# Patient Record
Sex: Male | Born: 1945 | Race: Black or African American | Hispanic: No | Marital: Married | Smoking: Former smoker
Health system: Southern US, Community
[De-identification: ages and names within clinical notes are randomized; demographics above are authoritative.]

## PROBLEM LIST (undated history)

## (undated) DIAGNOSIS — I6522 Occlusion and stenosis of left carotid artery: Secondary | ICD-10-CM

## (undated) DIAGNOSIS — Z85038 Personal history of other malignant neoplasm of large intestine: Secondary | ICD-10-CM

## (undated) DIAGNOSIS — G459 Transient cerebral ischemic attack, unspecified: Secondary | ICD-10-CM

## (undated) DIAGNOSIS — Z9221 Personal history of antineoplastic chemotherapy: Secondary | ICD-10-CM

## (undated) DIAGNOSIS — C61 Malignant neoplasm of prostate: Secondary | ICD-10-CM

## (undated) DIAGNOSIS — E785 Hyperlipidemia, unspecified: Secondary | ICD-10-CM

## (undated) DIAGNOSIS — H409 Unspecified glaucoma: Secondary | ICD-10-CM

## (undated) DIAGNOSIS — K219 Gastro-esophageal reflux disease without esophagitis: Secondary | ICD-10-CM

## (undated) DIAGNOSIS — K5730 Diverticulosis of large intestine without perforation or abscess without bleeding: Secondary | ICD-10-CM

## (undated) DIAGNOSIS — I2510 Atherosclerotic heart disease of native coronary artery without angina pectoris: Secondary | ICD-10-CM

## (undated) DIAGNOSIS — M1712 Unilateral primary osteoarthritis, left knee: Secondary | ICD-10-CM

## (undated) DIAGNOSIS — I10 Essential (primary) hypertension: Secondary | ICD-10-CM

## (undated) DIAGNOSIS — Z923 Personal history of irradiation: Secondary | ICD-10-CM

## (undated) DIAGNOSIS — C189 Malignant neoplasm of colon, unspecified: Secondary | ICD-10-CM

## (undated) HISTORY — PX: COLONOSCOPY: SHX174

## (undated) HISTORY — DX: Hyperlipidemia, unspecified: E78.5

## (undated) HISTORY — DX: Personal history of other malignant neoplasm of large intestine: Z85.038

## (undated) HISTORY — DX: Gastro-esophageal reflux disease without esophagitis: K21.9

## (undated) HISTORY — DX: Unspecified glaucoma: H40.9

## (undated) HISTORY — DX: Personal history of irradiation: Z92.3

## (undated) HISTORY — PX: EUS: SHX5427

## (undated) HISTORY — DX: Essential (primary) hypertension: I10

## (undated) HISTORY — DX: Diverticulosis of large intestine without perforation or abscess without bleeding: K57.30

## (undated) HISTORY — DX: Unilateral primary osteoarthritis, left knee: M17.12

---

## 1998-03-20 DIAGNOSIS — Z9221 Personal history of antineoplastic chemotherapy: Secondary | ICD-10-CM

## 1998-03-20 DIAGNOSIS — C189 Malignant neoplasm of colon, unspecified: Secondary | ICD-10-CM

## 1998-03-20 HISTORY — DX: Personal history of antineoplastic chemotherapy: Z92.21

## 1998-03-20 HISTORY — PX: COLON SURGERY: SHX602

## 1998-03-20 HISTORY — DX: Malignant neoplasm of colon, unspecified: C18.9

## 1998-12-20 ENCOUNTER — Encounter: Payer: Self-pay | Admitting: Family Medicine

## 1998-12-20 ENCOUNTER — Ambulatory Visit (HOSPITAL_COMMUNITY): Admission: RE | Admit: 1998-12-20 | Discharge: 1998-12-20 | Payer: Self-pay | Admitting: Family Medicine

## 1998-12-22 ENCOUNTER — Encounter: Payer: Self-pay | Admitting: Surgery

## 1998-12-23 ENCOUNTER — Ambulatory Visit (HOSPITAL_COMMUNITY): Admission: RE | Admit: 1998-12-23 | Discharge: 1998-12-23 | Payer: Self-pay | Admitting: Family Medicine

## 1998-12-23 ENCOUNTER — Encounter: Payer: Self-pay | Admitting: Family Medicine

## 1998-12-27 ENCOUNTER — Inpatient Hospital Stay (HOSPITAL_COMMUNITY): Admission: RE | Admit: 1998-12-27 | Discharge: 1999-01-02 | Payer: Self-pay | Admitting: Surgery

## 1998-12-27 ENCOUNTER — Encounter (INDEPENDENT_AMBULATORY_CARE_PROVIDER_SITE_OTHER): Payer: Self-pay | Admitting: Specialist

## 1999-02-17 ENCOUNTER — Encounter: Payer: Self-pay | Admitting: Surgery

## 1999-02-17 ENCOUNTER — Ambulatory Visit (HOSPITAL_COMMUNITY): Admission: RE | Admit: 1999-02-17 | Discharge: 1999-02-17 | Payer: Self-pay | Admitting: Surgery

## 1999-08-19 ENCOUNTER — Ambulatory Visit (HOSPITAL_BASED_OUTPATIENT_CLINIC_OR_DEPARTMENT_OTHER): Admission: RE | Admit: 1999-08-19 | Discharge: 1999-08-19 | Payer: Self-pay | Admitting: Surgery

## 1999-11-15 ENCOUNTER — Encounter: Payer: Self-pay | Admitting: Family Medicine

## 1999-11-15 ENCOUNTER — Ambulatory Visit (HOSPITAL_COMMUNITY): Admission: RE | Admit: 1999-11-15 | Discharge: 1999-11-15 | Payer: Self-pay | Admitting: Family Medicine

## 2002-03-05 ENCOUNTER — Encounter: Payer: Self-pay | Admitting: Family Medicine

## 2002-03-05 ENCOUNTER — Ambulatory Visit (HOSPITAL_COMMUNITY): Admission: RE | Admit: 2002-03-05 | Discharge: 2002-03-05 | Payer: Self-pay | Admitting: Family Medicine

## 2004-05-09 ENCOUNTER — Ambulatory Visit: Payer: Self-pay | Admitting: Internal Medicine

## 2004-06-22 ENCOUNTER — Ambulatory Visit: Payer: Self-pay | Admitting: Internal Medicine

## 2004-10-04 ENCOUNTER — Ambulatory Visit: Payer: Self-pay | Admitting: Internal Medicine

## 2004-10-25 ENCOUNTER — Ambulatory Visit: Payer: Self-pay | Admitting: Internal Medicine

## 2005-02-20 ENCOUNTER — Ambulatory Visit: Payer: Self-pay | Admitting: Internal Medicine

## 2005-02-22 ENCOUNTER — Ambulatory Visit: Payer: Self-pay | Admitting: Internal Medicine

## 2005-03-20 HISTORY — PX: KNEE ARTHROSCOPY: SHX127

## 2005-04-27 ENCOUNTER — Ambulatory Visit: Payer: Self-pay | Admitting: Internal Medicine

## 2006-03-01 ENCOUNTER — Ambulatory Visit: Payer: Self-pay | Admitting: Internal Medicine

## 2006-04-24 ENCOUNTER — Ambulatory Visit: Payer: Self-pay | Admitting: Internal Medicine

## 2006-05-18 ENCOUNTER — Ambulatory Visit: Payer: Self-pay | Admitting: Internal Medicine

## 2006-06-04 ENCOUNTER — Ambulatory Visit: Payer: Self-pay | Admitting: Internal Medicine

## 2006-10-12 DIAGNOSIS — Z85038 Personal history of other malignant neoplasm of large intestine: Secondary | ICD-10-CM

## 2006-10-12 DIAGNOSIS — I10 Essential (primary) hypertension: Secondary | ICD-10-CM | POA: Insufficient documentation

## 2007-02-12 ENCOUNTER — Encounter: Payer: Self-pay | Admitting: Internal Medicine

## 2007-02-20 ENCOUNTER — Encounter: Payer: Self-pay | Admitting: Internal Medicine

## 2007-05-15 ENCOUNTER — Ambulatory Visit: Payer: Self-pay | Admitting: Internal Medicine

## 2007-05-15 DIAGNOSIS — E785 Hyperlipidemia, unspecified: Secondary | ICD-10-CM | POA: Insufficient documentation

## 2007-05-15 LAB — CONVERTED CEMR LAB
ALT: 16 units/L (ref 0–53)
Basophils Relative: 0.6 % (ref 0.0–1.0)
Bilirubin Urine: NEGATIVE
Bilirubin, Direct: 0.2 mg/dL (ref 0.0–0.3)
CO2: 33 meq/L — ABNORMAL HIGH (ref 19–32)
Calcium: 9.7 mg/dL (ref 8.4–10.5)
Cholesterol: 238 mg/dL (ref 0–200)
Eosinophils Relative: 3.4 % (ref 0.0–5.0)
GFR calc Af Amer: 79 mL/min
Glucose, Bld: 99 mg/dL (ref 70–99)
HDL: 33.7 mg/dL — ABNORMAL LOW (ref >39.0)
Hemoglobin, Urine: NEGATIVE
Ketones, ur: NEGATIVE mg/dL
Monocytes Absolute: 0.4 10*3/uL (ref 0.2–0.7)
Neutro Abs: 4.7 10*3/uL (ref 1.4–7.7)
Neutrophils Relative %: 60.5 % (ref 43.0–77.0)
Potassium: 3.7 meq/L (ref 3.5–5.1)
Sodium: 142 meq/L (ref 135–145)
TSH: 2.45 microintl units/mL (ref 0.35–5.50)
Total CHOL/HDL Ratio: 7.1
Total Protein: 6.9 g/dL (ref 6.0–8.3)
Triglycerides: 146 mg/dL (ref 0–149)
Urine Glucose: NEGATIVE mg/dL
Urobilinogen, UA: 0.2 (ref 0.0–1.0)
VLDL: 29 mg/dL (ref 0–40)
pH: 5.5 (ref 5.0–8.0)

## 2007-06-07 ENCOUNTER — Ambulatory Visit: Payer: Self-pay | Admitting: Internal Medicine

## 2007-06-18 ENCOUNTER — Encounter: Payer: Self-pay | Admitting: Internal Medicine

## 2007-06-18 ENCOUNTER — Ambulatory Visit: Payer: Self-pay | Admitting: Internal Medicine

## 2007-08-13 ENCOUNTER — Ambulatory Visit: Payer: Self-pay | Admitting: Internal Medicine

## 2007-08-13 DIAGNOSIS — J019 Acute sinusitis, unspecified: Secondary | ICD-10-CM

## 2007-09-09 ENCOUNTER — Ambulatory Visit: Payer: Self-pay | Admitting: Internal Medicine

## 2008-05-20 ENCOUNTER — Ambulatory Visit: Payer: Self-pay | Admitting: Internal Medicine

## 2008-05-20 DIAGNOSIS — J069 Acute upper respiratory infection, unspecified: Secondary | ICD-10-CM | POA: Insufficient documentation

## 2008-05-20 DIAGNOSIS — M25519 Pain in unspecified shoulder: Secondary | ICD-10-CM

## 2008-05-20 LAB — CONVERTED CEMR LAB
AST: 21 units/L (ref 0–37)
Albumin: 3.9 g/dL (ref 3.5–5.2)
Alkaline Phosphatase: 84 units/L (ref 39–117)
BUN: 17 mg/dL (ref 6–23)
Bacteria, UA: NEGATIVE
Bilirubin, Direct: 0.1 mg/dL (ref 0.0–0.3)
Chloride: 102 meq/L (ref 96–112)
Eosinophils Relative: 2.1 % (ref 0.0–5.0)
GFR calc non Af Amer: 72 mL/min
Glucose, Bld: 100 mg/dL — ABNORMAL HIGH (ref 70–99)
Lymphocytes Relative: 24.2 % (ref 12.0–46.0)
Monocytes Relative: 4.2 % (ref 3.0–12.0)
Mucus, UA: NEGATIVE
Neutrophils Relative %: 69.5 % (ref 43.0–77.0)
Nitrite: NEGATIVE
Platelets: 214 10*3/uL (ref 150–400)
Potassium: 3.3 meq/L — ABNORMAL LOW (ref 3.5–5.1)
Total Protein, Urine: NEGATIVE mg/dL
Total Protein: 8 g/dL (ref 6.0–8.3)
Urobilinogen, UA: 0.2 (ref 0.0–1.0)
VLDL: 16 mg/dL (ref 0–40)
WBC: 9 10*3/uL (ref 4.5–10.5)

## 2008-07-06 ENCOUNTER — Ambulatory Visit: Payer: Self-pay | Admitting: Internal Medicine

## 2008-07-06 DIAGNOSIS — M25559 Pain in unspecified hip: Secondary | ICD-10-CM

## 2008-07-06 DIAGNOSIS — M549 Dorsalgia, unspecified: Secondary | ICD-10-CM | POA: Insufficient documentation

## 2008-07-06 LAB — CONVERTED CEMR LAB
Albumin: 3.7 g/dL (ref 3.5–5.2)
Alkaline Phosphatase: 87 units/L (ref 39–117)
LDL Cholesterol: 97 mg/dL (ref 0–99)
Total CHOL/HDL Ratio: 4
Triglycerides: 94 mg/dL (ref 0.0–149.0)
VLDL: 18.8 mg/dL (ref 0.0–40.0)

## 2008-07-24 ENCOUNTER — Telehealth (INDEPENDENT_AMBULATORY_CARE_PROVIDER_SITE_OTHER): Payer: Self-pay | Admitting: *Deleted

## 2009-04-30 ENCOUNTER — Ambulatory Visit: Payer: Self-pay | Admitting: Internal Medicine

## 2009-04-30 DIAGNOSIS — R1011 Right upper quadrant pain: Secondary | ICD-10-CM

## 2009-05-04 ENCOUNTER — Encounter (INDEPENDENT_AMBULATORY_CARE_PROVIDER_SITE_OTHER): Payer: Self-pay | Admitting: *Deleted

## 2009-05-04 ENCOUNTER — Ambulatory Visit: Payer: Self-pay | Admitting: Internal Medicine

## 2009-05-04 DIAGNOSIS — K862 Cyst of pancreas: Secondary | ICD-10-CM | POA: Insufficient documentation

## 2009-05-04 DIAGNOSIS — K863 Pseudocyst of pancreas: Secondary | ICD-10-CM

## 2009-05-04 DIAGNOSIS — R1084 Generalized abdominal pain: Secondary | ICD-10-CM | POA: Insufficient documentation

## 2009-05-05 ENCOUNTER — Telehealth: Payer: Self-pay | Admitting: Internal Medicine

## 2009-05-05 LAB — CONVERTED CEMR LAB
ALT: 19 units/L (ref 0–53)
Albumin: 4.1 g/dL (ref 3.5–5.2)
Amylase: 51 units/L (ref 27–131)
Basophils Relative: 0.4 % (ref 0.0–3.0)
CO2: 33 meq/L — ABNORMAL HIGH (ref 19–32)
Chloride: 103 meq/L (ref 96–112)
Creatinine, Ser: 1.3 mg/dL (ref 0.4–1.5)
Eosinophils Absolute: 0.1 10*3/uL (ref 0.0–0.7)
Eosinophils Relative: 1.5 % (ref 0.0–5.0)
HCT: 47.4 % (ref 39.0–52.0)
Hemoglobin: 15.6 g/dL (ref 13.0–17.0)
Leukocytes, UA: NEGATIVE
Lymphs Abs: 2.4 10*3/uL (ref 0.7–4.0)
MCHC: 32.9 g/dL (ref 30.0–36.0)
MCV: 82.2 fL (ref 78.0–100.0)
Monocytes Absolute: 0.5 10*3/uL (ref 0.1–1.0)
Neutro Abs: 4.3 10*3/uL (ref 1.4–7.7)
Neutrophils Relative %: 59.3 % (ref 43.0–77.0)
Nitrite: NEGATIVE
Potassium: 3 meq/L — ABNORMAL LOW (ref 3.5–5.1)
RBC: 5.77 M/uL (ref 4.22–5.81)
Total Protein: 7.9 g/dL (ref 6.0–8.3)
WBC: 7.3 10*3/uL (ref 4.5–10.5)
pH: 6.5 (ref 5.0–8.0)

## 2009-05-07 ENCOUNTER — Telehealth: Payer: Self-pay | Admitting: Internal Medicine

## 2009-05-08 ENCOUNTER — Ambulatory Visit (HOSPITAL_COMMUNITY): Admission: RE | Admit: 2009-05-08 | Discharge: 2009-05-08 | Payer: Self-pay | Admitting: Internal Medicine

## 2009-05-11 ENCOUNTER — Telehealth: Payer: Self-pay | Admitting: Internal Medicine

## 2009-05-11 ENCOUNTER — Ambulatory Visit: Payer: Self-pay | Admitting: Internal Medicine

## 2009-05-11 DIAGNOSIS — B029 Zoster without complications: Secondary | ICD-10-CM | POA: Insufficient documentation

## 2009-05-12 ENCOUNTER — Telehealth (INDEPENDENT_AMBULATORY_CARE_PROVIDER_SITE_OTHER): Payer: Self-pay | Admitting: *Deleted

## 2009-05-13 ENCOUNTER — Telehealth: Payer: Self-pay | Admitting: Internal Medicine

## 2009-06-07 ENCOUNTER — Ambulatory Visit: Payer: Self-pay | Admitting: Internal Medicine

## 2009-06-08 ENCOUNTER — Ambulatory Visit: Payer: Self-pay | Admitting: Internal Medicine

## 2009-06-08 ENCOUNTER — Telehealth (INDEPENDENT_AMBULATORY_CARE_PROVIDER_SITE_OTHER): Payer: Self-pay | Admitting: *Deleted

## 2009-06-08 DIAGNOSIS — E876 Hypokalemia: Secondary | ICD-10-CM

## 2009-06-08 DIAGNOSIS — B0229 Other postherpetic nervous system involvement: Secondary | ICD-10-CM

## 2009-06-10 ENCOUNTER — Encounter (INDEPENDENT_AMBULATORY_CARE_PROVIDER_SITE_OTHER): Payer: Self-pay | Admitting: *Deleted

## 2009-06-10 ENCOUNTER — Telehealth (INDEPENDENT_AMBULATORY_CARE_PROVIDER_SITE_OTHER): Payer: Self-pay | Admitting: *Deleted

## 2009-06-17 ENCOUNTER — Ambulatory Visit: Payer: Self-pay | Admitting: Gastroenterology

## 2009-06-17 ENCOUNTER — Ambulatory Visit (HOSPITAL_COMMUNITY): Admission: RE | Admit: 2009-06-17 | Discharge: 2009-06-17 | Payer: Self-pay | Admitting: Gastroenterology

## 2009-06-18 ENCOUNTER — Encounter: Payer: Self-pay | Admitting: Gastroenterology

## 2010-03-25 ENCOUNTER — Telehealth: Payer: Self-pay | Admitting: Internal Medicine

## 2010-04-17 LAB — CONVERTED CEMR LAB
Cholesterol: 132 mg/dL (ref 0–200)
LDL Cholesterol: 71 mg/dL (ref 0–99)
PSA: 2.19 ng/mL (ref 0.10–4.00)
Triglycerides: 132 mg/dL (ref 0.0–149.0)
VLDL: 26.4 mg/dL (ref 0.0–40.0)

## 2010-04-19 NOTE — Assessment & Plan Note (Signed)
Summary: ABD PAIN, OFFERED SDA - REQ 2/11/CD   Vital Signs:  Patient profile:   65 year old male Height:      65.5 inches Weight:      271.50 pounds BMI:     44.65 O2 Sat:      97 % on Room air Temp:     97.5 degrees F oral Pulse rate:   77 / minute BP sitting:   128 / 62  (left arm) Cuff size:   large  Vitals Entered ByZella Ball Ewing (April 30, 2009 8:23 AM)  O2 Flow:  Room air  CC: abdominal pain, flu shot/RE   CC:  abdominal pain and flu shot/RE.  History of Present Illness: here with 1 wk pain to the irght side mid abd and RUQ, seems to radiate towards the back but not all the way, dull, moderate, lasts 4 to 5 hrs at a time;  thought it was gas and tried alka seltzer at [place] and not help;  no n/v, fever,  has been irregular more than usuall with decreased freq of BM but o/w does not think he has constipation;  no BRBPR;  no fever, wt loss, although appetite has been down - yesterday only had 2 pop sickles,  rare alcohol only but did have a few  beers at the super bowl party feb 6;  takes the nsaid on regular basis  - one per day;  eating or position change does not seem to make the pain better or worse; tramadol  in the AM seems to help such that pain to the abd only symptomatic later in theday;  still taking the statin every day - taken for over a yr iwthout previous GI complaints;  has not tried any other antacid;  last CT - cant remember the date;  has hx of colon cancer approx 100 yrs - s/p partial coloectomy.  last colonoscopy just mar 2010 - due for f/u 2013.  Still has GB, has diverticulsi of colon but no hx of diverticulitis.  Pt denies CP, sob, doe, wheezing, orthopnea, pnd, worsening LE edema, palps, dizziness or syncope  Pt denies new neuro symptoms such as headache, facial or extremity weakness   Problems Prior to Update: 1)  Abdominal Pain Right Upper Quadrant  (ICD-789.01) 2)  Hepatotoxicity, Drug-induced, Risk of  (ICD-V58.69) 3)  Hip Pain, Right   (ICD-719.45) 4)  Back Pain  (ICD-724.5) 5)  Shoulder Pain, Right  (ICD-719.41) 6)  Uri  (ICD-465.9) 7)  Preventive Health Care  (ICD-V70.0) 8)  Sinusitis- Acute-nos  (ICD-461.9) 9)  Preventive Health Care  (ICD-V70.0) 10)  Diverticulosis, Colon  (ICD-562.10) 11)  Hyperlipidemia  (ICD-272.4) 12)  Hypertension  (ICD-401.9) 13)  Colon Cancer, Hx of  (ICD-V10.05)  Medications Prior to Update: 1)  Diclofenac Sodium 75 Mg Tbec (Diclofenac Sodium) .... Take 1 Tab By Mouth Two Times A Day As Needed Pain 2)  Hydrochlorothiazide 25 Mg Tabs (Hydrochlorothiazide) .... Take 1 Tablet By Mouth Once A Day 3)  Pravachol 40 Mg  Tabs (Pravastatin Sodium) .... 2 By Mouth Once Daily 4)  Benazepril Hcl 40 Mg  Tabs (Benazepril Hcl) .Marland Kitchen.. 1po Once Daily 5)  Amlodipine Besylate 10 Mg  Tabs (Amlodipine Besylate) .Marland Kitchen.. 1po Once Daily 6)  Tramadol Hcl 50 Mg  Tabs (Tramadol Hcl) .Marland Kitchen.. 1 - 2 By Mouth Q 6 Hrs As Needed Pain 7)  Adult Aspirin Ec Low Strength 81 Mg Tbec (Aspirin) .Marland Kitchen.. 1 By Mouth Once Daily  Current Medications (  verified): 1)  Diclofenac Sodium 75 Mg Tbec (Diclofenac Sodium) .... Take 1 Tab By Mouth Two Times A Day As Needed Pain 2)  Hydrochlorothiazide 25 Mg Tabs (Hydrochlorothiazide) .... Take 1 Tablet By Mouth Once A Day 3)  Pravachol 40 Mg  Tabs (Pravastatin Sodium) .... 2 By Mouth Once Daily 4)  Benazepril Hcl 40 Mg  Tabs (Benazepril Hcl) .Marland Kitchen.. 1po Once Daily 5)  Amlodipine Besylate 10 Mg  Tabs (Amlodipine Besylate) .Marland Kitchen.. 1po Once Daily 6)  Tramadol Hcl 50 Mg  Tabs (Tramadol Hcl) .Marland Kitchen.. 1 - 2 By Mouth Q 6 Hrs As Needed Pain 7)  Adult Aspirin Ec Low Strength 81 Mg Tbec (Aspirin) .Marland Kitchen.. 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   Head:  normocephalic and atraumatic.   Eyes:  vision grossly intact, pupils equal, and pupils round.   Ears:  R ear normal and L ear normal.   Nose:  no external deformity and no nasal discharge.   Mouth:  no gingival abnormalities and pharynx pink and moist.   Neck:  supple and no masses.   Lungs:  normal respiratory effort and normal breath sounds.   Heart:  normal rate and regular rhythm.   Abdomen:  soft, non-tender, normal bowel sounds, no distention, no masses, no guarding, and no hepatomegaly.   Msk:  no joint tenderness and no joint swelling.   Extremities:  no edema, no erythema    Impression & Recommendations:  Problem # 1:  HYPERTENSION (ICD-401.9)  His updated medication list for this problem includes:    Hydrochlorothiazide 25 Mg Tabs (Hydrochlorothiazide) .Marland Kitchen... Take 1 tablet by mouth once a day    Benazepril Hcl 40 Mg Tabs (Benazepril hcl) .Marland Kitchen... 1po once daily    Amlodipine Besylate 10 Mg Tabs (Amlodipine besylate) .Marland Kitchen... 1po once daily  BP today: 128/62 Prior BP: 142/90 (07/06/2008)  Labs Reviewed: K+: 3.3 (05/20/2008) Creat: : 1.1 (05/20/2008)   Chol: 150 (07/06/2008)   HDL: 34.20 (07/06/2008)   LDL: 97 (07/06/2008)   TG: 94.0 (07/06/2008) stable overall by hx and exam, ok to continue meds/tx as is   Problem # 2:  ABDOMINAL PAIN RIGHT UPPER QUADRANT (ICD-789.01) and right mid side; exam completly benign, afebrile; has tolerated nsaid for many yrs well  - will cont as is for now;  I suspect some bowel irreg ? constipation with the tramadol - to hold the tramadol for now;  also try laxative, and also for trial will give sample PPI to  see if has nay improvement; consider CT and /or labs if does not improve in  3 to 4 days  Problem # 3:  COLON CANCER, HX OF (ICD-V10.05) consider ct for any persistent or worsening s/s  Complete Medication List: 1)  Diclofenac Sodium 75 Mg Tbec (Diclofenac sodium) .... Take 1 tab by mouth two times a day as needed pain 2)  Hydrochlorothiazide 25 Mg Tabs (Hydrochlorothiazide) .... Take 1 tablet by  mouth once a day 3)  Pravachol 40 Mg Tabs (Pravastatin sodium) .... 2 by mouth once daily 4)  Benazepril Hcl 40 Mg Tabs (Benazepril hcl) .Marland Kitchen.. 1po once daily 5)  Amlodipine Besylate 10 Mg Tabs (Amlodipine besylate) .Marland Kitchen.. 1po once daily 6)  Tramadol Hcl 50 Mg Tabs (Tramadol hcl) .Marland Kitchen.. 1 - 2 by mouth q 6 hrs as needed pain 7)  Adult Aspirin Ec Low Strength 81 Mg Tbec (Aspirin) .Marland Kitchen.. 1 by mouth once daily  Other Orders: Flu Vaccine 15yrs + (16109) Administration Flu vaccine - MCR (U0454)  Patient Instructions: 1)  please hold on taking the tramadol for a few days 2)  please try magnesium citrate - 1 bottle by mouth x 1 today for laxative trial 3)  please take the Prilosec 20 mg OTC sample for antacid trail 4)  please call in 3 to 4 days if not improved, as we will need to consider CT and blood work 5)  you had the flu shot today 6)  Please schedule a follow-up appointment in 1 month for your "yearly exam"     Flu Vaccine Consent Questions     Do you have a history of severe allergic reactions to this vaccine? no    Any prior history of allergic reactions to egg and/or gelatin? no    Do you have a sensitivity to the preservative Thimersol? no    Do you have a past history of Guillan-Barre Syndrome? no    Do you currently have an acute febrile illness? no    Have you ever had a severe reaction to latex? no    Vaccine information given and explained to patient? yes    Are you currently pregnant? no    Lot Number:[id number]   Exp Date:09/16/2009   Site Given  Left Deltoid IMflu [facility]  Screening2-CCC]

## 2010-04-19 NOTE — Progress Notes (Signed)
Summary: Abd MRI  Phone Note From Other Clinic   Caller: Receptionist [phone] Lurena Joiner Summary of Call: [facility] called stating that pt is scheduled for Abd MRI w/o contrast. Lurena Joiner says that MRI needs to be with and without contrast and a recent Creatinine level needs to be sent over as well. MRI says they will need a new order.  Initial call taken by: Margaret Pyle, CMA,  May 07, 2009 8:30 AM  Follow-up for Phone Call        just had recent labs so does not need to be done again - ok to send labs from feb 15 (to pcc's to handle);  i will change order for the MRI Follow-up by: Corwin Levins MD,  May 07, 2009 9:43 AM  Additional Follow-up for Phone Call Additional follow up Details #1::        faxed order and labs to [facility] [phone]  Additional Follow-up by: Shelbie Proctor,  May 07, 2009 11:36 AM    Additional Follow-up for Phone Call Additional follow up Details #2::    noted Follow-up by: Corwin Levins MD,  May 07, 2009 1:00 PM

## 2010-04-19 NOTE — Assessment & Plan Note (Signed)
Summary: YEARLY FU / MEDICARE / LABS AFTER /NWS  #   Vital Signs:  Patient profile:   65 year old male Height:      66 inches Weight:      269.38 pounds BMI:     43.64 O2 Sat:      97 % on Room air Temp:     97.5 degrees F oral Pulse rate:   83 / minute BP sitting:   140 / 60  (left arm) Cuff size:   large  Vitals Entered ByZella Ball Ewing (June 08, 2009 8:40 AM)  O2 Flow:  Room air  CC: Yearly/RE   Primary Care Provider:  Corwin Levins MD  CC:  Ulla Potash.  History of Present Illness: here to f/u - stopped the pravachol for a time, laxative seemed to help, so re-started the pravachol;  did take the K supp, but then stopped after "got all right."   May need GI procedure related to the ? pancreatic cyst;  still c/o pain to the PHN area  - has now been approx just over a month;  has  tried lyrica in the past but could not tolerate;  Pt denies CP, sob, doe, wheezing, orthopnea, pnd, worsening LE edema, palps, dizziness or syncope   Pt denies new neuro symptoms such as headache, facial or extremity weakness  hard to lose wt.  Has some fatigue, but no OSA symtpoms at this time  Here for wellness Diet: Heart Healthy or DM if diabetic Physical Activities: Sedentary Depression/mood screen: Negative Hearing: Intact bilateral 'for the most part" with slight loss only in one ear  Visual Acuity: Grossly normal, wears reading glasses, sees optho yearly  ADL's: Capable  Fall Risk: None Home Safety: Good End-of-Life Planning: Advance directive - Full code/I agree   Preventive Screening-Counseling & Management      Drug Use:  no.    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) 3)  Abdominal Pain, Generalized  (ICD-789.07) 4)  Abdominal Pain Right Upper Quadrant  (ICD-789.01) 5)  Hepatotoxicity, Drug-induced, Risk of  (ICD-V58.69) 6)  Hip Pain, Right  (ICD-719.45) 7)  Back Pain  (ICD-724.5) 8)  Shoulder Pain, Right   (ICD-719.41) 9)  Uri  (ICD-465.9) 10)  Preventive Health Care  (ICD-V70.0) 11)  Sinusitis- Acute-nos  (ICD-461.9) 12)  Preventive Health Care  (ICD-V70.0) 13)  Diverticulosis, Colon  (ICD-562.10) 14)  Hyperlipidemia  (ICD-272.4) 15)  Hypertension  (ICD-401.9) 16)  Colon Cancer, Hx of  (ICD-V10.05)  Medications Prior to Update: 1)  Diclofenac Sodium 75 Mg Tbec (Diclofenac Sodium) .... Take 1 Tab By Mouth Two Times A Day As Needed Pain 2)  Hydrochlorothiazide 25 Mg Tabs (Hydrochlorothiazide) .... Take 1 Tablet By Mouth Once A Day 3)  Pravachol 40 Mg  Tabs (Pravastatin Sodium) .... 2 By Mouth Once Daily 4)  Benazepril Hcl 40 Mg  Tabs (Benazepril Hcl) .Marland Kitchen.. 1po Once Daily 5)  Amlodipine Besylate 10 Mg  Tabs (Amlodipine Besylate) .Marland Kitchen.. 1po Once Daily 6)  Tramadol Hcl 50 Mg  Tabs (Tramadol Hcl) .Marland Kitchen.. 1 - 2 By Mouth Q 6 Hrs As Needed Pain 7)  Adult Aspirin Ec Low Strength 81 Mg Tbec (Aspirin) .Marland Kitchen.. 1 By Mouth Once Daily  Current Medications (verified): 1)  Diclofenac Sodium 75 Mg Tbec (Diclofenac Sodium) .... Take 1 Tab By Mouth Two Times A Day As Needed Pain 2)  Hydrochlorothiazide 25 Mg Tabs (Hydrochlorothiazide) .... Take 1 Tablet  By Mouth Once A Day 3)  Pravachol 40 Mg  Tabs (Pravastatin Sodium) .... 2 By Mouth Once Daily 4)  Benazepril Hcl 40 Mg  Tabs (Benazepril Hcl) .Marland Kitchen.. 1po Once Daily 5)  Amlodipine Besylate 10 Mg  Tabs (Amlodipine Besylate) .Marland Kitchen.. 1po Once Daily 6)  Tramadol Hcl 50 Mg  Tabs (Tramadol Hcl) .Marland Kitchen.. 1 - 2 By Mouth Q 6 Hrs As Needed Pain 7)  Adult Aspirin Ec Low Strength 81 Mg Tbec (Aspirin) .Marland Kitchen.. 1 By Mouth Once Daily 8)  Gabapentin 300 Mg Caps (Gabapentin) .Marland Kitchen.. 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 brother with lung cancer brother with DM mother had liver cancer grandmother with breast cancer  Social History: Reviewed history from 05/15/2007 and no changes required. 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         all otherwise negative per pt -    Physical Exam  General:  alert and overweight-appearing.   Head:  normocephalic and atraumatic.   Eyes:  vision grossly intact, pupils equal, and pupils round.   Ears:  R ear normal and L ear normal.   Nose:  no external deformity and no nasal discharge.   Mouth:  no gingival abnormalities and pharynx pink and moist.   Neck:  supple and no masses.   Lungs:  normal respiratory effort and normal breath sounds.   Heart:  normal rate and regular rhythm.   Abdomen:  soft, non-tender, and normal bowel sounds.   Msk:  no joint tenderness and no joint swelling.     Extremities:  no edema, no erythema  Neurologic:  cranial nerves II-XII intact and strength normal in all extremities.     Impression & Recommendations:  Problem # 1:  Preventive Health Care (ICD-V70.0)  Overall doing well, age appropriate education and counseling updated and referral for appropriate preventive services done unless declined, immunizations up to date or declined, diet counseling done if overweight, urged to quit smoking if smokes , most recent labs reviewed and current ordered if appropriate, ecg reviewed or declined (interpretation per ECG scanned in the EMR if done); information regarding Medicare Prevention requirements given if appropriate ; noted no AAA on recent CT in former male smoker > 60  Orders: First annual wellness visit with prevention plan  (Z6109)  Problem # 2:  HYPOKALEMIA (ICD-276.8) i suspect  may have been related to the GI problem recent, and likely to recur on the hctz, but hs ie  relucant to take more meds;  will follow for now, re-check K   Problem # 3:  POSTHERPETIC NEURALGIA (ICD-053.19)  ok for gabapentin asd  Orders: Prescription Created Electronically [phone])  Problem # 4:  HYPERTENSION (ICD-401.9)  His updated medication list for this problem includes:    Hydrochlorothiazide 25 Mg Tabs (Hydrochlorothiazide) .Marland Kitchen... Take 1 tablet by mouth once a day    Benazepril Hcl 40 Mg Tabs (Benazepril hcl) .Marland Kitchen... 1po once daily    Amlodipine Besylate 10 Mg Tabs (Amlodipine besylate) .Marland Kitchen... 1po once daily  BP today: 140/60 Prior BP: 130/68 (05/11/2009)  Labs Reviewed: K+: 3.0 (05/04/2009) Creat: : 1.3 (05/04/2009)   Chol: 150 (07/06/2008)   HDL: 34.20 (07/06/2008)   LDL: 97 (07/06/2008)   TG: 94.0 (07/06/2008)  Orders: EKG w/ Interpretation (93000)  Problem # 5:  HYPERLIPIDEMIA (ICD-272.4)  His updated medication list for this problem includes:    Pravachol 40 Mg Tabs (Pravastatin sodium) .Marland Kitchen... 2 by mouth once daily  Orders: TLB-Lipid Panel  (80061-LIPID)  Labs Reviewed: SGOT: 29 (05/04/2009)   SGPT: 19 (05/04/2009)   HDL:34.20 (07/06/2008), 38.5 (05/20/2008)  LDL:97 (07/06/2008), 143 (60/45/4098)  Chol:150 (07/06/2008), 198 (05/20/2008)  Trig:94.0 (07/06/2008), 81 (05/20/2008) stable overall by hx and exam, ok to continue meds/tx as is - due for lipids check, goal ldl less than 70, cont diet  Complete Medication List: 1)  Diclofenac Sodium 75 Mg Tbec (Diclofenac sodium) .... Take 1 tab by mouth two times a day as needed pain 2)  Hydrochlorothiazide 25 Mg Tabs (Hydrochlorothiazide) .... Take 1 tablet by mouth once a day 3)  Pravachol 40 Mg Tabs (Pravastatin sodium) .... 2 by mouth once daily 4)  Benazepril Hcl 40 Mg Tabs (Benazepril hcl) .Marland Kitchen.. 1po once daily 5)  Amlodipine Besylate 10 Mg Tabs (Amlodipine besylate) .Marland Kitchen.. 1po once daily 6)  Tramadol Hcl 50 Mg Tabs (Tramadol hcl) .Marland Kitchen.. 1 - 2 by mouth q 6 hrs as needed pain 7)  Adult Aspirin Ec Low Strength 81 Mg Tbec (Aspirin) .Marland Kitchen.. 1 by mouth once daily 8)  Gabapentin 300 Mg Caps (Gabapentin) .Marland Kitchen.. 1 by mouth three times a day  Other Orders: Radiology Referral (Radiology) TLB-PSA (Prostate Specific Antigen) (84153-PSA)  Patient Instructions: 1)  Please take all new medications as prescribed - start the gabapentin at one per night for 3 nights, then twice per day for 3 days, then three times per day after that, watching for sleepiness 2)  Please go to the Lab in the basement for your blood and/or urine tests today  - the PSA and lipids 3)  you had the pneumonia shot today 4)  Please schedule a follow-up appointment in 1 year or sooner if needed Prescriptions: GABAPENTIN 300 MG CAPS (GABAPENTIN) 1 by mouth three times a day  #90 x 5   Entered and Authorized by:   Corwin Levins MD   Signed by:   Corwin Levins MD on 06/08/2009   Method used:   Print then Give to Patient   RxID:   [id number]     Appended Document: Immunization Entry      Immunizations  Administered:  Pneumonia Vaccine:    Vaccine Type: Pneumovax    Site: right deltoid    Mfr: Merck    Dose: 0.5 ml    Route: IM    Given by: Zella Ball Ewing    Exp. Date: 11/01/2010  Lot #: [id number]    VIS given: 10/16/95 version given June 08, 2009.

## 2010-04-19 NOTE — Miscellaneous (Signed)
Summary: Orders Update   Clinical Lists Changes  Problems: Added new problem of NONSPCIFC ABN FINDING RAD & OTH EXAM LUNG FIELD (ICD-793.1) Orders: Added new Referral order of Radiology Referral (Radiology) - Signed

## 2010-04-19 NOTE — Progress Notes (Signed)
Summary: ALT RX  Phone Note Call from Patient   Complaint: Nausea/Vomiting/Diarrhea Summary of Call: Med given today is >100$. Patient is requesting less expensive alternative. (acyclovir 200mg  #30 or #90  is on 4$ [facility] list, is this an option?) Initial call taken by: Lamar Sprinkles, CMA,  May 11, 2009 3:47 PM  Follow-up for Phone Call        done Follow-up by: Etta Grandchild MD,  May 11, 2009 7:38 PM    New/Updated Medications: ACYCLOVIR 800 MG TABS (ACYCLOVIR) One by mouth three times a day for 10 days Prescriptions: ACYCLOVIR 800 MG TABS (ACYCLOVIR) One by mouth three times a day for 10 days  #30 x 1   Entered and Authorized by:   Etta Grandchild MD   Signed by:   Etta Grandchild MD on 05/11/2009   Method used:   Electronically to        [facility]  [address] [phone]* (retail)       [address]       [place], [place]  [address]       Ph: [phone]       Fax: [phone]   RxID:   [id number]

## 2010-04-19 NOTE — Progress Notes (Signed)
Summary: Change rx request  Phone Note Other Incoming   Summary of Call: Patient left message on triage that he went to pick up Valtrex and it will cost $271. The pharmacist told him that Acyclovir would be cheaper, and the patient would like to know if he can have an prescription for that instead. Please advise. Initial call taken by: Lucious Groves,  May 12, 2009 12:55 PM Summary of Call: Informed pt it was sent into pharmacy. Initial call taken by: Josph Macho RMA,  May 13, 2009 9:00 AM  Follow-up for Phone Call        already addressed feb 22 per dr Yetta Barre Follow-up by: Corwin Levins MD,  May 12, 2009 1:12 PM  Additional Follow-up for Phone Call Additional follow up Details #1::        left message on machine to call back to office. Additional Follow-up by: Lucious Groves,  May 12, 2009 2:28 PM

## 2010-04-19 NOTE — Assessment & Plan Note (Signed)
Summary: STOMACH PROBLEM STILL-STC   Vital Signs:  Patient profile:   65 year old male Height:      66 inches Weight:      267.75 pounds BMI:     43.37 O2 Sat:      98 % on Room air Temp:     97.6 degrees F oral Pulse rate:   64 / minute BP sitting:   122 / 80  (left arm) Cuff size:   large  Vitals Entered ByZella Ball Ewing (May 04, 2009 11:44 AM)  O2 Flow:  Room air CC: stomach problems/RE   CC:  stomach problems/RE.  History of Present Illness: here to f/u - laxative did seem to help, but still wtih marked discomfort essentially not resolved, moderate, "gas like" with loud bowel sounds,  decreased appetite and wakes him up night;  pain itself now located to the right flank area and radiating towards the right mid abd, still denies fever, n/v, change in bowel habits, last BM yesterday, passing gas but little per pt and may be decreased;  stil good compliacne with meds;  no new symptoms and Pt denies CP, sob, doe, wheezing, orthopnea, pnd, worsening LE edema, palps, dizziness or syncope   Pt denies new neuro symptoms such as headache, facial or extremity weakness   no blood but stool hard.  Denies new GU symptoms such as freq, urgency, blood.  He is very concerned with his hx of colon cancer  Problems Prior to Update: 1)  Abdominal Pain, Generalized  (ICD-789.07) 2)  Abdominal Pain Right Upper Quadrant  (ICD-789.01) 3)  Hepatotoxicity, Drug-induced, Risk of  (ICD-V58.69) 4)  Hip Pain, Right  (ICD-719.45) 5)  Back Pain  (ICD-724.5) 6)  Shoulder Pain, Right  (ICD-719.41) 7)  Uri  (ICD-465.9) 8)  Preventive Health Care  (ICD-V70.0) 9)  Sinusitis- Acute-nos  (ICD-461.9) 10)  Preventive Health Care  (ICD-V70.0) 11)  Diverticulosis, Colon  (ICD-562.10) 12)  Hyperlipidemia  (ICD-272.4) 13)  Hypertension  (ICD-401.9) 14)  Colon Cancer, Hx of  (ICD-V10.05)  Medications Prior to Update: 1)  Diclofenac Sodium 75 Mg Tbec (Diclofenac Sodium) .... Take 1 Tab By Mouth Two Times A Day  As Needed Pain 2)  Hydrochlorothiazide 25 Mg Tabs (Hydrochlorothiazide) .... Take 1 Tablet By Mouth Once A Day 3)  Pravachol 40 Mg  Tabs (Pravastatin Sodium) .... 2 By Mouth Once Daily 4)  Benazepril Hcl 40 Mg  Tabs (Benazepril Hcl) .Marland Kitchen.. 1po Once Daily 5)  Amlodipine Besylate 10 Mg  Tabs (Amlodipine Besylate) .Marland Kitchen.. 1po Once Daily 6)  Tramadol Hcl 50 Mg  Tabs (Tramadol Hcl) .Marland Kitchen.. 1 - 2 By Mouth Q 6 Hrs As Needed Pain 7)  Adult Aspirin Ec Low Strength 81 Mg Tbec (Aspirin) .Marland Kitchen.. 1 By Mouth Once Daily  Current Medications (verified): 1)  Diclofenac Sodium 75 Mg Tbec (Diclofenac Sodium) .... Take 1 Tab By Mouth Two Times A Day As Needed Pain 2)  Hydrochlorothiazide 25 Mg Tabs (Hydrochlorothiazide) .... Take 1 Tablet By Mouth Once A Day 3)  Pravachol 40 Mg  Tabs (Pravastatin Sodium) .... 2 By Mouth Once Daily 4)  Benazepril Hcl 40 Mg  Tabs (Benazepril Hcl) .Marland Kitchen.. 1po Once Daily 5)  Amlodipine Besylate 10 Mg  Tabs (Amlodipine Besylate) .Marland Kitchen.. 1po Once Daily 6)  Tramadol Hcl 50 Mg  Tabs (Tramadol Hcl) .Marland Kitchen.. 1 - 2 By Mouth Q 6 Hrs As Needed Pain 7)  Adult Aspirin Ec Low Strength 81 Mg Tbec (Aspirin) .Marland Kitchen.. 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   Head:  normocephalic and atraumatic.   Eyes:  vision grossly intact, pupils equal, and pupils round.   Ears:  R ear normal and L ear normal.   Nose:  no  external deformity and no nasal discharge.   Mouth:  no gingival abnormalities and pharynx pink and moist.   Neck:  supple and no masses.   Lungs:  normal respiratory effort and normal breath sounds.   Heart:  normal rate and regular rhythm.   Abdomen:  soft, non-tender, and normal bowel sounds.  , nondistaned, no flank tender Msk:  no flank or spinal tender or sweling or rash Extremities:  no edema, no erythema    Impression & Recommendations:  Problem # 1:  ABDOMINAL PAIN, GENERALIZED (ICD-789.07)  more gen'd today but mostly right flank with some radation to the right abd per pt, ? some improved with laxative, but persists overall- cant r/o renal stone, recurrent maliganancy, GB,  or other - to check labs, and CT with CM - , cont same meds for now, also refer GI; stop the pravachol for now in  case this is contributing to symptoms although it has not caused problem in the past  Orders: TLB-BMP (Basic Metabolic Panel-BMET) (80048-METABOL) TLB-CBC Platelet - w/Differential (85025-CBCD) TLB-Hepatic/Liver Function Pnl (80076-HEPATIC) TLB-Lipase (83690-LIPASE) TLB-Amylase (82150-AMYL) TLB-Udip w/ Micro (81001-URINE) Radiology Referral (Radiology) Gastroenterology Referral (GI)  Problem # 2:  HYPERTENSION (ICD-401.9)  His updated medication list for this problem includes:    Hydrochlorothiazide 25 Mg Tabs (Hydrochlorothiazide) .Marland Kitchen... Take 1 tablet by mouth once a day    Benazepril Hcl 40 Mg Tabs (Benazepril hcl) .Marland Kitchen... 1po once daily    Amlodipine Besylate 10 Mg Tabs (Amlodipine besylate) .Marland Kitchen... 1po once daily  BP today: 122/80 Prior BP: 128/62 (04/30/2009)  Labs Reviewed: K+: 3.3 (05/20/2008) Creat: : 1.1 (05/20/2008)   Chol: 150 (07/06/2008)   HDL: 34.20 (07/06/2008)   LDL: 97 (07/06/2008)   TG: 94.0 (07/06/2008) stable overall by hx and exam, ok to continue meds/tx as is   Complete Medication List: 1)  Diclofenac Sodium 75 Mg Tbec (Diclofenac sodium) .... Take 1 tab by mouth  two times a day as needed pain 2)  Hydrochlorothiazide 25 Mg Tabs (Hydrochlorothiazide) .... Take 1 tablet by mouth once a day 3)  Pravachol 40 Mg Tabs (Pravastatin sodium) .... 2 by mouth once daily 4)  Benazepril Hcl 40 Mg Tabs (Benazepril hcl) .Marland Kitchen.. 1po once daily 5)  Amlodipine Besylate 10 Mg Tabs (Amlodipine besylate) .Marland Kitchen.. 1po once daily 6)  Tramadol Hcl 50 Mg Tabs (Tramadol hcl) .Marland Kitchen.. 1 - 2 by mouth q 6 hrs as needed pain 7)  Adult Aspirin Ec Low Strength 81 Mg Tbec (Aspirin) .Marland Kitchen.. 1 by mouth once daily  Patient Instructions: 1)  stop the pravachol as this can sometimes lead to constipation 2)  Continue all previous medications as before this visit 3)  Please go to the Lab in the  basement for your blood and/or urine tests today  4)  You will be contacted about the referral(s) to: CT scan today, and GI referral

## 2010-04-19 NOTE — Assessment & Plan Note (Signed)
Summary: Pancreatic Lesion    History of Present Illness Visit Type: consult  Primary GI MD: Stan Head MD [facility] Primary Provider: Corwin Levins MD Requesting Provider: Corwin Levins MD Chief Complaint: RLQ abd pain, constipation, and bloating  History of Present Illness:   65 yo [place] man. Prior pain RLQ pain last month - resolved. He becam constipated and could not defecate. Dr. Jonny Ruiz rxed a laxative, whch moved his bowels but still had gas pain. Then was found to have zoster in area of pain. CT abd and pelvis demonstrated pancreatic cyst lesion, 1 cm, confirmed by MR.   GI Review of Systems    Reports abdominal pain and  bloating.     Location of  Abdominal pain: RLQ.    Denies acid reflux, belching, chest pain, dysphagia with liquids, dysphagia with solids, heartburn, loss of appetite, nausea, vomiting, vomiting blood, weight loss, and  weight gain.      Reports constipation.     Denies anal fissure, black tarry stools, change in bowel habit, diarrhea, diverticulosis, fecal incontinence, heme positive stool, hemorrhoids, irritable bowel syndrome, jaundice, light color stool, liver problems, rectal bleeding, and  rectal pain.    Current Medications (verified): 1)  Diclofenac Sodium 75 Mg Tbec (Diclofenac Sodium) .... Take 1 Tab By Mouth Two Times A Day As Needed Pain 2)  Hydrochlorothiazide 25 Mg Tabs (Hydrochlorothiazide) .... Take 1 Tablet By Mouth Once A Day 3)  Pravachol 40 Mg  Tabs (Pravastatin Sodium) .... 2 By Mouth Once Daily 4)  Benazepril Hcl 40 Mg  Tabs (Benazepril Hcl) .Marland Kitchen.. 1po Once Daily 5)  Amlodipine Besylate 10 Mg  Tabs (Amlodipine Besylate) .Marland Kitchen.. 1po Once Daily 6)  Tramadol Hcl 50 Mg  Tabs (Tramadol Hcl) .Marland Kitchen.. 1 - 2 By Mouth Q 6 Hrs As Needed Pain 7)  Adult Aspirin Ec Low Strength 81 Mg Tbec (Aspirin) .Marland Kitchen.. 1 By Mouth Once Daily  Allergies: 1)  * Lyrica  CT Abdomen/Pelvis  Procedure date:  05/03/2009  Findings:      IMPRESSION:   1.  No acute  abnormality on CT of the abdomen and pelvis. 2.  Small low attenuation structure within the pancreas of questionable significance.  Consider dedicated CT of the pancreas or MRI of the abdomen to assess further. 3.  No calcified gallstones are noted. 4.  Degenerative joint disease particularly involving the facet joints of L4-5 and L5-S1. 5.  The appendix and the terminal ileum appear normal.  MRI of Abdomen  Procedure date:  05/08/2009  Findings:       IMPRESSION:   1.  Small unilocular cystic lesion within the body the pancreas correlates to the abnormality on comparison CT.  While this may represents a benign cyst (potentially of prior pancreatitis origin), cannot exclude a small intraductal papillary mucinous tumor or a unilocular serous cystadenoma.  Recommend follow-up MRI without and with contrast in 6 months. 2.  No additional abnormality within the abdomen and other than simple right renal cyst.   Past History:  Past Medical History: Reviewed history from 05/15/2007 and no changes required. Colon cancer, hx of Hypertension Hyperlipidemia left knee djd Diverticulosis, colon  Past Surgical History: Reviewed history from 05/20/2008 and no changes required. s/p transverse colectomy 2000 s/p left knee arthroscopy 2007 - [facility]  Family History: Reviewed history from 05/15/2007 and no changes required. 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         Zoster - resolved All other ROS negative except as per HPI.   Vital Signs:  Patient profile:   65 year old male Height:      66 inches Weight:      268 pounds BMI:     43.41 BSA:     2.27 Pulse rate:   72 /  minute Pulse rhythm:   regular BP sitting:   132 / 80  (left arm) Cuff size:   regular  Vitals Entered By: Ok [name] CMA (June 07, 2009 2:58 PM)  Physical Exam  General:  obese.  NAD Eyes:  anicteric Lungs:  Clear throughout to auscultation. Heart:  Regular rate and rhythm; no murmurs, rubs,  or bruits. 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 Neoplasm is possible. No prior history of pancreatitis. Will ask Dr. Merleen Milliner review and we will then contact patient. Risks, benefits,and indications of endoscopic ultrasound and FNA were reviewed with the patient and all questions answered.  Patient Instructions: 1)  Please continue current medications.  2)  Dr. Leone Payor will speak with Dr. Christella Hartigan and we will call you later this week about further scheduling.   3)  The medication list was reviewed and reconciled.  All changed / newly prescribed medications were explained.  A complete medication list was provided to the patient / caregiver.  Appended Document: Pancreatic Lesion patty, can you arrange for Upper EUS, , radial +/- linear for pancreatic cyst.  Next available EUS thursday.  Appended Document: Pancreatic Lesion eus scheduled for 06/17/09

## 2010-04-19 NOTE — Letter (Signed)
Summary: New Patient letter  [facility]  [address] [place], [place] [address]   Phone: [phone]  Fax: [phone]       05/04/2009 MRN: [id number]  Aaron Moss [address] [place], [place]  [address]  Dear Aaron Moss,  Welcome to the Gastroenterology Division at [facility].    You are scheduled to see Dr.  Leone Payor on 06-07-09 at 2:45pm on the 3rd floor at [facility], [address]  We ask that you try to arrive at our office 15 minutes prior to your appointment time to allow for check-in.  We would like you to complete the enclosed self-administered evaluation form prior to your visit and bring it with you on the day of your appointment.  We will review it with you.  Also, please bring a complete list of all your medications or, if you prefer, bring the medication bottles and we will list them.  Please bring your insurance card so that we may make a copy of it.  If your insurance requires a referral to see a specialist, please bring your referral form from your primary care physician.  Co-payments are due at the time of your visit and may be paid by cash, check or credit card.     Your office visit will consist of a consult with your physician (includes a physical exam), any laboratory testing he/she may order, scheduling of any necessary diagnostic testing (e.g. x-ray, ultrasound, CT-scan), and scheduling of a procedure (e.g. Endoscopy, Colonoscopy) if required.  Please allow enough time on your schedule to allow for any/all of these possibilities.    If you cannot keep your appointment, please call [phone] to cancel or reschedule prior to your appointment date.  This allows [place] the opportunity to schedule an appointment for another patient in need of care.  If you do not cancel or reschedule by 5 p.m. the business day prior to your appointment date, you will be charged a $50.00 late cancellation/no-show fee.    Thank you for choosing  [facility] for your medical needs.  We appreciate the opportunity to care for you.  Please visit [place] at our website  to learn more about our practice.                     Sincerely,                                                             The Gastroenterology Division

## 2010-04-19 NOTE — Progress Notes (Signed)
Summary: EUS   Phone Note Outgoing Call Call back at [facility] Phone [phone]   Call placed by: Chales Abrahams CMA Duncan Dull),  June 10, 2009 1:51 PM Summary of Call: pt scheduled for EUS need to review meds and instruct pt.   Initial call taken by: Chales Abrahams CMA Duncan Dull),  June 10, 2009 1:52 PM  Follow-up for Phone Call        left message on machine to call back work number is incorrect pt has been retired for 5 years Chales Abrahams CMA Duncan Dull)  June 11, 2009 1:33 PM   Additional Follow-up for Phone Call Additional follow up Details #1::        Pt. contacted and given instructions for egd on 06/17/09. Additional Follow-up by: Teryl Lucy RN,  June 14, 2009 10:25 AM  New Problems: NONSPECIFIC ABN FINDING RAD & OTH EXAM GI TRACT (ICD-793.4)   New Problems: NONSPECIFIC ABN FINDING RAD & OTH EXAM GI TRACT (ICD-793.4)

## 2010-04-19 NOTE — Letter (Signed)
Summary: EGD Instructions  [facility]  [address] [place], [place] [address]   Phone: [phone]  Fax: [phone]       [name]    11-14-1945    MRN: [id number]       Procedure Day /Date:06/17/09       Arrival Time: 1045 am       Procedure Time:1145am     Location of Procedure:                     [facility] ( Outpatient Registration)    PREPARATION FOR ENDOSCOPY   On 06/17/09 THE DAY OF THE PROCEDURE:  1.   No solid foods, milk or milk products are allowed after midnight the night before your procedure.  2.   Do not drink anything colored red or purple.  Avoid juices with pulp.  No orange juice.  3.  You may drink clear liquids until 745 am, which is 4 hours before your procedure.                                                                                                CLEAR LIQUIDS INCLUDE: Water Jello Ice Popsicles Tea (sugar ok, no milk/cream) Powdered fruit flavored drinks Coffee (sugar ok, no milk/cream) Gatorade Juice: apple, white grape, white cranberry  Lemonade Clear bullion, consomm, broth Carbonated beverages (any kind) Strained chicken noodle soup Hard Candy   MEDICATION INSTRUCTIONS  Unless otherwise instructed, you should take regular prescription medications with a small sip of water as early as possible the morning of your procedure.             OTHER INSTRUCTIONS  You will need a responsible adult at least 67 years of age to accompany you and drive you home.   This person must remain in the waiting room during your procedure.  Wear loose fitting clothing that is easily removed.  Leave jewelry and other valuables at home.  However, you may wish to bring a book to read or an iPod/MP3 player to listen to music as you wait for your procedure to start.  Remove all body piercing jewelry and leave at home.  Total time from sign-in until discharge is approximately 2-3 hours.  You should go home  directly after your procedure and rest.  You can resume normal activities the day after your procedure.  The day of your procedure you should not:   Drive   Make legal decisions   Operate machinery   Drink alcohol   Return to work  You will receive specific instructions about eating, activities and medications before you leave.    The above instructions have been reviewed and explained to me by   Aaron Moss CMA Duncan Dull)  June 10, 2009 1:55 PM     I fully understand and can verbalize these instructions over the phone mailed to home Date 06/10/09

## 2010-04-19 NOTE — Assessment & Plan Note (Signed)
Summary: rash on stomach/john/cd   Vital Signs:  Patient profile:   65 year old male Height:      66 inches Weight:      263 pounds BMI:     42.60 O2 Sat:      96 % on Room air Temp:     97.6 degrees F oral Pulse rate:   60 / minute Pulse rhythm:   regular Resp:     16 per minute BP sitting:   130 / 68  (left arm) Cuff size:   large  Vitals Entered By: Rock Nephew CMA (May 11, 2009 11:16 AM)  Nutrition Counseling: Patient's BMI is greater than 25 and therefore counseled on weight management options.  O2 Flow:  Room air CC: painful, itchy ras on stomac and back x 1wk Is Patient Diabetic? No   Primary Care Provider:  Corwin Levins MD  CC:  painful and itchy ras on stomac and back x 1wk.  History of Present Illness: New to me he complains of a painful rash from right mid-back over the right flank and abd.  Preventive Screening-Counseling & Management  Alcohol-Tobacco     Alcohol drinks/day: 0     Smoking Status: quit  Clinical Review Panels:  Diabetes Management   Creatinine:  1.3 (05/04/2009)   Last Flu Vaccine:  Fluvax 3+ (04/30/2009)  CBC   WBC:  7.3 (05/04/2009)   RBC:  5.77 (05/04/2009)   Hgb:  15.6 (05/04/2009)   Hct:  47.4 (05/04/2009)   Platelets:  211.0 (05/04/2009)   MCV  82.2 (05/04/2009)   MCHC  32.9 (05/04/2009)   RDW  14.8 (05/04/2009)   PMN:  59.3 (05/04/2009)   Lymphs:  32.5 (05/04/2009)   Monos:  6.3 (05/04/2009)   Eosinophils:  1.5 (05/04/2009)   Basophil:  0.4 (05/04/2009)  Complete Metabolic Panel   Glucose:  88 (05/04/2009)   Sodium:  145 (05/04/2009)   Potassium:  3.0 (05/04/2009)   Chloride:  103 (05/04/2009)   CO2:  33 (05/04/2009)   BUN:  10 (05/04/2009)   Creatinine:  1.3 (05/04/2009)   Albumin:  4.1 (05/04/2009)   Total Protein:  7.9 (05/04/2009)   Calcium:  9.3 (05/04/2009)   Total Bili:  1.3 (05/04/2009)   Alk Phos:  90 (05/04/2009)   SGPT (ALT):  19 (05/04/2009)   SGOT (AST):  29 (05/04/2009)   Medications  Prior to Update: 1)  Diclofenac Sodium 75 Mg Tbec (Diclofenac Sodium) .... Take 1 Tab By Mouth Two Times A Day As Needed Pain 2)  Hydrochlorothiazide 25 Mg Tabs (Hydrochlorothiazide) .... Take 1 Tablet By Mouth Once A Day 3)  Pravachol 40 Mg  Tabs (Pravastatin Sodium) .... 2 By Mouth Once Daily 4)  Benazepril Hcl 40 Mg  Tabs (Benazepril Hcl) .Marland Kitchen.. 1po Once Daily 5)  Amlodipine Besylate 10 Mg  Tabs (Amlodipine Besylate) .Marland Kitchen.. 1po Once Daily 6)  Tramadol Hcl 50 Mg  Tabs (Tramadol Hcl) .Marland Kitchen.. 1 - 2 By Mouth Q 6 Hrs As Needed Pain 7)  Adult Aspirin Ec Low Strength 81 Mg Tbec (Aspirin) .Marland Kitchen.. 1 By Mouth Once Daily 8)  Klor-Con 10 10 Meq Cr-Tabs (Potassium Chloride) .Marland Kitchen.. 1 By Mouth Once Daily  Current Medications (verified): 1)  Diclofenac Sodium 75 Mg Tbec (Diclofenac Sodium) .... Take 1 Tab By Mouth Two Times A Day As Needed Pain 2)  Hydrochlorothiazide 25 Mg Tabs (Hydrochlorothiazide) .... Take 1 Tablet By Mouth Once A Day 3)  Pravachol 40 Mg  Tabs (Pravastatin Sodium) .... 2  By Mouth Once Daily 4)  Benazepril Hcl 40 Mg  Tabs (Benazepril Hcl) .Marland Kitchen.. 1po Once Daily 5)  Amlodipine Besylate 10 Mg  Tabs (Amlodipine Besylate) .Marland Kitchen.. 1po Once Daily 6)  Tramadol Hcl 50 Mg  Tabs (Tramadol Hcl) .Marland Kitchen.. 1 - 2 By Mouth Q 6 Hrs As Needed Pain 7)  Adult Aspirin Ec Low Strength 81 Mg Tbec (Aspirin) .Marland Kitchen.. 1 By Mouth Once Daily 8)  Klor-Con 10 10 Meq Cr-Tabs (Potassium Chloride) .Marland Kitchen.. 1 By Mouth Once Daily 9)  Lyrica 75 Mg Caps (Pregabalin) .... One By Mouth Three Times A Day As Needed For Pain From Shingles 10)  Valacyclovir Hcl 1 Gm Tabs (Valacyclovir Hcl) .... One By Mouth Three Times A Day For 10 Days  Allergies (verified): No Known Drug Allergies  Past History:  Past Medical History: Reviewed history from 05/15/2007 and no changes required. Colon cancer, hx of Hypertension Hyperlipidemia left knee djd Diverticulosis, colon  Past Surgical History: Reviewed history from 05/20/2008 and no changes required. s/p  transverse colectomy 2000 s/p left knee arthroscopy 2007 - [facility]  Family History: Reviewed history from 05/15/2007 and no changes required. brother with lung cancer brother with DM mother had liver cancer grandmother with breast cancer  Social History: Reviewed history from 05/15/2007 and no changes required. 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   General:  Denies chills, fatigue, fever, loss of appetite, malaise, sleep disorder, sweats, weakness, and weight loss.  Physical Exam  General:  alert, well-developed, well-nourished, well-hydrated, appropriate dress, cooperative to examination, good hygiene, and overweight-appearing.   Head:  normocephalic, atraumatic, no abnormalities observed, and no abnormalities palpated.   Mouth:  Oral mucosa and oropharynx without lesions or exudates.  Teeth in good repair. Neck:  supple, full ROM, no masses, no thyromegaly, no thyroid nodules or tenderness, no carotid bruits, and no cervical lymphadenopathy.   Lungs:  normal respiratory effort, no intercostal retractions, no accessory muscle use, normal breath sounds, no crackles, and no wheezes.   Heart:  normal rate, regular rhythm, no murmur, no gallop, no rub, and no JVD.   Abdomen:  soft, non-tender, normal bowel sounds, no distention, no masses, no guarding, no rigidity, no hepatomegaly, and no splenomegaly.   Msk:  No deformity or scoliosis noted of thoracic or lumbar spine.   Pulses:  R and L carotid,radial,femoral,dorsalis pedis and posterior tibial pulses are full and equal bilaterally Extremities:  No clubbing, cyanosis, edema, or deformity noted with normal full range of motion of all joints.   Neurologic:  No cranial nerve deficits noted. Station  and gait are normal. Plantar reflexes are down-going bilaterally. DTRs are symmetrical throughout. Sensory, motor and coordinative functions appear intact. Skin:  there are several groups of excoriated vesicles along the right T8 dermatome from posterior to anterior, there is some scabbing but no exudate, pustules, induration, fluctuance or other evidence of secondary infection Cervical Nodes:  no anterior cervical adenopathy and no posterior cervical adenopathy.   Axillary Nodes:  no R axillary adenopathy and no L axillary adenopathy.   Inguinal Nodes:  no R inguinal adenopathy and no L inguinal adenopathy.     Impression & Recommendations:  Problem # 1:  HERPES ZOSTER (ICD-053.9) Assessment New start valtrex for infection and lyrica for pain Orders: Prescription Created Electronically [phone])  Complete Medication  List: 1)  Diclofenac Sodium 75 Mg Tbec (Diclofenac sodium) .... Take 1 tab by mouth two times a day as needed pain 2)  Hydrochlorothiazide 25 Mg Tabs (Hydrochlorothiazide) .... Take 1 tablet by mouth once a day 3)  Pravachol 40 Mg Tabs (Pravastatin sodium) .... 2 by mouth once daily 4)  Benazepril Hcl 40 Mg Tabs (Benazepril hcl) .Marland Kitchen.. 1po once daily 5)  Amlodipine Besylate 10 Mg Tabs (Amlodipine besylate) .Marland Kitchen.. 1po once daily 6)  Tramadol Hcl 50 Mg Tabs (Tramadol hcl) .Marland Kitchen.. 1 - 2 by mouth q 6 hrs as needed pain 7)  Adult Aspirin Ec Low Strength 81 Mg Tbec (Aspirin) .Marland Kitchen.. 1 by mouth once daily 8)  Klor-con 10 10 Meq Cr-tabs (Potassium chloride) .Marland Kitchen.. 1 by mouth once daily 9)  Lyrica 75 Mg Caps (Pregabalin) .... One by mouth three times a day as needed for pain from shingles 10)  Valacyclovir Hcl 1 Gm Tabs (Valacyclovir hcl) .... One by mouth three times a day for 10 days  Patient Instructions: 1)  Please schedule a follow-up appointment in 2 weeks. Prescriptions: VALACYCLOVIR HCL 1 GM TABS (VALACYCLOVIR HCL) One by mouth three times a day for 10 days  #30 x 2   Entered and  Authorized by:   Etta Grandchild MD   Signed by:   Etta Grandchild MD on 05/11/2009   Method used:   Handwritten   RxID:   [id number] LYRICA 75 MG CAPS (PREGABALIN) One by mouth three times a day as needed for pain from shingles  #63 x 0   Entered and Authorized by:   Etta Grandchild MD   Signed by:   Etta Grandchild MD on 05/11/2009   Method used:   Samples Given   RxID:   [id number]

## 2010-04-19 NOTE — Progress Notes (Signed)
  Phone Note From Pharmacy   Summary of Call: pts stating the pharmacy told him that the RX has to be in 200mg  tablest for it to fall under the $4 cost? Patient would like a new script sent over? Please advise? Initial call taken by: Josph Macho RMA,  May 13, 2009 2:14 PM  Follow-up for Phone Call        needs to be more specific - all of his prescriptions are already tablets Follow-up by: Corwin Levins MD,  May 13, 2009 2:58 PM  Additional Follow-up for Phone Call Additional follow up Details #1::        Discussed with Dr. Jonny Ruiz for me to send in a prescription for ACYCLOVIR 200 mg 5 times a day for seven days. Additional Follow-up by: Josph Macho RMA,  May 13, 2009 3:09 PM    Additional Follow-up for Phone Call Additional follow up Details #2::    Informed pt. Follow-up by: Josph Macho RMA,  May 13, 2009 3:11 PM  New/Updated Medications: * ACYCLOVIR 200 MG TABS (ACYCLOVIR) One by mouth five times a day for 7 days Prescriptions: ACYCLOVIR 200 MG TABS (ACYCLOVIR) One by mouth five times a day for 7 days  #35 x 1   Entered by:   Josph Macho RMA   Authorized by:   Corwin Levins MD   Signed by:   Josph Macho RMA on 05/13/2009   Method used:   Faxed to ...       [facility] [address] [phone]* (retail)       [address]       [place], [place]  [address]       Ph: [phone]       Fax: [phone]   RxID:   [id number] ACYCLOVIR 200 MG TABS (ACYCLOVIR) One by mouth five times a day for 7 days  #35 x 1   Entered by:   Josph Macho RMA   Authorized by:   Corwin Levins MD   Signed by:   Josph Macho RMA on 05/13/2009   Method used:   Faxed to ...       [facility]  [address] [id number] (retail)       [address]       [place], [place]  [address]       Ph: [phone]       Fax: [phone]   RxID:   [id number]

## 2010-04-19 NOTE — Procedures (Signed)
Summary: Endoscopic Ultrasound  Patient: [name] Note: All result statuses are Final unless otherwise noted.  Tests: (1) Endoscopic Ultrasound (EUS)  EUS Endoscopic Ultrasound                             DONE     [facility]     [address] 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           MEDICATIONS:   Fentanyl 125 mcg IV, Versed 8 mg IV, cipro 400mg  IV           DESCRIPTION OF PROCEDURE:   After the risks, benefits, and     alternatives of the procedure were thoroughly explained, informed     consent was obtained.  The  endoscope was introduced through the     mouth  and advanced to the duodenum.     <<PROCEDUREIMAGES>>           Endoscopic findings (limited views with radial, linear echo     endoscope):     1. 2-3cm hiatal hernia     2. Otherwise normal esophagus, stomach, duodenum           EUS findings:     1. 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  The cyst has no     septea, no associated nodules or solid masses. The cyst was     completely aspirated with a single pass with a 22 guage [place]     [employer] FNA needle using color doppler to avoid significant     blood vessels.  This yielded about 1-2cc of thin, clear fluid     which was sent to [facility] for amylase, lipase and also to     cytology.     2. Otherwise pancreatic parenchyma was normal; no solid masses, no     signs of chronic pancreatitis.     3. Normal main pancreatic duct;  non-dilated.     4. CBD was normal, non-dilated and without filling defects.     5. No peripancreatic adenopathy.     6. Limited views of liver, spleen, portal and splenic vessels were     all normal.           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  Main duct IMPN, pseudocyst (previously was a     heavy drinker), serous cystadenoma are all possible etiologies.     Await final fluid analysis results.  Aaron Moss will complete 3 days of  cipro twice daily to help decrease risk of infection.           ______________________________     Aaron Fee, Aaron Moss           cc: Oliver Barre, Aaron Moss           n.     Rosalie Doctor:   Aaron Moss at 06/17/2009 01:02 PM           Shara Blazing, [id number]  Note: An exclamation mark (!) indicates a result that was not dispersed into the flowsheet. Document Creation Date: 06/17/2009 1:03 PM _______________________________________________________________________  (1) Order result status: Final Collection or observation date-time: 06/17/2009 12:51 Requested date-time:  Receipt date-time:  Reported date-time:  Referring Physician:   Ordering Physician: Rob Bunting [phone]) Specimen Source:  Source: Launa Grill Order Number: [id number] Lab site:   Appended Document: Endoscopic Ultrasound cytology showed no malignant cells, CEA was 105.4ng?mL (low), amylase was 4.4 (low).    Nothing concerning about the cyst fluid tests.  Aaron Moss should have MRI in 12 months and if no change in size at that point, no need for furhter dedicated follow up.  Patty, can you please call him, arrange follow up MRI in one year.  Appended Document: Endoscopic Ultrasound pt aware

## 2010-04-19 NOTE — Progress Notes (Signed)
Summary: Pain  Phone Note Call from Patient Call back at Home Phone [phone]   Caller: Patient Summary of Call: pt called stating that his pain has increased and he was advised by MD to call if this happened Initial call taken by: Margaret Pyle, CMA,  May 05, 2009 1:58 PM  Follow-up for Phone Call        CT is negative, ok to f/u with GI as planned;  I have nothing further to offer Follow-up by: Corwin Levins MD,  May 05, 2009 2:06 PM  Additional Follow-up for Phone Call Additional follow up Details #1::        pt informed of above Additional Follow-up by: Margaret Pyle, CMA,  May 05, 2009 2:14 PM

## 2010-04-19 NOTE — Miscellaneous (Signed)
Summary: [facility]   Imported By: Lester Marcellus 06/18/2009 08:28:15  _____________________________________________________________________  External Attachment:    Type:   Image     Comment:   External Document

## 2010-04-19 NOTE — Miscellaneous (Signed)
Summary: rx  Clinical Lists Changes  Medications: Added new medication of CIPROFLOXACIN HCL 500 MG  TABS (CIPROFLOXACIN HCL) Take 1 twice a day for 3 days - Signed Rx of CIPROFLOXACIN HCL 500 MG  TABS (CIPROFLOXACIN HCL) Take 1 twice a day for 3 days;  #6 x 0;  Signed;  Entered by: Rachael Fee MD;  Authorized by: Rachael Fee MD;  Method used: Print then Give to Patient    Prescriptions: CIPROFLOXACIN HCL 500 MG  TABS (CIPROFLOXACIN HCL) Take 1 twice a day for 3 days  #6 x 0   Entered and Authorized by:   Rachael Fee MD   Signed by:   Rachael Fee MD on 06/17/2009   Method used:   Print then Give to Patient   RxID:   [id number]

## 2010-04-19 NOTE — Progress Notes (Signed)
Summary: Aortic ultrasound  Phone Note Outgoing Call   Call placed by: Dagoberto Reef,  June 08, 2009 3:19 PM Summary of Call: Dr Jonny Ruiz, called pt to give him his appt date and time of 3/24/111 @ 11am ([facility]) . Patient said that he thought you was going to cancel aortic u/s because he told you he had a CT done. Please advise.    Thanks Initial call taken by: Dagoberto Reef,  June 08, 2009 3:23 PM  Follow-up for Phone Call        yes, sorry, I meant to cancel that   - done now Follow-up by: Corwin Levins MD,  June 08, 2009 4:50 PM  Additional Follow-up for Phone Call Additional follow up Details #1::        Patient notified referral was cancelled. Additional Follow-up by: Dagoberto Reef,  June 09, 2009 9:37 AM

## 2010-04-21 NOTE — Progress Notes (Signed)
  Phone Note Refill Request Message from:  Fax from Pharmacy on March 25, 2010 10:03 AM  Refills Requested: Medication #1:  HYDROCHLOROTHIAZIDE 25 MG TABS Take 1 tablet by mouth once a day   Dosage confirmed as above?Dosage Confirmed   Notes: [employer] Initial call taken by: Zella Ball Ewing CMA (AAMA),  March 25, 2010 10:04 AM    Prescriptions: HYDROCHLOROTHIAZIDE 25 MG TABS (HYDROCHLOROTHIAZIDE) Take 1 tablet by mouth once a day  #100 Each x 0   Entered by:   Scharlene Gloss CMA (AAMA)   Authorized by:   Corwin Levins MD   Signed by:   Scharlene Gloss CMA (AAMA) on 03/25/2010   Method used:   Faxed to ...       [facility] [address] [phone]* (retail)       [address]       [place], [place]  [address]       Ph: [phone]       Fax: [phone]   RxID:   [id number]

## 2010-04-25 ENCOUNTER — Other Ambulatory Visit: Payer: Self-pay | Admitting: Internal Medicine

## 2010-04-25 ENCOUNTER — Other Ambulatory Visit: Payer: Medicare Other

## 2010-04-25 ENCOUNTER — Encounter: Payer: Self-pay | Admitting: Internal Medicine

## 2010-04-25 ENCOUNTER — Ambulatory Visit: Payer: Self-pay | Admitting: Internal Medicine

## 2010-04-25 ENCOUNTER — Encounter (INDEPENDENT_AMBULATORY_CARE_PROVIDER_SITE_OTHER): Payer: Self-pay | Admitting: *Deleted

## 2010-04-25 ENCOUNTER — Ambulatory Visit (INDEPENDENT_AMBULATORY_CARE_PROVIDER_SITE_OTHER): Payer: Medicare Other | Admitting: Internal Medicine

## 2010-04-25 DIAGNOSIS — Z23 Encounter for immunization: Secondary | ICD-10-CM

## 2010-04-25 DIAGNOSIS — M25569 Pain in unspecified knee: Secondary | ICD-10-CM | POA: Insufficient documentation

## 2010-04-25 DIAGNOSIS — I10 Essential (primary) hypertension: Secondary | ICD-10-CM

## 2010-04-25 DIAGNOSIS — Z0000 Encounter for general adult medical examination without abnormal findings: Secondary | ICD-10-CM

## 2010-04-25 DIAGNOSIS — Z125 Encounter for screening for malignant neoplasm of prostate: Secondary | ICD-10-CM

## 2010-04-25 LAB — LIPID PANEL
Cholesterol: 142 mg/dL (ref 0–200)
LDL Cholesterol: 91 mg/dL (ref 0–99)
Total CHOL/HDL Ratio: 4
Triglycerides: 81 mg/dL (ref 0.0–149.0)

## 2010-04-25 LAB — URINALYSIS
Bilirubin Urine: NEGATIVE
Leukocytes, UA: NEGATIVE
Nitrite: NEGATIVE
Total Protein, Urine: NEGATIVE
pH: 5.5 (ref 5.0–8.0)

## 2010-04-25 LAB — CBC WITH DIFFERENTIAL/PLATELET
Basophils Relative: 0.3 % (ref 0.0–3.0)
Eosinophils Relative: 1.9 % (ref 0.0–5.0)
HCT: 44.6 % (ref 39.0–52.0)
Hemoglobin: 15.1 g/dL (ref 13.0–17.0)
Lymphs Abs: 1.7 10*3/uL (ref 0.7–4.0)
MCV: 81.8 fl (ref 78.0–100.0)
Monocytes Absolute: 0.4 10*3/uL (ref 0.1–1.0)
Monocytes Relative: 5.6 % (ref 3.0–12.0)
RBC: 5.46 Mil/uL (ref 4.22–5.81)
WBC: 7.8 10*3/uL (ref 4.5–10.5)

## 2010-04-25 LAB — BASIC METABOLIC PANEL
Chloride: 104 mEq/L (ref 96–112)
GFR: 72.89 mL/min (ref >60.00)
Potassium: 3.5 mEq/L (ref 3.5–5.1)
Sodium: 143 mEq/L (ref 135–145)

## 2010-04-25 LAB — TSH: TSH: 1.32 u[IU]/mL (ref 0.35–5.50)

## 2010-04-25 LAB — HEPATIC FUNCTION PANEL
ALT: 15 U/L (ref 0–53)
AST: 17 U/L (ref 0–37)
Total Protein: 7.2 g/dL (ref 6.0–8.3)

## 2010-05-05 NOTE — Assessment & Plan Note (Signed)
Summary: Aaron Moss   Vital Signs:  Patient profile:   65 year old male Height:      66 inches Weight:      263.50 pounds BMI:     42.68 O2 Sat:      96 % on Room air Temp:     97.9 degrees F oral Pulse rate:   75 / minute BP sitting:   138 / 74  (left arm) Cuff size:   large  Vitals Entered By: Margaret Pyle, CMA (April 25, 2010 9:19 AM)  O2 Flow:  Room air  CC: F/U LT knee    Primary Care Provider:  Corwin Levins MD  CC:  F/U LT knee .  History of Present Illness: here for f/u - overall doing ok;  Pt denies CP, worsening sob, doe, wheezing, orthopnea, pnd, worsening LE edema, palps, dizziness or syncope  Pt denies new neuro symptoms such as headache, facial or extremity weakness  Pt denies polydipsia, polyuria   Overall good compliance with meds, trying to follow low chol diet, wt stable, little excercise however.  No fever, wt loss, night sweats, loss of appetite or other constitutional symptoms  Overall good compliance with meds, and good tolerability.  Denies worsening depressive symptoms, suicidal ideation, or panic.   Pt states good ability with ADL's, low fall risk, home safety reviewed and adequate, no significant change in hearing or vision, trying to follow lower chol diet, and occasionally active only with regular excercise.   Also here to f/u left knee,  s/p left arthrosopci in the past, has had maybe 2 or 3 cortisone in the past htat helped, but none for about 2 yrs;  has been walking more about 5 days per wk but knee has been achin, sweling lately.  Peak wt has been about 291 in the past, now 263;  left knee aches daily, mild to mod at this point wth pain, has a click or even a catch with movin a certain way.   No giveways and no falls. Has last sen ortho > 3 yrs at [facility].   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) 9)  Abdominal Pain, Generalized  (ICD-789.07) 10)  Abdominal Pain Right Upper Quadrant  (ICD-789.01) 11)  Hepatotoxicity, Drug-induced, Risk of  (ICD-V58.69) 12)  Hip Pain, Right  (ICD-719.45) 13)  Back Pain  (ICD-724.5) 14)  Shoulder Pain, Right  (ICD-719.41) 15)  Uri  (ICD-465.9) 16)  Preventive Health Care  (ICD-V70.0) 17)  Sinusitis- Acute-nos  (ICD-461.9) 18)  Preventive Health Care  (ICD-V70.0) 19)  Diverticulosis, Colon  (ICD-562.10) 20)  Hyperlipidemia  (ICD-272.4) 21)  Hypertension  (ICD-401.9) 22)  Colon Cancer, Hx of  (ICD-V10.05)  Medications Prior to Update: 1)  Diclofenac Sodium 75 Mg Tbec (Diclofenac Sodium) .... Take 1 Tab By Mouth Two Times A Day As Needed Pain 2)  Hydrochlorothiazide 25 Mg Tabs (Hydrochlorothiazide) .... Take 1 Tablet By Mouth Once A Day 3)  Pravachol 40 Mg  Tabs (Pravastatin Sodium) .... 2 By Mouth Once Daily 4)  Benazepril Hcl 40 Mg  Tabs (Benazepril Hcl) .Marland Kitchen.. 1po Once Daily 5)  Amlodipine Besylate 10 Mg  Tabs (Amlodipine Besylate) .Marland Kitchen.. 1po Once Daily 6)  Tramadol Hcl 50 Mg  Tabs (Tramadol Hcl) .Marland Kitchen.. 1 - 2 By Mouth Q 6 Hrs As Needed Pain 7)  Adult Aspirin Ec Low Strength 81 Mg Tbec (Aspirin) .Marland Kitchen.. 1 By Mouth Once Daily 8)  Gabapentin 300 Mg Caps (Gabapentin) .Marland Kitchen.. 1 By Mouth Three Times A Day  Current Medications (verified): 1)  Diclofenac Sodium 75 Mg Tbec (Diclofenac Sodium) .... Take 1 Tab By Mouth Two Times A Day As Needed Pain 2)  Hydrochlorothiazide 25 Mg Tabs (Hydrochlorothiazide) .... Take 1 Tablet By Mouth Once A Day 3)  Pravachol 40 Mg  Tabs (Pravastatin Sodium) .... 2 By Mouth Once Daily 4)  Benazepril Hcl 40 Mg  Tabs (Benazepril Hcl) .Marland Kitchen.. 1po Once Daily 5)  Amlodipine Besylate 10 Mg  Tabs (Amlodipine Besylate) .Marland Kitchen.. 1po Once Daily 6)   Tramadol Hcl 50 Mg  Tabs (Tramadol Hcl) .Marland Kitchen.. 1 - 2 By Mouth Q 6 Hrs As Needed Pain 7)  Adult Aspirin Ec Low Strength 81 Mg Tbec (Aspirin) .Marland Kitchen.. 1 By Mouth Once Daily 8)  Gabapentin 300 Mg Caps (Gabapentin) .Marland Kitchen.. 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 Married Former Smoker - quit approx 1990, prior 1ppd x 45yrs Alcohol use-yes 3 children Drug use-no  Risk Factors: Alcohol Use: 0 (05/11/2009)  Risk Factors: Smoking Status: quit (05/11/2009)  Review of Systems  The patient denies anorexia, fever, vision loss, decreased hearing, hoarseness, chest pain, syncope, dyspnea on exertion, peripheral edema, prolonged cough, headaches, hemoptysis, abdominal pain, melena, hematochezia, severe indigestion/heartburn, hematuria, muscle weakness, suspicious skin lesions, transient blindness, depression, unusual weight change, abnormal bleeding, enlarged lymph nodes, and angioedema.         all otherwise negative per pt -    Physical Exam  General:  alert and overweight-appearing.   Head:  normocephalic and atraumatic.   Eyes:  vision grossly intact, pupils equal, and pupils round.   Ears:  R ear normal and L ear normal.   Nose:  no external deformity and no nasal discharge.   Mouth:  no gingival abnormalities and pharynx pink and moist.   Neck:  supple and no masses.   Lungs:  normal respiratory effort and normal breath sounds.   Heart:  normal rate and regular rhythm.     Abdomen:  soft, non-tender, and normal bowel sounds.   Msk:  no joint tenderness and no joint swelling. except for left knee trace effusion with crepitus and discomfort but FROM Extremities:  no edema, no erythema  Neurologic:  cranial nerves II-XII intact and strength normal in all extremities.     Impression & Recommendations:  Problem # 1:  Preventive Health Care (ICD-V70.0) Overall doing well, age appropriate education and counseling updated, referral for preventive services and immunizations addressed, dietary counseling and smoking status adressed , most recent labs reviewed I have personally reviewed and have noted 1.The patient's medical and social history 2.Their use of alcohol, tobacco or illicit drugs 3.Their current medications and supplements 4. Functional ability including ADL's, fall  risk, home safety risk, hearing & visual impairment  5.Diet and physical activities 6.Evidence for depression or mood disorders The patients weight, height, BMI  have been recorded in the chart I have made referrals, counseling and provided education to the patient based review of the above  Orders: TLB-BMP (Basic Metabolic Panel-BMET) (80048-METABOL) TLB-CBC Platelet - w/Differential (85025-CBCD) TLB-Hepatic/Liver Function Pnl (80076-HEPATIC) TLB-Lipid Panel (80061-LIPID) TLB-PSA (Prostate Specific Antigen) (84153-PSA) TLB-TSH (Thyroid Stimulating Hormone) (84443-TSH) TLB-Udip ONLY (81003-UDIP)  Problem # 2:  KNEE PAIN, LEFT (ICD-719.46)  His updated medication list for this problem includes:    Diclofenac Sodium 75 Mg Tbec (Diclofenac sodium) .Marland Kitchen... Take 1 tab by mouth two times a day as needed pain    Tramadol Hcl 50 Mg Tabs (Tramadol hcl) .Marland Kitchen... 1 - 2 by mouth q 6 hrs as needed pain    Adult Aspirin Ec Low Strength 81 Mg Tbec (Aspirin) .Marland Kitchen... 1 by mouth once daily ok to refer back to [place] [facility]  Orders: Orthopedic Surgeon Referral (Ortho Surgeon)  Problem # 3:  HYPERTENSION  (ICD-401.9)  His updated medication list for this problem includes:    Hydrochlorothiazide 25 Mg Tabs (Hydrochlorothiazide) .Marland Kitchen... Take 1 tablet by mouth once a day    Benazepril Hcl 40 Mg Tabs (Benazepril hcl) .Marland Kitchen... 1po once daily    Amlodipine Besylate 10 Mg Tabs (Amlodipine besylate) .Marland Kitchen... 1po once daily  BP today: 138/74 Prior BP: 140/60 (06/08/2009)  Labs Reviewed: K+: 3.0 (05/04/2009) Creat: : 1.3 (05/04/2009)   Chol: 132 (06/08/2009)   HDL: 34.90 (06/08/2009)   LDL: 71 (06/08/2009)   TG: 132.0 (06/08/2009) stable overall by hx and exam, ok to continue meds/tx as is   Complete Medication List: 1)  Diclofenac Sodium 75 Mg Tbec (Diclofenac sodium) .... Take 1 tab by mouth two times a day as needed pain 2)  Hydrochlorothiazide 25 Mg Tabs (Hydrochlorothiazide) .... Take 1 tablet by mouth once a day 3)  Pravachol 40 Mg Tabs (Pravastatin sodium) .... 2 by mouth once daily 4)  Benazepril Hcl 40 Mg Tabs (Benazepril hcl) .Marland Kitchen.. 1po once daily 5)  Amlodipine Besylate 10 Mg Tabs (Amlodipine besylate) .Marland Kitchen.. 1po once daily 6)  Tramadol Hcl 50 Mg Tabs (Tramadol hcl) .Marland Kitchen.. 1 - 2 by mouth q 6 hrs as needed pain 7)  Adult Aspirin Ec Low Strength 81 Mg Tbec (Aspirin) .Marland Kitchen.. 1 by mouth once daily 8)  Gabapentin 300 Mg Caps (Gabapentin) .Marland Kitchen.. 1 by mouth three times a day  Other Orders: Admin 1st Vaccine (16109) Flu Vaccine 1yrs + [phone])  Patient Instructions: 1)  You will be contacted about the referral(s) to: orthopedic 2)  You are due for followup colonscopy in April 2014 3)  You had the flu shot today 4)  Please go to the Lab in the basement for your blood and/or urine tests today 5)  Please call the number on the [facility] Card for results of your testing  6)  Please schedule a follow-up appointment in 1 year, or sooner if needed   Orders Added: 1)  Orthopedic Surgeon Referral [Ortho Surgeon] 2)  TLB-BMP (Basic Metabolic Panel-BMET) [80048-METABOL] 3)  TLB-CBC Platelet - w/Differential  [85025-CBCD] 4)  TLB-Hepatic/Liver Function Pnl [80076-HEPATIC] 5)  TLB-Lipid Panel [80061-LIPID] 6)  TLB-PSA (Prostate Specific Antigen) [84153-PSA] 7)  TLB-TSH (Thyroid Stimulating Hormone) [84443-TSH] 8)  TLB-Udip ONLY [81003-UDIP] 9)  Admin 1st Vaccine [90471] 10)  Flu Vaccine 40yrs + [90658] 11)  Est. Patient 65& > [99397]  B-PSA (Prostate Specific Antigen) [09811-BJY] 7)  TLB-TSH (Thyroid  Stimulating Hormone) [84443-TSH] 8)  TLB-Udip ONLY [81003-UDIP] 9)  Admin 1st Vaccine [90471] 10)  Flu Vaccine 90yrs + [04540]     Flu Vaccine Consent Questions     Do you have a history of severe allergic reactions to this vaccine? no    Any prior history of allergic reactions to egg and/or gelatin? no    Do you have a sensitivity to the preservative Thimersol? no    Do you have a past history of Guillan-Barre Syndrome? no    Do you currently have an acute febrile illness? no    Have you ever had a severe reaction to latex? no    Vaccine information given and explained to patient? yes    Are you currently pregnant? no    Lot Number:[id number]   Exp Date:09/17/2010   Site Given  Right Deltoid IM

## 2010-06-10 ENCOUNTER — Other Ambulatory Visit: Payer: Self-pay | Admitting: Internal Medicine

## 2010-06-10 NOTE — Telephone Encounter (Signed)
Done hardcopy to dahlia/LIM B  

## 2010-06-10 NOTE — Telephone Encounter (Signed)
Rx faxed to pharamacy 

## 2010-06-13 LAB — PANC CYST FLD ANLYS-PATHFNDR-TG

## 2010-06-28 ENCOUNTER — Telehealth: Payer: Self-pay

## 2010-06-28 DIAGNOSIS — K862 Cyst of pancreas: Secondary | ICD-10-CM

## 2010-06-28 NOTE — Telephone Encounter (Signed)
Message copied by Chales Abrahams on Tue Jun 28, 2010  1:36 PM ------      Message from: Chales Abrahams      Created: Tue May 24, 2010  1:35 PM       Needs repeat MRI

## 2010-06-28 NOTE — Telephone Encounter (Signed)
Called and advised pt it is time to have repeat MRI and he agreed to have it scheduled.  I will put in the order and schedule and call pt with the instructions date and time.  Pt scheduled for [facility] radiology on 07/04/10 8 am pt to arrive at 745 am nothing to eat or drink after midnight.

## 2010-07-04 ENCOUNTER — Ambulatory Visit (HOSPITAL_COMMUNITY)
Admission: RE | Admit: 2010-07-04 | Discharge: 2010-07-04 | Disposition: A | Discharge status: Home / Self Care | Payer: Medicare Other | Source: Ambulatory Visit | Attending: Gastroenterology | Admitting: Gastroenterology

## 2010-07-04 DIAGNOSIS — N281 Cyst of kidney, acquired: Secondary | ICD-10-CM | POA: Insufficient documentation

## 2010-07-04 DIAGNOSIS — K863 Pseudocyst of pancreas: Secondary | ICD-10-CM | POA: Insufficient documentation

## 2010-07-04 DIAGNOSIS — K862 Cyst of pancreas: Secondary | ICD-10-CM | POA: Insufficient documentation

## 2010-07-04 LAB — CREATININE, SERUM: GFR calc non Af Amer: 59 mL/min — ABNORMAL LOW (ref >60)

## 2010-07-04 MED ORDER — GADOBENATE DIMEGLUMINE 529 MG/ML IV SOLN
20.0000 mL | Freq: Once | INTRAVENOUS | Status: AC | PRN
  Administered 2010-07-04: 20 mL via INTRAVENOUS

## 2010-07-05 ENCOUNTER — Telehealth: Payer: Self-pay

## 2010-07-05 NOTE — Telephone Encounter (Signed)
Message copied by Chales Abrahams on Tue Jul 05, 2010  8:27 AM ------      Message from: Rob Bunting      Created: Tue Jul 05, 2010  7:43 AM                   Please call the patient.  MRI showed that cyst has not grown and may be a bit smaller compared to imaging a year ago.            FNA  During EUS last year:  cytology showed no malignant cells, CEA was 105.4ng?mL (low), amylase was 4.4 (low).              There is no need for further dedicated followup of this cyst given stability and reassuring fluid testing last year.

## 2010-07-05 NOTE — Progress Notes (Signed)
Pt aware.

## 2010-07-05 NOTE — Telephone Encounter (Signed)
Pt aware.

## 2010-07-11 ENCOUNTER — Other Ambulatory Visit: Payer: Self-pay | Admitting: Internal Medicine

## 2010-08-05 NOTE — Op Note (Signed)
[place]. [facility]  Patient:    Aaron Moss, Aaron Moss                      MRN: [id number] Proc. Date: 08/19/99 Adm. Date:  [id number] Disc. Date: [id number] Attending:  Charlton Haws                           Operative Report  ACCOUNT NUMBER:  [id number]  OFFICE MEDICAL RECORD NUMBER:  [facility] [phone]  PREOPERATIVE DIAGNOSIS:  Unneeded Port-A-Cath status post treatment for colon cancer.  POSTOPERATIVE DIAGNOSIS:  Unneeded Port-A-Cath status post treatment for colon cancer.  OPERATION:  Removal of Port-A-Cath.  SURGEON:  Currie Paris, M.D.  ANESTHESIA:  MAC.  CLINICAL HISTORY:  This patient has completed his chemotherapy and no longer needed his Port-A-Cath.  DESCRIPTION OF PROCEDURE:  The patient was brought to the operating room and given some IV sedation.  The area over the Port-A-Cath was prepped as a sterile field with Betadine and draped.  Xylocaine 1% mixed equally with 0.5% Marcaine with epinephrine was used for local and infiltrated around the Port-A-Cath.  About two thirds of the old scar was incised and the Port-A-Cath found in its pocket.  The holding sutures were cut and the catheter was backED up a little bit.  A 3-0 Vicryl was placed as a pursestring around the tract and then the Port-A-Cath tubing backed out completely.  The pursestring was tied down to prevent any back leak of blood.  The area appeared to be dry and was closed with 3-0 Vicryl followed by 4-0 Monocryl subcuticular plus Steri-Strips.  The patient tolerated the procedure well.  There were no operative complications.  All counts were correct. DD:  08/19/99 TD:  08/23/99 Job: [id number]

## 2010-08-09 ENCOUNTER — Other Ambulatory Visit: Payer: Self-pay | Admitting: Internal Medicine

## 2010-08-19 ENCOUNTER — Other Ambulatory Visit: Payer: Self-pay | Admitting: Internal Medicine

## 2010-09-10 ENCOUNTER — Other Ambulatory Visit: Payer: Self-pay | Admitting: Internal Medicine

## 2010-12-14 ENCOUNTER — Encounter: Payer: Self-pay | Admitting: Internal Medicine

## 2010-12-14 ENCOUNTER — Ambulatory Visit (INDEPENDENT_AMBULATORY_CARE_PROVIDER_SITE_OTHER): Payer: Medicare Other | Admitting: Internal Medicine

## 2010-12-14 VITALS — BP 122/64 | HR 78 | Temp 98.6°F | Ht 66.0 in | Wt 257.0 lb

## 2010-12-14 DIAGNOSIS — I10 Essential (primary) hypertension: Secondary | ICD-10-CM

## 2010-12-14 DIAGNOSIS — Z0001 Encounter for general adult medical examination with abnormal findings: Secondary | ICD-10-CM | POA: Insufficient documentation

## 2010-12-14 DIAGNOSIS — Z0000 Encounter for general adult medical examination without abnormal findings: Secondary | ICD-10-CM

## 2010-12-14 DIAGNOSIS — J209 Acute bronchitis, unspecified: Secondary | ICD-10-CM

## 2010-12-14 DIAGNOSIS — R062 Wheezing: Secondary | ICD-10-CM

## 2010-12-14 MED ORDER — PREDNISONE 10 MG PO TABS: 10.0000 mg | ORAL_TABLET | Freq: Every day | ORAL | Status: AC

## 2010-12-14 MED ORDER — HYDROCODONE-HOMATROPINE 5-1.5 MG/5ML PO SYRP: 5.0000 mL | ORAL_SOLUTION | Freq: Four times a day (QID) | ORAL | Status: AC | PRN

## 2010-12-14 MED ORDER — AZITHROMYCIN 250 MG PO TABS: ORAL_TABLET | ORAL | Status: AC

## 2010-12-14 NOTE — Progress Notes (Signed)
Subjective:    Patient ID: [name], male    DOB: [birth year], 65 y.o.   MRN: [id number]  HPI  Here with acute onset mild to mod 2-3 days ST, HA, general weakness and malaise, with prod cough greenish sputum, but Pt denies chest pain, increased sob or doe, wheezing, orthopnea, PND, increased LE swelling, palpitations, dizziness or syncope, until today with onset mild wheezing/sob as well.  Pt denies new neurological symptoms such as new headache, or facial or extremity weakness or numbness   Pt denies polydipsia, polyuria.   Pt denies fever, wt loss, night sweats, loss of appetite, or other constitutional symptoms, except for the above. Past Medical History  Diagnosis Date  . Hypertension   . Hyperlipidemia   . History of colon cancer   . Left knee DJD   . Diverticulosis of colon    Past Surgical History  Procedure Date  . Colon surgery 200  . Knee arthroscopy 2007    LT- [facility] [facility]    reports that he quit smoking about 22 years ago. His smoking use included Cigarettes. He smoked 1 pack per day. He does not have any smokeless tobacco history on file. He reports that he drinks alcohol. He reports that he does not use illicit drugs. family history includes Cancer in his brother, maternal grandmother, and mother and Diabetes in his brother. Allergies  Allergen Reactions  . Pregabalin     REACTION: felt bad   Current Outpatient Prescriptions on File Prior to Visit  Medication Sig Dispense Refill  . amLODipine (NORVASC) 10 MG tablet TAKE ONE TABLET BY MOUTH EVERY DAY  90 tablet  3  . aspirin EC 81 MG tablet Take 81 mg by mouth daily.        . benazepril (LOTENSIN) 40 MG tablet TAKE ONE TABLET BY MOUTH EVERY DAY  90 tablet  3  . diclofenac (VOLTAREN) 75 MG EC tablet TAKE ONE TABLET BY MOUTH TWICE DAILY AS NEEDED FOR PAIN  60 tablet  6  . hydrochlorothiazide 25 MG tablet TAKE ONE TABLET BY MOUTH EVERY DAY  100 tablet  3  . pravastatin (PRAVACHOL) 40 MG tablet TAKE TWO TABLETS BY MOUTH  EVERY DAY  180 tablet  3  . traMADol (ULTRAM) 50 MG tablet Take 1 tablet (50 mg total) by mouth every 6 (six) hours as needed for pain.  240 tablet  2   Review of Systems Review of Systems  Constitutional: Negative for diaphoresis and unexpected weight change.  HENT: Negative for drooling and tinnitus.   Eyes: Negative for photophobia and visual disturbance.  Respiratory: Negative for choking and stridor.   Gastrointestinal: Negative for vomiting and blood in stool.  Genitourinary: Negative for hematuria and decreased urine volume.    Objective:   Physical Exam BP 122/64  Pulse 78  Temp(Src) 98.6 F (37 C) (Oral)  Ht 5\' 6"  (1.676 m)  Wt 257 lb (116.574 kg)  BMI 41.48 kg/m2  SpO2 95% Physical Exam  VS noted, mild ill Constitutional: Pt appears well-developed and well-nourished.  HENT: Head: Normocephalic.  Right Ear: External ear normal.  Left Ear: External ear normal.  Bilat tm's mild erythema.  Sinus nontender.  Pharynx mild erythema Eyes: Conjunctivae and EOM are normal. Pupils are equal, round, and reactive to light.  Neck: Normal range of motion. Neck supple.  Cardiovascular: Normal rate and regular rhythm.   Pulmonary/Chest: Effort normal and breath sounds decreased bilat with mild wheeze.  Neurological: Pt is alert. No cranial nerve  deficit.  Skin: Skin is warm. No erythema.  Psychiatric: Pt behavior is normal. Thought content normal.     Assessment & Plan:

## 2010-12-14 NOTE — Assessment & Plan Note (Signed)
Mild to mod, for antibx course,  to f/u any worsening symptoms or concerns 

## 2010-12-14 NOTE — Assessment & Plan Note (Signed)
Mild to mod, for predpack asd,  to f/u any worsening symptoms or concerns 

## 2010-12-14 NOTE — Assessment & Plan Note (Signed)
stable overall by hx and exam, most recent data reviewed with pt, and pt to continue medical treatment as before  BP Readings from Last 3 Encounters:  12/14/10 122/64  04/25/10 138/74  06/08/09 140/60

## 2010-12-14 NOTE — Patient Instructions (Addendum)
Take all new medications as prescribed Continue all other medications as before Please return in 6 months, or sooner if needed 

## 2011-02-08 ENCOUNTER — Other Ambulatory Visit: Payer: Self-pay | Admitting: Internal Medicine

## 2011-02-08 NOTE — Telephone Encounter (Signed)
Done per emr 

## 2011-06-20 ENCOUNTER — Encounter: Payer: Self-pay | Admitting: Internal Medicine

## 2011-06-20 ENCOUNTER — Ambulatory Visit (INDEPENDENT_AMBULATORY_CARE_PROVIDER_SITE_OTHER): Payer: Medicare Other | Admitting: Internal Medicine

## 2011-06-20 VITALS — BP 140/68 | HR 74 | Temp 97.4°F | Ht 66.0 in | Wt 268.5 lb

## 2011-06-20 DIAGNOSIS — J069 Acute upper respiratory infection, unspecified: Secondary | ICD-10-CM

## 2011-06-20 DIAGNOSIS — N529 Male erectile dysfunction, unspecified: Secondary | ICD-10-CM | POA: Insufficient documentation

## 2011-06-20 DIAGNOSIS — I10 Essential (primary) hypertension: Secondary | ICD-10-CM

## 2011-06-20 MED ORDER — VARDENAFIL HCL 20 MG PO TABS: 20.0000 mg | ORAL_TABLET | ORAL | Status: DC | PRN

## 2011-06-20 MED ORDER — AZITHROMYCIN 250 MG PO TABS: ORAL_TABLET | ORAL | Status: AC

## 2011-06-20 NOTE — Patient Instructions (Signed)
Take all new medications as prescribed - the antibiotic (sent to the pharmacy), and the levitra given hardcopy (less expensive at [facility]) Continue all other medications as before You can also take Delsym OTC for cough, and/or Mucinex (or it's generic off brand) for congestion

## 2011-06-20 NOTE — Assessment & Plan Note (Signed)
Ok for levitra prn,  to f/u any worsening symptoms or concerns 

## 2011-06-20 NOTE — Assessment & Plan Note (Signed)
stable overall by hx and exam, most recent data reviewed with pt, and pt to continue medical treatment as before  BP Readings from Last 3 Encounters:  06/20/11 140/68  12/14/10 122/64  04/25/10 138/74

## 2011-06-20 NOTE — Assessment & Plan Note (Signed)
Mild to mod, for antibx course,  to f/u any worsening symptoms or concerns 

## 2011-06-20 NOTE — Progress Notes (Signed)
Subjective:    Patient ID: Aaron Moss, male    DOB: October 24, 1945, 66 y.o.   MRN: [id number]  HPI   Here with 2 days acute onset fever, severe ST, general weakness and malaise, but little to no cough and Pt denies chest pain, increased sob or doe, wheezing, orthopnea, PND, increased LE swelling, palpitations, dizziness or syncope.  Pt denies new neurological symptoms such as new headache, or facial or extremity weakness or numbness   Pt denies polydipsia, polyuria.   Pt denies fever, wt loss, night sweats, loss of appetite, or other constitutional symptoms.  Also with worsening ED symtpoms for the past 4-5 mo without med change.  Denies worsening depressive symptoms, suicidal ideation, or panic. Past Medical History  Diagnosis Date  . Hypertension   . Hyperlipidemia   . History of colon cancer   . Left knee DJD   . Diverticulosis of colon    Past Surgical History  Procedure Date  . Colon surgery 200  . Knee arthroscopy 2007    LT- [place] [facility]    reports that he quit smoking about 23 years ago. His smoking use included Cigarettes. He smoked 1 pack per day. He does not have any smokeless tobacco history on file. He reports that he drinks alcohol. He reports that he does not use illicit drugs. family history includes Cancer in his brother, maternal grandmother, and mother and Diabetes in his brother. Allergies  Allergen Reactions  . Pregabalin     REACTION: felt bad   Current Outpatient Prescriptions on File Prior to Visit  Medication Sig Dispense Refill  . amLODipine (NORVASC) 10 MG tablet TAKE ONE TABLET BY MOUTH EVERY DAY  90 tablet  3  . aspirin EC 81 MG tablet Take 81 mg by mouth daily.        . benazepril (LOTENSIN) 40 MG tablet TAKE ONE TABLET BY MOUTH EVERY DAY  90 tablet  3  . diclofenac (VOLTAREN) 75 MG EC tablet TAKE ONE TABLET BY MOUTH TWICE DAILY AS NEEDED FOR PAIN  60 tablet  6  . hydrochlorothiazide 25 MG tablet TAKE ONE TABLET BY MOUTH EVERY DAY  100 tablet  3  .  pravastatin (PRAVACHOL) 40 MG tablet TAKE TWO TABLETS BY MOUTH EVERY DAY  180 tablet  3  . traMADol (ULTRAM) 50 MG tablet TAKE ONE TABLET BY MOUTH EVERY 6 HOURS AS NEEDED FOR PAIN  240 tablet  1  . vardenafil (LEVITRA) 20 MG tablet Take 1 tablet (20 mg total) by mouth as needed for erectile dysfunction.  5 tablet  11   Review of Systems Review of Systems  Constitutional: Negative for diaphoresis and unexpected weight change.  HENT: Negative for drooling and tinnitus.   Eyes: Negative for photophobia and visual disturbance.  Respiratory: Negative for choking and stridor.   Gastrointestinal: Negative for vomiting and blood in stool.  Genitourinary: Negative for hematuria and decreased urine volume.    Objective:   Physical Exam BP 140/68  Pulse 74  Temp(Src) 97.4 F (36.3 C) (Oral)  Ht 5\' 6"  (1.676 m)  Wt 268 lb 8 oz (121.791 kg)  BMI 43.34 kg/m2  SpO2 97% Physical Exam  VS noted, mild ill Constitutional: Pt appears well-developed and well-nourished.  HENT: Head: Normocephalic.  Right Ear: External ear normal.  Left Ear: External ear normal.  Bilat tm's mild erythema.  Sinus nontender.  Pharynx mod to severe erythema Eyes: Conjunctivae and EOM are normal. Pupils are equal, round, and reactive to light.  Neck: Normal range of motion. Neck supple.  Cardiovascular: Normal rate and regular rhythm.   Pulmonary/Chest: Effort normal and breath sounds normal.  Skin: Skin is warm. No erythema.  Psychiatric: Pt behavior is normal. Thought content normal.     Assessment & Plan:

## 2011-07-19 ENCOUNTER — Other Ambulatory Visit: Payer: Self-pay | Admitting: Internal Medicine

## 2011-07-19 NOTE — Telephone Encounter (Signed)
Done per emr 

## 2011-08-13 ENCOUNTER — Other Ambulatory Visit: Payer: Self-pay | Admitting: Internal Medicine

## 2011-08-16 ENCOUNTER — Other Ambulatory Visit: Payer: Self-pay

## 2011-08-16 MED ORDER — HYDROCHLOROTHIAZIDE 25 MG PO TABS: 25.0000 mg | ORAL_TABLET | Freq: Every day | ORAL | Status: DC

## 2011-08-26 ENCOUNTER — Other Ambulatory Visit: Payer: Self-pay | Admitting: Internal Medicine

## 2011-08-28 NOTE — Telephone Encounter (Signed)
Done erx 

## 2011-08-28 NOTE — Telephone Encounter (Signed)
Voltaren request [last refill 04.23.12 #[id number]]/SLS Please advise.

## 2011-08-29 ENCOUNTER — Other Ambulatory Visit (INDEPENDENT_AMBULATORY_CARE_PROVIDER_SITE_OTHER): Payer: Medicare Other

## 2011-08-29 ENCOUNTER — Encounter: Payer: Self-pay | Admitting: Internal Medicine

## 2011-08-29 ENCOUNTER — Ambulatory Visit (INDEPENDENT_AMBULATORY_CARE_PROVIDER_SITE_OTHER): Payer: Medicare Other | Admitting: Internal Medicine

## 2011-08-29 VITALS — BP 122/70 | HR 62 | Temp 97.1°F | Ht 66.0 in | Wt 266.0 lb

## 2011-08-29 DIAGNOSIS — M25562 Pain in left knee: Secondary | ICD-10-CM

## 2011-08-29 DIAGNOSIS — I10 Essential (primary) hypertension: Secondary | ICD-10-CM

## 2011-08-29 DIAGNOSIS — M25561 Pain in right knee: Secondary | ICD-10-CM | POA: Insufficient documentation

## 2011-08-29 DIAGNOSIS — N320 Bladder-neck obstruction: Secondary | ICD-10-CM

## 2011-08-29 DIAGNOSIS — E785 Hyperlipidemia, unspecified: Secondary | ICD-10-CM

## 2011-08-29 DIAGNOSIS — N529 Male erectile dysfunction, unspecified: Secondary | ICD-10-CM

## 2011-08-29 DIAGNOSIS — M25569 Pain in unspecified knee: Secondary | ICD-10-CM

## 2011-08-29 LAB — URINALYSIS, ROUTINE W REFLEX MICROSCOPIC
Bilirubin Urine: NEGATIVE
Total Protein, Urine: NEGATIVE
Urine Glucose: NEGATIVE
pH: 5.5 (ref 5.0–8.0)

## 2011-08-29 LAB — PSA: PSA: 2.56 ng/mL (ref 0.10–4.00)

## 2011-08-29 LAB — BASIC METABOLIC PANEL
Calcium: 9.4 mg/dL (ref 8.4–10.5)
Creatinine, Ser: 1.2 mg/dL (ref 0.4–1.5)
GFR: 78.25 mL/min (ref >60.00)

## 2011-08-29 LAB — CBC WITH DIFFERENTIAL/PLATELET
Basophils Relative: 0.7 % (ref 0.0–3.0)
Eosinophils Absolute: 0.2 10*3/uL (ref 0.0–0.7)
Eosinophils Relative: 2.1 % (ref 0.0–5.0)
Hemoglobin: 15.7 g/dL (ref 13.0–17.0)
MCHC: 33 g/dL (ref 30.0–36.0)
MCV: 82.2 fl (ref 78.0–100.0)
Monocytes Absolute: 0.5 10*3/uL (ref 0.1–1.0)
Neutro Abs: 6.6 10*3/uL (ref 1.4–7.7)
RBC: 5.8 Mil/uL (ref 4.22–5.81)

## 2011-08-29 LAB — HEPATIC FUNCTION PANEL
Bilirubin, Direct: 0.2 mg/dL (ref 0.0–0.3)
Total Bilirubin: 0.9 mg/dL (ref 0.3–1.2)

## 2011-08-29 MED ORDER — VARDENAFIL HCL 20 MG PO TABS: 20.0000 mg | ORAL_TABLET | ORAL | Status: DC | PRN

## 2011-08-29 MED ORDER — TRAMADOL HCL 50 MG PO TABS: 50.0000 mg | ORAL_TABLET | Freq: Four times a day (QID) | ORAL | Status: DC | PRN

## 2011-08-29 NOTE — Patient Instructions (Addendum)
Continue all other medications as before Your levitra and tramadol were refilled today Please have the pharmacy call with any refills you may need. You will be contacted regarding the referral for: [facility] Please go to LAB in the Basement for the blood and/or urine tests to be done today You will be contacted by phone if any changes need to be made immediately.  Otherwise, you will receive a letter about your results with an explanation. Please return in 1 year for your yearly visit, or sooner if needed

## 2011-08-29 NOTE — Assessment & Plan Note (Signed)
stable overall by hx and exam, most recent data reviewed with pt, and pt to continue medical treatment as before Lab Results  Component Value Date   WBC 7.8 04/25/2010   HGB 15.1 04/25/2010   HCT 44.6 04/25/2010   PLT 213.0 04/25/2010   GLUCOSE 92 04/25/2010   CHOL 142 04/25/2010   TRIG 81.0 04/25/2010   HDL 34.50* 04/25/2010   LDLDIRECT 185.6 05/15/2007   LDLCALC 91 04/25/2010   ALT 15 04/25/2010   AST 17 04/25/2010   NA 143 04/25/2010   K 3.5 04/25/2010   CL 104 04/25/2010   CREATININE 1.22 07/04/2010   BUN 18 04/25/2010   CO2 32 04/25/2010   TSH 1.32 04/25/2010   PSA 1.85 04/25/2010   Lab Results  Component Value Date   LDLCALC 91 04/25/2010   For lipid today

## 2011-08-29 NOTE — Progress Notes (Signed)
Subjective:    Patient ID: Aaron Moss, male    DOB: Mar 01, 1946, 66 y.o.   MRN: [id number]  HPI  Here to f/u - up to dec 2012 had been walking up to 5 miles per day, but more like 2 miles now but to taking a break from it, but also with bilat knee pain and swelling; but no giveaway or falls.  Worse to walk but even "pops" with turning over in bed.  Nothing makes better but sitting.  S/o left knee arthroscopy, and has had cortisone shot over 1 yr per ortho at [facility].  No prior right knee cortsone, or surgury. No fever, trauma. Stiff bilat in the AM, then more pain during the day, and worse after walking with more sweling.  Right pain 6/10, left pain 4/10 at worst, both ok with tramadol as he takes now.  Does not want to return to [facility], wife did well at [facility].  Pt denies chest pain, increased sob or doe, wheezing, orthopnea, PND, increased LE swelling, palpitations, dizziness or syncope.   Pt denies polydipsia, polyuria, trying to follow lower chol diet. Pt denies new neurological symptoms such as new headache, or facial or extremity weakness or numbness  Pt denies fever, wt loss, night sweats, loss of appetite, or other constitutional symptoms Past Medical History  Diagnosis Date  . Hypertension   . Hyperlipidemia   . History of colon cancer   . Left knee DJD   . Diverticulosis of colon    Past Surgical History  Procedure Date  . Colon surgery 200  . Knee arthroscopy 2007    LT- [facility]    reports that he quit smoking about 23 years ago. His smoking use included Cigarettes. He smoked 1 pack per day. He does not have any smokeless tobacco history on file. He reports that he drinks alcohol. He reports that he does not use illicit drugs. family history includes Cancer in his brother, maternal grandmother, and mother and Diabetes in his brother. Allergies  Allergen Reactions  . Pregabalin     REACTION: felt bad   Current Outpatient Prescriptions on File Prior to Visit    Medication Sig Dispense Refill  . amLODipine (NORVASC) 10 MG tablet TAKE ONE TABLET BY MOUTH EVERY DAY  90 tablet  3  . aspirin EC 81 MG tablet Take 81 mg by mouth daily.        . benazepril (LOTENSIN) 40 MG tablet TAKE ONE TABLET BY MOUTH EVERY DAY  90 tablet  3  . diclofenac (VOLTAREN) 75 MG EC tablet TAKE ONE TABLET BY MOUTH TWICE DAILY AS NEEDED FOR PAIN  60 tablet  3  . hydrochlorothiazide (HYDRODIURIL) 25 MG tablet Take 1 tablet (25 mg total) by mouth daily.  100 tablet  3  . pravastatin (PRAVACHOL) 40 MG tablet TAKE TWO TABLETS BY MOUTH EVERY DAY  180 tablet  3  . traMADol (ULTRAM) 50 MG tablet TAKE ONE TABLET BY MOUTH EVERY 6 HOURS AS NEEDED FOR PAIN  120 tablet  2  . vardenafil (LEVITRA) 20 MG tablet Take 1 tablet (20 mg total) by mouth as needed for erectile dysfunction.  5 tablet  11   Review of Systems Review of Systems  Constitutional: Negative for diaphoresis and unexpected weight change.  HENT: Negative for drooling and tinnitus.   Eyes: Negative for photophobia and visual disturbance.  Respiratory: Negative for choking and stridor.   Gastrointestinal: Negative for vomiting and blood in stool.  Genitourinary:  Negative for hematuria and decreased urine volume.  Musculoskeletal: Negative for other joint pain or swelling Skin: Negative for color change and wound.  Neurological: Negative for tremors and numbness.  Psychiatric/Behavioral: Negative for decreased concentration. The patient is not hyperactive.      Objective:   Physical Exam BP 122/70  Pulse 62  Temp(Src) 97.1 F (36.2 C) (Oral)  Ht 5\' 6"  (1.676 m)  Wt 266 lb (120.657 kg)  BMI 42.93 kg/m2  SpO2 95% Physical Exam  VS noted Constitutional: Pt appears well-developed and well-nourished.  HENT: Head: Normocephalic.  Right Ear: External ear normal.  Left Ear: External ear normal.  Eyes: Conjunctivae and EOM are normal. Pupils are equal, round, and reactive to light.  Neck: Normal range of motion. Neck  supple.  Cardiovascular: Normal rate and regular rhythm.   Pulmonary/Chest: Effort normal and breath sounds normal.  Abd:  Soft, NT, non-distended, + BS Neurological: Pt is alert. Not confused, motor/sens/gait intact Skin: Skin is warm. No erythema.  Left knee with 1+ effusion, mild crepitus, NT, FROM Right knee with trace effusion, less crepitus, NT, FROM Psychiatric: Pt behavior is normal. Thought content normal. not nervous or depressed affect    Assessment & Plan:

## 2011-08-29 NOTE — Assessment & Plan Note (Signed)
asympt - also for PSA as he is due

## 2011-08-29 NOTE — Assessment & Plan Note (Signed)
prob worsening DJD with effusion/pain; to cont tramadol, refer [facility],  to f/u any worsening symptoms or concerns

## 2011-08-29 NOTE — Assessment & Plan Note (Signed)
stable overall by hx and exam, most recent data reviewed with pt, and pt to continue medical treatment as before BP Readings from Last 3 Encounters:  08/29/11 122/70  06/20/11 140/68  12/14/10 122/64   For labs today

## 2011-09-13 ENCOUNTER — Other Ambulatory Visit: Payer: Self-pay

## 2011-09-13 MED ORDER — AMLODIPINE BESYLATE 10 MG PO TABS: 10.0000 mg | ORAL_TABLET | Freq: Every day | ORAL | Status: DC

## 2012-02-20 ENCOUNTER — Other Ambulatory Visit: Payer: Self-pay | Admitting: Internal Medicine

## 2012-04-03 ENCOUNTER — Other Ambulatory Visit: Payer: Self-pay | Admitting: Internal Medicine

## 2012-04-03 NOTE — Telephone Encounter (Signed)
Done erx 

## 2012-05-25 ENCOUNTER — Encounter: Payer: Self-pay | Admitting: Internal Medicine

## 2012-05-31 ENCOUNTER — Encounter: Payer: Self-pay | Admitting: Internal Medicine

## 2012-07-02 ENCOUNTER — Other Ambulatory Visit: Payer: Self-pay | Admitting: Internal Medicine

## 2012-07-02 NOTE — Telephone Encounter (Signed)
Done erx 

## 2012-07-24 ENCOUNTER — Other Ambulatory Visit: Payer: Self-pay | Admitting: Internal Medicine

## 2012-08-16 ENCOUNTER — Other Ambulatory Visit: Payer: Self-pay | Admitting: Internal Medicine

## 2012-09-04 ENCOUNTER — Other Ambulatory Visit: Payer: Self-pay | Admitting: Internal Medicine

## 2012-09-04 ENCOUNTER — Encounter: Payer: Self-pay | Admitting: Internal Medicine

## 2012-09-04 ENCOUNTER — Ambulatory Visit (INDEPENDENT_AMBULATORY_CARE_PROVIDER_SITE_OTHER): Payer: Medicare Other | Admitting: Internal Medicine

## 2012-09-04 ENCOUNTER — Other Ambulatory Visit (INDEPENDENT_AMBULATORY_CARE_PROVIDER_SITE_OTHER): Payer: Medicare Other

## 2012-09-04 VITALS — BP 130/70 | HR 69 | Temp 98.0°F | Ht 66.0 in | Wt 264.5 lb

## 2012-09-04 DIAGNOSIS — E876 Hypokalemia: Secondary | ICD-10-CM

## 2012-09-04 DIAGNOSIS — Z0000 Encounter for general adult medical examination without abnormal findings: Secondary | ICD-10-CM

## 2012-09-04 DIAGNOSIS — I10 Essential (primary) hypertension: Secondary | ICD-10-CM

## 2012-09-04 DIAGNOSIS — N320 Bladder-neck obstruction: Secondary | ICD-10-CM

## 2012-09-04 DIAGNOSIS — E785 Hyperlipidemia, unspecified: Secondary | ICD-10-CM

## 2012-09-04 DIAGNOSIS — Z136 Encounter for screening for cardiovascular disorders: Secondary | ICD-10-CM

## 2012-09-04 LAB — CBC WITH DIFFERENTIAL/PLATELET
Basophils Absolute: 0.1 10*3/uL (ref 0.0–0.1)
Basophils Relative: 0.6 % (ref 0.0–3.0)
Eosinophils Relative: 2.5 % (ref 0.0–5.0)
HCT: 45 % (ref 39.0–52.0)
Hemoglobin: 15.1 g/dL (ref 13.0–17.0)
Lymphocytes Relative: 22.9 % (ref 12.0–46.0)
Lymphs Abs: 2 10*3/uL (ref 0.7–4.0)
Monocytes Relative: 5.7 % (ref 3.0–12.0)
Neutro Abs: 6 10*3/uL (ref 1.4–7.7)
RBC: 5.52 Mil/uL (ref 4.22–5.81)
WBC: 8.8 10*3/uL (ref 4.5–10.5)

## 2012-09-04 LAB — URINALYSIS, ROUTINE W REFLEX MICROSCOPIC
Ketones, ur: NEGATIVE
Specific Gravity, Urine: 1.025 (ref 1.000–1.030)
Urine Glucose: NEGATIVE
Urobilinogen, UA: 0.2 (ref 0.0–1.0)
pH: 6 (ref 5.0–8.0)

## 2012-09-04 LAB — BASIC METABOLIC PANEL
BUN: 15 mg/dL (ref 6–23)
CO2: 29 mEq/L (ref 19–32)
Calcium: 8.7 mg/dL (ref 8.4–10.5)
Creatinine, Ser: 1.1 mg/dL (ref 0.4–1.5)
GFR: 84.54 mL/min (ref >60.00)
Glucose, Bld: 108 mg/dL — ABNORMAL HIGH (ref 70–99)
Sodium: 140 mEq/L (ref 135–145)

## 2012-09-04 LAB — LIPID PANEL
HDL: 44.5 mg/dL (ref >39.00)
Triglycerides: 90 mg/dL (ref 0.0–149.0)
VLDL: 18 mg/dL (ref 0.0–40.0)

## 2012-09-04 LAB — HEPATIC FUNCTION PANEL
Albumin: 4 g/dL (ref 3.5–5.2)
Alkaline Phosphatase: 93 U/L (ref 39–117)
Total Protein: 7.6 g/dL (ref 6.0–8.3)

## 2012-09-04 LAB — PSA: PSA: 3.08 ng/mL (ref 0.10–4.00)

## 2012-09-04 MED ORDER — POTASSIUM CHLORIDE ER 10 MEQ PO TBCR: 10.0000 meq | EXTENDED_RELEASE_TABLET | Freq: Every day | ORAL | Status: DC

## 2012-09-04 NOTE — Progress Notes (Signed)
Subjective:    Patient ID: Aaron Moss, male    DOB: 1945-08-25, 67 y.o.   MRN: [id number]  HPI  Here to f/u; overall doing ok,  Pt denies chest pain, increased sob or doe, wheezing, orthopnea, PND, increased LE swelling, palpitations, dizziness or syncope.  Pt denies polydipsia, polyuria, or low sugar symptoms such as weakness or confusion improved with po intake.  Pt denies new neurological symptoms such as new headache, or facial or extremity weakness or numbness.   Pt states overall good compliance with meds, has been trying to follow lower cholesterol diet, with wt overall stable,  but little exercise however. Does c/o ongoing fatigue, but denies signficant daytime hypersomnolence.Seeing [facility] for knees with left more sore with walking lately, needs another cortisone per pt Past Medical History  Diagnosis Date  . Hypertension   . Hyperlipidemia   . History of colon cancer   . Left knee DJD   . Diverticulosis of colon    Past Surgical History  Procedure Laterality Date  . Colon surgery  2000    colon cancer  . Knee arthroscopy  2007    LT- [facility]  . Colonoscopy    . Eus      pancreatic cyst    reports that he quit smoking about 24 years ago. His smoking use included Cigarettes. He smoked 1.00 pack per day. He does not have any smokeless tobacco history on file. He reports that  drinks alcohol. He reports that he does not use illicit drugs. family history includes Cancer in his brother, maternal grandmother, and mother and Diabetes in his brother. Allergies  Allergen Reactions  . Pregabalin     REACTION: felt bad   Current Outpatient Prescriptions on File Prior to Visit  Medication Sig Dispense Refill  . amLODipine (NORVASC) 10 MG tablet Take 1 tablet (10 mg total) by mouth daily.  90 tablet  3  . aspirin EC 81 MG tablet Take 81 mg by mouth daily.        . benazepril (LOTENSIN) 40 MG tablet TAKE ONE TABLET BY MOUTH EVERY DAY  90 tablet  0  . diclofenac (VOLTAREN)  75 MG EC tablet TAKE ONE TABLET BY MOUTH TWICE DAILY AS NEEDED FOR PAIN  60 tablet  3  . hydrochlorothiazide (HYDRODIURIL) 25 MG tablet Take 1 tablet (25 mg total) by mouth daily.  100 tablet  3  . pravastatin (PRAVACHOL) 40 MG tablet TAKE TWO TABLETS BY MOUTH EVERY DAY  60 tablet  1  . traMADol (ULTRAM) 50 MG tablet TAKE ONE TABLET BY MOUTH EVERY 6 HOURS AS NEEDED FOR PAIN  120 tablet  2  . vardenafil (LEVITRA) 20 MG tablet Take 1 tablet (20 mg total) by mouth as needed for erectile dysfunction.  5 tablet  11   No current facility-administered medications on file prior to visit.   Review of Systems  Constitutional: Negative for unexpected weight change, or unusual diaphoresis  HENT: Negative for tinnitus.   Eyes: Negative for photophobia and visual disturbance.  Respiratory: Negative for choking and stridor.   Gastrointestinal: Negative for vomiting and blood in stool.  Genitourinary: Negative for hematuria and decreased urine volume.  Musculoskeletal: Negative for acute joint swelling Skin: Negative for color change and wound.  Neurological: Negative for tremors and numbness other than noted  Psychiatric/Behavioral: Negative for decreased concentration or  hyperactivity.       Objective:   Physical Exam BP 130/70  Pulse 69  Temp(Src) 98 F (  36.7 C) (Oral)  Ht 5\' 6"  (1.676 m)  Wt 264 lb 8 oz (119.976 kg)  BMI 42.71 kg/m2  SpO2 95% VS noted,  Constitutional: Pt appears well-developed and well-nourished.  HENT: Head: NCAT.  Right Ear: External ear normal.  Left Ear: External ear normal.  Eyes: Conjunctivae and EOM are normal. Pupils are equal, round, and reactive to light.  Neck: Normal range of motion. Neck supple.  Cardiovascular: Normal rate and regular rhythm.   Pulmonary/Chest: Effort normal and breath sounds normal.  Abd:  Soft, NT, non-distended, + BS Neurological: Pt is alert. Not confused  Skin: Skin is warm. No erythema.  Psychiatric: Pt behavior is normal. Thought  content normal.  Left knee with trace effusion, NT, + crepitus    Assessment & Plan:

## 2012-09-04 NOTE — Assessment & Plan Note (Signed)
stable overall by history and exam, recent data reviewed with pt, and pt to continue medical treatment as before,  to f/u any worsening symptoms or concerns Lab Results  Component Value Date   LDLCALC 91 04/25/2010   For labs today

## 2012-09-04 NOTE — Assessment & Plan Note (Signed)
Asympt, for PSA as he is due 

## 2012-09-04 NOTE — Assessment & Plan Note (Signed)
Most recent has been stable, for fu today as well

## 2012-09-04 NOTE — Patient Instructions (Addendum)
Your EKG was OK today Please continue all other medications as before, and refills have been done if requested. Please have the pharmacy call with any other refills you may need. Please continue your efforts at being more active, low cholesterol diet, and weight control. You are otherwise up to date with prevention measures today. Please keep your appointments with your specialists as you have planned - orthopedic for the left knee  Please go to the LAB in the Basement (turn left off the elevator) for the tests to be done today You will be contacted by phone if any changes need to be made immediately.  Otherwise, you will receive a letter about your results with an explanation  Please remember to sign up for My Chart if you have not done so, as this will be important to you in the future with finding out test results, communicating by private email, and scheduling acute appointments online when needed.  You will be contacted regarding the referral for: colonoscopy  Please return in 6 months, or sooner if needed

## 2012-09-04 NOTE — Assessment & Plan Note (Addendum)
stable overall by history and exam, recent data reviewed with pt, and pt to continue medical treatment as before,  to f/u any worsening symptoms or concerns BP Readings from Last 3 Encounters:  09/04/12 130/70  08/29/11 122/70  06/20/11 140/68   For labs today, ECG reviewed as per emr

## 2012-09-05 ENCOUNTER — Encounter: Payer: Self-pay | Admitting: Internal Medicine

## 2012-09-19 ENCOUNTER — Other Ambulatory Visit: Payer: Self-pay | Admitting: Internal Medicine

## 2012-09-24 ENCOUNTER — Other Ambulatory Visit: Payer: Self-pay | Admitting: Internal Medicine

## 2012-10-24 ENCOUNTER — Ambulatory Visit (AMBULATORY_SURGERY_CENTER): Payer: Medicare Other

## 2012-10-24 ENCOUNTER — Encounter: Payer: Self-pay | Admitting: Internal Medicine

## 2012-10-24 VITALS — Ht 66.0 in | Wt 268.6 lb

## 2012-10-24 DIAGNOSIS — Z85038 Personal history of other malignant neoplasm of large intestine: Secondary | ICD-10-CM

## 2012-10-24 MED ORDER — SUPREP BOWEL PREP KIT 17.5-3.13-1.6 GM/177ML PO SOLN: 1.0000 | Freq: Once | ORAL | Status: DC

## 2012-11-07 ENCOUNTER — Encounter: Payer: Self-pay | Admitting: Internal Medicine

## 2012-11-07 ENCOUNTER — Ambulatory Visit (AMBULATORY_SURGERY_CENTER): Payer: Medicare Other | Admitting: Internal Medicine

## 2012-11-07 VITALS — BP 143/73 | HR 65 | Temp 98.7°F | Resp 25 | Ht 66.0 in | Wt 268.0 lb

## 2012-11-07 DIAGNOSIS — D126 Benign neoplasm of colon, unspecified: Secondary | ICD-10-CM

## 2012-11-07 DIAGNOSIS — Z85038 Personal history of other malignant neoplasm of large intestine: Secondary | ICD-10-CM

## 2012-11-07 DIAGNOSIS — K5730 Diverticulosis of large intestine without perforation or abscess without bleeding: Secondary | ICD-10-CM

## 2012-11-07 MED ORDER — SODIUM CHLORIDE 0.9 % IV SOLN: 500.0000 mL | INTRAVENOUS | Status: DC

## 2012-11-07 NOTE — Progress Notes (Signed)
Patient did not experience any of the following events: a burn prior to discharge; a fall within the facility; wrong site/side/patient/procedure/implant event; or a hospital transfer or hospital admission upon discharge from the facility. (G8907) Patient did not have preoperative order for IV antibiotic SSI prophylaxis. (G8918)  

## 2012-11-07 NOTE — Patient Instructions (Addendum)
I found and removed one small polyp that looks benign. You also still have diverticulosis.  Otherwise normal. Excellent prep.  I will let you know pathology results and when to have another routine colonoscopy by mail.  I appreciate the opportunity to care for you. Iva Boop, MD, FACG   YOU HAD AN ENDOSCOPIC PROCEDURE TODAY AT THE [facility]: Refer to the procedure report that was given to you for any specific questions about what was found during the examination.  If the procedure report does not answer your questions, please call your gastroenterologist to clarify.  If you requested that your care partner not be given the details of your procedure findings, then the procedure report has been included in a sealed envelope for you to review at your convenience later.  YOU SHOULD EXPECT: Some feelings of bloating in the abdomen. Passage of more gas than usual.  Walking can help get rid of the air that was put into your GI tract during the procedure and reduce the bloating. If you had a lower endoscopy (such as a colonoscopy or flexible sigmoidoscopy) you may notice spotting of blood in your stool or on the toilet paper. If you underwent a bowel prep for your procedure, then you may not have a normal bowel movement for a few days.  DIET: Your first meal following the procedure should be a light meal and then it is ok to progress to your normal diet.  A half-sandwich or bowl of soup is an example of a good first meal.  Heavy or fried foods are harder to digest and may make you feel nauseous or bloated.  Likewise meals heavy in dairy and vegetables can cause extra gas to form and this can also increase the bloating.  Drink plenty of fluids but you should avoid alcoholic beverages for 24 hours.  ACTIVITY: Your care partner should take you home directly after the procedure.  You should plan to take it easy, moving slowly for the rest of the day.  You can resume normal activity the day  after the procedure however you should NOT DRIVE or use heavy machinery for 24 hours (because of the sedation medicines used during the test).    SYMPTOMS TO REPORT IMMEDIATELY: A gastroenterologist can be reached at any hour.  During normal business hours, 8:30 AM to 5:00 PM Monday through Friday, call [phone].  After hours and on weekends, please call the GI answering service at [phone] who will take a message and have the physician on call contact you.   Following lower endoscopy (colonoscopy or flexible sigmoidoscopy):  Excessive amounts of blood in the stool  Significant tenderness or worsening of abdominal pains  Swelling of the abdomen that is new, acute  Fever of 100F or higher   FOLLOW UP: If any biopsies were taken you will be contacted by phone or by letter within the next 1-3 weeks.  Call your gastroenterologist if you have not heard about the biopsies in 3 weeks.  Our staff will call the home number listed on your records the next business day following your procedure to check on you and address any questions or concerns that you may have at that time regarding the information given to you following your procedure. This is a courtesy call and so if there is no answer at the home number and we have not heard from you through the emergency physician on call, we will assume that you have returned to your regular  daily activities without incident.  SIGNATURES/CONFIDENTIALITY: You and/or your care partner have signed paperwork which will be entered into your electronic medical record.  These signatures attest to the fact that that the information above on your After Visit Summary has been reviewed and is understood.  Full responsibility of the confidentiality of this discharge information lies with you and/or your care-partner.    INFORMATION ON POLYPS & DIVERTICULOSIS GIVEN TO YOU TODAY

## 2012-11-07 NOTE — Op Note (Signed)
[facility] [address] [place], [address]   COLONOSCOPY PROCEDURE REPORT  PATIENT: Aaron, Moss  MR#: [id number] BIRTHDATE: 1945/10/01 , 66  yrs. old GENDER: Male ENDOSCOPIST: Iva Boop, MD, [facility] PROCEDURE DATE:  11/07/2012 PROCEDURE:   Colonoscopy with snare polypectomy First Screening Colonoscopy - Avg.  risk and is 50 yrs.  old or older - No.  Prior Negative Screening - Now for repeat screening. N/A  History of Adenoma - Now for follow-up colonoscopy & has been > or = to 3 yrs.  Yes hx of adenoma.  Has been 3 or more years since last colonoscopy.  Polyps Removed Today? Yes. ASA CLASS:   Class III INDICATIONS:High risk patient with personal history of colon cancer. Proximal T3 N0M0 resected 000 MEDICATIONS: propofol (Diprivan) 250mg  IV, MAC sedation, administered by CRNA, and These medications were titrated to patient response per physician's verbal order  DESCRIPTION OF PROCEDURE:   After the risks benefits and alternatives of the procedure were thoroughly explained, informed consent was obtained.  A digital rectal exam revealed no abnormalities of the rectum, A digital rectal exam revealed no prostatic nodules, and A digital rectal exam revealed the prostate was not enlarged.   The LB CF-H180AL Loaner [id number]  endoscope was introduced through the anus and advanced to the cecum, which was identified by both the appendix and ileocecal valve. No adverse events experienced.   The quality of the prep was excellent using Suprep  The instrument was then slowly withdrawn as the colon was fully examined.   COLON FINDINGS: A sessile polyp measuring 6 mm in size was found in the sigmoid colon.  A polypectomy was performed with a cold snare. The resection was complete and the polyp tissue was completely retrieved.   Severe diverticulosis was noted throughout the entire examined colon.   There was evidence of a prior end-to-end colo-colonic surgical  anastomosis in the transverse colon.   The colon mucosa was otherwise normal.  Retroflexed views revealed no abnormalities. The time to cecum=1 minutes 09 seconds.  Withdrawal time=7 minutes 27 seconds.  The scope was withdrawn and the procedure completed. COMPLICATIONS: There were no complications.  ENDOSCOPIC IMPRESSION: 1.   Sessile polyp measuring 6 mm in size was found in the sigmoid colon; polypectomy was performed with a cold snare 2.   Severe diverticulosis was noted throughout the entire examined colon 3.   There was evidence of a prior colo-colonic surgical anastomosis in the transverse colon 4.   The colon mucosa was otherwise normal - excellent prep  RECOMMENDATIONS: Timing of repeat colonoscopy will be determined by pathology findings.  eSigned:  Iva Boop, MD, [facility] 11/07/2012 12:04 PM  cc: The Patient

## 2012-11-07 NOTE — Progress Notes (Signed)
Called to room to assist during endoscopic procedure.  Patient ID and intended procedure confirmed with present staff. Received instructions for my participation in the procedure from the performing physician.  

## 2012-11-08 ENCOUNTER — Telehealth: Payer: Self-pay | Admitting: *Deleted

## 2012-11-08 NOTE — Telephone Encounter (Signed)
  Follow up Call-  Call back number 11/07/2012  Post procedure Call Back phone  # [phone]  Permission to leave phone message Yes     Patient questions:  Do you have a fever, pain , or abdominal swelling? no Pain Score  0 *  Have you tolerated food without any problems? yes  Have you been able to return to your normal activities? yes  Do you have any questions about your discharge instructions: Diet   no Medications  no Follow up visit  no  Do you have questions or concerns about your Care? no  Actions: * If pain score is 4 or above: No action needed, pain <4.

## 2012-11-12 ENCOUNTER — Encounter: Payer: Self-pay | Admitting: Internal Medicine

## 2012-11-12 NOTE — Progress Notes (Signed)
Quick Note:  6 mm adenoma - repeat colonoscopy 2019 ______

## 2012-11-15 ENCOUNTER — Other Ambulatory Visit: Payer: Self-pay | Admitting: Internal Medicine

## 2012-11-17 ENCOUNTER — Other Ambulatory Visit: Payer: Self-pay | Admitting: Internal Medicine

## 2012-12-17 ENCOUNTER — Telehealth: Payer: Self-pay | Admitting: *Deleted

## 2012-12-17 MED ORDER — TRAMADOL HCL 50 MG PO TABS: ORAL_TABLET | ORAL | Status: DC

## 2012-12-17 MED ORDER — HYDROCHLOROTHIAZIDE 25 MG PO TABS: ORAL_TABLET | ORAL | Status: DC

## 2012-12-17 NOTE — Telephone Encounter (Signed)
Pt called requesting Tramadol refill.

## 2012-12-17 NOTE — Telephone Encounter (Signed)
Done hardcopy to [facility]  

## 2012-12-18 NOTE — Telephone Encounter (Signed)
Faxed hardcopy to [facility]

## 2013-03-05 ENCOUNTER — Other Ambulatory Visit (INDEPENDENT_AMBULATORY_CARE_PROVIDER_SITE_OTHER): Payer: Medicare Other

## 2013-03-05 ENCOUNTER — Encounter: Payer: Self-pay | Admitting: Internal Medicine

## 2013-03-05 ENCOUNTER — Ambulatory Visit (INDEPENDENT_AMBULATORY_CARE_PROVIDER_SITE_OTHER): Payer: Medicare Other | Admitting: Internal Medicine

## 2013-03-05 VITALS — BP 132/68 | HR 72 | Temp 97.8°F | Ht 66.0 in | Wt 262.8 lb

## 2013-03-05 DIAGNOSIS — E876 Hypokalemia: Secondary | ICD-10-CM

## 2013-03-05 DIAGNOSIS — Z23 Encounter for immunization: Secondary | ICD-10-CM

## 2013-03-05 DIAGNOSIS — R972 Elevated prostate specific antigen [PSA]: Secondary | ICD-10-CM | POA: Insufficient documentation

## 2013-03-05 DIAGNOSIS — Z0000 Encounter for general adult medical examination without abnormal findings: Secondary | ICD-10-CM

## 2013-03-05 LAB — BASIC METABOLIC PANEL
Chloride: 103 mEq/L (ref 96–112)
GFR: 76.42 mL/min (ref >60.00)
Potassium: 3.6 mEq/L (ref 3.5–5.1)
Sodium: 142 mEq/L (ref 135–145)

## 2013-03-05 LAB — PSA: PSA: 5.25 ng/mL — ABNORMAL HIGH (ref 0.10–4.00)

## 2013-03-05 NOTE — Patient Instructions (Signed)
You had the flu shot today Please return in 2 wks for a Nurse Visit for the new Prevnar pneumonia shot Please continue all other medications as before, and refills have been done if requested. Please have the pharmacy call with any other refills you may need. Please continue your efforts at being more active, low cholesterol diet, and weight control. You are otherwise up to date with prevention measures today. Please go to the LAB in the Basement (turn left off the elevator) for the tests to be done today You will be contacted by phone if any changes need to be made immediately.  Otherwise, you will receive a letter about your results with an explanation, but please check with MyChart first.  Please remember to sign up for My Chart if you have not done so, as this will be important to you in the future with finding out test results, communicating by private email, and scheduling acute appointments online when needed.  Please return in 6 months, or sooner if needed

## 2013-03-05 NOTE — Assessment & Plan Note (Signed)
Also for psa f/u

## 2013-03-05 NOTE — Progress Notes (Signed)
Pre-visit discussion using our clinic review tool. No additional management support is needed unless otherwise documented below in the visit note.  

## 2013-03-05 NOTE — Progress Notes (Signed)
Subjective:    Patient ID: Aaron Moss, male    DOB: 12/10/1945, 67 y.o.   MRN: [id number]  HPI Here for wellness and f/u;  Overall doing ok;  Pt denies CP, worsening SOB, DOE, wheezing, orthopnea, PND, worsening LE edema, palpitations, dizziness or syncope.  Pt denies neurological change such as new headache, facial or extremity weakness.  Pt denies polydipsia, polyuria, or low sugar symptoms. Pt states overall good compliance with treatment and medications, good tolerability, and has been trying to follow lower cholesterol diet.  Pt denies worsening depressive symptoms, suicidal ideation or panic. No fever, night sweats, wt loss, loss of appetite, or other constitutional symptoms.  Pt states good ability with ADL's, has low fall risk, home safety reviewed and adequate, no other significant changes in hearing or vision, and only occasionally active with exercise.  No acute compalints Past Medical History  Diagnosis Date  . Hypertension   . Hyperlipidemia   . History of colon cancer   . Left knee DJD   . Diverticulosis of colon    Past Surgical History  Procedure Laterality Date  . Colon surgery  2000    colon cancer  . Knee arthroscopy  2007    LT- [facility]  . Colonoscopy    . Eus      pancreatic cyst    reports that he quit smoking about 24 years ago. His smoking use included Cigarettes. He smoked 1.00 pack per day. He has never used smokeless tobacco. He reports that he drinks alcohol. He reports that he does not use illicit drugs. family history includes Cancer in his brother, maternal grandmother, and mother; Colon cancer in his sister; Diabetes in his brother. Allergies  Allergen Reactions  . Pregabalin     REACTION: felt bad   Current Outpatient Prescriptions on File Prior to Visit  Medication Sig Dispense Refill  . amLODipine (NORVASC) 10 MG tablet TAKE ONE TABLET BY MOUTH EVERY DAY  90 tablet  3  . aspirin EC 81 MG tablet Take 81 mg by mouth daily.        .  benazepril (LOTENSIN) 40 MG tablet TAKE ONE TABLET BY MOUTH ONCE DAILY  90 tablet  3  . diclofenac (VOLTAREN) 75 MG EC tablet TAKE ONE TABLET BY MOUTH TWICE DAILY AS NEEDED FOR PAIN  60 tablet  6  . hydrochlorothiazide (HYDRODIURIL) 25 MG tablet TAKE ONE TABLET BY MOUTH EVERY DAY  90 tablet  0  . potassium chloride (KLOR-CON 10) 10 MEQ tablet Take 1 tablet (10 mEq total) by mouth daily.  90 tablet  3  . pravastatin (PRAVACHOL) 40 MG tablet TAKE TWO TABLETS BY MOUTH ONCE DAILY (DUE FOR A  ROUTINE PHYSICAL WITH LABS)  60 tablet  11  . traMADol (ULTRAM) 50 MG tablet TAKE ONE TABLET BY MOUTH EVERY 6 HOURS AS NEEDED FOR PAIN  120 tablet  2  . vardenafil (LEVITRA) 20 MG tablet Take 1 tablet (20 mg total) by mouth as needed for erectile dysfunction.  5 tablet  11   No current facility-administered medications on file prior to visit.   Review of Systems Constitutional: Negative for diaphoresis, activity change, appetite change or unexpected weight change.  HENT: Negative for hearing loss, ear pain, facial swelling, mouth sores and neck stiffness.   Eyes: Negative for pain, redness and visual disturbance.  Respiratory: Negative for shortness of breath and wheezing.   Cardiovascular: Negative for chest pain and palpitations.  Gastrointestinal: Negative for diarrhea, blood in  stool, abdominal distention or other pain Genitourinary: Negative for hematuria, flank pain or change in urine volume.  Musculoskeletal: Negative for myalgias and joint swelling.  Skin: Negative for color change and wound.  Neurological: Negative for syncope and numbness. other than noted Hematological: Negative for adenopathy.  Psychiatric/Behavioral: Negative for hallucinations, self-injury, decreased concentration and agitation.      Objective:   Physical Exam BP 132/68  Pulse 72  Temp(Src) 97.8 F (36.6 C) (Oral)  Ht 5\' 6"  (1.676 m)  Wt 262 lb 12 oz (119.183 kg)  BMI 42.43 kg/m2  SpO2 94% VS noted,  Constitutional:  Pt is oriented to person, place, and time. Appears well-developed and well-nourished.  Head: Normocephalic and atraumatic.  Right Ear: External ear normal.  Left Ear: External ear normal.  Nose: Nose normal.  Mouth/Throat: Oropharynx is clear and moist.  Eyes: Conjunctivae and EOM are normal. Pupils are equal, round, and reactive to light.  Neck: Normal range of motion. Neck supple. No JVD present. No tracheal deviation present.  Cardiovascular: Normal rate, regular rhythm, normal heart sounds and intact distal pulses.   Pulmonary/Chest: Effort normal and breath sounds normal.  Abdominal: Soft. Bowel sounds are normal. There is no tenderness. No HSM  Musculoskeletal: Normal range of motion. Exhibits no edema.  Lymphadenopathy:  Has no cervical adenopathy.  Neurological: Pt is alert and oriented to person, place, and time. Pt has normal reflexes. No cranial nerve deficit.  Skin: Skin is warm and dry. No rash noted.  Psychiatric:  Has  normal mood and affect. Behavior is normal.     Assessment & Plan:

## 2013-03-05 NOTE — Assessment & Plan Note (Signed)

## 2013-03-05 NOTE — Assessment & Plan Note (Signed)
Now on K, for f/u lab

## 2013-03-06 ENCOUNTER — Other Ambulatory Visit: Payer: Self-pay | Admitting: Internal Medicine

## 2013-03-06 ENCOUNTER — Encounter: Payer: Self-pay | Admitting: Internal Medicine

## 2013-03-06 DIAGNOSIS — R972 Elevated prostate specific antigen [PSA]: Secondary | ICD-10-CM

## 2013-03-26 ENCOUNTER — Ambulatory Visit: Payer: Medicare Other | Admitting: *Deleted

## 2013-03-26 DIAGNOSIS — Z23 Encounter for immunization: Secondary | ICD-10-CM

## 2013-03-26 MED ORDER — PNEUMOCOCCAL 13-VAL CONJ VACC IM SUSP: 0.5000 mL | INTRAMUSCULAR | Status: DC

## 2013-04-22 ENCOUNTER — Other Ambulatory Visit: Payer: Self-pay | Admitting: Internal Medicine

## 2013-05-13 ENCOUNTER — Telehealth: Payer: Self-pay

## 2013-05-13 MED ORDER — ATORVASTATIN CALCIUM 40 MG PO TABS: 40.0000 mg | ORAL_TABLET | Freq: Every day | ORAL | Status: DC

## 2013-05-13 NOTE — Telephone Encounter (Signed)
Done erx to [facility] - generic lipitor 40 qd (should be as good as the pravachol 80)

## 2013-05-13 NOTE — Telephone Encounter (Signed)
The patient is unable to get his pravastatin refilled as letter from insurance states he is exceeding amount to take daily they will cover.  He does need alternative as is out at this time.

## 2013-05-13 NOTE — Telephone Encounter (Signed)
Called the patient informed of Medication change and pharmacy sent to.

## 2013-05-23 DIAGNOSIS — C61 Malignant neoplasm of prostate: Secondary | ICD-10-CM

## 2013-05-23 HISTORY — DX: Malignant neoplasm of prostate: C61

## 2013-05-23 HISTORY — PX: PROSTATE BIOPSY: SHX241

## 2013-06-11 ENCOUNTER — Other Ambulatory Visit: Payer: Self-pay | Admitting: Internal Medicine

## 2013-06-11 NOTE — Telephone Encounter (Signed)
Faxed script bck to [facility]...lmb 

## 2013-07-01 ENCOUNTER — Encounter: Payer: Self-pay | Admitting: Radiation Oncology

## 2013-07-01 NOTE — Progress Notes (Signed)
GU Location of Tumor / Histology: prostate  If Prostate Cancer, Gleason Score is (3 + 4), volume 11.5 mL and PSA is (5.25 on 01/2013) 07/2011 PSA 3.08 2012  PSA 1.86 2011  PSA 2.19  Patient presented 2 months ago with signs/symptoms of: PSA elevation  Biopsies of prostate (if applicable) revealed:  08/19/84   Past/Anticipated interventions by urology, if any: biopsy  Past/Anticipated interventions by medical oncology, if any: none  Weight changes, if any: no  Bowel/Bladder complaints, if any:  Urinary frequency, nocturia x 3,   IPSS 6  Nausea/Vomiting, if any: no  Pain issues, if any:  Occasional arthritis pain  SAFETY ISSUES:  Prior radiation? no  Pacemaker/ICD? no  Possible current pregnancy? na  Is the patient on methotrexate? no  Current Complaints / other details: married, retired Curator for [employer], 4 children, several grandchildren and great grandchildren.  History of colon cancer in 2000, tx w/surgery, chemotherapy. Dr Diona Fanti: Pt is excellent candidate for brachytherapy.

## 2013-07-02 ENCOUNTER — Encounter: Payer: Self-pay | Admitting: Radiation Oncology

## 2013-07-02 ENCOUNTER — Ambulatory Visit
Admission: RE | Admit: 2013-07-02 | Discharge: 2013-07-02 | Disposition: A | Discharge status: Home / Self Care | Payer: Medicare Other | Source: Ambulatory Visit | Attending: Radiation Oncology | Admitting: Radiation Oncology

## 2013-07-02 VITALS — BP 148/62 | HR 69 | Temp 98.8°F | Resp 20 | Wt 269.0 lb

## 2013-07-02 DIAGNOSIS — E785 Hyperlipidemia, unspecified: Secondary | ICD-10-CM | POA: Diagnosis not present

## 2013-07-02 DIAGNOSIS — Z7982 Long term (current) use of aspirin: Secondary | ICD-10-CM | POA: Insufficient documentation

## 2013-07-02 DIAGNOSIS — Z9049 Acquired absence of other specified parts of digestive tract: Secondary | ICD-10-CM | POA: Diagnosis not present

## 2013-07-02 DIAGNOSIS — I10 Essential (primary) hypertension: Secondary | ICD-10-CM | POA: Diagnosis not present

## 2013-07-02 DIAGNOSIS — R300 Dysuria: Secondary | ICD-10-CM | POA: Insufficient documentation

## 2013-07-02 DIAGNOSIS — Z85038 Personal history of other malignant neoplasm of large intestine: Secondary | ICD-10-CM | POA: Diagnosis not present

## 2013-07-02 DIAGNOSIS — R351 Nocturia: Secondary | ICD-10-CM | POA: Diagnosis not present

## 2013-07-02 DIAGNOSIS — N529 Male erectile dysfunction, unspecified: Secondary | ICD-10-CM | POA: Insufficient documentation

## 2013-07-02 DIAGNOSIS — Z79899 Other long term (current) drug therapy: Secondary | ICD-10-CM | POA: Diagnosis not present

## 2013-07-02 DIAGNOSIS — Z510 Encounter for antineoplastic radiation therapy: Secondary | ICD-10-CM | POA: Diagnosis present

## 2013-07-02 DIAGNOSIS — N390 Urinary tract infection, site not specified: Secondary | ICD-10-CM | POA: Diagnosis not present

## 2013-07-02 DIAGNOSIS — Z87891 Personal history of nicotine dependence: Secondary | ICD-10-CM | POA: Diagnosis not present

## 2013-07-02 DIAGNOSIS — Z9221 Personal history of antineoplastic chemotherapy: Secondary | ICD-10-CM | POA: Diagnosis not present

## 2013-07-02 DIAGNOSIS — C61 Malignant neoplasm of prostate: Secondary | ICD-10-CM | POA: Insufficient documentation

## 2013-07-02 HISTORY — DX: Personal history of antineoplastic chemotherapy: Z92.21

## 2013-07-02 HISTORY — DX: Malignant neoplasm of prostate: C61

## 2013-07-02 HISTORY — DX: Malignant neoplasm of colon, unspecified: C18.9

## 2013-07-02 NOTE — Progress Notes (Signed)
Please see the Nurse Progress Note in the MD Initial Consult Encounter for this patient. 

## 2013-07-02 NOTE — Progress Notes (Signed)
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 male who is seen today for the courtesy of Dr. Diona Fanti for discussion of possible radiation therapy in the management of his stage T1c  intermediate risk adenocarcinoma prostate. His PSAs were in the 1-2 range from 2009 through 2012. His PSA in 2013 was 2.58, rising to 3.08 by May 2013 and then 5.25 in November 2014. He underwent ultrasound-guided biopsies by Dr. Diona Fanti on May 23 2013. His then have Gleason 7 (3+4) involving 30% of one core from the left lateral base along with Gleason 6 (3+3) involving 40% of one core from right lateral base, 10% of one core from the right base, and 2 small foci from the left lateral apex. Atypia was seen in 2 of the biopsies along with PIN/atypia. His gland volume was only 11.5 cc. He is doing well from a GU and GI standpoint. His I PSS score is 6. He does have erectile dysfunction. Of note is that he does have a history of colon cancer for which he underwent a partial colectomy along with with adjuvant chemotherapy through Dr. Burney Gauze in 2000.  PREVIOUS RADIATION THERAPY: No   PAST MEDICAL HISTORY:  has a past medical history of Hypertension; Hyperlipidemia; History of colon cancer; Left knee DJD; Diverticulosis of colon; Prostate cancer (05/23/13); Colon cancer (2000); and History of chemotherapy (2000).     PAST SURGICAL HISTORY:  Past Surgical History  Procedure Laterality Date  . Colon surgery  2000    colon cancer  . Knee arthroscopy  2007    LT- [facility]  . Colonoscopy    . Eus      pancreatic cyst  . Prostate biopsy  05/23/13    gleason 7, volume  11.5 ml     FAMILY HISTORY: family history includes Cancer in his brother, maternal grandmother, and mother; Colon cancer in his sister; Diabetes in his brother. His father died from complications of alcoholic cirrhosis and 70. His mother died from metastatic cancer at to the liver (question colon cancer). No family history of prostate cancer.   SOCIAL HISTORY:  reports that he quit smoking about 25 years ago. His smoking use included Cigarettes. He has a 22 pack-year smoking history. He has never used smokeless tobacco. He reports that he drinks alcohol. He reports that he does not use illicit drugs. Married, 4 children. Retired Curator for [employer].   ALLERGIES: Pregabalin   MEDICATIONS:  Current Outpatient Prescriptions  Medication Sig Dispense Refill  . amLODipine (NORVASC) 10 MG tablet TAKE ONE TABLET BY MOUTH EVERY DAY  90 tablet  3  . aspirin EC 81 MG tablet Take 81 mg by mouth daily.        Marland Kitchen atorvastatin (LIPITOR) 40 MG tablet Take 1 tablet (40 mg total) by mouth daily.  90 tablet  3  . benazepril (LOTENSIN) 40 MG tablet TAKE ONE TABLET BY MOUTH ONCE DAILY  90 tablet  3  . diclofenac (VOLTAREN) 75 MG EC tablet TAKE ONE TABLET BY MOUTH TWICE DAILY AS NEEDED FOR PAIN  60 tablet  6  . hydrochlorothiazide (HYDRODIURIL) 25 MG tablet TAKE ONE TABLET BY MOUTH EVERY DAY  90 tablet  0  . potassium chloride (KLOR-CON 10) 10 MEQ tablet Take 1 tablet (10 mEq total) by mouth daily.  90 tablet  3  . traMADol (ULTRAM) 50 MG tablet TAKE ONE TABLET BY MOUTH EVERY 6 HOURS AS NEEDED FOR PAIN  120 tablet  0  . vardenafil (LEVITRA) 20 MG tablet Take 1 tablet (20 mg total) by mouth as needed for erectile dysfunction.  5 tablet  11   No current facility-administered medications for this encounter.     REVIEW OF SYSTEMS:  Pertinent items are noted in HPI.    PHYSICAL EXAM:  weight is 269 lb (122.018 kg). His oral temperature is 98.8 F (37.1 C). His blood pressure is 148/62 and his  pulse is 69. His respiration is 20.   Alert and oriented 68 year old African American male appearing his stated age. Head and neck examination: Grossly unremarkable. Nodes: Without palpable cervical or supraclavicular lymphadenopathy. Chest: Lungs clear. Back: Without spinal or CVA tenderness. Abdomen: Lower abdominal scar from his previous colectomy. Without hepatomegaly. Genitalia: Unremarkable to inspection. Rectal: The prostate gland is small, and I cannot palpate the base of his gland. Extremities: Without edema.   LABORATORY DATA:  Lab Results  Component Value Date   WBC 8.8 09/04/2012   HGB 15.1 09/04/2012   HCT 45.0 09/04/2012   MCV 81.7 09/04/2012   PLT 220.0 09/04/2012   Lab Results  Component Value Date   NA 142 03/05/2013   K 3.6 03/05/2013   CL 103 03/05/2013   CO2 33* 03/05/2013   Lab Results  Component Value Date   ALT 17 09/04/2012   AST 19 09/04/2012   ALKPHOS 93 09/04/2012   BILITOT 1.2 09/04/2012   PSA 5.25   IMPRESSION: Stage T1c intermediate risk adenocarcinoma prostate. I explained to the patient and his wife that his prognosis is related to his stage, Gleason score, and PSA level. His stage and PSA level are favorable while his Gleason score of 7 is of intermediate favorability. Management options include surgery versus close surveillance/observation versus radiation therapy. In view of his small gland volume, he is not a candidate for seed implantation from a technical standpoint. Ideally, his gland volume should be at least 15-20 cc to obtain satisfactory dosimetry. His other radiation therapy options 8 weeks of external beam/IMRT. We discussed the potential acute and late toxicities of radiation therapy. We talked about treatment with a comfortably full bladder to minimize urinary  toxicity. His prognosis is obviously favorable. He will speak with Dr. Diona Fanti, now recognizing that he is not an ideal candidate for seed implantation. Robotic surgery maybe difficult  secondary to his previous colon surgery. He would be a good candidate for external beam/IMRT.   PLAN: He will speak with Dr. Diona Fanti about a possible prostatectomy since he is not an ideal candidate for seed implantation. The patient can contact me if he would like to consider external beam/IMRT. I would need to have Dr. Diona Fanti place 3 gold seed markers if he wants to proceed with radiation therapy.  I spent 60 minutes minutes face to face with the patient and more than 50% of that time was spent in counseling and/or coordination of care.

## 2013-07-08 ENCOUNTER — Encounter: Payer: Self-pay | Admitting: Radiation Oncology

## 2013-07-08 NOTE — Progress Notes (Signed)
CC: Dr. Franchot Gallo  The patient called me today and he wants to proceed with external beam/IMRT. We discussed earlier that his gland was really too small for seed implantation. I will need to have.Dr. Diona Fanti place 3 gold seed markers into his prostate for image guidance, and then we will get him set up for CT simulation.

## 2013-07-14 ENCOUNTER — Telehealth: Payer: Self-pay | Admitting: *Deleted

## 2013-07-14 NOTE — Telephone Encounter (Signed)
CALLED PATIENT TO INFORM OF GOLD SEED PLACEMENT BEING ON 08-13-13, I HAVE ASKED FOR AN EARLIER DAY  FROM DR. DAHLSTEDT'S NURSE, I AM WAITING FOR A RETURN CALL WITH A NEW APPT. DATE AND TIME, I INFORMED THE PATIENT AS SOON AS I KNOW A NEW DATE AND TIME THAT I WILL GIVE HIM A CALL WITH THAT INFO.

## 2013-07-16 ENCOUNTER — Telehealth: Payer: Self-pay | Admitting: *Deleted

## 2013-07-16 NOTE — Telephone Encounter (Signed)
CALLED PATIENT TO INFORM OF GOLD SEED PLACEMENT ON 07-25-13 @ 2:00 PM @ DR. DAHLSTEDT'S OFFICE AND HIS SIM ON 07-28-13 @ 9:00 AM @ DR. MURRAY'S OFFICE, LVM FOR A RETURN CALL

## 2013-07-18 ENCOUNTER — Other Ambulatory Visit: Payer: Self-pay | Admitting: Internal Medicine

## 2013-07-18 NOTE — Telephone Encounter (Signed)
Done hardcopy to [facility]  

## 2013-07-18 NOTE — Telephone Encounter (Signed)
Faxed to pharmacy

## 2013-07-28 ENCOUNTER — Ambulatory Visit
Admission: RE | Admit: 2013-07-28 | Discharge: 2013-07-28 | Disposition: A | Discharge status: Home / Self Care | Payer: Medicare Other | Source: Ambulatory Visit | Attending: Radiation Oncology | Admitting: Radiation Oncology

## 2013-07-28 DIAGNOSIS — C61 Malignant neoplasm of prostate: Secondary | ICD-10-CM

## 2013-07-28 DIAGNOSIS — Z510 Encounter for antineoplastic radiation therapy: Secondary | ICD-10-CM | POA: Diagnosis not present

## 2013-07-28 NOTE — Progress Notes (Signed)
Complex simulation/treatment planning note: The patient was taken to the CT simulator. He was placed supine. A VAC LOC immobilization device was constructed. A rubber tube was placed within the rectal vault. He was in catheterized and contrast instilled into the bladder/urethra. He was then scanned. I placed an isocenter in the middle of the prostate. The CT data set was sent to the MIM planning system were contoured his prostate, seminal vesicles, bladder, rectum, and inferior rectosigmoid colon. I am prescribing 7800 cGy in 40 sessions to his prostate PTV which represents the prostate was 0.8 cm except for 0.5 cm along the rectum. I am prescribing 5600 cGy in 40 sessions to his seminal vesicle PTV which  presents the seminal vesicles plus 0.5 cm. He will be treated with a comfortably full bladder. I requesting daily cone beam CT, setting up to his 3 gold seeds.

## 2013-08-04 ENCOUNTER — Encounter: Payer: Self-pay | Admitting: Radiation Oncology

## 2013-08-04 DIAGNOSIS — Z510 Encounter for antineoplastic radiation therapy: Secondary | ICD-10-CM | POA: Diagnosis not present

## 2013-08-04 NOTE — Progress Notes (Signed)
IMRT simulation/treatment planning note: The patient completed IMRT simulation/treatment planning in the management of his carcinoma of the prostate. IMRT was chosen to decrease the risk for both acute and late bladder and rectal toxicity compared to 3-D conformal or conventional radiation therapy. Dose volume histograms were obtained for the target structures including the prostate PTV, and seminal vesicle PTV in addition to avoidance structures including the bladder, rectum, and femoral heads. We met our departmental goals. I'm prescribing 7800 cGy to his prostate PTV and 5006 her centigray to his seminal vesicle PTV, both in 40 sessions. He is be treated with a company full bladder. I am requesting daily cone beam CT, setting up to his 3 gold seeds.

## 2013-08-06 ENCOUNTER — Ambulatory Visit
Admission: RE | Admit: 2013-08-06 | Discharge: 2013-08-06 | Disposition: A | Discharge status: Home / Self Care | Payer: Medicare Other | Source: Ambulatory Visit | Attending: Radiation Oncology | Admitting: Radiation Oncology

## 2013-08-06 DIAGNOSIS — C61 Malignant neoplasm of prostate: Secondary | ICD-10-CM

## 2013-08-06 DIAGNOSIS — Z510 Encounter for antineoplastic radiation therapy: Secondary | ICD-10-CM | POA: Diagnosis not present

## 2013-08-06 NOTE — Progress Notes (Signed)
Chart note:  The patient began his IMRT today in the management of his carcinoma the prostate. He is being treated with 2 VMAT arcs with dynamic MLCs corresponding to one set of IMRT treatment devices [phone]).

## 2013-08-07 ENCOUNTER — Ambulatory Visit
Admission: RE | Admit: 2013-08-07 | Discharge: 2013-08-07 | Disposition: A | Discharge status: Home / Self Care | Payer: Medicare Other | Source: Ambulatory Visit | Attending: Radiation Oncology | Admitting: Radiation Oncology

## 2013-08-07 DIAGNOSIS — Z510 Encounter for antineoplastic radiation therapy: Secondary | ICD-10-CM | POA: Diagnosis not present

## 2013-08-08 ENCOUNTER — Ambulatory Visit
Admission: RE | Admit: 2013-08-08 | Discharge: 2013-08-08 | Disposition: A | Discharge status: Home / Self Care | Payer: Medicare Other | Source: Ambulatory Visit | Attending: Radiation Oncology | Admitting: Radiation Oncology

## 2013-08-08 DIAGNOSIS — Z510 Encounter for antineoplastic radiation therapy: Secondary | ICD-10-CM | POA: Diagnosis not present

## 2013-08-08 NOTE — Progress Notes (Signed)
Reviewed routine of clinic with patient and wife.given Radiation Therapy and You Booklet.Teachback method utilized Understands that he will see the doctor every Tuesday after radiation treatment.the main possible side effects may be urinary frequency/urgency, burning on urination, increase in stools, diarrhea, rectal irritation and fatigue.reiterated need to have full bladder and that he may have to adjust when to take diuretic by waiting until after treatment.

## 2013-08-12 ENCOUNTER — Ambulatory Visit
Admission: RE | Admit: 2013-08-12 | Discharge: 2013-08-12 | Disposition: A | Discharge status: Home / Self Care | Payer: Medicare Other | Source: Ambulatory Visit | Attending: Radiation Oncology | Admitting: Radiation Oncology

## 2013-08-12 ENCOUNTER — Encounter: Payer: Self-pay | Admitting: Radiation Oncology

## 2013-08-12 VITALS — BP 150/75 | HR 76 | Temp 98.6°F | Resp 20 | Wt 267.7 lb

## 2013-08-12 DIAGNOSIS — C61 Malignant neoplasm of prostate: Secondary | ICD-10-CM

## 2013-08-12 DIAGNOSIS — Z510 Encounter for antineoplastic radiation therapy: Secondary | ICD-10-CM | POA: Diagnosis not present

## 2013-08-12 NOTE — Progress Notes (Signed)
Weekly Management Note:  Site: Prostate Current Dose:  780  cGy Projected Dose: 7800  cGy  Narrative: The patient is seen today for routine under treatment assessment. CBCT/MVCT images/port films were reviewed. The chart was reviewed.   Bladder filling is satisfactory. No new GU or GI difficulties.  Physical Examination:  Filed Vitals:   08/12/13 1054  BP: 150/75  Pulse: 76  Temp: 98.6 F (37 C)  Resp: 20  .  Weight: 267 lb 11.2 oz (121.428 kg). No change.  Impression: Tolerating radiation therapy well.  Plan: Continue radiation therapy as planned.

## 2013-08-12 NOTE — Progress Notes (Signed)
Pt denies pain, urinary/bowel issues, fatigue, loss of appetite. 

## 2013-08-13 ENCOUNTER — Ambulatory Visit
Admission: RE | Admit: 2013-08-13 | Discharge: 2013-08-13 | Disposition: A | Discharge status: Home / Self Care | Payer: Medicare Other | Source: Ambulatory Visit | Attending: Radiation Oncology | Admitting: Radiation Oncology

## 2013-08-13 DIAGNOSIS — Z510 Encounter for antineoplastic radiation therapy: Secondary | ICD-10-CM | POA: Diagnosis not present

## 2013-08-14 ENCOUNTER — Ambulatory Visit
Admission: RE | Admit: 2013-08-14 | Discharge: 2013-08-14 | Disposition: A | Discharge status: Home / Self Care | Payer: Medicare Other | Source: Ambulatory Visit | Attending: Radiation Oncology | Admitting: Radiation Oncology

## 2013-08-14 DIAGNOSIS — Z510 Encounter for antineoplastic radiation therapy: Secondary | ICD-10-CM | POA: Diagnosis not present

## 2013-08-15 ENCOUNTER — Ambulatory Visit
Admission: RE | Admit: 2013-08-15 | Discharge: 2013-08-15 | Disposition: A | Discharge status: Home / Self Care | Payer: Medicare Other | Source: Ambulatory Visit | Attending: Radiation Oncology | Admitting: Radiation Oncology

## 2013-08-15 DIAGNOSIS — Z510 Encounter for antineoplastic radiation therapy: Secondary | ICD-10-CM | POA: Diagnosis not present

## 2013-08-18 ENCOUNTER — Emergency Department (HOSPITAL_COMMUNITY)
Admission: EM | Admit: 2013-08-18 | Discharge: 2013-08-18 | Disposition: A | Discharge status: Home / Self Care | Payer: Medicare Other | Attending: Emergency Medicine | Admitting: Emergency Medicine

## 2013-08-18 ENCOUNTER — Ambulatory Visit
Admission: RE | Admit: 2013-08-18 | Discharge: 2013-08-18 | Disposition: A | Discharge status: Home / Self Care | Payer: Medicare Other | Source: Ambulatory Visit | Attending: Radiation Oncology | Admitting: Radiation Oncology

## 2013-08-18 ENCOUNTER — Encounter: Payer: Self-pay | Admitting: Radiation Oncology

## 2013-08-18 ENCOUNTER — Emergency Department (HOSPITAL_COMMUNITY): Payer: Medicare Other

## 2013-08-18 ENCOUNTER — Encounter (HOSPITAL_COMMUNITY): Payer: Self-pay | Admitting: Emergency Medicine

## 2013-08-18 VITALS — BP 141/64 | HR 86 | Temp 98.3°F | Resp 20 | Wt 260.3 lb

## 2013-08-18 DIAGNOSIS — Z510 Encounter for antineoplastic radiation therapy: Secondary | ICD-10-CM | POA: Diagnosis not present

## 2013-08-18 DIAGNOSIS — R509 Fever, unspecified: Secondary | ICD-10-CM

## 2013-08-18 DIAGNOSIS — M1710 Unilateral primary osteoarthritis, unspecified knee: Secondary | ICD-10-CM | POA: Insufficient documentation

## 2013-08-18 DIAGNOSIS — Z7982 Long term (current) use of aspirin: Secondary | ICD-10-CM | POA: Insufficient documentation

## 2013-08-18 DIAGNOSIS — Z79899 Other long term (current) drug therapy: Secondary | ICD-10-CM | POA: Insufficient documentation

## 2013-08-18 DIAGNOSIS — Z8719 Personal history of other diseases of the digestive system: Secondary | ICD-10-CM | POA: Insufficient documentation

## 2013-08-18 DIAGNOSIS — C61 Malignant neoplasm of prostate: Secondary | ICD-10-CM

## 2013-08-18 DIAGNOSIS — Z8546 Personal history of malignant neoplasm of prostate: Secondary | ICD-10-CM | POA: Insufficient documentation

## 2013-08-18 DIAGNOSIS — E785 Hyperlipidemia, unspecified: Secondary | ICD-10-CM | POA: Insufficient documentation

## 2013-08-18 DIAGNOSIS — IMO0002 Reserved for concepts with insufficient information to code with codable children: Secondary | ICD-10-CM | POA: Insufficient documentation

## 2013-08-18 DIAGNOSIS — Z9221 Personal history of antineoplastic chemotherapy: Secondary | ICD-10-CM | POA: Insufficient documentation

## 2013-08-18 DIAGNOSIS — Z85038 Personal history of other malignant neoplasm of large intestine: Secondary | ICD-10-CM | POA: Insufficient documentation

## 2013-08-18 DIAGNOSIS — Z87891 Personal history of nicotine dependence: Secondary | ICD-10-CM | POA: Insufficient documentation

## 2013-08-18 DIAGNOSIS — I10 Essential (primary) hypertension: Secondary | ICD-10-CM | POA: Insufficient documentation

## 2013-08-18 LAB — URINALYSIS, ROUTINE W REFLEX MICROSCOPIC
Bilirubin Urine: NEGATIVE
GLUCOSE, UA: NEGATIVE mg/dL
Ketones, ur: NEGATIVE mg/dL
NITRITE: NEGATIVE
PROTEIN: NEGATIVE mg/dL
Specific Gravity, Urine: 1.014 (ref 1.005–1.030)
Urobilinogen, UA: 1 mg/dL (ref 0.0–1.0)
pH: 6 (ref 5.0–8.0)

## 2013-08-18 LAB — CBC WITH DIFFERENTIAL/PLATELET
BASOS ABS: 0 10*3/uL (ref 0.0–0.1)
Basophils Relative: 0 % (ref 0–1)
EOS PCT: 1 % (ref 0–5)
Eosinophils Absolute: 0.2 10*3/uL (ref 0.0–0.7)
HEMATOCRIT: 43.5 % (ref 39.0–52.0)
Hemoglobin: 14.7 g/dL (ref 13.0–17.0)
LYMPHS ABS: 0.7 10*3/uL (ref 0.7–4.0)
LYMPHS PCT: 6 % — AB (ref 12–46)
MCH: 27.4 pg (ref 26.0–34.0)
MCHC: 33.8 g/dL (ref 30.0–36.0)
MCV: 81.2 fL (ref 78.0–100.0)
MONO ABS: 0.6 10*3/uL (ref 0.1–1.0)
MONOS PCT: 5 % (ref 3–12)
NEUTROS ABS: 10.6 10*3/uL — AB (ref 1.7–7.7)
Neutrophils Relative %: 88 % — ABNORMAL HIGH (ref 43–77)
Platelets: 170 10*3/uL (ref 150–400)
RBC: 5.36 MIL/uL (ref 4.22–5.81)
RDW: 15.5 % (ref 11.5–15.5)
WBC: 12 10*3/uL — ABNORMAL HIGH (ref 4.0–10.5)

## 2013-08-18 LAB — COMPREHENSIVE METABOLIC PANEL
ALBUMIN: 3.7 g/dL (ref 3.5–5.2)
ALT: 33 U/L (ref 0–53)
AST: 37 U/L (ref 0–37)
Alkaline Phosphatase: 121 U/L — ABNORMAL HIGH (ref 39–117)
BUN: 20 mg/dL (ref 6–23)
CO2: 25 mEq/L (ref 19–32)
CREATININE: 1.1 mg/dL (ref 0.50–1.35)
Calcium: 9.3 mg/dL (ref 8.4–10.5)
Chloride: 103 mEq/L (ref 96–112)
GFR calc non Af Amer: 67 mL/min — ABNORMAL LOW (ref >90)
GFR, EST AFRICAN AMERICAN: 77 mL/min — AB (ref >90)
GLUCOSE: 125 mg/dL — AB (ref 70–99)
Potassium: 3.5 mEq/L — ABNORMAL LOW (ref 3.7–5.3)
Sodium: 142 mEq/L (ref 137–147)
TOTAL PROTEIN: 7.8 g/dL (ref 6.0–8.3)
Total Bilirubin: 1.3 mg/dL — ABNORMAL HIGH (ref 0.3–1.2)

## 2013-08-18 LAB — I-STAT CG4 LACTIC ACID, ED: LACTIC ACID, VENOUS: 1.22 mmol/L (ref 0.5–2.2)

## 2013-08-18 LAB — URINE MICROSCOPIC-ADD ON

## 2013-08-18 MED ORDER — ACETAMINOPHEN 325 MG PO TABS
650.0000 mg | ORAL_TABLET | Freq: Once | ORAL | Status: AC
  Administered 2013-08-18: 650 mg via ORAL
  Filled 2013-08-18: qty 2

## 2013-08-18 MED ORDER — SODIUM CHLORIDE 0.9 % IV BOLUS (SEPSIS)
1000.0000 mL | Freq: Once | INTRAVENOUS | Status: AC
  Administered 2013-08-18: 1000 mL via INTRAVENOUS

## 2013-08-18 NOTE — ED Provider Notes (Signed)
CSN: [id number]     Arrival date & time 08/18/13  0131 History   First MD Initiated Contact with Patient 08/18/13 0245     Chief Complaint  Patient presents with  . Fever     (Consider location/radiation/quality/duration/timing/severity/associated sxs/prior Treatment) HPI Comments: PT comes in with cc of fevers. Pt has current prostate CA for which he is getting radiation, remote hx of colon CA, s/p chemo several years ago, and HTN, HL. Pt states taht last night, he suddenly started feeling like he had a fever. He denies headache, neck pain, sore throat, chest pain, cough, abd pain, nausea, diarrhea, rash, joint pain.   Patient is a 68 y.o. male presenting with fever. The history is provided by the patient.  Fever Associated symptoms: no chest pain, no cough, no dysuria, no headaches and no rash     Past Medical History  Diagnosis Date  . Hypertension   . Hyperlipidemia   . History of colon cancer   . Left knee DJD   . Diverticulosis of colon   . 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 Colon cancer 2000  . History of chemotherapy 2000    colon cancer   Past Surgical History  Procedure Laterality Date  . Colon surgery  2000    colon cancer  . Knee arthroscopy  2007    LT- [facility]  . Colonoscopy    . Eus      pancreatic cyst  . Prostate biopsy  05/23/13    gleason 7, volume 11.5 ml   Family History  Problem Relation Age of Onset  . Cancer Mother     Liver  . Cancer Brother     Lung  . Cancer Maternal Grandmother     Breast  . Diabetes Brother   . Colon cancer Sister    History  Substance Use Topics  . Smoking status: Former Smoker -- 1.00 packs/day for 22 years    Types: Cigarettes    Quit date: 03/20/1988  . Smokeless tobacco: Never Used     Comment: 3 Children  . Alcohol Use: Yes     Comment: rare    Review of Systems  Constitutional: Positive for fever. Negative for activity change and appetite change.  Eyes: Negative for redness.   Respiratory: Negative for cough and shortness of breath.   Cardiovascular: Negative for chest pain.  Gastrointestinal: Negative for abdominal pain.  Genitourinary: Negative for dysuria.  Skin: Negative for rash.  Neurological: Negative for seizures and headaches.  All other systems reviewed and are negative.     Allergies  Pregabalin  Home Medications   Prior to Admission medications   Medication Sig Start Date End Date Taking? Authorizing Provider  amLODipine (NORVASC) 10 MG tablet Take 10 mg by mouth daily.   Yes Historical Provider, MD  aspirin EC 81 MG tablet Take 81 mg by mouth daily.     Yes Historical Provider, MD  atorvastatin (LIPITOR) 40 MG tablet Take 1 tablet (40 mg total) by mouth daily. 05/13/13  Yes Biagio Borg, MD  benazepril (LOTENSIN) 40 MG tablet Take 40 mg by mouth daily.   Yes Historical Provider, MD  diclofenac (VOLTAREN) 75 MG EC tablet Take 75 mg by mouth daily.   Yes Historical Provider, MD  hydrochlorothiazide (HYDRODIURIL) 25 MG tablet Take 25 mg by mouth daily.   Yes Historical Provider, MD  potassium chloride (KLOR-CON 10) 10 MEQ tablet Take 1 tablet (10 mEq total)  by mouth daily. 09/04/12  Yes Biagio Borg, MD  traMADol (ULTRAM) 50 MG tablet Take 50 mg by mouth every 6 (six) hours as needed for moderate pain.   Yes Historical Provider, MD  vardenafil (LEVITRA) 20 MG tablet Take 1 tablet (20 mg total) by mouth as needed for erectile dysfunction. 08/29/11 09/28/11  Biagio Borg, MD   BP 120/56  Pulse 83  Temp(Src) 99.6 F (37.6 C) (Oral)  Resp 16  Ht 5\' 6"  (1.676 m)  Wt 266 lb (120.657 kg)  BMI 42.95 kg/m2  SpO2 98% Physical Exam  Nursing note and vitals reviewed. Constitutional: He is oriented to person, place, and time. He appears well-developed.  HENT:  Head: Normocephalic and atraumatic.  Eyes: Conjunctivae and EOM are normal. Pupils are equal, round, and reactive to light.  Neck: Normal range of motion. Neck supple.  Cardiovascular: Normal  rate and regular rhythm.   Pulmonary/Chest: Effort normal and breath sounds normal.  Abdominal: Soft. Bowel sounds are normal. He exhibits no distension. There is no tenderness. There is no rebound and no guarding.  Neurological: He is alert and oriented to person, place, and time.  Skin: Skin is warm.    ED Course  Procedures (including critical care time) Labs Review Labs Reviewed  COMPREHENSIVE METABOLIC PANEL - Abnormal; Notable for the following:    Potassium 3.5 (*)    Glucose, Bld 125 (*)    Alkaline Phosphatase 121 (*)    Total Bilirubin 1.3 (*)    GFR calc non Af Amer 67 (*)    GFR calc Af Amer 77 (*)    All other components within normal limits  URINALYSIS, ROUTINE W REFLEX MICROSCOPIC - Abnormal; Notable for the following:    APPearance HAZY (*)    Hgb urine dipstick TRACE (*)    Leukocytes, UA MODERATE (*)    All other components within normal limits  CBC WITH DIFFERENTIAL - Abnormal; Notable for the following:    WBC 12.0 (*)    Neutrophils Relative % 88 (*)    Neutro Abs 10.6 (*)    Lymphocytes Relative 6 (*)    All other components within normal limits  URINE CULTURE  CULTURE, BLOOD (ROUTINE X 2)  CULTURE, BLOOD (ROUTINE X 2)  URINE MICROSCOPIC-ADD ON  CBC WITH DIFFERENTIAL  I-STAT CG4 LACTIC ACID, ED    Imaging Review Dg Chest Port 1 View  08/18/2013   CLINICAL DATA:  Fever and cough  EXAM: PORTABLE CHEST - 1 VIEW  COMPARISON:  None available  FINDINGS: The cardiac and mediastinal silhouettes are within normal limits.  The lungs are normally inflated. Diffuse bronchitic changes present. No airspace consolidation, pleural effusion, or pulmonary edema is identified. There is no pneumothorax.  No acute osseous abnormality identified.  IMPRESSION: Mild diffuse bronchitic changes, suggestive of bronchiolitis. No consolidative airspace disease identified.   Electronically Signed   By: Jeannine Boga M.D.   On: 08/18/2013 04:30     EKG Interpretation None       MDM   Final diagnoses:  Fever  SIRS syndrome  Pt comes in with cc of fever. Pt has hx of HRN, HL, prostate CA getting radiation. He has tachycardia, fevers - no other SIRS criteria. WC is 12. Head to Toe ROS is neg for any source of infection. Exam is negative for any infection as well. Lactate is negative.  Pt is not immunocompromised. Will get blood cultures.  Return precautions have been discussed.    Victory Strollo,  MD 08/18/13 1791

## 2013-08-18 NOTE — Progress Notes (Signed)
Weekly Management Note:  Site: Prostate Current Dose:  1560  cGy Projected Dose: 7800  cGy  Narrative: The patient is seen today for routine under treatment assessment. CBCT/MVCT images/port films were reviewed. The chart was reviewed.   Bladder filling is satisfactory. He had a fever last night of 101.0 and visited the emergency room. He is afebrile today. He is not on antibiotics. No GU or GI difficulties except for some gas after eating ice cream yesterday.  Physical Examination:  Filed Vitals:   08/18/13 1041  BP: 141/64  Pulse: 86  Temp: 98.3 F (36.8 C)  Resp: 20  .  Weight: 260 lb 4.8 oz (118.071 kg). No change.  Impression: Tolerating radiation therapy well.  Plan: Continue radiation therapy as planned.

## 2013-08-18 NOTE — Discharge Instructions (Signed)
We saw you in the ER for the fever. The lab results and imaging results don't indicate any specific source of infection.  We are not sure what is causing your symptoms. The workup in the ER is not complete, and is limited to screening for life threatening and emergent conditions only, so please see a primary care doctor for further evaluation.  Please return to the ER if your symptoms worsen; you have increased pain, fevers, chills, inability to keep any medications down, confusion. Otherwise see the outpatient doctor as requested.   Fever, Adult A fever is a higher than normal body temperature. In an adult, an oral temperature around 98.6 F (37 C) is considered normal. A temperature of 100.4 F (38 C) or higher is generally considered a fever. Mild or moderate fevers generally have no long-term effects and often do not require treatment. Extreme fever (greater than or equal to 106 F or 41.1 C) can cause seizures. The sweating that may occur with repeated or prolonged fever may cause dehydration. Elderly people can develop confusion during a fever. A measured temperature can vary with:  Age.  Time of day.  Method of measurement (mouth, underarm, rectal, or ear). The fever is confirmed by taking a temperature with a thermometer. Temperatures can be taken different ways. Some methods are accurate and some are not.  An oral temperature is used most commonly. Electronic thermometers are fast and accurate.  An ear temperature will only be accurate if the thermometer is positioned as recommended by the manufacturer.  A rectal temperature is accurate and done for those adults who have a condition where an oral temperature cannot be taken.  An underarm (axillary) temperature is not accurate and not recommended. Fever is a symptom, not a disease.  CAUSES   Infections commonly cause fever.  Some noninfectious causes for fever include:  Some arthritis conditions.  Some thyroid or  adrenal gland conditions.  Some immune system conditions.  Some types of cancer.  A medicine reaction.  High doses of certain street drugs such as methamphetamine.  Dehydration.  Exposure to high outside or room temperatures.  Occasionally, the source of a fever cannot be determined. This is sometimes called a "fever of unknown origin" (FUO).  Some situations may lead to a temporary rise in body temperature that may go away on its own. Examples are:  Childbirth.  Surgery.  Intense exercise. HOME CARE INSTRUCTIONS   Take appropriate medicines for fever. Follow dosing instructions carefully. If you use acetaminophen to reduce the fever, be careful to avoid taking other medicines that also contain acetaminophen. Do not take aspirin for a fever if you are younger than age 59. There is an association with Reye's syndrome. Reye's syndrome is a rare but potentially deadly disease.  If an infection is present and antibiotics have been prescribed, take them as directed. Finish them even if you start to feel better.  Rest as needed.  Maintain an adequate fluid intake. To prevent dehydration during an illness with prolonged or recurrent fever, you may need to drink extra fluid.Drink enough fluids to keep your urine clear or pale yellow.  Sponging or bathing with room temperature water may help reduce body temperature. Do not use ice water or alcohol sponge baths.  Dress comfortably, but do not over-bundle. SEEK MEDICAL CARE IF:   You are unable to keep fluids down.  You develop vomiting or diarrhea.  You are not feeling at least partly better after 3 days.  You develop new  symptoms or problems. SEEK IMMEDIATE MEDICAL CARE IF:   You have shortness of breath or trouble breathing.  You develop excessive weakness.  You are dizzy or you faint.  You are extremely thirsty or you are making little or no urine.  You develop new pain that was not there before (such as in the head,  neck, chest, back, or abdomen).  You have persistant vomiting and diarrhea for more than 1 to 2 days.  You develop a stiff neck or your eyes become sensitive to light.  You develop a skin rash.  You have a fever or persistent symptoms for more than 2 to 3 days.  You have a fever and your symptoms suddenly get worse. MAKE SURE YOU:   Understand these instructions.  Will watch your condition.  Will get help right away if you are not doing well or get worse. Document Released: 08/30/2000 Document Revised: 05/29/2011 Document Reviewed: 01/05/2011 [facility] Patient Information 2014 [place], [place].

## 2013-08-18 NOTE — ED Notes (Signed)
Lactic Acid given to Dr. Kathrynn Humble.

## 2013-08-18 NOTE — Progress Notes (Signed)
Pt states he had fever last night of 101.0 . He went to ED, had chest xray, CBC, blood cultures, UA done. He states he was given Tylenol, no other prescriptions. He states his last temp was 100.4. He is afebrile at this time. Pt denies urinary issues today. He denies fatigue, loss of appetite, bowel issues. Pt drinks Gingerale, advised he drink water, non sugar drinks. Advised he drink mostly water, avoid sugary liquids.

## 2013-08-18 NOTE — ED Notes (Signed)
Pt receiving radiation treatment for prostate cancer, last treatment on Friday, tonight he developed a fever and was told to come to the ER, at home pt's fever was 101.9

## 2013-08-18 NOTE — ED Notes (Signed)
Patient reports having periods of chills tonight and just feeling bad.

## 2013-08-19 ENCOUNTER — Ambulatory Visit
Admission: RE | Admit: 2013-08-19 | Discharge: 2013-08-19 | Disposition: A | Discharge status: Home / Self Care | Payer: Medicare Other | Source: Ambulatory Visit | Attending: Radiation Oncology | Admitting: Radiation Oncology

## 2013-08-19 DIAGNOSIS — Z510 Encounter for antineoplastic radiation therapy: Secondary | ICD-10-CM | POA: Diagnosis not present

## 2013-08-20 ENCOUNTER — Ambulatory Visit
Admission: RE | Admit: 2013-08-20 | Discharge: 2013-08-20 | Disposition: A | Discharge status: Home / Self Care | Payer: Medicare Other | Source: Ambulatory Visit | Attending: Radiation Oncology | Admitting: Radiation Oncology

## 2013-08-20 DIAGNOSIS — Z510 Encounter for antineoplastic radiation therapy: Secondary | ICD-10-CM | POA: Diagnosis not present

## 2013-08-20 LAB — URINE CULTURE: Colony Count: 40000

## 2013-08-21 ENCOUNTER — Ambulatory Visit
Admission: RE | Admit: 2013-08-21 | Discharge: 2013-08-21 | Disposition: A | Discharge status: Home / Self Care | Payer: Medicare Other | Source: Ambulatory Visit | Attending: Radiation Oncology | Admitting: Radiation Oncology

## 2013-08-21 ENCOUNTER — Telehealth (HOSPITAL_BASED_OUTPATIENT_CLINIC_OR_DEPARTMENT_OTHER): Payer: Self-pay | Admitting: Emergency Medicine

## 2013-08-21 ENCOUNTER — Encounter: Payer: Self-pay | Admitting: Radiation Oncology

## 2013-08-21 VITALS — BP 146/69 | HR 81 | Temp 98.6°F | Resp 20 | Wt 266.4 lb

## 2013-08-21 DIAGNOSIS — N390 Urinary tract infection, site not specified: Secondary | ICD-10-CM | POA: Insufficient documentation

## 2013-08-21 DIAGNOSIS — C61 Malignant neoplasm of prostate: Secondary | ICD-10-CM

## 2013-08-21 DIAGNOSIS — Z510 Encounter for antineoplastic radiation therapy: Secondary | ICD-10-CM | POA: Diagnosis not present

## 2013-08-21 MED ORDER — CIPROFLOXACIN HCL 500 MG PO TABS: 500.0000 mg | ORAL_TABLET | Freq: Two times a day (BID) | ORAL | Status: DC

## 2013-08-21 NOTE — Progress Notes (Signed)
Weekly Management Note:  Site: Prostate Current Dose:  2145  cGy Projected Dose: 7800  cGy  Narrative: The patient is seen today for routine under treatment assessment. CBCT/MVCT images/port films were reviewed. The chart was reviewed.   The patient is seen today for evaluation of a urinary tract infection. When he was in the emergency room 3 days ago a urine culture was obtained which is now growing Escherichia coli. This is sensitive to Cipro. He denies dysuria or urinary frequency. I am starting him on Cipro 500 mg by mouth every 12 hours for 10 days.  Physical Examination:  Filed Vitals:   08/21/13 1030  BP: 146/69  Pulse: 81  Temp: 98.6 F (37 C)  Resp: 20  .  Weight: 266 lb 6.4 oz (120.838 kg). No change.  Impression: Tolerating radiation therapy well. Starting Cipro for urinary tract infection.  Plan: Continue radiation therapy as planned.

## 2013-08-21 NOTE — Addendum Note (Signed)
Encounter addended by: Andria Rhein, RN on: 08/21/2013 10:48 AM<BR>     Documentation filed: Notes Section

## 2013-08-21 NOTE — Telephone Encounter (Signed)
Post ED Visit - Positive Culture Follow-up  Culture report reviewed by antimicrobial stewardship pharmacist: []  Wes Boundary, Pharm.D., BCPS []  Heide Guile, Pharm.D., BCPS [x]  Alycia Rossetti, Pharm.D., BCPS []  [place], [place].D., BCPS, AAHIVP []  Legrand Como, Pharm.D., BCPS, AAHIVP []  Juliene Pina, Pharm.D.  Positive urine culture Per Domenic Moras PA-C, no treatment needed and no further patient follow-up is required at this time.  Ector Laurel 08/21/2013, 1:32 PM

## 2013-08-21 NOTE — Addendum Note (Signed)
Encounter addended by: Rexene Edison, MD on: 08/21/2013 10:51 AM<BR>     Documentation filed: Notes Section, Problem List, Visit Diagnoses

## 2013-08-21 NOTE — Progress Notes (Signed)
ED Antimicrobial Stewardship Positive Culture Follow Up   Aaron Moss is an 68 y.o. male who presented to [facility] on 08/18/2013 with a chief complaint of fever.  Chief Complaint  Patient presents with  . Fever    Recent Results (from the past 720 hour(s))  URINE CULTURE     Status: None   Collection Time    08/18/13  2:37 AM      Result Value Ref Range Status   Specimen Description URINE, CLEAN CATCH   Final   Special Requests NONE   Final   Culture  Setup Time     Final   Value: 08/18/2013 09:26     Performed at [facility]     Final   Value: 40,000 COLONIES/ML     Performed at [facility]   Culture     Final   Value: ESCHERICHIA COLI     Performed at [facility]   Report Status 08/20/2013 FINAL   Final   Organism ID, Bacteria ESCHERICHIA COLI   Final  CULTURE, BLOOD (ROUTINE X 2)     Status: None   Collection Time    08/18/13  3:50 AM      Result Value Ref Range Status   Specimen Description BLOOD RIGHT HAND   Final   Special Requests BOTTLES DRAWN AEROBIC AND ANAEROBIC 4CC   Final   Culture  Setup Time     Final   Value: 08/18/2013 09:12     Performed at [facility]   Culture     Final   Value:        BLOOD CULTURE RECEIVED NO GROWTH TO DATE CULTURE WILL BE HELD FOR 5 DAYS BEFORE ISSUING A FINAL NEGATIVE REPORT     Performed at [facility]   Report Status PENDING   Incomplete  CULTURE, BLOOD (ROUTINE X 2)     Status: None   Collection Time    08/18/13  3:55 AM      Result Value Ref Range Status   Specimen Description BLOOD LEFT ANTECUBITAL   Final   Special Requests BOTTLES DRAWN AEROBIC AND ANAEROBIC 3CC   Final   Culture  Setup Time     Final   Value: 08/18/2013 09:12     Performed at [facility]   Culture     Final   Value:        BLOOD CULTURE RECEIVED NO GROWTH TO DATE CULTURE WILL BE HELD FOR 5 DAYS BEFORE ISSUING A FINAL NEGATIVE REPORT     Performed at [facility]   Report Status PENDING   Incomplete    [x]  No treatment indicated  42 YOM with prostate cancer started with radiation treatments on 5/20 who presents to the [facility] on 6/1 with fevers (101.9 at home, 99.6 in the ED). The patient is not receiving chemo and is therefor not immunocompromised. Pt was seen by radiology later that day and was stated to be feeling better and afebrile. Would recommend NOT treating d/t low colony count and improving symptoms.  New antibiotic prescription: No treatment indicated  ED Provider: Domenic Moras, PA-C  Aaron Moss 08/21/2013, 9:00 AM Infectious Diseases Pharmacist Phone# [phone]

## 2013-08-21 NOTE — Progress Notes (Addendum)
Pt denies pain, bladder issues, bowel issues, fatigue, loss of appetite. He states he has urinary frequency, but "it is the same as it has been".  Per Dr Valere Dross spoke w/charge RN in [facility] ED and informed her pt has been prescribed Cipro.

## 2013-08-22 ENCOUNTER — Ambulatory Visit
Admission: RE | Admit: 2013-08-22 | Discharge: 2013-08-22 | Disposition: A | Discharge status: Home / Self Care | Payer: Medicare Other | Source: Ambulatory Visit | Attending: Radiation Oncology | Admitting: Radiation Oncology

## 2013-08-22 DIAGNOSIS — Z510 Encounter for antineoplastic radiation therapy: Secondary | ICD-10-CM | POA: Diagnosis not present

## 2013-08-24 LAB — CULTURE, BLOOD (ROUTINE X 2)
CULTURE: NO GROWTH
Culture: NO GROWTH

## 2013-08-25 ENCOUNTER — Ambulatory Visit
Admission: RE | Admit: 2013-08-25 | Discharge: 2013-08-25 | Disposition: A | Discharge status: Home / Self Care | Payer: Medicare Other | Source: Ambulatory Visit | Attending: Radiation Oncology | Admitting: Radiation Oncology

## 2013-08-25 ENCOUNTER — Encounter: Payer: Self-pay | Admitting: Radiation Oncology

## 2013-08-25 VITALS — BP 123/71 | HR 73 | Temp 98.1°F | Resp 20 | Wt 266.7 lb

## 2013-08-25 DIAGNOSIS — Z510 Encounter for antineoplastic radiation therapy: Secondary | ICD-10-CM | POA: Diagnosis not present

## 2013-08-25 DIAGNOSIS — C61 Malignant neoplasm of prostate: Secondary | ICD-10-CM

## 2013-08-25 NOTE — Progress Notes (Signed)
Pt continues to take Cipro for UTI. He denies dysuria, urinary frequency. He states he has some feelings of urgency but can wait to reach a bathroom. He denies pain, fatigue, loss of appetite, bowel issues.

## 2013-08-25 NOTE — Progress Notes (Signed)
   Weekly Management Note:  Outpatient Current Dose:  25.35 Gy  Projected Dose: 78 Gy   Narrative:  The patient presents for routine under treatment assessment.  CBCT/MVCT images/Port film x-rays were reviewed.  The chart was checked. On cipro for UTI. No current fevers, chills, dysuria, or new complaints. Nocturia stable, no diarrhea.  Physical Findings:  weight is 266 lb 11.2 oz (120.974 kg). His oral temperature is 98.1 F (36.7 C). His blood pressure is 123/71 and his pulse is 73. His respiration is 20.  NAD  Impression:  The patient is tolerating radiotherapy.  Plan:  Continue radiotherapy as planned.   ________________________________   Eppie Gibson, M.D.

## 2013-08-26 ENCOUNTER — Ambulatory Visit
Admission: RE | Admit: 2013-08-26 | Discharge: 2013-08-26 | Disposition: A | Discharge status: Home / Self Care | Payer: Medicare Other | Source: Ambulatory Visit | Attending: Radiation Oncology | Admitting: Radiation Oncology

## 2013-08-26 DIAGNOSIS — Z510 Encounter for antineoplastic radiation therapy: Secondary | ICD-10-CM | POA: Diagnosis not present

## 2013-08-27 ENCOUNTER — Ambulatory Visit
Admission: RE | Admit: 2013-08-27 | Discharge: 2013-08-27 | Disposition: A | Discharge status: Home / Self Care | Payer: Medicare Other | Source: Ambulatory Visit | Attending: Radiation Oncology | Admitting: Radiation Oncology

## 2013-08-27 DIAGNOSIS — Z510 Encounter for antineoplastic radiation therapy: Secondary | ICD-10-CM | POA: Diagnosis not present

## 2013-08-28 ENCOUNTER — Ambulatory Visit
Admission: RE | Admit: 2013-08-28 | Discharge: 2013-08-28 | Disposition: A | Discharge status: Home / Self Care | Payer: Medicare Other | Source: Ambulatory Visit | Attending: Radiation Oncology | Admitting: Radiation Oncology

## 2013-08-28 DIAGNOSIS — Z510 Encounter for antineoplastic radiation therapy: Secondary | ICD-10-CM | POA: Diagnosis not present

## 2013-08-29 ENCOUNTER — Ambulatory Visit
Admission: RE | Admit: 2013-08-29 | Discharge: 2013-08-29 | Disposition: A | Discharge status: Home / Self Care | Payer: Medicare Other | Source: Ambulatory Visit | Attending: Radiation Oncology | Admitting: Radiation Oncology

## 2013-08-29 DIAGNOSIS — Z510 Encounter for antineoplastic radiation therapy: Secondary | ICD-10-CM | POA: Diagnosis not present

## 2013-09-01 ENCOUNTER — Ambulatory Visit
Admission: RE | Admit: 2013-09-01 | Discharge: 2013-09-01 | Disposition: A | Discharge status: Home / Self Care | Payer: Medicare Other | Source: Ambulatory Visit | Attending: Radiation Oncology | Admitting: Radiation Oncology

## 2013-09-01 ENCOUNTER — Encounter: Payer: Self-pay | Admitting: Radiation Oncology

## 2013-09-01 VITALS — BP 137/73 | HR 69 | Temp 97.5°F | Resp 20 | Wt 270.3 lb

## 2013-09-01 DIAGNOSIS — Z510 Encounter for antineoplastic radiation therapy: Secondary | ICD-10-CM | POA: Diagnosis not present

## 2013-09-01 DIAGNOSIS — C61 Malignant neoplasm of prostate: Secondary | ICD-10-CM

## 2013-09-01 NOTE — Progress Notes (Addendum)
Pt denies pain, fatigue, loss of appetite, bowel issues. He states his urinary stream is weak in the mornings, normal otherwise. He denies dysuria or other urinary issues. He has completed Cipro.

## 2013-09-01 NOTE — Progress Notes (Signed)
Weekly Management Note:  Site: Prostate Current Dose:  2535  cGy Projected Dose: 7800  cGy  Narrative: The patient is seen today for routine under treatment assessment. CBCT/MVCT images/port films were reviewed. The chart was reviewed.   Bladder filling is satisfactory. No GU or GI difficulty. He finished his Cipro antibiotic for his UTI.  Physical Examination:  Filed Vitals:   09/01/13 1119  BP: 137/73  Pulse: 69  Temp: 97.5 F (36.4 C)  Resp: 20  .  Weight: 270 lb 4.8 oz (122.607 kg). No change.  Impression: Tolerating radiation therapy well.  Plan: Continue radiation therapy as planned.

## 2013-09-02 ENCOUNTER — Ambulatory Visit
Admission: RE | Admit: 2013-09-02 | Discharge: 2013-09-02 | Disposition: A | Discharge status: Home / Self Care | Payer: Medicare Other | Source: Ambulatory Visit | Attending: Radiation Oncology | Admitting: Radiation Oncology

## 2013-09-02 DIAGNOSIS — Z510 Encounter for antineoplastic radiation therapy: Secondary | ICD-10-CM | POA: Diagnosis not present

## 2013-09-03 ENCOUNTER — Ambulatory Visit: Payer: Medicare Other | Admitting: Internal Medicine

## 2013-09-03 ENCOUNTER — Ambulatory Visit
Admission: RE | Admit: 2013-09-03 | Discharge: 2013-09-03 | Disposition: A | Discharge status: Home / Self Care | Payer: Medicare Other | Source: Ambulatory Visit | Attending: Radiation Oncology | Admitting: Radiation Oncology

## 2013-09-03 ENCOUNTER — Encounter: Payer: Self-pay | Admitting: Internal Medicine

## 2013-09-03 ENCOUNTER — Ambulatory Visit (INDEPENDENT_AMBULATORY_CARE_PROVIDER_SITE_OTHER): Payer: Medicare Other | Admitting: Internal Medicine

## 2013-09-03 VITALS — BP 122/70 | HR 79 | Temp 98.7°F | Ht 66.0 in | Wt 267.0 lb

## 2013-09-03 DIAGNOSIS — Z510 Encounter for antineoplastic radiation therapy: Secondary | ICD-10-CM | POA: Diagnosis not present

## 2013-09-03 DIAGNOSIS — I10 Essential (primary) hypertension: Secondary | ICD-10-CM

## 2013-09-03 DIAGNOSIS — E785 Hyperlipidemia, unspecified: Secondary | ICD-10-CM

## 2013-09-03 NOTE — Progress Notes (Signed)
Subjective:    Patient ID: Aaron Moss, male    DOB: April 02, 1945, 68 y.o.   MRN: [id number]  HPI  Here to f/u; overall doing ok,  Pt denies chest pain, increased sob or doe, wheezing, orthopnea, PND, increased LE swelling, palpitations, dizziness or syncope.  Pt denies polydipsia, polyuria, or low sugar symptoms such as weakness or confusion improved with po intake.  Pt denies new neurological symptoms such as new headache, or facial or extremity weakness or numbness.   Pt states overall good compliance with meds, has been trying to follow lower cholesterol diet, with wt overall stable,  but little exercise however.   Past Medical History  Diagnosis Date  . Hypertension   . Hyperlipidemia   . History of colon cancer   . Left knee DJD   . Diverticulosis of colon   . 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 Colon cancer 2000  . History of chemotherapy 2000    colon cancer   Past Surgical History  Procedure Laterality Date  . Colon surgery  2000    colon cancer  . Knee arthroscopy  2007    LT- [facility]  . Colonoscopy    . Eus      pancreatic cyst  . Prostate biopsy  05/23/13    gleason 7, volume 11.5 ml    reports that he quit smoking about 25 years ago. His smoking use included Cigarettes. He has a 22 pack-year smoking history. He has never used smokeless tobacco. He reports that he drinks alcohol. He reports that he does not use illicit drugs. family history includes Cancer in his brother, maternal grandmother, and mother; Colon cancer in his sister; Diabetes in his brother. Allergies  Allergen Reactions  . Pregabalin     REACTION: felt bad   Current Outpatient Prescriptions on File Prior to Visit  Medication Sig Dispense Refill  . amLODipine (NORVASC) 10 MG tablet Take 10 mg by mouth daily.      Marland Kitchen aspirin EC 81 MG tablet Take 81 mg by mouth daily.        Marland Kitchen atorvastatin (LIPITOR) 40 MG tablet Take 1 tablet (40 mg total) by mouth daily.  90 tablet  3  .  benazepril (LOTENSIN) 40 MG tablet Take 40 mg by mouth daily.      . diclofenac (VOLTAREN) 75 MG EC tablet Take 75 mg by mouth daily.      . hydrochlorothiazide (HYDRODIURIL) 25 MG tablet Take 25 mg by mouth daily.      . potassium chloride (KLOR-CON 10) 10 MEQ tablet Take 1 tablet (10 mEq total) by mouth daily.  90 tablet  3  . traMADol (ULTRAM) 50 MG tablet Take 50 mg by mouth every 6 (six) hours as needed for moderate pain.      . vardenafil (LEVITRA) 20 MG tablet Take 1 tablet (20 mg total) by mouth as needed for erectile dysfunction.  5 tablet  11   No current facility-administered medications on file prior to visit.   Review of Systems All otherwise neg per pt     Objective:   Physical Exam BP 122/70  Pulse 79  Temp(Src) 98.7 F (37.1 C) (Oral)  Ht 5\' 6"  (1.676 m)  Wt 267 lb (121.11 kg)  BMI 43.12 kg/m2  SpO2 95% VS noted,  Constitutional: Pt appears well-developed, well-nourished. [name] HENT: Head: NCAT.  Right Ear: External ear normal.  Left Ear: External ear normal.  Eyes: .  Pupils are equal, round, and reactive to light. Conjunctivae and EOM are normal Neck: Normal range of motion. Neck supple.  Cardiovascular: Normal rate and regular rhythm.   Pulmonary/Chest: Effort normal and breath sounds normal.  Abd:  Soft, NT, ND, + BS Neurological: Pt is alert. Not confused , motor grossly intact Skin: Skin is warm. No rash Psychiatric: Pt behavior is normal. No agitation.     Assessment & Plan:

## 2013-09-03 NOTE — Progress Notes (Signed)
Pre visit review using our clinic review tool, if applicable. No additional management support is needed unless otherwise documented below in the visit note. 

## 2013-09-03 NOTE — Assessment & Plan Note (Signed)
stable overall by history and exam, recent data reviewed with pt, and pt to continue medical treatment as before,  to f/u any worsening symptoms or concerns Lab Results  Component Value Date   LDLCALC 94 09/04/2012

## 2013-09-03 NOTE — Patient Instructions (Signed)
Please continue all other medications as before, and refills have been done if requested.  Please have the pharmacy call with any other refills you may need.  Please continue your efforts at being more active, low cholesterol diet, and weight control.  You are otherwise up to date with prevention measures today.  Please keep your appointments with your specialists as you may have planned  Please return in 6 months, or sooner if needed 

## 2013-09-03 NOTE — Assessment & Plan Note (Signed)
stable overall by history and exam, recent data reviewed with pt, and pt to continue medical treatment as before,  to f/u any worsening symptoms or concerns BP Readings from Last 3 Encounters:  09/03/13 122/70  09/01/13 137/73  08/25/13 123/71

## 2013-09-04 ENCOUNTER — Ambulatory Visit
Admission: RE | Admit: 2013-09-04 | Discharge: 2013-09-04 | Disposition: A | Discharge status: Home / Self Care | Payer: Medicare Other | Source: Ambulatory Visit | Attending: Radiation Oncology | Admitting: Radiation Oncology

## 2013-09-04 DIAGNOSIS — Z510 Encounter for antineoplastic radiation therapy: Secondary | ICD-10-CM | POA: Diagnosis not present

## 2013-09-05 ENCOUNTER — Ambulatory Visit
Admission: RE | Admit: 2013-09-05 | Discharge: 2013-09-05 | Disposition: A | Discharge status: Home / Self Care | Payer: Medicare Other | Source: Ambulatory Visit | Attending: Radiation Oncology | Admitting: Radiation Oncology

## 2013-09-05 DIAGNOSIS — Z510 Encounter for antineoplastic radiation therapy: Secondary | ICD-10-CM | POA: Diagnosis not present

## 2013-09-08 ENCOUNTER — Ambulatory Visit
Admission: RE | Admit: 2013-09-08 | Discharge: 2013-09-08 | Disposition: A | Discharge status: Home / Self Care | Payer: Medicare Other | Source: Ambulatory Visit | Attending: Radiation Oncology | Admitting: Radiation Oncology

## 2013-09-08 ENCOUNTER — Other Ambulatory Visit: Payer: Self-pay | Admitting: Internal Medicine

## 2013-09-08 ENCOUNTER — Encounter: Payer: Self-pay | Admitting: Radiation Oncology

## 2013-09-08 VITALS — BP 133/73 | HR 71 | Temp 97.9°F | Resp 20 | Wt 267.8 lb

## 2013-09-08 DIAGNOSIS — C61 Malignant neoplasm of prostate: Secondary | ICD-10-CM

## 2013-09-08 DIAGNOSIS — Z510 Encounter for antineoplastic radiation therapy: Secondary | ICD-10-CM | POA: Diagnosis not present

## 2013-09-08 NOTE — Progress Notes (Addendum)
Patient denies pain but states he has noticed burning at end of voiding. He denies other bladder issues, denies bowel issues, loss of appetite. He is fatigue but states it may be due to heat. Advised pt drink lots of fluids, limit caffeine. He is drinking cranberry juice.

## 2013-09-08 NOTE — Telephone Encounter (Signed)
Done hardcopy to [facility]  

## 2013-09-08 NOTE — Progress Notes (Signed)
Weekly Management Note:  Site: Prostate Current Dose:  4485  cGy Projected Dose: 7800  cGy  Narrative: The patient is seen today for routine under treatment assessment. CBCT/MVCT images/port films were reviewed. The chart was reviewed.   Bladder filling satisfactory. No GU or GI difficulty except for slight dysuria upon completion of urination. No change in urine clarity or smell. Earlier in his treatment he did have a UTI.  Physical Examination:  Filed Vitals:   09/08/13 1044  BP: 133/73  Pulse: 71  Temp: 97.9 F (36.6 C)  Resp: 20  .  Weight: 267 lb 12.8 oz (121.473 kg). No change.  Impression: Tolerating radiation therapy well, although he does have mild dysuria on completion of urination. I doubt that he has a urinary tract infection. I told that he may take ibuprofen or Aleve when necessary dysuria. If his symptoms worsen and we will obtain a urinalysis/culture.  Plan: Continue radiation therapy as planned.

## 2013-09-09 ENCOUNTER — Ambulatory Visit
Admission: RE | Admit: 2013-09-09 | Discharge: 2013-09-09 | Disposition: A | Discharge status: Home / Self Care | Payer: Medicare Other | Source: Ambulatory Visit | Attending: Radiation Oncology | Admitting: Radiation Oncology

## 2013-09-09 DIAGNOSIS — Z510 Encounter for antineoplastic radiation therapy: Secondary | ICD-10-CM | POA: Diagnosis not present

## 2013-09-09 NOTE — Telephone Encounter (Signed)
Faxed hardcopy to [place]

## 2013-09-10 ENCOUNTER — Ambulatory Visit: Payer: Medicare Other

## 2013-09-11 ENCOUNTER — Ambulatory Visit
Admission: RE | Admit: 2013-09-11 | Discharge: 2013-09-11 | Disposition: A | Discharge status: Home / Self Care | Payer: Medicare Other | Source: Ambulatory Visit | Attending: Radiation Oncology | Admitting: Radiation Oncology

## 2013-09-11 DIAGNOSIS — Z510 Encounter for antineoplastic radiation therapy: Secondary | ICD-10-CM | POA: Diagnosis not present

## 2013-09-12 ENCOUNTER — Ambulatory Visit
Admission: RE | Admit: 2013-09-12 | Discharge: 2013-09-12 | Disposition: A | Discharge status: Home / Self Care | Payer: Medicare Other | Source: Ambulatory Visit | Attending: Radiation Oncology | Admitting: Radiation Oncology

## 2013-09-12 DIAGNOSIS — Z510 Encounter for antineoplastic radiation therapy: Secondary | ICD-10-CM | POA: Diagnosis not present

## 2013-09-15 ENCOUNTER — Ambulatory Visit
Admission: RE | Admit: 2013-09-15 | Discharge: 2013-09-15 | Disposition: A | Discharge status: Home / Self Care | Payer: Medicare Other | Source: Ambulatory Visit | Attending: Radiation Oncology | Admitting: Radiation Oncology

## 2013-09-15 ENCOUNTER — Encounter: Payer: Self-pay | Admitting: Radiation Oncology

## 2013-09-15 VITALS — BP 155/66 | HR 79 | Temp 98.0°F | Resp 20 | Wt 269.3 lb

## 2013-09-15 DIAGNOSIS — C61 Malignant neoplasm of prostate: Secondary | ICD-10-CM

## 2013-09-15 DIAGNOSIS — Z510 Encounter for antineoplastic radiation therapy: Secondary | ICD-10-CM | POA: Diagnosis not present

## 2013-09-15 NOTE — Progress Notes (Signed)
   Weekly Management Note:  outpatient Current Dose:  52.65 Gy  Projected Dose: 78 Gy  Prostate  Narrative:  The patient presents for routine under treatment assessment.  CBCT/MVCT images/Port film x-rays were reviewed.  The chart was checked. Urinary sx stable from last week. No new complaints.  Physical Findings:  weight is 269 lb 4.8 oz (122.154 kg). His temperature is 98 F (36.7 C). His blood pressure is 155/66 and his pulse is 79. His respiration is 20.  NAD  Impression:  The patient is tolerating radiotherapy.  Plan:  Continue radiotherapy as planned.   ________________________________   Eppie Gibson, M.D.

## 2013-09-15 NOTE — Progress Notes (Signed)
Patient denies pain, fatigue, loss of appetite, bowel issues. He states he has urinary frequency, urgency, slight dysuria.

## 2013-09-16 ENCOUNTER — Ambulatory Visit
Admission: RE | Admit: 2013-09-16 | Discharge: 2013-09-16 | Disposition: A | Discharge status: Home / Self Care | Payer: Medicare Other | Source: Ambulatory Visit | Attending: Radiation Oncology | Admitting: Radiation Oncology

## 2013-09-16 DIAGNOSIS — Z510 Encounter for antineoplastic radiation therapy: Secondary | ICD-10-CM | POA: Diagnosis not present

## 2013-09-17 ENCOUNTER — Ambulatory Visit
Admission: RE | Admit: 2013-09-17 | Discharge: 2013-09-17 | Disposition: A | Discharge status: Home / Self Care | Payer: Medicare Other | Source: Ambulatory Visit | Attending: Radiation Oncology | Admitting: Radiation Oncology

## 2013-09-17 DIAGNOSIS — Z510 Encounter for antineoplastic radiation therapy: Secondary | ICD-10-CM | POA: Diagnosis not present

## 2013-09-18 ENCOUNTER — Ambulatory Visit
Admission: RE | Admit: 2013-09-18 | Discharge: 2013-09-18 | Disposition: A | Discharge status: Home / Self Care | Payer: Medicare Other | Source: Ambulatory Visit | Attending: Radiation Oncology | Admitting: Radiation Oncology

## 2013-09-18 DIAGNOSIS — Z510 Encounter for antineoplastic radiation therapy: Secondary | ICD-10-CM | POA: Diagnosis not present

## 2013-09-22 ENCOUNTER — Ambulatory Visit
Admission: RE | Admit: 2013-09-22 | Discharge: 2013-09-22 | Disposition: A | Discharge status: Home / Self Care | Payer: Medicare Other | Source: Ambulatory Visit | Attending: Radiation Oncology | Admitting: Radiation Oncology

## 2013-09-22 VITALS — BP 152/71 | HR 76 | Temp 98.2°F | Resp 14 | Wt 267.9 lb

## 2013-09-22 DIAGNOSIS — C61 Malignant neoplasm of prostate: Secondary | ICD-10-CM

## 2013-09-22 DIAGNOSIS — Z510 Encounter for antineoplastic radiation therapy: Secondary | ICD-10-CM | POA: Diagnosis not present

## 2013-09-22 NOTE — Progress Notes (Signed)
Weekly Management Note:  Site: Prostate Current Dose:  6045  cGy Projected Dose: 7800  cGy  Narrative: The patient is seen today for routine under treatment assessment. CBCT/MVCT images/port films were reviewed. The chart was reviewed.   Filling is satisfactory. He does have intermittent mild dysuria with slight increase in nocturia x3-4. No GI difficulty.  Physical Examination:  Filed Vitals:   09/22/13 1209  BP: 152/71  Pulse: 76  Temp: 98.2 F (36.8 C)  Resp: 14  .  Weight: 267 lb 14.4 oz (121.519 kg). No change.  Impression: Tolerating radiation therapy well.  Plan: Continue radiation therapy as planned.

## 2013-09-22 NOTE — Progress Notes (Signed)
He is currently in no pain. Pt complains of, Fatigue and Generalized Weakness.  Reports urinary frequency and reports urinary burning occasionally.  Pt states they urinate 3 - 4 times per night.  Pt reports, a soft formed bowel movement every once day. The patient eats a regular, healthy diet.

## 2013-09-23 ENCOUNTER — Other Ambulatory Visit: Payer: Self-pay | Admitting: Internal Medicine

## 2013-09-23 ENCOUNTER — Ambulatory Visit
Admission: RE | Admit: 2013-09-23 | Discharge: 2013-09-23 | Disposition: A | Discharge status: Home / Self Care | Payer: Medicare Other | Source: Ambulatory Visit | Attending: Radiation Oncology | Admitting: Radiation Oncology

## 2013-09-23 ENCOUNTER — Ambulatory Visit: Payer: Medicare Other

## 2013-09-23 DIAGNOSIS — Z510 Encounter for antineoplastic radiation therapy: Secondary | ICD-10-CM | POA: Diagnosis not present

## 2013-09-24 ENCOUNTER — Ambulatory Visit: Payer: Medicare Other

## 2013-09-24 ENCOUNTER — Ambulatory Visit
Admission: RE | Admit: 2013-09-24 | Discharge: 2013-09-24 | Disposition: A | Discharge status: Home / Self Care | Payer: Medicare Other | Source: Ambulatory Visit | Attending: Radiation Oncology | Admitting: Radiation Oncology

## 2013-09-24 DIAGNOSIS — Z510 Encounter for antineoplastic radiation therapy: Secondary | ICD-10-CM | POA: Diagnosis not present

## 2013-09-25 ENCOUNTER — Ambulatory Visit
Admission: RE | Admit: 2013-09-25 | Discharge: 2013-09-25 | Disposition: A | Discharge status: Home / Self Care | Payer: Medicare Other | Source: Ambulatory Visit | Attending: Radiation Oncology | Admitting: Radiation Oncology

## 2013-09-25 DIAGNOSIS — Z510 Encounter for antineoplastic radiation therapy: Secondary | ICD-10-CM | POA: Diagnosis not present

## 2013-09-26 ENCOUNTER — Ambulatory Visit
Admission: RE | Admit: 2013-09-26 | Discharge: 2013-09-26 | Disposition: A | Discharge status: Home / Self Care | Payer: Medicare Other | Source: Ambulatory Visit | Attending: Radiation Oncology | Admitting: Radiation Oncology

## 2013-09-26 DIAGNOSIS — Z510 Encounter for antineoplastic radiation therapy: Secondary | ICD-10-CM | POA: Diagnosis not present

## 2013-09-29 ENCOUNTER — Ambulatory Visit
Admission: RE | Admit: 2013-09-29 | Discharge: 2013-09-29 | Disposition: A | Discharge status: Home / Self Care | Payer: Medicare Other | Source: Ambulatory Visit | Attending: Radiation Oncology | Admitting: Radiation Oncology

## 2013-09-29 ENCOUNTER — Encounter: Payer: Self-pay | Admitting: Radiation Oncology

## 2013-09-29 VITALS — BP 157/71 | HR 90 | Temp 97.8°F | Resp 20 | Wt 269.3 lb

## 2013-09-29 DIAGNOSIS — C61 Malignant neoplasm of prostate: Secondary | ICD-10-CM

## 2013-09-29 DIAGNOSIS — Z510 Encounter for antineoplastic radiation therapy: Secondary | ICD-10-CM | POA: Diagnosis not present

## 2013-09-29 NOTE — Progress Notes (Signed)
Patient reports dysuria but not w/every voiding, urinary urgency. He denies other bladder issues, bowel issues, loss of appetite. He is fatigued. Pt will complete Fri, gave him 1 month FU card.

## 2013-09-29 NOTE — Progress Notes (Signed)
Weekly Management Note:  Site: Prostate Current Dose:  7020  cGy Projected Dose: 7800  cGy  Narrative: The patient is seen today for routine under treatment assessment. CBCT/MVCT images/port films were reviewed. The chart was reviewed.   Bladder filling is satisfactory. No new GU or GI difficulty. Dysuria is mild. He finishes his treatment this Friday.  Physical Examination:  Filed Vitals:   09/29/13 1025  BP: 157/71  Pulse: 90  Temp: 97.8 F (36.6 C)  Resp: 20  .  Weight: 269 lb 4.8 oz (122.154 kg). No change.  Impression: Tolerating radiation therapy well. One-month followup after completion of radiation therapy this Friday.  Plan: Continue radiation therapy as planned.

## 2013-09-30 ENCOUNTER — Ambulatory Visit
Admission: RE | Admit: 2013-09-30 | Discharge: 2013-09-30 | Disposition: A | Discharge status: Home / Self Care | Payer: Medicare Other | Source: Ambulatory Visit | Attending: Radiation Oncology | Admitting: Radiation Oncology

## 2013-09-30 DIAGNOSIS — Z510 Encounter for antineoplastic radiation therapy: Secondary | ICD-10-CM | POA: Insufficient documentation

## 2013-09-30 DIAGNOSIS — C61 Malignant neoplasm of prostate: Secondary | ICD-10-CM | POA: Insufficient documentation

## 2013-10-01 ENCOUNTER — Ambulatory Visit
Admission: RE | Admit: 2013-10-01 | Discharge: 2013-10-01 | Disposition: A | Discharge status: Home / Self Care | Payer: Medicare Other | Source: Ambulatory Visit | Attending: Radiation Oncology | Admitting: Radiation Oncology

## 2013-10-01 DIAGNOSIS — Z510 Encounter for antineoplastic radiation therapy: Secondary | ICD-10-CM | POA: Diagnosis not present

## 2013-10-01 NOTE — Addendum Note (Signed)
Encounter addended by: Andria Rhein, RN on: 10/01/2013  9:47 AM<BR>     Documentation filed: Notes Section

## 2013-10-02 ENCOUNTER — Ambulatory Visit
Admission: RE | Admit: 2013-10-02 | Discharge: 2013-10-02 | Disposition: A | Discharge status: Home / Self Care | Payer: Medicare Other | Source: Ambulatory Visit | Attending: Radiation Oncology | Admitting: Radiation Oncology

## 2013-10-02 ENCOUNTER — Ambulatory Visit: Payer: Medicare Other

## 2013-10-02 DIAGNOSIS — Z510 Encounter for antineoplastic radiation therapy: Secondary | ICD-10-CM | POA: Diagnosis not present

## 2013-10-03 ENCOUNTER — Ambulatory Visit: Payer: Medicare Other

## 2013-10-03 ENCOUNTER — Ambulatory Visit
Admission: RE | Admit: 2013-10-03 | Discharge: 2013-10-03 | Disposition: A | Discharge status: Home / Self Care | Payer: Medicare Other | Source: Ambulatory Visit | Attending: Radiation Oncology | Admitting: Radiation Oncology

## 2013-10-03 DIAGNOSIS — Z510 Encounter for antineoplastic radiation therapy: Secondary | ICD-10-CM | POA: Diagnosis not present

## 2013-10-12 ENCOUNTER — Encounter: Payer: Self-pay | Admitting: Radiation Oncology

## 2013-10-12 NOTE — Progress Notes (Signed)
[facility] End of Treatment Note  Name:Aaron Moss  Date: 10/12/2013 CBS:[id number] DOB:30-Nov-1945   Status:outpatient    CC: Cathlean Cower, MD  Dr. Preston Fleeting  REFERRING PHYSICIAN:  Dr. Preston Fleeting   DIAGNOSIS:  Stage TI C. intermediate risk adenocarcinoma prostate  INDICATION FOR TREATMENT: Curative   TREATMENT DATES: 08/06/2013 through 10/03/2013                          SITE/DOSE:   Prostate 7800 cGy in 40 sessions, seminal vesicles 5600 cGy 40 sessions                         BEAMS/ENERGY:   Dual ARC VMAT IMRT with 6 MV photons                NARRATIVE:  He tolerated treatment well although he did have an Escherichia coli UTI early during his course of radiation therapy. He was treated with Cipro and had full recovery. He had mild dysuria on completion of radiation therapy as expected.                          PLAN: Routine followup in one month. Patient instructed to call if questions or worsening complaints in interim.

## 2013-10-17 ENCOUNTER — Other Ambulatory Visit: Payer: Self-pay | Admitting: Internal Medicine

## 2013-10-31 ENCOUNTER — Encounter: Payer: Self-pay | Admitting: *Deleted

## 2013-11-05 ENCOUNTER — Ambulatory Visit
Admission: RE | Admit: 2013-11-05 | Discharge: 2013-11-05 | Disposition: A | Discharge status: Home / Self Care | Payer: Medicare Other | Source: Ambulatory Visit | Attending: Radiation Oncology | Admitting: Radiation Oncology

## 2013-11-05 ENCOUNTER — Encounter: Payer: Self-pay | Admitting: Radiation Oncology

## 2013-11-05 VITALS — BP 151/63 | HR 74 | Temp 98.0°F | Resp 20 | Wt 269.5 lb

## 2013-11-05 DIAGNOSIS — C61 Malignant neoplasm of prostate: Secondary | ICD-10-CM

## 2013-11-05 NOTE — Progress Notes (Signed)
CC: Dr. Preston Fleeting  Followup note:  Aaron Moss returns today approximately 1 month following completion of external beam/IMRT in the management of his stage TI C. intermediate risk adenocarcinoma prostate. His dysuria has resolved. He is having less urinary frequency. He does have some "tingling" when he begins to avoid. No GI difficulties. He was seen by Dr. Diona Fanti and had a UA last week which was normal.  Physical examination: Alert and oriented. Filed Vitals:   11/05/13 0959  BP: 151/63  Pulse: 74  Temp: 98 F (36.7 C)  Resp: 20   Rectal examination not performed today.  Impression: Satisfactory progress.  Plan: I have not scheduled the patient for a formal followup visit and I ask that Dr. Diona Fanti keep me posted on his progress.

## 2013-11-05 NOTE — Progress Notes (Signed)
Patient denies pain, loss of appetite, bowel issues. He states his energy level is improving, has occasional slight tingling when he voids, less urinary frequency, nocturia. He denies other urinary issues. Pt saw Dr Diona Fanti last week, had UA done which was normal.

## 2013-11-19 ENCOUNTER — Other Ambulatory Visit: Payer: Self-pay | Admitting: Internal Medicine

## 2013-11-19 NOTE — Telephone Encounter (Signed)
voltaren refill done erx

## 2014-04-08 ENCOUNTER — Other Ambulatory Visit: Payer: Self-pay | Admitting: Internal Medicine

## 2014-04-08 NOTE — Telephone Encounter (Signed)
Faxed hardcopy for Tramadol to [place]

## 2014-04-08 NOTE — Telephone Encounter (Signed)
Done hardcopy to [facility]  

## 2014-04-22 ENCOUNTER — Ambulatory Visit: Payer: Self-pay

## 2014-04-27 ENCOUNTER — Ambulatory Visit (INDEPENDENT_AMBULATORY_CARE_PROVIDER_SITE_OTHER): Payer: Medicare Other

## 2014-04-27 DIAGNOSIS — Z23 Encounter for immunization: Secondary | ICD-10-CM

## 2014-05-10 ENCOUNTER — Other Ambulatory Visit: Payer: Self-pay | Admitting: Internal Medicine

## 2014-08-31 ENCOUNTER — Other Ambulatory Visit: Payer: Self-pay | Admitting: Internal Medicine

## 2014-09-12 ENCOUNTER — Other Ambulatory Visit: Payer: Self-pay | Admitting: Internal Medicine

## 2014-09-16 ENCOUNTER — Other Ambulatory Visit: Payer: Self-pay | Admitting: Internal Medicine

## 2014-09-17 ENCOUNTER — Other Ambulatory Visit: Payer: Self-pay | Admitting: Internal Medicine

## 2014-09-17 NOTE — Telephone Encounter (Signed)
Patient need refill of Tramadol

## 2014-09-18 ENCOUNTER — Other Ambulatory Visit: Payer: Self-pay | Admitting: Internal Medicine

## 2014-09-18 NOTE — Telephone Encounter (Signed)
Rx faxed to pharmacy  

## 2014-09-18 NOTE — Telephone Encounter (Signed)
Done hardcopy to [facility]   Due for ROV

## 2014-09-18 NOTE — Telephone Encounter (Signed)
Please advise, thanks.

## 2014-09-21 ENCOUNTER — Other Ambulatory Visit: Payer: Self-pay | Admitting: Internal Medicine

## 2014-10-20 ENCOUNTER — Other Ambulatory Visit: Payer: Self-pay | Admitting: Internal Medicine

## 2014-11-08 ENCOUNTER — Other Ambulatory Visit: Payer: Self-pay | Admitting: Internal Medicine

## 2014-11-09 NOTE — Telephone Encounter (Signed)
i will be refillling rx request for statin drug----please advise on tramadol rx request, thanks

## 2014-11-10 NOTE — Telephone Encounter (Signed)
Done hardcopy to [facility]  

## 2014-11-10 NOTE — Telephone Encounter (Signed)
Pt called in and would

## 2014-11-11 NOTE — Telephone Encounter (Signed)
Rx faxed to pharmacy  

## 2014-11-20 ENCOUNTER — Other Ambulatory Visit: Payer: Self-pay | Admitting: Internal Medicine

## 2014-11-27 ENCOUNTER — Encounter: Payer: Self-pay | Admitting: Internal Medicine

## 2014-11-27 ENCOUNTER — Telehealth: Payer: Self-pay

## 2014-11-27 ENCOUNTER — Ambulatory Visit (INDEPENDENT_AMBULATORY_CARE_PROVIDER_SITE_OTHER): Payer: Medicare Other | Admitting: Internal Medicine

## 2014-11-27 ENCOUNTER — Other Ambulatory Visit (INDEPENDENT_AMBULATORY_CARE_PROVIDER_SITE_OTHER): Payer: Medicare Other

## 2014-11-27 VITALS — BP 154/92 | HR 54 | Temp 98.9°F | Ht 66.0 in | Wt 272.0 lb

## 2014-11-27 DIAGNOSIS — I10 Essential (primary) hypertension: Secondary | ICD-10-CM

## 2014-11-27 DIAGNOSIS — Z0000 Encounter for general adult medical examination without abnormal findings: Secondary | ICD-10-CM

## 2014-11-27 DIAGNOSIS — E785 Hyperlipidemia, unspecified: Secondary | ICD-10-CM

## 2014-11-27 DIAGNOSIS — D72829 Elevated white blood cell count, unspecified: Secondary | ICD-10-CM

## 2014-11-27 LAB — CBC WITH DIFFERENTIAL/PLATELET
BASOS ABS: 0.1 10*3/uL (ref 0.0–0.1)
BASOS PCT: 0.4 % (ref 0.0–3.0)
EOS ABS: 0 10*3/uL (ref 0.0–0.7)
Eosinophils Relative: 0 % (ref 0.0–5.0)
HCT: 42.7 % (ref 39.0–52.0)
Hemoglobin: 13.8 g/dL (ref 13.0–17.0)
LYMPHS ABS: 2.3 10*3/uL (ref 0.7–4.0)
LYMPHS PCT: 11.8 % — AB (ref 12.0–46.0)
MCHC: 32.3 g/dL (ref 30.0–36.0)
MCV: 80.2 fl (ref 78.0–100.0)
MONO ABS: 0.8 10*3/uL (ref 0.1–1.0)
Monocytes Relative: 3.9 % (ref 3.0–12.0)
NEUTROS ABS: 16.2 10*3/uL — AB (ref 1.4–7.7)
NEUTROS PCT: 83.9 % — AB (ref 43.0–77.0)
PLATELETS: 279 10*3/uL (ref 150.0–400.0)
RBC: 5.32 Mil/uL (ref 4.22–5.81)
RDW: 17.2 % — AB (ref 11.5–15.5)
WBC: 19.3 10*3/uL (ref 4.0–10.5)

## 2014-11-27 LAB — URINALYSIS, ROUTINE W REFLEX MICROSCOPIC
Bilirubin Urine: NEGATIVE
Hgb urine dipstick: NEGATIVE
KETONES UR: NEGATIVE
Leukocytes, UA: NEGATIVE
Nitrite: NEGATIVE
PH: 5.5 (ref 5.0–8.0)
SPECIFIC GRAVITY, URINE: 1.02 (ref 1.000–1.030)
Total Protein, Urine: NEGATIVE
URINE GLUCOSE: NEGATIVE
UROBILINOGEN UA: 0.2 (ref 0.0–1.0)

## 2014-11-27 LAB — BASIC METABOLIC PANEL
BUN: 17 mg/dL (ref 6–23)
CHLORIDE: 108 meq/L (ref 96–112)
CO2: 27 mEq/L (ref 19–32)
CREATININE: 1.09 mg/dL (ref 0.40–1.50)
Calcium: 9.3 mg/dL (ref 8.4–10.5)
GFR: 85.77 mL/min (ref >60.00)
GLUCOSE: 84 mg/dL (ref 70–99)
POTASSIUM: 4 meq/L (ref 3.5–5.1)
Sodium: 143 mEq/L (ref 135–145)

## 2014-11-27 LAB — LIPID PANEL
CHOLESTEROL: 135 mg/dL (ref 0–200)
HDL: 36.9 mg/dL — AB (ref >39.00)
LDL CALC: 85 mg/dL (ref 0–99)
NonHDL: 98.19
TRIGLYCERIDES: 65 mg/dL (ref 0.0–149.0)
Total CHOL/HDL Ratio: 4
VLDL: 13 mg/dL (ref 0.0–40.0)

## 2014-11-27 LAB — HEPATIC FUNCTION PANEL
ALK PHOS: 97 U/L (ref 39–117)
ALT: 13 U/L (ref 0–53)
AST: 12 U/L (ref 0–37)
Albumin: 3.9 g/dL (ref 3.5–5.2)
BILIRUBIN DIRECT: 0.1 mg/dL (ref 0.0–0.3)
BILIRUBIN TOTAL: 0.7 mg/dL (ref 0.2–1.2)
TOTAL PROTEIN: 7.3 g/dL (ref 6.0–8.3)

## 2014-11-27 LAB — TSH: TSH: 1.3 u[IU]/mL (ref 0.35–4.50)

## 2014-11-27 MED ORDER — AMLODIPINE BESYLATE 10 MG PO TABS: 10.0000 mg | ORAL_TABLET | Freq: Every day | ORAL | Status: DC

## 2014-11-27 MED ORDER — POTASSIUM CHLORIDE ER 10 MEQ PO TBCR: 10.0000 meq | EXTENDED_RELEASE_TABLET | Freq: Every day | ORAL | Status: DC

## 2014-11-27 MED ORDER — BENAZEPRIL HCL 40 MG PO TABS: 40.0000 mg | ORAL_TABLET | Freq: Every day | ORAL | Status: DC

## 2014-11-27 MED ORDER — ATORVASTATIN CALCIUM 40 MG PO TABS: 40.0000 mg | ORAL_TABLET | Freq: Every day | ORAL | Status: DC

## 2014-11-27 MED ORDER — HYDROCHLOROTHIAZIDE 25 MG PO TABS: 25.0000 mg | ORAL_TABLET | Freq: Every day | ORAL | Status: DC

## 2014-11-27 NOTE — Telephone Encounter (Signed)
Pt was afeb and exam ok at visit today  No suggestion of infection by exam  Other labs pending.  Aaron Moss to call pt  - please repeat CBC at his earliest convenience to rule out lab error, and consider if need for hematology referral

## 2014-11-27 NOTE — Assessment & Plan Note (Signed)

## 2014-11-27 NOTE — Assessment & Plan Note (Signed)
Mild uncontrolled, to restart meds, o/wstable overall by history and exam, recent data reviewed with pt, and pt to continue medical treatment as before,  to f/u any worsening symptoms or concerns BP Readings from Last 3 Encounters:  11/27/14 154/92  11/05/13 151/63  09/29/13 157/71

## 2014-11-27 NOTE — Patient Instructions (Signed)
Please continue all other medications as before, and refills have been done if requested.  Please have the pharmacy call with any other refills you may need.  Please continue your efforts at being more active, low cholesterol diet, and weight control.  You are otherwise up to date with prevention measures today.  Please keep your appointments with your specialists as you may have planned  Please go to the LAB in the Basement (turn left off the elevator) for the tests to be done today  You will be contacted by phone if any changes need to be made immediately.  Otherwise, you will receive a letter about your results with an explanation, but please check with MyChart first.  Please return in 6 months, or sooner if needed 

## 2014-11-27 NOTE — Progress Notes (Signed)
Pre visit review using our clinic review tool, if applicable. No additional management support is needed unless otherwise documented below in the visit note. 

## 2014-11-27 NOTE — Telephone Encounter (Signed)
Critical Value - WBC 19.3

## 2014-11-27 NOTE — Progress Notes (Signed)
Subjective:    Patient ID: Aaron Moss, male    DOB: [birth year], 69 y.o.   MRN: [id number]  HPI  Here for wellness and f/u;  Overall doing ok;  Pt denies Chest pain, worsening SOB, DOE, wheezing, orthopnea, PND, worsening LE edema, palpitations, dizziness or syncope.  Pt denies neurological change such as new headache, facial or extremity weakness.  Pt denies polydipsia, polyuria, or low sugar symptoms. Pt states overall good compliance with treatment and medications, good tolerability, and has been trying to follow appropriate diet.  Pt denies worsening depressive symptoms, suicidal ideation or panic. No fever, night sweats, wt loss, loss of appetite, or other constitutional symptoms.  Pt states good ability with ADL's, has low fall risk, home safety reviewed and adequate, no other significant changes in hearing or vision, and only occasionally active with exercise. Unfort has been out of meds for over 1 wk.   Past Medical History  Diagnosis Date  . Hypertension   . Hyperlipidemia   . History of colon cancer   . Left knee DJD   . Diverticulosis of colon   . 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 Colon cancer 2000  . History of chemotherapy 2000    colon cancer  . History of radiation therapy 08/06/13- 10/03/13    prostate 7800 cGy 40 sessions, seminal vesicles 5600 cGy 40 sessions   Past Surgical History  Procedure Laterality Date  . Colon surgery  2000    colon cancer  . Knee arthroscopy  2007    LT- [facility]  . Colonoscopy    . Eus      pancreatic cyst  . Prostate biopsy  05/23/13    gleason 7, volume 11.5 ml    reports that he quit smoking about 26 years ago. His smoking use included Cigarettes. He has a 22 pack-year smoking history. He has never used smokeless tobacco. He reports that he drinks alcohol. He reports that he does not use illicit drugs. family history includes Cancer in his brother, maternal grandmother, and mother; Colon cancer in his sister;  Diabetes in his brother. Allergies  Allergen Reactions  . Pregabalin     REACTION: felt bad   Current Outpatient Prescriptions on File Prior to Visit  Medication Sig Dispense Refill  . aspirin EC 81 MG tablet Take 81 mg by mouth daily.      . diclofenac (VOLTAREN) 75 MG EC tablet TAKE ONE TABLET BY MOUTH TWICE DAILY AS NEEDED FOR PAIN 60 tablet 5  . traMADol (ULTRAM) 50 MG tablet TAKE ONE TABLET BY MOUTH EVERY 6 HOURS AS NEEDED FOR PAIN 120 tablet 0  . vardenafil (LEVITRA) 20 MG tablet Take 1 tablet (20 mg total) by mouth as needed for erectile dysfunction. 5 tablet 11   No current facility-administered medications on file prior to visit.   Review of Systems Constitutional: Negative for increased diaphoresis, other activity, appetite or siginficant weight change other than noted HENT: Negative for worsening hearing loss, ear pain, facial swelling, mouth sores and neck stiffness.   Eyes: Negative for other worsening pain, redness or visual disturbance.  Respiratory: Negative for shortness of breath and wheezing  Cardiovascular: Negative for chest pain and palpitations.  Gastrointestinal: Negative for diarrhea, blood in stool, abdominal distention or other pain Genitourinary: Negative for hematuria, flank pain or change in urine volume.  Musculoskeletal: Negative for myalgias or other joint complaints.  Skin: Negative for color change and wound or drainage.  Neurological: Negative for syncope and numbness. other than noted Hematological: Negative for adenopathy. or other swelling Psychiatric/Behavioral: Negative for hallucinations, SI, self-injury, decreased concentration or other worsening agitation.      Objective:   Physical Exam BP 154/92 mmHg  Pulse 54  Temp(Src) 98.9 F (37.2 C) (Oral)  Ht 5\' 6"  (1.676 m)  Wt 272 lb (123.378 kg)  BMI 43.92 kg/m2  SpO2 98% VS noted,  Constitutional: Pt is oriented to person, place, and time. Appears well-developed and well-nourished, in no  significant distress Head: Normocephalic and atraumatic.  Right Ear: External ear normal.  Left Ear: External ear normal.  Nose: Nose normal.  Mouth/Throat: Oropharynx is clear and moist.  Eyes: Conjunctivae and EOM are normal. Pupils are equal, round, and reactive to light.  Neck: Normal range of motion. Neck supple. No JVD present. No tracheal deviation present or significant neck LA or mass Cardiovascular: Normal rate, regular rhythm, normal heart sounds and intact distal pulses.   Pulmonary/Chest: Effort normal and breath sounds without rales or wheezing  Abdominal: Soft. Bowel sounds are normal. NT. No HSM  Musculoskeletal: Normal range of motion. Exhibits no edema.  Lymphadenopathy:  Has no cervical adenopathy.  Neurological: Pt is alert and oriented to person, place, and time. Pt has normal reflexes. No cranial nerve deficit. Motor grossly intact Skin: Skin is warm and dry. No rash noted.  Psychiatric:  Has normal mood and affect. Behavior is normal.      Assessment & Plan:

## 2014-12-01 ENCOUNTER — Encounter: Payer: Self-pay | Admitting: Internal Medicine

## 2014-12-01 ENCOUNTER — Other Ambulatory Visit: Payer: Self-pay | Admitting: Internal Medicine

## 2014-12-01 DIAGNOSIS — D72829 Elevated white blood cell count, unspecified: Secondary | ICD-10-CM

## 2014-12-01 NOTE — Telephone Encounter (Signed)
See result note.  

## 2014-12-03 ENCOUNTER — Other Ambulatory Visit (INDEPENDENT_AMBULATORY_CARE_PROVIDER_SITE_OTHER): Payer: Medicare Other

## 2014-12-03 ENCOUNTER — Encounter: Payer: Self-pay | Admitting: Internal Medicine

## 2014-12-03 DIAGNOSIS — D72829 Elevated white blood cell count, unspecified: Secondary | ICD-10-CM | POA: Diagnosis not present

## 2014-12-03 LAB — CBC WITH DIFFERENTIAL/PLATELET
BASOS ABS: 0 10*3/uL (ref 0.0–0.1)
Basophils Relative: 0.3 % (ref 0.0–3.0)
EOS ABS: 0.2 10*3/uL (ref 0.0–0.7)
Eosinophils Relative: 2 % (ref 0.0–5.0)
HEMATOCRIT: 41.8 % (ref 39.0–52.0)
Hemoglobin: 13.6 g/dL (ref 13.0–17.0)
LYMPHS PCT: 17 % (ref 12.0–46.0)
Lymphs Abs: 1.4 10*3/uL (ref 0.7–4.0)
MCHC: 32.5 g/dL (ref 30.0–36.0)
MCV: 79.7 fl (ref 78.0–100.0)
MONO ABS: 0.5 10*3/uL (ref 0.1–1.0)
Monocytes Relative: 5.9 % (ref 3.0–12.0)
NEUTROS ABS: 6.1 10*3/uL (ref 1.4–7.7)
NEUTROS PCT: 74.8 % (ref 43.0–77.0)
PLATELETS: 256 10*3/uL (ref 150.0–400.0)
RBC: 5.25 Mil/uL (ref 4.22–5.81)
RDW: 17 % — ABNORMAL HIGH (ref 11.5–15.5)
WBC: 8.1 10*3/uL (ref 4.0–10.5)

## 2014-12-19 ENCOUNTER — Other Ambulatory Visit: Payer: Self-pay | Admitting: Internal Medicine

## 2015-01-01 ENCOUNTER — Other Ambulatory Visit: Payer: Self-pay | Admitting: Internal Medicine

## 2015-01-01 NOTE — Telephone Encounter (Signed)
Rx faxed to pharmacy  

## 2015-01-01 NOTE — Telephone Encounter (Signed)
Done hardcopy to [facility]  

## 2015-02-05 ENCOUNTER — Ambulatory Visit (INDEPENDENT_AMBULATORY_CARE_PROVIDER_SITE_OTHER): Payer: Medicare Other

## 2015-02-05 VITALS — BP 142/70 | Temp 98.4°F | Ht 66.0 in | Wt 265.0 lb

## 2015-02-05 DIAGNOSIS — Z0000 Encounter for general adult medical examination without abnormal findings: Secondary | ICD-10-CM

## 2015-02-05 NOTE — Progress Notes (Addendum)
Subjective:   Aaron Moss is a 69 y.o. male who presents for Medicare Annual/Subsequent preventive examination.  Review of Systems:   Cardiac Risk Factors include: advanced age (>46men, >30 women);dyslipidemia;hypertension;male gender;obesity (BMI >30kg/m2) HRA assessment completed during visit; [place] The Patient was informed that this wellness visit is to identify risk and educate on how to reduce risk for increase disease through lifestyle changes.   ROS deferred to CPE exam with physician  Psychosocial; Kids are in home (1 and 5)  Spouse is still working  Has 4 children; 8 grandchildren  Medical issues HTN; controlled with medication Mag neoplasm of the prostate; July 2014; no treatment at present  Zoster- post hepatic neuropathy; no pain at present  Hyperlipidemia / states he gets his labs and understands his numbers   BMI: 42.8 feels comfortable; quit smoking and started gaining weight  Weight 240 and now 265; Was up to 300 when he retired Diet; Breakfast; never got in the habit of eating breakfast Lunch eats about 1 pm; sandwich; Supper; full course meal; meat, vegetables / mostly baked;   Exercise; used to go to the gym; out of habit  Weight training and cardio; did go 3 times a week; walking 4 miles a day after retirement Now may  walk x 14 min and do weight training  May try to go back;  How did you  feel after  he exercised?  Benefits; feel a lot better and can move around a lot better;  Motivation 8 (1-10) Gained weight when he stopped  Asked what he feels is in the way as he wants to go and then does not go? States family leans calls him to do something for them and this interrupts his intent;  Discussed ways he can resolve this; states he can not answer the phone in the am; discussed telling his family that he will be unavailable 3 days a week as he voices the significance of exercise and this assisting with energy levels and ROM in knee and may help with  halting weight gain and promote weight loss   SAFETY one level  Safety reviewed for the home; including removal of clutter; clear paths through the home, eliminating clutter, railing as needed; bathroom safety discussed and shower is free standing;  community safety; smoke detectors and firearms safety protection;  Driving accidents (No)  Sun protection/ mostly wear wide brimmed had Stressors; grand kids;   Medication review/ New meds  Fall assessment /no Gait assessment/ slow but gets up and down with marginal effort from chair  Mobilization and Functional losses in the last year/ good Sleep patterns/ sleeps good    Urinary or fecal incontinence reviewed/ none noted   Counseling: Hep c/will have when he see dr Jenny Reichmann Colonoscopy; 11/07/2012 a polyp as removed; 10/2017  Hx colon cancer; around 2000; had surgery; had chemo EKG: 09/04/2012 Hearing: worked in [employer] with loud noise  Ophthalmology exam; been awhile; in process   Immunizations Due  Tetanus due 03/2013;  Flu/ states the last time he came in he had a flu shot;    Current Care Team reviewed and updated Still go to Urologist Dr. Diona Fanti, [place] for knee / left knee / shot q 3 to 4 months      Objective:    Vitals: BP 142/70 mmHg  Temp(Src) 98.4 F (36.9 C)  Ht $R'5\' 6"'Wa$  (1.676 m)  Wt 265 lb (120.203 kg)  BMI 42.79 kg/m2  Tobacco History  Smoking status  .  Former Smoker -- 1.00 packs/day for 22 years  . Types: Cigarettes  . Quit date: 03/20/1988  Smokeless tobacco  . Never Used     Counseling given: Yes   Past Medical History  Diagnosis Date  . Hypertension   . Hyperlipidemia   . History of colon cancer   . Left knee DJD   . Diverticulosis of colon   . Prostate cancer ([place]) 05/23/13    gleason 3+4=7, volume 11.5 ml  . Colon cancer ([place]) 2000  . History of chemotherapy 2000    colon cancer  . History of radiation therapy 08/06/13- 10/03/13    prostate 7800 cGy 40 sessions, seminal  vesicles 5600 cGy 40 sessions   Past Surgical History  Procedure Laterality Date  . Colon surgery  2000    colon cancer  . Knee arthroscopy  2007    LT- [facility]  . Colonoscopy    . Eus      pancreatic cyst  . Prostate biopsy  05/23/13    gleason 7, volume 11.5 ml   Family History  Problem Relation Age of Onset  . Cancer Mother     Liver  . Cancer Brother     Lung  . Cancer Maternal Grandmother     Breast  . Diabetes Brother   . Colon cancer Sister    History  Sexual Activity  . Sexual Activity: Not on file    Outpatient Encounter Prescriptions as of 02/05/2015  Medication Sig  . amLODipine (NORVASC) 10 MG tablet Take 1 tablet (10 mg total) by mouth daily.  Marland Kitchen aspirin EC 81 MG tablet Take 81 mg by mouth daily.    Marland Kitchen atorvastatin (LIPITOR) 40 MG tablet Take 1 tablet (40 mg total) by mouth daily.  . benazepril (LOTENSIN) 40 MG tablet Take 1 tablet (40 mg total) by mouth daily.  . diclofenac (VOLTAREN) 75 MG EC tablet TAKE ONE TABLET BY MOUTH TWICE DAILY AS NEEDED FOR PAIN  . hydrochlorothiazide (HYDRODIURIL) 25 MG tablet Take 1 tablet (25 mg total) by mouth daily.  . potassium chloride (K-DUR) 10 MEQ tablet Take 1 tablet (10 mEq total) by mouth daily.  . traMADol (ULTRAM) 50 MG tablet TAKE ONE TABLET BY MOUTH EVERY 6 HOURS AS NEEDED FOR PAIN  . KLOR-CON M10 10 MEQ tablet TAKE ONE TABLET BY MOUTH ONCE DAILY (Patient not taking: Reported on 02/05/2015)  . vardenafil (LEVITRA) 20 MG tablet Take 1 tablet (20 mg total) by mouth as needed for erectile dysfunction.   No facility-administered encounter medications on file as of 02/05/2015.    Activities of Daily Living In your present state of health, do you have any difficulty performing the following activities: 02/05/2015  Hearing? N  Vision? N  Difficulty concentrating or making decisions? N  Walking or climbing stairs? N  Dressing or bathing? N  Doing errands, shopping? N  Preparing Food and eating ? N  Using the  Toilet? N  In the past six months, have you accidently leaked urine? N  Do you have problems with loss of bowel control? N  Managing your Medications? N  Managing your Finances? N  Housekeeping or managing your Housekeeping? N    Patient Care Team: Biagio Borg, MD as PCP - General   Assessment:    Assessment   Patient presents for yearly preventative medicine examination. Medicare questionnaire screening were completed, i.e. Functional; fall risk; depression, memory loss and hearing. Does have hearing issues and recognizes the need for a screen  All immunizations and health maintenance protocols were reviewed with the patient and needed orders were placed./ had flu in Sept; Also noted Tetanus; Will try to take at his apt in March or earlier   Education provided for laboratory screens;  Will have hep c drawn   Medication reconciliation, past medical history, social history, problem list and allergies were reviewed in detail with the patient   Goals were established with regard to weight loss, exercise and the patient was able to make a plan to overcome barriers while in the office today  End of life planning was discussed with face to face x 20 minutes. Agreed to review the Advanced directive at the hospital; Reviewed HCPOA and Living Will by line item and given examples.  Discussed designation of HCPOA and that he can revoke this and how. Educated on resources for completion or questions related to life support; tube feeding or other;  The patient informed that 2 witnesses (not heirs or physicians practice) needed to co sign his signature as well as a Patent examiner. Did spend time discussing life support; ICU; ventilation; and answered the patient's questions; Asked that he bring a copy to the office once  Complete or discuss with PCP when he returns.    Exercise Activities and Dietary recommendations Current Exercise Habits:: Structured exercise class, Type of exercise: strength  training/weights;walking, Time (Minutes): 45, Frequency (Times/Week): 3, Weekly Exercise (Minutes/Week): 135, Intensity: Moderate  Goals    . Exercise 150 minutes per week (moderate activity)     Go back 3 days a week for treadmill; stationary bike helps ROM;  Motivated to take back control of your time; will let the children know that 3 days a week, you will not be available  Feels better when you exercise with more energy      Fall Risk Fall Risk  02/05/2015 11/27/2014 03/05/2013  Falls in the past year? No No Yes  Number falls in past yr: - - 1  Injury with Fall? - - Yes   Depression Screen PHQ 2/9 Scores 02/05/2015 11/27/2014 03/05/2013  PHQ - 2 Score 0 0 0    Cognitive Testing MMSE - Mini Mental State Exam 02/05/2015  Not completed: (No Data)   Ad8 score is 0; no issues to date; affect appropriate today and participating in wellness visit    Immunization History  Administered Date(s) Administered  . Influenza Whole 04/30/2009, 04/25/2010  . Influenza,inj,Quad PF,36+ Mos 03/05/2013, 04/27/2014  . Influenza-Unspecified 11/19/2014  . Pneumococcal Conjugate-13 03/26/2013  . Pneumococcal Polysaccharide-23 06/08/2009  . Td 03/21/2003  . Zoster 02/19/2006   Screening Tests Health Maintenance  Topic Date Due  . Hepatitis C Screening  [birth year]  . TETANUS/TDAP  03/20/2013  . INFLUENZA VACCINE  10/19/2014  . COLONOSCOPY  11/07/2017  . ZOSTAVAX  Completed  . PNA vac Low Risk Adult  Completed      Plan:     Coached to better care for himself and to get his needs met Plans to go back to the gym; set boundaries with family and take time for himself to do the things that make him feel better  In process of scheduling a vision apt  Will have hep c when he comes back for labs  Also will consider Tetanus and try to take at next apt or sooner.  During the course of the visit the patient was educated and counseled about the following appropriate screening and preventive  services:   Vaccines to include Pneumoccal, Influenza, Hepatitis B, Td,  Zostavax, HCV/ as stated above  Electrocardiogram/ 09/04/2012  Cardiovascular Disease/ HTN under control; risk factors discussed including weight; lack of exercise and stress   Colorectal cancer screening /10/2017 hx of colon cancer approx 2000 per the patient; surgical intervention with chemo and doing well now  Diabetes screening/ n/a  Prostate Cancer Screening/ Hx cancer and currently not undergoing tx  Glaucoma screening encouraged to get eye exam; States wife is in process of scheduling  Nutrition counseling / weight discussed but would like to get back to the gym, taking care of self and have more energy prior to discussing weight reduction; States he did weigh 300 and is not "uncomfortable" with his weight; Discussed BMI   Smoking cessation counseling/no longer smokes; quit > 15 years ago  Patient Instructions (the written plan) was given to the patient.    Wynetta Fines, RN  02/05/2015  Medical screening examination/treatment/procedure(s) were performed by non-physician practitioner and as supervising physician I was immediately available for consultation/collaboration. I agree with above. Cathlean Cower, MD

## 2015-02-05 NOTE — Patient Instructions (Addendum)
[name] , Thank you for taking time to come for your Medicare Wellness Visit. I appreciate your ongoing commitment to your health goals. Please review the following plan we discussed and let me know if I can assist you in the future.   In process of making a vision apt Will have hep c in March when he sees Dr Jenny Reichmann   These are the goals we discussed: Goals    . Exercise 150 minutes per week (moderate activity)     Go back 3 days a week for treadmill; stationary bike helps ROM;  Motivated to take back control of your time; will let the children know that 3 days a week, you will not be available  Feels better when you exercise with more energy       This is a list of the screening recommended for you and due dates:  Health Maintenance  Topic Date Due  .  Hepatitis C: One time screening is recommended by Center for Disease Control  (CDC) for  adults born from [birth year] through 1967.   07-17-1945  . Tetanus Vaccine  03/20/2013  . Flu Shot  10/19/2014  . Colon Cancer Screening  11/07/2017  . Shingles Vaccine  Completed  . Pneumonia vaccines  Completed     Fall Prevention in the Home  Falls can cause injuries. They can happen to people of all ages. There are many things you can do to make your home safe and to help prevent falls.  WHAT CAN I DO ON THE OUTSIDE OF MY HOME?  Regularly fix the edges of walkways and driveways and fix any cracks.  Remove anything that might make you trip as you walk through a door, such as a raised step or threshold.  Trim any bushes or trees on the path to your home.  Use bright outdoor lighting.  Clear any walking paths of anything that might make someone trip, such as rocks or tools.  Regularly check to see if handrails are loose or broken. Make sure that both sides of any steps have handrails.  Any raised decks and porches should have guardrails on the edges.  Have any leaves, snow, or ice cleared regularly.  Use sand or salt on walking paths  during winter.  Clean up any spills in your garage right away. This includes oil or grease spills. WHAT CAN I DO IN THE BATHROOM?   Use night lights.  Install grab bars by the toilet and in the tub and shower. Do not use towel bars as grab bars.  Use non-skid mats or decals in the tub or shower.  If you need to sit down in the shower, use a plastic, non-slip stool.  Keep the floor dry. Clean up any water that spills on the floor as soon as it happens.  Remove soap buildup in the tub or shower regularly.  Attach bath mats securely with double-sided non-slip rug tape.  Do not have throw rugs and other things on the floor that can make you trip. WHAT CAN I DO IN THE BEDROOM?  Use night lights.  Make sure that you have a light by your bed that is easy to reach.  Do not use any sheets or blankets that are too big for your bed. They should not hang down onto the floor.  Have a firm chair that has side arms. You can use this for support while you get dressed.  Do not have throw rugs and other things on the floor  that can make you trip. WHAT CAN I DO IN THE KITCHEN?  Clean up any spills right away.  Avoid walking on wet floors.  Keep items that you use a lot in easy-to-reach places.  If you need to reach something above you, use a strong step stool that has a grab bar.  Keep electrical cords out of the way.  Do not use floor polish or wax that makes floors slippery. If you must use wax, use non-skid floor wax.  Do not have throw rugs and other things on the floor that can make you trip. WHAT CAN I DO WITH MY STAIRS?  Do not leave any items on the stairs.  Make sure that there are handrails on both sides of the stairs and use them. Fix handrails that are broken or loose. Make sure that handrails are as long as the stairways.  Check any carpeting to make sure that it is firmly attached to the stairs. Fix any carpet that is loose or worn.  Avoid having throw rugs at the top  or bottom of the stairs. If you do have throw rugs, attach them to the floor with carpet tape.  Make sure that you have a light switch at the top of the stairs and the bottom of the stairs. If you do not have them, ask someone to add them for you. WHAT ELSE CAN I DO TO HELP PREVENT FALLS?  Wear shoes that:  Do not have high heels.  Have rubber bottoms.  Are comfortable and fit you well.  Are closed at the toe. Do not wear sandals.  If you use a stepladder:  Make sure that it is fully opened. Do not climb a closed stepladder.  Make sure that both sides of the stepladder are locked into place.  Ask someone to hold it for you, if possible.  Clearly mark and make sure that you can see:  Any grab bars or handrails.  First and last steps.  Where the edge of each step is.  Use tools that help you move around (mobility aids) if they are needed. These include:  Canes.  Walkers.  Scooters.  Crutches.  Turn on the lights when you go into a dark area. Replace any light bulbs as soon as they burn out.  Set up your furniture so you have a clear path. Avoid moving your furniture around.  If any of your floors are uneven, fix them.  If there are any pets around you, be aware of where they are.  Review your medicines with your doctor. Some medicines can make you feel dizzy. This can increase your chance of falling. Ask your doctor what other things that you can do to help prevent falls.   This information is not intended to replace advice given to you by your health care provider. Make sure you discuss any questions you have with your health care provider.   Document Released: 12/31/2008 Document Revised: 07/21/2014 Document Reviewed: 04/10/2014 Elsevier Interactive Patient Education 2016 Arial Maintenance, Male A healthy lifestyle and preventative care can promote health and wellness.  Maintain regular health, dental, and eye exams.  Eat a healthy diet.  Foods like vegetables, fruits, whole grains, low-fat dairy products, and lean protein foods contain the nutrients you need and are low in calories. Decrease your intake of foods high in solid fats, added sugars, and salt. Get information about a proper diet from your health care provider, if necessary.  Regular physical exercise is  one of the most important things you can do for your health. Most adults should get at least 150 minutes of moderate-intensity exercise (any activity that increases your heart rate and causes you to sweat) each week. In addition, most adults need muscle-strengthening exercises on 2 or more days a week.   Maintain a healthy weight. The body mass index (BMI) is a screening tool to identify possible weight problems. It provides an estimate of body fat based on height and weight. Your health care provider can find your BMI and can help you achieve or maintain a healthy weight. For males 20 years and older:  A BMI below 18.5 is considered underweight.  A BMI of 18.5 to 24.9 is normal.  A BMI of 25 to 29.9 is considered overweight.  A BMI of 30 and above is considered obese.  Maintain normal blood lipids and cholesterol by exercising and minimizing your intake of saturated fat. Eat a balanced diet with plenty of fruits and vegetables. Blood tests for lipids and cholesterol should begin at age 47 and be repeated every 5 years. If your lipid or cholesterol levels are high, you are over age 72, or you are at high risk for heart disease, you may need your cholesterol levels checked more frequently.Ongoing high lipid and cholesterol levels should be treated with medicines if diet and exercise are not working.  If you smoke, find out from your health care provider how to quit. If you do not use tobacco, do not start.  Lung cancer screening is recommended for adults aged 19-80 years who are at high risk for developing lung cancer because of a history of smoking. A yearly low-dose  CT scan of the lungs is recommended for people who have at least a 30-pack-year history of smoking and are current smokers or have quit within the past 15 years. A pack year of smoking is smoking an average of 1 pack of cigarettes a day for 1 year (for example, a 30-pack-year history of smoking could mean smoking 1 pack a day for 30 years or 2 packs a day for 15 years). Yearly screening should continue until the smoker has stopped smoking for at least 15 years. Yearly screening should be stopped for people who develop a health problem that would prevent them from having lung cancer treatment.  If you choose to drink alcohol, do not have more than 2 drinks per day. One drink is considered to be 12 oz (360 mL) of beer, 5 oz (150 mL) of wine, or 1.5 oz (45 mL) of liquor.  Avoid the use of street drugs. Do not share needles with anyone. Ask for help if you need support or instructions about stopping the use of drugs.  High blood pressure causes heart disease and increases the risk of stroke. High blood pressure is more likely to develop in:  People who have blood pressure in the end of the normal range (100-139/85-89 mm Hg).  People who are overweight or obese.  People who are African American.  If you are 64-30 years of age, have your blood pressure checked every 3-5 years. If you are 13 years of age or older, have your blood pressure checked every year. You should have your blood pressure measured twice--once when you are at a hospital or clinic, and once when you are not at a hospital or clinic. Record the average of the two measurements. To check your blood pressure when you are not at a hospital or clinic, you can  use:  An automated blood pressure machine at a pharmacy.  A home blood pressure monitor.  If you are 86-63 years old, ask your health care provider if you should take aspirin to prevent heart disease.  Diabetes screening involves taking a blood sample to check your fasting blood  sugar level. This should be done once every 3 years after age 25 if you are at a normal weight and without risk factors for diabetes. Testing should be considered at a younger age or be carried out more frequently if you are overweight and have at least 1 risk factor for diabetes.  Colorectal cancer can be detected and often prevented. Most routine colorectal cancer screening begins at the age of 69 and continues through age 48. However, your health care provider may recommend screening at an earlier age if you have risk factors for colon cancer. On a yearly basis, your health care provider may provide home test kits to check for hidden blood in the stool. A small camera at the end of a tube may be used to directly examine the colon (sigmoidoscopy or colonoscopy) to detect the earliest forms of colorectal cancer. Talk to your health care provider about this at age 38 when routine screening begins. A direct exam of the colon should be repeated every 5-10 years through age 26, unless early forms of precancerous polyps or small growths are found.  People who are at an increased risk for hepatitis B should be screened for this virus. You are considered at high risk for hepatitis B if:  You were born in a country where hepatitis B occurs often. Talk with your health care provider about which countries are considered high risk.  Your parents were born in a high-risk country and you have not received a shot to protect against hepatitis B (hepatitis B vaccine).  You have HIV or AIDS.  You use needles to inject street drugs.  You live with, or have sex with, someone who has hepatitis B.  You are a man who has sex with other men (MSM).  You get hemodialysis treatment.  You take certain medicines for conditions like cancer, organ transplantation, and autoimmune conditions.  Hepatitis C blood testing is recommended for all people born from [birth year] through 1965 and any individual with known risk factors for  hepatitis C.  Healthy men should no longer receive prostate-specific antigen (PSA) blood tests as part of routine cancer screening. Talk to your health care provider about prostate cancer screening.  Testicular cancer screening is not recommended for adolescents or adult males who have no symptoms. Screening includes self-exam, a health care provider exam, and other screening tests. Consult with your health care provider about any symptoms you have or any concerns you have about testicular cancer.  Practice safe sex. Use condoms and avoid high-risk sexual practices to reduce the spread of sexually transmitted infections (STIs).  You should be screened for STIs, including gonorrhea and chlamydia if:  You are sexually active and are younger than 24 years.  You are older than 24 years, and your health care provider tells you that you are at risk for this type of infection.  Your sexual activity has changed since you were last screened, and you are at an increased risk for chlamydia or gonorrhea. Ask your health care provider if you are at risk.  If you are at risk of being infected with HIV, it is recommended that you take a prescription medicine daily to prevent HIV infection. This is  called pre-exposure prophylaxis (PrEP). You are considered at risk if:  You are a man who has sex with other men (MSM).  You are a heterosexual man who is sexually active with multiple partners.  You take drugs by injection.  You are sexually active with a partner who has HIV.  Talk with your health care provider about whether you are at high risk of being infected with HIV. If you choose to begin PrEP, you should first be tested for HIV. You should then be tested every 3 months for as long as you are taking PrEP.  Use sunscreen. Apply sunscreen liberally and repeatedly throughout the day. You should seek shade when your shadow is shorter than you. Protect yourself by wearing long sleeves, pants, a  wide-brimmed hat, and sunglasses year round whenever you are outdoors.  Tell your health care provider of new moles or changes in moles, especially if there is a change in shape or color. Also, tell your health care provider if a mole is larger than the size of a pencil eraser.  A one-time screening for abdominal aortic aneurysm (AAA) and surgical repair of large AAAs by ultrasound is recommended for men aged 52-75 years who are current or former smokers.  Stay current with your vaccines (immunizations).   This information is not intended to replace advice given to you by your health care provider. Make sure you discuss any questions you have with your health care provider.   Document Released: 09/02/2007 Document Revised: 03/27/2014 Document Reviewed: 08/01/2010 Elsevier Interactive Patient Education 2016 [employer].  Hearing Loss Hearing loss is a partial or total loss of the ability to hear. This can be temporary or permanent, and it can happen in one or both ears. Hearing loss may be referred to as deafness. Medical care is necessary to treat hearing loss properly and to prevent the condition from getting worse. Your hearing may partially or completely come back, depending on what caused your hearing loss and how severe it is. In some cases, hearing loss is permanent. CAUSES Common causes of hearing loss include:   Too much wax in the ear canal.   Infection of the ear canal or middle ear.   Fluid in the middle ear.   Injury to the ear or surrounding area.   An object stuck in the ear.   Prolonged exposure to loud sounds, such as music.  Less common causes of hearing loss include:   Tumors in the ear.   Viral or bacterial infections, such as meningitis.   A hole in the eardrum (perforated eardrum).  Problems with the hearing nerve that sends signals between the brain and the ear.  Certain medicines.  SYMPTOMS  Symptoms of this condition may  include:  Difficulty telling the difference between sounds.  Difficulty following a conversation when there is background noise.  Lack of response to sounds in your environment. This may be most noticeable when you do not respond to startling sounds.  Needing to turn up the volume on the television, radio, etc.  Ringing in the ears.  Dizziness.  Pain in the ears. DIAGNOSIS This condition is diagnosed based on a physical exam and a hearing test (audiometry). The audiometry test will be performed by a hearing specialist (audiologist). You may also be referred to an ear, nose, and throat (ENT) specialist (otolaryngologist).  TREATMENT Treatment for recent onset of hearing loss may include:   Ear wax removal.   Being prescribed medicines to prevent infection (antibiotics).  Being prescribed medicines to reduce inflammation (corticosteroids).  HOME CARE INSTRUCTIONS  If you were prescribed an antibiotic medicine, take it as told by your health care provider. Do not stop taking the antibiotic even if you start to feel better.  Take over-the-counter and prescription medicines only as told by your health care provider.  Avoid loud noises.   Return to your normal activities as told by your health care provider. Ask your health care provider what activities are safe for you.  Keep all follow-up visits as told by your health care provider. This is important. SEEK MEDICAL CARE IF:   You feel dizzy.   You develop new symptoms.   You vomit or feel nauseous.   You have a fever.  SEEK IMMEDIATE MEDICAL CARE IF:  You develop sudden changes in your vision.   You have severe ear pain.   You have new or increased weakness.  You have a severe headache.   This information is not intended to replace advice given to you by your health care provider. Make sure you discuss any questions you have with your health care provider.   Document Released: 03/06/2005 Document  Revised: 11/25/2014 Document Reviewed: 07/22/2014 Elsevier Interactive Patient Education 2016 [place] you for enrolling in [facility]. Please follow the instructions below to securely access your online medical record. MyChart allows you to send messages to your doctor, view your test results, manage appointments, and more.   How Do I Sign Up? 1. In your Internet browser, go to [employer] and enter [email or web address]. 2. Click on the Sign Up Now link in the Sign In box. You will see the New Member Sign Up page. 3. Enter your MyChart Access Code exactly as it appears below. You will not need to use this code after you've completed the sign-up process. If you do not sign up before the expiration date, you must request a new code.  MyChart Access Code: [email or web address] Expires: 04/04/2015 11:46 AM  4. Enter your Social Security Number ([id number]) and Date of Birth (mm/dd/yyyy) as indicated and click Submit. You will be taken to the next sign-up page. 5. Create a MyChart ID. This will be your MyChart login ID and cannot be changed, so think of one that is secure and easy to remember. 6. Create a MyChart password. You can change your password at any time. 7. Enter your Password Reset Question and Answer. This can be used at a later time if you forget your password.  8. Enter your e-mail address. You will receive e-mail notification when new information is available in [place]. 9. Click Sign Up. You can now view your medical record.   Additional Information Remember, MyChart is NOT to be used for urgent needs. For medical emergencies, dial 911.

## 2015-03-18 ENCOUNTER — Ambulatory Visit (INDEPENDENT_AMBULATORY_CARE_PROVIDER_SITE_OTHER): Payer: Medicare Other | Admitting: Internal Medicine

## 2015-03-18 ENCOUNTER — Encounter: Payer: Self-pay | Admitting: Internal Medicine

## 2015-03-18 VITALS — BP 140/68 | HR 78 | Temp 98.0°F | Ht 66.0 in | Wt 266.0 lb

## 2015-03-18 DIAGNOSIS — R05 Cough: Secondary | ICD-10-CM | POA: Diagnosis not present

## 2015-03-18 DIAGNOSIS — I10 Essential (primary) hypertension: Secondary | ICD-10-CM

## 2015-03-18 DIAGNOSIS — R059 Cough, unspecified: Secondary | ICD-10-CM

## 2015-03-18 MED ORDER — HYDROCODONE-HOMATROPINE 5-1.5 MG/5ML PO SYRP: 5.0000 mL | ORAL_SOLUTION | Freq: Four times a day (QID) | ORAL | Status: DC | PRN

## 2015-03-18 MED ORDER — LEVOFLOXACIN 250 MG PO TABS: 250.0000 mg | ORAL_TABLET | Freq: Every day | ORAL | Status: DC

## 2015-03-18 NOTE — Patient Instructions (Signed)
Please take all new medication as prescribed - the antibiotic, and cough medicine if needed  Please continue all other medications as before, and refills have been done if requested.  Please have the pharmacy call with any other refills you may need.  Please keep your appointments with your specialists as you may have planned     

## 2015-03-18 NOTE — Progress Notes (Signed)
Subjective:    Patient ID: Aaron Moss, male    DOB: [birth year], 69 y.o.   MRN: [id number]  HPI   Here with acute onset mild to mod 2-3 days ST, HA, general weakness and malaise, with prod cough greenish sputum, but Pt denies chest pain, increased sob or doe, wheezing, orthopnea, PND, increased LE swelling, palpitations, dizziness or syncope.  Pt denies new neurological symptoms such as new headache, or facial or extremity weakness or numbness   Pt denies polydipsia, polyuria Past Medical History  Diagnosis Date  . Hypertension   . Hyperlipidemia   . History of colon cancer   . Left knee DJD   . Diverticulosis of colon   . Prostate cancer ([place]) 05/23/13    gleason 3+4=7, volume 11.5 ml  . Colon cancer ([place]) 2000  . History of chemotherapy 2000    colon cancer  . History of radiation therapy 08/06/13- 10/03/13    prostate 7800 cGy 40 sessions, seminal vesicles 5600 cGy 40 sessions   Past Surgical History  Procedure Laterality Date  . Colon surgery  2000    colon cancer  . Knee arthroscopy  2007    LT- [facility]  . Colonoscopy    . Eus      pancreatic cyst  . Prostate biopsy  05/23/13    gleason 7, volume 11.5 ml    reports that he quit smoking about 27 years ago. His smoking use included Cigarettes. He has a 22 pack-year smoking history. He has never used smokeless tobacco. He reports that he drinks alcohol. He reports that he does not use illicit drugs. family history includes Cancer in his brother, maternal grandmother, and mother; Colon cancer in his sister; Diabetes in his brother. Allergies  Allergen Reactions  . Pregabalin     REACTION: felt bad   Current Outpatient Prescriptions on File Prior to Visit  Medication Sig Dispense Refill  . amLODipine (NORVASC) 10 MG tablet Take 1 tablet (10 mg total) by mouth daily. 90 tablet 3  . aspirin EC 81 MG tablet Take 81 mg by mouth daily.      Marland Kitchen atorvastatin (LIPITOR) 40 MG tablet Take 1 tablet (40 mg total) by mouth daily. 90  tablet 3  . benazepril (LOTENSIN) 40 MG tablet Take 1 tablet (40 mg total) by mouth daily. 90 tablet 3  . diclofenac (VOLTAREN) 75 MG EC tablet TAKE ONE TABLET BY MOUTH TWICE DAILY AS NEEDED FOR PAIN 60 tablet 5  . hydrochlorothiazide (HYDRODIURIL) 25 MG tablet Take 1 tablet (25 mg total) by mouth daily. 100 tablet 3  . potassium chloride (K-DUR) 10 MEQ tablet Take 1 tablet (10 mEq total) by mouth daily. 90 tablet 3  . traMADol (ULTRAM) 50 MG tablet TAKE ONE TABLET BY MOUTH EVERY 6 HOURS AS NEEDED FOR PAIN 120 tablet 2  . KLOR-CON M10 10 MEQ tablet TAKE ONE TABLET BY MOUTH ONCE DAILY (Patient not taking: Reported on 02/05/2015) 90 tablet 3  . vardenafil (LEVITRA) 20 MG tablet Take 1 tablet (20 mg total) by mouth as needed for erectile dysfunction. 5 tablet 11   No current facility-administered medications on file prior to visit.   Review of Systems  All otherwise neg per pt     Objective:   Physical Exam BP 140/68 mmHg  Pulse 78  Temp(Src) 98 F (36.7 C) (Oral)  Ht 5\' 6"  (1.676 m)  Wt 266 lb (120.657 kg)  BMI 42.95 kg/m2  SpO2 97% VS noted, mild ill  Constitutional: Pt appears in no significant distress HENT: Head: NCAT.  Right Ear: External ear normal.  Left Ear: External ear normal.  Bilat tm's with mild erythema.  Max sinus areas non tender.  Pharynx with mild erythema, no exudate Eyes: . Pupils are equal, round, and reactive to light. Conjunctivae and EOM are normal Neck: Normal range of motion. Neck supple.  Cardiovascular: Normal rate and regular rhythm.   Pulmonary/Chest: Effort normal and breath sounds some decreased without rales or wheezing.  Neurological: Pt is alert. Not confused , motor grossly intact Skin: Skin is warm. No rash, no LE edema Psychiatric: Pt behavior is normal. No agitation.     Assessment & Plan:

## 2015-03-19 DIAGNOSIS — R059 Cough, unspecified: Secondary | ICD-10-CM | POA: Insufficient documentation

## 2015-03-19 DIAGNOSIS — R05 Cough: Secondary | ICD-10-CM | POA: Insufficient documentation

## 2015-03-19 NOTE — Assessment & Plan Note (Signed)
Mild to mod, c/w bronchitis vs pna, declines cxr, for antibx course, cough med prn,  to f/u any worsening symptoms or concerns 

## 2015-03-19 NOTE — Assessment & Plan Note (Signed)
stable overall by history and exam, recent data reviewed with pt, and pt to continue medical treatment as before,  to f/u any worsening symptoms or concerns BP Readings from Last 3 Encounters:  03/18/15 140/68  02/05/15 142/70  11/27/14 154/92

## 2015-03-23 ENCOUNTER — Emergency Department (HOSPITAL_COMMUNITY): Payer: Medicare Other

## 2015-03-23 ENCOUNTER — Encounter (HOSPITAL_COMMUNITY): Payer: Self-pay | Admitting: Emergency Medicine

## 2015-03-23 ENCOUNTER — Inpatient Hospital Stay (HOSPITAL_COMMUNITY)
Admission: EM | Admit: 2015-03-23 | Discharge: 2015-03-25 | DRG: 871 | Disposition: A | Discharge status: Home / Self Care | Payer: Medicare Other | Attending: Internal Medicine | Admitting: Internal Medicine

## 2015-03-23 DIAGNOSIS — Z87891 Personal history of nicotine dependence: Secondary | ICD-10-CM

## 2015-03-23 DIAGNOSIS — K5730 Diverticulosis of large intestine without perforation or abscess without bleeding: Secondary | ICD-10-CM | POA: Diagnosis present

## 2015-03-23 DIAGNOSIS — Z833 Family history of diabetes mellitus: Secondary | ICD-10-CM | POA: Diagnosis not present

## 2015-03-23 DIAGNOSIS — M1712 Unilateral primary osteoarthritis, left knee: Secondary | ICD-10-CM | POA: Diagnosis present

## 2015-03-23 DIAGNOSIS — R6510 Systemic inflammatory response syndrome (SIRS) of non-infectious origin without acute organ dysfunction: Secondary | ICD-10-CM | POA: Diagnosis present

## 2015-03-23 DIAGNOSIS — R05 Cough: Secondary | ICD-10-CM

## 2015-03-23 DIAGNOSIS — I10 Essential (primary) hypertension: Secondary | ICD-10-CM

## 2015-03-23 DIAGNOSIS — A419 Sepsis, unspecified organism: Principal | ICD-10-CM

## 2015-03-23 DIAGNOSIS — J189 Pneumonia, unspecified organism: Secondary | ICD-10-CM | POA: Diagnosis not present

## 2015-03-23 DIAGNOSIS — Z9049 Acquired absence of other specified parts of digestive tract: Secondary | ICD-10-CM

## 2015-03-23 DIAGNOSIS — Z9221 Personal history of antineoplastic chemotherapy: Secondary | ICD-10-CM

## 2015-03-23 DIAGNOSIS — C61 Malignant neoplasm of prostate: Secondary | ICD-10-CM | POA: Diagnosis present

## 2015-03-23 DIAGNOSIS — Z800 Family history of malignant neoplasm of digestive organs: Secondary | ICD-10-CM | POA: Diagnosis not present

## 2015-03-23 DIAGNOSIS — R059 Cough, unspecified: Secondary | ICD-10-CM

## 2015-03-23 DIAGNOSIS — Z923 Personal history of irradiation: Secondary | ICD-10-CM | POA: Diagnosis not present

## 2015-03-23 DIAGNOSIS — Z85038 Personal history of other malignant neoplasm of large intestine: Secondary | ICD-10-CM

## 2015-03-23 DIAGNOSIS — Z789 Other specified health status: Secondary | ICD-10-CM

## 2015-03-23 DIAGNOSIS — Z8546 Personal history of malignant neoplasm of prostate: Secondary | ICD-10-CM | POA: Diagnosis not present

## 2015-03-23 DIAGNOSIS — E876 Hypokalemia: Secondary | ICD-10-CM

## 2015-03-23 DIAGNOSIS — E872 Acidosis, unspecified: Secondary | ICD-10-CM | POA: Diagnosis present

## 2015-03-23 DIAGNOSIS — E785 Hyperlipidemia, unspecified: Secondary | ICD-10-CM

## 2015-03-23 DIAGNOSIS — D72829 Elevated white blood cell count, unspecified: Secondary | ICD-10-CM | POA: Diagnosis present

## 2015-03-23 LAB — CBC WITH DIFFERENTIAL/PLATELET
Basophils Absolute: 0 10*3/uL (ref 0.0–0.1)
Basophils Relative: 0 %
EOS ABS: 0.2 10*3/uL (ref 0.0–0.7)
EOS PCT: 2 %
HCT: 44.2 % (ref 39.0–52.0)
Hemoglobin: 14.1 g/dL (ref 13.0–17.0)
LYMPHS ABS: 0.8 10*3/uL (ref 0.7–4.0)
LYMPHS PCT: 7 %
MCH: 26.1 pg (ref 26.0–34.0)
MCHC: 31.9 g/dL (ref 30.0–36.0)
MCV: 81.9 fL (ref 78.0–100.0)
MONOS PCT: 3 %
Monocytes Absolute: 0.3 10*3/uL (ref 0.1–1.0)
Neutro Abs: 10.8 10*3/uL — ABNORMAL HIGH (ref 1.7–7.7)
Neutrophils Relative %: 88 %
PLATELETS: 171 10*3/uL (ref 150–400)
RBC: 5.4 MIL/uL (ref 4.22–5.81)
RDW: 16.7 % — ABNORMAL HIGH (ref 11.5–15.5)
WBC: 12.1 10*3/uL — AB (ref 4.0–10.5)

## 2015-03-23 LAB — BASIC METABOLIC PANEL
Anion gap: 8 (ref 5–15)
BUN: 13 mg/dL (ref 6–20)
CHLORIDE: 106 mmol/L (ref 101–111)
CO2: 30 mmol/L (ref 22–32)
CREATININE: 1.08 mg/dL (ref 0.61–1.24)
Calcium: 8.7 mg/dL — ABNORMAL LOW (ref 8.9–10.3)
GFR calc Af Amer: >60 mL/min (ref >60)
GFR calc non Af Amer: >60 mL/min (ref >60)
GLUCOSE: 95 mg/dL (ref 65–99)
POTASSIUM: 3.3 mmol/L — AB (ref 3.5–5.1)
Sodium: 144 mmol/L (ref 135–145)

## 2015-03-23 LAB — URINALYSIS, ROUTINE W REFLEX MICROSCOPIC
BILIRUBIN URINE: NEGATIVE
GLUCOSE, UA: NEGATIVE mg/dL
Hgb urine dipstick: NEGATIVE
Ketones, ur: NEGATIVE mg/dL
LEUKOCYTES UA: NEGATIVE
NITRITE: NEGATIVE
PH: 7 (ref 5.0–8.0)
Protein, ur: NEGATIVE mg/dL
SPECIFIC GRAVITY, URINE: 1.021 (ref 1.005–1.030)

## 2015-03-23 LAB — I-STAT CG4 LACTIC ACID, ED
Lactic Acid, Venous: 2.04 mmol/L (ref 0.5–2.0)
Lactic Acid, Venous: 1.48 mmol/L (ref 0.5–2.0)

## 2015-03-23 MED ORDER — POTASSIUM CHLORIDE IN NACL 40-0.9 MEQ/L-% IV SOLN
INTRAVENOUS | Status: DC
  Administered 2015-03-23 – 2015-03-24 (×2): 100 mL/h via INTRAVENOUS
  Filled 2015-03-23 (×3): qty 1000

## 2015-03-23 MED ORDER — SODIUM CHLORIDE 0.9 % IV BOLUS (SEPSIS): 2000.0000 mL | Freq: Once | INTRAVENOUS | Status: DC

## 2015-03-23 MED ORDER — ENOXAPARIN SODIUM 40 MG/0.4ML ~~LOC~~ SOLN
40.0000 mg | SUBCUTANEOUS | Status: DC
  Administered 2015-03-23 – 2015-03-24 (×2): 40 mg via SUBCUTANEOUS
  Filled 2015-03-23 (×2): qty 0.4

## 2015-03-23 MED ORDER — ACETAMINOPHEN 500 MG PO TABS
1000.0000 mg | ORAL_TABLET | Freq: Once | ORAL | Status: AC
  Administered 2015-03-23: 1000 mg via ORAL
  Filled 2015-03-23: qty 2

## 2015-03-23 MED ORDER — SODIUM CHLORIDE 0.9 % IV BOLUS (SEPSIS)
1000.0000 mL | Freq: Once | INTRAVENOUS | Status: AC
  Administered 2015-03-23: 1000 mL via INTRAVENOUS

## 2015-03-23 MED ORDER — GUAIFENESIN-DM 100-10 MG/5ML PO SYRP: 5.0000 mL | ORAL_SOLUTION | ORAL | Status: DC | PRN

## 2015-03-23 MED ORDER — BENAZEPRIL HCL 10 MG PO TABS
40.0000 mg | ORAL_TABLET | Freq: Every day | ORAL | Status: DC
  Administered 2015-03-23 – 2015-03-25 (×3): 40 mg via ORAL
  Filled 2015-03-23 (×3): qty 4

## 2015-03-23 MED ORDER — ACETAMINOPHEN 325 MG PO TABS: 650.0000 mg | ORAL_TABLET | Freq: Four times a day (QID) | ORAL | Status: DC | PRN

## 2015-03-23 MED ORDER — ATORVASTATIN CALCIUM 40 MG PO TABS
40.0000 mg | ORAL_TABLET | Freq: Every day | ORAL | Status: DC
  Administered 2015-03-23 – 2015-03-25 (×3): 40 mg via ORAL
  Filled 2015-03-23 (×3): qty 1

## 2015-03-23 MED ORDER — CEFTRIAXONE SODIUM 1 G IJ SOLR
1.0000 g | INTRAMUSCULAR | Status: DC
  Administered 2015-03-23 – 2015-03-24 (×2): 1 g via INTRAVENOUS
  Filled 2015-03-23 (×3): qty 10

## 2015-03-23 MED ORDER — SODIUM CHLORIDE 0.9 % IV BOLUS (SEPSIS)
3000.0000 mL | Freq: Once | INTRAVENOUS | Status: AC
  Administered 2015-03-23: 3000 mL via INTRAVENOUS

## 2015-03-23 MED ORDER — GUAIFENESIN ER 600 MG PO TB12
1200.0000 mg | ORAL_TABLET | Freq: Two times a day (BID) | ORAL | Status: DC
  Administered 2015-03-23 – 2015-03-25 (×4): 1200 mg via ORAL
  Filled 2015-03-23 (×4): qty 2

## 2015-03-23 MED ORDER — POTASSIUM CHLORIDE CRYS ER 20 MEQ PO TBCR
40.0000 meq | EXTENDED_RELEASE_TABLET | Freq: Once | ORAL | Status: AC
  Administered 2015-03-23: 40 meq via ORAL
  Filled 2015-03-23: qty 2

## 2015-03-23 MED ORDER — IOHEXOL 350 MG/ML SOLN
100.0000 mL | Freq: Once | INTRAVENOUS | Status: AC | PRN
Start: 2015-03-23 — End: 2015-03-23
  Administered 2015-03-23: 100 mL via INTRAVENOUS

## 2015-03-23 MED ORDER — ALBUTEROL SULFATE (2.5 MG/3ML) 0.083% IN NEBU: 2.5000 mg | INHALATION_SOLUTION | RESPIRATORY_TRACT | Status: DC | PRN

## 2015-03-23 MED ORDER — AZITHROMYCIN 250 MG PO TABS
500.0000 mg | ORAL_TABLET | ORAL | Status: DC
  Administered 2015-03-23 – 2015-03-24 (×2): 500 mg via ORAL
  Filled 2015-03-23 (×2): qty 2

## 2015-03-23 MED ORDER — POTASSIUM CHLORIDE ER 10 MEQ PO TBCR
10.0000 meq | EXTENDED_RELEASE_TABLET | Freq: Every day | ORAL | Status: DC
  Administered 2015-03-23 – 2015-03-25 (×3): 10 meq via ORAL
  Filled 2015-03-23 (×5): qty 1

## 2015-03-23 MED ORDER — BENZONATATE 100 MG PO CAPS
100.0000 mg | ORAL_CAPSULE | Freq: Three times a day (TID) | ORAL | Status: DC
  Administered 2015-03-23 – 2015-03-25 (×6): 100 mg via ORAL
  Filled 2015-03-23 (×6): qty 1

## 2015-03-23 MED ORDER — AMLODIPINE BESYLATE 10 MG PO TABS
10.0000 mg | ORAL_TABLET | Freq: Every day | ORAL | Status: DC
  Administered 2015-03-23 – 2015-03-25 (×3): 10 mg via ORAL
  Filled 2015-03-23 (×3): qty 1

## 2015-03-23 MED ORDER — VANCOMYCIN HCL 10 G IV SOLR
2500.0000 mg | Freq: Once | INTRAVENOUS | Status: DC
  Filled 2015-03-23: qty 2500

## 2015-03-23 MED ORDER — PIPERACILLIN-TAZOBACTAM 3.375 G IVPB
3.3750 g | Freq: Once | INTRAVENOUS | Status: AC
  Administered 2015-03-23: 3.375 g via INTRAVENOUS
  Filled 2015-03-23: qty 50

## 2015-03-23 MED ORDER — ACETAMINOPHEN 650 MG RE SUPP: 650.0000 mg | Freq: Four times a day (QID) | RECTAL | Status: DC | PRN

## 2015-03-23 MED ORDER — TRAMADOL HCL 50 MG PO TABS: 50.0000 mg | ORAL_TABLET | Freq: Four times a day (QID) | ORAL | Status: DC | PRN

## 2015-03-23 MED ORDER — ASPIRIN EC 81 MG PO TBEC
81.0000 mg | DELAYED_RELEASE_TABLET | Freq: Every day | ORAL | Status: DC
  Administered 2015-03-24 – 2015-03-25 (×2): 81 mg via ORAL
  Filled 2015-03-23 (×3): qty 1

## 2015-03-23 NOTE — Progress Notes (Signed)
Pharmacy Antibiotic Follow-up Note  Aaron Moss is a 70 y.o. year-old male admitted on 03/23/2015.  The patient is currently on day 1 of Rocephin/Azithromycin for CAP  Assessment/Plan: This patient's current antibiotics will be continued without adjustments.  Temp (24hrs), Avg:99.1 F (37.3 C), Min:98.7 F (37.1 C), Max:99.4 F (37.4 C)   Recent Labs Lab 03/23/15 0802  WBC 12.1*    Recent Labs Lab 03/23/15 0802  CREATININE 1.08   Estimated Creatinine Clearance: 76.7 mL/min (by C-G formula based on Cr of 1.08).    Allergies  Allergen Reactions  . Pregabalin     REACTION: felt bad   Antimicrobials this admission: 1/3 Ceftriaxone >>  1/3 Azithromycin po >>   Microbiology results: 1/3 BCx: sent 1/3 UCx: sent   Sputum: ordered, not colleted Strep pneumo: ordered, not collected  Thank you for allowing pharmacy to be a part of this patient's care.  Minda Ditto PharmD 03/23/2015 12:06 PM

## 2015-03-23 NOTE — ED Provider Notes (Signed)
CSN: [id number]     Arrival date & time 03/23/15  [id number] History   First MD Initiated Contact with Patient 03/23/15 [phone]     Chief Complaint  Patient presents with  . Cough  . Fever     (Consider location/radiation/quality/duration/timing/severity/associated sxs/prior Treatment) Patient is a 70 y.o. male presenting with cough and fever. The history is provided by the patient.  Cough Cough characteristics:  Non-productive Severity:  Moderate Onset quality:  Gradual Duration:  2 weeks Timing:  Constant Progression:  Unchanged Chronicity:  New Smoker: no   Context: upper respiratory infection   Worsened by:  Activity Ineffective treatments: 4 doses of levaquin. Associated symptoms: chills, fever and shortness of breath   Associated symptoms: no chest pain and no rash   Fever Associated symptoms: chills and cough   Associated symptoms: no chest pain and no rash     Past Medical History  Diagnosis Date  . Hypertension   . Hyperlipidemia   . History of colon cancer   . Left knee DJD   . Diverticulosis of colon   . Prostate cancer ([place]) 05/23/13    gleason 3+4=7, volume 11.5 ml  . Colon cancer ([place]) 2000  . History of chemotherapy 2000    colon cancer  . History of radiation therapy 08/06/13- 10/03/13    prostate 7800 cGy 40 sessions, seminal vesicles 5600 cGy 40 sessions   Past Surgical History  Procedure Laterality Date  . Colon surgery  2000    colon cancer  . Knee arthroscopy  2007    LT- [facility]  . Colonoscopy    . Eus      pancreatic cyst  . Prostate biopsy  05/23/13    gleason 7, volume 11.5 ml   Family History  Problem Relation Age of Onset  . Cancer Mother     Liver  . Cancer Brother     Lung  . Cancer Maternal Grandmother     Breast  . Diabetes Brother   . Colon cancer Sister    Social History  Substance Use Topics  . Smoking status: Former Smoker -- 1.00 packs/day for 22 years    Types: Cigarettes    Quit date: 03/20/1988  . Smokeless tobacco:  Never Used  . Alcohol Use: 0.0 oz/week    0 Standard drinks or equivalent per week     Comment: rare    Review of Systems  Constitutional: Positive for fever and chills.  Respiratory: Positive for cough and shortness of breath.   Cardiovascular: Negative for chest pain.  Skin: Negative for rash.  All other systems reviewed and are negative.     Allergies  Pregabalin  Home Medications   Prior to Admission medications   Medication Sig Start Date End Date Taking? Authorizing Provider  amLODipine (NORVASC) 10 MG tablet Take 1 tablet (10 mg total) by mouth daily. 11/27/14   Biagio Borg, MD  aspirin EC 81 MG tablet Take 81 mg by mouth daily.      Historical Provider, MD  atorvastatin (LIPITOR) 40 MG tablet Take 1 tablet (40 mg total) by mouth daily. 11/27/14   Biagio Borg, MD  benazepril (LOTENSIN) 40 MG tablet Take 1 tablet (40 mg total) by mouth daily. 11/27/14   Biagio Borg, MD  diclofenac (VOLTAREN) 75 MG EC tablet TAKE ONE TABLET BY MOUTH TWICE DAILY AS NEEDED FOR PAIN 09/01/14   Biagio Borg, MD  hydrochlorothiazide (HYDRODIURIL) 25 MG tablet Take 1 tablet (25 mg total)  by mouth daily. 11/27/14   Biagio Borg, MD  HYDROcodone-homatropine [facility]) 5-1.5 MG/5ML syrup Take 5 mLs by mouth every 6 (six) hours as needed for cough. 03/18/15   Biagio Borg, MD  KLOR-CON M10 10 MEQ tablet TAKE ONE TABLET BY MOUTH ONCE DAILY Patient not taking: Reported on 02/05/2015 12/21/14   Biagio Borg, MD  levofloxacin (LEVAQUIN) 250 MG tablet Take 1 tablet (250 mg total) by mouth daily. 03/18/15   Biagio Borg, MD  potassium chloride (K-DUR) 10 MEQ tablet Take 1 tablet (10 mEq total) by mouth daily. 11/27/14   Biagio Borg, MD  traMADol (ULTRAM) 50 MG tablet TAKE ONE TABLET BY MOUTH EVERY 6 HOURS AS NEEDED FOR PAIN 01/01/15   Biagio Borg, MD  vardenafil (LEVITRA) 20 MG tablet Take 1 tablet (20 mg total) by mouth as needed for erectile dysfunction. 08/29/11 09/28/11  Biagio Borg, MD   BP 151/74 mmHg  Pulse  115  Temp(Src) 99.4 F (37.4 C) (Oral)  Resp 24  SpO2 96% Physical Exam  Constitutional: He is oriented to person, place, and time. He appears well-developed and well-nourished. No distress.  HENT:  Head: Normocephalic and atraumatic.  Eyes: Conjunctivae are normal.  Neck: Neck supple. No tracheal deviation present.  Cardiovascular: Normal rate and regular rhythm.   Pulmonary/Chest: Effort normal. No accessory muscle usage. No respiratory distress. He has no decreased breath sounds. He has rhonchi in the left lower field. He has no rales.  Abdominal: Soft. He exhibits no distension.  Neurological: He is alert and oriented to person, place, and time.  Skin: Skin is warm and dry.  Psychiatric: He has a normal mood and affect.    ED Course  Procedures (including critical care time) Labs Review Labs Reviewed  BASIC METABOLIC PANEL - Abnormal; Notable for the following:    Potassium 3.3 (*)    Calcium 8.7 (*)    All other components within normal limits  CBC WITH DIFFERENTIAL/PLATELET - Abnormal; Notable for the following:    WBC 12.1 (*)    RDW 16.7 (*)    Neutro Abs 10.8 (*)    All other components within normal limits  I-STAT CG4 LACTIC ACID, ED - Abnormal; Notable for the following:    Lactic Acid, Venous 2.04 (*)    All other components within normal limits  CULTURE, BLOOD (ROUTINE X 2)  CULTURE, BLOOD (ROUTINE X 2)  URINE CULTURE  URINALYSIS, ROUTINE W REFLEX MICROSCOPIC (NOT AT [facility])    Imaging Review Dg Chest 2 View  03/23/2015  CLINICAL DATA:  Cough for 2 weeks.  Bronchitis. EXAM: CHEST  2 VIEW COMPARISON:  08/18/2013. FINDINGS: The heart size and mediastinal contours are within normal limits. Both lungs are clear. The visualized skeletal structures are unremarkable. Surgical clips below the diaphragm represent colon surgery. No pulmonary nodules. IMPRESSION: No active cardiopulmonary disease.  Improved aeration from priors. Electronically Signed   By: Staci Righter M.D.    On: 03/23/2015 07:57   Ct Angio Chest Pe W/cm &/or Wo Cm  03/23/2015  CLINICAL DATA:  Worsening cough for 2 weeks. Back pain and chills. Recently diagnosed with acute bronchitis. History of prostate cancer status post radiation therapy. Remote history of colon cancer. EXAM: CT ANGIOGRAPHY CHEST WITH CONTRAST TECHNIQUE: Multidetector CT imaging of the chest was performed using the standard protocol during bolus administration of intravenous contrast. Multiplanar CT image reconstructions and MIPs were obtained to evaluate the vascular anatomy. CONTRAST:  175mL OMNIPAQUE IOHEXOL  350 MG/ML SOLN COMPARISON:  Chest radiographs 03/23/2015 FINDINGS: Pulmonary arterial opacification is adequate, however evaluation is mildly limited by respiratory and cardiac motion artifact. Within these limitations, no sizable pulmonary embolus is identified Three-vessel coronary artery calcification is noted. The heart is upper limits of normal in size. No pleural or pericardial effusion is seen. Subcentimeter AP window and right hilar lymph nodes may be reactive. Evaluation of the lung parenchyma is mildly limited by motion artifact. There is a small region of patchy opacity including peribronchovascular nodularity in the posterior left upper lobe/lingula. Surgical clips are partially visualized in the left upper quadrant of the abdomen. 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 A 3.6 x 2.3 cm lipoma is noted in the right infraspinatus muscle. Review of the MIP images confirms the above findings. IMPRESSION: 1. Mildly motion degraded examination without evidence of pulmonary emboli. 2. Patchy left upper lobe/lingula opacity compatible with pneumonia. Electronically Signed   By: Logan Bores M.D.   On: 03/23/2015 09:25   I have personally reviewed and evaluated these images and lab results as part of my medical decision-making.   EKG Interpretation   Date/Time:  Tuesday March 23 2015 08:14:45 EST Ventricular Rate:  99 PR Interval:  156 QRS Duration: 80 QT Interval:  342 QTC Calculation: 439 R Axis:   -28 Text Interpretation:  Sinus rhythm Borderline left axis deviation No  significant change since last tracing Confirmed by Joletta Manner MD, Vennie  [id number]) on 03/23/2015 8:38:00 AM      MDM   Final diagnoses:  Cough  Sepsis, due to unspecified organism [facility])  Community acquired pneumonia  Failure of outpatient treatment    70 year old male presents with 2 weeks of ongoing cough, has had 4 days of Levaquin therapy for presumed acute bronchitis treated by primary care physician. Has had chills and some shortness of breath at home. No significant opacities on chest x-ray to suggest bacterial pneumonia. EKG shows sinus tachycardia, patient is mildly tachypneic on arrival. Has risk factor of previous malignancy requiring treatment, will pursue CT scan for rule out of pulmonary embolism with wells score of 2.5.  CT is consistent with opacity concerning for pneumonia. Has been partially treated with Levaquin 250 mg daily over the last 4 days and appears to have failed outpatient management. Patient meets clinical criteria for sepsis with at least 2 SIRS and suspected source of CAP. Antibiotics broadened pending cultures. Hospitalist was consulted for admission and will see the patient in the emergency department.   Leo Grosser, MD 03/23/15 1020

## 2015-03-23 NOTE — ED Notes (Signed)
Pt can go to floor at 1350

## 2015-03-23 NOTE — ED Notes (Signed)
Started with a cough x 2 weeks, diagnosed with bronchitis on 12/29, has been taking antibiotics w/o improvement. Came today for worsening cough, chills, and back pain. Occasionally productive cough w/ clear sputum, lung sounds clear bilaterally.

## 2015-03-23 NOTE — H&P (Signed)
History and Physical  Aaron Moss [id number] DOB: 1945/10/22 DOA: 03/23/2015  Referring physician: Dr. Leo Grosser, EDP PCP: Cathlean Cower, MD  Outpatient Specialists:  1. None  Chief Complaint: Cough  HPI: Aaron Moss is a 70 y.o. male , married, independent of activities of daily living, PMH of HTN, HLD, prostate cancer-said to be in remission, colon cancer status post colectomy, chemotherapy and radiation-said to be in remission, presented to the [facility] ED on 03/22/14 with complaints of persistent cough and not feeling well. He gives 2 weeks history of illness. He initially had dry cough, rhinorrhea and feeling bad. He tried OTC medications including NyQuil at bedtime with no significant relief. He was treated for acute bronchitis with levofloxacin by his PCP on 03/18/15 and has completed 4 days course. Despite this, the patient's symptoms have persisted or have gotten worse. He states that last night he had relentless cough, mostly clear but at times slightly yellow sputum, wheezing without dyspnea or chest pain and decreased appetite. He denies fever, nasal stuffiness, sore throat or earache. He presented to the ED where initially he was mildly tachycardic, tachypneic, low-grade fever, mild leukocytosis. Chest x-ray did not show acute findings. Patient underwent CTA chest which was negative for PE but showed patchy left upper lobe/lingula opacity compatible with pneumonia. Sepsis protocol was initiated. Hospitalist admission was requested.   Review of Systems: All systems reviewed and apart from history of presenting illness, are negative.  Past Medical History  Diagnosis Date  . Hypertension   . Hyperlipidemia   . History of colon cancer   . Left knee DJD   . Diverticulosis of colon   . 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 Colon cancer ([place]) 2000  . History of chemotherapy 2000    colon cancer  . History of radiation therapy  08/06/13- 10/03/13    prostate 7800 cGy 40 sessions, seminal vesicles 5600 cGy 40 sessions   Past Surgical History  Procedure Laterality Date  . Colon surgery  2000    colon cancer  . Knee arthroscopy  2007    LT- [facility]  . Colonoscopy    . Eus      pancreatic cyst  . Prostate biopsy  05/23/13    gleason 7, volume 11.5 ml   Social History:  reports that he quit smoking about 27 years ago. His smoking use included Cigarettes. He has a 22 pack-year smoking history. He has never used smokeless tobacco. He reports that he drinks alcohol. He reports that he does not use illicit drugs. Rest as per history of presenting illness.  Allergies  Allergen Reactions  . Pregabalin     REACTION: felt bad    Family History  Problem Relation Age of Onset  . Cancer Mother     Liver  . Cancer Brother     Lung  . Cancer Maternal Grandmother     Breast  . Diabetes Brother   . Colon cancer Sister     Prior to Admission medications   Medication Sig Start Date End Date Taking? Authorizing Provider  amLODipine (NORVASC) 10 MG tablet Take 1 tablet (10 mg total) by mouth daily. 11/27/14  Yes Biagio Borg, MD  aspirin EC 81 MG tablet Take 81 mg by mouth daily.     Yes Historical Provider, MD  atorvastatin (LIPITOR) 40 MG tablet Take 1 tablet (40 mg total) by mouth daily. 11/27/14  Yes Jeneen Rinks  Quin Hoop, MD  benazepril (LOTENSIN) 40 MG tablet Take 1 tablet (40 mg total) by mouth daily. 11/27/14  Yes Biagio Borg, MD  diclofenac (VOLTAREN) 75 MG EC tablet TAKE ONE TABLET BY MOUTH TWICE DAILY AS NEEDED FOR PAIN 09/01/14  Yes Biagio Borg, MD  hydrochlorothiazide (HYDRODIURIL) 25 MG tablet Take 1 tablet (25 mg total) by mouth daily. 11/27/14  Yes Biagio Borg, MD  HYDROcodone-homatropine [facility]) 5-1.5 MG/5ML syrup Take 5 mLs by mouth every 6 (six) hours as needed for cough. 03/18/15  Yes Biagio Borg, MD  levofloxacin (LEVAQUIN) 250 MG tablet Take 1 tablet (250 mg total) by mouth daily. 03/18/15  Yes Biagio Borg, MD   potassium chloride (K-DUR) 10 MEQ tablet Take 1 tablet (10 mEq total) by mouth daily. 11/27/14  Yes Biagio Borg, MD  Pseudoeph-Doxylamine-DM-APAP (NYQUIL PO) Take 1 Dose by mouth at bedtime as needed (cold symptoms).   Yes Historical Provider, MD  traMADol (ULTRAM) 50 MG tablet TAKE ONE TABLET BY MOUTH EVERY 6 HOURS AS NEEDED FOR PAIN 01/01/15  Yes Biagio Borg, MD  vardenafil (LEVITRA) 20 MG tablet Take 1 tablet (20 mg total) by mouth as needed for erectile dysfunction. 08/29/11 09/28/11  Biagio Borg, MD   Physical Exam: Filed Vitals:   03/23/15 1007 03/23/15 1040 03/23/15 1050 03/23/15 1051  BP:  117/56 131/61 131/61  Pulse:  92 97 99  Temp:    98.7 F (37.1 C)  TempSrc:    Oral  Resp:  15 20 15   Weight: 120.203 kg (265 lb)     SpO2:  97% 97% 97%     General exam: Moderately built and nourished pleasant middle-aged male patient, lying comfortably propped up on the gurney in no obvious distress. Does not look septic or toxic.  Head, eyes and ENT: Nontraumatic and normocephalic. Pupils equally reacting to light and accommodation. Oral mucosa slightly dry. Posterior pharyngeal wall mildly congested without drainage.  Neck: Supple. No JVD, carotid bruit or thyromegaly.  Lymphatics: No lymphadenopathy.  Respiratory system: Slightly diminished breath sounds in the bases with occasional basal crackles but otherwise clear to auscultation. No increased work of breathing.  Cardiovascular system: S1 and S2 heard, RRR. No JVD, murmurs, gallops, clicks or pedal edema.  Gastrointestinal system: Abdomen is nondistended, soft and nontender. Normal bowel sounds heard. No organomegaly or masses appreciated. Laparotomy scar +  Central nervous system: Alert and oriented. No focal neurological deficits.  Extremities: Symmetric 5 x 5 power. Peripheral pulses symmetrically felt.   Skin: No rashes or acute findings.  Musculoskeletal system: Negative exam.  Psychiatry: Pleasant and  cooperative.   Labs on Admission:  Basic Metabolic Panel:  Recent Labs Lab 03/23/15 0802  NA 144  K 3.3*  CL 106  CO2 30  GLUCOSE 95  BUN 13  CREATININE 1.08  CALCIUM 8.7*   Liver Function Tests: No results for input(s): AST, ALT, ALKPHOS, BILITOT, PROT, ALBUMIN in the last 168 hours. No results for input(s): LIPASE, AMYLASE in the last 168 hours. No results for input(s): AMMONIA in the last 168 hours. CBC:  Recent Labs Lab 03/23/15 0802  WBC 12.1*  NEUTROABS 10.8*  HGB 14.1  HCT 44.2  MCV 81.9  PLT 171   Cardiac Enzymes: No results for input(s): CKTOTAL, CKMB, CKMBINDEX, TROPONINI in the last 168 hours.  BNP (last 3 results) No results for input(s): PROBNP in the last 8760 hours. CBG: No results for input(s): GLUCAP in the last 168 hours.  Radiological Exams on Admission: Dg Chest 2 View  03/23/2015  CLINICAL DATA:  Cough for 2 weeks.  Bronchitis. EXAM: CHEST  2 VIEW COMPARISON:  08/18/2013. FINDINGS: The heart size and mediastinal contours are within normal limits. Both lungs are clear. The visualized skeletal structures are unremarkable. Surgical clips below the diaphragm represent colon surgery. No pulmonary nodules. IMPRESSION: No active cardiopulmonary disease.  Improved aeration from priors. Electronically Signed   By: Staci Righter M.D.   On: 03/23/2015 07:57   Ct Angio Chest Pe W/cm &/or Wo Cm  03/23/2015  CLINICAL DATA:  Worsening cough for 2 weeks. Back pain and chills. Recently diagnosed with acute bronchitis. History of prostate cancer status post radiation therapy. Remote history of colon cancer. EXAM: CT ANGIOGRAPHY CHEST WITH CONTRAST TECHNIQUE: Multidetector CT imaging of the chest was performed using the standard protocol during bolus administration of intravenous contrast. Multiplanar CT image reconstructions and MIPs were obtained to evaluate the vascular anatomy. CONTRAST:  150mL OMNIPAQUE IOHEXOL 350 MG/ML SOLN COMPARISON:  Chest radiographs  03/23/2015 FINDINGS: Pulmonary arterial opacification is adequate, however evaluation is mildly limited by respiratory and cardiac motion artifact. Within these limitations, no sizable pulmonary embolus is identified Three-vessel coronary artery calcification is noted. The heart is upper limits of normal in size. No pleural or pericardial effusion is seen. Subcentimeter AP window and right hilar lymph nodes may be reactive. Evaluation of the lung parenchyma is mildly limited by motion artifact. There is a small region of patchy opacity including peribronchovascular nodularity in the posterior left upper lobe/lingula. Surgical clips are partially visualized in the left upper quadrant of the abdomen. 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 A 3.6 x 2.3 cm lipoma is noted in the right infraspinatus muscle. Review of the MIP images confirms the above findings. IMPRESSION: 1. Mildly motion degraded examination without evidence of pulmonary emboli. 2. Patchy left upper lobe/lingula opacity compatible with pneumonia. Electronically Signed   By: Logan Bores M.D.   On: 03/23/2015 09:25    EKG: Independently reviewed. Sinus rhythm, possible LAD and no acute changes. QTC 439 ms.  Assessment/Plan Principal Problem:   CAP (community acquired pneumonia) Active Problems:   Hyperlipidemia   Essential hypertension   History of malignant neoplasm of large intestine   Hypokalemia   Sepsis ([place])   1. Community-acquired pneumonia (left upper lobe/lingula): Failed outpatient oral antibiotics/levofloxacin. Received a dose of IV Zosyn in ED. Will treat with IV Rocephin and azithromycin. Check urine Legionella, streptococcal antigen, blood cultures, HIV antibody and flu panel PCR. Placed on droplet isolation until ruled out. 2. Sepsis, present on admission: Secondary to pneumonia. Treating per sepsis protocol with IV fluids and antibiotic as above. Improving. 3. Elevated  lactate: Secondary to sepsis. Management as above. Trend serial lactates to normal. 4. Essential hypertension: Controlled. Continue home medications 5. Hyperlipidemia: Continue statins 6. Hypokalemia: Replace and follow. Temporarily hold HCTZ while in hospital. 7. History of prostate cancer and colon cancer: Said to be in remission.    DVT prophylaxis: Lovenox Code Status: Full  Family Communication: Discussed with patient's spouse at bedside  Disposition Plan: DC home when medically stable, possibly in the next 48 hours.   Time spent: 21 minutes  HONGALGI,ANAND, MD, FACP, FHM. [facility] Pager [phone]  If 7PM-7AM, please contact night-coverage [email or web address] Password [email or web address] 03/23/2015, 12:02 PM

## 2015-03-24 DIAGNOSIS — J189 Pneumonia, unspecified organism: Secondary | ICD-10-CM

## 2015-03-24 DIAGNOSIS — D72829 Elevated white blood cell count, unspecified: Secondary | ICD-10-CM | POA: Diagnosis present

## 2015-03-24 LAB — CBC
HCT: 39.4 % (ref 39.0–52.0)
HEMOGLOBIN: 12.4 g/dL — AB (ref 13.0–17.0)
MCH: 25.6 pg — ABNORMAL LOW (ref 26.0–34.0)
MCHC: 31.5 g/dL (ref 30.0–36.0)
MCV: 81.4 fL (ref 78.0–100.0)
PLATELETS: 196 10*3/uL (ref 150–400)
RBC: 4.84 MIL/uL (ref 4.22–5.81)
RDW: 17 % — ABNORMAL HIGH (ref 11.5–15.5)
WBC: 12.9 10*3/uL — AB (ref 4.0–10.5)

## 2015-03-24 LAB — INFLUENZA PANEL BY PCR (TYPE A & B)
H1N1FLUPCR: NOT DETECTED
INFLAPCR: NEGATIVE
Influenza B By PCR: NEGATIVE

## 2015-03-24 LAB — BASIC METABOLIC PANEL
ANION GAP: 8 (ref 5–15)
BUN: 11 mg/dL (ref 6–20)
CHLORIDE: 113 mmol/L — AB (ref 101–111)
CO2: 26 mmol/L (ref 22–32)
CREATININE: 0.99 mg/dL (ref 0.61–1.24)
Calcium: 8.7 mg/dL — ABNORMAL LOW (ref 8.9–10.3)
GFR calc Af Amer: >60 mL/min (ref >60)
GFR calc non Af Amer: >60 mL/min (ref >60)
Glucose, Bld: 97 mg/dL (ref 65–99)
Potassium: 3.6 mmol/L (ref 3.5–5.1)
Sodium: 147 mmol/L — ABNORMAL HIGH (ref 135–145)

## 2015-03-24 LAB — STREP PNEUMONIAE URINARY ANTIGEN: Strep Pneumo Urinary Antigen: NEGATIVE

## 2015-03-24 LAB — HIV ANTIBODY (ROUTINE TESTING W REFLEX): HIV SCREEN 4TH GENERATION: NONREACTIVE

## 2015-03-24 MED ORDER — POTASSIUM CHLORIDE 20 MEQ/15ML (10%) PO SOLN
40.0000 meq | Freq: Once | ORAL | Status: AC
  Administered 2015-03-24: 40 meq via ORAL
  Filled 2015-03-24: qty 30

## 2015-03-24 MED ORDER — OSELTAMIVIR PHOSPHATE 75 MG PO CAPS
75.0000 mg | ORAL_CAPSULE | Freq: Two times a day (BID) | ORAL | Status: DC
  Administered 2015-03-24 – 2015-03-25 (×3): 75 mg via ORAL
  Filled 2015-03-24 (×4): qty 1

## 2015-03-24 MED ORDER — ZOLPIDEM TARTRATE 5 MG PO TABS
5.0000 mg | ORAL_TABLET | Freq: Every evening | ORAL | Status: DC | PRN
  Administered 2015-03-24 (×2): 5 mg via ORAL
  Filled 2015-03-24 (×2): qty 1

## 2015-03-24 NOTE — Progress Notes (Signed)
[facility] PROGRESS NOTE  DRAEDEN VIELE [id number] DOB: 07/12/1945 DOA: 03/23/2015 PCP: Cathlean Cower, MD  Summary 03/24/15: Aaron Moss seen and examined Aaron Moss at bedside and reviewed his chart. Aaron Moss is a pleasant 70 y.o. male  With Essential HTN, HLD, prostate cancer-said to be in remission, colon cancer status post colectomy, chemotherapy and radiation-said to be in remission, who presented to the [facility] ED on 03/22/14 with complaints of persistent cough and not feeling well for 2 weeks and he did not respond to treatment for acute bronchitis with levofloxacin(completed 4 days). He was found to have left lower lobe pneumonia by CTA chest at the time of admission, "1. Mildly motion degraded examination without evidence of pulmonary emboli. 2. Patchy left upper lobe/lingula opacity compatible with pneumonia". His white count was 12,100. Influenza swab negative. Unfortunately, white count continues to increase to 12900 today. Patient says that his 2 grandkids had the "cold", hence he'll likely picked up for infection from them. Otherwise he feels better today than when he came in. [place] therefore continue ceftriaxone/Zithromax and add Tamiflu. Plan CAP (community acquired pneumonia)/Sepsis (HCC)/Leukocytosis  Day 2 Ceftriaxone/Zithromax  Add Tamiflu                                                                         Hyperlipidemia/Essential hypertension/History of malignant neoplasm of large intestine  No acute changes  Continue current management Hypokalemia  Replenish electrolytes as needed  Code Status: Full code Family Communication: Spoke with patient at bedside. Disposition Plan: Home in the next 1-2 days if he continues to do well   Consultants:  None  Procedures:  None  Antibiotics:  Ceftriaxone 03/23/2015>  Zithromax 03/23/2015>  Tamiflu 03/24/2015>  HPI/Subjective: Feels better. Less dyspneic.  Objective: Filed Vitals:    03/24/15 0529 03/24/15 1011  BP: 155/75 150/72  Pulse: 86   Temp: 98 F (36.7 C)   Resp: 20     Intake/Output Summary (Last 24 hours) at 03/24/15 1451 Last data filed at 03/24/15 0600  Gross per 24 hour  Intake   1785 ml  Output      1 ml  Net   1784 ml   Filed Weights   03/23/15 1007 03/23/15 1409 03/24/15 0529  Weight: 120.203 kg (265 lb) 120.203 kg (265 lb) 119.976 kg (264 lb 8 oz)    Exam:   General:  Comfortable at rest.  Cardiovascular: S1-S2 normal. No murmurs. Pulse regular.  Respiratory: Good air entry bilaterally. No rhonchi or rales.  Abdomen: Soft and nontender. Normal bowel sounds. No organomegaly.  Musculoskeletal: No pedal edema   Neurological: Intact  Data Reviewed: Basic Metabolic Panel:  Recent Labs Lab 03/23/15 0802 03/24/15 0443  NA 144 147*  K 3.3* 3.6  CL 106 113*  CO2 30 26  GLUCOSE 95 97  BUN 13 11  CREATININE 1.08 0.99  CALCIUM 8.7* 8.7*   Liver Function Tests: No results for input(s): AST, ALT, ALKPHOS, BILITOT, PROT, ALBUMIN in the last 168 hours. No results for input(s): LIPASE, AMYLASE in the last 168 hours. No results for input(s): AMMONIA in the last 168 hours. CBC:  Recent Labs Lab 03/23/15 0802 03/24/15 0443  WBC 12.1* 12.9*  NEUTROABS 10.8*  --  HGB 14.1 12.4*  HCT 44.2 39.4  MCV 81.9 81.4  PLT 171 196   Cardiac Enzymes: No results for input(s): CKTOTAL, CKMB, CKMBINDEX, TROPONINI in the last 168 hours. BNP (last 3 results) No results for input(s): BNP in the last 8760 hours.  ProBNP (last 3 results) No results for input(s): PROBNP in the last 8760 hours.  CBG: No results for input(s): GLUCAP in the last 168 hours.  Recent Results (from the past 240 hour(s))  Culture, blood (routine x 2)     Status: None (Preliminary result)   Collection Time: 03/23/15 10:00 AM  Result Value Ref Range Status   Specimen Description BLOOD RIGHT HAND  Final   Special Requests BOTTLES DRAWN AEROBIC AND ANAEROBIC 5CC   Final   Culture   Final    NO GROWTH 1 DAY Performed at [facility]    Report Status PENDING  Incomplete  Urine culture     Status: None (Preliminary result)   Collection Time: 03/23/15 10:31 AM  Result Value Ref Range Status   Specimen Description URINE, CLEAN CATCH  Final   Special Requests NONE  Final   Culture   Final    CULTURE REINCUBATED FOR BETTER GROWTH Performed at [facility]    Report Status PENDING  Incomplete     Studies: Dg Chest 2 View  03/23/2015  CLINICAL DATA:  Cough for 2 weeks.  Bronchitis. EXAM: CHEST  2 VIEW COMPARISON:  08/18/2013. FINDINGS: The heart size and mediastinal contours are within normal limits. Both lungs are clear. The visualized skeletal structures are unremarkable. Surgical clips below the diaphragm represent colon surgery. No pulmonary nodules. IMPRESSION: No active cardiopulmonary disease.  Improved aeration from priors. Electronically Signed   By: Staci Righter M.D.   On: 03/23/2015 07:57   Ct Angio Chest Pe W/cm &/or Wo Cm  03/23/2015  CLINICAL DATA:  Worsening cough for 2 weeks. Back pain and chills. Recently diagnosed with acute bronchitis. History of prostate cancer status post radiation therapy. Remote history of colon cancer. EXAM: CT ANGIOGRAPHY CHEST WITH CONTRAST TECHNIQUE: Multidetector CT imaging of the chest was performed using the standard protocol during bolus administration of intravenous contrast. Multiplanar CT image reconstructions and MIPs were obtained to evaluate the vascular anatomy. CONTRAST:  167mL OMNIPAQUE IOHEXOL 350 MG/ML SOLN COMPARISON:  Chest radiographs 03/23/2015 FINDINGS: Pulmonary arterial opacification is adequate, however evaluation is mildly limited by respiratory and cardiac motion artifact. Within these limitations, no sizable pulmonary embolus is identified Three-vessel coronary artery calcification is noted. The heart is upper limits of normal in size. No pleural or pericardial effusion is seen.  Subcentimeter AP window and right hilar lymph nodes may be reactive. Evaluation of the lung parenchyma is mildly limited by motion artifact. There is a small region of patchy opacity including peribronchovascular nodularity in the posterior left upper lobe/lingula. Surgical clips are partially visualized in the left upper quadrant of the abdomen. 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 A 3.6 x 2.3 cm lipoma is noted in the right infraspinatus muscle. Review of the MIP images confirms the above findings. IMPRESSION: 1. Mildly motion degraded examination without evidence of pulmonary emboli. 2. Patchy left upper lobe/lingula opacity compatible with pneumonia. Electronically Signed   By: Logan Bores M.D.   On: 03/23/2015 09:25    Scheduled Meds: . amLODipine  10 mg Oral Daily  . aspirin EC  81 mg Oral Daily  . atorvastatin  40 mg Oral  Daily  . azithromycin  500 mg Oral Q24H  . benazepril  40 mg Oral Daily  . benzonatate  100 mg Oral TID  . cefTRIAXone (ROCEPHIN)  IV  1 g Intravenous Q24H  . enoxaparin (LOVENOX) injection  40 mg Subcutaneous Q24H  . guaiFENesin  1,200 mg Oral BID  . potassium chloride  10 mEq Oral Daily  . potassium chloride  40 mEq Oral Once   Continuous Infusions:    Time spent: 25 minutes    Aaron Moss  [facility] Pager [phone]. If 7PM-7AM, please contact night-coverage at [email or web address], password [facility] 03/24/2015, 2:51 PM  LOS: 1 day

## 2015-03-24 NOTE — Progress Notes (Signed)
Pt's Flu PCR negative and Infection  Prevention consulted and Droplet Precautions were discontinued at 12:50pm MD has made rounds and ordered TamiFlu for the Pt and Infection Prevention consulted and Pt placed back back on Droplet Precautions. Pt given update regarding being placed back on Droplet precautions.

## 2015-03-25 DIAGNOSIS — E872 Acidosis, unspecified: Secondary | ICD-10-CM | POA: Diagnosis present

## 2015-03-25 LAB — CBC
HCT: 42.2 % (ref 39.0–52.0)
Hemoglobin: 13.6 g/dL (ref 13.0–17.0)
MCH: 25.8 pg — ABNORMAL LOW (ref 26.0–34.0)
MCHC: 32.2 g/dL (ref 30.0–36.0)
MCV: 79.9 fL (ref 78.0–100.0)
PLATELETS: 217 10*3/uL (ref 150–400)
RBC: 5.28 MIL/uL (ref 4.22–5.81)
RDW: 16.8 % — AB (ref 11.5–15.5)
WBC: 11.1 10*3/uL — AB (ref 4.0–10.5)

## 2015-03-25 LAB — COMPREHENSIVE METABOLIC PANEL
ALBUMIN: 3.8 g/dL (ref 3.5–5.0)
ALT: 76 U/L — ABNORMAL HIGH (ref 17–63)
AST: 27 U/L (ref 15–41)
Alkaline Phosphatase: 86 U/L (ref 38–126)
Anion gap: 8 (ref 5–15)
BUN: 10 mg/dL (ref 6–20)
CHLORIDE: 112 mmol/L — AB (ref 101–111)
CO2: 25 mmol/L (ref 22–32)
Calcium: 9.3 mg/dL (ref 8.9–10.3)
Creatinine, Ser: 1.02 mg/dL (ref 0.61–1.24)
GFR calc Af Amer: >60 mL/min (ref >60)
GFR calc non Af Amer: >60 mL/min (ref >60)
GLUCOSE: 101 mg/dL — AB (ref 65–99)
POTASSIUM: 3.7 mmol/L (ref 3.5–5.1)
Sodium: 145 mmol/L (ref 135–145)
Total Bilirubin: 1.4 mg/dL — ABNORMAL HIGH (ref 0.3–1.2)
Total Protein: 7.4 g/dL (ref 6.5–8.1)

## 2015-03-25 LAB — URINE CULTURE

## 2015-03-25 LAB — MAGNESIUM: Magnesium: 2.1 mg/dL (ref 1.7–2.4)

## 2015-03-25 LAB — PHOSPHORUS: Phosphorus: 3.4 mg/dL (ref 2.5–4.6)

## 2015-03-25 MED ORDER — OSELTAMIVIR PHOSPHATE 75 MG PO CAPS
75.0000 mg | ORAL_CAPSULE | Freq: Two times a day (BID) | ORAL | Status: DC
Start: 2015-03-25 — End: 2015-05-28

## 2015-03-25 MED ORDER — BENZONATATE 100 MG PO CAPS: 100.0000 mg | ORAL_CAPSULE | Freq: Three times a day (TID) | ORAL | Status: DC

## 2015-03-25 MED ORDER — PANTOPRAZOLE SODIUM 40 MG PO TBEC
40.0000 mg | DELAYED_RELEASE_TABLET | Freq: Every day | ORAL | Status: DC
  Administered 2015-03-25: 40 mg via ORAL
  Filled 2015-03-25: qty 1

## 2015-03-25 MED ORDER — DOXYCYCLINE HYCLATE 100 MG PO TABS
100.0000 mg | ORAL_TABLET | Freq: Two times a day (BID) | ORAL | Status: DC
  Administered 2015-03-25: 100 mg via ORAL
  Filled 2015-03-25: qty 1

## 2015-03-25 MED ORDER — DOXYCYCLINE HYCLATE 100 MG PO TABS: 100.0000 mg | ORAL_TABLET | Freq: Two times a day (BID) | ORAL | Status: DC

## 2015-03-25 MED ORDER — PANTOPRAZOLE SODIUM 40 MG PO TBEC: 40.0000 mg | DELAYED_RELEASE_TABLET | Freq: Every day | ORAL | Status: DC

## 2015-03-25 MED ORDER — GUAIFENESIN ER 600 MG PO TB12: 1200.0000 mg | ORAL_TABLET | Freq: Two times a day (BID) | ORAL | Status: DC

## 2015-03-25 NOTE — Progress Notes (Signed)
Patient verbalized understanding of discharge instructions. Patient is stable at discharge. 

## 2015-03-25 NOTE — Discharge Summary (Addendum)
Aaron Moss, is a 70 y.o. male  DOB 1945-06-02  MRN [id number].  Admission date:  03/23/2015  Admitting Physician  Modena Jansky, MD  Discharge Date:  05/08/2015   Primary MD  Cathlean Cower, MD  Recommendations for primary care physician for things to follow:  Repeat UA/urine culture/CBC in 1 week, consider infectious diseases referral if abnormal .   Admission Diagnosis   Cough [R05] Community acquired pneumonia [J18.9] Failure of outpatient treatment [Z78.9] Sepsis, due to unspecified organism [facility]) [A41.9]   Discharge Diagnosis  Cough [R05] Community acquired pneumonia [J18.9] Failure of outpatient treatment [Z78.9] Sepsis, due to unspecified organism ([place]) [A41.9]   Active Problems:   Hyperlipidemia   Essential hypertension   History of malignant neoplasm of large intestine    Lactic acidosis secondary to sepsis  [place] is a pleasant 70 y.o. malewith essential HTN, HLD, prostate cancer-said to be in remission, colon cancer status post colectomy, chemotherapy and radiation-said to be in remission, who presented to the [facility] ED on 03/22/14 with complaints of persistent cough and not feeling well for 2 weeks and he did not respond to treatment for acute bronchitis with Levofloxacin(completed 4 days). He was found to have left lower lobe pneumonia by CTA chest at the time of admission, "1. Mildly motion degraded examination without evidence of pulmonary emboli. 2. Patchy left upper lobe/lingula opacity compatible with pneumonia". His white count was 12,100. Influenza swab negative. Patient reported that his 2 grandkids had the "cold", hence he likely picked up infection from them. While his UA was okay, urine culture was positive for an insignificant number of colonies of  staph coagulase-negative species. The significance of this is not clear. Patient was treated with ceftriaxone/Zithromax/Tamiflu and will be discharged home on doxycycline/Tamiflu to complete course and follow with his PCP in the next 1-2 weeks. He feels much better today and he is eager to go home.  Discharge Condition Stable.  Consults obtained  None  Follow UP  with PCP in 1-2 weeks   Discharge Instructions  and  Discharge Medications      Discharge Instructions    Diet - low sodium heart healthy    Complete by:  As directed      Discharge instructions    Complete by:  As directed   Follow with PCP in 1-2 weeks     Increase activity slowly    Complete by:  As directed             Medication List    STOP taking these medications        levofloxacin 250 MG tablet  Commonly known as:  LEVAQUIN     NYQUIL PO     vardenafil 20 MG tablet  Commonly known as:  LEVITRA      TAKE these medications        amLODipine 10 MG tablet  Commonly known as:  NORVASC  Take 1 tablet (10 mg total) by mouth daily.  aspirin EC 81 MG tablet  Take 81 mg by mouth daily.     atorvastatin 40 MG tablet  Commonly known as:  LIPITOR  Take 1 tablet (40 mg total) by mouth daily.     benazepril 40 MG tablet  Commonly known as:  LOTENSIN  Take 1 tablet (40 mg total) by mouth daily.     benzonatate 100 MG capsule  Commonly known as:  TESSALON  Take 1 capsule (100 mg total) by mouth 3 (three) times daily.     diclofenac 75 MG EC tablet  Commonly known as:  VOLTAREN  TAKE ONE TABLET BY MOUTH TWICE DAILY AS NEEDED FOR PAIN     guaiFENesin 600 MG 12 hr tablet  Commonly known as:  MUCINEX  Take 2 tablets (1,200 mg total) by mouth 2 (two) times daily.     hydrochlorothiazide 25 MG tablet  Commonly known as:  HYDRODIURIL  Take 1 tablet (25 mg total) by mouth daily.     HYDROcodone-homatropine 5-1.5 MG/5ML syrup  Commonly known as:  HYCODAN  Take 5 mLs by mouth every 6 (six) hours  as needed for cough.     oseltamivir 75 MG capsule  Commonly known as:  TAMIFLU  Take 1 capsule (75 mg total) by mouth 2 (two) times daily.     pantoprazole 40 MG tablet  Commonly known as:  PROTONIX  Take 1 tablet (40 mg total) by mouth daily at 12 noon.     potassium chloride 10 MEQ tablet  Commonly known as:  K-DUR  Take 1 tablet (10 mEq total) by mouth daily.     traMADol 50 MG tablet  Commonly known as:  ULTRAM  TAKE ONE TABLET BY MOUTH EVERY 6 HOURS AS NEEDED FOR PAIN        Diet and Activity recommendation: See Discharge Instructions above  Major procedures and Radiology Reports - PLEASE review detailed and final reports for all details, in brief -    No results found.  Micro Results   No results found for this or any previous visit (from the past 240 hour(s)).     Today   Subjective:   Aaron Moss today has no headache,no chest abdominal pain,no new weakness tingling or numbness, feels much better wants to go home today.   Objective:   Blood pressure 144/66, pulse 73, temperature 98.8 F (37.1 C), temperature source Oral, resp. rate 20, height 5\' 6"  (1.676 m), weight 119.523 kg (263 lb 8 oz), SpO2 100 %.  No intake or output data in the 24 hours ending 05/08/15 1335  Exam Awake Alert, Oriented x 3, No new F.N deficits, Normal affect [place].AT,PERRAL Supple Neck,No JVD, No cervical lymphadenopathy appriciated.  Symmetrical Chest wall movement, Good air movement bilaterally, CTAB RRR,No Gallops,Rubs or new Murmurs, No Parasternal Heave +ve B.Sounds, Abd Soft, Non tender, No organomegaly appriciated, No rebound -guarding or rigidity. No Cyanosis, Clubbing or edema, No new Rash or bruise  Data Review   CBC w Diff:  Lab Results  Component Value Date   WBC 8.2 04/01/2015   HGB 13.7 04/01/2015   HCT 42.2 04/01/2015   PLT 220.0 04/01/2015   LYMPHOPCT 17.7 04/01/2015   MONOPCT 5.6 04/01/2015   EOSPCT 2.3 04/01/2015   BASOPCT 0.3 04/01/2015    CMP:    Lab Results  Component Value Date   NA 145 03/25/2015   K 3.7 03/25/2015   CL 112* 03/25/2015   CO2 25 03/25/2015   BUN 10 03/25/2015  CREATININE 1.02 03/25/2015   PROT 7.4 03/25/2015   ALBUMIN 3.8 03/25/2015   BILITOT 1.4* 03/25/2015   ALKPHOS 86 03/25/2015   AST 27 03/25/2015   ALT 76* 03/25/2015  .   Total Time in preparing paper work, data evaluation and todays exam - 25 minutes  Emmalie Haigh M.D on 05/08/2015 at 1:35 PM  [place]  [phone]

## 2015-03-25 NOTE — Clinical Documentation Improvement (Signed)
Internal Medicine  Based on the clinical findings below, please document any associated diagnoses/conditions the patient has or may have.   Lactic acidosis  Other  Clinically Undetermined  Supporting Information: -- On admit Lactic acid 2.04 -- H&P: "Elevated lactate: Secondary to sepsis"  Please exercise your independent, professional judgment when responding. A specific answer is not anticipated or expected.   Thank You, Ezekiel Ina RN [place] [phone]

## 2015-03-26 ENCOUNTER — Telehealth: Payer: Self-pay | Admitting: *Deleted

## 2015-03-26 NOTE — Telephone Encounter (Signed)
Transition Care Management Follow-up Telephone Call   Date discharged? 03/25/15   How have you been since you were released from the hospital? Called pt spoke with wife she stated he is doing alright   Do you understand why you were in the hospital? YES   Do you understand the discharge instructions? YES   Where were you discharged to? Home   Items Reviewed:  Medications reviewed: YES  Allergies reviewed: YES  Dietary changes reviewed: NO  Referrals reviewed: No referral needed   Functional Questionnaire:   Activities of Daily Living (ADLs):   She states he are independent in the following: ambulation, bathing and hygiene, feeding, continence, grooming, toileting and dressing States he doesn't require assistance    Any transportation issues/concerns?: NO  Any patient concerns? NO   Confirmed importance and date/time of follow-up visits scheduled YES, made appt 04/01/15  Provider Appointment booked with Dr. Jenny Reichmann  Confirmed with patient if condition begins to worsen call PCP or go to the ER.  Patient was given the office number and encouraged to call back with question or concerns.  : YES

## 2015-03-28 LAB — CULTURE, BLOOD (ROUTINE X 2): Culture: NO GROWTH

## 2015-04-01 ENCOUNTER — Ambulatory Visit (INDEPENDENT_AMBULATORY_CARE_PROVIDER_SITE_OTHER): Payer: Medicare Other | Admitting: Internal Medicine

## 2015-04-01 ENCOUNTER — Encounter: Payer: Self-pay | Admitting: Internal Medicine

## 2015-04-01 ENCOUNTER — Other Ambulatory Visit (INDEPENDENT_AMBULATORY_CARE_PROVIDER_SITE_OTHER): Payer: Medicare Other

## 2015-04-01 VITALS — BP 134/72 | HR 73 | Temp 97.3°F | Ht 66.0 in | Wt 268.0 lb

## 2015-04-01 DIAGNOSIS — D72829 Elevated white blood cell count, unspecified: Secondary | ICD-10-CM | POA: Diagnosis not present

## 2015-04-01 DIAGNOSIS — J189 Pneumonia, unspecified organism: Secondary | ICD-10-CM | POA: Insufficient documentation

## 2015-04-01 DIAGNOSIS — R059 Cough, unspecified: Secondary | ICD-10-CM

## 2015-04-01 DIAGNOSIS — R8290 Unspecified abnormal findings in urine: Secondary | ICD-10-CM | POA: Insufficient documentation

## 2015-04-01 DIAGNOSIS — R05 Cough: Secondary | ICD-10-CM

## 2015-04-01 LAB — CBC WITH DIFFERENTIAL/PLATELET
BASOS ABS: 0 10*3/uL (ref 0.0–0.1)
BASOS PCT: 0.3 % (ref 0.0–3.0)
EOS ABS: 0.2 10*3/uL (ref 0.0–0.7)
Eosinophils Relative: 2.3 % (ref 0.0–5.0)
HEMATOCRIT: 42.2 % (ref 39.0–52.0)
HEMOGLOBIN: 13.7 g/dL (ref 13.0–17.0)
LYMPHS PCT: 17.7 % (ref 12.0–46.0)
Lymphs Abs: 1.5 10*3/uL (ref 0.7–4.0)
MCHC: 32.5 g/dL (ref 30.0–36.0)
MCV: 78.8 fl (ref 78.0–100.0)
MONO ABS: 0.5 10*3/uL (ref 0.1–1.0)
Monocytes Relative: 5.6 % (ref 3.0–12.0)
Neutro Abs: 6.1 10*3/uL (ref 1.4–7.7)
Neutrophils Relative %: 74.1 % (ref 43.0–77.0)
PLATELETS: 220 10*3/uL (ref 150.0–400.0)
RBC: 5.35 Mil/uL (ref 4.22–5.81)
RDW: 17.4 % — AB (ref 11.5–15.5)
WBC: 8.2 10*3/uL (ref 4.0–10.5)

## 2015-04-01 LAB — URINALYSIS, ROUTINE W REFLEX MICROSCOPIC
BILIRUBIN URINE: NEGATIVE
Hgb urine dipstick: NEGATIVE
KETONES UR: NEGATIVE
LEUKOCYTES UA: NEGATIVE
NITRITE: NEGATIVE
RBC / HPF: NONE SEEN (ref 0–?)
SPECIFIC GRAVITY, URINE: 1.02 (ref 1.000–1.030)
Total Protein, Urine: NEGATIVE
URINE GLUCOSE: NEGATIVE
UROBILINOGEN UA: 0.2 (ref 0.0–1.0)
WBC UA: NONE SEEN (ref 0–?)
pH: 5.5 (ref 5.0–8.0)

## 2015-04-01 NOTE — Assessment & Plan Note (Signed)
Suspect likely improved, for f/u cbc as recommended

## 2015-04-01 NOTE — Assessment & Plan Note (Signed)
Resolved,  to f/u any worsening symptoms or concerns  

## 2015-04-01 NOTE — Progress Notes (Signed)
Subjective:    Patient ID: Aaron Moss, male    DOB: 07/06/1945, 70 y.o.   MRN: [id number]  HPI  Here to f/u failed oupt tx with recent hospn for LUL pna, sepsis, and ? UTI; manifested primarily with cough only,  CTA chest:IMPRESSION: 1. Mildly motion degraded examination without evidence of pulmonary emboli. 2. Patchy left upper lobe/lingula opacity compatible with pneumonia. Treated with parenteral antibx, then d/c on tamiflu and doxy course, now finished.  Pt denies fever, cough, sob and Pt denies chest pain, increased sob or doe, wheezing, orthopnea, PND, increased LE swelling, palpitations, dizziness or syncope.  Denies urinary symptoms such as dysuria, frequency, urgency, flank pain, hematuria or n/v, fever, chills.  Urine cx was c/w 10K only staph coag neg - ? Signficance, and mild leukocyosis persists, and rec'd for f/u UA and cbc as outpt Past Medical History  Diagnosis Date  . Hypertension   . Hyperlipidemia   . History of colon cancer   . Left knee DJD   . Diverticulosis of colon   . Prostate cancer ([place]) 05/23/13    gleason 3+4=7, volume 11.5 ml  . Colon cancer ([place]) 2000  . History of chemotherapy 2000    colon cancer  . History of radiation therapy 08/06/13- 10/03/13    prostate 7800 cGy 40 sessions, seminal vesicles 5600 cGy 40 sessions   Past Surgical History  Procedure Laterality Date  . Colon surgery  2000    colon cancer  . Knee arthroscopy  2007    LT- [facility]  . Colonoscopy    . Eus      pancreatic cyst  . Prostate biopsy  05/23/13    gleason 7, volume 11.5 ml    reports that he quit smoking about 27 years ago. His smoking use included Cigarettes. He has a 22 pack-year smoking history. He has never used smokeless tobacco. He reports that he drinks alcohol. He reports that he does not use illicit drugs. family history includes Cancer in his brother, maternal grandmother, and mother; Colon cancer in his sister; Diabetes in his brother. Allergies  Allergen  Reactions  . Pregabalin     REACTION: felt bad   Current Outpatient Prescriptions on File Prior to Visit  Medication Sig Dispense Refill  . amLODipine (NORVASC) 10 MG tablet Take 1 tablet (10 mg total) by mouth daily. 90 tablet 3  . aspirin EC 81 MG tablet Take 81 mg by mouth daily.      Marland Kitchen atorvastatin (LIPITOR) 40 MG tablet Take 1 tablet (40 mg total) by mouth daily. 90 tablet 3  . benazepril (LOTENSIN) 40 MG tablet Take 1 tablet (40 mg total) by mouth daily. 90 tablet 3  . benzonatate (TESSALON) 100 MG capsule Take 1 capsule (100 mg total) by mouth 3 (three) times daily. 20 capsule 0  . diclofenac (VOLTAREN) 75 MG EC tablet TAKE ONE TABLET BY MOUTH TWICE DAILY AS NEEDED FOR PAIN 60 tablet 5  . guaiFENesin (MUCINEX) 600 MG 12 hr tablet Take 2 tablets (1,200 mg total) by mouth 2 (two) times daily. 8 tablet 0  . hydrochlorothiazide (HYDRODIURIL) 25 MG tablet Take 1 tablet (25 mg total) by mouth daily. 100 tablet 3  . pantoprazole (PROTONIX) 40 MG tablet Take 1 tablet (40 mg total) by mouth daily at 12 noon. 10 tablet 0  . potassium chloride (K-DUR) 10 MEQ tablet Take 1 tablet (10 mEq total) by mouth daily. 90 tablet 3  . traMADol (ULTRAM) 50 MG tablet  TAKE ONE TABLET BY MOUTH EVERY 6 HOURS AS NEEDED FOR PAIN 120 tablet 2  . doxycycline (VIBRA-TABS) 100 MG tablet Take 1 tablet (100 mg total) by mouth every 12 (twelve) hours. (Patient not taking: Reported on 04/01/2015) 10 tablet 0  . HYDROcodone-homatropine (HYCODAN) 5-1.5 MG/5ML syrup Take 5 mLs by mouth every 6 (six) hours as needed for cough. (Patient not taking: Reported on 04/01/2015) 180 mL 0  . oseltamivir (TAMIFLU) 75 MG capsule Take 1 capsule (75 mg total) by mouth 2 (two) times daily. (Patient not taking: Reported on 04/01/2015) 8 capsule 0   No current facility-administered medications on file prior to visit.   Review of Systems  Constitutional: Negative for unusual diaphoresis or night sweats HENT: Negative for ringing in ear or  discharge Eyes: Negative for double vision or worsening visual disturbance.  Respiratory: Negative for choking and stridor.   Gastrointestinal: Negative for vomiting or other signifcant bowel change Genitourinary: Negative for hematuria or change in urine volume.  Musculoskeletal: Negative for other MSK pain or swelling Skin: Negative for color change and worsening wound.  Neurological: Negative for tremors and numbness other than noted  Psychiatric/Behavioral: Negative for decreased concentration or agitation other than above       Objective:   Physical Exam BP 134/72 mmHg  Pulse 73  Temp(Src) 97.3 F (36.3 C) (Oral)  Ht 5\' 6"  (1.676 m)  Wt 268 lb (121.564 kg)  BMI 43.28 kg/m2  SpO2 96% VS noted,  Constitutional: Pt appears in no significant distress HENT: Head: NCAT.  Right Ear: External ear normal.  Left Ear: External ear normal.  Eyes: . Pupils are equal, round, and reactive to light. Conjunctivae and EOM are normal Neck: Normal range of motion. Neck supple.  Cardiovascular: Normal rate and regular rhythm.   Pulmonary/Chest: Effort normal and breath sounds without rales or wheezing.  Neurological: Pt is alert. Not confused , motor grossly intact Skin: Skin is warm. No rash, no LE edema Psychiatric: Pt behavior is normal. No agitation.     Assessment & Plan:

## 2015-04-01 NOTE — Progress Notes (Signed)
Pre visit review using our clinic review tool, if applicable. No additional management support is needed unless otherwise documented below in the visit note. 

## 2015-04-01 NOTE — Assessment & Plan Note (Signed)
Asympt, ok for f/u with hx of BPH related UTI,  to f/u any worsening symptoms or concerns

## 2015-04-01 NOTE — Assessment & Plan Note (Signed)
Clinically resolved, no further antix needed

## 2015-04-01 NOTE — Patient Instructions (Signed)
Please continue all other medications as before, and no further antibiotic is needed at this time  Please have the pharmacy call with any other refills you may need.  Please continue your efforts at being more active, low cholesterol diet, and weight control.  Please keep your appointments with your specialists as you may have planned  Please go to the LAB in the Basement (turn left off the elevator) for the tests to be done today  You will be contacted by phone if any changes need to be made immediately.  Otherwise, you will receive a letter about your results with an explanation, but please check with MyChart first.  Please remember to sign up for MyChart if you have not done so, as this will be important to you in the future with finding out test results, communicating by private email, and scheduling acute appointments online when needed.

## 2015-04-02 LAB — URINE CULTURE
Colony Count: NO GROWTH
ORGANISM ID, BACTERIA: NO GROWTH

## 2015-05-28 ENCOUNTER — Ambulatory Visit (INDEPENDENT_AMBULATORY_CARE_PROVIDER_SITE_OTHER): Payer: Medicare Other | Admitting: Internal Medicine

## 2015-05-28 ENCOUNTER — Encounter: Payer: Self-pay | Admitting: Internal Medicine

## 2015-05-28 VITALS — BP 148/84 | HR 73 | Temp 98.5°F | Resp 20 | Wt 267.0 lb

## 2015-05-28 DIAGNOSIS — M25561 Pain in right knee: Secondary | ICD-10-CM

## 2015-05-28 DIAGNOSIS — Z1159 Encounter for screening for other viral diseases: Secondary | ICD-10-CM | POA: Diagnosis not present

## 2015-05-28 DIAGNOSIS — Z0000 Encounter for general adult medical examination without abnormal findings: Secondary | ICD-10-CM | POA: Diagnosis not present

## 2015-05-28 DIAGNOSIS — Z23 Encounter for immunization: Secondary | ICD-10-CM | POA: Diagnosis not present

## 2015-05-28 DIAGNOSIS — E785 Hyperlipidemia, unspecified: Secondary | ICD-10-CM

## 2015-05-28 DIAGNOSIS — I10 Essential (primary) hypertension: Secondary | ICD-10-CM | POA: Diagnosis not present

## 2015-05-28 DIAGNOSIS — M25562 Pain in left knee: Secondary | ICD-10-CM

## 2015-05-28 NOTE — Assessment & Plan Note (Signed)
stable overall by history and exam, recent data reviewed with pt, and pt to continue medical treatment as before,  to f/u any worsening symptoms or concerns BP Readings from Last 3 Encounters:  05/28/15 148/84  04/01/15 134/72  03/25/15 144/66

## 2015-05-28 NOTE — Assessment & Plan Note (Signed)
Overall doing well, age appropriate education and counseling updated, referrals for preventative services and immunizations addressed, dietary and smoking counseling addressed, most recent labs reviewed.  I have personally reviewed and have noted:  1) the patient's medical and social history 2) The pt's use of alcohol, tobacco, and illicit drugs 3) The patient's current medications and supplements 4) Functional ability including ADL's, fall risk, home safety risk, hearing and visual impairment 5) Diet and physical activities 6) Evidence for depression or mood disorder 7) The patient's height, weight, and BMI have been recorded in the chart  I have made referrals, and provided counseling and education based on review of the above Lab Results  Component Value Date   WBC 8.2 04/01/2015   HGB 13.7 04/01/2015   HCT 42.2 04/01/2015   PLT 220.0 04/01/2015   GLUCOSE 101* 03/25/2015   CHOL 135 11/27/2014   TRIG 65.0 11/27/2014   HDL 36.90* 11/27/2014   LDLDIRECT 185.6 05/15/2007   LDLCALC 85 11/27/2014   ALT 76* 03/25/2015   AST 27 03/25/2015   NA 145 03/25/2015   K 3.7 03/25/2015   CL 112* 03/25/2015   CREATININE 1.02 03/25/2015   BUN 10 03/25/2015   CO2 25 03/25/2015   TSH 1.30 11/27/2014   PSA 5.25* 03/05/2013   Declines further lab today

## 2015-05-28 NOTE — Progress Notes (Signed)
Pre visit review using our clinic review tool, if applicable. No additional management support is needed unless otherwise documented below in the visit note. 

## 2015-05-28 NOTE — Patient Instructions (Addendum)
You had the Tetanus shot today (Tdap)  Please continue all other medications as before, and refills have been done if requested.  Please have the pharmacy call with any other refills you may need.  Please continue your efforts at being more active, low cholesterol diet, and weight control.  You are otherwise up to date with prevention measures today.  Please keep your appointments with your specialists as you may have planned  Please return in 6 months, or sooner if needed, with Lab testing done 3-5 days before

## 2015-05-28 NOTE — Assessment & Plan Note (Signed)
End stage, for cont'd pain control, f/u ortho, advised pt would be good candidate for surgury

## 2015-05-28 NOTE — Assessment & Plan Note (Signed)
stable overall by history and exam, recent data reviewed with pt, and pt to continue medical treatment as before,  to f/u any worsening symptoms or concerns Lab Results  Component Value Date   [place] 85 11/27/2014   Will ask for labs next visit

## 2015-05-28 NOTE — Progress Notes (Signed)
Subjective:    Patient ID: Aaron Moss, male    DOB: September 02, 1945, 70 y.o.   MRN: [id number]  HPI  Here for wellness and f/u;  Overall doing ok;  Pt denies Chest pain, worsening SOB, DOE, wheezing, orthopnea, PND, worsening LE edema, palpitations, dizziness or syncope.  Pt denies neurological change such as new headache, facial or extremity weakness.  Pt denies polydipsia, polyuria, or low sugar symptoms. Pt states overall good compliance with treatment and medications, good tolerability, and has been trying to follow appropriate diet.  Pt denies worsening depressive symptoms, suicidal ideation or panic. No fever, night sweats, wt loss, loss of appetite, or other constitutional symptoms.  Pt states good ability with ADL's, has low fall risk, home safety reviewed and adequate, no other significant changes in hearing or vision, and only occasionally active with exercise, plans to do more soon. BP was always better off when was excerciseing.  Does not want further BP med. Wt Readings from Last 3 Encounters:  05/28/15 267 lb (121.11 kg)  04/01/15 268 lb (121.564 kg)  03/25/15 263 lb 8 oz (119.523 kg)   BP Readings from Last 3 Encounters:  05/28/15 148/84  04/01/15 134/72  03/25/15 144/66  Gets PSA regularly with urology - last at 0.03 per pt.  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  Liimps to walk, no swelling or giveaways or ralls recent Past Medical History  Diagnosis Date  . Hypertension   . Hyperlipidemia   . History of colon cancer   . Left knee DJD   . Diverticulosis of colon   . 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 Colon cancer ([place]) 2000  . History of chemotherapy 2000    colon cancer  . History of radiation therapy 08/06/13- 10/03/13    prostate 7800 cGy 40 sessions, seminal vesicles 5600 cGy 40 sessions   Past Surgical History  Procedure Laterality Date  . Colon surgery  2000    colon  cancer  . Knee arthroscopy  2007    LT- [facility]  . Colonoscopy    . Eus      pancreatic cyst  . Prostate biopsy  05/23/13    gleason 7, volume 11.5 ml    reports that he quit smoking about 27 years ago. His smoking use included Cigarettes. He has a 22 pack-year smoking history. He has never used smokeless tobacco. He reports that he drinks alcohol. He reports that he does not use illicit drugs. family history includes Cancer in his brother, maternal grandmother, and mother; Colon cancer in his sister; Diabetes in his brother. Allergies  Allergen Reactions  . Pregabalin     REACTION: felt bad   Current Outpatient Prescriptions on File Prior to Visit  Medication Sig Dispense Refill  . amLODipine (NORVASC) 10 MG tablet Take 1 tablet (10 mg total) by mouth daily. 90 tablet 3  . aspirin EC 81 MG tablet Take 81 mg by mouth daily.      Marland Kitchen atorvastatin (LIPITOR) 40 MG tablet Take 1 tablet (40 mg total) by mouth daily. 90 tablet 3  . benazepril (LOTENSIN) 40 MG tablet Take 1 tablet (40 mg total) by mouth daily. 90 tablet 3  . diclofenac (VOLTAREN) 75 MG EC tablet TAKE ONE TABLET BY MOUTH TWICE DAILY AS NEEDED FOR PAIN 60 tablet 5  . hydrochlorothiazide (HYDRODIURIL) 25 MG tablet Take 1 tablet (25 mg total)  by mouth daily. 100 tablet 3  . potassium chloride (K-DUR) 10 MEQ tablet Take 1 tablet (10 mEq total) by mouth daily. 90 tablet 3  . traMADol (ULTRAM) 50 MG tablet TAKE ONE TABLET BY MOUTH EVERY 6 HOURS AS NEEDED FOR PAIN 120 tablet 2   No current facility-administered medications on file prior to visit.   Review of Systems  Constitutional: Negative for increased diaphoresis, other activity, appetite or siginficant weight change other than noted HENT: Negative for worsening hearing loss, ear pain, facial swelling, mouth sores and neck stiffness.   Eyes: Negative for other worsening pain, redness or visual disturbance.  Respiratory: Negative for shortness of breath and wheezing    Cardiovascular: Negative for chest pain and palpitations.  Gastrointestinal: Negative for diarrhea, blood in stool, abdominal distention or other pain Genitourinary: Negative for hematuria, flank pain or change in urine volume.  Musculoskeletal: Negative for myalgias or other joint complaints.  Skin: Negative for color change and wound or drainage.  Neurological: Negative for syncope and numbness. other than noted Hematological: Negative for adenopathy. or other swelling Psychiatric/Behavioral: Negative for hallucinations, SI, self-injury, decreased concentration or other worsening agitation.      Objective:   Physical Exam BP 148/84 mmHg  Pulse 73  Temp(Src) 98.5 F (36.9 C) (Oral)  Resp 20  Wt 267 lb (121.11 kg)  SpO2 96% VS noted,  Constitutional: Pt is oriented to person, place, and time. Appears well-developed and well-nourished, in no significant distress Head: Normocephalic and atraumatic.  Right Ear: External ear normal.  Left Ear: External ear normal.  Nose: Nose normal.  Mouth/Throat: Oropharynx is clear and moist.  Eyes: Conjunctivae and EOM are normal. Pupils are equal, round, and reactive to light.  Neck: Normal range of motion. Neck supple. No JVD present. No tracheal deviation present or significant neck LA or mass Cardiovascular: Normal rate, regular rhythm, normal heart sounds and intact distal pulses.   Pulmonary/Chest: Effort normal and breath sounds without rales or wheezing  Abdominal: Soft. Bowel sounds are normal. NT. No HSM  Musculoskeletal: Normal range of motion. Exhibits no edema.  Lymphadenopathy:  Has no cervical adenopathy.  Neurological: Pt is alert and oriented to person, place, and time. Pt has normal reflexes. No cranial nerve deficit. Motor grossly intact Skin: Skin is warm and dry. No rash noted.  Psychiatric:  Has normal mood and affect. Behavior is normal.  Left knee with marked crepitus, reduced ROM, no effusion       Assessment &  Plan:

## 2015-05-31 ENCOUNTER — Other Ambulatory Visit: Payer: Self-pay | Admitting: Internal Medicine

## 2015-05-31 NOTE — Telephone Encounter (Signed)
rx faxed to pharmacy

## 2015-05-31 NOTE — Telephone Encounter (Signed)
Done hardcopy to Corinne  

## 2015-05-31 NOTE — Telephone Encounter (Signed)
Please advise, ok to refill?

## 2015-06-21 ENCOUNTER — Other Ambulatory Visit: Payer: Self-pay | Admitting: Internal Medicine

## 2015-07-29 ENCOUNTER — Ambulatory Visit (INDEPENDENT_AMBULATORY_CARE_PROVIDER_SITE_OTHER): Payer: Medicare Other | Admitting: Internal Medicine

## 2015-07-29 ENCOUNTER — Encounter: Payer: Self-pay | Admitting: Internal Medicine

## 2015-07-29 VITALS — BP 140/82 | HR 88 | Temp 98.4°F | Resp 20 | Wt 269.0 lb

## 2015-07-29 DIAGNOSIS — I10 Essential (primary) hypertension: Secondary | ICD-10-CM | POA: Diagnosis not present

## 2015-07-29 DIAGNOSIS — R05 Cough: Secondary | ICD-10-CM

## 2015-07-29 DIAGNOSIS — J309 Allergic rhinitis, unspecified: Secondary | ICD-10-CM | POA: Diagnosis not present

## 2015-07-29 DIAGNOSIS — R059 Cough, unspecified: Secondary | ICD-10-CM

## 2015-07-29 MED ORDER — TRIAMCINOLONE ACETONIDE 55 MCG/ACT NA AERO: 2.0000 | INHALATION_SPRAY | Freq: Every day | NASAL | Status: DC

## 2015-07-29 MED ORDER — CETIRIZINE HCL 10 MG PO TABS: 10.0000 mg | ORAL_TABLET | Freq: Every day | ORAL | Status: DC

## 2015-07-29 MED ORDER — PREDNISONE 10 MG PO TABS
ORAL_TABLET | ORAL | Status: DC
Start: 2015-07-29 — End: 2015-11-30

## 2015-07-29 NOTE — Patient Instructions (Signed)
You had the steroid shot today  Please take all new medication as prescribed - the prednisone, zyrtec and nasacort as directed  Please continue all other medications as before, and refills have been done if requested.  Please have the pharmacy call with any other refills you may need.  Please keep your appointments with your specialists as you may have planned

## 2015-07-29 NOTE — Assessment & Plan Note (Signed)
Mild to mod, with seasonal flare new onset, no s/s infection so no need antibx course,  But but depomedrol IM, predpac asd, zyrtec/nasacort asd, to f/u any worsening symptoms or concerns

## 2015-07-29 NOTE — Progress Notes (Signed)
Subjective:    Patient ID: Aaron Moss, male    DOB: May 20, 1945, 70 y.o.   MRN: [id number]  HPI Here to f/u; overall doing ok,  Pt denies chest pain, increasing sob or doe, wheezing, orthopnea, PND, increased LE swelling, palpitations, dizziness or syncope.  Pt denies new neurological symptoms such as new headache, or facial or extremity weakness or numbness.  Pt denies polydipsia, polyuria, o   Pt denies new neurological symptoms such as new headache, or facial or extremity weakness or numbness.   Pt states overall good compliance with meds, mostly trying to follow appropriate diet.  Does have several wks ongoing nasal allergy symptoms with clearish congestion, itch and sneezing and marked worsening cough in last few wks, very difficult to sleep as cough seems worse to lie down,  but without fever, pain, ST, swelling or wheezing. Past Medical History  Diagnosis Date  . Hypertension   . Hyperlipidemia   . History of colon cancer   . Left knee DJD   . Diverticulosis of colon   . 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 Colon cancer ([place]) 2000  . History of chemotherapy 2000    colon cancer  . History of radiation therapy 08/06/13- 10/03/13    prostate 7800 cGy 40 sessions, seminal vesicles 5600 cGy 40 sessions   Past Surgical History  Procedure Laterality Date  . Colon surgery  2000    colon cancer  . Knee arthroscopy  2007    LT- [facility]  . Colonoscopy    . Eus      pancreatic cyst  . Prostate biopsy  05/23/13    gleason 7, volume 11.5 ml    reports that he quit smoking about 27 years ago. His smoking use included Cigarettes. He has a 22 pack-year smoking history. He has never used smokeless tobacco. He reports that he drinks alcohol. He reports that he does not use illicit drugs. family history includes Cancer in his brother, maternal grandmother, and mother; Colon cancer in his sister; Diabetes in his brother. Allergies  Allergen Reactions  .  Pregabalin     REACTION: felt bad   Current Outpatient Prescriptions on File Prior to Visit  Medication Sig Dispense Refill  . amLODipine (NORVASC) 10 MG tablet Take 1 tablet (10 mg total) by mouth daily. 90 tablet 3  . aspirin EC 81 MG tablet Take 81 mg by mouth daily.      Marland Kitchen atorvastatin (LIPITOR) 40 MG tablet Take 1 tablet (40 mg total) by mouth daily. 90 tablet 3  . benazepril (LOTENSIN) 40 MG tablet Take 1 tablet (40 mg total) by mouth daily. 90 tablet 3  . diclofenac (VOLTAREN) 75 MG EC tablet TAKE ONE TABLET BY MOUTH TWICE DAILY AS NEEDED FOR PAIN 60 tablet 0  . hydrochlorothiazide (HYDRODIURIL) 25 MG tablet Take 1 tablet (25 mg total) by mouth daily. 100 tablet 3  . potassium chloride (K-DUR) 10 MEQ tablet Take 1 tablet (10 mEq total) by mouth daily. 90 tablet 3  . traMADol (ULTRAM) 50 MG tablet TAKE ONE TABLET BY MOUTH EVERY 6 HOURS AS NEEDED FOR PAIN 120 tablet 2   No current facility-administered medications on file prior to visit.    Review of Systems  Constitutional: Negative for unusual diaphoresis or night sweats HENT: Negative for ear swelling or discharge Eyes: Negative for worsening visual haziness  Respiratory: Negative for choking and stridor.   Gastrointestinal: Negative for distension or  worsening eructation Genitourinary: Negative for retention or change in urine volume.  Musculoskeletal: Negative for other MSK pain or swelling Skin: Negative for color change and worsening wound Neurological: Negative for tremors and numbness other than noted  Psychiatric/Behavioral: Negative for decreased concentration or agitation other than above       Objective:   Physical Exam BP 140/82 mmHg  Pulse 88  Temp(Src) 98.4 F (36.9 C) (Oral)  Resp 20  Wt 269 lb (122.018 kg)  SpO2 96% VS noted,  Constitutional: Pt appears in no apparent distress HENT: Head: NCAT.  Right Ear: External ear normal.  Left Ear: External ear normal.  Eyes: . Pupils are equal, round, and  reactive to light. Conjunctivae and EOM are normal Neck: Normal range of motion. Neck supple.  Cardiovascular: Normal rate and regular rhythm.   Pulmonary/Chest: Effort normal and breath sounds without rales or wheezing.  Bilat tm's with mild erythema.  Max sinus areas non tender.  Pharynx with mild erythema, no exudate Neurological: Pt is alert. Not confused , motor grossly intact Skin: Skin is warm. No rash, no LE edema Psychiatric: Pt behavior is normal. No agitation.     Assessment & Plan:

## 2015-07-29 NOTE — Assessment & Plan Note (Signed)
stable overall by history and exam, recent data reviewed with pt, and pt to continue medical treatment as before,  to f/u any worsening symptoms or concerns BP Readings from Last 3 Encounters:  07/29/15 140/82  05/28/15 148/84  04/01/15 134/72   Pt to cont efforts at exercise, wt loss

## 2015-07-29 NOTE — Progress Notes (Signed)
Pre visit review using our clinic review tool, if applicable. No additional management support is needed unless otherwise documented below in the visit note. 

## 2015-07-29 NOTE — Assessment & Plan Note (Signed)
No fever, exam benign , recent cxr/ct angio neg for malignancy jan 2017, thought he is concerned about recurrent pna today, ok to use delsym otc prn, would re-check cxr only if symtpoms not improved with empiric tx in the next few days or week

## 2015-08-10 ENCOUNTER — Other Ambulatory Visit: Payer: Self-pay | Admitting: Internal Medicine

## 2015-10-05 ENCOUNTER — Other Ambulatory Visit: Payer: Self-pay | Admitting: Internal Medicine

## 2015-11-04 ENCOUNTER — Other Ambulatory Visit: Payer: Self-pay | Admitting: Internal Medicine

## 2015-11-04 NOTE — Telephone Encounter (Signed)
Medication refill sent to pharmacy  

## 2015-11-04 NOTE — Telephone Encounter (Signed)
voltaren done erx  Tramadol Done hardcopy to [facility]

## 2015-11-21 ENCOUNTER — Other Ambulatory Visit: Payer: Self-pay | Admitting: Internal Medicine

## 2015-11-30 ENCOUNTER — Encounter: Payer: Self-pay | Admitting: Internal Medicine

## 2015-11-30 ENCOUNTER — Ambulatory Visit (INDEPENDENT_AMBULATORY_CARE_PROVIDER_SITE_OTHER): Payer: Medicare Other | Admitting: Internal Medicine

## 2015-11-30 VITALS — BP 140/78 | HR 87 | Temp 98.2°F | Resp 20 | Wt 269.0 lb

## 2015-11-30 DIAGNOSIS — I10 Essential (primary) hypertension: Secondary | ICD-10-CM

## 2015-11-30 DIAGNOSIS — Z23 Encounter for immunization: Secondary | ICD-10-CM | POA: Diagnosis not present

## 2015-11-30 DIAGNOSIS — E785 Hyperlipidemia, unspecified: Secondary | ICD-10-CM

## 2015-11-30 DIAGNOSIS — R6889 Other general symptoms and signs: Secondary | ICD-10-CM

## 2015-11-30 DIAGNOSIS — Z0001 Encounter for general adult medical examination with abnormal findings: Secondary | ICD-10-CM

## 2015-11-30 DIAGNOSIS — J309 Allergic rhinitis, unspecified: Secondary | ICD-10-CM

## 2015-11-30 DIAGNOSIS — Z1159 Encounter for screening for other viral diseases: Secondary | ICD-10-CM

## 2015-11-30 NOTE — Progress Notes (Signed)
Subjective:    Patient ID: Aaron Moss, male    DOB: Dec 03, 1945, 70 y.o.   MRN: [id number]  HPI   Here to f/u; overall doing ok,  Pt denies chest pain, increasing sob or doe, wheezing, orthopnea, PND, increased LE swelling, palpitations, dizziness or syncope.  Pt denies new neurological symptoms such as new headache, or facial or extremity weakness or numbness.  Pt denies polydipsia, polyuria, or low sugar episode.   Pt denies new neurological symptoms such as new headache, or facial or extremity weakness or numbness.   Pt states overall good compliance with meds, mostly trying to follow appropriate diet, with wt overall stable,  but little exercise however. Wt Readings from Last 3 Encounters:  11/30/15 269 lb (122 kg)  07/29/15 269 lb (122 kg)  05/28/15 267 lb (121.1 kg)   BP Readings from Last 3 Encounters:  11/30/15 140/78  07/29/15 140/82  05/28/15 (!) 148/84  Does have several wks ongoing nasal allergy symptoms with clearish congestion, itch and sneezing, without fever, pain, ST, cough, swelling or wheezing, but better in last few days with restart nasacort Past Medical History:  Diagnosis Date  . Colon cancer ([place]) 2000  . Diverticulosis of colon   . History of chemotherapy 2000   colon cancer  . History of colon cancer   . History of radiation therapy 08/06/13- 10/03/13   prostate 7800 cGy 40 sessions, seminal vesicles 5600 cGy 40 sessions  . Hyperlipidemia   . Hypertension   . Left knee DJD   . 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 COLON SURGERY  2000   colon cancer  . COLONOSCOPY    . EUS     pancreatic cyst  . KNEE ARTHROSCOPY  2007   LT- [facility]  . PROSTATE BIOPSY  05/23/13   gleason 7, volume 11.5 ml    reports that he quit smoking about 27 years ago. His smoking use included Cigarettes. He has a 22.00 pack-year smoking history. He has never used smokeless tobacco. He reports that  he drinks alcohol. He reports that he does not use drugs. family history includes Cancer in his brother, maternal grandmother, and mother; Colon cancer in his sister; Diabetes in his brother. Allergies  Allergen Reactions  . Pregabalin     REACTION: felt bad   Current Outpatient Prescriptions on File Prior to Visit  Medication Sig Dispense Refill  . amLODipine (NORVASC) 10 MG tablet Take 1 tablet (10 mg total) by mouth daily. 90 tablet 3  . aspirin EC 81 MG tablet Take 81 mg by mouth daily.      Marland Kitchen atorvastatin (LIPITOR) 40 MG tablet Take 1 tablet (40 mg total) by mouth daily. 90 tablet 3  . benazepril (LOTENSIN) 40 MG tablet TAKE ONE TABLET BY MOUTH ONCE DAILY 90 tablet 3  . cetirizine (ZYRTEC) 10 MG tablet Take 1 tablet (10 mg total) by mouth daily. 30 tablet 11  . diclofenac (VOLTAREN) 75 MG EC tablet TAKE ONE TABLET BY MOUTH TWICE DAILY AS NEEDED FOR PAIN 60 tablet 5  . hydrochlorothiazide (HYDRODIURIL) 25 MG tablet Take 1 tablet (25 mg total) by mouth daily. 100 tablet 3  . potassium chloride (K-DUR) 10 MEQ tablet Take 1 tablet (10 mEq total) by mouth daily. 90 tablet 3  . traMADol (ULTRAM) 50 MG tablet TAKE ONE TABLET BY MOUTH EVERY 6 HOURS AS NEEDED FOR PAIN 120 tablet  2  . triamcinolone (NASACORT AQ) 55 MCG/ACT AERO nasal inhaler Place 2 sprays into the nose daily. 1 Inhaler 12   No current facility-administered medications on file prior to visit.    Review of Systems  Constitutional: Negative for unusual diaphoresis or night sweats HENT: Negative for ear swelling or discharge Eyes: Negative for worsening visual haziness  Respiratory: Negative for choking and stridor.   Gastrointestinal: Negative for distension or worsening eructation Genitourinary: Negative for retention or change in urine volume.  Musculoskeletal: Negative for other MSK pain or swelling Skin: Negative for color change and worsening wound Neurological: Negative for tremors and numbness other than noted    Psychiatric/Behavioral: Negative for decreased concentration or agitation other than above       Objective:   Physical Exam BP 140/78   Pulse 87   Temp 98.2 F (36.8 C) (Oral)   Resp 20   Wt 269 lb (122 kg)   SpO2 96%   BMI 43.42 kg/m  VS noted,  Constitutional: Pt appears in no apparent distress HENT: Head: NCAT.  Right Ear: External ear normal.  Left Ear: External ear normal.  Eyes: . Pupils are equal, round, and reactive to light. Conjunctivae and EOM are normal Neck: Normal range of motion. Neck supple.  Cardiovascular: Normal rate and regular rhythm.   Pulmonary/Chest: Effort normal and breath sounds without rales or wheezing.  Abd:  Soft, NT, ND, + BS Neurological: Pt is alert. Not confused , motor grossly intact Skin: Skin is warm. No rash, no LE edema Psychiatric: Pt behavior is normal. No agitation.      Assessment & Plan:

## 2015-11-30 NOTE — Patient Instructions (Signed)
You had the flu shot today  Please continue all other medications as before, and refills have been done if requested.  Please have the pharmacy call with any other refills you may need.  Please continue your efforts at being more active, low cholesterol diet, and weight control.  Please keep your appointments with your specialists as you may have planned  Please return in 6 months, or sooner if needed, with Lab testing done 3-5 days before  

## 2015-11-30 NOTE — Progress Notes (Signed)
Pre visit review using our clinic review tool, if applicable. No additional management support is needed unless otherwise documented below in the visit note. 

## 2015-12-06 NOTE — Assessment & Plan Note (Signed)
[place] for [employer] asd,  to f/u any worsening symptoms or concerns

## 2015-12-06 NOTE — Assessment & Plan Note (Signed)
.  stable overall by history and exam, recent data reviewed with pt, and pt to continue medical treatment as before,  to f/u any worsening symptoms or concerns BP Readings from Last 3 Encounters:  11/30/15 140/78  07/29/15 140/82  05/28/15 (!) 148/84

## 2015-12-06 NOTE — Assessment & Plan Note (Signed)
stable overall by history and exam, recent data reviewed with pt, and pt to continue medical treatment as before,  to f/u any worsening symptoms or concerns Lab Results  Component Value Date   [place] 85 11/27/2014

## 2015-12-13 ENCOUNTER — Other Ambulatory Visit: Payer: Self-pay | Admitting: Internal Medicine

## 2015-12-21 ENCOUNTER — Other Ambulatory Visit: Payer: Self-pay | Admitting: Internal Medicine

## 2016-01-27 ENCOUNTER — Other Ambulatory Visit: Payer: Self-pay | Admitting: Internal Medicine

## 2016-02-08 ENCOUNTER — Other Ambulatory Visit: Payer: Self-pay | Admitting: Internal Medicine

## 2016-02-22 ENCOUNTER — Ambulatory Visit: Payer: Medicare Other | Admitting: Internal Medicine

## 2016-04-20 ENCOUNTER — Other Ambulatory Visit: Payer: Self-pay | Admitting: Internal Medicine

## 2016-04-20 NOTE — Telephone Encounter (Signed)
faxed

## 2016-04-20 NOTE — Telephone Encounter (Signed)
Done hardcopy to Corinne  

## 2016-05-30 ENCOUNTER — Ambulatory Visit: Payer: Medicare Other | Admitting: Internal Medicine

## 2016-06-01 ENCOUNTER — Encounter: Payer: Self-pay | Admitting: Internal Medicine

## 2016-06-01 ENCOUNTER — Other Ambulatory Visit (INDEPENDENT_AMBULATORY_CARE_PROVIDER_SITE_OTHER): Payer: Medicare Other

## 2016-06-01 ENCOUNTER — Ambulatory Visit (INDEPENDENT_AMBULATORY_CARE_PROVIDER_SITE_OTHER): Payer: Medicare Other | Admitting: Internal Medicine

## 2016-06-01 DIAGNOSIS — Z0000 Encounter for general adult medical examination without abnormal findings: Secondary | ICD-10-CM

## 2016-06-01 DIAGNOSIS — I10 Essential (primary) hypertension: Secondary | ICD-10-CM | POA: Diagnosis not present

## 2016-06-01 DIAGNOSIS — Z1159 Encounter for screening for other viral diseases: Secondary | ICD-10-CM

## 2016-06-01 LAB — URINALYSIS, ROUTINE W REFLEX MICROSCOPIC
Bilirubin Urine: NEGATIVE
Ketones, ur: NEGATIVE
Leukocytes, UA: NEGATIVE
Nitrite: NEGATIVE
RBC / HPF: NONE SEEN (ref 0–?)
Specific Gravity, Urine: 1.02 (ref 1.000–1.030)
TOTAL PROTEIN, URINE-UPE24: NEGATIVE
Urine Glucose: NEGATIVE
Urobilinogen, UA: 0.2 (ref 0.0–1.0)
pH: 5.5 (ref 5.0–8.0)

## 2016-06-01 LAB — BASIC METABOLIC PANEL
BUN: 17 mg/dL (ref 6–23)
CO2: 30 mEq/L (ref 19–32)
Calcium: 9.7 mg/dL (ref 8.4–10.5)
Chloride: 104 mEq/L (ref 96–112)
Creatinine, Ser: 1.22 mg/dL (ref 0.40–1.50)
GFR: 74.99 mL/min (ref >60.00)
Glucose, Bld: 91 mg/dL (ref 70–99)
POTASSIUM: 3.2 meq/L — AB (ref 3.5–5.1)
Sodium: 144 mEq/L (ref 135–145)

## 2016-06-01 LAB — HEPATIC FUNCTION PANEL
ALT: 12 U/L (ref 0–53)
AST: 14 U/L (ref 0–37)
Albumin: 4 g/dL (ref 3.5–5.2)
Alkaline Phosphatase: 114 U/L (ref 39–117)
BILIRUBIN DIRECT: 0.2 mg/dL (ref 0.0–0.3)
Total Bilirubin: 1.4 mg/dL — ABNORMAL HIGH (ref 0.2–1.2)
Total Protein: 7.3 g/dL (ref 6.0–8.3)

## 2016-06-01 LAB — CBC WITH DIFFERENTIAL/PLATELET
BASOS PCT: 0.9 % (ref 0.0–3.0)
Basophils Absolute: 0.1 10*3/uL (ref 0.0–0.1)
EOS ABS: 0.2 10*3/uL (ref 0.0–0.7)
Eosinophils Relative: 2.5 % (ref 0.0–5.0)
HEMATOCRIT: 44.4 % (ref 39.0–52.0)
Hemoglobin: 14.5 g/dL (ref 13.0–17.0)
Lymphocytes Relative: 23.6 % (ref 12.0–46.0)
Lymphs Abs: 1.8 10*3/uL (ref 0.7–4.0)
MCHC: 32.7 g/dL (ref 30.0–36.0)
MCV: 77.7 fl — ABNORMAL LOW (ref 78.0–100.0)
MONO ABS: 0.5 10*3/uL (ref 0.1–1.0)
Monocytes Relative: 6.1 % (ref 3.0–12.0)
Neutro Abs: 5 10*3/uL (ref 1.4–7.7)
Neutrophils Relative %: 66.9 % (ref 43.0–77.0)
Platelets: 227 10*3/uL (ref 150.0–400.0)
RBC: 5.71 Mil/uL (ref 4.22–5.81)
RDW: 16.9 % — AB (ref 11.5–15.5)
WBC: 7.4 10*3/uL (ref 4.0–10.5)

## 2016-06-01 LAB — LIPID PANEL
CHOL/HDL RATIO: 5
CHOLESTEROL: 166 mg/dL (ref 0–200)
HDL: 32.8 mg/dL — AB (ref >39.00)
LDL CALC: 109 mg/dL — AB (ref 0–99)
NONHDL: 133.49
Triglycerides: 124 mg/dL (ref 0.0–149.0)
VLDL: 24.8 mg/dL (ref 0.0–40.0)

## 2016-06-01 LAB — PSA: PSA: 0.16 ng/mL (ref 0.10–4.00)

## 2016-06-01 LAB — TSH: TSH: 1.29 u[IU]/mL (ref 0.35–4.50)

## 2016-06-01 MED ORDER — POTASSIUM CHLORIDE ER 10 MEQ PO TBCR: 20.0000 meq | EXTENDED_RELEASE_TABLET | Freq: Every day | ORAL | 3 refills | Status: DC

## 2016-06-01 NOTE — Assessment & Plan Note (Signed)

## 2016-06-01 NOTE — Progress Notes (Signed)
Subjective:    Patient ID: Aaron Moss, male    DOB: 1946-02-22, 71 y.o.   MRN: [id number]  HPI   Here for wellness and f/u;  Overall doing ok;  Pt denies Chest pain, worsening SOB, DOE, wheezing, orthopnea, PND, worsening LE edema, palpitations, dizziness or syncope.  Pt denies neurological change such as new headache, facial or extremity weakness.  Pt denies polydipsia, polyuria, or low sugar symptoms. Pt states overall good compliance with treatment and medications, good tolerability, and has been trying to follow appropriate diet.  Pt denies worsening depressive symptoms, suicidal ideation or panic. No fever, night sweats, wt loss, loss of appetite, or other constitutional symptoms.  Pt states good ability with ADL's, has low fall risk, home safety reviewed and adequate, no other significant changes in hearing or vision, and only occasionally active with exercise.  Does c/o ongoing fatigue, but denies signficant daytime hypersomnolence.  Has chronic Left knee pain, sees [facility], last cortoisone approx 4 mo ago, and getting close to needing another, may need to have replacement eventually. Wt Readings from Last 3 Encounters:  06/01/16 264 lb (119.7 kg)  11/30/15 269 lb (122 kg)  07/29/15 269 lb (122 kg)   Past Medical History:  Diagnosis Date  . Colon cancer ([place]) 2000  . Diverticulosis of colon   . History of chemotherapy 2000   colon cancer  . History of colon cancer   . History of radiation therapy 08/06/13- 10/03/13   prostate 7800 cGy 40 sessions, seminal vesicles 5600 cGy 40 sessions  . Hyperlipidemia   . Hypertension   . Left knee DJD   . 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 COLON SURGERY  2000   colon cancer  . COLONOSCOPY    . EUS     pancreatic cyst  . KNEE ARTHROSCOPY  2007   LT- [facility]  . PROSTATE BIOPSY  05/23/13   gleason 7, volume 11.5 ml    reports that he quit smoking  about 28 years ago. His smoking use included Cigarettes. He has a 22.00 pack-year smoking history. He has never used smokeless tobacco. He reports that he drinks alcohol. He reports that he does not use drugs. family history includes Cancer in his brother, maternal grandmother, and mother; Colon cancer in his sister; Diabetes in his brother. Allergies  Allergen Reactions  . Pregabalin     REACTION: felt bad   Current Outpatient Prescriptions on File Prior to Visit  Medication Sig Dispense Refill  . amLODipine (NORVASC) 10 MG tablet TAKE ONE TABLET BY MOUTH DAILY. 90 tablet 3  . aspirin EC 81 MG tablet Take 81 mg by mouth daily.      Marland Kitchen atorvastatin (LIPITOR) 40 MG tablet TAKE ONE TABLET BY MOUTH DAILY. 90 tablet 3  . benazepril (LOTENSIN) 40 MG tablet TAKE ONE TABLET BY MOUTH ONCE DAILY 90 tablet 3  . cetirizine (ZYRTEC) 10 MG tablet Take 1 tablet (10 mg total) by mouth daily. 30 tablet 11  . diclofenac (VOLTAREN) 75 MG EC tablet TAKE ONE TABLET BY MOUTH TWICE DAILY AS NEEDED FOR PAIN 60 tablet 5  . hydrochlorothiazide (HYDRODIURIL) 25 MG tablet TAKE ONE TABLET BY MOUTH ONCE DAILY 40 tablet 9  . potassium chloride (K-DUR) 10 MEQ tablet Take 1 tablet (10 mEq total) by mouth daily. 90 tablet 3  . potassium chloride (K-DUR,KLOR-CON) 10 MEQ tablet TAKE ONE TABLET BY  MOUTH ONCE DAILY 90 tablet 3  . traMADol (ULTRAM) 50 MG tablet TAKE ONE TABLET BY MOUTH EVERY 6 HOURS AS NEEDED FOR PAIN 120 tablet 2  . triamcinolone (NASACORT AQ) 55 MCG/ACT AERO nasal inhaler Place 2 sprays into the nose daily. 1 Inhaler 12   No current facility-administered medications on file prior to visit.    Review of Systems Constitutional: Negative for increased diaphoresis, or other activity, appetite or siginficant weight change other than noted HENT: Negative for worsening hearing loss, ear pain, facial swelling, mouth sores and neck stiffness.   Eyes: Negative for other worsening pain, redness or visual disturbance.    Respiratory: Negative for choking or stridor Cardiovascular: Negative for other chest pain and palpitations.  Gastrointestinal: Negative for worsening diarrhea, blood in stool, or abdominal distention Genitourinary: Negative for hematuria, flank pain or change in urine volume.  Musculoskeletal: Negative for myalgias or other joint complaints.  Skin: Negative for other color change and wound or drainage.  Neurological: Negative for syncope and numbness. other than noted Hematological: Negative for adenopathy. or other swelling Psychiatric/Behavioral: Negative for hallucinations, SI, self-injury, decreased concentration or other worsening agitation.  All other system neg per pt    Objective:   Physical Exam BP 136/72   Pulse 73   Temp 97.3 F (36.3 C)   Ht 5\' 6"  (1.676 m)   Wt 264 lb (119.7 kg)   SpO2 99%   BMI 42.61 kg/m  VS noted, fatigued appearing Constitutional: Pt is oriented to person, place, and time. Appears well-developed and well-nourished, in no significant distress Head: Normocephalic and atraumatic  Eyes: Conjunctivae and EOM are normal. Pupils are equal, round, and reactive to light Right Ear: External ear normal.  Left Ear: External ear normal Nose: Nose normal.  Mouth/Throat: Oropharynx is clear and moist  Neck: Normal range of motion. Neck supple. No JVD present. No tracheal deviation present or significant neck LA or mass Cardiovascular: Normal rate, regular rhythm, normal heart sounds and intact distal pulses.   Pulmonary/Chest: Effort normal and breath sounds without rales or wheezing  Abdominal: Soft. Bowel sounds are normal. NT. No HSM  Musculoskeletal: Normal range of motion except left knee with marked deg change with reduced ROM, NT, no effusion,  Exhibits no edema Lymphadenopathy: Has no cervical adenopathy.  Neurological: Pt is alert and oriented to person, place, and time. Pt has normal reflexes. No cranial nerve deficit. Motor grossly intact Skin:  Skin is warm and dry. No rash noted or new ulcers Psychiatric:  Has normal mood and affect. Behavior is normal.  No other exam findings    Assessment & Plan:

## 2016-06-01 NOTE — Patient Instructions (Addendum)

## 2016-06-01 NOTE — Assessment & Plan Note (Signed)
stable overall by history and exam, recent data reviewed with pt, and pt to continue medical treatment as before,  to f/u any worsening symptoms or concerns BP Readings from Last 3 Encounters:  06/01/16 136/72  11/30/15 140/78  07/29/15 140/82

## 2016-06-02 LAB — HEPATITIS C ANTIBODY: HCV Ab: NEGATIVE

## 2016-06-23 ENCOUNTER — Other Ambulatory Visit: Payer: Self-pay | Admitting: Internal Medicine

## 2016-06-27 NOTE — Telephone Encounter (Signed)
Faxed

## 2016-06-27 NOTE — Telephone Encounter (Signed)
Done hardcopy to Shirron  

## 2016-06-28 ENCOUNTER — Telehealth: Payer: Self-pay | Admitting: Internal Medicine

## 2016-06-28 NOTE — Telephone Encounter (Addendum)
Pt called with a question about his tramadol prescription. It was sent over to the pharmacy yesterday with a note that it can not be filled until 07/14/16. The pt said that the last time it was filled was on 04/21/16 for a 30 day supply.Please advise.

## 2016-06-28 NOTE — Telephone Encounter (Signed)
I spoke with Aaron Moss and they said that they could see where it was sent with 2 refills but it for some reason it was not transferred over to the prescription. The pt has not used either of the refills that was added back in February. She is going to fill one of the refills for him.

## 2016-06-28 NOTE — Telephone Encounter (Signed)
Dr John please advise.  

## 2016-06-28 NOTE — Telephone Encounter (Signed)
There were also 2 refills, so pt would be not be due to very close to May 1 actually, so I just put apr 27

## 2016-06-28 NOTE — Telephone Encounter (Signed)
Ok great! He must have not known he has refills on the original script that was sent in.

## 2016-08-10 ENCOUNTER — Other Ambulatory Visit: Payer: Self-pay | Admitting: Internal Medicine

## 2016-11-18 ENCOUNTER — Other Ambulatory Visit: Payer: Self-pay | Admitting: Internal Medicine

## 2016-12-05 ENCOUNTER — Other Ambulatory Visit: Payer: Self-pay | Admitting: Internal Medicine

## 2016-12-05 ENCOUNTER — Other Ambulatory Visit (INDEPENDENT_AMBULATORY_CARE_PROVIDER_SITE_OTHER): Payer: Medicare Other

## 2016-12-05 ENCOUNTER — Encounter: Payer: Self-pay | Admitting: Internal Medicine

## 2016-12-05 ENCOUNTER — Telehealth: Payer: Self-pay

## 2016-12-05 ENCOUNTER — Ambulatory Visit (INDEPENDENT_AMBULATORY_CARE_PROVIDER_SITE_OTHER): Payer: Medicare Other | Admitting: Internal Medicine

## 2016-12-05 VITALS — BP 148/78 | HR 72 | Temp 98.1°F | Ht 66.0 in | Wt 269.0 lb

## 2016-12-05 DIAGNOSIS — M25562 Pain in left knee: Secondary | ICD-10-CM | POA: Diagnosis not present

## 2016-12-05 DIAGNOSIS — E785 Hyperlipidemia, unspecified: Secondary | ICD-10-CM

## 2016-12-05 DIAGNOSIS — G8929 Other chronic pain: Secondary | ICD-10-CM

## 2016-12-05 DIAGNOSIS — I10 Essential (primary) hypertension: Secondary | ICD-10-CM | POA: Diagnosis not present

## 2016-12-05 DIAGNOSIS — E876 Hypokalemia: Secondary | ICD-10-CM | POA: Diagnosis not present

## 2016-12-05 DIAGNOSIS — Z23 Encounter for immunization: Secondary | ICD-10-CM

## 2016-12-05 DIAGNOSIS — Z0000 Encounter for general adult medical examination without abnormal findings: Secondary | ICD-10-CM

## 2016-12-05 LAB — BASIC METABOLIC PANEL
BUN: 19 mg/dL (ref 6–23)
CALCIUM: 9.5 mg/dL (ref 8.4–10.5)
CO2: 31 meq/L (ref 19–32)
CREATININE: 1.08 mg/dL (ref 0.40–1.50)
Chloride: 105 mEq/L (ref 96–112)
GFR: 86.2 mL/min (ref >60.00)
Glucose, Bld: 90 mg/dL (ref 70–99)
Potassium: 3.4 mEq/L — ABNORMAL LOW (ref 3.5–5.1)
Sodium: 145 mEq/L (ref 135–145)

## 2016-12-05 MED ORDER — POTASSIUM CHLORIDE ER 10 MEQ PO TBCR: 30.0000 meq | EXTENDED_RELEASE_TABLET | Freq: Every day | ORAL | 3 refills | Status: DC

## 2016-12-05 NOTE — Patient Instructions (Addendum)

## 2016-12-05 NOTE — Telephone Encounter (Signed)
Pt has been informed and expressed understanding.  

## 2016-12-05 NOTE — Telephone Encounter (Signed)
-----   Message from Biagio Borg, MD sent at 12/05/2016 12:58 PM EDT ----- Left message on MyChart, pt to cont same tx except  The test results show that your current treatment is OK, except although improved, the potassium is still mildly low.  We should increase your potassium pills to 3 per day,  I will send a prescription, and you should hear from the office as well.  Hadia Minier to please inform pt, I will do rx

## 2016-12-05 NOTE — Progress Notes (Signed)
Subjective:    Patient ID: Aaron Moss, male    DOB: 09-02-1945, 71 y.o.   MRN: [id number]  HPI  Here to f/u; overall doing ok,  Pt denies chest pain, increasing sob or doe, wheezing, orthopnea, PND, increased LE swelling, palpitations, dizziness or syncope.  Pt denies new neurological symptoms such as new headache, or facial or extremity weakness or numbness.  Pt denies polydipsia, polyuria, or low sugar episode.  Pt states overall good compliance with meds, mostly trying to follow appropriate diet, with wt overall stable,  but little exercise however. BP seems higher with more left knee pain, improved with less pain after cortisone, checks at home.  Pain getting too bad to accept, may seek to have this replaced per Dr Mayer Camel after the first of the year.  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   BP Readings from Last 3 Encounters:  12/05/16 (!) 148/78  06/01/16 136/72  11/30/15 140/78   Wt Readings from Last 3 Encounters:  12/05/16 269 lb (122 kg)  06/01/16 264 lb (119.7 kg)  11/30/15 269 lb (122 kg)   Past Medical History:  Diagnosis Date  . Colon cancer ([place]) 2000  . Diverticulosis of colon   . History of chemotherapy 2000   colon cancer  . History of colon cancer   . History of radiation therapy 08/06/13- 10/03/13   prostate 7800 cGy 40 sessions, seminal vesicles 5600 cGy 40 sessions  . Hyperlipidemia   . Hypertension   . Left knee DJD   . 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 COLON SURGERY  2000   colon cancer  . COLONOSCOPY    . EUS     pancreatic cyst  . KNEE ARTHROSCOPY  2007   [facility]- [facility]  . PROSTATE BIOPSY  05/23/13   gleason 7, volume 11.5 ml    reports that he quit smoking about 28 years ago. His smoking use included Cigarettes. He has a 22.00 pack-year smoking history. He has never used smokeless tobacco. He reports that he drinks alcohol. He  reports that he does not use drugs. family history includes Cancer in his brother, maternal grandmother, and mother; Colon cancer in his sister; Diabetes in his brother. Allergies  Allergen Reactions  . Pregabalin     REACTION: felt bad   Current Outpatient Prescriptions on File Prior to Visit  Medication Sig Dispense Refill  . amLODipine (NORVASC) 10 MG tablet TAKE ONE TABLET BY MOUTH DAILY. 90 tablet 3  . aspirin EC 81 MG tablet Take 81 mg by mouth daily.      Marland Kitchen atorvastatin (LIPITOR) 40 MG tablet TAKE ONE TABLET BY MOUTH DAILY. 90 tablet 3  . benazepril (LOTENSIN) 40 MG tablet TAKE ONE TABLET BY MOUTH ONCE DAILY 90 tablet 1  . diclofenac (VOLTAREN) 75 MG EC tablet TAKE ONE TABLET BY MOUTH TWICE DAILY AS NEEDED FOR PAIN 60 tablet 5  . hydrochlorothiazide (HYDRODIURIL) 25 MG tablet TAKE ONE TABLET BY MOUTH ONCE DAILY 40 tablet 9  . traMADol (ULTRAM) 50 MG tablet TAKE ONE TABLET BY MOUTH EVERY 6 HOURS AS NEEDED FOR PAIN 120 tablet 2   No current facility-administered medications on file prior to visit.    Review of Systems  Constitutional: Negative for other unusual diaphoresis or sweats HENT: Negative for ear discharge or swelling Eyes: Negative for other worsening visual  disturbances Respiratory: Negative for stridor or other swelling  Gastrointestinal: Negative for worsening distension or other blood Genitourinary: Negative for retention or other urinary change Musculoskeletal: Negative for other MSK pain or swelling Skin: Negative for color change or other new lesions Neurological: Negative for worsening tremors and other numbness  Psychiatric/Behavioral: Negative for worsening agitation or other fatigue All other system neg per pt    Objective:   Physical Exam BP (!) 148/78   Pulse 72   Temp 98.1 F (36.7 C) (Oral)   Ht 5\' 6"  (1.676 m)   Wt 269 lb (122 kg)   SpO2 99%   BMI 43.42 kg/m  VS noted,  Constitutional: Pt appears in NAD HENT: Head: NCAT.  Right Ear:  External ear normal.  Left Ear: External ear normal.  Eyes: . Pupils are equal, round, and reactive to light. Conjunctivae and EOM are normal Nose: without d/c or deformity Neck: Neck supple. Gross normal ROM Cardiovascular: Normal rate and regular rhythm.   Pulmonary/Chest: Effort normal and breath sounds without rales or wheezing.  Abd:  Soft, NT, ND, + BS, no organomegaly Neurological: Pt is alert. At baseline orientation, motor grossly intact Skin: Skin is warm. No rashes, other new lesions, no LE edema Psychiatric: Pt behavior is normal without agitation  No other exam findings Lab Results  Component Value Date   WBC 7.4 06/01/2016   HGB 14.5 06/01/2016   HCT 44.4 06/01/2016   PLT 227.0 06/01/2016   GLUCOSE 90 12/05/2016   CHOL 166 06/01/2016   TRIG 124.0 06/01/2016   HDL 32.80 (L) 06/01/2016   LDLDIRECT 185.6 05/15/2007   LDLCALC 109 (H) 06/01/2016   ALT 12 06/01/2016   AST 14 06/01/2016   NA 145 12/05/2016   K 3.4 (L) 12/05/2016   CL 105 12/05/2016   CREATININE 1.08 12/05/2016   BUN 19 12/05/2016   CO2 31 12/05/2016   TSH 1.29 06/01/2016   PSA 0.16 06/01/2016      Assessment & Plan:

## 2016-12-08 NOTE — Assessment & Plan Note (Signed)
Mild elevated today, likely situational, o/w stable overall by history and exam, recent data reviewed with pt, and pt to continue medical treatment as before,  to f/u any worsening symptoms or concerns BP Readings from Last 3 Encounters:  12/05/16 (!) 148/78  06/01/16 136/72  11/30/15 140/78

## 2016-12-08 NOTE — Assessment & Plan Note (Signed)
Lab Results  Component Value Date   LDLCALC 109 (H) 06/01/2016  stable overall by history and exam, recent data reviewed with pt, and pt to continue medical treatment as before,  to f/u any worsening symptoms or concerns

## 2016-12-08 NOTE — Assessment & Plan Note (Signed)
Moderate to severe persistent, for pain control, f/u ortho as planned

## 2016-12-08 NOTE — Assessment & Plan Note (Signed)
Mild, for f/u lab today

## 2016-12-17 ENCOUNTER — Other Ambulatory Visit: Payer: Self-pay | Admitting: Internal Medicine

## 2017-02-05 ENCOUNTER — Other Ambulatory Visit: Payer: Self-pay | Admitting: Internal Medicine

## 2017-02-11 ENCOUNTER — Other Ambulatory Visit: Payer: Self-pay | Admitting: Internal Medicine

## 2017-02-12 NOTE — Telephone Encounter (Signed)
Done erx 

## 2017-03-06 ENCOUNTER — Other Ambulatory Visit: Payer: Self-pay | Admitting: Internal Medicine

## 2017-05-07 ENCOUNTER — Other Ambulatory Visit: Payer: Self-pay | Admitting: Internal Medicine

## 2017-05-15 ENCOUNTER — Other Ambulatory Visit: Payer: Self-pay | Admitting: Internal Medicine

## 2017-06-05 ENCOUNTER — Ambulatory Visit: Payer: Medicare Other | Admitting: Internal Medicine

## 2017-06-11 ENCOUNTER — Ambulatory Visit: Payer: Medicare Other | Admitting: Internal Medicine

## 2017-06-11 ENCOUNTER — Encounter: Payer: Self-pay | Admitting: Internal Medicine

## 2017-06-11 ENCOUNTER — Other Ambulatory Visit (INDEPENDENT_AMBULATORY_CARE_PROVIDER_SITE_OTHER): Payer: Medicare Other

## 2017-06-11 VITALS — BP 142/88 | HR 87 | Temp 98.3°F | Ht 66.0 in | Wt 270.0 lb

## 2017-06-11 DIAGNOSIS — Z0000 Encounter for general adult medical examination without abnormal findings: Secondary | ICD-10-CM | POA: Diagnosis not present

## 2017-06-11 DIAGNOSIS — I10 Essential (primary) hypertension: Secondary | ICD-10-CM | POA: Diagnosis not present

## 2017-06-11 LAB — BASIC METABOLIC PANEL
BUN: 15 mg/dL (ref 6–23)
CO2: 32 meq/L (ref 19–32)
CREATININE: 1.09 mg/dL (ref 0.40–1.50)
Calcium: 9.7 mg/dL (ref 8.4–10.5)
Chloride: 104 mEq/L (ref 96–112)
GFR: 85.16 mL/min (ref >60.00)
Glucose, Bld: 95 mg/dL (ref 70–99)
POTASSIUM: 3.8 meq/L (ref 3.5–5.1)
Sodium: 143 mEq/L (ref 135–145)

## 2017-06-11 LAB — CBC WITH DIFFERENTIAL/PLATELET
BASOS ABS: 0.1 10*3/uL (ref 0.0–0.1)
BASOS PCT: 1 % (ref 0.0–3.0)
EOS ABS: 0.1 10*3/uL (ref 0.0–0.7)
Eosinophils Relative: 1.1 % (ref 0.0–5.0)
HCT: 47 % (ref 39.0–52.0)
Hemoglobin: 15.5 g/dL (ref 13.0–17.0)
LYMPHS ABS: 1.8 10*3/uL (ref 0.7–4.0)
Lymphocytes Relative: 18.7 % (ref 12.0–46.0)
MCHC: 32.9 g/dL (ref 30.0–36.0)
MCV: 81.6 fl (ref 78.0–100.0)
Monocytes Absolute: 0.6 10*3/uL (ref 0.1–1.0)
Monocytes Relative: 6.1 % (ref 3.0–12.0)
NEUTROS ABS: 7.2 10*3/uL (ref 1.4–7.7)
NEUTROS PCT: 73.1 % (ref 43.0–77.0)
Platelets: 230 10*3/uL (ref 150.0–400.0)
RBC: 5.76 Mil/uL (ref 4.22–5.81)
RDW: 17.4 % — AB (ref 11.5–15.5)
WBC: 9.9 10*3/uL (ref 4.0–10.5)

## 2017-06-11 LAB — HEPATIC FUNCTION PANEL
ALBUMIN: 3.9 g/dL (ref 3.5–5.2)
ALK PHOS: 112 U/L (ref 39–117)
ALT: 16 U/L (ref 0–53)
AST: 13 U/L (ref 0–37)
BILIRUBIN DIRECT: 0.3 mg/dL (ref 0.0–0.3)
Total Bilirubin: 1.6 mg/dL — ABNORMAL HIGH (ref 0.2–1.2)
Total Protein: 7.9 g/dL (ref 6.0–8.3)

## 2017-06-11 LAB — LIPID PANEL
CHOL/HDL RATIO: 3
CHOLESTEROL: 145 mg/dL (ref 0–200)
HDL: 42.6 mg/dL (ref >39.00)
LDL CALC: 82 mg/dL (ref 0–99)
NonHDL: 102.55
TRIGLYCERIDES: 102 mg/dL (ref 0.0–149.0)
VLDL: 20.4 mg/dL (ref 0.0–40.0)

## 2017-06-11 LAB — TSH: TSH: 1.47 u[IU]/mL (ref 0.35–4.50)

## 2017-06-11 NOTE — Progress Notes (Signed)
Subjective:    Patient ID: Aaron Moss, male    DOB: [birth year], 72 y.o.   MRN: [id number]  HPI  Here for wellness and f/u;  Overall doing ok;  Pt denies Chest pain, worsening SOB, DOE, wheezing, orthopnea, PND, worsening LE edema, palpitations, dizziness or syncope.  Pt denies neurological change such as new headache, facial or extremity weakness.  Pt denies polydipsia, polyuria, or low sugar symptoms. Pt states overall good compliance with treatment and medications, good tolerability, and has been trying to follow appropriate diet.  Pt denies worsening depressive symptoms, suicidal ideation or panic. No fever, night sweats, wt loss, loss of appetite, or other constitutional symptoms.  Pt states good ability with ADL's, has low fall risk, home safety reviewed and adequate, no other significant changes in hearing or vision, and not active with exercise.  See urology for prostate ca - last seen about 4 mo ago with psa and ua per pt  No other new complaints Past Medical History:  Diagnosis Date  . Colon cancer ([facility]) 2000  . Diverticulosis of colon   . History of chemotherapy 2000   colon cancer  . History of colon cancer   . History of radiation therapy 08/06/13- 10/03/13   prostate 7800 cGy 40 sessions, seminal vesicles 5600 cGy 40 sessions  . Hyperlipidemia   . Hypertension   . Left knee DJD   . Prostate cancer (Aaron Moss) 05/23/13   gleason 3+4=7, volume 11.5 ml   Past Surgical History:  Procedure Laterality Date  . COLON SURGERY  2000   colon cancer  . COLONOSCOPY    . EUS     pancreatic cyst  . KNEE ARTHROSCOPY  2007   [facility]- [facility]  . PROSTATE BIOPSY  05/23/13   gleason 7, volume 11.5 ml    reports that he quit smoking about 29 years ago. His smoking use included cigarettes. He has a 22.00 pack-year smoking history. He has never used smokeless tobacco. He reports that he drinks alcohol. He reports that he does not use drugs. family history includes Cancer in his brother, maternal  grandmother, and mother; Colon cancer in his sister; Diabetes in his brother. Allergies  Allergen Reactions  . Pregabalin     REACTION: felt bad   Current Outpatient Medications on File Prior to Visit  Medication Sig Dispense Refill  . amLODipine (NORVASC) 10 MG tablet TAKE ONE TABLET BY MOUTH ONCE DAILY 90 tablet 1  . aspirin EC 81 MG tablet Take 81 mg by mouth daily.      Marland Kitchen atorvastatin (LIPITOR) 40 MG tablet TAKE 1 TABLET BY MOUTH ONCE DAILY 90 tablet 0  . benazepril (LOTENSIN) 40 MG tablet TAKE 1 TABLET BY MOUTH ONCE DAILY 90 tablet 0  . diclofenac (VOLTAREN) 75 MG EC tablet TAKE ONE TABLET BY MOUTH TWICE DAILY AS NEEDED FOR PAIN 60 tablet 5  . hydrochlorothiazide (HYDRODIURIL) 25 MG tablet TAKE ONE TABLET BY MOUTH ONCE DAILY 90 tablet 3  . potassium chloride (K-DUR) 10 MEQ tablet Take 3 tablets (30 mEq total) by mouth daily. 270 tablet 3  . traMADol (ULTRAM) 50 MG tablet TAKE 1 TABLET BY MOUTH EVERY 6 HOURS AS NEEDED FOR PAIN 120 tablet 2   No current facility-administered medications on file prior to visit.    Review of Systems  Constitutional: Negative for other unusual diaphoresis, sweats, appetite or weight changes HENT: Negative for other worsening hearing loss, ear pain, facial swelling, mouth sores or neck stiffness.   Eyes:  Negative for other worsening pain, redness or other visual disturbance.  Respiratory: Negative for other stridor or swelling Cardiovascular: Negative for other palpitations or other chest pain  Gastrointestinal: Negative for worsening diarrhea or loose stools, blood in stool, distention or other pain Genitourinary: Negative for hematuria, flank pain or other change in urine volume.  Musculoskeletal: Negative for myalgias or other joint swelling.  Skin: Negative for other color change, or other wound or worsening drainage.  Neurological: Negative for other syncope or numbness. Hematological: Negative for other adenopathy or  swelling Psychiatric/Behavioral: Negative for hallucinations, other worsening agitation, SI, self-injury, or new decreased concentration All other system neg per pt    Objective:   Physical Exam BP (!) 142/88   Pulse 87   Temp 98.3 F (36.8 C) (Oral)   Ht 5\' 6"  (1.676 m)   Wt 270 lb (122.5 kg)   SpO2 96%   BMI 43.58 kg/m  VS noted, morbd obese Constitutional: Pt is oriented to person, place, and time. Appears well-developed and well-nourished, in no significant distress and comfortable Head: Normocephalic and atraumatic  Eyes: Conjunctivae and EOM are normal. Pupils are equal, round, and reactive to light Right Ear: External ear normal without discharge Left Ear: External ear normal without discharge Nose: Nose without discharge or deformity Mouth/Throat: Oropharynx is without other ulcerations and moist  Neck: Normal range of motion. Neck supple. No JVD present. No tracheal deviation present or significant neck LA or mass Cardiovascular: Normal rate, regular rhythm, normal heart sounds and intact distal pulses.   Pulmonary/Chest: WOB normal and breath sounds without rales or wheezing  Abdominal: Soft. Bowel sounds are normal. NT. No HSM  Musculoskeletal: Normal range of motion. Exhibits no edema Lymphadenopathy: Has no other cervical adenopathy.  Neurological: Pt is alert and oriented to person, place, and time. Pt has normal reflexes. No cranial nerve deficit. Motor grossly intact, Gait intact Skin: Skin is warm and dry. No rash noted or new ulcerations Psychiatric:  Has normal mood and affect. Behavior is normal without agitation No other exam findings    Assessment & Plan:

## 2017-06-11 NOTE — Assessment & Plan Note (Signed)

## 2017-06-11 NOTE — Assessment & Plan Note (Signed)
stable overall by history and exam, recent data reviewed with pt, and pt to continue medical treatment as before,  to f/u any worsening symptoms or concerns  

## 2017-06-11 NOTE — Patient Instructions (Addendum)

## 2017-06-16 ENCOUNTER — Other Ambulatory Visit: Payer: Self-pay | Admitting: Internal Medicine

## 2017-07-20 ENCOUNTER — Other Ambulatory Visit: Payer: Self-pay | Admitting: Internal Medicine

## 2017-07-20 NOTE — Telephone Encounter (Signed)
05/30/2017 120# 

## 2017-07-20 NOTE — Telephone Encounter (Signed)
Done erx 

## 2017-08-04 ENCOUNTER — Other Ambulatory Visit: Payer: Self-pay | Admitting: Internal Medicine

## 2017-08-07 ENCOUNTER — Other Ambulatory Visit: Payer: Self-pay | Admitting: Internal Medicine

## 2017-08-10 ENCOUNTER — Other Ambulatory Visit: Payer: Self-pay | Admitting: Internal Medicine

## 2017-11-01 ENCOUNTER — Encounter: Payer: Self-pay | Admitting: Internal Medicine

## 2017-11-01 ENCOUNTER — Ambulatory Visit: Payer: Medicare Other | Admitting: Internal Medicine

## 2017-11-01 VITALS — BP 144/82 | HR 81 | Temp 98.5°F | Ht 66.0 in | Wt 267.0 lb

## 2017-11-01 DIAGNOSIS — E785 Hyperlipidemia, unspecified: Secondary | ICD-10-CM | POA: Diagnosis not present

## 2017-11-01 DIAGNOSIS — Z0000 Encounter for general adult medical examination without abnormal findings: Secondary | ICD-10-CM | POA: Diagnosis not present

## 2017-11-01 DIAGNOSIS — Z86010 Personal history of colonic polyps: Secondary | ICD-10-CM

## 2017-11-01 DIAGNOSIS — S93402A Sprain of unspecified ligament of left ankle, initial encounter: Secondary | ICD-10-CM

## 2017-11-01 DIAGNOSIS — I10 Essential (primary) hypertension: Secondary | ICD-10-CM

## 2017-11-01 NOTE — Progress Notes (Signed)
Subjective:    Patient ID: Aaron Moss, male    DOB: [birth year], 72 y.o.   MRN: [id number]  HPI  Here to f/u; overall doing ok,  Pt denies chest pain, increasing sob or doe, wheezing, orthopnea, PND, increased LE swelling, palpitations, dizziness or syncope.  Pt denies new neurological symptoms such as new headache, or facial or extremity weakness or numbness.  Pt denies polydipsia, polyuria, or low sugar episode.  Pt states overall good compliance with meds, mostly trying to follow appropriate diet, with wt overall stable,  but little exercise however.  Fell 2 wks ago with left ankle injury he though was getting better but still ongoing after 2 wks, just less seveere, worse to walk and better later in the day; had some swelling to start, now improved.  Ankle brace might have helped, not sure  BP at home < 140/90.   Past Medical History:  Diagnosis Date  . Colon cancer ([place]) 2000  . Diverticulosis of colon   . History of chemotherapy 2000   colon cancer  . History of colon cancer   . History of radiation therapy 08/06/13- 10/03/13   prostate 7800 cGy 40 sessions, seminal vesicles 5600 cGy 40 sessions  . Hyperlipidemia   . Hypertension   . Left knee DJD   . 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 COLON SURGERY  2000   colon cancer  . COLONOSCOPY    . EUS     pancreatic cyst  . KNEE ARTHROSCOPY  2007   LT- [facility]  . PROSTATE BIOPSY  05/23/13   gleason 7, volume 11.5 ml    reports that he quit smoking about 29 years ago. His smoking use included cigarettes. He has a 22.00 pack-year smoking history. He has never used smokeless tobacco. He reports that he drinks alcohol. He reports that he does not use drugs. family history includes Cancer in his brother, maternal grandmother, and mother; Colon cancer in his sister; Diabetes in his brother. Allergies  Allergen Reactions  . Pregabalin     REACTION: felt  bad   Current Outpatient Medications on File Prior to Visit  Medication Sig Dispense Refill  . amLODipine (NORVASC) 10 MG tablet TAKE 1 TABLET BY MOUTH ONCE DAILY 90 tablet 1  . aspirin EC 81 MG tablet Take 81 mg by mouth daily.      Marland Kitchen atorvastatin (LIPITOR) 40 MG tablet TAKE 1 TABLET BY MOUTH ONCE DAILY 90 tablet 3  . benazepril (LOTENSIN) 40 MG tablet TAKE 1 TABLET BY MOUTH ONCE DAILY 90 tablet 0  . diclofenac (VOLTAREN) 75 MG EC tablet TAKE 1 TABLET BY MOUTH TWICE DAILY AS NEEDED FOR PAIN 60 tablet 5  . hydrochlorothiazide (HYDRODIURIL) 25 MG tablet TAKE ONE TABLET BY MOUTH ONCE DAILY 90 tablet 3  . potassium chloride (K-DUR) 10 MEQ tablet Take 3 tablets (30 mEq total) by mouth daily. 270 tablet 3  . traMADol (ULTRAM) 50 MG tablet TAKE 1 TABLET BY MOUTH EVERY 6 HOURS AS NEEDED FOR PAIN 120 tablet 2   No current facility-administered medications on file prior to visit.    Review of Systems  Constitutional: Negative for other unusual diaphoresis or sweats HENT: Negative for ear discharge or swelling Eyes: Negative for other worsening visual disturbances Respiratory: Negative for stridor or other swelling  Gastrointestinal: Negative for worsening distension or other blood Genitourinary: Negative for retention or other urinary  change Musculoskeletal: Negative for other MSK pain or swelling Skin: Negative for color change or other new lesions Neurological: Negative for worsening tremors and other numbness  Psychiatric/Behavioral: Negative for worsening agitation or other fatigue All other system neg per pt    Objective:   Physical Exam BP (!) 144/82   Pulse 81   Temp 98.5 F (36.9 C) (Oral)   Ht 5\' 6"  (1.676 m)   Wt 267 lb (121.1 kg)   SpO2 94%   BMI 43.09 kg/m  VS noted, not ill appearing Constitutional: Pt appears in NAD HENT: Head: NCAT.  Right Ear: External ear normal.  Left Ear: External ear normal.  Eyes: . Pupils are equal, round, and reactive to light. Conjunctivae  and EOM are normal Nose: without d/c or deformity Neck: Neck supple. Gross normal ROM Cardiovascular: Normal rate and regular rhythm.   Pulmonary/Chest: Effort normal and breath sounds without rales or wheezing.  Abd:  Soft, NT, ND, + BS, no organomegaly Left ankle with mild tender about the medial malleolus, and trace foot swelling compared to none on the right foot, o/w neurovasc intact Neurological: Pt is alert. At baseline orientation, motor grossly intact Skin: Skin is warm. No rashes, other new lesions, no LE edema Psychiatric: Pt behavior is normal without agitation  No other exam findings  Lab Results  Component Value Date   WBC 9.9 06/11/2017   HGB 15.5 06/11/2017   HCT 47.0 06/11/2017   PLT 230.0 06/11/2017   GLUCOSE 95 06/11/2017   CHOL 145 06/11/2017   TRIG 102.0 06/11/2017   HDL 42.60 06/11/2017   LDLDIRECT 185.6 05/15/2007   LDLCALC 82 06/11/2017   ALT 16 06/11/2017   AST 13 06/11/2017   NA 143 06/11/2017   K 3.8 06/11/2017   CL 104 06/11/2017   CREATININE 1.09 06/11/2017   BUN 15 06/11/2017   CO2 32 06/11/2017   TSH 1.47 06/11/2017   PSA 0.16 06/01/2016       Assessment & Plan:

## 2017-11-01 NOTE — Assessment & Plan Note (Signed)
stable overall by history and exam, recent data reviewed with pt, and pt to continue medical treatment as before,  to f/u any worsening symptoms or concerns  

## 2017-11-01 NOTE — Patient Instructions (Addendum)
Ok for tylenol as needed for the left ankle sprain, which should continue to improved in the next few weeks  Please continue all other medications as before, and refills have been done if requested.  Please have the pharmacy call with any other refills you may need.  Please continue your efforts at being more active, low cholesterol diet, and weight control.  Please keep your appointments with your specialists as you may have planned  Please return in 6 months, or sooner if needed, with Lab testing done 3-5 days before (ok to cancel the sept appt)

## 2017-11-01 NOTE — Assessment & Plan Note (Signed)
Mild, improving, will hold on imaging, cont tylenol prn and rest with further improvement expected over the next 2 wks, ok to cont the ankle brace prn

## 2017-11-01 NOTE — Assessment & Plan Note (Signed)
stable overall by history and exam, recent data reviewed with pt, and pt to continue medical treatment as before,  to f/u any worsening symptoms or concerns Lab Results  Component Value Date   LDLCALC 82 06/11/2017

## 2017-11-06 ENCOUNTER — Encounter: Payer: Self-pay | Admitting: Internal Medicine

## 2017-11-09 ENCOUNTER — Other Ambulatory Visit: Payer: Self-pay | Admitting: Internal Medicine

## 2017-11-22 ENCOUNTER — Other Ambulatory Visit: Payer: Self-pay | Admitting: Internal Medicine

## 2017-12-11 ENCOUNTER — Other Ambulatory Visit: Payer: Self-pay | Admitting: Internal Medicine

## 2017-12-14 ENCOUNTER — Ambulatory Visit: Payer: Medicare Other | Admitting: Internal Medicine

## 2017-12-27 ENCOUNTER — Encounter: Payer: Self-pay | Admitting: Internal Medicine

## 2017-12-27 ENCOUNTER — Ambulatory Visit (AMBULATORY_SURGERY_CENTER): Payer: Self-pay

## 2017-12-27 VITALS — Ht 66.0 in | Wt 272.0 lb

## 2017-12-27 DIAGNOSIS — Z85038 Personal history of other malignant neoplasm of large intestine: Secondary | ICD-10-CM

## 2017-12-27 NOTE — Progress Notes (Signed)
Denies allergies to eggs or soy products. Denies complication of anesthesia or sedation. Denies use of weight loss medication. Denies use of O2.   Emmi instructions declined.  

## 2017-12-28 ENCOUNTER — Telehealth: Payer: Self-pay | Admitting: Internal Medicine

## 2017-12-28 NOTE — Telephone Encounter (Signed)
Returned patients call regarding Travatan eye drops. Patient was told to write the name of the medication and how it is taken. Medication will be added when he comes to his procedure appointment. Patient was informed that taking the eye drops will not change his procedure instructions. Patient verbalized understanding.   Riki Sheer, LPN

## 2017-12-28 NOTE — Telephone Encounter (Signed)
Pt states that he forgot to tell nurse about one more med yesterday. It's an eyedrop Travatan to add to medlist.

## 2018-01-07 ENCOUNTER — Other Ambulatory Visit: Payer: Self-pay | Admitting: Internal Medicine

## 2018-01-07 NOTE — Telephone Encounter (Signed)
   LOV:11/01/17 NextOV:05/03/18  Last Filled/Quantity:11/09/17 120#

## 2018-01-10 ENCOUNTER — Encounter: Payer: Self-pay | Admitting: Internal Medicine

## 2018-01-10 ENCOUNTER — Ambulatory Visit (AMBULATORY_SURGERY_CENTER): Payer: Medicare Other | Admitting: Internal Medicine

## 2018-01-10 VITALS — BP 152/67 | HR 55 | Temp 99.8°F | Resp 16 | Ht 66.0 in | Wt 267.0 lb

## 2018-01-10 DIAGNOSIS — D125 Benign neoplasm of sigmoid colon: Secondary | ICD-10-CM

## 2018-01-10 DIAGNOSIS — Z85038 Personal history of other malignant neoplasm of large intestine: Secondary | ICD-10-CM | POA: Diagnosis present

## 2018-01-10 DIAGNOSIS — D120 Benign neoplasm of cecum: Secondary | ICD-10-CM

## 2018-01-10 DIAGNOSIS — K635 Polyp of colon: Secondary | ICD-10-CM | POA: Diagnosis not present

## 2018-01-10 MED ORDER — SODIUM CHLORIDE 0.9 % IV SOLN: 500.0000 mL | Freq: Once | INTRAVENOUS | Status: DC

## 2018-01-10 NOTE — Op Note (Signed)
[facility] Patient Name: Aaron Moss Procedure Date: 01/10/2018 9:52 AM MRN: [id number] Endoscopist: [name] , MD Age: 72 Referring MD:  Date of Birth: [birth year] Gender: Male Account #: [id number] Procedure:                Colonoscopy Indications:              High risk colon cancer surveillance: Personal                            history of colon cancer Medicines:                Propofol per Anesthesia, Monitored Anesthesia Care Procedure:                Pre-Anesthesia Assessment:                           - Prior to the procedure, a History and Physical                            was performed, and patient medications and                            allergies were reviewed. The patient's tolerance of                            previous anesthesia was also reviewed. The risks                            and benefits of the procedure and the sedation                            options and risks were discussed with the patient.                            All questions were answered, and informed consent                            was obtained. Prior Anticoagulants: The patient has                            taken no previous anticoagulant or antiplatelet                            agents. ASA Grade Assessment: II - A patient with                            mild systemic disease. After reviewing the risks                            and benefits, the patient was deemed in                            satisfactory condition to undergo the procedure.  After obtaining informed consent, the colonoscope                            was passed under direct vision. Throughout the                            procedure, the patient's blood pressure, pulse, and                            oxygen saturations were monitored continuously. The                            Model CF-HQ190L [id number]) scope was introduced                            through the anus  and advanced to the the cecum,                            identified by appendiceal orifice and ileocecal                            valve. The colonoscopy was performed without                            difficulty. The patient tolerated the procedure                            well. The quality of the bowel preparation was                            good. The bowel preparation used was Miralax. The                            ileocecal valve, appendiceal orifice, and rectum                            were photographed. Scope In: 9:57:17 AM Scope Out: 10:08:44 AM Scope Withdrawal Time: 0 hours 9 minutes 54 seconds  Total Procedure Duration: 0 hours 11 minutes 27 seconds  Findings:                 The perianal and digital rectal examinations were                            normal.                           A diminutive polyp was found in the sigmoid colon.                            The polyp was sessile. The polyp was removed with a                            cold snare. Resection and retrieval were complete.  Verification of patient identification for the                            specimen was done. Estimated blood loss was minimal.                           A 1 to 2 mm polyp was found in the cecum. The polyp                            was sessile. The polyp was removed with a cold                            biopsy forceps. Resection and retrieval were                            complete. Verification of patient identification                            for the specimen was done. Estimated blood loss was                            minimal.                           The exam was otherwise without abnormality on                            direct and retroflexion views. Complications:            No immediate complications. Estimated Blood Loss:     Estimated blood loss was minimal. Impression:               - One diminutive polyp in the sigmoid colon,                             removed with a cold snare. Resected and retrieved.                           - One 1 to 2 mm polyp in the cecum, removed with a                            cold biopsy forceps. Resected and retrieved.                           - The examination was otherwise normal on direct                            and retroflexion views. Recommendation:           - Patient has a contact number available for                            emergencies. The signs and symptoms of potential  delayed complications were discussed with the                            patient. Return to normal activities tomorrow.                            Written discharge instructions were provided to the                            patient.                           - Resume previous diet.                           - Continue present medications.                           - Repeat colonoscopy is recommended for                            surveillance. will be considered - he will be 25 in                            5 years but does have a history of colon cancer.                           The colonoscopy date will be determined after                            pathology results from today's exam become                            available for review. [name], MD 01/10/2018 10:25:22 AM This report has been signed electronically.

## 2018-01-10 NOTE — Patient Instructions (Addendum)
I found and removed 2 tiny polyps.  I am not sure you will need another routine colonoscopy - I bet the next one would be at 30 and at that age having another colonoscopy is unlikely to make you live longer.  We will think about it and discuss when that time rolls around.  I appreciate the opportunity to care for you. [name], MD, [facility]   Handout given ; Polyps  YOU HAD AN ENDOSCOPIC PROCEDURE TODAY AT [place]:   Refer to the procedure report that was given to you for any specific questions about what was found during the examination.  If the procedure report does not answer your questions, please call your gastroenterologist to clarify.  If you requested that your care partner not be given the details of your procedure findings, then the procedure report has been included in a sealed envelope for you to review at your convenience later.  YOU SHOULD EXPECT: Some feelings of bloating in the abdomen. Passage of more gas than usual.  Walking can help get rid of the air that was put into your GI tract during the procedure and reduce the bloating. If you had a lower endoscopy (such as a colonoscopy or flexible sigmoidoscopy) you may notice spotting of blood in your stool or on the toilet paper. If you underwent a bowel prep for your procedure, you may not have a normal bowel movement for a few days.  Please Note:  You might notice some irritation and congestion in your nose or some drainage.  This is from the oxygen used during your procedure.  There is no need for concern and it should clear up in a day or so.  SYMPTOMS TO REPORT IMMEDIATELY:   Following lower endoscopy (colonoscopy or flexible sigmoidoscopy):  Excessive amounts of blood in the stool  Significant tenderness or worsening of abdominal pains  Swelling of the abdomen that is new, acute  Fever of 100F or higher   For urgent or emergent issues, a gastroenterologist can be reached at any hour by  calling [phone].   DIET:  We do recommend a small meal at first, but then you may proceed to your regular diet.  Drink plenty of fluids but you should avoid alcoholic beverages for 24 hours.  ACTIVITY:  You should plan to take it easy for the rest of today and you should NOT DRIVE or use heavy machinery until tomorrow (because of the sedation medicines used during the test).    FOLLOW UP: Our staff will call the number listed on your records the next business day following your procedure to check on you and address any questions or concerns that you may have regarding the information given to you following your procedure. If we do not reach you, we will leave a message.  However, if you are feeling well and you are not experiencing any problems, there is no need to return our call.  We will assume that you have returned to your regular daily activities without incident.  If any biopsies were taken you will be contacted by phone or by letter within the next 1-3 weeks.  Please call us at [phone] if you have not heard about the biopsies in 3 weeks.    SIGNATURES/CONFIDENTIALITY: You and/or your care partner have signed paperwork which will be entered into your electronic medical record.  These signatures attest to the fact that that the information above on your After Visit Summary has  been reviewed and is understood.  Full responsibility of the confidentiality of this discharge information lies with you and/or your care-partner.

## 2018-01-11 ENCOUNTER — Telehealth: Payer: Self-pay | Admitting: *Deleted

## 2018-01-11 NOTE — Telephone Encounter (Signed)
  Follow up Call-  Call back number 01/10/2018  Post procedure Call Back phone  # [phone]  Permission to leave phone message Yes  Some recent data might be hidden     Patient questions:  Do you have a fever, pain , or abdominal swelling? No. Pain Score  0 *  Have you tolerated food without any problems? Yes.    Have you been able to return to your normal activities? yes  Do you have any questions about your discharge instructions: Diet   No. Medications  No. Follow up visit  No.  Do you have questions or concerns about your Care? No.  Actions: * If pain score is 4 or above: No action needed, pain <4.

## 2018-01-22 ENCOUNTER — Encounter: Payer: Self-pay | Admitting: Internal Medicine

## 2018-01-22 NOTE — Progress Notes (Signed)
Polyps not pre-cancerous Recall 2024 My Chart

## 2018-03-02 ENCOUNTER — Other Ambulatory Visit: Payer: Self-pay | Admitting: Family

## 2018-03-02 ENCOUNTER — Other Ambulatory Visit: Payer: Self-pay | Admitting: Internal Medicine

## 2018-03-04 NOTE — Telephone Encounter (Signed)
Done erx 

## 2018-05-01 ENCOUNTER — Telehealth: Payer: Self-pay | Admitting: *Deleted

## 2018-05-01 NOTE — Telephone Encounter (Signed)
Called patient to schedule AWV, patient scheduled on 05/03/18.

## 2018-05-03 ENCOUNTER — Ambulatory Visit (INDEPENDENT_AMBULATORY_CARE_PROVIDER_SITE_OTHER): Payer: Medicare Other | Admitting: *Deleted

## 2018-05-03 ENCOUNTER — Encounter: Payer: Self-pay | Admitting: Internal Medicine

## 2018-05-03 ENCOUNTER — Ambulatory Visit (INDEPENDENT_AMBULATORY_CARE_PROVIDER_SITE_OTHER): Payer: Medicare Other | Admitting: Internal Medicine

## 2018-05-03 ENCOUNTER — Other Ambulatory Visit (INDEPENDENT_AMBULATORY_CARE_PROVIDER_SITE_OTHER): Payer: Medicare Other

## 2018-05-03 VITALS — BP 141/88 | HR 77 | Temp 98.9°F | Ht 66.0 in | Wt 269.0 lb

## 2018-05-03 VITALS — BP 141/88 | HR 77 | Temp 98.9°F | Resp 17 | Ht 66.0 in | Wt 269.0 lb

## 2018-05-03 DIAGNOSIS — R739 Hyperglycemia, unspecified: Secondary | ICD-10-CM

## 2018-05-03 DIAGNOSIS — R059 Cough, unspecified: Secondary | ICD-10-CM | POA: Insufficient documentation

## 2018-05-03 DIAGNOSIS — Z0000 Encounter for general adult medical examination without abnormal findings: Secondary | ICD-10-CM

## 2018-05-03 DIAGNOSIS — R05 Cough: Secondary | ICD-10-CM

## 2018-05-03 LAB — BASIC METABOLIC PANEL
BUN: 13 mg/dL (ref 6–23)
CO2: 29 mEq/L (ref 19–32)
Calcium: 9.3 mg/dL (ref 8.4–10.5)
Chloride: 106 mEq/L (ref 96–112)
Creatinine, Ser: 1.33 mg/dL (ref 0.40–1.50)
GFR: 63.53 mL/min (ref >60.00)
Glucose, Bld: 97 mg/dL (ref 70–99)
Potassium: 3.4 mEq/L — ABNORMAL LOW (ref 3.5–5.1)
Sodium: 144 mEq/L (ref 135–145)

## 2018-05-03 LAB — CBC WITH DIFFERENTIAL/PLATELET
BASOS PCT: 0.7 % (ref 0.0–3.0)
Basophils Absolute: 0.1 10*3/uL (ref 0.0–0.1)
Eosinophils Absolute: 0.2 10*3/uL (ref 0.0–0.7)
Eosinophils Relative: 2.8 % (ref 0.0–5.0)
HCT: 45.3 % (ref 39.0–52.0)
Hemoglobin: 15.1 g/dL (ref 13.0–17.0)
Lymphocytes Relative: 15.9 % (ref 12.0–46.0)
Lymphs Abs: 1.3 10*3/uL (ref 0.7–4.0)
MCHC: 33.2 g/dL (ref 30.0–36.0)
MCV: 79.8 fl (ref 78.0–100.0)
Monocytes Absolute: 0.8 10*3/uL (ref 0.1–1.0)
Monocytes Relative: 9.1 % (ref 3.0–12.0)
NEUTROS ABS: 6 10*3/uL (ref 1.4–7.7)
NEUTROS PCT: 71.5 % (ref 43.0–77.0)
Platelets: 198 10*3/uL (ref 150.0–400.0)
RBC: 5.68 Mil/uL (ref 4.22–5.81)
RDW: 16.7 % — AB (ref 11.5–15.5)
WBC: 8.4 10*3/uL (ref 4.0–10.5)

## 2018-05-03 LAB — LIPID PANEL
Cholesterol: 139 mg/dL (ref 0–200)
HDL: 34.5 mg/dL — ABNORMAL LOW (ref >39.00)
LDL Cholesterol: 88 mg/dL (ref 0–99)
NonHDL: 104.5
Total CHOL/HDL Ratio: 4
Triglycerides: 83 mg/dL (ref 0.0–149.0)
VLDL: 16.6 mg/dL (ref 0.0–40.0)

## 2018-05-03 LAB — URINALYSIS, ROUTINE W REFLEX MICROSCOPIC
Bilirubin Urine: NEGATIVE
Hgb urine dipstick: NEGATIVE
Ketones, ur: NEGATIVE
Leukocytes,Ua: NEGATIVE
Nitrite: NEGATIVE
RBC / HPF: NONE SEEN (ref 0–?)
Specific Gravity, Urine: 1.015 (ref 1.000–1.030)
TOTAL PROTEIN, URINE-UPE24: NEGATIVE
URINE GLUCOSE: NEGATIVE
Urobilinogen, UA: 0.2 (ref 0.0–1.0)
pH: 6 (ref 5.0–8.0)

## 2018-05-03 LAB — TSH: TSH: 0.84 u[IU]/mL (ref 0.35–4.50)

## 2018-05-03 LAB — PSA: PSA: 0.05 ng/mL — ABNORMAL LOW (ref 0.10–4.00)

## 2018-05-03 LAB — HEPATIC FUNCTION PANEL
ALT: 13 U/L (ref 0–53)
AST: 14 U/L (ref 0–37)
Albumin: 4 g/dL (ref 3.5–5.2)
Alkaline Phosphatase: 135 U/L — ABNORMAL HIGH (ref 39–117)
Bilirubin, Direct: 0.2 mg/dL (ref 0.0–0.3)
Total Bilirubin: 1.2 mg/dL (ref 0.2–1.2)
Total Protein: 7.2 g/dL (ref 6.0–8.3)

## 2018-05-03 LAB — HEMOGLOBIN A1C: HEMOGLOBIN A1C: 6.4 % (ref 4.6–6.5)

## 2018-05-03 MED ORDER — AZITHROMYCIN 250 MG PO TABS: ORAL_TABLET | ORAL | 1 refills | Status: DC

## 2018-05-03 NOTE — Patient Instructions (Signed)
Please take all new medication as prescribed - the antibiotic  Please continue all other medications as before, and refills have been done if requested.  Please have the pharmacy call with any other refills you may need.  Please continue your efforts at being more active, low cholesterol diet, and weight control.  You are otherwise up to date with prevention measures today.  Please keep your appointments with your specialists as you may have planned  Please go to the LAB in the Basement (turn left off the elevator) for the tests to be done today  You will be contacted by phone if any changes need to be made immediately.  Otherwise, you will receive a letter about your results with an explanation, but please check with MyChart first.  Please remember to sign up for MyChart if you have not done so, as this will be important to you in the future with finding out test results, communicating by private email, and scheduling acute appointments online when needed.  Please return in 1 year for your yearly visit, or sooner if needed, with Lab testing done 3-5 days before

## 2018-05-03 NOTE — Patient Instructions (Signed)
Continue doing brain stimulating activities (puzzles, reading, adult coloring books, staying active) to keep memory sharp.   Continue to eat heart healthy diet (full of fruits, vegetables, whole grains, lean protein, water--limit salt, fat, and sugar intake) and increase physical activity as tolerated.   Aaron Moss , Thank you for taking time to come for your Medicare Wellness Visit. I appreciate your ongoing commitment to your health goals. Please review the following plan we discussed and let me know if I can assist you in the future.   These are the goals we discussed: Goals    . Exercise 150 minutes per week (moderate activity)     Go back 3 days a week for treadmill; stationary bike helps ROM;  Motivated to take back control of your time; will let the children know that 3 days a week, you will not be available  Feels better when you exercise with more energy    . Patient Stated     I want to lose weight by continuing to go to the gym and limit the amount of sugary drinks I consume.       This is a list of the screening recommended for you and due dates:  Health Maintenance  Topic Date Due  . Flu Shot  06/19/2018*  . Colon Cancer Screening  01/11/2023  . Tetanus Vaccine  05/27/2025  .  Hepatitis C: One time screening is recommended by Center for Disease Control  (CDC) for  adults born from [birth year] through 1967.   Completed  . Pneumonia vaccines  Completed  *Topic was postponed. The date shown is not the original due date.    Preventive Care 58 Years and Older, Male Preventive care refers to lifestyle choices and visits with your health care provider that can promote health and wellness. What does preventive care include?   A yearly physical exam. This is also called an annual well check.  Dental exams once or twice a year.  Routine eye exams. Ask your health care provider how often you should have your eyes checked.  Personal lifestyle choices, including: ? Daily care of  your teeth and gums. ? Regular physical activity. ? Eating a healthy diet. ? Avoiding tobacco and drug use. ? Limiting alcohol use. ? Practicing safe sex. ? Taking low doses of aspirin every day. ? Taking vitamin and mineral supplements as recommended by your health care provider. What happens during an annual well check? The services and screenings done by your health care provider during your annual well check will depend on your age, overall health, lifestyle risk factors, and family history of disease. Counseling Your health care provider may ask you questions about your:  Alcohol use.  Tobacco use.  Drug use.  Emotional well-being.  Home and relationship well-being.  Sexual activity.  Eating habits.  History of falls.  Memory and ability to understand (cognition).  Work and work Statistician. Screening You may have the following tests or measurements:  Height, weight, and BMI.  Blood pressure.  Lipid and cholesterol levels. These may be checked every 5 years, or more frequently if you are over 80 years old.  Skin check.  Lung cancer screening. You may have this screening every year starting at age 76 if you have a 30-pack-year history of smoking and currently smoke or have quit within the past 15 years.  Colorectal cancer screening. All adults should have this screening starting at age 23 and continuing until age 11. You will have tests every 1-10  years, depending on your results and the type of screening test. People at increased risk should start screening at an earlier age. Screening tests may include: ? Guaiac-based fecal occult blood testing. ? Fecal immunochemical test (FIT). ? Stool DNA test. ? Virtual colonoscopy. ? Sigmoidoscopy. During this test, a flexible tube with a tiny camera (sigmoidoscope) is used to examine your rectum and lower colon. The sigmoidoscope is inserted through your anus into your rectum and lower colon. ? Colonoscopy. During this  test, a long, thin, flexible tube with a tiny camera (colonoscope) is used to examine your entire colon and rectum.  Prostate cancer screening. Recommendations will vary depending on your family history and other risks.  Hepatitis C blood test.  Hepatitis B blood test.  Sexually transmitted disease (STD) testing.  Diabetes screening. This is done by checking your blood sugar (glucose) after you have not eaten for a while (fasting). You may have this done every 1-3 years.  Abdominal aortic aneurysm (AAA) screening. You may need this if you are a current or former smoker.  Osteoporosis. You may be screened starting at age 66 if you are at high risk. Talk with your health care provider about your test results, treatment options, and if necessary, the need for more tests. Vaccines Your health care provider may recommend certain vaccines, such as:  Influenza vaccine. This is recommended every year.  Tetanus, diphtheria, and acellular pertussis (Tdap, Td) vaccine. You may need a Td booster every 10 years.  Varicella vaccine. You may need this if you have not been vaccinated.  Zoster vaccine. You may need this after age 75.  Measles, mumps, and rubella (MMR) vaccine. You may need at least one dose of MMR if you were born in 1957 or later. You may also need a second dose.  Pneumococcal 13-valent conjugate (PCV13) vaccine. One dose is recommended after age 19.  Pneumococcal polysaccharide (PPSV23) vaccine. One dose is recommended after age 31.  Meningococcal vaccine. You may need this if you have certain conditions.  Hepatitis A vaccine. You may need this if you have certain conditions or if you travel or work in places where you may be exposed to hepatitis A.  Hepatitis B vaccine. You may need this if you have certain conditions or if you travel or work in places where you may be exposed to hepatitis B.  Haemophilus influenzae type b (Hib) vaccine. You may need this if you have certain  risk factors. Talk to your health care provider about which screenings and vaccines you need and how often you need them. This information is not intended to replace advice given to you by your health care provider. Make sure you discuss any questions you have with your health care provider. Document Released: 04/02/2015 Document Revised: 04/26/2017 Document Reviewed: 01/05/2015 Elsevier Interactive Patient Education  2019 [employer].

## 2018-05-03 NOTE — Progress Notes (Signed)
Subjective:    Patient ID: Aaron Moss, male    DOB: 1946/01/28, 73 y.o.   MRN: [id number]  HPI  Here for wellness and f/u;  Overall doing ok;  Pt denies Chest pain, worsening SOB, DOE, wheezing, orthopnea, PND, worsening LE edema, palpitations, dizziness or syncope.  Pt denies neurological change such as new headache, facial or extremity weakness.  Pt denies polydipsia, polyuria, or low sugar symptoms. Pt states overall good compliance with treatment and medications, good tolerability, and has been trying to follow appropriate diet.  Pt denies worsening depressive symptoms, suicidal ideation or panic. No fever, night sweats, wt loss, loss of appetite, or other constitutional symptoms.  Pt states good ability with ADL's, has low fall risk, home safety reviewed and adequate, no other significant changes in hearing or vision, and only occasionally active with exercise. Also incidentally Here with acute onset mild to mod 2-3 days ST, HA, general weakness and malaise, with prod cough greenish sputum.  No other new complaints Past Medical History:  Diagnosis Date  . Colon cancer ([place]) 2000  . Diverticulosis of colon   . History of chemotherapy 2000   colon cancer  . History of colon cancer   . History of radiation therapy 08/06/13- 10/03/13   prostate 7800 cGy 40 sessions, seminal vesicles 5600 cGy 40 sessions  . Hyperlipidemia   . Hypertension   . Left knee DJD   . 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 COLON SURGERY  2000   colon cancer  . COLONOSCOPY    . EUS     pancreatic cyst  . KNEE ARTHROSCOPY  2007   [facility]- [facility]  . PROSTATE BIOPSY  05/23/13   gleason 7, volume 11.5 ml    reports that he quit smoking about 30 years ago. His smoking use included cigarettes. He has a 22.00 pack-year smoking history. He has never used smokeless tobacco. He reports current alcohol use. He reports that he does not use  drugs. family history includes Cancer in his brother, maternal grandmother, and mother; Colon cancer in his sister; Diabetes in his brother. Allergies  Allergen Reactions  . Pregabalin     REACTION: felt bad   Current Outpatient Medications on File Prior to Visit  Medication Sig Dispense Refill  . amLODipine (NORVASC) 10 MG tablet TAKE 1 TABLET BY MOUTH ONCE DAILY 90 tablet 1  . aspirin EC 81 MG tablet Take 81 mg by mouth daily.      Marland Kitchen atorvastatin (LIPITOR) 40 MG tablet TAKE 1 TABLET BY MOUTH ONCE DAILY 90 tablet 3  . benazepril (LOTENSIN) 40 MG tablet TAKE 1 TABLET BY MOUTH ONCE DAILY 90 tablet 1  . diclofenac (VOLTAREN) 75 MG EC tablet TAKE 1 TABLET BY MOUTH TWICE DAILY AS NEEDED FOR PAIN 60 tablet 5  . hydrochlorothiazide (HYDRODIURIL) 25 MG tablet TAKE 1 TABLET BY MOUTH ONCE DAILY 90 tablet 0  . potassium chloride (K-DUR) 10 MEQ tablet TAKE 3 TABLETS BY MOUTH ONCE DAILY 270 tablet 1  . traMADol (ULTRAM) 50 MG tablet TAKE 1 TABLET BY MOUTH EVERY 6 HOURS AS NEEDED FOR PAIN 120 tablet 0  . Travoprost, BAK Free, (TRAVATAN) 0.004 % SOLN ophthalmic solution Place 1 drop into both eyes at bedtime.     No current facility-administered medications on file prior to visit.    Review of Systems Constitutional: Negative for other unusual diaphoresis, sweats, appetite or  weight changes HENT: Negative for other worsening hearing loss, ear pain, facial swelling, mouth sores or neck stiffness.   Eyes: Negative for other worsening pain, redness or other visual disturbance.  Respiratory: Negative for other stridor or swelling Cardiovascular: Negative for other palpitations or other chest pain  Gastrointestinal: Negative for worsening diarrhea or loose stools, blood in stool, distention or other pain Genitourinary: Negative for hematuria, flank pain or other change in urine volume.  Musculoskeletal: Negative for myalgias or other joint swelling.  Skin: Negative for other color change, or other wound  or worsening drainage.  Neurological: Negative for other syncope or numbness. Hematological: Negative for other adenopathy or swelling Psychiatric/Behavioral: Negative for hallucinations, other worsening agitation, SI, self-injury, or new decreased concentration All other system neg per pt    Objective:   Physical Exam BP (!) 141/88   Pulse 77   Temp 98.9 F (37.2 C) (Oral)   Ht 5\' 6"  (1.676 m)   Wt 269 lb (122 kg)   SpO2 99%   BMI 43.42 kg/m  VS noted, mild ill Constitutional: Pt is oriented to person, place, and time. Appears well-developed and well-nourished, in no significant distress and comfortable Head: Normocephalic and atraumatic  Eyes: Conjunctivae and EOM are normal. Pupils are equal, round, and reactive to light Right Ear: External ear normal without discharge Left Ear: External ear normal without discharge Nose: Nose without discharge or deformity Bilat tm's with mild erythema.  Max sinus areas mild tender.  Pharynx with mild erythema, no exudate Mouth/Throat: Oropharynx is without other ulcerations and moist  Neck: Normal range of motion. Neck supple. No JVD present. No tracheal deviation present or significant neck LA or mass Cardiovascular: Normal rate, regular rhythm, normal heart sounds and intact distal pulses.  Pulmonary/Chest: WOB normal and breath sounds without rales or wheezing  Abdominal: Soft. Bowel sounds are normal. NT. No HSM  Musculoskeletal: Normal range of motion. Exhibits no edema Lymphadenopathy: Has no other cervical adenopathy.  Neurological: Pt is alert and oriented to person, place, and time. Pt has normal reflexes. No cranial nerve deficit. Motor grossly intact, Gait intact Skin: Skin is warm and dry. No rash noted or new ulcerations Psychiatric:  Has normal mood and affect. Behavior is normal without agitation No other exam findings Lab Results  Component Value Date   WBC 8.4 05/03/2018   HGB 15.1 05/03/2018   HCT 45.3 05/03/2018   PLT  198.0 05/03/2018   GLUCOSE 97 05/03/2018   CHOL 139 05/03/2018   TRIG 83.0 05/03/2018   HDL 34.50 (L) 05/03/2018   LDLDIRECT 185.6 05/15/2007   LDLCALC 88 05/03/2018   ALT 13 05/03/2018   AST 14 05/03/2018   NA 144 05/03/2018   K 3.4 (L) 05/03/2018   CL 106 05/03/2018   CREATININE 1.33 05/03/2018   BUN 13 05/03/2018   CO2 29 05/03/2018   TSH 0.84 05/03/2018   PSA 0.05 (L) 05/03/2018   HGBA1C 6.4 05/03/2018       Assessment & Plan:

## 2018-05-03 NOTE — Progress Notes (Addendum)
Subjective:   Aaron Moss is a 73 y.o. male who presents for Medicare Annual/Subsequent preventive examination.  Review of Systems:  No ROS.  Medicare Wellness Visit. Additional risk factors are reflected in the social history.  Cardiac Risk Factors include: advanced age (>35men, >60 women);dyslipidemia;hypertension;male gender Sleep patterns: feels rested on waking, gets up 1-2 times nightly to void and sleeps 7 hours nightly.    Home Safety/Smoke Alarms: Feels safe in home. Smoke alarms in place.  Living environment; residence and Firearm Safety: 1-story house/ trailer. Lives with wife, no needs for DME, good support system Seat Belt Safety/Bike Helmet: Wears seat belt.   PSA-  Lab Results  Component Value Date   PSA 0.16 06/01/2016   PSA 5.25 (H) 03/05/2013   PSA 3.08 09/04/2012       Objective:    Vitals: BP (!) 141/88   Pulse 77   Temp 98.9 F (37.2 C)   Resp 17   Ht 5\' 6"  (1.676 m)   Wt 269 lb (122 kg)   SpO2 99%   BMI 43.42 kg/m   Body mass index is 43.42 kg/m.  Advanced Directives 05/03/2018 03/23/2015 02/05/2015 07/02/2013  Does Patient Have a Medical Advance Directive? No No Yes Patient does not have advance directive;Patient would not like information  Copy of [place] in Chart? - - Yes -  Would patient like information on creating a medical advance directive? No - Patient declined No - patient declined information - -    Tobacco Social History   Tobacco Use  Smoking Status Former Smoker  . Packs/day: 1.00  . Years: 22.00  . Pack years: 22.00  . Types: Cigarettes  . Last attempt to quit: 03/20/1988  . Years since quitting: 30.1  Smokeless Tobacco Never Used     Counseling given: Not Answered  Past Medical History:  Diagnosis Date  . Colon cancer ([place]) 2000  . Diverticulosis of colon   . History of chemotherapy 2000   colon cancer  . History of colon cancer   . History of radiation therapy 08/06/13- 10/03/13   prostate  7800 cGy 40 sessions, seminal vesicles 5600 cGy 40 sessions  . Hyperlipidemia   . Hypertension   . Left knee DJD   . 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 COLON SURGERY  2000   colon cancer  . COLONOSCOPY    . EUS     pancreatic cyst  . KNEE ARTHROSCOPY  2007   [facility]  . PROSTATE BIOPSY  05/23/13   gleason 7, volume 11.5 ml   Family History  Problem Relation Age of Onset  . Cancer Mother        Liver  . Cancer Brother        Lung  . Diabetes Brother   . Colon cancer Sister   . Cancer Maternal Grandmother        Breast  . Esophageal cancer Neg Hx   . Rectal cancer Neg Hx   . Stomach cancer Neg Hx    Social History   Socioeconomic History  . Marital status: Married    Spouse name: Not on file  . Number of children: Not on file  . Years of education: Not on file  . Highest education level: Not on file  Occupational History  . Not on file  Social Needs  . Financial resource strain: Not hard  at all  . Food insecurity:    Worry: Never true    Inability: Never true  . Transportation needs:    Medical: No    Non-medical: No  Tobacco Use  . Smoking status: Former Smoker    Packs/day: 1.00    Years: 22.00    Pack years: 22.00    Types: Cigarettes    Last attempt to quit: 03/20/1988    Years since quitting: 30.1  . Smokeless tobacco: Never Used  Substance and Sexual Activity  . Alcohol use: Yes    Alcohol/week: 0.0 standard drinks    Comment: rare  . Drug use: No  . Sexual activity: Not Currently  Lifestyle  . Physical activity:    Days per week: 3 days    Minutes per session: 60 min  . Stress: Not at all  Relationships  . Social connections:    Talks on phone: More than three times a week    Gets together: More than three times a week    Attends religious service: 1 to 4 times per year    Active member of club or organization: Yes    Attends meetings of clubs or  organizations: More than 4 times per year    Relationship status: Married  Other Topics Concern  . Not on file  Social History Narrative  . Not on file    Outpatient Encounter Medications as of 05/03/2018  Medication Sig  . amLODipine (NORVASC) 10 MG tablet TAKE 1 TABLET BY MOUTH ONCE DAILY  . aspirin EC 81 MG tablet Take 81 mg by mouth daily.    Marland Kitchen atorvastatin (LIPITOR) 40 MG tablet TAKE 1 TABLET BY MOUTH ONCE DAILY  . benazepril (LOTENSIN) 40 MG tablet TAKE 1 TABLET BY MOUTH ONCE DAILY  . diclofenac (VOLTAREN) 75 MG EC tablet TAKE 1 TABLET BY MOUTH TWICE DAILY AS NEEDED FOR PAIN  . hydrochlorothiazide (HYDRODIURIL) 25 MG tablet TAKE 1 TABLET BY MOUTH ONCE DAILY  . potassium chloride (K-DUR) 10 MEQ tablet TAKE 3 TABLETS BY MOUTH ONCE DAILY  . traMADol (ULTRAM) 50 MG tablet TAKE 1 TABLET BY MOUTH EVERY 6 HOURS AS NEEDED FOR PAIN  . Travoprost, BAK Free, (TRAVATAN) 0.004 % SOLN ophthalmic solution Place 1 drop into both eyes at bedtime.   No facility-administered encounter medications on file as of 05/03/2018.     Activities of Daily Living In your present state of health, do you have any difficulty performing the following activities: 05/03/2018  Hearing? N  Vision? N  Difficulty concentrating or making decisions? N  Walking or climbing stairs? N  Dressing or bathing? N  Doing errands, shopping? N  Preparing Food and eating ? N  Using the Toilet? N  In the past six months, have you accidently leaked urine? N  Do you have problems with loss of bowel control? N  Managing your Medications? N  Managing your Finances? N  Housekeeping or managing your Housekeeping? N  Some recent data might be hidden    Patient Care Team: Biagio Borg, MD as PCP - General   Assessment:   This is a routine wellness examination for Aaron Moss. Physical assessment deferred to PCP.  Exercise Activities and Dietary recommendations Current Exercise Habits: Structured exercise class, Type of exercise:  strength training/weights(stationary bike), Time (Minutes): 60, Frequency (Times/Week): 3, Weekly Exercise (Minutes/Week): 180, Intensity: Mild, Exercise limited by: orthopedic condition(s)  Diet (meal preparation, eat out, water intake, caffeinated beverages, dairy products, fruits and vegetables): in general, a "healthy"  diet     Reviewed heart healthy diet. Encouraged patient to increase daily water and healthy fluid intake. Discussed weight loss strategies.   Goals    . Exercise 150 minutes per week (moderate activity)     Go back 3 days a week for treadmill; stationary bike helps ROM;  Motivated to take back control of your time; will let the children know that 3 days a week, you will not be available  Feels better when you exercise with more energy    . Patient Stated     I want to lose weight by continuing to go to the gym and limit the amount of sugary drinks I consume.       Fall Risk Fall Risk  05/03/2018 06/11/2017 06/01/2016 05/28/2015 02/05/2015  Falls in the past year? 0 No No No No  Number falls in past yr: - - - - -  Injury with Fall? - - - - -  Comment - - - - -   Depression Screen PHQ 2/9 Scores 05/03/2018 06/11/2017 12/05/2016 06/01/2016  PHQ - 2 Score 0 0 0 0  PHQ- 9 Score - - 0 -    Cognitive Function MMSE - Mini Mental State Exam 02/05/2015  Not completed: (No Data)       Ad8 score reviewed for issues:  Issues making decisions: no  Less interest in hobbies / activities: no  Repeats questions, stories (family complaining): no  Trouble using ordinary gadgets (microwave, computer, phone):no  Forgets the month or year: no  Mismanaging finances: no  Remembering appts: no  Daily problems with thinking and/or memory: no Ad8 score is= 0  Immunization History  Administered Date(s) Administered  . Influenza Whole 04/30/2009, 04/25/2010  . Influenza, High Dose Seasonal PF 12/05/2016  . Influenza,inj,Quad PF,6+ Mos 03/05/2013, 04/27/2014, 11/30/2015  .  Influenza-Unspecified 11/19/2014  . Pneumococcal Conjugate-13 03/26/2013  . Pneumococcal Polysaccharide-23 06/08/2009  . Td 03/21/2003  . Tdap 05/28/2015  . Zoster 02/19/2006   Screening Tests Health Maintenance  Topic Date Due  . INFLUENZA VACCINE  06/19/2018 (Originally 10/18/2017)  . COLONOSCOPY  01/11/2023  . TETANUS/TDAP  05/27/2025  . Hepatitis C Screening  Completed  . PNA vac Low Risk Adult  Completed       Plan:     Reviewed health maintenance screenings with patient today and relevant education, vaccines, and/or referrals were provided.   Continue doing brain stimulating activities (puzzles, reading, adult coloring books, staying active) to keep memory sharp.   Continue to eat heart healthy diet (full of fruits, vegetables, whole grains, lean protein, water--limit salt, fat, and sugar intake) and increase physical activity as tolerated.  I have personally reviewed and noted the following in the patient's chart:   . Medical and social history . Use of alcohol, tobacco or illicit drugs  . Current medications and supplements . Functional ability and status . Nutritional status . Physical activity . Advanced directives . List of other physicians . Vitals . Screenings to include cognitive, depression, and falls . Referrals and appointments  In addition, I have reviewed and discussed with patient certain preventive protocols, quality metrics, and best practice recommendations. A written personalized care plan for preventive services as well as general preventive health recommendations were provided to patient.     Michiel Cowboy, RN  05/03/2018  Medical screening examination/treatment/procedure(s) were performed by non-physician practitioner and as supervising physician I was immediately available for consultation/collaboration. I agree with above. Cathlean Cower, MD

## 2018-05-04 NOTE — Assessment & Plan Note (Signed)

## 2018-05-04 NOTE — Assessment & Plan Note (Signed)
stable overall by history and exam, recent data reviewed with pt, and pt to continue medical treatment as before,  to f/u any worsening symptoms or concerns  

## 2018-05-04 NOTE — Assessment & Plan Note (Signed)
Mild to mod, c/w bronchitis vs pna, delcines cxr, for antibx course,  to f/u any worsening symptoms or concerns

## 2018-05-09 ENCOUNTER — Other Ambulatory Visit: Payer: Self-pay | Admitting: Internal Medicine

## 2018-05-17 ENCOUNTER — Other Ambulatory Visit: Payer: Self-pay | Admitting: Internal Medicine

## 2018-05-17 NOTE — Telephone Encounter (Signed)
Done erx 

## 2018-05-28 ENCOUNTER — Other Ambulatory Visit: Payer: Self-pay | Admitting: Internal Medicine

## 2018-06-09 ENCOUNTER — Other Ambulatory Visit: Payer: Self-pay | Admitting: Internal Medicine

## 2018-07-11 ENCOUNTER — Other Ambulatory Visit: Payer: Self-pay | Admitting: Internal Medicine

## 2018-08-07 ENCOUNTER — Other Ambulatory Visit: Payer: Self-pay | Admitting: Internal Medicine

## 2018-08-21 ENCOUNTER — Other Ambulatory Visit: Payer: Self-pay | Admitting: Internal Medicine

## 2018-09-11 ENCOUNTER — Other Ambulatory Visit: Payer: Self-pay | Admitting: Internal Medicine

## 2018-10-01 ENCOUNTER — Other Ambulatory Visit: Payer: Self-pay | Admitting: Internal Medicine

## 2018-10-01 NOTE — Telephone Encounter (Signed)
Done erx 

## 2018-11-08 ENCOUNTER — Other Ambulatory Visit: Payer: Self-pay | Admitting: Internal Medicine

## 2018-11-17 ENCOUNTER — Other Ambulatory Visit: Payer: Self-pay | Admitting: Internal Medicine

## 2018-12-24 ENCOUNTER — Other Ambulatory Visit: Payer: Self-pay

## 2018-12-24 ENCOUNTER — Ambulatory Visit (INDEPENDENT_AMBULATORY_CARE_PROVIDER_SITE_OTHER): Payer: Medicare Other | Admitting: Internal Medicine

## 2018-12-24 ENCOUNTER — Encounter: Payer: Self-pay | Admitting: Internal Medicine

## 2018-12-24 VITALS — BP 142/86 | HR 86 | Temp 98.5°F | Ht 66.0 in | Wt 267.0 lb

## 2018-12-24 DIAGNOSIS — I10 Essential (primary) hypertension: Secondary | ICD-10-CM | POA: Diagnosis not present

## 2018-12-24 DIAGNOSIS — L989 Disorder of the skin and subcutaneous tissue, unspecified: Secondary | ICD-10-CM

## 2018-12-24 DIAGNOSIS — R739 Hyperglycemia, unspecified: Secondary | ICD-10-CM | POA: Diagnosis not present

## 2018-12-24 NOTE — Progress Notes (Signed)
Subjective:    Patient ID: Aaron Moss, male    DOB: Jul 23, 1945, 73 y.o.   MRN: [id number]  HPI  Here after wife noticed a mole near an itchy area of the right mid back, wondering if needs to be checked.  No rash and no change in mole he is aware.  Pt denies chest pain, increased sob or doe, wheezing, orthopnea, PND, increased LE swelling, palpitations, dizziness or syncope.   Pt denies polydipsia, polyuria.  Pt denies new neurological symptoms such as new headache, or facial or extremity weakness or numbness BP Readings from Last 3 Encounters:  12/24/18 (!) 142/86  05/03/18 (!) 141/88  05/03/18 (!) 141/88   Wt Readings from Last 3 Encounters:  12/24/18 267 lb (121.1 kg)  05/03/18 269 lb (122 kg)  05/03/18 269 lb (122 kg)   Past Medical History:  Diagnosis Date  . Colon cancer ([place]) 2000  . Diverticulosis of colon   . History of chemotherapy 2000   colon cancer  . History of colon cancer   . History of radiation therapy 08/06/13- 10/03/13   prostate 7800 cGy 40 sessions, seminal vesicles 5600 cGy 40 sessions  . Hyperlipidemia   . Hypertension   . Left knee DJD   . 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 COLON SURGERY  2000   colon cancer  . COLONOSCOPY    . EUS     pancreatic cyst  . KNEE ARTHROSCOPY  2007   [facility]  . PROSTATE BIOPSY  05/23/13   gleason 7, volume 11.5 ml    reports that he quit smoking about 30 years ago. His smoking use included cigarettes. He has a 22.00 pack-year smoking history. He has never used smokeless tobacco. He reports current alcohol use. He reports that he does not use drugs. family history includes Cancer in his brother, maternal grandmother, and mother; Colon cancer in his sister; Diabetes in his brother. Allergies  Allergen Reactions  . Pregabalin     REACTION: felt bad   Current Outpatient Medications on File Prior to Visit  Medication Sig Dispense  Refill  . amLODipine (NORVASC) 10 MG tablet Take 1 tablet by mouth once daily 90 tablet 3  . aspirin EC 81 MG tablet Take 81 mg by mouth daily.      Marland Kitchen atorvastatin (LIPITOR) 40 MG tablet Take 1 tablet by mouth once daily 90 tablet 3  . benazepril (LOTENSIN) 40 MG tablet Take 1 tablet by mouth once daily 90 tablet 1  . diclofenac (VOLTAREN) 75 MG EC tablet Take 1 tablet by mouth twice daily as needed for pain 60 tablet 0  . hydrochlorothiazide (HYDRODIURIL) 25 MG tablet Take 1 tablet by mouth once daily 90 tablet 3  . potassium chloride (K-DUR) 10 MEQ tablet Take 3 tablets by mouth once daily 270 tablet 1  . traMADol (ULTRAM) 50 MG tablet TAKE 1 TABLET BY MOUTH EVERY 6 HOURS AS NEEDED FOR PAIN 120 tablet 2  . Travoprost, BAK Free, (TRAVATAN) 0.004 % SOLN ophthalmic solution Place 1 drop into both eyes at bedtime.     No current facility-administered medications on file prior to visit.    Review of Systems  Constitutional: Negative for other unusual diaphoresis or sweats HENT: Negative for ear discharge or swelling Eyes: Negative for other worsening visual disturbances Respiratory: Negative for stridor or other swelling  Gastrointestinal: Negative for worsening distension or  other blood Genitourinary: Negative for retention or other urinary change Musculoskeletal: Negative for other MSK pain or swelling Skin: Negative for color change or other new lesions Neurological: Negative for worsening tremors and other numbness  Psychiatric/Behavioral: Negative for worsening agitation or other fatigue All otherwise neg per pt     Objective:   Physical Exam BP (!) 142/86   Pulse 86   Temp 98.5 F (36.9 C) (Oral)   Ht 5\' 6"  (1.676 m)   Wt 267 lb (121.1 kg)   SpO2 97%   BMI 43.09 kg/m  VS noted,  Constitutional: Pt appears in NAD HENT: Head: NCAT.  Right Ear: External ear normal.  Left Ear: External ear normal.  Eyes: . Pupils are equal, round, and reactive to light. Conjunctivae and EOM  are normal Nose: without d/c or deformity Neck: Neck supple. Gross normal ROM Cardiovascular: Normal rate and regular rhythm.   Pulmonary/Chest: Effort normal and breath sounds without rales or wheezing.  Abd:  Soft, NT, ND, + BS, no organomegaly Neurological: Pt is alert. At baseline orientation, motor grossly intact Skin: Skin is warm. No rashes,  no LE edema, has a subq nodule possible lipoma to right paraspinal about t12, also 2 cm above has 10  X 4  Mm nonraised mole tan, single hue, regular edges Psychiatric: Pt behavior is normal without agitation  No other exam findings Lab Results  Component Value Date   WBC 8.4 05/03/2018   HGB 15.1 05/03/2018   HCT 45.3 05/03/2018   PLT 198.0 05/03/2018   GLUCOSE 97 05/03/2018   CHOL 139 05/03/2018   TRIG 83.0 05/03/2018   HDL 34.50 (L) 05/03/2018   LDLDIRECT 185.6 05/15/2007   LDLCALC 88 05/03/2018   ALT 13 05/03/2018   AST 14 05/03/2018   NA 144 05/03/2018   K 3.4 (L) 05/03/2018   CL 106 05/03/2018   CREATININE 1.33 05/03/2018   BUN 13 05/03/2018   CO2 29 05/03/2018   TSH 0.84 05/03/2018   PSA 0.05 (L) 05/03/2018   HGBA1C 6.4 05/03/2018       Assessment & Plan:

## 2018-12-24 NOTE — Assessment & Plan Note (Signed)
stable overall by history and exam, recent data reviewed with pt, and pt to continue medical treatment as before,  to f/u any worsening symptoms or concerns  

## 2018-12-24 NOTE — Patient Instructions (Signed)
Ok to continue to observe the mole and the skin nodule to the back as these appear to be benign  Please continue to monitor your Blood Pressure on a regular basis with the goal of < 140/90  Please continue all other medications as before, and refills have been done if requested.  Please have the pharmacy call with any other refills you may need.  Please continue your efforts at being more active, low cholesterol diet, and weight control  Please keep your appointments with your specialists as you may have planned

## 2018-12-24 NOTE — Assessment & Plan Note (Signed)
C/w benign mole, reassured, no further evalutation needed

## 2019-01-13 ENCOUNTER — Other Ambulatory Visit: Payer: Self-pay | Admitting: Internal Medicine

## 2019-02-03 ENCOUNTER — Other Ambulatory Visit: Payer: Self-pay

## 2019-02-03 ENCOUNTER — Ambulatory Visit: Payer: Self-pay

## 2019-02-03 DIAGNOSIS — N390 Urinary tract infection, site not specified: Secondary | ICD-10-CM | POA: Diagnosis not present

## 2019-02-03 DIAGNOSIS — R300 Dysuria: Secondary | ICD-10-CM | POA: Diagnosis present

## 2019-02-03 DIAGNOSIS — Z79899 Other long term (current) drug therapy: Secondary | ICD-10-CM | POA: Diagnosis not present

## 2019-02-03 DIAGNOSIS — Z85038 Personal history of other malignant neoplasm of large intestine: Secondary | ICD-10-CM | POA: Diagnosis not present

## 2019-02-03 DIAGNOSIS — Z7982 Long term (current) use of aspirin: Secondary | ICD-10-CM | POA: Insufficient documentation

## 2019-02-03 DIAGNOSIS — Z87891 Personal history of nicotine dependence: Secondary | ICD-10-CM | POA: Diagnosis not present

## 2019-02-03 DIAGNOSIS — Z8546 Personal history of malignant neoplasm of prostate: Secondary | ICD-10-CM | POA: Insufficient documentation

## 2019-02-03 DIAGNOSIS — I10 Essential (primary) hypertension: Secondary | ICD-10-CM | POA: Insufficient documentation

## 2019-02-03 NOTE — Telephone Encounter (Signed)
  Outgoing call to Patient, who complains of Frequency of urine and burning.   Onset was Saturday.   Reports Burning after urinating.    That's the pain he reports.  Denies flank Pain.  Denies blood in urine.  Attempted to make appointment with Dr.  Cathlean Cower.  No appointment available.  Call to [facility] office for availability.   Transferred call to [facility] to schedule appointment.         Reason for Disposition . Blood in urine (red, pink, or tea-colored)  Answer Assessment - Initial Assessment Questions 1. SYMPTOM: "What's the main symptom you're concerned about?" (e.g., frequency, incontinence)    Frequency burring.   2. ONSET: "When did the  *No Answer*  start?"     Saturday night.   3. PAIN: "Is there any pain?" If so, ask: "How bad is it?" (Scale: 1-10; mild, moderate, severe)    Just burning when voiding witha  little buring 4. CAUSE: "What do you think is causing the symptoms?"     *No Answer* 5. OTHER SYMPTOMS: "Do you have any other symptoms?" (e.g., fever, flank pain, blood in urine, pain with urination)    Denies blood denies fever denies flank pain 6. PREGNANCY: "Is there any chance you are pregnant?" "When was your last menstrual period?"     na  Protocols used: PREGNANCY - URINATION PAIN-A-AH, URINARY [facility]

## 2019-02-03 NOTE — Telephone Encounter (Signed)
We have no appointment today, Was you able to work patient in at your office? It states patient was transferred to your office to be scheduled?

## 2019-02-04 ENCOUNTER — Encounter (HOSPITAL_COMMUNITY): Payer: Self-pay | Admitting: Emergency Medicine

## 2019-02-04 ENCOUNTER — Other Ambulatory Visit: Payer: Self-pay

## 2019-02-04 ENCOUNTER — Emergency Department (HOSPITAL_COMMUNITY)
Admission: EM | Admit: 2019-02-04 | Discharge: 2019-02-04 | Disposition: A | Discharge status: Home / Self Care | Payer: Medicare Other | Attending: Emergency Medicine | Admitting: Emergency Medicine

## 2019-02-04 DIAGNOSIS — N390 Urinary tract infection, site not specified: Secondary | ICD-10-CM

## 2019-02-04 LAB — URINALYSIS, ROUTINE W REFLEX MICROSCOPIC
Bilirubin Urine: NEGATIVE
Glucose, UA: NEGATIVE mg/dL
Ketones, ur: NEGATIVE mg/dL
Nitrite: NEGATIVE
Protein, ur: 100 mg/dL — AB
Specific Gravity, Urine: 1.012 (ref 1.005–1.030)
pH: 8 (ref 5.0–8.0)

## 2019-02-04 MED ORDER — NITROFURANTOIN MONOHYD MACRO 100 MG PO CAPS
100.0000 mg | ORAL_CAPSULE | Freq: Once | ORAL | Status: AC
  Administered 2019-02-04: 06:00:00 100 mg via ORAL
  Filled 2019-02-04: qty 1

## 2019-02-04 MED ORDER — NITROFURANTOIN MONOHYD MACRO 100 MG PO CAPS: 100.0000 mg | ORAL_CAPSULE | Freq: Two times a day (BID) | ORAL | 0 refills | Status: DC

## 2019-02-04 NOTE — ED Provider Notes (Signed)
TIME SEEN: 4:41 AM  CHIEF COMPLAINT: Dysuria  HPI: Patient is a 73 year old male with history of previous colon cancer status post chemotherapy and prostate cancer status post radiation, hypertension and hyperlipidemia who presents to the emergency department with dysuria.  Reports he feels he is able to empty his bladder fully but feels he has to urinate repeatedly and feels like he is never done urinating.  No penile discharge but did see a small amount of blood in the toilet today.  No abdominal pain, flank pain, fevers, nausea, vomiting or diarrhea.  Has not been sexually active in over a year.  No history of STDs.  ROS: See HPI Constitutional: no fever  Eyes: no drainage  ENT: no runny nose   Cardiovascular:  no chest pain  Resp: no SOB  GI: no vomiting GU: no dysuria Integumentary: no rash  Allergy: no hives  Musculoskeletal: no leg swelling  Neurological: no slurred speech ROS otherwise negative  PAST MEDICAL HISTORY/PAST SURGICAL HISTORY:  Past Medical History:  Diagnosis Date  . Colon cancer ([place]) 2000  . Diverticulosis of colon   . History of chemotherapy 2000   colon cancer  . History of colon cancer   . History of radiation therapy 08/06/13- 10/03/13   prostate 7800 cGy 40 sessions, seminal vesicles 5600 cGy 40 sessions  . Hyperlipidemia   . Hypertension   . Left knee DJD   . Prostate cancer ([place]) 05/23/13   gleason 3+4=7, volume 11.5 ml    MEDICATIONS:  Prior to Admission medications   Medication Sig Start Date End Date Taking? Authorizing Provider  amLODipine (NORVASC) 10 MG tablet Take 1 tablet by mouth once daily 06/10/18   Biagio Borg, MD  aspirin EC 81 MG tablet Take 81 mg by mouth daily.      [provider]  atorvastatin (LIPITOR) 40 MG tablet Take 1 tablet by mouth once daily 05/29/18   Biagio Borg, MD  benazepril (LOTENSIN) 40 MG tablet Take 1 tablet by mouth once daily 11/08/18   Biagio Borg, MD  diclofenac (VOLTAREN) 75 MG EC tablet Take 1  tablet by mouth twice daily as needed for pain 01/13/19   Biagio Borg, MD  hydrochlorothiazide (HYDRODIURIL) 25 MG tablet Take 1 tablet by mouth once daily 05/29/18   Biagio Borg, MD  potassium chloride (K-DUR) 10 MEQ tablet Take 3 tablets by mouth once daily 11/18/18   Biagio Borg, MD  traMADol (ULTRAM) 50 MG tablet TAKE 1 TABLET BY MOUTH EVERY 6 HOURS AS NEEDED FOR PAIN 10/01/18   Biagio Borg, MD  Travoprost, BAK Free, (TRAVATAN) 0.004 % SOLN ophthalmic solution Place 1 drop into both eyes at bedtime.    [provider]    ALLERGIES:  Allergies  Allergen Reactions  . Pregabalin     REACTION: felt bad    SOCIAL HISTORY:  Social History   Tobacco Use  . Smoking status: Former Smoker    Packs/day: 1.00    Years: 22.00    Pack years: 22.00    Types: Cigarettes    Quit date: 03/20/1988    Years since quitting: 30.8  . Smokeless tobacco: Never Used  Substance Use Topics  . Alcohol use: Yes    Alcohol/week: 0.0 standard drinks    Comment: rare    FAMILY HISTORY: Family History  Problem Relation Age of Onset  . Cancer Mother        Liver  . Cancer Brother  Lung  . Diabetes Brother   . Colon cancer Sister   . Cancer Maternal Grandmother        Breast  . Esophageal cancer Neg Hx   . Rectal cancer Neg Hx   . Stomach cancer Neg Hx     EXAM: BP (!) 172/77 (BP Location: Left Arm)   Pulse 79   Temp 98.7 F (37.1 C) (Oral)   Resp 18   Ht 5\' 6"  (1.676 m)   Wt 117.9 kg   SpO2 100%   BMI 41.97 kg/m  CONSTITUTIONAL: Alert and oriented and responds appropriately to questions. Well-appearing; well-nourished HEAD: Normocephalic EYES: Conjunctivae clear, pupils appear equal, EOMI ENT: normal nose; moist mucous membranes NECK: Supple, no meningismus, no nuchal rigidity, no LAD  CARD: RRR; S1 and S2 appreciated; no murmurs, no clicks, no rubs, no gallops RESP: Normal chest excursion without splinting or tachypnea; breath sounds clear and equal bilaterally;  no wheezes, no rhonchi, no rales, no hypoxia or respiratory distress, speaking full sentences ABD/GI: Normal bowel sounds; non-distended; soft, non-tender, no rebound, no guarding, no peritoneal signs, no hepatosplenomegaly BACK:  The back appears normal and is non-tender to palpation, there is no CVA tenderness EXT: Normal ROM in all joints; non-tender to palpation; no edema; normal capillary refill; no cyanosis, no calf tenderness or swelling    SKIN: Normal color for age and race; warm; no rash NEURO: Moves all extremities equally, normal speech, ambulatory PSYCH: The patient's mood and manner are appropriate. Grooming and personal hygiene are appropriate.  MEDICAL DECISION MAKING: Patient here with UTI.  Urine culture pending.  Bladder scan reveals 35 mL in the bladder.  He has previously grown Staphylococcus, E. coli in his urine cultures.  Both are sensitive to Macrobid.  Given I am not concerned for pyelonephritis, I feel Macrobid is an appropriate antibiotic.  Will discharge with prescription for the same.  He has PCP for follow-up.  Discussed return precautions.  At this time, I do not feel there is any life-threatening condition present. I have reviewed, interpreted and discussed all results (EKG, imaging, lab, urine as appropriate) and exam findings with patient/family. I have reviewed nursing notes and appropriate previous records.  I feel the patient is safe to be discharged home without further emergent workup and can continue workup as an outpatient as needed. Discussed usual and customary return precautions. Patient/family verbalize understanding and are comfortable with this plan.  Outpatient follow-up has been provided as needed. All questions have been answered.   Aaron Moss was evaluated in Emergency Department on 02/04/2019 for the symptoms described in the history of present illness. He was evaluated in the context of the global COVID-19 pandemic, which necessitated  consideration that the patient might be at risk for infection with the SARS-CoV-2 virus that causes COVID-19. Institutional protocols and algorithms that pertain to the evaluation of patients at risk for COVID-19 are in a state of rapid change based on information released by regulatory bodies including the CDC and federal and state organizations. These policies and algorithms were followed during the patient's care in the ED.    Jackie Russman, Delice Bison, DO 02/04/19 [phone]

## 2019-02-04 NOTE — ED Triage Notes (Signed)
Patient complaining of urinary retention.

## 2019-02-06 LAB — URINE CULTURE: Culture: 80000 — AB

## 2019-02-07 ENCOUNTER — Telehealth: Payer: Self-pay | Admitting: *Deleted

## 2019-02-07 NOTE — Telephone Encounter (Signed)
Post ED Visit - Positive Culture Follow-up  Culture report reviewed by antimicrobial stewardship pharmacist: [place] Team []  Elenor Quinones, Pharm.D. []  Heide Guile, Pharm.D., BCPS AQ-ID []  Parks Neptune, Pharm.D., BCPS []  Alycia Rossetti, Pharm.D., BCPS []  [place], Pharm.D., BCPS, AAHIVP []  Legrand Como, Pharm.D., BCPS, AAHIVP []  Salome Arnt, PharmD, BCPS []  Johnnette Gourd, PharmD, BCPS []  Hughes Better, PharmD, BCPS []  Leeroy Cha, PharmD []  Laqueta Linden, PharmD, BCPS []  Albertina Parr, PharmD  [place] Team [x]  Leodis Sias, PharmD []  Lindell Spar, PharmD []  Royetta Asal, PharmD []  Graylin Shiver, Rph []  Rema Fendt) Glennon Mac, PharmD []  Arlyn Dunning, PharmD []  Netta Cedars, PharmD []  Dia Sitter, PharmD []  Leone Haven, PharmD []  Gretta Arab, PharmD []  Theodis Shove, PharmD []  Peggyann Juba, PharmD []  Reuel Boom, PharmD   Positive urine culture Treated with Nitrofurantoin Monohyd Macro, organism sensitive to the same and no further patient follow-up is required at this time.  Harlon Flor [facility] 02/07/2019, 2:07 PM

## 2019-02-18 ENCOUNTER — Other Ambulatory Visit: Payer: Self-pay | Admitting: Internal Medicine

## 2019-02-18 NOTE — Telephone Encounter (Signed)
Done erx 

## 2019-02-24 ENCOUNTER — Other Ambulatory Visit: Payer: Self-pay

## 2019-02-24 ENCOUNTER — Encounter (HOSPITAL_COMMUNITY): Payer: Self-pay

## 2019-02-24 ENCOUNTER — Emergency Department (HOSPITAL_COMMUNITY)
Admission: EM | Admit: 2019-02-24 | Discharge: 2019-02-24 | Disposition: A | Discharge status: Home / Self Care | Payer: Medicare Other | Attending: Emergency Medicine | Admitting: Emergency Medicine

## 2019-02-24 DIAGNOSIS — Z87891 Personal history of nicotine dependence: Secondary | ICD-10-CM | POA: Diagnosis not present

## 2019-02-24 DIAGNOSIS — Z7982 Long term (current) use of aspirin: Secondary | ICD-10-CM | POA: Insufficient documentation

## 2019-02-24 DIAGNOSIS — N3001 Acute cystitis with hematuria: Secondary | ICD-10-CM | POA: Insufficient documentation

## 2019-02-24 DIAGNOSIS — Z79899 Other long term (current) drug therapy: Secondary | ICD-10-CM | POA: Diagnosis not present

## 2019-02-24 DIAGNOSIS — I10 Essential (primary) hypertension: Secondary | ICD-10-CM | POA: Diagnosis not present

## 2019-02-24 DIAGNOSIS — R300 Dysuria: Secondary | ICD-10-CM | POA: Diagnosis present

## 2019-02-24 LAB — CBC WITH DIFFERENTIAL/PLATELET
Abs Immature Granulocytes: 0.04 10*3/uL (ref 0.00–0.07)
Basophils Absolute: 0.1 10*3/uL (ref 0.0–0.1)
Basophils Relative: 1 %
Eosinophils Absolute: 0.1 10*3/uL (ref 0.0–0.5)
Eosinophils Relative: 2 %
HCT: 47.5 % (ref 39.0–52.0)
Hemoglobin: 15 g/dL (ref 13.0–17.0)
Immature Granulocytes: 1 %
Lymphocytes Relative: 19 %
Lymphs Abs: 1.6 10*3/uL (ref 0.7–4.0)
MCH: 26.3 pg (ref 26.0–34.0)
MCHC: 31.6 g/dL (ref 30.0–36.0)
MCV: 83.2 fL (ref 80.0–100.0)
Monocytes Absolute: 0.6 10*3/uL (ref 0.1–1.0)
Monocytes Relative: 6 %
Neutro Abs: 6.3 10*3/uL (ref 1.7–7.7)
Neutrophils Relative %: 71 %
Platelets: 198 10*3/uL (ref 150–400)
RBC: 5.71 MIL/uL (ref 4.22–5.81)
RDW: 16.2 % — ABNORMAL HIGH (ref 11.5–15.5)
WBC: 8.7 10*3/uL (ref 4.0–10.5)
nRBC: 0 % (ref 0.0–0.2)

## 2019-02-24 LAB — BASIC METABOLIC PANEL
Anion gap: 11 (ref 5–15)
BUN: 19 mg/dL (ref 8–23)
CO2: 29 mmol/L (ref 22–32)
Calcium: 9.1 mg/dL (ref 8.9–10.3)
Chloride: 101 mmol/L (ref 98–111)
Creatinine, Ser: 1.19 mg/dL (ref 0.61–1.24)
GFR calc Af Amer: >60 mL/min (ref >60)
GFR calc non Af Amer: 59 mL/min — ABNORMAL LOW (ref >60)
Glucose, Bld: 113 mg/dL — ABNORMAL HIGH (ref 70–99)
Potassium: 3.2 mmol/L — ABNORMAL LOW (ref 3.5–5.1)
Sodium: 141 mmol/L (ref 135–145)

## 2019-02-24 LAB — URINALYSIS, ROUTINE W REFLEX MICROSCOPIC
Bacteria, UA: NONE SEEN
Bilirubin Urine: NEGATIVE
Glucose, UA: NEGATIVE mg/dL
Ketones, ur: NEGATIVE mg/dL
Nitrite: POSITIVE — AB
Protein, ur: 100 mg/dL — AB
RBC / HPF: >50 RBC/hpf — ABNORMAL HIGH (ref 0–5)
Specific Gravity, Urine: 1.01 (ref 1.005–1.030)
pH: 7 (ref 5.0–8.0)

## 2019-02-24 MED ORDER — SODIUM CHLORIDE 0.9 % IV SOLN
1.0000 g | Freq: Once | INTRAVENOUS | Status: AC
  Administered 2019-02-24: 1 g via INTRAVENOUS
  Filled 2019-02-24: qty 10

## 2019-02-24 MED ORDER — ACETAMINOPHEN 500 MG PO TABS
1000.0000 mg | ORAL_TABLET | Freq: Once | ORAL | Status: AC
  Administered 2019-02-24: 1000 mg via ORAL
  Filled 2019-02-24: qty 2

## 2019-02-24 MED ORDER — POTASSIUM CHLORIDE CRYS ER 20 MEQ PO TBCR
40.0000 meq | EXTENDED_RELEASE_TABLET | Freq: Once | ORAL | Status: AC
  Administered 2019-02-24: 04:00:00 40 meq via ORAL
  Filled 2019-02-24: qty 2

## 2019-02-24 MED ORDER — CEPHALEXIN 500 MG PO CAPS: 500.0000 mg | ORAL_CAPSULE | Freq: Two times a day (BID) | ORAL | 0 refills | Status: AC

## 2019-02-24 NOTE — ED Notes (Signed)
Pt ambulated to restroom w/o assistance, urine collected.

## 2019-02-24 NOTE — ED Triage Notes (Addendum)
Pt reports frequent, painful urination. Saw urology on Thursday and started taking AZO without relief. Denies hematuria, fever, or vomiting Pt is also hypertensive. He states that his BP is not normally as high as it is tonight. Eyes are also red.

## 2019-02-24 NOTE — Discharge Instructions (Signed)
You were given a prescription for antibiotics. Please take the antibiotic prescription fully.   Please follow up with your urologist within 5-7 days for re-evaluation of your symptoms.  You will need to return to the emergency department for any fevers, persistent pain, persistent vomiting, inability to urinate, or any new or worsening symptoms.

## 2019-02-24 NOTE — ED Provider Notes (Signed)
[facility] DEPT Provider Note   CSN: [id number] Arrival date & time: 02/24/19  0139     History   Chief Complaint Chief Complaint  Patient presents with  . Dysuria    HPI Aaron Moss is a 73 y.o. male.     HPI   Patient is a 73 year old male with a history of colon cancer, diverticulosis, prostate cancer s/p radiation therapy, hyperlipidemia, hypertension, who presents emergency department today for evaluation of dysuria.  Patient states that he has had dysuria for the last 5 days.  He also reports urinary frequency, urgency and hematuria. He does follow with [facility] and he called the office last week and they recommended using Azo which she has been taking without resolution of symptoms.  He denies any abdominal pain, flank pain, nausea, vomiting, diarrhea or fevers.  Reviewed records.  Patient recently seen in the ED for UTI on 02/04/2019.  He was started on Macrobid at that time.  Urine culture showed sensitivity to this antibiotic.  Blood pressure noted to be high on arrival to the ED.  He does report compliance with his blood pressure medications and usually takes them in the morning.  He denies any chest pain, shortness of breath, headaches or other symptoms related to this.  Past Medical History:  Diagnosis Date  . Colon cancer ([place]) 2000  . Diverticulosis of colon   . History of chemotherapy 2000   colon cancer  . History of colon cancer   . History of radiation therapy 08/06/13- 10/03/13   prostate 7800 cGy 40 sessions, seminal vesicles 5600 cGy 40 sessions  . Hyperlipidemia   . Hypertension   . Left knee DJD   . Prostate cancer ([place]) 05/23/13   gleason 3+4=7, volume 11.5 ml    Patient Active Problem List   Diagnosis Date Noted  . Skin lesion 12/24/2018  . Cough 05/03/2018  . Hyperglycemia 05/03/2018  . Left ankle sprain 11/01/2017  . Left knee pain 12/05/2016  . Allergic rhinitis 07/29/2015  . CAP (community  acquired pneumonia) 04/01/2015  . Abnormal urine findings 04/01/2015  . UTI (urinary tract infection) 08/21/2013  . Malignant neoplasm of prostate ([place]) 07/02/2013  . Increased prostate specific antigen (PSA) velocity 03/05/2013  . Bilateral knee pain 08/29/2011  . Bladder neck obstruction 08/29/2011  . Erectile dysfunction 06/20/2011  . Preventative health care 12/14/2010  . 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 HIP PAIN, RIGHT 07/06/2008  . BACK PAIN 07/06/2008  . Hyperlipidemia 05/15/2007  . Essential hypertension 10/12/2006  . History of malignant neoplasm of large intestine 10/12/2006    Past Surgical History:  Procedure Laterality Date  . COLON SURGERY  2000   colon cancer  . COLONOSCOPY    . EUS     pancreatic cyst  . KNEE ARTHROSCOPY  2007   LT- [facility]  . PROSTATE BIOPSY  05/23/13   gleason 7, volume 11.5 ml        Home Medications    Prior to Admission medications   Medication Sig Start Date End Date Taking? Authorizing Provider  amLODipine (NORVASC) 10 MG tablet Take 1 tablet by mouth once daily 06/10/18   Biagio Borg, MD  aspirin EC 81 MG tablet Take 81 mg by mouth daily.      [provider]  atorvastatin (LIPITOR) 40 MG tablet Take 1 tablet by mouth once daily 05/29/18   Biagio Borg,  MD  benazepril (LOTENSIN) 40 MG tablet Take 1 tablet by mouth once daily 11/08/18   Biagio Borg, MD  cephALEXin (KEFLEX) 500 MG capsule Take 1 capsule (500 mg total) by mouth 2 (two) times daily for 14 days. 02/24/19 03/10/19  Kyana Aicher S, PA-C  diclofenac (VOLTAREN) 75 MG EC tablet Take 1 tablet by mouth twice daily as needed for pain 01/13/19   Biagio Borg, MD  hydrochlorothiazide (HYDRODIURIL) 25 MG tablet Take 1 tablet by mouth once daily 05/29/18   Biagio Borg, MD  nitrofurantoin, macrocrystal-monohydrate, (MACROBID) 100 MG capsule Take 1 capsule (100 mg total) by mouth 2 (two)  times daily. 02/04/19   Ward, Delice Bison, DO  potassium chloride (K-DUR) 10 MEQ tablet Take 3 tablets by mouth once daily 11/18/18   Biagio Borg, MD  traMADol (ULTRAM) 50 MG tablet TAKE 1 TABLET BY MOUTH EVERY 6 HOURS AS NEEDED FOR PAIN 02/18/19   Biagio Borg, MD  Travoprost, BAK Free, (TRAVATAN) 0.004 % SOLN ophthalmic solution Place 1 drop into both eyes at bedtime.    [provider]    Family History Family History  Problem Relation Age of Onset  . Cancer Mother        Liver  . Cancer Brother        Lung  . Diabetes Brother   . Colon cancer Sister   . Cancer Maternal Grandmother        Breast  . Esophageal cancer Neg Hx   . Rectal cancer Neg Hx   . Stomach cancer Neg Hx     Social History Social History   Tobacco Use  . Smoking status: Former Smoker    Packs/day: 1.00    Years: 22.00    Pack years: 22.00    Types: Cigarettes    Quit date: 03/20/1988    Years since quitting: 30.9  . Smokeless tobacco: Never Used  Substance Use Topics  . Alcohol use: Yes    Alcohol/week: 0.0 standard drinks    Comment: rare  . Drug use: No     Allergies   Pregabalin   Review of Systems Review of Systems  Constitutional: Negative for fever.  HENT: Negative for ear pain and sore throat.   Eyes: Negative for visual disturbance.  Respiratory: Negative for cough and shortness of breath.   Cardiovascular: Negative for chest pain.  Gastrointestinal: Negative for abdominal pain, constipation, diarrhea, nausea and vomiting.  Genitourinary: Positive for dysuria, frequency, hematuria and urgency. Negative for flank pain.  Musculoskeletal: Negative for back pain.  Skin: Negative for rash.  Neurological: Negative for dizziness, weakness, light-headedness, numbness and headaches.  All other systems reviewed and are negative.    Physical Exam Updated Vital Signs BP (!) 179/91 (BP Location: Left Arm)   Pulse 90   Temp 99.7 F (37.6 C) (Rectal)   Resp 18   Ht 5\' 6"  (1.676  m)   Wt 116.6 kg   SpO2 100%   BMI 41.48 kg/m   Physical Exam Vitals signs and nursing note reviewed.  Constitutional:      Appearance: He is well-developed.  HENT:     Head: Normocephalic and atraumatic.  Eyes:     Conjunctiva/sclera: Conjunctivae normal.  Neck:     Musculoskeletal: Neck supple.  Cardiovascular:     Rate and Rhythm: Normal rate and regular rhythm.     Heart sounds: No murmur.  Pulmonary:     Effort: Pulmonary effort is normal. No respiratory distress.  Breath sounds: Normal breath sounds. No wheezing, rhonchi or rales.  Abdominal:     General: Bowel sounds are normal.     Palpations: Abdomen is soft.     Tenderness: There is no abdominal tenderness. There is no right CVA tenderness, left CVA tenderness, guarding or rebound.  Skin:    General: Skin is warm and dry.  Neurological:     Mental Status: He is alert.    ED Treatments / Results  Labs (all labs ordered are listed, but only abnormal results are displayed) Labs Reviewed  CBC WITH DIFFERENTIAL/PLATELET - Abnormal; Notable for the following components:      Result Value   RDW 16.2 (*)    All other components within normal limits  BASIC METABOLIC PANEL - Abnormal; Notable for the following components:   Potassium 3.2 (*)    Glucose, Bld 113 (*)    GFR calc non Af Amer 59 (*)    All other components within normal limits  URINALYSIS, ROUTINE W REFLEX MICROSCOPIC - Abnormal; Notable for the following components:   Color, Urine ORANGE (*)    APPearance HAZY (*)    Hgb urine dipstick LARGE (*)    Protein, ur 100 (*)    Nitrite POSITIVE (*)    Leukocytes,Ua SMALL (*)    RBC / HPF >50 (*)    All other components within normal limits  URINE CULTURE    EKG None  Radiology No results found.  Procedures Procedures (including critical care time)  Medications Ordered in ED Medications  cefTRIAXone (ROCEPHIN) 1 g in sodium chloride 0.9 % 100 mL IVPB (has no administration in time range)   potassium chloride SA (KLOR-CON) CR tablet 40 mEq (has no administration in time range)  acetaminophen (TYLENOL) tablet 1,000 mg (1,000 mg Oral Given 02/24/19 0250)     Initial Impression / Assessment and Plan / ED Course  I have reviewed the triage vital signs and the nursing notes.  Pertinent labs & imaging results that were available during my care of the patient were reviewed by me and considered in my medical decision making (see chart for details).     Final Clinical Impressions(s) / ED Diagnoses   Final diagnoses:  Acute cystitis with hematuria   73 year old male with history of of UTI presenting for evaluation of dysuria, frequency, urgency and hematuria for the last 4 to 5 days.  No fevers, flank pain, nausea vomiting or other systemic symptoms.  CBC w/o leukocytosis or anemia BMP with mild hypokalemia, otherwise reassuring  - k supplemented Po UA with hematuria, nitrities, leukocytes, >50 rbc, 11-20 wbc. Suggestive of UTI. Culture sent.  - dose of ceftriaxone given in ED, pt tolerating po  Pt afebrile. Nontoxic, nonseptic appearing. No flank pain or fevers to suggest pyelonephritis. rx for keflex given. Advised on urology f/u and return precautions. He voices understanding and is in agreement with plan. All questions answered, pt stable for d/c.   Pt seen in conjunction with Dr. Roxanne Mins who personally evaluated this patient. He recommends extended course of keflex.   ED Discharge Orders         Ordered    cephALEXin (KEFLEX) 500 MG capsule  2 times daily     02/24/19 0413           Rodney Booze, PA-C [id number]    Delora Fuel, MD [id number] [phone]

## 2019-02-26 LAB — URINE CULTURE: Culture: 30000 — AB

## 2019-02-27 ENCOUNTER — Telehealth: Payer: Self-pay

## 2019-02-27 NOTE — Telephone Encounter (Signed)
Post ED Visit - Positive Culture Follow-up  Culture report reviewed by antimicrobial stewardship pharmacist: [place] Team []  Elenor Quinones, Pharm.D. []  Heide Guile, Pharm.D., BCPS AQ-ID []  Parks Neptune, Pharm.D., BCPS []  Alycia Rossetti, Pharm.D., BCPS []  [place], Pharm.D., BCPS, AAHIVP []  Legrand Como, Pharm.D., BCPS, AAHIVP []  Salome Arnt, PharmD, BCPS []  Johnnette Gourd, PharmD, BCPS []  Hughes Better, PharmD, BCPS []  Leeroy Cha, PharmD []  Laqueta Linden, PharmD, BCPS []  Albertina Parr, PharmD  [place] Team []  Leodis Sias, PharmD []  Lindell Spar, PharmD []  Royetta Asal, PharmD []  Graylin Shiver, Rph []  Rema Fendt) Glennon Mac, PharmD []  Arlyn Dunning, PharmD []  Netta Cedars, PharmD []  Dia Sitter, PharmD []  Leone Haven, PharmD []  Gretta Arab, PharmD []  Theodis Shove, PharmD []  Peggyann Juba, PharmD [x]  Reuel Boom, PharmD   Positive urine culture Treated with Cephlalexin, organism sensitive to the same and no further patient follow-up is required at this time.  Genia Del 02/27/2019, 1:42 PM

## 2019-02-27 NOTE — Telephone Encounter (Signed)
Copied from [place] [phone]. Topic: General - Other >> Feb 27, 2019  3:28 PM Wynetta Emery, [place] C wrote: Reason for CRM: pt called in to be advised. Pt has an apt tomorrow for BP check, pt says that his granddaughter's friend tested positive for covid, so pt has been exposed. please advise pt

## 2019-02-27 NOTE — Telephone Encounter (Signed)
Pt's appt has changed to a phone visit. He has also been informed to go to [place] for testing tomorrow. Business hours have been given.

## 2019-02-28 ENCOUNTER — Ambulatory Visit (INDEPENDENT_AMBULATORY_CARE_PROVIDER_SITE_OTHER): Payer: Medicare Other | Admitting: Internal Medicine

## 2019-02-28 ENCOUNTER — Encounter: Payer: Self-pay | Admitting: Internal Medicine

## 2019-02-28 DIAGNOSIS — I10 Essential (primary) hypertension: Secondary | ICD-10-CM

## 2019-02-28 MED ORDER — METOPROLOL SUCCINATE ER 100 MG PO TB24: 100.0000 mg | ORAL_TABLET | Freq: Every day | ORAL | 3 refills | Status: DC

## 2019-02-28 NOTE — Assessment & Plan Note (Signed)
See notes

## 2019-02-28 NOTE — Patient Instructions (Signed)
Please take all new medication as prescribed  Please continue all other medications as before, and refills have been done if requested.  Please have the pharmacy call with any other refills you may need.  Please continue your efforts at being more active, low cholesterol diet, and weight control.  Please keep your appointments with your specialists as you may have planned    

## 2019-02-28 NOTE — Progress Notes (Signed)
Patient ID: Aaron Moss, male   DOB: [birth year], 73 y.o.   MRN: [id number]  Phone visit  Cumulative time during 7-day interval {12 min, there was not an associated office visit for this concern within a 7 day period.  Verbal consent for services obtained from patient prior to services given.  Names of all persons present for services: Cathlean Cower, MD, patient  Chief complaint: HTN  History, background, results pertinent:  Pt here with concern about elevated BP that seems to be relatively suddenly worsening since at least nov 16; has been seen at ED twice for UTI symptoms, then once per urology, and BP at home throughout has been similar to toady at 160/90s.  States good compliance with meds. Pt denies chest pain, increased sob or doe, wheezing, orthopnea, PND, increased LE swelling, palpitations, dizziness or syncope.  Pt denies new neurological symptoms such as new headache, or facial or extremity weakness or numbness   Pt denies polydipsia, polyuria.  Denies urinary symptoms such as dysuria, frequency, urgency, flank pain, hematuria or n/v, fever, chills. BP this am 161/90 at home.  Pt states HR at home has been most often in 80s when he check BPs. BP Readings from Last 3 Encounters:  02/24/19 (!) 179/91  02/04/19 (!) 170/80  12/24/18 (!) 142/86   Past Medical History:  Diagnosis Date  . Colon cancer ([place]) 2000  . Diverticulosis of colon   . History of chemotherapy 2000   colon cancer  . History of colon cancer   . History of radiation therapy 08/06/13- 10/03/13   prostate 7800 cGy 40 sessions, seminal vesicles 5600 cGy 40 sessions  . Hyperlipidemia   . Hypertension   . Left knee DJD   . Prostate cancer ([place]) 05/23/13   gleason 3+4=7, volume 11.5 ml   No results found for this or any previous visit (from the past 48 hour(s)).  Lab Results  Component Value Date   WBC 8.7 02/24/2019   HGB 15.0 02/24/2019   HCT 47.5 02/24/2019   PLT 198 02/24/2019   GLUCOSE 113 (H) 02/24/2019   CHOL 139 05/03/2018   TRIG 83.0 05/03/2018   HDL 34.50 (L) 05/03/2018   LDLDIRECT 185.6 05/15/2007   LDLCALC 88 05/03/2018   ALT 13 05/03/2018   AST 14 05/03/2018   NA 141 02/24/2019   K 3.2 (L) 02/24/2019   CL 101 02/24/2019   CREATININE 1.19 02/24/2019   BUN 19 02/24/2019   CO2 29 02/24/2019   TSH 0.84 05/03/2018   PSA 0.05 (L) 05/03/2018   HGBA1C 6.4 05/03/2018   A/P/next steps:   1)  HTN - elevated BP persistent coincident it seems with acute illness, likely improving, but BP remains elevated for now; good compliance with current meds including amlodipine, HCT and lotensin; will add metoprolol xl 100 qd, but warned of possible need to reduce this in future if he become less hyperdynamic in a few wks after resolution of his UTI. Pt to check BP tid for 1 wk and let us know BP by Earmon Phoenix MD

## 2019-03-15 ENCOUNTER — Other Ambulatory Visit: Payer: Self-pay | Admitting: Internal Medicine

## 2019-03-16 NOTE — Telephone Encounter (Signed)
Per routine office policy  All routine meds ok for 1 yr, then month to month only for 3 mo until refused after, thanks 

## 2019-04-10 ENCOUNTER — Ambulatory Visit: Payer: Medicare Other | Attending: Internal Medicine

## 2019-04-10 DIAGNOSIS — Z23 Encounter for immunization: Secondary | ICD-10-CM | POA: Insufficient documentation

## 2019-04-10 NOTE — Progress Notes (Signed)
   Covid-19 Vaccination Clinic  Name:  Aaron Moss    MRN: [id number] DOB: 07-20-1945  04/10/2019  [name] was observed post Covid-19 immunization for 30 minutes based on pre-vaccination screening without incidence. He was provided with Vaccine Information Sheet and instruction to access the V-Safe system.   [name] was instructed to call 911 with any severe reactions post vaccine: Marland Kitchen Difficulty breathing  . Swelling of your face and throat  . A fast heartbeat  . A bad rash all over your body  . Dizziness and weakness    Immunizations Administered    Name Date Dose VIS Date Route   Pfizer COVID-19 Vaccine 04/10/2019  9:19 AM 0.3 mL 02/28/2019 Intramuscular   Manufacturer: [place]   Lot: [id number]   [place]: [id number]

## 2019-04-21 ENCOUNTER — Other Ambulatory Visit: Payer: Self-pay | Admitting: Internal Medicine

## 2019-04-21 NOTE — Telephone Encounter (Signed)
Done erx 

## 2019-04-28 ENCOUNTER — Ambulatory Visit: Payer: Medicare Other | Attending: Internal Medicine

## 2019-04-28 DIAGNOSIS — Z23 Encounter for immunization: Secondary | ICD-10-CM | POA: Insufficient documentation

## 2019-04-28 NOTE — Progress Notes (Signed)
   Covid-19 Vaccination [facility]  Name:  Aaron Moss    MRN: [id number] DOB: May 05, 1945  04/28/2019  [name] was observed post Covid-19 immunization for 15 minutes without incidence. He was provided with Vaccine Information Sheet and instruction to access the V-Safe system.   [name] was instructed to call 911 with any severe reactions post vaccine: Marland Kitchen Difficulty breathing  . Swelling of your face and throat  . A fast heartbeat  . A bad rash all over your body  . Dizziness and weakness    Immunizations Administered    Name Date Dose VIS Date Route   Pfizer COVID-19 Vaccine 04/28/2019  8:36 AM 0.3 mL 02/28/2019 Intramuscular   Manufacturer: [place]   Lot: [id number]   [place]: [id number]

## 2019-05-06 ENCOUNTER — Ambulatory Visit (INDEPENDENT_AMBULATORY_CARE_PROVIDER_SITE_OTHER): Payer: Medicare Other | Admitting: Internal Medicine

## 2019-05-06 ENCOUNTER — Ambulatory Visit: Payer: Medicare Other

## 2019-05-06 ENCOUNTER — Other Ambulatory Visit: Payer: Self-pay

## 2019-05-06 ENCOUNTER — Encounter: Payer: Self-pay | Admitting: Internal Medicine

## 2019-05-06 VITALS — BP 150/70 | Temp 98.4°F | Ht 66.0 in | Wt 268.8 lb

## 2019-05-06 DIAGNOSIS — E611 Iron deficiency: Secondary | ICD-10-CM | POA: Diagnosis not present

## 2019-05-06 DIAGNOSIS — R739 Hyperglycemia, unspecified: Secondary | ICD-10-CM

## 2019-05-06 DIAGNOSIS — Z0000 Encounter for general adult medical examination without abnormal findings: Secondary | ICD-10-CM

## 2019-05-06 DIAGNOSIS — E559 Vitamin D deficiency, unspecified: Secondary | ICD-10-CM

## 2019-05-06 DIAGNOSIS — E538 Deficiency of other specified B group vitamins: Secondary | ICD-10-CM | POA: Diagnosis not present

## 2019-05-06 DIAGNOSIS — I10 Essential (primary) hypertension: Secondary | ICD-10-CM | POA: Diagnosis not present

## 2019-05-06 LAB — BASIC METABOLIC PANEL
BUN: 14 mg/dL (ref 6–23)
CO2: 32 mEq/L (ref 19–32)
Calcium: 9.5 mg/dL (ref 8.4–10.5)
Chloride: 105 mEq/L (ref 96–112)
Creatinine, Ser: 1.12 mg/dL (ref 0.40–1.50)
GFR: 77.25 mL/min (ref >60.00)
Glucose, Bld: 96 mg/dL (ref 70–99)
Potassium: 3.2 mEq/L — ABNORMAL LOW (ref 3.5–5.1)
Sodium: 143 mEq/L (ref 135–145)

## 2019-05-06 LAB — URINALYSIS, ROUTINE W REFLEX MICROSCOPIC
Bilirubin Urine: NEGATIVE
Hgb urine dipstick: NEGATIVE
Ketones, ur: NEGATIVE
Leukocytes,Ua: NEGATIVE
Nitrite: NEGATIVE
RBC / HPF: NONE SEEN (ref 0–?)
Specific Gravity, Urine: 1.015 (ref 1.000–1.030)
Total Protein, Urine: NEGATIVE
Urine Glucose: NEGATIVE
Urobilinogen, UA: 0.2 (ref 0.0–1.0)
pH: 6.5 (ref 5.0–8.0)

## 2019-05-06 LAB — HEPATIC FUNCTION PANEL
ALT: 13 U/L (ref 0–53)
AST: 14 U/L (ref 0–37)
Albumin: 4 g/dL (ref 3.5–5.2)
Alkaline Phosphatase: 119 U/L — ABNORMAL HIGH (ref 39–117)
Bilirubin, Direct: 0.2 mg/dL (ref 0.0–0.3)
Total Bilirubin: 1.2 mg/dL (ref 0.2–1.2)
Total Protein: 7.2 g/dL (ref 6.0–8.3)

## 2019-05-06 LAB — CBC WITH DIFFERENTIAL/PLATELET
Basophils Absolute: 0 10*3/uL (ref 0.0–0.1)
Basophils Relative: 0.5 % (ref 0.0–3.0)
Eosinophils Absolute: 0.3 10*3/uL (ref 0.0–0.7)
Eosinophils Relative: 3 % (ref 0.0–5.0)
HCT: 45.3 % (ref 39.0–52.0)
Hemoglobin: 14.9 g/dL (ref 13.0–17.0)
Lymphocytes Relative: 21.2 % (ref 12.0–46.0)
Lymphs Abs: 1.9 10*3/uL (ref 0.7–4.0)
MCHC: 32.9 g/dL (ref 30.0–36.0)
MCV: 81 fl (ref 78.0–100.0)
Monocytes Absolute: 0.5 10*3/uL (ref 0.1–1.0)
Monocytes Relative: 5.5 % (ref 3.0–12.0)
Neutro Abs: 6.3 10*3/uL (ref 1.4–7.7)
Neutrophils Relative %: 69.8 % (ref 43.0–77.0)
Platelets: 215 10*3/uL (ref 150.0–400.0)
RBC: 5.59 Mil/uL (ref 4.22–5.81)
RDW: 16.6 % — ABNORMAL HIGH (ref 11.5–15.5)
WBC: 9.1 10*3/uL (ref 4.0–10.5)

## 2019-05-06 LAB — IBC PANEL
Iron: 47 ug/dL (ref 42–165)
Saturation Ratios: 19.1 % — ABNORMAL LOW (ref 20.0–50.0)
Transferrin: 176 mg/dL — ABNORMAL LOW (ref 212.0–360.0)

## 2019-05-06 LAB — LIPID PANEL
Cholesterol: 157 mg/dL (ref 0–200)
HDL: 36.7 mg/dL — ABNORMAL LOW (ref >39.00)
LDL Cholesterol: 96 mg/dL (ref 0–99)
NonHDL: 120.19
Total CHOL/HDL Ratio: 4
Triglycerides: 119 mg/dL (ref 0.0–149.0)
VLDL: 23.8 mg/dL (ref 0.0–40.0)

## 2019-05-06 LAB — HEMOGLOBIN A1C: Hgb A1c MFr Bld: 6.8 % — ABNORMAL HIGH (ref 4.6–6.5)

## 2019-05-06 LAB — VITAMIN B12: Vitamin B-12: 443 pg/mL (ref 211–911)

## 2019-05-06 LAB — VITAMIN D 25 HYDROXY (VIT D DEFICIENCY, FRACTURES): VITD: 12.12 ng/mL — ABNORMAL LOW (ref 30.00–100.00)

## 2019-05-06 LAB — TSH: TSH: 1.28 u[IU]/mL (ref 0.35–4.50)

## 2019-05-06 LAB — PSA: PSA: 0.06 ng/mL — ABNORMAL LOW (ref 0.10–4.00)

## 2019-05-06 NOTE — Assessment & Plan Note (Signed)
stable overall by history and exam, recent data reviewed with pt, and pt to continue medical treatment as before,  to f/u any worsening symptoms or concerns  

## 2019-05-06 NOTE — Patient Instructions (Signed)

## 2019-05-06 NOTE — Assessment & Plan Note (Signed)

## 2019-05-06 NOTE — Progress Notes (Signed)
Subjective:    Patient ID: Aaron Moss, male    DOB: [birth year], 74 y.o.   MRN: [id number]  HPI  Here for wellness and f/u;  Overall doing ok;  Pt denies Chest pain, worsening SOB, DOE, wheezing, orthopnea, PND, worsening LE edema, palpitations, dizziness or syncope.  Pt denies neurological change such as new headache, facial or extremity weakness.  Pt denies polydipsia, polyuria, or low sugar symptoms. Pt states overall good compliance with treatment and medications, good tolerability, and has been trying to follow appropriate diet.  Pt denies worsening depressive symptoms, suicidal ideation or panic. No fever, night sweats, wt loss, loss of appetite, or other constitutional symptoms.  Pt states good ability with ADL's, has low fall risk, home safety reviewed and adequate, no other significant changes in hearing or vision, and only occasionally active with exercise. Wt Readings from Last 3 Encounters:  05/06/19 268 lb 12.8 oz (121.9 kg)  02/24/19 257 lb (116.6 kg)  02/04/19 260 lb (117.9 kg)  S/p covid shot x 2.  BP at home < 140/90, needs new machine, and state BP in left arm slightly higher than right.  Past Medical History:  Diagnosis Date  . Colon cancer ([place]) 2000  . Diverticulosis of colon   . History of chemotherapy 2000   colon cancer  . History of colon cancer   . History of radiation therapy 08/06/13- 10/03/13   prostate 7800 cGy 40 sessions, seminal vesicles 5600 cGy 40 sessions  . Hyperlipidemia   . Hypertension   . Left knee DJD   . Prostate cancer ([place]) 05/23/13   gleason 3+4=7, volume 11.5 ml   Past Surgical History:  Procedure Laterality Date  . COLON SURGERY  2000   colon cancer  . COLONOSCOPY    . EUS     pancreatic cyst  . KNEE ARTHROSCOPY  2007   LT- [facility]  . PROSTATE BIOPSY  05/23/13   gleason 7, volume 11.5 ml    reports that he quit smoking about 31 years ago. His smoking use included cigarettes. He has a 22.00 pack-year smoking history. He has never  used smokeless tobacco. He reports current alcohol use. He reports that he does not use drugs. family history includes Cancer in his brother, maternal grandmother, and mother; Colon cancer in his sister; Diabetes in his brother. Allergies  Allergen Reactions  . Pregabalin     REACTION: felt bad   Current Outpatient Medications on File Prior to Visit  Medication Sig Dispense Refill  . amLODipine (NORVASC) 10 MG tablet Take 1 tablet by mouth once daily 90 tablet 3  . aspirin EC 81 MG tablet Take 81 mg by mouth daily.      Marland Kitchen atorvastatin (LIPITOR) 40 MG tablet Take 1 tablet by mouth once daily 90 tablet 3  . benazepril (LOTENSIN) 40 MG tablet Take 1 tablet by mouth once daily 90 tablet 1  . diclofenac (VOLTAREN) 75 MG EC tablet Take 1 tablet (75 mg total) by mouth 2 (two) times daily as needed. Annual appt due in February must see provider for future refills 60 tablet 0  . hydrochlorothiazide (HYDRODIURIL) 25 MG tablet Take 1 tablet by mouth once daily 90 tablet 3  . metoprolol succinate (TOPROL-XL) 100 MG 24 hr tablet Take 1 tablet (100 mg total) by mouth daily. Take with or immediately following a meal. 90 tablet 3  . potassium chloride (K-DUR) 10 MEQ tablet Take 3 tablets by mouth once daily 270 tablet 1  .  traMADol (ULTRAM) 50 MG tablet TAKE 1 TABLET BY MOUTH EVERY 6 HOURS AS NEEDED FOR PAIN 120 tablet 2  . Travoprost, BAK Free, (TRAVATAN) 0.004 % SOLN ophthalmic solution Place 1 drop into both eyes at bedtime.    . nitrofurantoin, macrocrystal-monohydrate, (MACROBID) 100 MG capsule Take 1 capsule (100 mg total) by mouth 2 (two) times daily. (Patient not taking: Reported on 05/06/2019) 14 capsule 0   No current facility-administered medications on file prior to visit.   Review of Systems All otherwise neg per pt    Objective:   Physical Exam BP (!) 150/70 (BP Location: Left Arm, Patient Position: Sitting, Cuff Size: Large)   Temp 98.4 F (36.9 C) (Oral)   Ht 5\' 6"  (1.676 m)   Wt 268  lb 12.8 oz (121.9 kg)   BMI 43.39 kg/m  VS noted,  Constitutional: Pt appears in NAD HENT: Head: NCAT.  Right Ear: External ear normal.  Left Ear: External ear normal.  Eyes: . Pupils are equal, round, and reactive to light. Conjunctivae and EOM are normal Nose: without d/c or deformity Neck: Neck supple. Gross normal ROM Cardiovascular: Normal rate and regular rhythm.   Pulmonary/Chest: Effort normal and breath sounds without rales or wheezing.  Abd:  Soft, NT, ND, + BS, no organomegaly Neurological: Pt is alert. At baseline orientation, motor grossly intact Skin: Skin is warm. No rashes, other new lesions, no LE edema Psychiatric: Pt behavior is normal without agitation  All otherwise neg per pt Lab Results  Component Value Date   WBC 9.1 05/06/2019   HGB 14.9 05/06/2019   HCT 45.3 05/06/2019   PLT 215.0 05/06/2019   GLUCOSE 96 05/06/2019   CHOL 157 05/06/2019   TRIG 119.0 05/06/2019   HDL 36.70 (L) 05/06/2019   LDLDIRECT 185.6 05/15/2007   LDLCALC 96 05/06/2019   ALT 13 05/06/2019   AST 14 05/06/2019   NA 143 05/06/2019   K 3.2 (L) 05/06/2019   CL 105 05/06/2019   CREATININE 1.12 05/06/2019   BUN 14 05/06/2019   CO2 32 05/06/2019   TSH 1.28 05/06/2019   PSA 0.06 (L) 05/06/2019   HGBA1C 6.8 (H) 05/06/2019      Assessment & Plan:

## 2019-05-08 ENCOUNTER — Other Ambulatory Visit: Payer: Self-pay | Admitting: Internal Medicine

## 2019-05-08 MED ORDER — VITAMIN D (ERGOCALCIFEROL) 1.25 MG (50000 UNIT) PO CAPS: 50000.0000 [IU] | ORAL_CAPSULE | ORAL | 0 refills | Status: DC

## 2019-05-09 ENCOUNTER — Other Ambulatory Visit: Payer: Self-pay | Admitting: Internal Medicine

## 2019-05-09 NOTE — Telephone Encounter (Signed)
Please refill as per office routine med refill policy (all routine meds refilled for 3 mo or monthly per pt preference up to one year from last visit, then month to month grace period for 3 mo, then further med refills will have to be denied)  

## 2019-05-17 ENCOUNTER — Other Ambulatory Visit: Payer: Self-pay | Admitting: Internal Medicine

## 2019-05-17 NOTE — Telephone Encounter (Signed)
Please refill as per office routine med refill policy (all routine meds refilled for 3 mo or monthly per pt preference up to one year from last visit, then month to month grace period for 3 mo, then further med refills will have to be denied)  

## 2019-06-02 ENCOUNTER — Other Ambulatory Visit: Payer: Self-pay | Admitting: Internal Medicine

## 2019-06-06 ENCOUNTER — Other Ambulatory Visit: Payer: Self-pay | Admitting: Internal Medicine

## 2019-06-06 NOTE — Telephone Encounter (Signed)
Please refill as per office routine med refill policy (all routine meds refilled for 3 mo or monthly per pt preference up to one year from last visit, then month to month grace period for 3 mo, then further med refills will have to be denied)  

## 2019-06-09 ENCOUNTER — Ambulatory Visit: Payer: Medicare Other

## 2019-07-02 ENCOUNTER — Ambulatory Visit: Payer: Medicare Other | Admitting: Internal Medicine

## 2019-07-02 ENCOUNTER — Encounter: Payer: Self-pay | Admitting: Internal Medicine

## 2019-07-02 ENCOUNTER — Ambulatory Visit (INDEPENDENT_AMBULATORY_CARE_PROVIDER_SITE_OTHER): Payer: Medicare Other

## 2019-07-02 ENCOUNTER — Other Ambulatory Visit: Payer: Self-pay

## 2019-07-02 VITALS — BP 130/80 | HR 84 | Temp 98.2°F | Resp 16 | Ht 66.0 in | Wt 263.2 lb

## 2019-07-02 DIAGNOSIS — M545 Low back pain, unspecified: Secondary | ICD-10-CM

## 2019-07-02 DIAGNOSIS — M5136 Other intervertebral disc degeneration, lumbar region: Secondary | ICD-10-CM | POA: Diagnosis not present

## 2019-07-02 DIAGNOSIS — G8929 Other chronic pain: Secondary | ICD-10-CM | POA: Diagnosis not present

## 2019-07-02 MED ORDER — RELAFEN DS 1000 MG PO TABS: 1.0000 | ORAL_TABLET | Freq: Every day | ORAL | 0 refills | Status: DC | PRN

## 2019-07-02 NOTE — Progress Notes (Signed)
Subjective:  Patient ID: Aaron Moss, male    DOB: 03-13-1946  Age: 74 y.o. MRN: [id number]  CC: Back Pain  This visit occurred during the SARS-CoV-2 public health emergency.  Safety protocols were in place, including screening questions prior to the visit, additional usage of staff PPE, and extensive cleaning of exam room while observing appropriate contact time as indicated for disinfecting solutions.    HPI Aaron Moss presents for concerns about a 64-month history of low back pain.  There was no preceding trauma or injury.  He has a history of prostate cancer.  He describes it as an achy sensation that increases at night when he is in the bed.  The back pain does not radiate and he denies lower extremity paresthesias.  Outpatient Medications Prior to Visit  Medication Sig Dispense Refill  . amLODipine (NORVASC) 10 MG tablet Take 1 tablet by mouth once daily 90 tablet 0  . aspirin EC 81 MG tablet Take 81 mg by mouth daily.      Marland Kitchen atorvastatin (LIPITOR) 40 MG tablet Take 1 tablet by mouth once daily 90 tablet 3  . benazepril (LOTENSIN) 40 MG tablet Take 1 tablet by mouth once daily 90 tablet 0  . hydrochlorothiazide (HYDRODIURIL) 25 MG tablet Take 1 tablet by mouth once daily 90 tablet 1  . metoprolol succinate (TOPROL-XL) 100 MG 24 hr tablet Take 1 tablet (100 mg total) by mouth daily. Take with or immediately following a meal. 90 tablet 3  . potassium chloride (KLOR-CON) 10 MEQ tablet Take 3 tablets by mouth once daily 270 tablet 0  . traMADol (ULTRAM) 50 MG tablet TAKE 1 TABLET BY MOUTH EVERY 6 HOURS AS NEEDED FOR PAIN 120 tablet 2  . Travoprost, BAK Free, (TRAVATAN) 0.004 % SOLN ophthalmic solution Place 1 drop into both eyes at bedtime.    . Vitamin D, Ergocalciferol, (DRISDOL) 1.25 MG (50000 UNIT) CAPS capsule Take 1 capsule (50,000 Units total) by mouth every 7 (seven) days. 12 capsule 0  . diclofenac (VOLTAREN) 75 MG EC tablet TAKE 1 TABLET BY MOUTH TWICE DAILY AS NEEDED .  APPOINTMENT REQUIRED FOR FUTURE REFILLS 60 tablet 0  . nitrofurantoin, macrocrystal-monohydrate, (MACROBID) 100 MG capsule Take 1 capsule (100 mg total) by mouth 2 (two) times daily. 14 capsule 0   No facility-administered medications prior to visit.    ROS Review of Systems  Constitutional: Negative for chills, diaphoresis, fatigue and fever.  HENT: Negative.   Eyes: Negative for visual disturbance.  Respiratory: Negative for cough, chest tightness, shortness of breath and wheezing.   Cardiovascular: Negative for chest pain, palpitations and leg swelling.  Gastrointestinal: Negative for abdominal pain, constipation, diarrhea, nausea and vomiting.  Endocrine: Negative.   Genitourinary: Negative.  Negative for difficulty urinating.  Musculoskeletal: Positive for back pain. Negative for myalgias and neck pain.  Skin: Negative.  Negative for rash.  Neurological: Negative for dizziness, weakness, light-headedness and numbness.  Hematological: Negative for adenopathy. Does not bruise/bleed easily.  Psychiatric/Behavioral: Negative.     Objective:  BP 130/80 (BP Location: Left Arm, Patient Position: Sitting, Cuff Size: Large)   Pulse 84   Temp 98.2 F (36.8 C) (Oral)   Resp 16   Ht 5\' 6"  (1.676 m)   Wt 263 lb 4 oz (119.4 kg)   SpO2 97%   BMI 42.49 kg/m   BP Readings from Last 3 Encounters:  07/02/19 130/80  05/06/19 (!) 150/70  02/24/19 (!) 179/91    Wt Readings from  Last 3 Encounters:  07/02/19 263 lb 4 oz (119.4 kg)  05/06/19 268 lb 12.8 oz (121.9 kg)  02/24/19 257 lb (116.6 kg)    Physical Exam Vitals reviewed.  Constitutional:      Appearance: Normal appearance.  HENT:     Nose: Nose normal.     Mouth/Throat:     Mouth: Mucous membranes are moist.  Eyes:     General: No scleral icterus.    Conjunctiva/sclera: Conjunctivae normal.  Cardiovascular:     Rate and Rhythm: Normal rate and regular rhythm.     Heart sounds: No murmur.  Pulmonary:     Effort:  Pulmonary effort is normal.     Breath sounds: No stridor. No wheezing, rhonchi or rales.  Abdominal:     General: Abdomen is flat. Bowel sounds are normal. There is no distension.     Palpations: Abdomen is soft. There is no hepatomegaly, splenomegaly or mass.     Tenderness: There is no abdominal tenderness.  Musculoskeletal:        General: Normal range of motion.     Cervical back: Neck supple.     Lumbar back: Normal. No swelling, edema, deformity, signs of trauma, spasms, tenderness or bony tenderness. Normal range of motion. Negative right straight leg raise test and negative left straight leg raise test.     Right lower leg: No edema.     Left lower leg: No edema.  Skin:    General: Skin is warm and dry.  Neurological:     General: No focal deficit present.     Mental Status: He is alert and oriented to person, place, and time. Mental status is at baseline.     Motor: Motor function is intact. No weakness.     Coordination: Coordination is intact.     Deep Tendon Reflexes: Reflexes normal.  Psychiatric:        Mood and Affect: Mood normal.        Behavior: Behavior normal.     Lab Results  Component Value Date   WBC 9.1 05/06/2019   HGB 14.9 05/06/2019   HCT 45.3 05/06/2019   PLT 215.0 05/06/2019   GLUCOSE 96 05/06/2019   CHOL 157 05/06/2019   TRIG 119.0 05/06/2019   HDL 36.70 (L) 05/06/2019   LDLDIRECT 185.6 05/15/2007   LDLCALC 96 05/06/2019   ALT 13 05/06/2019   AST 14 05/06/2019   NA 143 05/06/2019   K 3.2 (L) 05/06/2019   CL 105 05/06/2019   CREATININE 1.12 05/06/2019   BUN 14 05/06/2019   CO2 32 05/06/2019   TSH 1.28 05/06/2019   PSA 0.06 (L) 05/06/2019   HGBA1C 6.8 (H) 05/06/2019    No results found.  Assessment & Plan:   Aaron Moss was seen today for back pain.  Diagnoses and all orders for this visit:  Chronic right-sided low back pain without sciatica- He has a 54-month history of low back pain with no radicular symptoms.  Plain films are  positive for DDD.  The examination is reassuring.  I recommended that he treat this with nabumetone. -     DG Lumbar Spine Complete; Future -     Nabumetone (RELAFEN DS) 1000 MG TABS; Take 1 tablet by mouth daily as needed.  DDD (degenerative disc disease), lumbar -     Nabumetone (RELAFEN DS) 1000 MG TABS; Take 1 tablet by mouth daily as needed.   I have discontinued Sylvio Kone. Duey's nitrofurantoin (macrocrystal-monohydrate) and diclofenac. I am also  having him start on Relafen DS. Additionally, I am having him maintain his aspirin EC, Travoprost (BAK Free), atorvastatin, metoprolol succinate, traMADol, Vitamin D (Ergocalciferol), benazepril, potassium chloride, hydrochlorothiazide, and amLODipine.  Meds ordered this encounter  Medications  . Nabumetone (RELAFEN DS) 1000 MG TABS    Sig: Take 1 tablet by mouth daily as needed.    Dispense:  12 tablet    Refill:  0     Follow-up: Return in about 3 months (around 10/01/2019).  Scarlette Calico, MD

## 2019-07-02 NOTE — Patient Instructions (Signed)

## 2019-07-04 ENCOUNTER — Telehealth: Payer: Self-pay | Admitting: Internal Medicine

## 2019-07-04 DIAGNOSIS — M5136 Other intervertebral disc degeneration, lumbar region: Secondary | ICD-10-CM

## 2019-07-04 DIAGNOSIS — G8929 Other chronic pain: Secondary | ICD-10-CM

## 2019-07-04 NOTE — Telephone Encounter (Signed)
° ° °  Patient states med samples of Nabumetone (RELAFEN DS) 1000 MG TABS are working well. Please sent to pharmacy    1.Medication Requested:Nabumetone (RELAFEN DS) 1000 MG TABS  2. Pharmacy (Name, Street, [place]):[facility] ([place]), [place] - [address]  3. On Med List: yes  4. Last Visit with PCP: 07/02/19  5. Next visit date with PCP:   Agent: Please be advised that RX refills may take up to 3 business days. We ask that you follow-up with your pharmacy.

## 2019-07-05 MED ORDER — RELAFEN DS 1000 MG PO TABS: 1.0000 | ORAL_TABLET | Freq: Every day | ORAL | 0 refills | Status: DC | PRN

## 2019-07-11 NOTE — Telephone Encounter (Signed)
Key: [id number]

## 2019-07-17 ENCOUNTER — Telehealth: Payer: Self-pay | Admitting: Internal Medicine

## 2019-07-17 NOTE — Telephone Encounter (Signed)
   Aaron Moss from [facility] calling to report PAD testing results PAD score Left - 0.78 Right- 0.85

## 2019-07-18 ENCOUNTER — Ambulatory Visit (INDEPENDENT_AMBULATORY_CARE_PROVIDER_SITE_OTHER): Payer: Medicare Other

## 2019-07-18 ENCOUNTER — Other Ambulatory Visit: Payer: Self-pay

## 2019-07-18 VITALS — BP 140/70 | HR 65 | Temp 98.4°F | Ht 66.0 in | Wt 264.0 lb

## 2019-07-18 DIAGNOSIS — Z0000 Encounter for general adult medical examination without abnormal findings: Secondary | ICD-10-CM | POA: Diagnosis not present

## 2019-07-18 NOTE — Patient Instructions (Addendum)
[name] , Thank you for taking time to come for your Medicare Wellness Visit. I appreciate your ongoing commitment to your health goals. Please review the following plan we discussed and let me know if I can assist you in the future.   Screening recommendations/referrals: Colorectal Screening: 01/10/2018; repeat in 5 years  Vision and Dental Exams: Recommended annual ophthalmology exams for early detection of glaucoma and other disorders of the eye Recommended annual dental exams for proper oral hygiene  Vaccinations: Influenza vaccine: 12/12/2018 Pneumococcal vaccine: completed; Pneumovax 06/08/2009, Prevnar 03/26/2013 Tdap vaccine: 05/28/2015; Due every 10 years Shingles vaccine: Please call your insurance company to determine your out of pocket expense for the Shingrix vaccine. You may receive this vaccine at your local pharmacy. Covid vaccine: completed; [place] 04/10/2019, 04/28/2019 Zoster: 02/19/2006  Advanced directives: Advance directives discussed with you today. Please bring a copy of your POA (Power of [place]) and/or Living Will to your next appointment.  Goals:  Recommend to drink at least 6-8 8oz glasses of water per day.  Recommend to exercise for at least 150 minutes per week.  Recommend to remove any items from the home that may cause slips or trips.  Recommend to decrease portion sizes by eating 3 small healthy meals and at least 2 healthy snacks per day.  Recommend to begin DASH diet as directed below  Recommend to continue efforts to reduce smoking habits until no longer smoking. Smoking Cessation literature is attached below.  Next appointment: Please schedule your Annual Wellness Visit with your Nurse Health Advisor in one year.  Preventive Care 22 Years and Older, Male Preventive care refers to lifestyle choices and visits with your health care provider that can promote health and wellness. What does preventive care include?  A yearly physical exam. This is  also called an annual well check.  Dental exams once or twice a year.  Routine eye exams. Ask your health care provider how often you should have your eyes checked.  Personal lifestyle choices, including:  Daily care of your teeth and gums.  Regular physical activity.  Eating a healthy diet.  Avoiding tobacco and drug use.  Limiting alcohol use.  Practicing safe sex.  Taking low doses of aspirin every day if recommended by your health care provider..  Taking vitamin and mineral supplements as recommended by your health care provider. What happens during an annual well check? The services and screenings done by your health care provider during your annual well check will depend on your age, overall health, lifestyle risk factors, and family history of disease. Counseling  Your health care provider may ask you questions about your:  Alcohol use.  Tobacco use.  Drug use.  Emotional well-being.  Home and relationship well-being.  Sexual activity.  Eating habits.  History of falls.  Memory and ability to understand (cognition).  Work and work Statistician. Screening  You may have the following tests or measurements:  Height, weight, and BMI.  Blood pressure.  Lipid and cholesterol levels. These may be checked every 5 years, or more frequently if you are over 16 years old.  Skin check.  Lung cancer screening. You may have this screening every year starting at age 15 if you have a 30-pack-year history of smoking and currently smoke or have quit within the past 15 years.  Fecal occult blood test (FOBT) of the stool. You may have this test every year starting at age 39.  Flexible sigmoidoscopy or colonoscopy. You may have a sigmoidoscopy every 5  years or a colonoscopy every 10 years starting at age 44.  Prostate cancer screening. Recommendations will vary depending on your family history and other risks.  Hepatitis C blood test.  Hepatitis B blood  test.  Sexually transmitted disease (STD) testing.  Diabetes screening. This is done by checking your blood sugar (glucose) after you have not eaten for a while (fasting). You may have this done every 1-3 years.  Abdominal aortic aneurysm (AAA) screening. You may need this if you are a current or former smoker.  Osteoporosis. You may be screened starting at age 68 if you are at high risk. Talk with your health care provider about your test results, treatment options, and if necessary, the need for more tests. Vaccines  Your health care provider may recommend certain vaccines, such as:  Influenza vaccine. This is recommended every year.  Tetanus, diphtheria, and acellular pertussis (Tdap, Td) vaccine. You may need a Td booster every 10 years.  Zoster vaccine. You may need this after age 18.  Pneumococcal 13-valent conjugate (PCV13) vaccine. One dose is recommended after age 16.  Pneumococcal polysaccharide (PPSV23) vaccine. One dose is recommended after age 106. Talk to your health care provider about which screenings and vaccines you need and how often you need them. This information is not intended to replace advice given to you by your health care provider. Make sure you discuss any questions you have with your health care provider. Document Released: 04/02/2015 Document Revised: 11/24/2015 Document Reviewed: 01/05/2015 Elsevier Interactive Patient Education  2017 [place] Prevention in the Home Falls can cause injuries. They can happen to people of all ages. There are many things you can do to make your home safe and to help prevent falls. What can I do on the outside of my home?  Regularly fix the edges of walkways and driveways and fix any cracks.  Remove anything that might make you trip as you walk through a door, such as a raised step or threshold.  Trim any bushes or trees on the path to your home.  Use bright outdoor lighting.  Clear any walking paths of  anything that might make someone trip, such as rocks or tools.  Regularly check to see if handrails are loose or broken. Make sure that both sides of any steps have handrails.  Any raised decks and porches should have guardrails on the edges.  Have any leaves, snow, or ice cleared regularly.  Use sand or salt on walking paths during winter.  Clean up any spills in your garage right away. This includes oil or grease spills. What can I do in the bathroom?  Use night lights.  Install grab bars by the toilet and in the tub and shower. Do not use towel bars as grab bars.  Use non-skid mats or decals in the tub or shower.  If you need to sit down in the shower, use a plastic, non-slip stool.  Keep the floor dry. Clean up any water that spills on the floor as soon as it happens.  Remove soap buildup in the tub or shower regularly.  Attach bath mats securely with double-sided non-slip rug tape.  Do not have throw rugs and other things on the floor that can make you trip. What can I do in the bedroom?  Use night lights.  Make sure that you have a light by your bed that is easy to reach.  Do not use any sheets or blankets that are too big for  your bed. They should not hang down onto the floor.  Have a firm chair that has side arms. You can use this for support while you get dressed.  Do not have throw rugs and other things on the floor that can make you trip. What can I do in the kitchen?  Clean up any spills right away.  Avoid walking on wet floors.  Keep items that you use a lot in easy-to-reach places.  If you need to reach something above you, use a strong step stool that has a grab bar.  Keep electrical cords out of the way.  Do not use floor polish or wax that makes floors slippery. If you must use wax, use non-skid floor wax.  Do not have throw rugs and other things on the floor that can make you trip. What can I do with my stairs?  Do not leave any items on the  stairs.  Make sure that there are handrails on both sides of the stairs and use them. Fix handrails that are broken or loose. Make sure that handrails are as long as the stairways.  Check any carpeting to make sure that it is firmly attached to the stairs. Fix any carpet that is loose or worn.  Avoid having throw rugs at the top or bottom of the stairs. If you do have throw rugs, attach them to the floor with carpet tape.  Make sure that you have a light switch at the top of the stairs and the bottom of the stairs. If you do not have them, ask someone to add them for you. What else can I do to help prevent falls?  Wear shoes that:  Do not have high heels.  Have rubber bottoms.  Are comfortable and fit you well.  Are closed at the toe. Do not wear sandals.  If you use a stepladder:  Make sure that it is fully opened. Do not climb a closed stepladder.  Make sure that both sides of the stepladder are locked into place.  Ask someone to hold it for you, if possible.  Clearly mark and make sure that you can see:  Any grab bars or handrails.  First and last steps.  Where the edge of each step is.  Use tools that help you move around (mobility aids) if they are needed. These include:  Canes.  Walkers.  Scooters.  Crutches.  Turn on the lights when you go into a dark area. Replace any light bulbs as soon as they burn out.  Set up your furniture so you have a clear path. Avoid moving your furniture around.  If any of your floors are uneven, fix them.  If there are any pets around you, be aware of where they are.  Review your medicines with your doctor. Some medicines can make you feel dizzy. This can increase your chance of falling. Ask your doctor what other things that you can do to help prevent falls. This information is not intended to replace advice given to you by your health care provider. Make sure you discuss any questions you have with your health care  provider. Document Released: 12/31/2008 Document Revised: 08/12/2015 Document Reviewed: 04/10/2014 Elsevier Interactive Patient Education  2017 [employer].

## 2019-07-18 NOTE — Telephone Encounter (Signed)
Ok please ask to send formal test results so I can compare to the normal values

## 2019-07-18 NOTE — Progress Notes (Signed)
Subjective:   Aaron Moss is a 74 y.o. male who presents for Medicare Annual/Subsequent preventive examination.  Review of Systems:  No ROS Medicare Wellness Visit Cardiac Risk Factors include: advanced age (>43men, >36 women);dyslipidemia;hypertension;male gender;obesity (BMI >30kg/m2)  Sleep Patterns: No issues with falling asleep; feels rested on waking after 6-7 hours of sleep; gets up 1-2 times to void. Home Safety/Smoke Alarms: Feels safe in home; Smoke alarms in place. Living environment: Lives in a 1-story home with his wife; No need for DME; good support system. Seat Belt Safety/Bike Helmet: Wears seat belt.     Objective:    Vitals: BP 140/70 (BP Location: Right Arm, Patient Position: Sitting, Cuff Size: Large)   Pulse 65   Temp 98.4 F (36.9 C)   Ht 5\' 6"  (1.676 m)   Wt 264 lb (119.7 kg)   SpO2 98%   BMI 42.61 kg/m   Body mass index is 42.61 kg/m.  Advanced Directives 07/18/2019 02/24/2019 02/04/2019 05/03/2018 03/23/2015 02/05/2015 07/02/2013  Does Patient Have a Medical Advance Directive? Yes No No No No Yes Patient does not have advance directive;Patient would not like information  Type of Advance Directive [place]  Does patient want to make changes to medical advance directive? No - Patient declined - - - - - -  Copy of [place] in Chart? No - copy requested - - - - Yes -  Would patient like information on creating a medical advance directive? - No - Patient declined No - Patient declined No - Patient declined No - patient declined information - -    Tobacco Social History   Tobacco Use  Smoking Status Former Smoker  . Packs/day: 1.00  . Years: 22.00  . Pack years: 22.00  . Types: Cigarettes  . Quit date: 03/20/1988  . Years since quitting: 31.3  Smokeless Tobacco Never Used     Counseling given: No   Clinical Intake:  Pre-visit preparation completed: Yes  Pain : 0-10 Pain Score: 4  Pain  Type: Chronic pain Pain Location: Knee Pain Orientation: Right, Left Pain Descriptors / Indicators: Aching, Discomfort Pain Onset: More than a month ago Pain Frequency: Intermittent Pain Relieving Factors: Tramadol  Pain Relieving Factors: Tramadol  BMI - recorded: 42.6 Nutritional Status: BMI > 30  Obese Nutritional Risks: Other (Comment) Diabetes: No  How often do you need to have someone help you when you read instructions, pamphlets, or other written materials from your doctor or pharmacy?: 1 - Never What is the last grade level you completed in school?: [employer] Needed?: No  Information entered by :: Deyonna Fitzsimmons N. Lowell Guitar, LPN  Past Medical History:  Diagnosis Date  . Colon cancer ([place]) 2000  . Diverticulosis of colon   . History of chemotherapy 2000   colon cancer  . History of colon cancer   . History of radiation therapy 08/06/13- 10/03/13   prostate 7800 cGy 40 sessions, seminal vesicles 5600 cGy 40 sessions  . Hyperlipidemia   . Hypertension   . Left knee DJD   . 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 COLON SURGERY  2000   colon cancer  . COLONOSCOPY    . EUS     pancreatic cyst  . KNEE ARTHROSCOPY  2007   LT- [facility]  . PROSTATE BIOPSY  05/23/13  gleason 7, volume 11.5 ml   Family History  Problem Relation Age of Onset  . Cancer Mother        Liver  . Cancer Brother        Lung  . Diabetes Brother   . Colon cancer Sister   . Cancer Maternal Grandmother        Breast  . Esophageal cancer Neg Hx   . Rectal cancer Neg Hx   . Stomach cancer Neg Hx    Social History   Socioeconomic History  . Marital status: Married    Spouse name: Not on file  . Number of children: Not on file  . Years of education: Not on file  . Highest education level: Not on file  Occupational History  . Not on file  Tobacco Use  . Smoking status: Former Smoker     Packs/day: 1.00    Years: 22.00    Pack years: 22.00    Types: Cigarettes    Quit date: 03/20/1988    Years since quitting: 31.3  . Smokeless tobacco: Never Used  Substance and Sexual Activity  . Alcohol use: Yes    Alcohol/week: 0.0 standard drinks    Comment: rare  . Drug use: No  . Sexual activity: Not Currently  Other Topics Concern  . Not on file  Social History Narrative  . Not on file   Social Determinants of Health   Financial Resource Strain:   . Difficulty of Paying Living Expenses:   Food Insecurity:   . Worried About Charity fundraiser in the Last Year:   . Arboriculturist in the Last Year:   Transportation Needs:   . Film/video editor (Medical):   Marland Kitchen Lack of Transportation (Non-Medical):   Physical Activity:   . Days of Exercise per Week:   . Minutes of Exercise per Session:   Stress:   . Feeling of Stress :   Social Connections:   . Frequency of Communication with Friends and Family:   . Frequency of Social Gatherings with Friends and Family:   . Attends Religious Services:   . Active Member of Clubs or Organizations:   . Attends Archivist Meetings:   Marland Kitchen Marital Status:     Outpatient Encounter Medications as of 07/18/2019  Medication Sig  . amLODipine (NORVASC) 10 MG tablet Take 1 tablet by mouth once daily  . aspirin EC 81 MG tablet Take 81 mg by mouth daily.    Marland Kitchen atorvastatin (LIPITOR) 40 MG tablet Take 1 tablet by mouth once daily  . benazepril (LOTENSIN) 40 MG tablet Take 1 tablet by mouth once daily  . hydrochlorothiazide (HYDRODIURIL) 25 MG tablet Take 1 tablet by mouth once daily  . metoprolol succinate (TOPROL-XL) 100 MG 24 hr tablet Take 1 tablet (100 mg total) by mouth daily. Take with or immediately following a meal.  . Nabumetone (RELAFEN DS) 1000 MG TABS Take 1 tablet by mouth daily as needed.  . potassium chloride (KLOR-CON) 10 MEQ tablet Take 3 tablets by mouth once daily  . traMADol (ULTRAM) 50 MG tablet TAKE 1 TABLET BY  MOUTH EVERY 6 HOURS AS NEEDED FOR PAIN  . Travoprost, BAK Free, (TRAVATAN) 0.004 % SOLN ophthalmic solution Place 1 drop into both eyes at bedtime.  . Vitamin D, Ergocalciferol, (DRISDOL) 1.25 MG (50000 UNIT) CAPS capsule Take 1 capsule (50,000 Units total) by mouth every 7 (seven) days.   No facility-administered encounter medications on file as of  07/18/2019.    Activities of Daily Living In your present state of health, do you have any difficulty performing the following activities: 07/18/2019  Hearing? N  Vision? N  Difficulty concentrating or making decisions? N  Walking or climbing stairs? N  Dressing or bathing? N  Doing errands, shopping? N  Preparing Food and eating ? N  Using the Toilet? N  In the past six months, have you accidently leaked urine? N  Do you have problems with loss of bowel control? N  Managing your Medications? N  Managing your Finances? N  Housekeeping or managing your Housekeeping? N  Some recent data might be hidden    Patient Care Team: Biagio Borg, MD as PCP - General   Assessment:   This is a routine wellness examination for Malykai.  Exercise Activities and Dietary recommendations Current Exercise Habits: Structured exercise class(goes to gym 3 days a week), Type of exercise: walking;treadmill;strength training/weights, Time (Minutes): 50, Frequency (Times/Week): 3, Weekly Exercise (Minutes/Week): 150, Intensity: Moderate, Exercise limited by: orthopedic condition(s)(knee pain)  Goals    . Exercise 150 minutes per week (moderate activity)     Go back 3 days a week for treadmill; stationary bike helps ROM;  Motivated to take back control of your time; will let the children know that 3 days a week, you will not be available  Feels better when you exercise with more energy    . Patient Stated     I want to lose weight by continuing to go to the gym and limit the amount of sugary drinks I consume.    . Patient Stated     Continue to be active by  going to the gym, lose about 20 pounds and work on my car.       Fall Risk Fall Risk  07/18/2019 05/06/2019 05/06/2019 05/03/2018 06/11/2017  Falls in the past year? 0 0 0 0 No  Number falls in past yr: 0 - - - -  Injury with Fall? 0 - - - -  Comment - - - - -  Risk for fall due to : Orthopedic patient - - - -  Follow up Falls evaluation completed;Education provided;Falls prevention discussed - - - -   Is the patient's home free of loose throw rugs in walkways, pet beds, electrical cords, etc?   yes      Grab bars in the bathroom? no      Handrails on the stairs?   no      Adequate lighting?   yes  Depression Screen PHQ 2/9 Scores 07/18/2019 05/06/2019 05/06/2019 05/03/2018  PHQ - 2 Score 0 0 0 0  PHQ- 9 Score - - - -    Cognitive Function MMSE - Mini Mental State Exam 02/05/2015  Not completed: (No Data)     6CIT Screen 07/18/2019  What Year? 0 points  What month? 0 points  What time? 0 points  Count back from 20 0 points  Months in reverse 0 points  Repeat phrase 0 points  Total Score 0    Immunization History  Administered Date(s) Administered  . Influenza Whole 04/30/2009, 04/25/2010  . Influenza, High Dose Seasonal PF 12/05/2016, 02/17/2018, 12/12/2018  . Influenza,inj,Quad PF,6+ Mos 03/05/2013, 04/27/2014, 11/30/2015  . Influenza-Unspecified 11/19/2014  . PFIZER SARS-COV-2 Vaccination 04/10/2019, 04/28/2019  . Pneumococcal Conjugate-13 03/26/2013  . Pneumococcal Polysaccharide-23 06/08/2009  . Td 03/21/2003  . Tdap 05/28/2015  . Zoster 02/19/2006    Qualifies for Shingles Vaccine? Yes  Screening  Tests Health Maintenance  Topic Date Due  . INFLUENZA VACCINE  10/19/2019  . COLONOSCOPY  01/11/2023  . TETANUS/TDAP  05/27/2025  . COVID-19 Vaccine  Completed  . Hepatitis C Screening  Completed  . PNA vac Low Risk Adult  Completed   Cancer Screenings: Lung: Low Dose CT Chest recommended if Age 7-80 years, 30 pack-year currently smoking OR have quit w/in  15years. Patient does not qualify. Colorectal: Yes      Plan:     Reviewed health maintenance screenings with patient today and relevant education, vaccines, and/or referrals were provided.    Continue doing brain stimulating activities (puzzles, reading, adult coloring books, staying active) to keep memory sharp.    Continue to eat heart healthy diet (full of fruits, vegetables, whole grains, lean protein, water--limit salt, fat, and sugar intake) and increase physical activity as tolerated.  I have personally reviewed and noted the following in the patient's chart:   . Medical and social history . Use of alcohol, tobacco or illicit drugs  . Current medications and supplements . Functional ability and status . Nutritional status . Physical activity . Advanced directives . List of other physicians . Hospitalizations, surgeries, and ER visits in previous 12 months . Vitals . Screenings to include cognitive, depression, and falls . Referrals and appointments  In addition, I have reviewed and discussed with patient certain preventive protocols, quality metrics, and best practice recommendations. A written personalized care plan for preventive services as well as general preventive health recommendations were provided to patient.     Sheral Flow, LPN  [id number] Nurse Health Advisor

## 2019-07-18 NOTE — Telephone Encounter (Signed)
Left voicemail for Formal test results be faxed to the clinic as soon as possible.

## 2019-07-30 ENCOUNTER — Other Ambulatory Visit: Payer: Self-pay | Admitting: Internal Medicine

## 2019-07-30 NOTE — Telephone Encounter (Signed)
Please refill as per office routine med refill policy (all routine meds refilled for 3 mo or monthly per pt preference up to one year from last visit, then month to month grace period for 3 mo, then further med refills will have to be denied)  

## 2019-08-08 ENCOUNTER — Other Ambulatory Visit: Payer: Self-pay | Admitting: Internal Medicine

## 2019-08-08 NOTE — Telephone Encounter (Signed)
Please refill as per office routine med refill policy (all routine meds refilled for 3 mo or monthly per pt preference up to one year from last visit, then month to month grace period for 3 mo, then further med refills will have to be denied)  

## 2019-08-15 ENCOUNTER — Other Ambulatory Visit: Payer: Self-pay | Admitting: Internal Medicine

## 2019-08-22 ENCOUNTER — Other Ambulatory Visit: Payer: Self-pay | Admitting: Internal Medicine

## 2019-08-22 DIAGNOSIS — M5136 Other intervertebral disc degeneration, lumbar region: Secondary | ICD-10-CM

## 2019-08-22 DIAGNOSIS — G8929 Other chronic pain: Secondary | ICD-10-CM

## 2019-08-26 ENCOUNTER — Telehealth: Payer: Self-pay | Admitting: Internal Medicine

## 2019-08-26 MED ORDER — DICLOFENAC SODIUM 75 MG PO TBEC: 75.0000 mg | DELAYED_RELEASE_TABLET | Freq: Two times a day (BID) | ORAL | 3 refills | Status: DC

## 2019-08-26 NOTE — Telephone Encounter (Signed)
   Pt c/o medication issue:  1. Name of Medication: RELAFEN DS 1000 MG TABS  2. How are you currently taking this medication (dosage and times per day)? As written  3. Are you having a reaction (difficulty breathing--STAT)? no  4. What is your medication issue? Patient states med too costly ($323 for 30 days)

## 2019-08-26 NOTE — Telephone Encounter (Signed)
New message:   Pt is calling and states he wanted the Diclofenac 75 mg called in not the Relafen. He states he used to take this and they work about the same and it is only $10. Please advise.

## 2019-08-26 NOTE — Telephone Encounter (Signed)
Done erx to [facility]

## 2019-09-05 ENCOUNTER — Other Ambulatory Visit: Payer: Self-pay | Admitting: Internal Medicine

## 2019-09-05 NOTE — Telephone Encounter (Signed)
Please refill as per office routine med refill policy (all routine meds refilled for 3 mo or monthly per pt preference up to one year from last visit, then month to month grace period for 3 mo, then further med refills will have to be denied)  

## 2019-10-02 ENCOUNTER — Ambulatory Visit: Payer: Medicare Other | Admitting: Internal Medicine

## 2019-10-04 ENCOUNTER — Other Ambulatory Visit: Payer: Self-pay | Admitting: Internal Medicine

## 2019-10-04 NOTE — Telephone Encounter (Signed)
Done erx 

## 2019-10-24 ENCOUNTER — Other Ambulatory Visit: Payer: Self-pay | Admitting: Internal Medicine

## 2019-10-24 NOTE — Telephone Encounter (Signed)
Please refill as per office routine med refill policy (all routine meds refilled for 3 mo or monthly per pt preference up to one year from last visit, then month to month grace period for 3 mo, then further med refills will have to be denied)  

## 2019-10-30 ENCOUNTER — Telehealth (INDEPENDENT_AMBULATORY_CARE_PROVIDER_SITE_OTHER): Payer: Medicare Other | Admitting: Internal Medicine

## 2019-10-30 DIAGNOSIS — R739 Hyperglycemia, unspecified: Secondary | ICD-10-CM

## 2019-10-30 DIAGNOSIS — I10 Essential (primary) hypertension: Secondary | ICD-10-CM

## 2019-10-30 DIAGNOSIS — R194 Change in bowel habit: Secondary | ICD-10-CM | POA: Diagnosis not present

## 2019-10-30 NOTE — Progress Notes (Signed)
Patient ID: Aaron Moss, male   DOB: January 13, 1946, 74 y.o.   MRN: [id number]  Virtual Visit via Video Note  I connected with Harlene Salts on 10/30/19 at  3:20 PM EDT by a video enabled telemedicine application and verified that I am speaking with the correct person using two identifiers.  Location of all participants today Patient: at home Provider: at office   I discussed the limitations of evaluation and management by telemedicine and the availability of in person appointments. The patient expressed understanding and agreed to proceed.  History of Present Illness: Here with c/o 1 mo intermittent lower abd crampy pains and some increased passing gas, mild, lying down makes worse, seems to have o/w normal BM's so not clear if constipated, but no fever, n/v, blood decreased appetite or wt loss.  Denies worsening reflux, other abd pain, dysphagia  Pt denies chest pain, increased sob or doe, wheezing, orthopnea, PND, increased LE swelling, palpitations, dizziness or syncope.   Pt denies polydipsia, polyuria.  BP at home < 140/90 Wt Readings from Last 3 Encounters:  07/18/19 264 lb (119.7 kg)  07/02/19 263 lb 4 oz (119.4 kg)  05/06/19 268 lb 12.8 oz (121.9 kg)   Past Medical History:  Diagnosis Date  . Colon cancer ([place]) 2000  . Diverticulosis of colon   . History of chemotherapy 2000   colon cancer  . History of colon cancer   . History of radiation therapy 08/06/13- 10/03/13   prostate 7800 cGy 40 sessions, seminal vesicles 5600 cGy 40 sessions  . Hyperlipidemia   . Hypertension   . Left knee DJD   . 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 COLON SURGERY  2000   colon cancer  . COLONOSCOPY    . EUS     pancreatic cyst  . KNEE ARTHROSCOPY  2007   [facility]- [facility]  . PROSTATE BIOPSY  05/23/13   gleason 7, volume 11.5 ml    reports that he quit smoking about 31 years ago. His smoking use included  cigarettes. He has a 22.00 pack-year smoking history. He has never used smokeless tobacco. He reports current alcohol use. He reports that he does not use drugs. family history includes Cancer in his brother, maternal grandmother, and mother; Colon cancer in his sister; Diabetes in his brother. Allergies  Allergen Reactions  . Pregabalin     REACTION: felt bad   Current Outpatient Medications on File Prior to Visit  Medication Sig Dispense Refill  . amLODipine (NORVASC) 10 MG tablet Take 1 tablet by mouth once daily 90 tablet 0  . aspirin EC 81 MG tablet Take 81 mg by mouth daily.      Marland Kitchen atorvastatin (LIPITOR) 40 MG tablet Take 1 tablet by mouth once daily 90 tablet 1  . benazepril (LOTENSIN) 40 MG tablet Take 1 tablet by mouth once daily 90 tablet 2  . diclofenac (VOLTAREN) 75 MG EC tablet Take 1 tablet (75 mg total) by mouth 2 (two) times daily. 60 tablet 3  . hydrochlorothiazide (HYDRODIURIL) 25 MG tablet Take 1 tablet by mouth once daily 90 tablet 1  . metoprolol succinate (TOPROL-XL) 100 MG 24 hr tablet Take 1 tablet (100 mg total) by mouth daily. Take with or immediately following a meal. 90 tablet 3  . potassium chloride (KLOR-CON) 10 MEQ tablet Take 3 tablets by mouth once daily 270 tablet 0  . traMADol (  ULTRAM) 50 MG tablet TAKE 1 TABLET BY MOUTH EVERY 6 HOURS AS NEEDED FOR PAIN 120 tablet 2  . Travoprost, BAK Free, (TRAVATAN) 0.004 % SOLN ophthalmic solution Place 1 drop into both eyes at bedtime.    . Vitamin D, Ergocalciferol, (DRISDOL) 1.25 MG (50000 UNIT) CAPS capsule Take 1 capsule (50,000 Units total) by mouth every 7 (seven) days. 12 capsule 0   No current facility-administered medications on file prior to visit.   Observations/Objective: Alert, NAD, appropriate mood and affect, resps normal, cn 2-12 intact, moves all 4s, no visible rash or swelling Lab Results  Component Value Date   WBC 9.1 05/06/2019   HGB 14.9 05/06/2019   HCT 45.3 05/06/2019   PLT 215.0 05/06/2019    GLUCOSE 96 05/06/2019   CHOL 157 05/06/2019   TRIG 119.0 05/06/2019   HDL 36.70 (L) 05/06/2019   LDLDIRECT 185.6 05/15/2007   LDLCALC 96 05/06/2019   ALT 13 05/06/2019   AST 14 05/06/2019   NA 143 05/06/2019   K 3.2 (L) 05/06/2019   CL 105 05/06/2019   CREATININE 1.12 05/06/2019   BUN 14 05/06/2019   CO2 32 05/06/2019   TSH 1.28 05/06/2019   PSA 0.06 (L) 05/06/2019   HGBA1C 6.8 (H) 05/06/2019   Assessment and Plan: See notes  Follow Up Instructions: Seen notes   I discussed the assessment and treatment plan with the patient. The patient was provided an opportunity to ask questions and all were answered. The patient agreed with the plan and demonstrated an understanding of the instructions.   The patient was advised to call back or seek an in-person evaluation if the symptoms worsen or if the condition fails to improve as anticipated.  Cathlean Cower, MD

## 2019-11-01 ENCOUNTER — Encounter: Payer: Self-pay | Admitting: Internal Medicine

## 2019-11-01 DIAGNOSIS — R194 Change in bowel habit: Secondary | ICD-10-CM | POA: Insufficient documentation

## 2019-11-01 NOTE — Patient Instructions (Signed)
Please take all new medication as prescribed - the miralax

## 2019-11-01 NOTE — Assessment & Plan Note (Addendum)
I suspect underlying mild constipation, for daily miralax trial, and  to f/u any worsening symptoms or concerns  I spent 31 minutes in preparing to see the patient by review of recent labs, imaging and procedures, obtaining and reviewing separately obtained history, communicating with the patient and family or caregiver, ordering medications, tests or procedures, and documenting clinical information in the EHR including the differential Dx, treatment, and any further evaluation and other management of change in bowel habits, hyperglycemia, htn

## 2019-11-01 NOTE — Assessment & Plan Note (Signed)
stable overall by history and exam, recent data reviewed with pt, and pt to continue medical treatment as before,  to f/u any worsening symptoms or concerns  

## 2019-11-01 NOTE — Assessment & Plan Note (Signed)
To continue to monitor bP at home and next visit

## 2019-11-12 ENCOUNTER — Other Ambulatory Visit: Payer: Self-pay | Admitting: Internal Medicine

## 2019-11-12 NOTE — Telephone Encounter (Signed)
Please refill as per office routine med refill policy (all routine meds refilled for 3 mo or monthly per pt preference up to one year from last visit, then month to month grace period for 3 mo, then further med refills will have to be denied)  

## 2019-11-27 ENCOUNTER — Other Ambulatory Visit: Payer: Self-pay | Admitting: Internal Medicine

## 2019-11-27 NOTE — Telephone Encounter (Signed)
Please refill as per office routine med refill policy (all routine meds refilled for 3 mo or monthly per pt preference up to one year from last visit, then month to month grace period for 3 mo, then further med refills will have to be denied)  

## 2019-12-23 ENCOUNTER — Telehealth (INDEPENDENT_AMBULATORY_CARE_PROVIDER_SITE_OTHER): Payer: Medicare Other | Admitting: Family Medicine

## 2019-12-23 ENCOUNTER — Encounter: Payer: Self-pay | Admitting: Family Medicine

## 2019-12-23 VITALS — BP 137/76 | Wt 256.0 lb

## 2019-12-23 DIAGNOSIS — R1013 Epigastric pain: Secondary | ICD-10-CM | POA: Diagnosis not present

## 2019-12-23 MED ORDER — ESOMEPRAZOLE MAGNESIUM 20 MG PO CPDR: 20.0000 mg | DELAYED_RELEASE_CAPSULE | Freq: Every day | ORAL | 0 refills | Status: DC

## 2019-12-23 NOTE — Progress Notes (Signed)
Virtual Visit via Video Note  I connected with Aaron Moss  on 12/23/19 at  3:40 PM EDT by a video enabled telemedicine application and verified that I am speaking with the correct person using two identifiers.  Location patient: home, [place] Location provider:work or home office Persons participating in the virtual visit: patient, provider  I discussed the limitations of evaluation and management by telemedicine and the availability of in person appointments. The patient expressed understanding and agreed to proceed.   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  Past Medical History:  Diagnosis Date  . Colon cancer ([place]) 2000  . Diverticulosis of colon   . History of chemotherapy 2000   colon cancer  . History of colon cancer   . History of radiation therapy 08/06/13- 10/03/13   prostate 7800 cGy 40 sessions, seminal vesicles 5600 cGy 40 sessions  . Hyperlipidemia   . Hypertension   . Left knee DJD   . 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 COLON SURGERY  2000   colon cancer  . COLONOSCOPY    . EUS     pancreatic cyst  . KNEE ARTHROSCOPY  2007   [facility]- [facility]  . PROSTATE BIOPSY  05/23/13   gleason 7, volume 11.5 ml     Current Outpatient Medications:  .  amLODipine (NORVASC) 10 MG tablet, Take 1 tablet by mouth once daily, Disp: 90 tablet,  Rfl: 0 .  aspirin EC 81 MG tablet, Take 81 mg by mouth daily.  , Disp: , Rfl:  .  atorvastatin (LIPITOR) 40 MG tablet, Take 1 tablet by mouth once daily, Disp: 90 tablet, Rfl: 1 .  benazepril (LOTENSIN) 40 MG tablet, Take 1 tablet by mouth once daily, Disp: 90 tablet, Rfl: 2 .  diclofenac (VOLTAREN) 75 MG EC tablet, Take 1 tablet (75 mg total) by mouth 2 (two) times daily., Disp: 60 tablet, Rfl: 3 .  hydrochlorothiazide (HYDRODIURIL) 25 MG tablet, Take 1 tablet by mouth once daily, Disp: 90 tablet, Rfl: 0 .  potassium chloride (KLOR-CON) 10 MEQ tablet, Take 3 tablets by mouth once daily, Disp: 270 tablet, Rfl: 0 .  Simethicone (GAS-X PO), Take by mouth., Disp: , Rfl:  .  traMADol (ULTRAM) 50 MG tablet, TAKE 1 TABLET BY MOUTH EVERY 6 HOURS AS NEEDED FOR PAIN, Disp: 120 tablet, Rfl: 2 .  Travoprost, BAK Free, (TRAVATAN) 0.004 % SOLN ophthalmic solution, Place 1 drop into both eyes at bedtime., Disp: , Rfl:  .  Vitamin D, Ergocalciferol, (DRISDOL) 1.25 MG (50000 UNIT) CAPS capsule, Take 1 capsule (50,000 Units total) by mouth every 7 (seven) days., Disp: 12 capsule, Rfl: 0 .  esomeprazole (NEXIUM) 20 MG capsule, Take 1 capsule (20 mg total) by mouth daily at 12 noon., Disp: 14 capsule, Rfl: 0 .  metoprolol succinate (TOPROL-XL) 100 MG 24 hr tablet, Take 1  tablet (100 mg total) by mouth daily. Take with or immediately following a meal. (Patient not taking: Reported on 12/23/2019), Disp: 90 tablet, Rfl: 3 .  polyethylene glycol (MIRALAX / GLYCOLAX) 17 g packet, Take 17 g by mouth daily. (Patient not taking: Reported on 12/23/2019), Disp: , Rfl:   EXAM:  VITALS per patient if applicable:  GENERAL: alert, oriented, appears well and in no acute distress  HEENT: atraumatic, conjunttiva clear, no obvious abnormalities on inspection of external nose and ears  NECK: normal movements of the head and neck  LUNGS: on inspection no signs of respiratory distress, breathing rate appears normal, no obvious gross  SOB, gasping or wheezing  CV: no obvious cyanosis  ABD: Points to epigastric region as area of concern  MS: moves all visible extremities without noticeable abnormality  PSYCH/NEURO: pleasant and cooperative, no obvious depression or anxiety, speech and thought processing grossly intact  ASSESSMENT AND PLAN:  Discussed the following assessment and plan:  Epigastric pain  -we discussed possible serious and likely etiologies, options for evaluation and workup, limitations of telemedicine visit vs in person visit, treatment, treatment risks and precautions. Pt prefers to treat via telemedicine empirically rather than in person at this moment.  There are a number of etiologies which can cause pain in this area of the stomach.  Opted to start Nexium 20 mg once daily and also advised small healthy meals, with avoidance of excessive amounts of red meat and dairy.  Advised follow-up with his primary care doctor in 1 to 2 weeks to recheck and to pursue further evaluation if his symptoms have not resolved.  Advise it is very important to keep this follow-up and advised him to contact his PCP office if he has not heard from them in the next 24 hours.  Scheduled follow up with PCP offered: Sent message to schedulers to assist and advised patient to contact PCP office to schedule if does not receive call back in next 24 hours.  Advised to seek prompt follow up telemedicine visit or in person care if worsening, new symptoms arise, or if is not improving with treatment. Did let this patient know that I only do telemedicine on Tuesdays and Thursdays for [place]. Advised to schedule follow up visit with PCP or [facility] if any further questions or concerns to avoid delays in care.   I discussed the assessment and treatment plan with the patient. The patient was provided an opportunity to ask questions and all were answered. The patient agreed with the plan and demonstrated an understanding of the instructions.      Lucretia Kern, DO

## 2019-12-23 NOTE — Patient Instructions (Addendum)
-  start the Nexium 20mg  once daily   -eat small healthy meals and avoid sugar, sweets and excessive read meat and dairy  -Schedule a follow up visit with your primary care doctor in 1 to 2 weeks, preferably an in person visit with Dr. Jenny Reichmann if he is available.  I hope you are feeling better soon! Seek care promptly in the interim if your symptoms worsen, new concerns arise or you are not improving with treatment.

## 2020-01-18 ENCOUNTER — Other Ambulatory Visit: Payer: Self-pay | Admitting: Internal Medicine

## 2020-01-18 NOTE — Telephone Encounter (Signed)
Please refill as per office routine med refill policy (all routine meds refilled for 3 mo or monthly per pt preference up to one year from last visit, then month to month grace period for 3 mo, then further med refills will have to be denied)  

## 2020-02-26 ENCOUNTER — Other Ambulatory Visit: Payer: Self-pay | Admitting: Internal Medicine

## 2020-02-29 ENCOUNTER — Other Ambulatory Visit: Payer: Self-pay | Admitting: Internal Medicine

## 2020-02-29 NOTE — Telephone Encounter (Signed)
Please refill as per office routine med refill policy (all routine meds refilled for 3 mo or monthly per pt preference up to one year from last visit, then month to month grace period for 3 mo, then further med refills will have to be denied)  

## 2020-03-24 ENCOUNTER — Other Ambulatory Visit: Payer: Self-pay | Admitting: Internal Medicine

## 2020-03-31 ENCOUNTER — Other Ambulatory Visit: Payer: Self-pay | Admitting: Internal Medicine

## 2020-03-31 NOTE — Telephone Encounter (Signed)
Tramadol refilled  Please to ask to ask pt to make yearly ROV in feb 2022

## 2020-04-27 ENCOUNTER — Other Ambulatory Visit: Payer: Self-pay | Admitting: Internal Medicine

## 2020-04-27 NOTE — Telephone Encounter (Signed)
Please refill as per office routine med refill policy (all routine meds refilled for 3 mo or monthly per pt preference up to one year from last visit, then month to month grace period for 3 mo, then further med refills will have to be denied)  

## 2020-05-15 ENCOUNTER — Other Ambulatory Visit: Payer: Self-pay | Admitting: Internal Medicine

## 2020-05-27 ENCOUNTER — Other Ambulatory Visit: Payer: Self-pay | Admitting: Internal Medicine

## 2020-05-27 NOTE — Telephone Encounter (Signed)
Please refill as per office routine med refill policy (all routine meds refilled for 3 mo or monthly per pt preference up to one year from last visit, then month to month grace period for 3 mo, then further med refills will have to be denied)  

## 2020-06-21 ENCOUNTER — Other Ambulatory Visit: Payer: Self-pay

## 2020-06-21 ENCOUNTER — Ambulatory Visit: Payer: Medicare Other | Admitting: Internal Medicine

## 2020-06-21 ENCOUNTER — Encounter: Payer: Self-pay | Admitting: Internal Medicine

## 2020-06-21 VITALS — BP 180/78 | HR 83 | Temp 98.0°F | Ht 66.0 in | Wt 247.8 lb

## 2020-06-21 DIAGNOSIS — Z23 Encounter for immunization: Secondary | ICD-10-CM | POA: Diagnosis not present

## 2020-06-21 DIAGNOSIS — R739 Hyperglycemia, unspecified: Secondary | ICD-10-CM | POA: Diagnosis not present

## 2020-06-21 DIAGNOSIS — I10 Essential (primary) hypertension: Secondary | ICD-10-CM

## 2020-06-21 DIAGNOSIS — S51851A Open bite of right forearm, initial encounter: Secondary | ICD-10-CM

## 2020-06-21 DIAGNOSIS — W540XXA Bitten by dog, initial encounter: Secondary | ICD-10-CM

## 2020-06-21 NOTE — Progress Notes (Signed)
Patient ID: Aaron Moss, male   DOB: 1945/11/25, 75 y.o.   MRN: [id number]   Patient notified that insurance may not cover this vaccinated. Patient wishes to proceed.        Chief Complaint: dog bite       HPI:  Aaron Moss is a 75 y.o. male here with c/o pit bull dog bite to right mid post arm which actually turned into more a scratch and gouge as he was wearing a shirt at the time.  Had some some bleeding minor now resolved, and starting to heal, but jsut wanted it checked.  No red, tender, swelling worsening or drainage.  Pain is mild to touch mostly, and denies fever, chills.  Last Tdap about 5 yrs.  Pt denies chest pain, increased sob or doe, wheezing, orthopnea, PND, increased LE swelling, palpitations, dizziness or syncope.   Pt denies polydipsia, polyuria, No other new complaints  Dog is property of neighbor, no concern about rabies       Wt Readings from Last 3 Encounters:  06/21/20 247 lb 12.8 oz (112.4 kg)  12/23/19 256 lb (116.1 kg)  07/18/19 264 lb (119.7 kg)   BP Readings from Last 3 Encounters:  06/21/20 (!) 180/78  12/23/19 137/76  07/18/19 140/70         Past Medical History:  Diagnosis Date  . Colon cancer ([place]) 2000  . Diverticulosis of colon   . History of chemotherapy 2000   colon cancer  . History of colon cancer   . History of radiation therapy 08/06/13- 10/03/13   prostate 7800 cGy 40 sessions, seminal vesicles 5600 cGy 40 sessions  . Hyperlipidemia   . Hypertension   . Left knee DJD   . 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 COLON SURGERY  2000   colon cancer  . COLONOSCOPY    . EUS     pancreatic cyst  . KNEE ARTHROSCOPY  2007   LT- [facility]  . PROSTATE BIOPSY  05/23/13   gleason 7, volume 11.5 ml    reports that he quit smoking about 32 years ago. His smoking use included cigarettes. He has a 22.00 pack-year smoking history. He has never used smokeless tobacco.  He reports current alcohol use. He reports that he does not use drugs. family history includes Cancer in his brother, maternal grandmother, and mother; Colon cancer in his sister; Diabetes in his brother. Allergies  Allergen Reactions  . Pregabalin     REACTION: felt bad   Current Outpatient Medications on File Prior to Visit  Medication Sig Dispense Refill  . amLODipine (NORVASC) 10 MG tablet Take 1 tablet by mouth once daily 30 tablet 0  . aspirin EC 81 MG tablet Take 81 mg by mouth daily.    Marland Kitchen atorvastatin (LIPITOR) 40 MG tablet Take 1 tablet by mouth once daily 90 tablet 0  . benazepril (LOTENSIN) 40 MG tablet Take 1 tablet by mouth once daily 90 tablet 0  . diclofenac (VOLTAREN) 75 MG EC tablet Take 1 tablet by mouth twice daily 60 tablet 2  . esomeprazole (NEXIUM) 20 MG capsule Take 1 capsule (20 mg total) by mouth daily at 12 noon. 14 capsule 0  . hydrochlorothiazide (HYDRODIURIL) 25 MG tablet Take 1 tablet by mouth once daily 30 tablet 0  . polyethylene glycol (MIRALAX / GLYCOLAX) 17 g packet Take 17 g by mouth daily.    Marland Kitchen  potassium chloride (KLOR-CON) 10 MEQ tablet Take 3 tablets by mouth once daily 270 tablet 1  . Simethicone (GAS-X PO) Take by mouth.    . traMADol (ULTRAM) 50 MG tablet TAKE 1 TABLET BY MOUTH EVERY 6 HOURS AS NEEDED FOR PAIN 120 tablet 2  . Travoprost, BAK Free, (TRAVATAN) 0.004 % SOLN ophthalmic solution Place 1 drop into both eyes at bedtime.    . Vitamin D, Ergocalciferol, (DRISDOL) 1.25 MG (50000 UNIT) CAPS capsule Take 1 capsule (50,000 Units total) by mouth every 7 (seven) days. 12 capsule 0  . metoprolol succinate (TOPROL-XL) 100 MG 24 hr tablet Take 1 tablet (100 mg total) by mouth daily. Take with or immediately following a meal. (Patient not taking: Reported on 12/23/2019) 90 tablet 3   No current facility-administered medications on file prior to visit.        ROS:  All others reviewed and negative.  Objective        PE:  BP (!) 180/78   Pulse 83    Temp 98 F (36.7 C) (Oral)   Ht 5\' 6"  (1.676 m)   Wt 247 lb 12.8 oz (112.4 kg)   SpO2 98%   BMI 40.00 kg/m                 Constitutional: Pt appears in NAD               HENT: Head: NCAT.                Right Ear: External ear normal.                 Left Ear: External ear normal.                Eyes: . Pupils are equal, round, and reactive to light. Conjunctivae and EOM are normal               Nose: without d/c or deformity               Neck: Neck supple. Gross normal ROM               Cardiovascular: Normal rate and regular rhythm.                 Pulmonary/Chest: Effort normal and breath sounds without rales or wheezing.                Abd:  Soft, NT, ND, + BS, no organomegaly               Neurological: Pt is alert. At baseline orientation, motor grossly intact               Skin: LE edema - none. Right mid post arm with 5 somewhat linear scratch/gouges the worse about 5 mm deep without red, tender, swelling or red streaks or drainage.               Psychiatric: Pt behavior is normal without agitation   Micro: none  Cardiac tracings I have personally interpreted today:  none  Pertinent Radiological findings (summarize): none   Lab Results  Component Value Date   WBC 9.1 05/06/2019   HGB 14.9 05/06/2019   HCT 45.3 05/06/2019   PLT 215.0 05/06/2019   GLUCOSE 96 05/06/2019   CHOL 157 05/06/2019   TRIG 119.0 05/06/2019   HDL 36.70 (L) 05/06/2019   LDLDIRECT 185.6 05/15/2007   LDLCALC 96 05/06/2019   ALT 13 05/06/2019  AST 14 05/06/2019   NA 143 05/06/2019   K 3.2 (L) 05/06/2019   CL 105 05/06/2019   CREATININE 1.12 05/06/2019   BUN 14 05/06/2019   CO2 32 05/06/2019   TSH 1.28 05/06/2019   PSA 0.06 (L) 05/06/2019   HGBA1C 6.8 (H) 05/06/2019   Assessment/Plan:  Aaron Moss is a 75 y.o. Black or African American [2] male with  has a past medical history of Colon cancer ([place]) (2000), Diverticulosis of colon, History of chemotherapy (2000), History of colon  cancer, History of radiation therapy (08/06/13- 10/03/13), Hyperlipidemia, Hypertension, Left knee DJD, and Prostate cancer ([place]) (05/23/13).  Dog bite Mild overall, arm protected by shirt at the time, deep scratches only, no evidence for cellulitis or other significant tissue damage or arm dysfunction, no concern for rabies, for Tdap, and follow closely for evidence of infection but appaers to be able to hold on antibx at this time  Essential hypertension BP Readings from Last 3 Encounters:  06/21/20 (!) 180/78  12/23/19 137/76  07/18/19 140/70   Stable overall, likely reactive today, pt to continue to monitor and cont medical treatment norvasc, lotensin, hct, toprol   Hyperglycemia Lab Results  Component Value Date   HGBA1C 6.8 (H) 05/06/2019   Stable, pt to continue current medical treatment  - diet   Followup: Return if symptoms worsen or fail to improve.  Cathlean Cower, MD 06/26/2020 7:56 PM [facility]

## 2020-06-21 NOTE — Patient Instructions (Signed)
You had the Tdap tetanus shot today  Please call if you see new signs of infection such as pain, redness and swelling  Please continue all other medications as before, and refills have been done if requested.  Please have the pharmacy call with any other refills you may need.  Please keep your appointments with your specialists as you may have planned

## 2020-06-26 ENCOUNTER — Encounter: Payer: Self-pay | Admitting: Internal Medicine

## 2020-06-26 ENCOUNTER — Other Ambulatory Visit: Payer: Self-pay | Admitting: Internal Medicine

## 2020-06-26 DIAGNOSIS — W540XXA Bitten by dog, initial encounter: Secondary | ICD-10-CM | POA: Insufficient documentation

## 2020-06-26 NOTE — Telephone Encounter (Signed)
Please refill as per office routine med refill policy (all routine meds refilled for 3 mo or monthly per pt preference up to one year from last visit, then month to month grace period for 3 mo, then further med refills will have to be denied)  

## 2020-06-26 NOTE — Assessment & Plan Note (Signed)
Lab Results  Component Value Date   HGBA1C 6.8 (H) 05/06/2019   Stable, pt to continue current medical treatment  - diet

## 2020-06-26 NOTE — Assessment & Plan Note (Signed)
Mild overall, arm protected by shirt at the time, deep scratches only, no evidence for cellulitis or other significant tissue damage or arm dysfunction, no concern for rabies, for Tdap, and follow closely for evidence of infection but appaers to be able to hold on antibx at this time

## 2020-06-26 NOTE — Assessment & Plan Note (Signed)
BP Readings from Last 3 Encounters:  06/21/20 (!) 180/78  12/23/19 137/76  07/18/19 140/70   Stable overall, likely reactive today, pt to continue to monitor and cont medical treatment norvasc, lotensin, hct, toprol

## 2020-07-07 ENCOUNTER — Ambulatory Visit: Payer: Medicare Other | Admitting: Internal Medicine

## 2020-07-07 ENCOUNTER — Other Ambulatory Visit: Payer: Self-pay

## 2020-07-07 VITALS — BP 160/78 | HR 74 | Temp 98.6°F | Ht 66.0 in | Wt 249.0 lb

## 2020-07-07 DIAGNOSIS — Z0001 Encounter for general adult medical examination with abnormal findings: Secondary | ICD-10-CM | POA: Diagnosis not present

## 2020-07-07 DIAGNOSIS — E538 Deficiency of other specified B group vitamins: Secondary | ICD-10-CM | POA: Diagnosis not present

## 2020-07-07 DIAGNOSIS — I10 Essential (primary) hypertension: Secondary | ICD-10-CM

## 2020-07-07 DIAGNOSIS — R739 Hyperglycemia, unspecified: Secondary | ICD-10-CM | POA: Diagnosis not present

## 2020-07-07 DIAGNOSIS — E559 Vitamin D deficiency, unspecified: Secondary | ICD-10-CM

## 2020-07-07 DIAGNOSIS — E876 Hypokalemia: Secondary | ICD-10-CM

## 2020-07-07 DIAGNOSIS — C61 Malignant neoplasm of prostate: Secondary | ICD-10-CM | POA: Diagnosis not present

## 2020-07-07 DIAGNOSIS — E7800 Pure hypercholesterolemia, unspecified: Secondary | ICD-10-CM | POA: Diagnosis not present

## 2020-07-07 LAB — HEPATIC FUNCTION PANEL
ALT: 12 U/L (ref 0–53)
AST: 13 U/L (ref 0–37)
Albumin: 3.6 g/dL (ref 3.5–5.2)
Alkaline Phosphatase: 108 U/L (ref 39–117)
Bilirubin, Direct: 0.2 mg/dL (ref 0.0–0.3)
Total Bilirubin: 0.8 mg/dL (ref 0.2–1.2)
Total Protein: 6.6 g/dL (ref 6.0–8.3)

## 2020-07-07 LAB — VITAMIN B12: Vitamin B-12: 343 pg/mL (ref 211–911)

## 2020-07-07 LAB — PSA: PSA: 0.08 ng/mL — ABNORMAL LOW (ref 0.10–4.00)

## 2020-07-07 LAB — CBC WITH DIFFERENTIAL/PLATELET
Basophils Absolute: 0 10*3/uL (ref 0.0–0.1)
Basophils Relative: 0.5 % (ref 0.0–3.0)
Eosinophils Absolute: 0.2 10*3/uL (ref 0.0–0.7)
Eosinophils Relative: 2.5 % (ref 0.0–5.0)
HCT: 39.5 % (ref 39.0–52.0)
Hemoglobin: 12.7 g/dL — ABNORMAL LOW (ref 13.0–17.0)
Lymphocytes Relative: 22.8 % (ref 12.0–46.0)
Lymphs Abs: 1.7 10*3/uL (ref 0.7–4.0)
MCHC: 32.1 g/dL (ref 30.0–36.0)
MCV: 76.3 fl — ABNORMAL LOW (ref 78.0–100.0)
Monocytes Absolute: 0.4 10*3/uL (ref 0.1–1.0)
Monocytes Relative: 5.5 % (ref 3.0–12.0)
Neutro Abs: 5.3 10*3/uL (ref 1.4–7.7)
Neutrophils Relative %: 68.7 % (ref 43.0–77.0)
Platelets: 262 10*3/uL (ref 150.0–400.0)
RBC: 5.18 Mil/uL (ref 4.22–5.81)
RDW: 17.7 % — ABNORMAL HIGH (ref 11.5–15.5)
WBC: 7.7 10*3/uL (ref 4.0–10.5)

## 2020-07-07 LAB — BASIC METABOLIC PANEL
BUN: 14 mg/dL (ref 6–23)
CO2: 33 mEq/L — ABNORMAL HIGH (ref 19–32)
Calcium: 9.2 mg/dL (ref 8.4–10.5)
Chloride: 105 mEq/L (ref 96–112)
Creatinine, Ser: 1.2 mg/dL (ref 0.40–1.50)
GFR: 58.72 mL/min — ABNORMAL LOW (ref >60.00)
Glucose, Bld: 89 mg/dL (ref 70–99)
Potassium: 3.5 mEq/L (ref 3.5–5.1)
Sodium: 145 mEq/L (ref 135–145)

## 2020-07-07 LAB — URINALYSIS, ROUTINE W REFLEX MICROSCOPIC
Bilirubin Urine: NEGATIVE
Hgb urine dipstick: NEGATIVE
Ketones, ur: NEGATIVE
Leukocytes,Ua: NEGATIVE
Nitrite: NEGATIVE
RBC / HPF: NONE SEEN (ref 0–?)
Specific Gravity, Urine: 1.01 (ref 1.000–1.030)
Total Protein, Urine: NEGATIVE
Urine Glucose: NEGATIVE
Urobilinogen, UA: 0.2 (ref 0.0–1.0)
pH: 6 (ref 5.0–8.0)

## 2020-07-07 LAB — LIPID PANEL
Cholesterol: 116 mg/dL (ref 0–200)
HDL: 37 mg/dL — ABNORMAL LOW (ref >39.00)
LDL Cholesterol: 64 mg/dL (ref 0–99)
NonHDL: 79.01
Total CHOL/HDL Ratio: 3
Triglycerides: 75 mg/dL (ref 0.0–149.0)
VLDL: 15 mg/dL (ref 0.0–40.0)

## 2020-07-07 LAB — VITAMIN D 25 HYDROXY (VIT D DEFICIENCY, FRACTURES): VITD: 35.91 ng/mL (ref 30.00–100.00)

## 2020-07-07 LAB — HEMOGLOBIN A1C: Hgb A1c MFr Bld: 5.9 % (ref 4.6–6.5)

## 2020-07-07 LAB — TSH: TSH: 1.12 u[IU]/mL (ref 0.35–4.50)

## 2020-07-07 LAB — MAGNESIUM: Magnesium: 1.8 mg/dL (ref 1.5–2.5)

## 2020-07-07 MED ORDER — BENAZEPRIL HCL 40 MG PO TABS: 40.0000 mg | ORAL_TABLET | Freq: Every day | ORAL | 3 refills | Status: DC

## 2020-07-07 MED ORDER — ATORVASTATIN CALCIUM 40 MG PO TABS: 1.0000 | ORAL_TABLET | Freq: Every day | ORAL | 3 refills | Status: DC

## 2020-07-07 MED ORDER — AMLODIPINE BESYLATE 10 MG PO TABS: ORAL_TABLET | ORAL | 3 refills | Status: DC

## 2020-07-07 MED ORDER — METOPROLOL SUCCINATE ER 100 MG PO TB24: 100.0000 mg | ORAL_TABLET | Freq: Every day | ORAL | 3 refills | Status: DC

## 2020-07-07 MED ORDER — POTASSIUM CHLORIDE ER 10 MEQ PO TBCR: 30.0000 meq | EXTENDED_RELEASE_TABLET | Freq: Every day | ORAL | 1 refills | Status: DC

## 2020-07-07 MED ORDER — HYDROCHLOROTHIAZIDE 25 MG PO TABS: ORAL_TABLET | ORAL | 3 refills | Status: DC

## 2020-07-07 NOTE — Patient Instructions (Signed)
Ok to restart the toprol xl 100 mg per day  Please continue all other medications as before, and refills have been done if requested.  Please have the pharmacy call with any other refills you may need.  Please continue your efforts at being more active, low cholesterol diet, and weight control.  You are otherwise up to date with prevention measures today.  Please keep your appointments with your specialists as you may have planned  Please go to the LAB at the blood drawing area for the tests to be done  You will be contacted by phone if any changes need to be made immediately.  Otherwise, you will receive a letter about your results with an explanation, but please check with MyChart first.  Please remember to sign up for MyChart if you have not done so, as this will be important to you in the future with finding out test results, communicating by private email, and scheduling acute appointments online when needed.  Please make an Appointment to return in 6 months, or sooner if needed

## 2020-07-07 NOTE — Progress Notes (Signed)
Patient ID: Aaron Moss, male   DOB: [birth year], 75 y.o.   MRN: [id number]         Chief Complaint:: wellness exam and Medication Refill  , low vit d, htn, dm       HPI:  Aaron Moss is a 75 y.o. male here for wellness exam; up to date with preventive referrals and immunizations                        Also taking vit d.  Not taking toprol xl for some reason, BP remains elevated. Pt denies chest pain, increased sob or doe, wheezing, orthopnea, PND, increased LE swelling, palpitations, dizziness or syncope.   Pt denies polydipsia, polyuria, Denies focal neuro s/s.   Pt denies fever, wt loss, night sweats, loss of appetite, or other constitutional symptoms  No other new complaints   Wt Readings from Last 3 Encounters:  07/07/20 249 lb (112.9 kg)  06/21/20 247 lb 12.8 oz (112.4 kg)  12/23/19 256 lb (116.1 kg)   BP Readings from Last 3 Encounters:  07/07/20 (!) 160/78  06/21/20 (!) 180/78  12/23/19 137/76   Immunization History  Administered Date(s) Administered  . Influenza Whole 04/30/2009, 04/25/2010  . Influenza, High Dose Seasonal PF 12/05/2016, 02/17/2018, 12/12/2018  . Influenza,inj,Quad PF,6+ Mos 03/05/2013, 04/27/2014, 11/30/2015  . Influenza-Unspecified 11/19/2014  . PFIZER(Purple Top)SARS-COV-2 Vaccination 04/10/2019, 04/28/2019  . Pneumococcal Conjugate-13 03/26/2013  . Pneumococcal Polysaccharide-23 06/08/2009  . Td 03/21/2003  . Tdap 05/28/2015, 06/21/2020  . Zoster 02/19/2006  There are no preventive care reminders to display for this patient.    Past Medical History:  Diagnosis Date  . Colon cancer ([place]) 2000  . Diverticulosis of colon   . History of chemotherapy 2000   colon cancer  . History of colon cancer   . History of radiation therapy 08/06/13- 10/03/13   prostate 7800 cGy 40 sessions, seminal vesicles 5600 cGy 40 sessions  . Hyperlipidemia   . Hypertension   . Left knee DJD   . 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 COLON SURGERY  2000   colon cancer  . COLONOSCOPY    . EUS     pancreatic cyst  . KNEE ARTHROSCOPY  2007   [facility]- [facility]  . PROSTATE BIOPSY  05/23/13   gleason 7, volume 11.5 ml    reports that he quit smoking about 32 years ago. His smoking use included cigarettes. He has a 22.00 pack-year smoking history. He has never used smokeless tobacco. He reports current alcohol use. He reports that he does not use drugs. family history includes Cancer in his brother, maternal grandmother, and mother; Colon cancer in his sister; Diabetes in his brother. Allergies  Allergen Reactions  . Pregabalin     REACTION: felt bad   Current Outpatient Medications on File Prior to Visit  Medication Sig Dispense Refill  . aspirin EC 81 MG tablet Take 81 mg by mouth daily.    . diclofenac (VOLTAREN) 75 MG EC tablet Take 1 tablet by mouth twice daily 60 tablet 2  . esomeprazole (NEXIUM) 20 MG capsule Take 1 capsule (20 mg total) by mouth daily at 12 noon. 14 capsule 0  . polyethylene glycol (MIRALAX / GLYCOLAX) 17 g packet Take 17 g by mouth daily.    . Simethicone (GAS-X PO) Take by mouth.    . traMADol (  ULTRAM) 50 MG tablet TAKE 1 TABLET BY MOUTH EVERY 6 HOURS AS NEEDED FOR PAIN 120 tablet 2  . Travoprost, BAK Free, (TRAVATAN) 0.004 % SOLN ophthalmic solution Place 1 drop into both eyes at bedtime.     No current facility-administered medications on file prior to visit.        ROS:  All others reviewed and negative.  Objective        PE:  BP (!) 160/78 (BP Location: Left Arm, Patient Position: Sitting, Cuff Size: Large)   Pulse 74   Temp 98.6 F (37 C) (Oral)   Ht 5\' 6"  (1.676 m)   Wt 249 lb (112.9 kg)   SpO2 99%   BMI 40.19 kg/m                 Constitutional: Pt appears in NAD               HENT: Head: NCAT.                Right Ear: External ear normal.                 Left Ear: External ear normal.                Eyes: . Pupils are  equal, round, and reactive to light. Conjunctivae and EOM are normal               Nose: without d/c or deformity               Neck: Neck supple. Gross normal ROM               Cardiovascular: Normal rate and regular rhythm.                 Pulmonary/Chest: Effort normal and breath sounds without rales or wheezing.                Abd:  Soft, NT, ND, + BS, no organomegaly               Neurological: Pt is alert. At baseline orientation, motor grossly intact               Skin: Skin is warm. No rashes, no other new lesions, LE edema - none               Psychiatric: Pt behavior is normal without agitation   Micro: none  Cardiac tracings I have personally interpreted today:  none  Pertinent Radiological findings (summarize): none   Lab Results  Component Value Date   WBC 7.7 07/07/2020   HGB 12.7 (L) 07/07/2020   HCT 39.5 07/07/2020   PLT 262.0 07/07/2020   GLUCOSE 89 07/07/2020   CHOL 116 07/07/2020   TRIG 75.0 07/07/2020   HDL 37.00 (L) 07/07/2020   LDLDIRECT 185.6 05/15/2007   LDLCALC 64 07/07/2020   ALT 12 07/07/2020   AST 13 07/07/2020   NA 145 07/07/2020   K 3.5 07/07/2020   CL 105 07/07/2020   CREATININE 1.20 07/07/2020   BUN 14 07/07/2020   CO2 33 (H) 07/07/2020   TSH 1.12 07/07/2020   PSA 0.08 (L) 07/07/2020   HGBA1C 5.9 07/07/2020   Assessment/Plan:  Aaron Moss is a 75 y.o. Black or African American [2] male with  has a past medical history of Colon cancer ([place]) (2000), Diverticulosis of colon, History of chemotherapy (2000), History of colon cancer, History of radiation therapy (08/06/13- 10/03/13), Hyperlipidemia,  Hypertension, Left knee DJD, and Prostate cancer ([place]) (05/23/13).  Encounter for well adult exam with abnormal findings Age and sex appropriate education and counseling updated with regular exercise and diet Referrals for preventative services - none needed Immunizations addressed - none needed Smoking counseling  - none needed Evidence for  depression or other mood disorder - none significant Most recent labs reviewed. I have personally reviewed and have noted: 1) the patient's medical and social history 2) The patient's current medications and supplements 3) The patient's height, weight, and BMI have been recorded in the chart   Malignant neoplasm of prostate Also for f/u psa and f/u urology as planned  Hypokalemia For f/u bmp,  to f/u any worsening symptoms or concerns  Hyperlipidemia Lab Results  Component Value Date   [place] 64 07/07/2020   Stable, pt to continue current statin lipitor   Hyperglycemia Lab Results  Component Value Date   HGBA1C 5.9 07/07/2020   Stable, pt to continue current medical treatment  - diet   Hypertension, uncontrolled Uncontrolled, to restart toprol xl  BP Readings from Last 3 Encounters:  07/07/20 (!) 160/78  06/21/20 (!) 180/78  12/23/19 137/76   Cont to f/u bp at home and next visit  Vitamin D deficiency Last vitamin D Lab Results  Component Value Date   VD25OH 35.91 07/07/2020   Stable, cont oral replacement  Followup: Return in about 6 months (around 01/06/2021).  Cathlean Cower, MD 07/11/2020 6:51 PM [facility]

## 2020-07-08 ENCOUNTER — Encounter: Payer: Self-pay | Admitting: Internal Medicine

## 2020-07-11 ENCOUNTER — Encounter: Payer: Self-pay | Admitting: Internal Medicine

## 2020-07-11 NOTE — Assessment & Plan Note (Signed)
Lab Results  Component Value Date   LDLCALC 64 07/07/2020   Stable, pt to continue current statin lipitor

## 2020-07-11 NOTE — Assessment & Plan Note (Signed)

## 2020-07-11 NOTE — Assessment & Plan Note (Signed)
Uncontrolled, to restart toprol xl  BP Readings from Last 3 Encounters:  07/07/20 (!) 160/78  06/21/20 (!) 180/78  12/23/19 137/76   Cont to f/u bp at home and next visit

## 2020-07-11 NOTE — Assessment & Plan Note (Signed)
Last vitamin D Lab Results  Component Value Date   VD25OH 35.91 07/07/2020   Stable, cont oral replacement

## 2020-07-11 NOTE — Assessment & Plan Note (Signed)
For f/u bmp,  to f/u any worsening symptoms or concerns 

## 2020-07-11 NOTE — Assessment & Plan Note (Signed)
Lab Results  Component Value Date   HGBA1C 5.9 07/07/2020   Stable, pt to continue current medical treatment  - diet

## 2020-07-11 NOTE — Assessment & Plan Note (Signed)
Also for f/u psa and f/u urology as planned

## 2020-08-11 ENCOUNTER — Ambulatory Visit (INDEPENDENT_AMBULATORY_CARE_PROVIDER_SITE_OTHER): Payer: Medicare Other

## 2020-08-11 ENCOUNTER — Other Ambulatory Visit: Payer: Self-pay

## 2020-08-11 VITALS — BP 128/70 | HR 71 | Temp 98.0°F | Ht 66.0 in | Wt 249.4 lb

## 2020-08-11 DIAGNOSIS — Z0000 Encounter for general adult medical examination without abnormal findings: Secondary | ICD-10-CM

## 2020-08-11 NOTE — Patient Instructions (Signed)
[name] , Thank you for taking time to come for your Medicare Wellness Visit. I appreciate your ongoing commitment to your health goals. Please review the following plan we discussed and let me know if I can assist you in the future.   Screening recommendations/referrals: Colonoscopy: 01/10/2018; due every 5 years Recommended yearly ophthalmology/optometry visit for glaucoma screening and checkup Recommended yearly dental visit for hygiene and checkup  Vaccinations: Influenza vaccine: 12/26/2019 Pneumococcal vaccine: 06/08/2009, 03/26/2013 Tdap vaccine: 06/21/2020; due every 10 years Shingles vaccine: Please call your insurance company to determine your out of pocket expense for the Shingrix vaccine. You may receive this vaccine at your local pharmacy. Covid-19: 04/10/2019, 04/28/2019, 12/26/2019  Advanced directives: Please bring a copy of your health care power of attorney and living will to the office at your convenience.  Conditions/risks identified: Yes; Reviewed health maintenance screenings with patient today and relevant education, vaccines, and/or referrals were provided. Please continue to do your personal lifestyle choices by: daily care of teeth and gums, regular physical activity (goal should be 5 days a week for 30 minutes), eat a healthy diet, avoid tobacco and drug use, limiting any alcohol intake, taking a low-dose aspirin (if not allergic or have been advised by your provider otherwise) and taking vitamins and minerals as recommended by your provider. Continue doing brain stimulating activities (puzzles, reading, adult coloring books, staying active) to keep memory sharp. Continue to eat heart healthy diet (full of fruits, vegetables, whole grains, lean protein, water--limit salt, fat, and sugar intake) and increase physical activity as tolerated.  Next appointment: Please schedule your next Medicare Wellness Visit with your Nurse Health Advisor in 1 year by calling  [phone].  Preventive Care 1 Years and Older, Male Preventive care refers to lifestyle choices and visits with your health care provider that can promote health and wellness. What does preventive care include?  A yearly physical exam. This is also called an annual well check.  Dental exams once or twice a year.  Routine eye exams. Ask your health care provider how often you should have your eyes checked.  Personal lifestyle choices, including:  Daily care of your teeth and gums.  Regular physical activity.  Eating a healthy diet.  Avoiding tobacco and drug use.  Limiting alcohol use.  Practicing safe sex.  Taking low doses of aspirin every day.  Taking vitamin and mineral supplements as recommended by your health care provider. What happens during an annual well check? The services and screenings done by your health care provider during your annual well check will depend on your age, overall health, lifestyle risk factors, and family history of disease. Counseling  Your health care provider may ask you questions about your:  Alcohol use.  Tobacco use.  Drug use.  Emotional well-being.  Home and relationship well-being.  Sexual activity.  Eating habits.  History of falls.  Memory and ability to understand (cognition).  Work and work Statistician. Screening  You may have the following tests or measurements:  Height, weight, and BMI.  Blood pressure.  Lipid and cholesterol levels. These may be checked every 5 years, or more frequently if you are over 31 years old.  Skin check.  Lung cancer screening. You may have this screening every year starting at age 5 if you have a 30-pack-year history of smoking and currently smoke or have quit within the past 15 years.  Fecal occult blood test (FOBT) of the stool. You may have this test every year starting at age  50.  Flexible sigmoidoscopy or colonoscopy. You may have a sigmoidoscopy every 5 years or a  colonoscopy every 10 years starting at age 41.  Prostate cancer screening. Recommendations will vary depending on your family history and other risks.  Hepatitis C blood test.  Hepatitis B blood test.  Sexually transmitted disease (STD) testing.  Diabetes screening. This is done by checking your blood sugar (glucose) after you have not eaten for a while (fasting). You may have this done every 1-3 years.  Abdominal aortic aneurysm (AAA) screening. You may need this if you are a current or former smoker.  Osteoporosis. You may be screened starting at age 60 if you are at high risk. Talk with your health care provider about your test results, treatment options, and if necessary, the need for more tests. Vaccines  Your health care provider may recommend certain vaccines, such as:  Influenza vaccine. This is recommended every year.  Tetanus, diphtheria, and acellular pertussis (Tdap, Td) vaccine. You may need a Td booster every 10 years.  Zoster vaccine. You may need this after age 60.  Pneumococcal 13-valent conjugate (PCV13) vaccine. One dose is recommended after age 42.  Pneumococcal polysaccharide (PPSV23) vaccine. One dose is recommended after age 61. Talk to your health care provider about which screenings and vaccines you need and how often you need them. This information is not intended to replace advice given to you by your health care provider. Make sure you discuss any questions you have with your health care provider. Document Released: 04/02/2015 Document Revised: 11/24/2015 Document Reviewed: 01/05/2015 Elsevier Interactive Patient Education  2017 [place] Prevention in the Home Falls can cause injuries. They can happen to people of all ages. There are many things you can do to make your home safe and to help prevent falls. What can I do on the outside of my home?  Regularly fix the edges of walkways and driveways and fix any cracks.  Remove anything that  might make you trip as you walk through a door, such as a raised step or threshold.  Trim any bushes or trees on the path to your home.  Use bright outdoor lighting.  Clear any walking paths of anything that might make someone trip, such as rocks or tools.  Regularly check to see if handrails are loose or broken. Make sure that both sides of any steps have handrails.  Any raised decks and porches should have guardrails on the edges.  Have any leaves, snow, or ice cleared regularly.  Use sand or salt on walking paths during winter.  Clean up any spills in your garage right away. This includes oil or grease spills. What can I do in the bathroom?  Use night lights.  Install grab bars by the toilet and in the tub and shower. Do not use towel bars as grab bars.  Use non-skid mats or decals in the tub or shower.  If you need to sit down in the shower, use a plastic, non-slip stool.  Keep the floor dry. Clean up any water that spills on the floor as soon as it happens.  Remove soap buildup in the tub or shower regularly.  Attach bath mats securely with double-sided non-slip rug tape.  Do not have throw rugs and other things on the floor that can make you trip. What can I do in the bedroom?  Use night lights.  Make sure that you have a light by your bed that is easy to reach.  Do not use any sheets or blankets that are too big for your bed. They should not hang down onto the floor.  Have a firm chair that has side arms. You can use this for support while you get dressed.  Do not have throw rugs and other things on the floor that can make you trip. What can I do in the kitchen?  Clean up any spills right away.  Avoid walking on wet floors.  Keep items that you use a lot in easy-to-reach places.  If you need to reach something above you, use a strong step stool that has a grab bar.  Keep electrical cords out of the way.  Do not use floor polish or wax that makes floors  slippery. If you must use wax, use non-skid floor wax.  Do not have throw rugs and other things on the floor that can make you trip. What can I do with my stairs?  Do not leave any items on the stairs.  Make sure that there are handrails on both sides of the stairs and use them. Fix handrails that are broken or loose. Make sure that handrails are as long as the stairways.  Check any carpeting to make sure that it is firmly attached to the stairs. Fix any carpet that is loose or worn.  Avoid having throw rugs at the top or bottom of the stairs. If you do have throw rugs, attach them to the floor with carpet tape.  Make sure that you have a light switch at the top of the stairs and the bottom of the stairs. If you do not have them, ask someone to add them for you. What else can I do to help prevent falls?  Wear shoes that:  Do not have high heels.  Have rubber bottoms.  Are comfortable and fit you well.  Are closed at the toe. Do not wear sandals.  If you use a stepladder:  Make sure that it is fully opened. Do not climb a closed stepladder.  Make sure that both sides of the stepladder are locked into place.  Ask someone to hold it for you, if possible.  Clearly mark and make sure that you can see:  Any grab bars or handrails.  First and last steps.  Where the edge of each step is.  Use tools that help you move around (mobility aids) if they are needed. These include:  Canes.  Walkers.  Scooters.  Crutches.  Turn on the lights when you go into a dark area. Replace any light bulbs as soon as they burn out.  Set up your furniture so you have a clear path. Avoid moving your furniture around.  If any of your floors are uneven, fix them.  If there are any pets around you, be aware of where they are.  Review your medicines with your doctor. Some medicines can make you feel dizzy. This can increase your chance of falling. Ask your doctor what other things that you  can do to help prevent falls. This information is not intended to replace advice given to you by your health care provider. Make sure you discuss any questions you have with your health care provider. Document Released: 12/31/2008 Document Revised: 08/12/2015 Document Reviewed: 04/10/2014 Elsevier Interactive Patient Education  2017 [employer].

## 2020-08-11 NOTE — Progress Notes (Signed)
Subjective:   Aaron Moss is a 75 y.o. male who presents for Medicare Annual/Subsequent preventive examination.  Review of Systems    No ROS. Medicare Wellness Visit. Additional risk factors are reflected in social history. Cardiac Risk Factors include: advanced age (>59men, >54 women);dyslipidemia;family history of premature cardiovascular disease;hypertension;male gender;obesity (BMI >30kg/m2)     Objective:    Today's Vitals   08/11/20 1025  BP: 128/70  Pulse: 71  Temp: 98 F (36.7 C)  SpO2: 95%  Weight: 249 lb 6.4 oz (113.1 kg)  Height: 5\' 6"  (1.676 m)  PainSc: 0-No pain   Body mass index is 40.25 kg/m.  Advanced Directives 08/11/2020 07/18/2019 02/24/2019 02/04/2019 05/03/2018 03/23/2015 02/05/2015  Does Patient Have a Medical Advance Directive? Yes Yes No No No No Yes  Type of Advance Directive Living will;Healthcare Power of [place]  Does patient want to make changes to medical advance directive? No - Patient declined No - Patient declined - - - - -  Copy of [place] in Chart? No - copy requested No - copy requested - - - - Yes  Would patient like information on creating a medical advance directive? - - No - Patient declined No - Patient declined No - Patient declined No - patient declined information -    Current Medications (verified) Outpatient Encounter Medications as of 08/11/2020  Medication Sig  . amLODipine (NORVASC) 10 MG tablet TAKE 1 TABLET BY MOUTH ONCE DAILY  . aspirin EC 81 MG tablet Take 81 mg by mouth daily.  Marland Kitchen atorvastatin (LIPITOR) 40 MG tablet Take 1 tablet (40 mg total) by mouth daily.  . benazepril (LOTENSIN) 40 MG tablet Take 1 tablet (40 mg total) by mouth daily.  . diclofenac (VOLTAREN) 75 MG EC tablet Take 1 tablet by mouth twice daily  . esomeprazole (NEXIUM) 20 MG capsule Take 1 capsule (20 mg total) by mouth daily at 12 noon.  . hydrochlorothiazide (HYDRODIURIL) 25 MG tablet  TAKE 1 TABLET BY MOUTH ONCE DAILY.  . metoprolol succinate (TOPROL-XL) 100 MG 24 hr tablet Take 1 tablet (100 mg total) by mouth daily. Take with or immediately following a meal.  . polyethylene glycol (MIRALAX / GLYCOLAX) 17 g packet Take 17 g by mouth daily.  . potassium chloride (KLOR-CON) 10 MEQ tablet Take 3 tablets (30 mEq total) by mouth daily.  . Simethicone (GAS-X PO) Take by mouth.  . traMADol (ULTRAM) 50 MG tablet TAKE 1 TABLET BY MOUTH EVERY 6 HOURS AS NEEDED FOR PAIN  . Travoprost, BAK Free, (TRAVATAN) 0.004 % SOLN ophthalmic solution Place 1 drop into both eyes at bedtime.   No facility-administered encounter medications on file as of 08/11/2020.    Allergies (verified) Pregabalin   History: Past Medical History:  Diagnosis Date  . Colon cancer ([place]) 2000  . Diverticulosis of colon   . History of chemotherapy 2000   colon cancer  . History of colon cancer   . History of radiation therapy 08/06/13- 10/03/13   prostate 7800 cGy 40 sessions, seminal vesicles 5600 cGy 40 sessions  . Hyperlipidemia   . Hypertension   . Left knee DJD   . 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 COLON SURGERY  2000   colon cancer  . COLONOSCOPY    . EUS     pancreatic cyst  . KNEE  ARTHROSCOPY  2007   LT- [facility]  . PROSTATE BIOPSY  05/23/13   gleason 7, volume 11.5 ml   Family History  Problem Relation Age of Onset  . Cancer Mother        Liver  . Cancer Brother        Lung  . Diabetes Brother   . Colon cancer Sister   . Cancer Maternal Grandmother        Breast  . Esophageal cancer Neg Hx   . Rectal cancer Neg Hx   . Stomach cancer Neg Hx    Social History   Socioeconomic History  . Marital status: Married    Spouse name: Not on file  . Number of children: Not on file  . Years of education: Not on file  . Highest education level: Not on file  Occupational History  . Not on file  Tobacco  Use  . Smoking status: Former Smoker    Packs/day: 1.00    Years: 22.00    Pack years: 22.00    Types: Cigarettes    Quit date: 03/20/1988    Years since quitting: 32.4  . Smokeless tobacco: Never Used  Vaping Use  . Vaping Use: Never used  Substance and Sexual Activity  . Alcohol use: Yes    Alcohol/week: 0.0 standard drinks    Comment: rare  . Drug use: No  . Sexual activity: Not Currently  Other Topics Concern  . Not on file  Social History Narrative  . Not on file   Social Determinants of Health   Financial Resource Strain: Low Risk   . Difficulty of Paying Living Expenses: Not hard at all  Food Insecurity: No Food Insecurity  . Worried About Charity fundraiser in the Last Year: Never true  . Ran Out of Food in the Last Year: Never true  Transportation Needs: No Transportation Needs  . Lack of Transportation (Medical): No  . Lack of Transportation (Non-Medical): No  Physical Activity: Sufficiently Active  . Days of Exercise per Week: 3 days  . Minutes of Exercise per Session: 60 min  Stress: No Stress Concern Present  . Feeling of Stress : Not at all  Social Connections: Socially Integrated  . Frequency of Communication with Friends and Family: More than three times a week  . Frequency of Social Gatherings with Friends and Family: More than three times a week  . Attends Religious Services: 1 to 4 times per year  . Active Member of Clubs or Organizations: Yes  . Attends Archivist Meetings: 1 to 4 times per year  . Marital Status: Married    Tobacco Counseling Counseling given: Not Answered   Clinical Intake:  Pre-visit preparation completed: No  Pain : No/denies pain Pain Score: 0-No pain     BMI - recorded: 40.25 Nutritional Status: BMI > 30  Obese Nutritional Risks: None Diabetes: No  How often do you need to have someone help you when you read instructions, pamphlets, or other written materials from your doctor or pharmacy?: 1 -  Never What is the last grade level you completed in school?: HSG  Diabetic? no  Interpreter Needed?: No  Information entered by :: [employer], LPN.   Activities of Daily Living In your present state of health, do you have any difficulty performing the following activities: 08/11/2020  Hearing? N  Vision? N  Difficulty concentrating or making decisions? N  Walking or climbing stairs? N  Dressing or bathing? N  Doing errands, shopping? N  Preparing Food and eating ? N  Using the Toilet? N  In the past six months, have you accidently leaked urine? N  Do you have problems with loss of bowel control? N  Managing your Medications? N  Managing your Finances? N  Housekeeping or managing your Housekeeping? N  Some recent data might be hidden    Patient Care Team: Biagio Borg, MD as PCP - General Carlean Purl Ofilia Neas, MD as Consulting Physician (Gastroenterology) [name], MD as Consulting Physician (Orthopedic Surgery) [name], MD as Consulting Physician (Ophthalmology)  Indicate any recent Medical Services you may have received from other than [facility] providers in the past year (date may be approximate).     Assessment:   This is a routine wellness examination for [place].  Hearing/Vision screen No exam data present  Dietary issues and exercise activities discussed: Current Exercise Habits: Structured exercise class, Type of exercise: walking;treadmill;stretching;strength training/weights, Time (Minutes): 60, Frequency (Times/Week): 3, Weekly Exercise (Minutes/Week): 180, Intensity: Moderate, Exercise limited by: None identified  Goals Addressed   None    Depression Screen PHQ 2/9 Scores 08/11/2020 07/07/2020 07/18/2019 05/06/2019 05/06/2019 05/03/2018 06/11/2017  PHQ - 2 Score 0 0 0 0 0 0 0  PHQ- 9 Score - - - - - - -    Fall Risk Fall Risk  08/11/2020 07/07/2020 07/18/2019 05/06/2019 05/06/2019  Falls in the past year? 0 0 0 0 0  Number falls in past yr: 0 - 0 - -   Injury with Fall? 0 - 0 - -  Comment - - - - -  Risk for fall due to : No Fall Risks - Orthopedic patient - -  Follow up Falls evaluation completed - Falls evaluation completed;Education provided;Falls prevention discussed - -    FALL RISK PREVENTION PERTAINING TO THE HOME:  Any stairs in or around the home? No  If so, are there any without handrails? No  Home free of loose throw rugs in walkways, pet beds, electrical cords, etc? Yes  Adequate lighting in your home to reduce risk of falls? Yes   ASSISTIVE DEVICES UTILIZED TO PREVENT FALLS:  Life alert? No  Use of a cane, walker or w/c? No  Grab bars in the bathroom? No  Shower chair or bench in shower? No  Elevated toilet seat or a handicapped toilet? Yes   TIMED UP AND GO:  Was the test performed? No .  Length of time to ambulate 10 feet: 0 sec.   Gait steady and fast without use of assistive device  Cognitive Function: Normal cognitive status assessed by direct observation by this Nurse Health Advisor. No abnormalities found.   MMSE - Mini Mental State Exam 02/05/2015  Not completed: (No Data)     6CIT Screen 07/18/2019  What Year? 0 points  What month? 0 points  What time? 0 points  Count back from 20 0 points  Months in reverse 0 points  Repeat phrase 0 points  Total Score 0    Immunizations Immunization History  Administered Date(s) Administered  . Influenza Whole 04/30/2009, 04/25/2010  . Influenza, High Dose Seasonal PF 12/05/2016, 02/17/2018, 12/12/2018  . Influenza,inj,Quad PF,6+ Mos 03/05/2013, 04/27/2014, 11/30/2015  . Influenza-Unspecified 11/19/2014, 12/26/2019  . PFIZER(Purple Top)SARS-COV-2 Vaccination 04/10/2019, 04/28/2019, 12/26/2019  . Pneumococcal Conjugate-13 03/26/2013  . Pneumococcal Polysaccharide-23 06/08/2009  . Td 03/21/2003  . Tdap 05/28/2015, 06/21/2020  . Zoster 02/19/2006    TDAP status: Up to date  Flu Vaccine status: Up to  date  Pneumococcal vaccine status: Up to  date  Covid-19 vaccine status: Completed vaccines  Qualifies for Shingles Vaccine? Yes   Zostavax completed Yes   Shingrix Completed?: No.    Education has been provided regarding the importance of this vaccine. Patient has been advised to call insurance company to determine out of pocket expense if they have not yet received this vaccine. Advised may also receive vaccine at local pharmacy or Health Dept. Verbalized acceptance and understanding.  Screening Tests Health Maintenance  Topic Date Due  . COVID-19 Vaccine (4 - Booster for Pfizer series) 03/27/2020  . INFLUENZA VACCINE  10/18/2020  . COLONOSCOPY (Pts 45-44yrs Insurance coverage will need to be confirmed)  01/11/2023  . TETANUS/TDAP  06/22/2030  . Hepatitis C Screening  Completed  . PNA vac Low Risk Adult  Completed  . HPV VACCINES  Aged Out    Health Maintenance  Health Maintenance Due  Topic Date Due  . COVID-19 Vaccine (4 - Booster for Pfizer series) 03/27/2020    Colorectal cancer screening: Type of screening: Colonoscopy. Completed 01/10/2018. Repeat every 5 years  Lung Cancer Screening: (Low Dose CT Chest recommended if Age 2-80 years, 30 pack-year currently smoking OR have quit w/in 15years.) does not qualify.   Lung Cancer Screening Referral: no  Additional Screening:  Hepatitis C Screening: does qualify; Completed yes  Vision Screening: Recommended annual ophthalmology exams for early detection of glaucoma and other disorders of the eye. Is the patient up to date with their annual eye exam?  Yes  Who is the provider or what is the name of the office in which the patient attends annual eye exams? [name], MD. If pt is not established with a provider, would they like to be referred to a provider to establish care? No .   Dental Screening: Recommended annual dental exams for proper oral hygiene  Community Resource Referral / Chronic Care Management: CRR required this visit?  No   CCM required  this visit?  No      Plan:     I have personally reviewed and noted the following in the patient's chart:   . Medical and social history . Use of alcohol, tobacco or illicit drugs  . Current medications and supplements including opioid prescriptions. Patient is not currently taking opioid prescriptions. . Functional ability and status . Nutritional status . Physical activity . Advanced directives . List of other physicians . Hospitalizations, surgeries, and ER visits in previous 12 months . Vitals . Screenings to include cognitive, depression, and falls . Referrals and appointments  In addition, I have reviewed and discussed with patient certain preventive protocols, quality metrics, and best practice recommendations. A written personalized care plan for preventive services as well as general preventive health recommendations were provided to patient.     Sheral Flow, LPN   [id number]   Nurse Notes: n/a

## 2020-11-12 ENCOUNTER — Other Ambulatory Visit: Payer: Self-pay | Admitting: Internal Medicine

## 2020-11-16 ENCOUNTER — Encounter: Payer: Self-pay | Admitting: Internal Medicine

## 2020-11-16 ENCOUNTER — Other Ambulatory Visit: Payer: Self-pay | Admitting: Internal Medicine

## 2020-11-16 ENCOUNTER — Other Ambulatory Visit: Payer: Self-pay

## 2020-11-16 ENCOUNTER — Ambulatory Visit: Payer: Medicare Other | Admitting: Internal Medicine

## 2020-11-16 VITALS — BP 162/70 | HR 61 | Temp 98.4°F | Ht 66.0 in | Wt 244.0 lb

## 2020-11-16 DIAGNOSIS — E7800 Pure hypercholesterolemia, unspecified: Secondary | ICD-10-CM

## 2020-11-16 DIAGNOSIS — D509 Iron deficiency anemia, unspecified: Secondary | ICD-10-CM

## 2020-11-16 DIAGNOSIS — D649 Anemia, unspecified: Secondary | ICD-10-CM

## 2020-11-16 DIAGNOSIS — Z23 Encounter for immunization: Secondary | ICD-10-CM | POA: Diagnosis not present

## 2020-11-16 DIAGNOSIS — R739 Hyperglycemia, unspecified: Secondary | ICD-10-CM

## 2020-11-16 DIAGNOSIS — M7989 Other specified soft tissue disorders: Secondary | ICD-10-CM | POA: Diagnosis not present

## 2020-11-16 DIAGNOSIS — I10 Essential (primary) hypertension: Secondary | ICD-10-CM | POA: Diagnosis not present

## 2020-11-16 DIAGNOSIS — E559 Vitamin D deficiency, unspecified: Secondary | ICD-10-CM

## 2020-11-16 LAB — CBC WITH DIFFERENTIAL/PLATELET
Basophils Absolute: 0 10*3/uL (ref 0.0–0.1)
Basophils Relative: 0.5 % (ref 0.0–3.0)
Eosinophils Absolute: 0.3 10*3/uL (ref 0.0–0.7)
Eosinophils Relative: 3.5 % (ref 0.0–5.0)
HCT: 36.4 % — ABNORMAL LOW (ref 39.0–52.0)
Hemoglobin: 11.6 g/dL — ABNORMAL LOW (ref 13.0–17.0)
Lymphocytes Relative: 21.1 % (ref 12.0–46.0)
Lymphs Abs: 1.7 10*3/uL (ref 0.7–4.0)
MCHC: 31.8 g/dL (ref 30.0–36.0)
MCV: 73.1 fl — ABNORMAL LOW (ref 78.0–100.0)
Monocytes Absolute: 0.6 10*3/uL (ref 0.1–1.0)
Monocytes Relative: 7.2 % (ref 3.0–12.0)
Neutro Abs: 5.5 10*3/uL (ref 1.4–7.7)
Neutrophils Relative %: 67.7 % (ref 43.0–77.0)
Platelets: 243 10*3/uL (ref 150.0–400.0)
RBC: 4.97 Mil/uL (ref 4.22–5.81)
RDW: 18.7 % — ABNORMAL HIGH (ref 11.5–15.5)
WBC: 8.1 10*3/uL (ref 4.0–10.5)

## 2020-11-16 LAB — BASIC METABOLIC PANEL
BUN: 17 mg/dL (ref 6–23)
CO2: 31 mEq/L (ref 19–32)
Calcium: 8.7 mg/dL (ref 8.4–10.5)
Chloride: 106 mEq/L (ref 96–112)
Creatinine, Ser: 1.25 mg/dL (ref 0.40–1.50)
GFR: 55.77 mL/min — ABNORMAL LOW (ref >60.00)
Glucose, Bld: 80 mg/dL (ref 70–99)
Potassium: 3.3 mEq/L — ABNORMAL LOW (ref 3.5–5.1)
Sodium: 145 mEq/L (ref 135–145)

## 2020-11-16 LAB — IBC PANEL
Iron: 26 ug/dL — ABNORMAL LOW (ref 42–165)
Saturation Ratios: 8.6 % — ABNORMAL LOW (ref 20.0–50.0)
TIBC: 303.8 ug/dL (ref 250.0–450.0)
Transferrin: 217 mg/dL (ref 212.0–360.0)

## 2020-11-16 LAB — LIPID PANEL
Cholesterol: 117 mg/dL (ref 0–200)
HDL: 31.4 mg/dL — ABNORMAL LOW (ref >39.00)
LDL Cholesterol: 67 mg/dL (ref 0–99)
NonHDL: 85.11
Total CHOL/HDL Ratio: 4
Triglycerides: 90 mg/dL (ref 0.0–149.0)
VLDL: 18 mg/dL (ref 0.0–40.0)

## 2020-11-16 LAB — HEMOGLOBIN A1C: Hgb A1c MFr Bld: 6 % (ref 4.6–6.5)

## 2020-11-16 LAB — HEPATIC FUNCTION PANEL
ALT: 101 U/L — ABNORMAL HIGH (ref 0–53)
AST: 35 U/L (ref 0–37)
Albumin: 3.4 g/dL — ABNORMAL LOW (ref 3.5–5.2)
Alkaline Phosphatase: 152 U/L — ABNORMAL HIGH (ref 39–117)
Bilirubin, Direct: 0.2 mg/dL (ref 0.0–0.3)
Total Bilirubin: 0.7 mg/dL (ref 0.2–1.2)
Total Protein: 6.2 g/dL (ref 6.0–8.3)

## 2020-11-16 LAB — VITAMIN D 25 HYDROXY (VIT D DEFICIENCY, FRACTURES): VITD: 31.96 ng/mL (ref 30.00–100.00)

## 2020-11-16 LAB — BRAIN NATRIURETIC PEPTIDE: Pro B Natriuretic peptide (BNP): 71 pg/mL (ref 0.0–100.0)

## 2020-11-16 LAB — VITAMIN B12: Vitamin B-12: 324 pg/mL (ref 211–911)

## 2020-11-16 LAB — FERRITIN: Ferritin: 12.8 ng/mL — ABNORMAL LOW (ref 22.0–322.0)

## 2020-11-16 MED ORDER — POLYSACCHARIDE IRON COMPLEX 150 MG PO CAPS: 150.0000 mg | ORAL_CAPSULE | Freq: Every day | ORAL | 1 refills | Status: DC

## 2020-11-16 NOTE — Patient Instructions (Addendum)
You had the flu shot today  Please wear compression stockings to both legs below the knees during the day only  Please continue all other medications as before, and refills have been done if requested.  Please have the pharmacy call with any other refills you may need.  Please continue your efforts at being more active, low cholesterol diet, and weight control.  Please keep your appointments with your specialists as you may have planned  Please go to the LAB at the blood drawing area for the tests to be done  You will be contacted by phone if any changes need to be made immediately.  Otherwise, you will receive a letter about your results with an explanation, but please check with MyChart first.  Please remember to sign up for MyChart if you have not done so, as this will be important to you in the future with finding out test results, communicating by private email, and scheduling acute appointments online when needed.  Please make an Appointment to return in 6 months, or sooner if needed

## 2020-11-16 NOTE — Progress Notes (Signed)
Patient ID: Aaron Moss, male   DOB: [birth year], 75 y.o.   MRN: [id number]        Chief Complaint: follow up HTN, HLD and hyperglycemia, leg swelling, ? Renal insufficiency, ? anemia       HPI:  Aaron Moss is a 75 y.o. male here with c/o 1 wk mild worsening feet and ankle swelling; has had episode of recent worsening back pain, and sitting more with legs dependent he admits, no change in medications, BP at home has been < 140/90.  Pt denies chest pain, increased sob or doe, wheezing, orthopnea, PND, palpitations, dizziness or syncope.  Last labs with slight worsening cr and lower hgb.  No overt bleeding  Denies worsening reflux, abd pain, dysphagia, n/v, bowel change or blood.   Pt denies polydipsia, polyuria, or new focal neuro s/s.   Pt denies fever, wt loss, night sweats, loss of appetite, or other constitutional symptoms   No other new complaints  Due for flu shot       Wt Readings from Last 3 Encounters:  11/16/20 244 lb (110.7 kg)  08/11/20 249 lb 6.4 oz (113.1 kg)  07/07/20 249 lb (112.9 kg)   BP Readings from Last 3 Encounters:  11/16/20 (!) 162/70  08/11/20 128/70  07/07/20 (!) 160/78         Past Medical History:  Diagnosis Date   Colon cancer ([place]) 2000   Diverticulosis of colon    History of chemotherapy 2000   colon cancer   History of colon cancer    History of radiation therapy 08/06/13- 10/03/13   prostate 7800 cGy 40 sessions, seminal vesicles 5600 cGy 40 sessions   Hyperlipidemia    Hypertension    Left knee DJD    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 His smoking use included cigarettes. He has a 22.00 pack-year smoking history. He has never used  smokeless tobacco. He reports current alcohol use. He reports that he does not use drugs. family history includes Cancer in his brother, maternal grandmother, and mother; Colon cancer in his sister; Diabetes in his brother. Allergies  Allergen Reactions   Pregabalin     REACTION: felt bad   Current Outpatient Medications on File Prior to Visit  Medication Sig Dispense Refill   amLODipine (NORVASC) 10 MG tablet TAKE 1 TABLET BY MOUTH ONCE DAILY 90 tablet 3   aspirin EC 81 MG tablet Take 81 mg by mouth daily.     atorvastatin (LIPITOR) 40 MG tablet Take 1 tablet (40 mg total) by mouth daily. 90 tablet 3   benazepril (LOTENSIN) 40 MG tablet Take 1 tablet (40 mg total) by mouth daily. 90 tablet 3   diclofenac (VOLTAREN) 75 MG EC tablet Take 1 tablet by mouth twice daily 60 tablet 2   esomeprazole (NEXIUM) 20 MG capsule Take 1 capsule (20 mg total) by mouth daily at 12 noon. 14 capsule 0   hydrochlorothiazide (HYDRODIURIL) 25 MG tablet TAKE 1 TABLET BY MOUTH ONCE DAILY. 90 tablet 3   metoprolol succinate (TOPROL-XL) 100 MG 24 hr  tablet Take 1 tablet (100 mg total) by mouth daily. Take with or immediately following a meal. 90 tablet 3   polyethylene glycol (MIRALAX / GLYCOLAX) 17 g packet Take 17 g by mouth daily.     potassium chloride (KLOR-CON) 10 MEQ tablet Take 3 tablets (30 mEq total) by mouth daily. 270 tablet 1   Simethicone (GAS-X PO) Take by mouth.     traMADol (ULTRAM) 50 MG tablet TAKE 1 TABLET BY MOUTH EVERY 6 HOURS AS NEEDED FOR PAIN 120 tablet 2   Travoprost, BAK Free, (TRAVATAN) 0.004 % SOLN ophthalmic solution Place 1 drop into both eyes at bedtime.     No current facility-administered medications on file prior to visit.        ROS:  All others reviewed and negative.  Objective        PE:  BP (!) 162/70 (BP Location: Left Arm, Patient Position: Sitting, Cuff Size: Large)   Pulse 61   Temp 98.4 F (36.9 C) (Oral)   Ht '5\' 6"'$  (1.676 m)   Wt 244 lb (110.7 kg)   SpO2 98%    BMI 39.38 kg/m                 Constitutional: Pt appears in NAD               HENT: Head: NCAT.                Right Ear: External ear normal.                 Left Ear: External ear normal.                Eyes: . Pupils are equal, round, and reactive to light. Conjunctivae and EOM are normal               Nose: without d/c or deformity               Neck: Neck supple. Gross normal ROM               Cardiovascular: Normal rate and regular rhythm.                 Pulmonary/Chest: Effort normal and breath sounds without rales or wheezing.                Abd:  Soft, NT, ND, + BS, no organomegaly               Neurological: Pt is alert. At baseline orientation, motor grossly intact               Skin: Skin is warm. No rashes, no other new lesions, LE edema - none               Psychiatric: Pt behavior is normal without agitation   Micro: none  Cardiac tracings I have personally interpreted today:  none  Pertinent Radiological findings (summarize): none   Lab Results  Component Value Date   WBC 7.7 07/07/2020   HGB 12.7 (L) 07/07/2020   HCT 39.5 07/07/2020   PLT 262.0 07/07/2020   GLUCOSE 89 07/07/2020   CHOL 116 07/07/2020   TRIG 75.0 07/07/2020   HDL 37.00 (L) 07/07/2020   LDLDIRECT 185.6 05/15/2007   LDLCALC 64 07/07/2020   ALT 12 07/07/2020   AST 13 07/07/2020   NA 145 07/07/2020   K 3.5 07/07/2020   CL 105 07/07/2020   CREATININE 1.20 07/07/2020   BUN  14 07/07/2020   CO2 33 (H) 07/07/2020   TSH 1.12 07/07/2020   PSA 0.08 (L) 07/07/2020   HGBA1C 5.9 07/07/2020   Assessment/Plan:  Aaron Moss is a 75 y.o. Black or African American [2] male with  has a past medical history of Colon cancer ([place]) (2000), Diverticulosis of colon, History of chemotherapy (2000), History of colon cancer, History of radiation therapy (08/06/13- 10/03/13), Hyperlipidemia, Hypertension, Left knee DJD, and Prostate cancer ([place]) (05/23/13).  No problem-specific Assessment & Plan notes found for  this encounter.  Followup: No follow-ups on file.  Cathlean Cower, MD 11/16/2020 11:48 AM [facility]

## 2020-11-20 NOTE — Assessment & Plan Note (Signed)
Last vitamin D Lab Results  Component Value Date   VD25OH 31.96 11/16/2020   Low, to start oral replacement

## 2020-11-20 NOTE — Assessment & Plan Note (Signed)
BP Readings from Last 3 Encounters:  11/16/20 (!) 162/70  08/11/20 128/70  07/07/20 (!) 160/78   Uncontrolled, pt states < 140/90 at home, pt to continue medical treatment amlod, toprol , hct, lotensin

## 2020-11-20 NOTE — Assessment & Plan Note (Signed)
Lab Results  Component Value Date   LDLCALC 67 11/16/2020   Stable, pt to continue current statin lipitor 40

## 2020-11-20 NOTE — Assessment & Plan Note (Signed)
Lab Results  Component Value Date   HGBA1C 6.0 11/16/2020   Stable, pt to continue current medical treatment  - diet

## 2020-11-20 NOTE — Assessment & Plan Note (Addendum)
Suspect some element of dependent edema, cont current meds including amldoipine 10 for now, check bnp

## 2020-11-20 NOTE — Assessment & Plan Note (Signed)
?   Mild new onset , for f/u lab

## 2021-01-18 DIAGNOSIS — C170 Malignant neoplasm of duodenum: Secondary | ICD-10-CM

## 2021-01-18 HISTORY — DX: Malignant neoplasm of duodenum: C17.0

## 2021-02-16 ENCOUNTER — Other Ambulatory Visit: Payer: Self-pay

## 2021-02-16 ENCOUNTER — Ambulatory Visit: Payer: Medicare Other | Admitting: Internal Medicine

## 2021-02-16 ENCOUNTER — Encounter: Payer: Self-pay | Admitting: Internal Medicine

## 2021-02-16 VITALS — BP 136/74 | HR 73 | Resp 18 | Ht 66.0 in | Wt 233.2 lb

## 2021-02-16 DIAGNOSIS — D509 Iron deficiency anemia, unspecified: Secondary | ICD-10-CM | POA: Diagnosis not present

## 2021-02-16 DIAGNOSIS — R7989 Other specified abnormal findings of blood chemistry: Secondary | ICD-10-CM | POA: Insufficient documentation

## 2021-02-16 DIAGNOSIS — R1013 Epigastric pain: Secondary | ICD-10-CM | POA: Diagnosis not present

## 2021-02-16 DIAGNOSIS — R739 Hyperglycemia, unspecified: Secondary | ICD-10-CM | POA: Diagnosis not present

## 2021-02-16 LAB — BASIC METABOLIC PANEL
BUN: 14 mg/dL (ref 6–23)
CO2: 31 mEq/L (ref 19–32)
Calcium: 9.1 mg/dL (ref 8.4–10.5)
Chloride: 108 mEq/L (ref 96–112)
Creatinine, Ser: 1.22 mg/dL (ref 0.40–1.50)
GFR: 57.32 mL/min — ABNORMAL LOW (ref >60.00)
Glucose, Bld: 81 mg/dL (ref 70–99)
Potassium: 3.2 mEq/L — ABNORMAL LOW (ref 3.5–5.1)
Sodium: 145 mEq/L (ref 135–145)

## 2021-02-16 LAB — IBC PANEL
Iron: 20 ug/dL — ABNORMAL LOW (ref 42–165)
Saturation Ratios: 5.9 % — ABNORMAL LOW (ref 20.0–50.0)
TIBC: 341.6 ug/dL (ref 250.0–450.0)
Transferrin: 244 mg/dL (ref 212.0–360.0)

## 2021-02-16 LAB — HEPATIC FUNCTION PANEL
ALT: 10 U/L (ref 0–53)
AST: 12 U/L (ref 0–37)
Albumin: 3.5 g/dL (ref 3.5–5.2)
Alkaline Phosphatase: 97 U/L (ref 39–117)
Bilirubin, Direct: 0.2 mg/dL (ref 0.0–0.3)
Total Bilirubin: 0.8 mg/dL (ref 0.2–1.2)
Total Protein: 6.5 g/dL (ref 6.0–8.3)

## 2021-02-16 LAB — CBC WITH DIFFERENTIAL/PLATELET
Basophils Absolute: 0 10*3/uL (ref 0.0–0.1)
Basophils Relative: 0.4 % (ref 0.0–3.0)
Eosinophils Absolute: 0.3 10*3/uL (ref 0.0–0.7)
Eosinophils Relative: 3.1 % (ref 0.0–5.0)
HCT: 30.9 % — ABNORMAL LOW (ref 39.0–52.0)
Hemoglobin: 9.6 g/dL — ABNORMAL LOW (ref 13.0–17.0)
Lymphocytes Relative: 20.4 % (ref 12.0–46.0)
Lymphs Abs: 1.7 10*3/uL (ref 0.7–4.0)
MCHC: 31.1 g/dL (ref 30.0–36.0)
MCV: 73.1 fl — ABNORMAL LOW (ref 78.0–100.0)
Monocytes Absolute: 0.6 10*3/uL (ref 0.1–1.0)
Monocytes Relative: 7.5 % (ref 3.0–12.0)
Neutro Abs: 5.8 10*3/uL (ref 1.4–7.7)
Neutrophils Relative %: 68.6 % (ref 43.0–77.0)
Platelets: 294 10*3/uL (ref 150.0–400.0)
RBC: 4.23 Mil/uL (ref 4.22–5.81)
RDW: 19 % — ABNORMAL HIGH (ref 11.5–15.5)
WBC: 8.5 10*3/uL (ref 4.0–10.5)

## 2021-02-16 LAB — FERRITIN: Ferritin: 5.5 ng/mL — ABNORMAL LOW (ref 22.0–322.0)

## 2021-02-16 LAB — LIPASE: Lipase: 13 U/L (ref 11.0–59.0)

## 2021-02-16 LAB — HEMOGLOBIN A1C: Hgb A1c MFr Bld: 5.4 % (ref 4.6–6.5)

## 2021-02-16 MED ORDER — SUCRALFATE 1 G PO TABS: 1.0000 g | ORAL_TABLET | Freq: Four times a day (QID) | ORAL | 0 refills | Status: DC

## 2021-02-16 MED ORDER — PANTOPRAZOLE SODIUM 40 MG PO TBEC: 40.0000 mg | DELAYED_RELEASE_TABLET | Freq: Every day | ORAL | 3 refills | Status: DC

## 2021-02-16 NOTE — Progress Notes (Signed)
Patient ID: Aaron Moss, male   DOB: [birth year], 75 y.o.   MRN: [id number]        Chief Complaint: follow up epigastric pain       HPI:  Aaron Moss is a 75 y.o. male here with c/o 2 months gradually worsening now moderate to severe at times epigastric pain; constant, ? Gas pain like but not sure; even water and most any other food can make the discomfort and nausea worse, and typically lasts up to 15 min many times per day,  no vomiting, fever or recent wt loss.  OTC nexium not helping.  Denies NSAID use or significant ETOH.  No radiation.  Wt down with intentional less calories, better diet, trying to qualify with lower wt for left knee surgury.  Has had occasional black stool but has been on iron supplement. Last labs are c/w iron deficiency anemia - new mild worsening aug 2022, but unfortuantely has not seen GI.  Did also have unexplained elevate ALT as well.  Last colonoscopy oct 2019, and EGD with EUS last done mar 2011.   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 His smoking use included cigarettes. He has a  22.00 pack-year smoking history. He has never used smokeless tobacco. He reports current alcohol use. He reports that he does not use drugs. family history includes Cancer in his brother, maternal grandmother, and mother; Colon cancer in his sister; Diabetes in his brother. Allergies  Allergen Reactions   Pregabalin     REACTION: felt bad   Current Outpatient Medications on File Prior to Visit  Medication Sig Dispense Refill   amLODipine (NORVASC) 10 MG tablet TAKE 1 TABLET BY MOUTH ONCE DAILY 90 tablet 3   aspirin EC 81 MG tablet Take 81 mg by mouth daily.     atorvastatin (LIPITOR) 40 MG tablet Take 1 tablet (40 mg total) by mouth daily. 90 tablet 3   benazepril (LOTENSIN) 40 MG tablet Take 1 tablet (40 mg total) by mouth daily. 90 tablet 3   hydrochlorothiazide (HYDRODIURIL) 25 MG tablet TAKE 1 TABLET BY MOUTH ONCE DAILY. 90 tablet 3   iron polysaccharides (NU-IRON) 150 MG capsule Take 1 capsule (150 mg total) by mouth daily. 90 capsule 1   metoprolol succinate (TOPROL-XL) 100 MG 24 hr tablet Take 1 tablet (100 mg total)  by mouth daily. Take with or immediately following a meal. 90 tablet 3   polyethylene glycol (MIRALAX / GLYCOLAX) 17 g packet Take 17 g by mouth daily.     potassium chloride (KLOR-CON) 10 MEQ tablet Take 3 tablets (30 mEq total) by mouth daily. 270 tablet 1   Simethicone (GAS-X PO) Take by mouth.     traMADol (ULTRAM) 50 MG tablet TAKE 1 TABLET BY MOUTH EVERY 6 HOURS AS NEEDED FOR PAIN 120 tablet 2   Travoprost, BAK Free, (TRAVATAN) 0.004 % SOLN ophthalmic solution Place 1 drop into both eyes at bedtime.     No current facility-administered medications on file prior to visit.        ROS:  All others reviewed and negative.  Objective        PE:  BP 136/74   Pulse 73   Resp 18   Ht 5\' 6"  (1.676 m)   Wt 233 lb 3.2 oz (105.8 kg)   SpO2 96%   BMI 37.64 kg/m                 Constitutional: Pt appears in NAD               HENT: Head: NCAT.                Right  Ear: External ear normal.                 Left Ear: External ear normal.                Eyes: . Pupils are equal, round, and reactive to light. Conjunctivae and EOM are normal               Nose: without d/c or deformity               Neck: Neck supple. Gross normal ROM               Cardiovascular: Normal rate and regular rhythm.                 Pulmonary/Chest: Effort normal and breath sounds without rales or wheezing.                Abd:  Soft, NT, ND, + BS, no organomegaly               Neurological: Pt is alert. At baseline orientation, motor grossly intact               Skin: Skin is warm. No rashes, no other new lesions, LE edema - none               Psychiatric: Pt behavior is normal without agitation   Micro: none  Cardiac tracings I have personally interpreted today:  none  Pertinent Radiological findings (summarize): none   Lab Results  Component Value Date   WBC 8.1 11/16/2020   HGB 11.6 (L) 11/16/2020   HCT 36.4 (L) 11/16/2020   PLT 243.0 11/16/2020   GLUCOSE 80 11/16/2020   CHOL 117 11/16/2020   TRIG 90.0 11/16/2020   HDL 31.40 (L) 11/16/2020   LDLDIRECT 185.6 05/15/2007   LDLCALC 67 11/16/2020   ALT 101 (H) 11/16/2020   AST 35 11/16/2020   NA 145 11/16/2020   K 3.3 (L) 11/16/2020   CL 106 11/16/2020   CREATININE 1.25 11/16/2020   BUN 17 11/16/2020   CO2 31 11/16/2020   TSH 1.12 07/07/2020   PSA 0.08 (L)  07/07/2020   HGBA1C 6.0 11/16/2020   Assessment/Plan:  Aaron Moss is a 75 y.o. Black or African American [2] male with  has a past medical history of Colon cancer ([place]) (2000), Diverticulosis of colon, History of chemotherapy (2000), History of colon cancer, History of radiation therapy (08/06/13- 10/03/13), Hyperlipidemia, Hypertension, Left knee DJD, and Prostate cancer ([place]) (05/23/13).  Hyperglycemia Lab Results  Component Value Date   HGBA1C 6.0 11/16/2020   Stable, pt to continue current medical treatment  - diet   Iron deficiency  anemia Etiology unclaear, for f/u lab, cont  Oral iron, and refer GI  Epigastric pain Etiology unclear, but possible gastritis by exam today, for change nexium 22 to protonix 40 qd, carafate qid x 1 mo, check hpylori ab, cbc with labs, and refer GI  - may need EGD  Abnormal LFTs Etiology unclear, also for f/u lab today, and abd u/s to assess liver/gb  Followup: Return in about 4 weeks (around 03/16/2021).  Cathlean Cower, MD 02/16/2021 10:16 PM [facility]

## 2021-02-16 NOTE — Assessment & Plan Note (Signed)
Etiology unclaear, for f/u lab, cont  Oral iron, and refer GI

## 2021-02-16 NOTE — Assessment & Plan Note (Signed)
Etiology unclear, but possible gastritis by exam today, for change nexium 22 to protonix 40 qd, carafate qid x 1 mo, check hpylori ab, cbc with labs, and refer GI  - may need EGD

## 2021-02-16 NOTE — Assessment & Plan Note (Signed)
Lab Results  Component Value Date   HGBA1C 6.0 11/16/2020   Stable, pt to continue current medical treatment  - diet

## 2021-02-16 NOTE — Patient Instructions (Signed)
Please take all new medication as prescribed - the protonix 40 mg per day (ok to stop the OTC nexium)  Please take all new medication as prescribed - the carafate to coat the stomach lining - for one month only  Ok to continue the iron as you have  Please continue all other medications as before  Please have the pharmacy call with any other refills you may need.  Please continue your efforts at being more active, low cholesterol diet, and weight control.  Please keep your appointments with your specialists as you may have planned  You will be contacted regarding the referral for: abdomen ultrasound, and Refer GI - Dr Carlean Purl  Please go to the LAB at the blood drawing area for the tests to be done  You will be contacted by phone if any changes need to be made immediately.  Otherwise, you will receive a letter about your results with an explanation, but please check with MyChart first.  Please remember to sign up for MyChart if you have not done so, as this will be important to you in the future with finding out test results, communicating by private email, and scheduling acute appointments online when needed.  Please make an Appointment to return in 1 months, or sooner if needed

## 2021-02-16 NOTE — Assessment & Plan Note (Signed)
Etiology unclear, also for f/u lab today, and abd u/s to assess liver/gb

## 2021-02-17 ENCOUNTER — Encounter: Payer: Self-pay | Admitting: Internal Medicine

## 2021-02-17 ENCOUNTER — Other Ambulatory Visit: Payer: Self-pay | Admitting: Internal Medicine

## 2021-02-17 MED ORDER — POLYSACCHARIDE IRON COMPLEX 150 MG PO CAPS: 150.0000 mg | ORAL_CAPSULE | Freq: Two times a day (BID) | ORAL | 1 refills | Status: DC

## 2021-02-23 ENCOUNTER — Encounter: Payer: Self-pay | Admitting: Gastroenterology

## 2021-03-01 ENCOUNTER — Ambulatory Visit
Admission: RE | Admit: 2021-03-01 | Discharge: 2021-03-01 | Disposition: A | Discharge status: Home / Self Care | Payer: Medicare Other | Source: Ambulatory Visit | Attending: Internal Medicine | Admitting: Internal Medicine

## 2021-03-01 DIAGNOSIS — D509 Iron deficiency anemia, unspecified: Secondary | ICD-10-CM

## 2021-03-01 DIAGNOSIS — R7989 Other specified abnormal findings of blood chemistry: Secondary | ICD-10-CM

## 2021-03-01 DIAGNOSIS — R1013 Epigastric pain: Secondary | ICD-10-CM

## 2021-03-02 ENCOUNTER — Encounter: Payer: Self-pay | Admitting: Internal Medicine

## 2021-03-08 ENCOUNTER — Encounter: Payer: Self-pay | Admitting: Gastroenterology

## 2021-03-08 ENCOUNTER — Ambulatory Visit: Payer: Medicare Other | Admitting: Gastroenterology

## 2021-03-08 VITALS — BP 142/60 | HR 68 | Ht 66.0 in | Wt 233.0 lb

## 2021-03-08 DIAGNOSIS — D509 Iron deficiency anemia, unspecified: Secondary | ICD-10-CM | POA: Diagnosis not present

## 2021-03-08 DIAGNOSIS — Z85038 Personal history of other malignant neoplasm of large intestine: Secondary | ICD-10-CM

## 2021-03-08 DIAGNOSIS — R634 Abnormal weight loss: Secondary | ICD-10-CM | POA: Diagnosis not present

## 2021-03-08 NOTE — Progress Notes (Signed)
03/08/2021 WIL SLAPE [id number] 12/28/1945   HISTORY OF PRESENT ILLNESS: This is a 75 year old male who is a patient of Dr. Celesta Aver.  He has a personal history of colon cancer in 2000.  Last colonoscopy 3 years ago as listed below.  No EGD in several years.  He has been referred back here on this occasion by his PCP, Dr. Jenny Reichmann, for evaluation of iron deficiency anemia.  His hemoglobin in February 2021 was 14.9 g.  Then in April 2022 was 12.7 g.  Then down to 11.6 g three months ago and now recently down to 9.6 g on 11/30.  He was placed on once daily iron supplements earlier this year for low iron levels, but his iron levels continue to decrease despite that so was recently increased to twice a day.  He denies seeing any red blood in his stools.  He says his stools are dark because of the iron.  He denies NSAID use.  She denies any nausea or vomiting.  Has regular bowel movements.  He does report some mid abdominal pain that is present on a daily basis but comes and goes throughout the day.  He notices it usually in the morning after taking pills and sometimes after eating.  This has been present for several months.  He says that it feels somewhat better recently after changing his PPI from Nexium to Protonix.  He does notice a lot of fatigue and lack of energy for the past several months.  Has lost 15 pounds since May.  Colonoscopy 12/2017:  - One diminutive polyp in the sigmoid colon, removed with a cold snare. Resected and retrieved. - One 1 to 2 mm polyp in the cecum, removed with a cold biopsy forceps. Resected and retrieved. - The examination was otherwise normal on direct and retroflexion views.  Surgical [P], sigmoid and cecum, polyp (2) - HYPERPLASTIC POLYP (ONE). - BENIGN POLYPOID COLONIC MUCOSA (ONE). - NO ADENOMATOUS CHANGE OR MALIGNANCY.   Past Medical History:  Diagnosis Date   Colon cancer ([place]) 2000   Diverticulosis of colon    GERD (gastroesophageal reflux  disease)    History of chemotherapy 2000   colon cancer   History of colon cancer    History of radiation therapy 08/06/13- 10/03/13   prostate 7800 cGy 40 sessions, seminal vesicles 5600 cGy 40 sessions   Hyperlipidemia    Hypertension    Left knee DJD    Prostate cancer ([place]) 05/23/2013   gleason 3+4=7, volume 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 His smoking use included cigarettes. He has a 22.00 pack-year smoking history. He has never used smokeless tobacco. He reports current alcohol use. He reports that he does not use drugs. family history includes Cancer in his brother, maternal grandmother, and mother; Colon cancer (age of onset: 57) in his sister; Diabetes in his brother. Allergies  Allergen Reactions   Pregabalin     REACTION: felt bad      Outpatient Encounter Medications as of 03/08/2021  Medication Sig   amLODipine (NORVASC) 10 MG tablet TAKE 1 TABLET BY MOUTH ONCE DAILY  aspirin EC 81 MG tablet Take 81 mg by mouth daily.   atorvastatin (LIPITOR) 40 MG tablet Take 1 tablet (40 mg total) by mouth daily.   benazepril (LOTENSIN) 40 MG tablet Take 1 tablet (40 mg total) by mouth daily.   hydrochlorothiazide (HYDRODIURIL) 25 MG tablet TAKE 1 TABLET BY MOUTH ONCE DAILY.   iron polysaccharides (NU-IRON) 150 MG capsule Take 1 capsule (150 mg total) by mouth 2 (two) times daily.   metoprolol succinate (TOPROL-XL) 100 MG 24 hr tablet Take 1 tablet (100 mg total) by mouth daily. Take with or immediately following a meal.   pantoprazole (PROTONIX) 40 MG tablet Take 1 tablet (40 mg total) by mouth daily.   polyethylene glycol (MIRALAX / GLYCOLAX) 17 g packet Take 17 g by mouth daily as needed.   potassium chloride (KLOR-CON) 10  MEQ tablet Take 3 tablets (30 mEq total) by mouth daily.   traMADol (ULTRAM) 50 MG tablet TAKE 1 TABLET BY MOUTH EVERY 6 HOURS AS NEEDED FOR PAIN   Travoprost, BAK Free, (TRAVATAN) 0.004 % SOLN ophthalmic solution Place 1 drop into both eyes at bedtime.   [DISCONTINUED] Simethicone (GAS-X PO) Take by mouth.   [DISCONTINUED] sucralfate (CARAFATE) 1 g tablet Take 1 tablet (1 g total) by mouth 4 (four) times daily.   No facility-administered encounter medications on file as of 03/08/2021.     REVIEW OF SYSTEMS  : All other systems reviewed and negative except where noted in the History was recently increased to twice a day.  Stools have not been hemocculted.  No red blood noted, but stools are dark because of his iron.  This is a profound difference.  He has had fatigue and loss of energy.  Because of his history we need to proceed with both EGD and colonoscopy.  Present Illness.   PHYSICAL EXAM: BP (!) 142/60    Pulse 68    Ht 5\' 6"  (1.676 m)    Wt 233 lb (105.7 kg)    SpO2 99%    BMI 37.61 kg/m  General: Well developed AA male in no acute distress Head: Normocephalic and atraumatic Eyes:  Sclerae anicteric, conjunctiva pink. Ears: Normal auditory acuity Lungs: Clear throughout to auscultation; no W/R/R. Heart: Regular rate and rhythm; no M/R/G. Abdomen: Soft, non-distended.  BS present.  Non-tender. Rectal:  Will be done at the time of colonoscopy. Musculoskeletal: Symmetrical with no gross deformities  Skin: No lesions on visible extremities Extremities: No edema  Neurological: Alert oriented x 4, grossly non-focal Psychological:  Alert and cooperative. Normal mood and affect  ASSESSMENT AND PLAN: *IDA: Hemoglobin in February 2021 was 14.9 g.  In April 2022 was 12.7 g then down to 11.6 g three months ago and now down to 9.6 g on 11/30.  Iron studies are low despite once daily iron supplementation.  This is quite a profound drop.  I think that with his history we need to proceed with  both EGD and colonoscopy.  He has had fatigue and loss of energy.  He is going to be scheduled with Dr. Loletha Carrow due to sooner availability as opposed Dr. Carlean Purl.  ? Need IV iron. *Personal history of colon cancer in 2000.  Last colonoscopy 3 years ago. *Weight loss: Down 15 pounds since May, did not question if this was intentional or not.  **The risks, benefits, and alternatives to EGD and colonoscopy were discussed with the patient and he consents to proceed.   CC:  Biagio Borg, MD

## 2021-03-08 NOTE — Patient Instructions (Signed)
You have been scheduled for an endoscopy and colonoscopy. Please follow the written instructions given to you at your visit today. Please pick up your prep supplies at the pharmacy within the next 1-3 days. If you use inhalers (even only as needed), please bring them with you on the day of your procedure.  If you are age 77 or older, your body mass index should be between 23-30. Your Body mass index is 37.61 kg/m. If this is out of the aforementioned range listed, please consider follow up with your Primary Care Provider.  If you are age 27 or younger, your body mass index should be between 19-25. Your Body mass index is 37.61 kg/m. If this is out of the aformentioned range listed, please consider follow up with your Primary Care Provider.   ________________________________________________________  The [facility] providers would like to encourage you to use [facility] to communicate with providers for non-urgent requests or questions.  Due to long hold times on the telephone, sending your provider a message by [facility] may be a faster and more efficient way to get a response.  Please allow 48 business hours for a response.  Please remember that this is for non-urgent requests.  _______________________________________________________

## 2021-03-15 ENCOUNTER — Telehealth: Payer: Self-pay | Admitting: Gastroenterology

## 2021-03-15 DIAGNOSIS — D509 Iron deficiency anemia, unspecified: Secondary | ICD-10-CM

## 2021-03-15 NOTE — Telephone Encounter (Signed)
This patient was seen by an APP on December 20 and case routed to me because I will be doing an EGD and colonoscopy on 03/18/2021.  (It is a patient of Dr. Carlean Purl, and I had first endoscopic availability)  Patient has iron deficiency anemia with a hemoglobin dropping over the last few months.  At last check, it was 9.6 on 02/16/2021 with no recheck since.  Please contact this patient today and ask him to come to our lab today or by midday tomorrow for a CBC so I can be sure his hemoglobin has not dropped to a point that it would preclude sedation for endoscopic procedures this week.  - HD

## 2021-03-15 NOTE — Progress Notes (Signed)
____________________________________________________________  Attending physician addendum:  Thank you for sending this case to me. I have reviewed the entire note and agree with the plan. (I was out of the office last week and I am reviewing this note on the first available date returning to the office today)  In addition, I will send this to nursing today to arrange a CBC for tomorrow.  His last hemoglobin was 9.6 a month ago  Wilfrid Lund, MD  ____________________________________________________________

## 2021-03-15 NOTE — Telephone Encounter (Signed)
The pt has been advised to come in today or tomorrow by midday for labs.  The order has been entered.

## 2021-03-16 ENCOUNTER — Other Ambulatory Visit (INDEPENDENT_AMBULATORY_CARE_PROVIDER_SITE_OTHER): Payer: Medicare Other

## 2021-03-16 DIAGNOSIS — D509 Iron deficiency anemia, unspecified: Secondary | ICD-10-CM | POA: Diagnosis not present

## 2021-03-16 LAB — CBC WITH DIFFERENTIAL/PLATELET
Basophils Absolute: 0 10*3/uL (ref 0.0–0.1)
Basophils Relative: 0.5 % (ref 0.0–3.0)
Eosinophils Absolute: 0.2 10*3/uL (ref 0.0–0.7)
Eosinophils Relative: 2.4 % (ref 0.0–5.0)
HCT: 33.5 % — ABNORMAL LOW (ref 39.0–52.0)
Hemoglobin: 10.6 g/dL — ABNORMAL LOW (ref 13.0–17.0)
Lymphocytes Relative: 22.2 % (ref 12.0–46.0)
Lymphs Abs: 1.8 10*3/uL (ref 0.7–4.0)
MCHC: 31.7 g/dL (ref 30.0–36.0)
MCV: 72.1 fl — ABNORMAL LOW (ref 78.0–100.0)
Monocytes Absolute: 0.5 10*3/uL (ref 0.1–1.0)
Monocytes Relative: 6.3 % (ref 3.0–12.0)
Neutro Abs: 5.6 10*3/uL (ref 1.4–7.7)
Neutrophils Relative %: 68.6 % (ref 43.0–77.0)
Platelets: 319 10*3/uL (ref 150.0–400.0)
RBC: 4.65 Mil/uL (ref 4.22–5.81)
RDW: 19 % — ABNORMAL HIGH (ref 11.5–15.5)
WBC: 8.1 10*3/uL (ref 4.0–10.5)

## 2021-03-18 ENCOUNTER — Ambulatory Visit (AMBULATORY_SURGERY_CENTER): Payer: Medicare Other | Admitting: Gastroenterology

## 2021-03-18 ENCOUNTER — Other Ambulatory Visit: Payer: Self-pay

## 2021-03-18 ENCOUNTER — Encounter: Payer: Self-pay | Admitting: Gastroenterology

## 2021-03-18 VITALS — BP 130/73 | HR 66 | Temp 98.2°F | Resp 11 | Ht 66.0 in | Wt 233.0 lb

## 2021-03-18 DIAGNOSIS — K5730 Diverticulosis of large intestine without perforation or abscess without bleeding: Secondary | ICD-10-CM

## 2021-03-18 DIAGNOSIS — Z85038 Personal history of other malignant neoplasm of large intestine: Secondary | ICD-10-CM

## 2021-03-18 DIAGNOSIS — R634 Abnormal weight loss: Secondary | ICD-10-CM | POA: Diagnosis not present

## 2021-03-18 DIAGNOSIS — K648 Other hemorrhoids: Secondary | ICD-10-CM | POA: Diagnosis not present

## 2021-03-18 DIAGNOSIS — C170 Malignant neoplasm of duodenum: Secondary | ICD-10-CM | POA: Diagnosis not present

## 2021-03-18 DIAGNOSIS — D509 Iron deficiency anemia, unspecified: Secondary | ICD-10-CM | POA: Diagnosis not present

## 2021-03-18 DIAGNOSIS — K3189 Other diseases of stomach and duodenum: Secondary | ICD-10-CM

## 2021-03-18 MED ORDER — SODIUM CHLORIDE 0.9 % IV SOLN: 500.0000 mL | Freq: Once | INTRAVENOUS | Status: DC

## 2021-03-18 NOTE — Progress Notes (Signed)
SM VS

## 2021-03-18 NOTE — Op Note (Signed)
[place] Patient Name: Aaron Moss Procedure Date: 03/18/2021 8:41 AM MRN: [id number] Endoscopist: [place]. Loletha Carrow , MD Age: 75 Referring MD:  Date of Birth: 1945/05/25 Gender: Male Account #: [id number] Procedure:                Colonoscopy Indications:              Unexplained iron deficiency anemia, Weight loss                           personal Hx CRC wth resection                           no adenomatous polyps last colonoscopy 12/2017                            Carlean Purl) Medicines:                Monitored Anesthesia Care Procedure:                Pre-Anesthesia Assessment:                           - Prior to the procedure, a History and Physical                            was performed, and patient medications and                            allergies were reviewed. The patient's tolerance of                            previous anesthesia was also reviewed. The risks                            and benefits of the procedure and the sedation                            options and risks were discussed with the patient.                            All questions were answered, and informed consent                            was obtained. Prior Anticoagulants: The patient has                            taken no previous anticoagulant or antiplatelet                            agents. ASA Grade Assessment: III - A patient with                            severe systemic disease. After reviewing the risks  and benefits, the patient was deemed in                            satisfactory condition to undergo the procedure.                           After obtaining informed consent, the colonoscope                            was passed under direct vision. Throughout the                            procedure, the patient's blood pressure, pulse, and                            oxygen saturations were monitored continuously. The                             Olympus CF-HQ190L [id number]) Colonoscope was                            introduced through the anus and advanced to the the                            terminal ileum, with identification of the                            appendiceal orifice and IC valve. The colonoscopy                            was performed without difficulty. The patient                            tolerated the procedure well. The quality of the                            bowel preparation was good. The terminal ileum,                            ileocecal valve, appendiceal orifice, and rectum                            were photographed. The bowel preparation used was                            Miralax. Scope In: 8:47:01 AM Scope Out: 9:01:04 AM Scope Withdrawal Time: 0 hours 11 minutes 28 seconds  Total Procedure Duration: 0 hours 14 minutes 3 seconds  Findings:                 The perianal and digital rectal examinations were                            normal.  Multiple diverticula were found in the entire colon.                           The terminal ileum appeared normal.                           There was evidence of a prior end-to-end                            colo-colonic anastomosis in the transverse colon.                            This was patent and was characterized by healthy                            appearing mucosa.                           Internal hemorrhoids were found. The hemorrhoids                            were small. Complications:            No immediate complications. Estimated Blood Loss:     Estimated blood loss: none. Impression:               - Diverticulosis in the entire examined colon.                           - The examined portion of the ileum was normal.                           - Patent end-to-end colo-colonic anastomosis,                            characterized by healthy appearing mucosa.                           - Internal hemorrhoids.                            - No specimens collected. Recommendation:           - Patient has a contact number available for                            emergencies. The signs and symptoms of potential                            delayed complications were discussed with the                            patient. Return to normal activities tomorrow.                            Written discharge instructions were provided to the  patient.                           - Resume previous diet.                           - Continue present medications.                           - No recommendation at this time regarding repeat                            colonoscopy.                           - See the other procedure note for documentation of                            additional recommendations. Anaid Haney L. Loletha Carrow, MD 03/18/2021 9:06:08 AM This report has been signed electronically.

## 2021-03-18 NOTE — Progress Notes (Signed)
Called to room to assist during endoscopic procedure.  Patient ID and intended procedure confirmed with present staff. Received instructions for my participation in the procedure from the performing physician.  

## 2021-03-18 NOTE — Progress Notes (Signed)
To PACU, VSS. Report to [email or web address] 

## 2021-03-18 NOTE — Progress Notes (Signed)
No changes to clinical history since GI office visit on 03/08/21.  Hgb 10.6 on 03/16/21  Loose bottom front tooth noted by anesthesia.  Patient informed of chance for dental injury  - will use soft bite block.  The patient is appropriate for an endoscopic procedure in the ambulatory setting.

## 2021-03-18 NOTE — Patient Instructions (Signed)
Discharge instructions given. Normal colonoscopy. Biopsy taken. Soft diet. Resume previous medications. YOU HAD AN ENDOSCOPIC PROCEDURE TODAY AT [facility]:   Refer to the procedure report that was given to you for any specific questions about what was found during the examination.  If the procedure report does not answer your questions, please call your gastroenterologist to clarify.  If you requested that your care partner not be given the details of your procedure findings, then the procedure report has been included in a sealed envelope for you to review at your convenience later.  YOU SHOULD EXPECT: Some feelings of bloating in the abdomen. Passage of more gas than usual.  Walking can help get rid of the air that was put into your GI tract during the procedure and reduce the bloating. If you had a lower endoscopy (such as a colonoscopy or flexible sigmoidoscopy) you may notice spotting of blood in your stool or on the toilet paper. If you underwent a bowel prep for your procedure, you may not have a normal bowel movement for a few days.  Please Note:  You might notice some irritation and congestion in your nose or some drainage.  This is from the oxygen used during your procedure.  There is no need for concern and it should clear up in a day or so.  SYMPTOMS TO REPORT IMMEDIATELY:  Following lower endoscopy (colonoscopy or flexible sigmoidoscopy):  Excessive amounts of blood in the stool  Significant tenderness or worsening of abdominal pains  Swelling of the abdomen that is new, acute  Fever of 100F or higher  Following upper endoscopy (EGD)  Vomiting of blood or coffee ground material  New chest pain or pain under the shoulder blades  Painful or persistently difficult swallowing  New shortness of breath  Fever of 100F or higher  Black, tarry-looking stools  For urgent or emergent issues, a gastroenterologist can be reached at any hour by calling [phone]  [phone]. Do not use MyChart messaging for urgent concerns.    DIET:  We do recommend a small meal at first, but then you may proceed to your regular diet.  Drink plenty of fluids but you should avoid alcoholic beverages for 24 hours.  ACTIVITY:  You should plan to take it easy for the rest of today and you should NOT DRIVE or use heavy machinery until tomorrow (because of the sedation medicines used during the test).    FOLLOW UP: Our staff will call the number listed on your records 48-72 hours following your procedure to check on you and address any questions or concerns that you may have regarding the information given to you following your procedure. If we do not reach you, we will leave a message.  We will attempt to reach you two times.  During this call, we will ask if you have developed any symptoms of COVID 19. If you develop any symptoms (ie: fever, flu-like symptoms, shortness of breath, cough etc.) before then, please call [phone].  If you test positive for Covid 19 in the 2 weeks post procedure, please call and report this information to [place].    If any biopsies were taken you will be contacted by phone or by letter within the next 1-3 weeks.  Please call us at [phone] if you have not heard about the biopsies in 3 weeks.    SIGNATURES/CONFIDENTIALITY: You and/or your care partner have signed paperwork which will be entered into your electronic medical record.  These signatures  attest to the fact that that the information above on your After Visit Summary has been reviewed and is understood.  Full responsibility of the confidentiality of this discharge information lies with you and/or your care-partner.

## 2021-03-18 NOTE — Op Note (Signed)
[place] Patient Name: Aaron Moss Procedure Date: 03/18/2021 8:41 AM MRN: [id number] Endoscopist: [place]. Loletha Carrow , MD Age: 75 Referring MD:  Date of Birth: 22-Dec-1945 Gender: Male Account #: [id number] Procedure:                Upper GI endoscopy Indications:              Unexplained iron deficiency anemia, Weight loss Medicines:                Monitored Anesthesia Care Procedure:                Pre-Anesthesia Assessment:                           - Prior to the procedure, a History and Physical                            was performed, and patient medications and                            allergies were reviewed. The patient's tolerance of                            previous anesthesia was also reviewed. The risks                            and benefits of the procedure and the sedation                            options and risks were discussed with the patient.                            All questions were answered, and informed consent                            was obtained. Prior Anticoagulants: The patient has                            taken no previous anticoagulant or antiplatelet                            agents. ASA Grade Assessment: III - A patient with                            severe systemic disease. After reviewing the risks                            and benefits, the patient was deemed in                            satisfactory condition to undergo the procedure.                           After obtaining informed consent, the endoscope was  passed under direct vision. Throughout the                            procedure, the patient's blood pressure, pulse, and                            oxygen saturations were monitored continuously. The                            GIF HQ190 #[id number] was introduced through the                            mouth, and advanced to the second part of duodenum.                            The  upper GI endoscopy was accomplished without                            difficulty. The patient tolerated the procedure                            well. Scope In: Scope Out: Findings:                 The esophagus was normal.                           A single 10 mm submucosal papule (nodule) was found                            at the incisura.                           Gastric dilatation and little to no motility seen.                           A circumferential,ulcerated mass with contact                            bleeding was found in the duodenal bulb. Biopsies                            were taken with a cold forceps for histology.                            (tissue firm). Retained pills and food fragments                            partially obscuring view. That and the mass                            precluded scope passage beyond bulb, mass seen                            extending into duodenal sweep. Complications:  No immediate complications. Estimated Blood Loss:     Estimated blood loss was minimal. Impression:               - Normal esophagus.                           - A single submucosal papule (nodule) found in the                            stomach.                           - Duodenal mass. Biopsied. Concerning for                            malignancy. Recommendation:           - Patient has a contact number available for                            emergencies. The signs and symptoms of potential                            delayed complications were discussed with the                            patient. Return to normal activities tomorrow.                            Written discharge instructions were provided to the                            patient.                           - Soft diet. (low fiber)                           - Continue present medications.                           - Await pathology results.                           - See the other  procedure note for documentation of                            additional recommendations. Kristiann Noyce L. Loletha Carrow, MD 03/18/2021 9:32:28 AM This report has been signed electronically.

## 2021-03-22 ENCOUNTER — Other Ambulatory Visit: Payer: Self-pay

## 2021-03-22 ENCOUNTER — Ambulatory Visit: Payer: Medicare Other | Admitting: Internal Medicine

## 2021-03-22 ENCOUNTER — Telehealth: Payer: Self-pay | Admitting: Internal Medicine

## 2021-03-22 DIAGNOSIS — C170 Malignant neoplasm of duodenum: Secondary | ICD-10-CM

## 2021-03-22 NOTE — Telephone Encounter (Signed)
I called the patient and explained that he has cancer in the duodenum.  He is tolerating liquids and I think soft foods.  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 Benay Spice or Burr Medico  #4 holding off on surgery referral until we see CT results  #5 I had advised protein shake supplements such as Premier protein, perhaps Ensure or boost and to avoid raw vegetables and fruits in his diet to reduce the chance of gastric outlet obstruction

## 2021-03-22 NOTE — Telephone Encounter (Signed)
Patient called returning your call, please advise. 

## 2021-03-22 NOTE — Telephone Encounter (Signed)
Orders for labs placed in Epic CT Chest Abdomen and Pelvis with contrast ordered and scheduled for 04/01/2021 at [facility] at 3:30; Pt to arrive at 3:00; Pt to be NPO 4 hours prior. Pt to Pick up two bottles of contrast prior to date of test.  Oncology referral placed to Dr. Benay Spice or Dr. Burr Medico Left Message for pt to call back

## 2021-03-23 NOTE — Telephone Encounter (Signed)
Orders for labs placed in Epic. Pt made aware and given location to have those drawn. CT Chest Abdomen and Pelvis with contrast ordered and scheduled for 04/01/2021 at [facility] [place] at 3:30; Pt to arrive at 3:00; Pt to be NPO 4 hours prior. Pt to Pick up two bottles of contrast prior to date of test. Pt made aware  Oncology referral placed to Dr. Benay Spice or Dr. Burr Medico. Pt made aware that their office will be contacting them.  Pt verbalized understanding with all questions answered.

## 2021-03-24 ENCOUNTER — Telehealth: Payer: Self-pay | Admitting: *Deleted

## 2021-03-24 ENCOUNTER — Other Ambulatory Visit (INDEPENDENT_AMBULATORY_CARE_PROVIDER_SITE_OTHER): Payer: Medicare Other

## 2021-03-24 ENCOUNTER — Telehealth: Payer: Self-pay

## 2021-03-24 DIAGNOSIS — C170 Malignant neoplasm of duodenum: Secondary | ICD-10-CM

## 2021-03-24 LAB — COMPREHENSIVE METABOLIC PANEL
ALT: 7 U/L (ref 0–53)
AST: 10 U/L (ref 0–37)
Albumin: 3.6 g/dL (ref 3.5–5.2)
Alkaline Phosphatase: 104 U/L (ref 39–117)
BUN: 17 mg/dL (ref 6–23)
CO2: 32 mEq/L (ref 19–32)
Calcium: 9.5 mg/dL (ref 8.4–10.5)
Chloride: 102 mEq/L (ref 96–112)
Creatinine, Ser: 1.19 mg/dL (ref 0.40–1.50)
GFR: 59.01 mL/min — ABNORMAL LOW (ref >60.00)
Glucose, Bld: 107 mg/dL — ABNORMAL HIGH (ref 70–99)
Potassium: 3.2 mEq/L — ABNORMAL LOW (ref 3.5–5.1)
Sodium: 142 mEq/L (ref 135–145)
Total Bilirubin: 1 mg/dL (ref 0.2–1.2)
Total Protein: 7.1 g/dL (ref 6.0–8.3)

## 2021-03-24 NOTE — Telephone Encounter (Signed)
Attempted f/u call back. No answer, left VM. 

## 2021-03-24 NOTE — Telephone Encounter (Signed)
No answer for post procedure call back. Left VM. 

## 2021-03-25 LAB — CEA: CEA: 2.4 ng/mL

## 2021-03-28 ENCOUNTER — Telehealth: Payer: Self-pay | Admitting: Gastroenterology

## 2021-03-28 NOTE — Telephone Encounter (Signed)
Patient called this morning stating he has not had a BM since 03/25/21 and wanted to know what he could do for it.  He had a procedure with Dr. Loletha Carrow on 12/30.  Please call patient and advise.  Thank you.

## 2021-03-28 NOTE — Telephone Encounter (Signed)
Called and spoke with patient. He states that his last bowel movement was 3 days ago. He states that his last stool was formed. Pt states that he deals with constipation a couple of times each year. He is on an iron supplement. He knows that iron can cause constipation. He states that he usually uses a fleet enema and that will help. He tried an enema today with no relief. I advised pt to begin taking Miralax and titrate up or down as needed. Pt will try this for a few days and will let us know if he does not see any improvement in his symptoms. He is aware that the contrast he will drink for his CT scan may cause diarrhea. Pt verbalized understanding and had no concerns at the end of the call.

## 2021-03-29 ENCOUNTER — Telehealth: Payer: Self-pay | Admitting: Nurse Practitioner

## 2021-03-29 NOTE — Telephone Encounter (Signed)
Scheduled appt per 1/3 referral. Pt is aware of appt date and time.

## 2021-03-29 NOTE — Progress Notes (Signed)
Chart review.

## 2021-03-31 ENCOUNTER — Ambulatory Visit: Payer: Medicare Other | Admitting: Internal Medicine

## 2021-04-01 ENCOUNTER — Other Ambulatory Visit: Payer: Self-pay

## 2021-04-01 ENCOUNTER — Ambulatory Visit (HOSPITAL_COMMUNITY)
Admission: RE | Admit: 2021-04-01 | Discharge: 2021-04-01 | Disposition: A | Discharge status: Home / Self Care | Payer: Medicare Other | Source: Ambulatory Visit | Attending: Internal Medicine | Admitting: Internal Medicine

## 2021-04-01 DIAGNOSIS — C170 Malignant neoplasm of duodenum: Secondary | ICD-10-CM | POA: Insufficient documentation

## 2021-04-01 MED ORDER — IOPAMIDOL (ISOVUE-300) INJECTION 61%
100.0000 mL | Freq: Once | INTRAVENOUS | Status: AC | PRN
  Administered 2021-04-01: 100 mL via INTRAVENOUS

## 2021-04-01 MED ORDER — SODIUM CHLORIDE (PF) 0.9 % IJ SOLN
INTRAMUSCULAR | Status: AC
  Filled 2021-04-01: qty 50

## 2021-04-03 NOTE — Progress Notes (Addendum)
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 Simple appearing right renal cyst. 2. Otherwise negative abdominal ultrasound   03/18/2021 Procedure   Upper Endoscopy/Colonoscopy, Dr. Loletha Carrow  Upper Endoscopy Impression: - Normal esophagus. - A single submucosal papule (nodule) found in the stomach. - Duodenal mass. Biopsied. Concerning for malignancy.  Colonoscopy Impression: - Diverticulosis in the entire examined colon. - The examined portion of the ileum was normal. - Patent end-to-end colo-colonic anastomosis, characterized by healthy appearing mucosa. - Internal hemorrhoids. - No specimens collected.   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 Circumferential wall thickening of the first portion of the duodenum, in keeping with known primary malignancy. 2. Enlarged, hypodense lymph nodes anterior to the pancreatic head and in the portacaval station, concerning for nodal metastatic disease. 3. No other evidence of metastatic disease in the chest, abdomen, or pelvis. 4. Incidental note of a fluid attenuation lesion within the proximal pancreatic body measuring 1.9 x 1.4 cm, with prominence of the pancreatic  duct distally, up to 0.6 cm. This lesion was present on remote prior examination dated 05/04/2009, and has slightly increased in size over a long period of time. This is consistent with a small IPMN and benign given indolent behavior over greater than 10 years. 5. Background of very fine centrilobular pulmonary nodules, most concentrated in the lung apices, most commonly seen in smoking-related respiratory bronchiolitis. 6. Coronary artery disease.   Aortic Atherosclerosis (ICD10-I70.0).      HISTORY OF PRESENTING ILLNESS:  Aaron Moss 76 y.o. 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 Valere Dross, and history of colon cancer s/p transverse colon resection 12/27/98 showing moderately differentiated adenocarcinoma measuring 7.5 cm extending through muscularis propria into pericolonic tissue, with clear margins and 0/23 positive LNs overall pT3N0 (M0 by CT) stage II is here because of newly diagnosed cancer of the duodenum. He was being followed for mild microcytic anemia that worsened from 11.6 on 11/16/20 to 02/16/21. He underwent colonoscopy by Dr. Loletha Carrow on 03/18/21 that showed internal hemorrhoids and prior end-to-end colonic anastomosis in the transverse colon that appeared normal. Upper endoscopy showed a bleeding ulcerated mass in the duodenum, the scope could not pass. Biopsy path showed moderate to poorly differentiated invasive adenocarcinoma. Baseline CEA on 03/24/21 was normal 2.4. Staging CT CAP 04/01/21 showed circumferential wall thickening in the first part of the duodenum c/w biopsy proven malignancy. There were enlarged hypodense LNs anterior to the pancreatic head and in the portacaval area, concerning for nodal metastasis. There was an incidental finding of fluid lesion in the proximal pancreatic body 1.9 x1.4 cm which had mildly enlarged from 05/04/2009 imaging, c/w IPMN. CT chest showed small  pulmonary nodules.   Socially, he is married over 53 years, lives with his spouse.  He is retired from the city of [place].  He has 1 adult daughter who had colon cancer in her 23s treated with surgery and adjuvant chemo, now in remission.  He denies recent alcohol use but drank heavily more than 10 years ago, a former smoker quit 25-30 years ago.  Denies other drug use.  He is independent with ADLs, was very active and went to [facility] 3 times per week until about September when he began feeling more fatigued.  Still active at home.  Family history is significant for mother with bladder cancer, MGM with breast cancer, brother with lung cancer (smoker), sister with colon cancer, and daughter with colon cancer in her 66s.  Today he presents with his spouse.  He feels okay in general, mainly gas pain and periodic right flank pain in the morning, tends to ease over the day when he becomes more active.  He takes tramadol for left knee pain but nothing for abdominal discomfort.  Denies dysphagia or odynophagia, can eat a normal diet.  His appetite is low, drinks 2 Ensure per day.  He takes oral iron, has occasional constipation and dark stool, denies overt GI bleeding.  Manages constipation with MiraLAX.  Denies fever, chills, cough, chest pain, dyspnea, leg edema.   MEDICAL HISTORY:  Past Medical History:  Diagnosis Date   Colon cancer ([place]) 2000   Diverticulosis of colon    GERD (gastroesophageal reflux disease)    Glaucoma    History of chemotherapy 2000   colon cancer   History of colon cancer    History of radiation therapy 08/06/13- 10/03/13   prostate 7800 cGy 40 sessions, seminal vesicles 5600 cGy 40 sessions   Hyperlipidemia    Hypertension    Left knee DJD    Prostate cancer ([place]) 05/23/2013   gleason 3+4=7, volume 11.5 ml    SURGICAL HISTORY: Past Surgical History:  Procedure Laterality Date   COLON SURGERY  2000   colon cancer   COLONOSCOPY     EUS     pancreatic cyst   KNEE  ARTHROSCOPY  2007   [facility]   PROSTATE BIOPSY  05/23/13   gleason 7, volume 11.5 ml    SOCIAL HISTORY: Social History   Socioeconomic History   Marital status: Married    Spouse name: Not on file   Number of children: Not on file   Years of education: Not on file   Highest education level: Not on file  Occupational History   Not on file  Tobacco Use   Smoking status: Former    Packs/day: 1.00    Years: 22.00    Pack years: 22.00    Types: Cigarettes    Quit date: 03/20/1988    Years since quitting: 33.0   Smokeless tobacco: Never  Vaping Use   Vaping Use: Never used  Substance and Sexual Activity   Alcohol use: Not Currently    Comment: rare   Drug use: No   Sexual activity: Not Currently  Other Topics Concern   Not on file  Social History Narrative   Not on file   Social Determinants of Health   Financial Resource Strain: Low Risk    Difficulty of Paying Living Expenses: Not hard at all  Food Insecurity: No Food Insecurity   Worried About Charity fundraiser in the Last Year: Never true   Ran Out of Food in the Last Year: Never true  Transportation Needs: No Transportation Needs   Lack of  Transportation (Medical): No   Lack of Transportation (Non-Medical): No  Physical Activity: Sufficiently Active   Days of Exercise per Week: 3 days   Minutes of Exercise per Session: 60 min  Stress: No Stress Concern Present   Feeling of Stress : Not at all  Social Connections: Socially Integrated   Frequency of Communication with Friends and Family: More than three times a week   Frequency of Social Gatherings with Friends and Family: More than three times a week   Attends Religious Services: 1 to 4 times per year   Active Member of [employer] or Organizations: Yes   Attends Archivist Meetings: 1 to 4 times per year   Marital Status: Married  Human resources officer Violence: Not on file    FAMILY HISTORY: Family History  Problem Relation Age of Onset   Cancer  Mother        Liver   Colon cancer Sister 49   Cancer Brother        Lung   Diabetes Brother    Cancer Maternal Grandmother        Breast   Esophageal cancer Neg Hx    Rectal cancer Neg Hx    Stomach cancer Neg Hx     ALLERGIES:  is allergic to pregabalin.  MEDICATIONS:  Current Outpatient Medications  Medication Sig Dispense Refill   amLODipine (NORVASC) 10 MG tablet TAKE 1 TABLET BY MOUTH ONCE DAILY 90 tablet 3   aspirin EC 81 MG tablet Take 81 mg by mouth daily.     atorvastatin (LIPITOR) 40 MG tablet Take 1 tablet (40 mg total) by mouth daily. 90 tablet 3   benazepril (LOTENSIN) 40 MG tablet Take 1 tablet (40 mg total) by mouth daily. 90 tablet 3   hydrochlorothiazide (HYDRODIURIL) 25 MG tablet TAKE 1 TABLET BY MOUTH ONCE DAILY. 90 tablet 3   iron polysaccharides (NU-IRON) 150 MG capsule Take 1 capsule (150 mg total) by mouth 2 (two) times daily. 180 capsule 1   metoprolol succinate (TOPROL-XL) 100 MG 24 hr tablet Take 1 tablet (100 mg total) by mouth daily. Take with or immediately following a meal. 90 tablet 3   pantoprazole (PROTONIX) 40 MG tablet Take 1 tablet (40 mg total) by mouth daily. 90 tablet 3   potassium chloride (KLOR-CON) 10 MEQ tablet Take 3 tablets (30 mEq total) by mouth daily. 270 tablet 1   traMADol (ULTRAM) 50 MG tablet TAKE 1 TABLET BY MOUTH EVERY 6 HOURS AS NEEDED FOR PAIN 120 tablet 2   Travoprost, BAK Free, (TRAVATAN) 0.004 % SOLN ophthalmic solution Place 1 drop into both eyes at bedtime.     No current facility-administered medications for this visit.    REVIEW OF SYSTEMS:   Constitutional: Denies fevers, chills or abnormal night sweats (+) fatigue (+) 20 pounds weight loss (+) decreased appetite Eyes: Denies blurriness of vision, double vision or watery eyes Ears, nose, mouth, throat, and face: Denies mucositis or sore throat Respiratory: Denies cough, dyspnea or wheezes Cardiovascular: Denies palpitation, chest discomfort or lower extremity  swelling Gastrointestinal:  Denies nausea, vomiting, diarrhea, GI bleeding, heartburn, dysphagia/odynophagia (+) constipation from oral iron (+) dark stool from oral iron (+) gas pain (+) intermittent right flank pain Skin: Denies abnormal skin rashes Lymphatics: Denies new lymphadenopathy or easy bruising Neurological:Denies numbness, tingling or new weaknesses Behavioral/Psych: Mood is stable, no new changes  All other systems were reviewed with the patient and are negative.  PHYSICAL EXAMINATION: ECOG PERFORMANCE STATUS: 1 - Symptomatic  but completely ambulatory  Vitals:   04/04/21 1050  BP: (!) 157/63  Pulse: 96  Resp: 18  Temp: 98.4 F (36.9 C)  SpO2: 100%   Filed Weights   04/04/21 1050  Weight: 224 lb 11.2 oz (101.9 kg)    GENERAL:alert, no distress and comfortable SKIN: No rash EYES: sclera clear NECK: Without mass LUNGS: clear with normal breathing effort HEART: regular rate & rhythm, no lower extremity edema ABDOMEN:abdomen soft, non-tender and normal bowel sounds PSYCH: alert & oriented x 3 with fluent speech NEURO: no focal motor/sensory deficits  LABORATORY DATA:  I have reviewed the data as listed CBC Latest Ref Rng & Units 03/16/2021 02/16/2021 11/16/2020  WBC 4.0 - 10.5 K/uL 8.1 8.5 8.1  Hemoglobin 13.0 - 17.0 g/dL 10.6(L) 9.6(L) 11.6(L)  Hematocrit 39.0 - 52.0 % 33.5(L) 30.9(L) 36.4(L)  Platelets 150.0 - 400.0 K/uL 319.0 294.0 243.0   CMP Latest Ref Rng & Units 03/24/2021 02/16/2021 11/16/2020  Glucose 70 - 99 mg/dL 107(H) 81 80  BUN 6 - 23 mg/dL $Remove'17 14 17  'reBZfRt$ Creatinine 0.40 - 1.50 mg/dL 1.19 1.22 1.25  Sodium 135 - 145 mEq/L 142 145 145  Potassium 3.5 - 5.1 mEq/L 3.2(L) 3.2(L) 3.3(L)  Chloride 96 - 112 mEq/L 102 108 106  CO2 19 - 32 mEq/L 32 31 31  Calcium 8.4 - 10.5 mg/dL 9.5 9.1 8.7  Total Protein 6.0 - 8.3 g/dL 7.1 6.5 6.2  Total Bilirubin 0.2 - 1.2 mg/dL 1.0 0.8 0.7  Alkaline Phos 39 - 117 U/L 104 97 152(H)  AST 0 - 37 U/L 10 12 35  ALT 0 - 53  U/L 7 10 101(H)    RADIOGRAPHIC STUDIES: I have personally reviewed the radiological images as listed and agreed with the findings in the report. CT CHEST ABDOMEN PELVIS W CONTRAST  Result Date: 04/02/2021 CLINICAL DATA:  Evaluate for metastatic disease, new diagnosis duodenal adenocarcinoma EXAM: CT CHEST, ABDOMEN, AND PELVIS WITH CONTRAST TECHNIQUE: Multidetector CT imaging of the chest, abdomen and pelvis was performed following the standard protocol during bolus administration of intravenous contrast. RADIATION DOSE REDUCTION: This exam was performed according to the departmental dose-optimization program which includes automated exposure control, adjustment of the mA and/or kV according to patient size and/or use of iterative reconstruction technique. CONTRAST:  125mL ISOVUE-300 IOPAMIDOL (ISOVUE-300) INJECTION 61%, additional oral enteric contrast COMPARISON:  CT chest angiogram, 03/23/2015, CT abdomen pelvis, 05/04/2009 FINDINGS: CT CHEST FINDINGS Cardiovascular: Aortic atherosclerosis. Normal heart size. Three-vessel coronary artery calcifications and/or stents. No pericardial effusion. Mediastinum/Nodes: No enlarged mediastinal, hilar, or axillary lymph nodes. Thyroid gland, trachea, and esophagus demonstrate no significant findings. Lungs/Pleura: Background of very fine centrilobular pulmonary nodules, most concentrated in the lung apices. No pleural effusion or pneumothorax. Musculoskeletal: No chest wall mass or suspicious osseous lesions identified. Incidental benign lipoma within the right teres major muscle body (series 2, image 13). CT ABDOMEN PELVIS FINDINGS Hepatobiliary: No solid liver abnormality is seen. No gallstones, gallbladder wall thickening, or biliary dilatation. Pancreas: Fluid attenuation lesion within the proximal pancreatic body measuring 1.9 x 1.4 cm, with prominence of the pancreatic duct distally, up to 0.6 cm (series 2, image 53). This lesion was present on remote prior  examination dated 05/04/2009, and has slightly increased in size over a long period of time, measuring up to 1.2 x 0.7 cm previously. Spleen: Normal in size without significant abnormality. Adrenals/Urinary Tract: Adrenal glands are unremarkable. Kidneys are normal, without renal calculi, solid lesion, or hydronephrosis. Thickening of the decompressed  urinary bladder, likely related to chronic outlet obstruction. Stomach/Bowel: Stomach is within normal limits. Circumferential wall thickening of the first portion of the duodenum (series 2, image 56). The descending and more distal portions of the duodenum are normal. Appendix appears normal. No other evidence of bowel wall thickening, distention, or inflammatory changes. Colonic diverticulosis. Vascular/Lymphatic: Aortic atherosclerosis. Enlarged, hypodense lymph node anterior to the pancreatic head measuring 2.8 x 2.2 cm (series 2, image 60). Additional enlarged, hypodense portacaval node measuring up to 4.6 x 2.6 cm (series 2, image 56). Reproductive: Prostate brachytherapy. Other: No abdominal wall hernia or abnormality. No ascites. Surgical clips in the left upper quadrant. Musculoskeletal: No acute osseous findings. IMPRESSION: 1. Circumferential wall thickening of the first portion of the duodenum, in keeping with known primary malignancy. 2. Enlarged, hypodense lymph nodes anterior to the pancreatic head and in the portacaval station, concerning for nodal metastatic disease. 3. No other evidence of metastatic disease in the chest, abdomen, or pelvis. 4. Incidental note of a fluid attenuation lesion within the proximal pancreatic body measuring 1.9 x 1.4 cm, with prominence of the pancreatic duct distally, up to 0.6 cm. This lesion was present on remote prior examination dated 05/04/2009, and has slightly increased in size over a long period of time. This is consistent with a small IPMN and benign given indolent behavior over greater than 10 years. 5.  Background of very fine centrilobular pulmonary nodules, most concentrated in the lung apices, most commonly seen in smoking-related respiratory bronchiolitis. 6. Coronary artery disease. Aortic Atherosclerosis (ICD10-I70.0). Electronically Signed   By: Delanna Ahmadi M.D.   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 -He presented with unintentional weight loss, fatigue, and IDA, work-up showed bleeding mass in the duodenum.  Although the scope could not pass patient has no signs of obstruction.  Biopsy confirmed moderate-poorly differentiated adenocarcinoma -Staging CT CAP showed mildly enlarged locoregional lymph nodes, pancreatic IPMN, and small pulmonary nodules.  Overall no definite evidence of metastatic disease -Due to the early stage, this is likely resectable disease, we reviewed it would be a complicated surgery.  He is healthy and was active until the past few months, he would likely be an acceptable surgical candidate but understands he may require some clearance first.  We have referred him to Dr. Barry Dienes or Dr. Zenia Resides -We plan to discuss his case in GI conference this week, will call patient with the recommendations. -We discussed the high risk for recurrence after surgery (30-40% or higher), especially if he has lymph node involvement, CT suggests some locally enlarged LNs. Adjuvant chemo is likely, pending final surgical path and recovery  -If he is deemed not a surgical candidate or decides not to proceed with surgery, he would likely be an ideal candidate for palliative chemo and/or radiation to control his disease. He understands the low chance of cure with these treatments alone -We have requested MMR to see if he is a candidate for  immunotherapy. -We will follow-up after conference, next appointment is pending.  If he proceeds with upfront surgery, we will see him back after that -Pt seen with Dr. Burr Medico, met GI Navigator Ihor Gully, RN who will follow  2.  IDA -Secondary to #1 -He developed mild anemia Hgb 12.7 07/07/2020 -11/16/2020 Hgb 11.6, ferritin  12, he began oral iron -02/16/2021 Hgb 9.6, ferritin 5, he began further diagnostic work-up -We will repeat CBC and iron panel today to see if he would benefit from IV iron, we reviewed potential side effects including allergic reaction, he agrees to proceed if needed  3. Decreased appetite, weight loss, gas pain, right flank pain -Secondary to #1 -He has lost over 20 pounds in the last year, mostly gradually -He has decreased appetite but eats a normal diet, denies dysphagia/odynophagia or signs of obstruction -He drinks 2 Ensure per day -We discussed improving nutrition and gaining weight prior to potential surgery, he agrees to dietitian referral and the order was placed today  4. Genetics -Due to patient's personal history of previous colon and prostate cancers, and new diagnosis of duodenal cancer, in addition to strong family history of cancers, he meets criteria for genetic testing -Family history includes mother with bladder cancer, MGM with breast cancer, sister with colon cancer, brother with lung cancer, and daughter with colon cancer in her 71s -Patient is agreeable, he was referred to genetic counselor today  5. History of prostate cancer Gleason 7 (3+4) intermediate risk stage T1c -S/p curative radiation per Dr. Valere Dross 08/06/2013 - 10/03/2013.  -Treated sites: prostate 7800 cGy in 40 fractions and seminal vesicles 5600 cGy in 40 fractions  6. Incidental finding pancreatic IPMN -Found on imaging in 2011, s/p EUS biopsy 06/17/2009 FNA showed no malignant cells, c/w cyst -Staging for duodenal cancer shows mild enlargement but still c/w IPMN -We reviewed this  is a benign condition  7. History of colon cancer -Per patient he developed rectal pain, found to have cancer in the transverse colon -S/p transverse colectomy by Dr. Margot Chimes 12/27/1998 -Final path showed moderately differentiated adenocarcinoma of the transverse colon extending through muscularis propria, 23 lymph nodes negative, pT3 pN0 cM0 stage II -He completed 6 months of adjuvant intravenous chemo per Dr. Marin Olp and  -Colonoscopy 03/18/2021 by Dr. Wilfrid Lund showed prior end-to-end colonic anastomosis with healthy-appearing mucosa.  Colonoscopy most prior to that 01/10/2018  8. HTN, HL, GERD, arthritis -Per PCP Dr. Cathlean Cower -On amlodipine, atorvastatin, benazepril, HCTZ, metoprolol, potassium, pantoprazole -Takes tramadol as needed for arthritic left knee pain  9. Goals of care -We discussed his goals of care, patient is interested in curative surgery if he is a candidate.  We have referred him for surgical consultation and plan to discuss his case in tumor board this week -If he is not a surgical candidate, or decides not to proceed with surgery, we discussed alternative cancer treatments including potentially chemotherapy, immunotherapy, and/or radiation -We discussed the high risk for recurrence of duodenal cancer even after surgical resection, especially if there is lymph node involvement.  -Adjuvant chemo pending final surgical path  PLAN: -Work up reviewed -Lab today -IV iron pending today's labs- -Discuss case in tumor board 1/18, call pt with recs -Refer to Dr. Barry Dienes or Dr. Zenia Resides, genetics, and dietician -Request MMR on initial biopsy -F/up pending tumor board discussion   Orders Placed This Encounter  Procedures   CBC with Differential ([place] Only)    Standing Status:   Standing    Number of Occurrences:   50    Standing Expiration Date:   04/04/2022   CMP ([place] only)    Standing Status:   Standing    Number of Occurrences:   50    Standing  Expiration Date:   04/04/2022   Ferritin    Standing Status:   Standing  Number of Occurrences:   50    Standing Expiration Date:   04/04/2022   Iron and Iron Binding Capacity ([facility] only)    Standing Status:   Standing    Number of Occurrences:   50    Standing Expiration Date:   04/04/2022   Ambulatory Referral to [facility]    Referral Priority:   Urgent    Referral Type:   Consultation    Referral Reason:   Specialty Services Required    Number of Visits Requested:   1   Ambulatory referral to Genetics    Referral Priority:   Routine    Referral Type:   Consultation    Referral Reason:   Specialty Services Required    Number of Visits Requested:   1   Ambulatory referral to General Surgery    Referral Priority:   Routine    Referral Type:   Surgical    Referral Reason:   Specialty Services Required    Requested Specialty:   General Surgery    Number of Visits Requested:   1    All questions were answered. The patient knows to call the clinic with any problems, questions or concerns.     Alla Feeling, NP 04/04/2021    Addendum  I have seen the patient, examined him. I agree with the assessment and and plan and have edited the notes.   76 yo male with PMH of colon cancer, prostate cancer, hypertension, presented with fatigue, iron deficient anemia, and weight loss for 3 to 4 months.  His work-up showed a partially obstructive mass in the duodenum, biopsy showed adenocarcinoma.  Staging CT scan was negative for distant metastasis.  I will refer him to Dr. Barry Dienes or Dr. Zenia Resides to discuss Whipple surgery.  I discussed role of adjuvant chemotherapy if he has node positive disease on surgical path.  We also discussed cancer surveillance after surgery.  Due to his multiple cancer history, will refer him to genetics.  He is interested.  We will repeat labs to see if he needs IV iron.  Nutritional consult for his weight loss.  Follow-up after surgery.  Questions were  answered.  [name]  04/04/2021

## 2021-04-04 ENCOUNTER — Encounter: Payer: Self-pay | Admitting: Nurse Practitioner

## 2021-04-04 ENCOUNTER — Other Ambulatory Visit: Payer: Self-pay

## 2021-04-04 ENCOUNTER — Inpatient Hospital Stay: Payer: Medicare Other

## 2021-04-04 ENCOUNTER — Inpatient Hospital Stay: Payer: Medicare Other | Attending: Nurse Practitioner | Admitting: Nurse Practitioner

## 2021-04-04 VITALS — BP 157/63 | HR 96 | Temp 98.4°F | Resp 18 | Ht 66.0 in | Wt 224.7 lb

## 2021-04-04 DIAGNOSIS — Z87891 Personal history of nicotine dependence: Secondary | ICD-10-CM | POA: Diagnosis not present

## 2021-04-04 DIAGNOSIS — Z801 Family history of malignant neoplasm of trachea, bronchus and lung: Secondary | ICD-10-CM | POA: Diagnosis not present

## 2021-04-04 DIAGNOSIS — I10 Essential (primary) hypertension: Secondary | ICD-10-CM | POA: Insufficient documentation

## 2021-04-04 DIAGNOSIS — Z79899 Other long term (current) drug therapy: Secondary | ICD-10-CM | POA: Diagnosis not present

## 2021-04-04 DIAGNOSIS — Z7982 Long term (current) use of aspirin: Secondary | ICD-10-CM | POA: Diagnosis not present

## 2021-04-04 DIAGNOSIS — Z85038 Personal history of other malignant neoplasm of large intestine: Secondary | ICD-10-CM | POA: Diagnosis not present

## 2021-04-04 DIAGNOSIS — C170 Malignant neoplasm of duodenum: Secondary | ICD-10-CM | POA: Diagnosis present

## 2021-04-04 DIAGNOSIS — Z923 Personal history of irradiation: Secondary | ICD-10-CM | POA: Diagnosis not present

## 2021-04-04 DIAGNOSIS — D508 Other iron deficiency anemias: Secondary | ICD-10-CM | POA: Insufficient documentation

## 2021-04-04 DIAGNOSIS — Z800 Family history of malignant neoplasm of digestive organs: Secondary | ICD-10-CM | POA: Diagnosis not present

## 2021-04-04 DIAGNOSIS — Z8546 Personal history of malignant neoplasm of prostate: Secondary | ICD-10-CM | POA: Insufficient documentation

## 2021-04-04 LAB — IRON AND IRON BINDING CAPACITY (CC-WL,HP ONLY)
Iron: 23 ug/dL — ABNORMAL LOW (ref 45–182)
Saturation Ratios: 7 % — ABNORMAL LOW (ref 17.9–39.5)
TIBC: 309 ug/dL (ref 250–450)
UIBC: 286 ug/dL (ref 117–376)

## 2021-04-04 LAB — CBC WITH DIFFERENTIAL (CANCER CENTER ONLY)
Abs Immature Granulocytes: 0.01 10*3/uL (ref 0.00–0.07)
Basophils Absolute: 0 10*3/uL (ref 0.0–0.1)
Basophils Relative: 0 %
Eosinophils Absolute: 0.2 10*3/uL (ref 0.0–0.5)
Eosinophils Relative: 2 %
HCT: 36.3 % — ABNORMAL LOW (ref 39.0–52.0)
Hemoglobin: 10.9 g/dL — ABNORMAL LOW (ref 13.0–17.0)
Immature Granulocytes: 0 %
Lymphocytes Relative: 14 %
Lymphs Abs: 1.4 10*3/uL (ref 0.7–4.0)
MCH: 22.2 pg — ABNORMAL LOW (ref 26.0–34.0)
MCHC: 30 g/dL (ref 30.0–36.0)
MCV: 74.1 fL — ABNORMAL LOW (ref 80.0–100.0)
Monocytes Absolute: 0.5 10*3/uL (ref 0.1–1.0)
Monocytes Relative: 5 %
Neutro Abs: 7.7 10*3/uL (ref 1.7–7.7)
Neutrophils Relative %: 79 %
Platelet Count: 315 10*3/uL (ref 150–400)
RBC: 4.9 MIL/uL (ref 4.22–5.81)
RDW: 18.1 % — ABNORMAL HIGH (ref 11.5–15.5)
WBC Count: 9.7 10*3/uL (ref 4.0–10.5)
nRBC: 0 % (ref 0.0–0.2)

## 2021-04-04 LAB — CMP (CANCER CENTER ONLY)
ALT: 10 U/L (ref 0–44)
AST: 12 U/L — ABNORMAL LOW (ref 15–41)
Albumin: 3.7 g/dL (ref 3.5–5.0)
Alkaline Phosphatase: 97 U/L (ref 38–126)
Anion gap: 8 (ref 5–15)
BUN: 16 mg/dL (ref 8–23)
CO2: 29 mmol/L (ref 22–32)
Calcium: 9.5 mg/dL (ref 8.9–10.3)
Chloride: 106 mmol/L (ref 98–111)
Creatinine: 1.19 mg/dL (ref 0.61–1.24)
GFR, Estimated: >60 mL/min (ref >60)
Glucose, Bld: 105 mg/dL — ABNORMAL HIGH (ref 70–99)
Potassium: 3.3 mmol/L — ABNORMAL LOW (ref 3.5–5.1)
Sodium: 143 mmol/L (ref 135–145)
Total Bilirubin: 1 mg/dL (ref 0.3–1.2)
Total Protein: 7.4 g/dL (ref 6.5–8.1)

## 2021-04-04 LAB — FERRITIN: Ferritin: 46 ng/mL (ref 24–336)

## 2021-04-04 NOTE — Progress Notes (Signed)
I met with Mr and Mrs Millhouse after his consultation with Cira Rue, NP and  Dr Burr Medico.  I explained my role as a nurse navigator and provided my contact information. I explained the services provided at [facility] and provided written information.   All questions were answered.  They verbalized understanding.

## 2021-04-05 NOTE — Progress Notes (Signed)
Referral, ov note, demographics, and insurance information faxed to [facility]. 

## 2021-04-06 ENCOUNTER — Other Ambulatory Visit: Payer: Self-pay

## 2021-04-06 NOTE — Progress Notes (Signed)
The proposed treatment discussed in conference is for discussion purpose only and is not a binding recommendation.  The patients have not been physically examined, or presented with their treatment options.  Therefore, final treatment plans cannot be decided.  

## 2021-04-07 ENCOUNTER — Other Ambulatory Visit: Payer: Self-pay | Admitting: Genetic Counselor

## 2021-04-07 ENCOUNTER — Other Ambulatory Visit: Payer: Self-pay

## 2021-04-07 ENCOUNTER — Other Ambulatory Visit: Payer: Self-pay | Admitting: Nurse Practitioner

## 2021-04-07 ENCOUNTER — Inpatient Hospital Stay (HOSPITAL_BASED_OUTPATIENT_CLINIC_OR_DEPARTMENT_OTHER): Payer: Medicare Other | Admitting: Genetic Counselor

## 2021-04-07 ENCOUNTER — Inpatient Hospital Stay: Payer: Medicare Other

## 2021-04-07 DIAGNOSIS — Z800 Family history of malignant neoplasm of digestive organs: Secondary | ICD-10-CM

## 2021-04-07 DIAGNOSIS — Z85038 Personal history of other malignant neoplasm of large intestine: Secondary | ICD-10-CM | POA: Diagnosis not present

## 2021-04-07 DIAGNOSIS — Z1379 Encounter for other screening for genetic and chromosomal anomalies: Secondary | ICD-10-CM

## 2021-04-07 DIAGNOSIS — C170 Malignant neoplasm of duodenum: Secondary | ICD-10-CM

## 2021-04-07 DIAGNOSIS — C61 Malignant neoplasm of prostate: Secondary | ICD-10-CM | POA: Diagnosis not present

## 2021-04-07 LAB — CMP (CANCER CENTER ONLY)
ALT: 10 U/L (ref 0–44)
AST: 12 U/L — ABNORMAL LOW (ref 15–41)
Albumin: 3.6 g/dL (ref 3.5–5.0)
Alkaline Phosphatase: 85 U/L (ref 38–126)
Anion gap: 6 (ref 5–15)
BUN: 14 mg/dL (ref 8–23)
CO2: 31 mmol/L (ref 22–32)
Calcium: 9.4 mg/dL (ref 8.9–10.3)
Chloride: 108 mmol/L (ref 98–111)
Creatinine: 1.18 mg/dL (ref 0.61–1.24)
GFR, Estimated: >60 mL/min (ref >60)
Glucose, Bld: 97 mg/dL (ref 70–99)
Potassium: 4.1 mmol/L (ref 3.5–5.1)
Sodium: 145 mmol/L (ref 135–145)
Total Bilirubin: 0.9 mg/dL (ref 0.3–1.2)
Total Protein: 6.8 g/dL (ref 6.5–8.1)

## 2021-04-07 LAB — CBC WITH DIFFERENTIAL (CANCER CENTER ONLY)
Abs Immature Granulocytes: 0.03 10*3/uL (ref 0.00–0.07)
Basophils Absolute: 0 10*3/uL (ref 0.0–0.1)
Basophils Relative: 1 %
Eosinophils Absolute: 0.2 10*3/uL (ref 0.0–0.5)
Eosinophils Relative: 2 %
HCT: 33.4 % — ABNORMAL LOW (ref 39.0–52.0)
Hemoglobin: 10 g/dL — ABNORMAL LOW (ref 13.0–17.0)
Immature Granulocytes: 0 %
Lymphocytes Relative: 22 %
Lymphs Abs: 1.8 10*3/uL (ref 0.7–4.0)
MCH: 22.3 pg — ABNORMAL LOW (ref 26.0–34.0)
MCHC: 29.9 g/dL — ABNORMAL LOW (ref 30.0–36.0)
MCV: 74.4 fL — ABNORMAL LOW (ref 80.0–100.0)
Monocytes Absolute: 0.6 10*3/uL (ref 0.1–1.0)
Monocytes Relative: 7 %
Neutro Abs: 5.7 10*3/uL (ref 1.7–7.7)
Neutrophils Relative %: 68 %
Platelet Count: 313 10*3/uL (ref 150–400)
RBC: 4.49 MIL/uL (ref 4.22–5.81)
RDW: 18 % — ABNORMAL HIGH (ref 11.5–15.5)
WBC Count: 8.3 10*3/uL (ref 4.0–10.5)
nRBC: 0 % (ref 0.0–0.2)

## 2021-04-07 LAB — GENETIC SCREENING ORDER

## 2021-04-08 ENCOUNTER — Telehealth: Payer: Self-pay

## 2021-04-08 ENCOUNTER — Encounter: Payer: Self-pay | Admitting: Genetic Counselor

## 2021-04-08 ENCOUNTER — Other Ambulatory Visit: Payer: Self-pay | Admitting: General Surgery

## 2021-04-08 ENCOUNTER — Encounter: Payer: Self-pay | Admitting: Nurse Practitioner

## 2021-04-08 NOTE — Progress Notes (Signed)
REFERRING PROVIDER: Santiago Glad, NP [address] [place], [place] [address]  PRIMARY PROVIDER:  Corwin Levins, MD  PRIMARY REASON FOR VISIT:  1. Adenocarcinoma of duodenum (HCC)   2. Malignant neoplasm of prostate (HCC)   3. History of colon cancer   4. Family history of colon cancer     HISTORY OF PRESENT ILLNESS:   Aaron Moss, a 76 y.o. male, was seen for a [place] cancer genetics consultation at the request of Santiago Glad, NP due to a personal and family history of cancer.  Aaron Moss presents to clinic today to discuss the possibility of a hereditary predisposition to cancer, to discuss genetic testing, and to further clarify his future cancer risks, as well as potential cancer risks for family members.   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 Additionally, he has a history of prostate cancer diagnosed at age 59 and colon cancer diagnosed at age 88.  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 Simple appearing right renal cyst. 2. Otherwise negative abdominal ultrasound   03/18/2021 Procedure   Upper Endoscopy/Colonoscopy, Dr. Myrtie Neither  Upper Endoscopy Impression: - Normal esophagus. - A single submucosal papule (nodule) found in the stomach. - Duodenal mass. Biopsied. Concerning for malignancy.  Colonoscopy Impression: - Diverticulosis in the entire examined colon. - The examined portion of the ileum was normal. - Patent end-to-end colo-colonic anastomosis, characterized by healthy appearing mucosa. - Internal hemorrhoids. - No specimens collected.   03/18/2021 Initial Biopsy   Diagnosis Duodenum, Biopsy - INVASIVE MODERATE TO POORLY DIFFERENTIATED ADENOCARCINOMA   03/22/2021 Initial Diagnosis   Adenocarcinoma of duodenum (HCC)   04/01/2021 Imaging   EXAM: CT CHEST, ABDOMEN, AND  PELVIS WITH CONTRAST  IMPRESSION: 1. Circumferential wall thickening of the first portion of the duodenum, in keeping with known primary malignancy. 2. Enlarged, hypodense lymph nodes anterior to the pancreatic head and in the portacaval station, concerning for nodal metastatic disease. 3. No other evidence of metastatic disease in the chest, abdomen, or pelvis. 4. Incidental note of a fluid attenuation lesion within the proximal pancreatic body measuring 1.9 x 1.4 cm, with prominence of the pancreatic duct distally, up to 0.6 cm. This lesion was present on remote prior examination dated 05/04/2009, and has slightly increased in size over a long period of time. This is consistent with a small IPMN and benign given indolent behavior over greater than 10 years. 5. Background of very fine centrilobular pulmonary nodules, most concentrated in the lung apices, most commonly seen in smoking-related respiratory bronchiolitis. 6. Coronary artery disease.   Aortic Atherosclerosis (ICD10-I70.0).      Past Medical History:  Diagnosis Date   Colon cancer (HCC) 2000   Diverticulosis of colon    GERD (gastroesophageal reflux disease)    Glaucoma    History of chemotherapy 2000   colon cancer   History of colon cancer    History of radiation therapy 08/06/13- 10/03/13   prostate 7800 cGy 40 sessions, seminal vesicles 5600 cGy 40 sessions   Hyperlipidemia    Hypertension    Left knee DJD    Prostate cancer (HCC) 05/23/2013   gleason 3+4=7, volume 11.5 ml    Past Surgical History:  Procedure Laterality Date   COLON SURGERY  2000   colon cancer   COLONOSCOPY     EUS  pancreatic cyst   KNEE ARTHROSCOPY  2007   [facility]- [facility]   PROSTATE BIOPSY  05/23/13   gleason 7, volume 11.5 ml    Social History   Socioeconomic History   Marital status: Married    Spouse name: Not on file   Number of children: 1   Years of education: Not on file   Highest education level: Not on file   Occupational History   Not on file  Tobacco Use   Smoking status: Former    Packs/day: 1.00    Years: 22.00    Pack years: 22.00    Types: Cigarettes    Quit date: 03/20/1988    Years since quitting: 33.0   Smokeless tobacco: Never  Vaping Use   Vaping Use: Never used  Substance and Sexual Activity   Alcohol use: Not Currently    Comment: rare   Drug use: No   Sexual activity: Not Currently  Other Topics Concern   Not on file  Social History Narrative   Not on file   Social Determinants of Health   Financial Resource Strain: Low Risk    Difficulty of Paying Living Expenses: Not hard at all  Food Insecurity: No Food Insecurity   Worried About Charity fundraiser in the Last Year: Never true   [place] in the Last Year: Never true  Transportation Needs: No Transportation Needs   Lack of Transportation (Medical): No   Lack of Transportation (Non-Medical): No  Physical Activity: Sufficiently Active   Days of Exercise per Week: 3 days   Minutes of Exercise per Session: 60 min  Stress: No Stress Concern Present   Feeling of Stress : Not at all  Social Connections: Socially Integrated   Frequency of Communication with Friends and Family: More than three times a week   Frequency of Social Gatherings with Friends and Family: More than three times a week   Attends Religious Services: 1 to 4 times per year   Active Member of [employer] or Organizations: Yes   Attends Archivist Meetings: 1 to 4 times per year   Marital Status: Married     FAMILY HISTORY:  We obtained a detailed, 4-generation family history.  Significant diagnoses are listed below: Family History  Problem Relation Age of Onset   Cancer Mother        Liver? bladder?, dx. late 43s   Colon cancer Sister 43   Cancer Brother        Lung   Diabetes Brother    Cancer Maternal Aunt        unknown type   Breast cancer Maternal Grandmother        dx. >50   Cancer Daughter 41       colon    Esophageal cancer Neg Hx    Rectal cancer Neg Hx    Stomach cancer Neg Hx      Aaron Moss daughter was diagnosed with colon cancer at age 75. His sister was diagnosed with colon cancer at age 101. His brother was diagnosed with lung cancer in his 51s, he smoked and is deceased. His mother was diagnosed with cancer in her 55s, the cancer was either liver or bladder cancer, she died at age 80. His maternal aunt was diagnosed with an unknown cancer at an unknown age, she is deceased. His maternal grandmother was diagnosed with breast cancer at an unknown age (>50). Aaron Moss is unaware of previous family history of genetic  testing for hereditary cancer risks. There is no reported Ashkenazi Jewish ancestry.   GENETIC COUNSELING ASSESSMENT: Aaron Moss is a 76 y.o. 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 We, therefore, discussed and recommended the following at today's visit.   DISCUSSION: We discussed that 5 - 10% of cancer is hereditary and there are numerous genes associated with hereditary cancer syndromes.  We discussed that testing is beneficial for several reasons including knowing how to follow individuals after completing their treatment, identifying whether potential treatment options would be beneficial, and understanding if other family members could be at risk for cancer and allowing them to undergo genetic testing.   We reviewed the characteristics, features and inheritance patterns of hereditary cancer syndromes. We also discussed genetic testing, including the appropriate family members to test, the process of testing, insurance coverage and turn-around-time for results. We discussed the implications of a negative, positive, carrier and/or variant of uncertain significant result.   Aaron Moss  was offered a common hereditary cancer panel (47 genes) and an expanded pan-cancer panel (84  genes). Aaron Moss was informed of the benefits and limitations of each panel, including that expanded pan-cancer panels contain genes that do not have clear management guidelines at this point in time.  We also discussed that as the number of genes included on a panel increases, the chances of variants of uncertain significance increases.  After considering the benefits and limitations of each gene panel, Aaron Moss elected to have [facility]aced[facility] Moss.  The CancerNext-Expanded gene panel offered by [facility] and includes sequencing, rearrangement, and RNA analysis for the following 77 genes: AIP, ALK, APC, ATM, AXIN2, BAP1, BARD1, BLM, BMPR1A, BRCA1, BRCA2, BRIP1, CDC73, CDH1, CDK4, CDKN1B, CDKN2A, CHEK2, CTNNA1, DICER1, FANCC, FH, FLCN, GALNT12, KIF1B, LZTR1, MAX, MEN1, MET, MLH1, MSH2, MSH3, MSH6, MUTYH, NBN, NF1, NF2, NTHL1, PALB2, PHOX2B, PMS2, POT1, PRKAR1A, PTCH1, PTEN, RAD51C, RAD51D, RB1, RECQL, RET, SDHA, SDHAF2, SDHB, SDHC, SDHD, SMAD4, SMARCA4, SMARCB1, SMARCE1, STK11, SUFU, TMEM127, TP53, TSC1, TSC2, VHL and XRCC2 (sequencing and deletion/duplication); EGFR, EGLN1, HOXB13, KIT, MITF, PDGFRA, POLD1, and POLE (sequencing only); EPCAM and GREM1 (deletion/duplication only).   Based on Aaron Moss personal and family history of cancer, he meets medical criteria for genetic testing. Despite that he meets criteria, he may still have an out of pocket cost. We discussed that if his out of pocket cost for testing is over $100, the laboratory will call and confirm whether he wants to proceed with testing.  If the out of pocket cost of testing is less than $100 he will be billed by the genetic testing laboratory.   PLAN: After considering the risks, benefits, and limitations, Aaron Moss provided informed consent to pursue genetic testing and the blood sample was sent to [facility] for analysis of the CancerNext-Expanded Panel. Results should be available within approximately 2-3  weeks' time, at which point they will be disclosed by telephone to Aaron Moss, as will any additional recommendations warranted by these results. Aaron Moss will receive a summary of his genetic counseling visit and a copy of his results once available. This information will also be available in Epic.   Aaron Moss questions were answered to his satisfaction today. Our contact information was provided should additional questions or concerns arise. Thank you for the referral and allowing [place] to share in the care of your patient.   Aaron Passy, MS, [facility]  Genetic Counselor [email or web address] (P) [phone]  The patient was seen for a total of 30 minutes in face-to-face genetic counseling.  The patient brought his daughter. Drs. Lindi Adie and/or Burr Medico were available to discuss this case as needed.   _______________________________________________________________________ For Office Staff:  Number of people involved in session: 2 Was an Intern/ student involved with case: no

## 2021-04-08 NOTE — Telephone Encounter (Signed)
I spoke with Aaron Moss.  He states he has constipation while taking oral iron supplements.  He has tried miralax daily without success.  I recommended he increase miralax to twice daily.  Scheduling message sent for venofer infusion x 1.  He state he was seen by Dr Nicholas Lose yesterday.  Once he has cardiac clearance he will have surgery.  I told him I would reach out with a follow up appt with Dr Burr Medico when we know his surgery date.  All questions were answered.  *Heverbalized understanding.

## 2021-04-08 NOTE — Telephone Encounter (Signed)
° °  Pre-operative Risk Assessment    Patient Name: Aaron Moss  DOB: May 02, 1945 MRN: [id number]      Request for Surgical Clearance    Procedure:   diagnostic laparoscopy, whipple, feeding tube placement  Date of Surgery:  Clearance TBD                                 Surgeon:  [name], MD Surgeon's Group or Practice Name:  [facility] Phone number:  [phone] Fax number:  [phone], attention Emeline Gins, CMA   Type of Clearance Requested:   - Medical  - Pharmacy:  Hold Aspirin     Type of Anesthesia:  General    Additional requests/questions:  Please advise surgeon/provider what medications should be held.  Kathrine Cords   04/08/2021, 5:54 PM

## 2021-04-08 NOTE — Telephone Encounter (Signed)
-----   Message from Alla Feeling, NP sent at 04/07/2021  3:01 PM EST ----- Please call patient to let him know he has mild anemia and iron deficiency, but ferritin has much improved on oral iron. I put in orders for venofer, please get approved. But if he is tolerating oral iron I think we can hold off on IV for now. If he is not tolerating oral iron, please schedule him for 1 dose IV venofer 300 mg.  Santiago Glad, do we need to schedule f/up with him, or is he going to see surgery?  Thanks, Regan Rakers

## 2021-04-11 ENCOUNTER — Telehealth: Payer: Self-pay | Admitting: Nurse Practitioner

## 2021-04-11 NOTE — Telephone Encounter (Signed)
Primary Cardiologist:None  Chart reviewed as part of pre-operative protocol coverage. Because of Aaron Moss past medical history and time since last visit, he/she will require a follow-up visit in order to better assess preoperative cardiovascular risk.  Pre-op covering staff: - Please schedule appointment and call patient to inform them. - Please contact requesting surgeon's office via preferred method (i.e, phone, fax) to inform them of need for appointment prior to surgery.  If applicable, this message will also be routed to pharmacy pool and/or primary cardiologist for input on holding anticoagulant/antiplatelet agent as requested below so that this information is available at time of patient's appointment.   Deberah Pelton, NP  04/11/2021, 10:04 AM

## 2021-04-11 NOTE — Telephone Encounter (Signed)
Sch per 1/20 inbasket, sch with already scheduled nutrition appt , pt aware

## 2021-04-11 NOTE — Telephone Encounter (Signed)
Seems pt will be a NEW PT. I am not sure if anyone sent a staff message to scheduling and chart prep team to get an appt for the pt and obtain any notes needed for the MD the pt will be seeing. I will send a message to scheduling and chart prep team to please call the pt with a NEW PT APPT. Also I am not ure if a referral has ben sent to our office. Chart prep will need to review if referral has been sent to our office. I will update the requesting office that we have not seen this pt and he will require a NEW PT APP.

## 2021-04-12 NOTE — Telephone Encounter (Signed)
Patient has been scheduled for an appointment tomorrow 04/13/21 at 2:00 PM to see Dr Stanford Breed in [place].

## 2021-04-13 ENCOUNTER — Ambulatory Visit: Payer: Medicare Other | Admitting: Cardiology

## 2021-04-13 ENCOUNTER — Encounter: Payer: Self-pay | Admitting: Cardiology

## 2021-04-13 ENCOUNTER — Other Ambulatory Visit: Payer: Self-pay

## 2021-04-13 VITALS — BP 168/82 | HR 68 | Ht 66.0 in | Wt 221.4 lb

## 2021-04-13 DIAGNOSIS — R0989 Other specified symptoms and signs involving the circulatory and respiratory systems: Secondary | ICD-10-CM

## 2021-04-13 DIAGNOSIS — E7800 Pure hypercholesterolemia, unspecified: Secondary | ICD-10-CM | POA: Diagnosis not present

## 2021-04-13 DIAGNOSIS — I10 Essential (primary) hypertension: Secondary | ICD-10-CM

## 2021-04-13 DIAGNOSIS — Z01810 Encounter for preprocedural cardiovascular examination: Secondary | ICD-10-CM | POA: Diagnosis not present

## 2021-04-13 DIAGNOSIS — I2510 Atherosclerotic heart disease of native coronary artery without angina pectoris: Secondary | ICD-10-CM

## 2021-04-13 MED ORDER — CARVEDILOL 12.5 MG PO TABS
12.5000 mg | ORAL_TABLET | Freq: Two times a day (BID) | ORAL | 3 refills | Status: DC
Start: 2021-04-13 — End: 2021-04-19

## 2021-04-13 NOTE — Progress Notes (Signed)
Aaron Alstrom, MD Reason for referral-preoperative evaluation prior to resection of adenocarcinoma of the duodenum  HPI: 76 year old male for preoperative evaluation prior to resection of adenocarcinoma of the duodenum at request of [name] MD.  CT January 2023 showed three-vessel coronary calcification.  Patient has no prior cardiac history.  Prior to September of this year he exercised 3 times weekly at the [facility].  He would ride the exercise bike for 30 minutes and lift weights for 15 minutes with no dyspnea or chest pain.  Beginning in September he noticed more fatigue and was found to be anemic.  He underwent EGD and was found to have the above cancer and surgery has been planned.  He still states he can walk at least 1 mile with no dyspnea or chest pain.  He also can climb 2 flights of stairs with no symptoms.  There is no claudication.  There is no dyspnea on exertion, orthopnea, PND.  He occasionally has minimal pedal edema.  Cardiology now asked to evaluate.  Current Outpatient Medications  Medication Sig Dispense Refill   amLODipine (NORVASC) 10 MG tablet TAKE 1 TABLET BY MOUTH ONCE DAILY 90 tablet 3   atorvastatin (LIPITOR) 40 MG tablet Take 1 tablet (40 mg total) by mouth daily. 90 tablet 3   benazepril (LOTENSIN) 40 MG tablet Take 1 tablet (40 mg total) by mouth daily. 90 tablet 3   carvedilol (COREG) 12.5 MG tablet Take 1 tablet (12.5 mg total) by mouth 2 (two) times daily. 180 tablet 3   hydrochlorothiazide (HYDRODIURIL) 25 MG tablet TAKE 1 TABLET BY MOUTH ONCE DAILY. 90 tablet 3   iron polysaccharides (NU-IRON) 150 MG capsule Take 1 capsule (150 mg total) by mouth 2 (two) times daily. 180 capsule 1   pantoprazole (PROTONIX) 40 MG tablet Take 1 tablet (40 mg total) by mouth daily. 90 tablet 3   potassium chloride (KLOR-CON) 10 MEQ tablet Take 3 tablets (30 mEq total) by mouth daily. 270 tablet 1   traMADol (ULTRAM) 50 MG tablet TAKE 1 TABLET BY MOUTH EVERY 6 HOURS AS  NEEDED FOR PAIN 120 tablet 2   Travoprost, BAK Free, (TRAVATAN) 0.004 % SOLN ophthalmic solution Place 1 drop into both eyes at bedtime.     No current facility-administered medications for this visit.    Allergies  Allergen Reactions   Pregabalin     REACTION: felt bad     Past Medical History:  Diagnosis Date   Colon cancer ([place]) 2000   Diverticulosis of colon    GERD (gastroesophageal reflux disease)    Glaucoma    History of chemotherapy 2000   colon cancer   History of radiation therapy 08/06/13- 10/03/13   prostate 7800 cGy 40 sessions, seminal vesicles 5600 cGy 40 sessions   Hyperlipidemia    Hypertension    Left knee DJD    Prostate cancer ([place]) 05/23/2013   gleason 3+4=7, volume 11.5 ml    Past Surgical History:  Procedure Laterality Date   COLON SURGERY  2000   colon cancer   COLONOSCOPY     EUS     pancreatic cyst   KNEE ARTHROSCOPY  2007   [facility]- [facility]   PROSTATE BIOPSY  05/23/13   gleason 7, volume 11.5 ml    Social History   Socioeconomic History   Marital status: Married    Spouse name: Not on file   Number of children: 4   Years of education: Not on file   Highest  education level: Not on file  Occupational History   Not on file  Tobacco Use   Smoking status: Former    Packs/day: 1.00    Years: 22.00    Pack years: 22.00    Types: Cigarettes    Quit date: 03/20/1988    Years since quitting: 33.0   Smokeless tobacco: Never  Vaping Use   Vaping Use: Never used  Substance and Sexual Activity   Alcohol use: Not Currently    Comment: rare   Drug use: No   Sexual activity: Not Currently  Other Topics Concern   Not on file  Social History Narrative   Not on file   Social Determinants of Health   Financial Resource Strain: Low Risk    Difficulty of Paying Living Expenses: Not hard at all  Food Insecurity: No Food Insecurity   Worried About Charity fundraiser in the Last Year: Never true   [place] in the Last Year: Never true   Transportation Needs: No Transportation Needs   Lack of Transportation (Medical): No   Lack of Transportation (Non-Medical): No  Physical Activity: Sufficiently Active   Days of Exercise per Week: 3 days   Minutes of Exercise per Session: 60 min  Stress: No Stress Concern Present   Feeling of Stress : Not at all  Social Connections: Socially Integrated   Frequency of Communication with Friends and Family: More than three times a week   Frequency of Social Gatherings with Friends and Family: More than three times a week   Attends Religious Services: 1 to 4 times per year   Active Member of [employer] or Organizations: Yes   Attends Archivist Meetings: 1 to 4 times per year   Marital Status: Married  Human resources officer Violence: Not on file    Family History  Problem Relation Age of Onset   Cancer Mother        Liver? bladder?, dx. late 17s   Colon cancer Sister 19   Cancer Brother        Lung   Diabetes Brother    Cancer Maternal Aunt        unknown type   Breast cancer Maternal Grandmother        dx. >50   Cancer Daughter 45       colon   Esophageal cancer Neg Hx    Rectal cancer Neg Hx    Stomach cancer Neg Hx     ROS: Some fatigue from anemia and left knee pain but no fevers or chills, productive cough, hemoptysis, dysphasia, odynophagia, melena, hematochezia, dysuria, hematuria, rash, seizure activity, orthopnea, PND, claudication. Remaining systems are negative.  Physical Exam:   Blood pressure (!) 168/82, pulse 68, height 5\' 6"  (1.676 m), weight 221 lb 6.4 oz (100.4 kg), SpO2 98 %.  General:  Well developed/well nourished in NAD Skin warm/dry Patient not depressed No peripheral clubbing Back-normal HEENT-normal/normal eyelids Neck supple/normal carotid upstroke bilaterally; left carotid bruit; no JVD; no thyromegaly chest - CTA/ normal expansion CV - RRR/normal S1 and S2; no rubs or gallops;  PMI nondisplaced; 1/6 systolic ejection murmur. Abdomen  -NT/ND, no HSM, no mass, + bowel sounds, no bruit 2+ femoral pulses, no bruits Ext-no edema, chords, 2+ DP Neuro-grossly nonfocal  ECG -normal sinus rhythm at a rate of 68, no ST changes.  Personally reviewed  A/P  1 preoperative evaluation prior to resection of adenocarcinoma of the duodenum-patient has excellent functional capacity (prior to his recent  cancer diagnosis he was exercising 3 times weekly-riding the stationary bike for 30 minutes and lifting weights for 15 minutes with no symptoms; he still can walk at least 1 mile with no dyspnea or chest pain and can climb stairs with no symptoms).  His electrocardiogram is normal.  Therefore based on guidelines he may proceed without further cardiac evaluation.  2 hypertension-BP elevated; I will discontinue Toprol and instead treat with carvedilol 12.5 mg twice daily.  Follow blood pressure and advance medications as needed.  3 hyperlipidemia-continue statin.  4 coronary artery disease-based on CT demonstrating coronary calcification.  Continue statin.  Will add aspirin 81 mg daily once he has been through all of his surgical procedures.  5 left carotid bruit-schedule carotid Dopplers to rule out significant disease.  Kirk Ruths, MD

## 2021-04-13 NOTE — Patient Instructions (Addendum)
Medication Instructions:   STOP METOPROLOL  START CARVEDILOL 12.5 MG ONE TABLET TWICE DAILY  *If you need a refill on your cardiac medications before your next appointment, please call your pharmacy*   Testing/Procedures:  Your physician has requested that you have a carotid duplex. This test is an ultrasound of the carotid arteries in your neck. It looks at blood flow through these arteries that supply the brain with blood. Allow one hour for this exam. There are no restrictions or special instructions. [facility]   Follow-Up: At [facility], you and your health needs are our priority.  As part of our continuing mission to provide you with exceptional heart care, we have created designated Provider Care Teams.  These Care Teams include your primary Cardiologist (physician) and Advanced Practice Providers (APPs -  Physician Assistants and Nurse Practitioners) who all work together to provide you with the care you need, when you need it.  We recommend signing up for the patient portal called "MyChart".  Sign up information is provided on this After Visit Summary.  MyChart is used to connect with patients for Virtual Visits (Telemedicine).  Patients are able to view lab/test results, encounter notes, upcoming appointments, etc.  Non-urgent messages can be sent to your provider as well.   To learn more about what you can do with MyChart, go to [email or web address].    Your next appointment:     Your physician wants you to follow-up in: [place] will receive a reminder letter in the mail two months in advance. If you don't receive a letter, please call our office to schedule the follow-up appointment.

## 2021-04-13 NOTE — Telephone Encounter (Signed)
Pt has a NEW PT appt with Dr. Stanford Breed today at 2 pm. I will forward notes to MD for appt today. I will send FYI to surgeon's office pt has appt today. Once Dr. Stanford Breed clears the pt he will fax over his ov note giving clearance and any recommendations.

## 2021-04-14 ENCOUNTER — Inpatient Hospital Stay: Payer: Medicare Other

## 2021-04-14 ENCOUNTER — Inpatient Hospital Stay: Payer: Medicare Other | Admitting: Nutrition

## 2021-04-14 VITALS — BP 144/57 | HR 61 | Temp 97.8°F | Resp 17

## 2021-04-14 DIAGNOSIS — D500 Iron deficiency anemia secondary to blood loss (chronic): Secondary | ICD-10-CM

## 2021-04-14 DIAGNOSIS — C170 Malignant neoplasm of duodenum: Secondary | ICD-10-CM

## 2021-04-14 MED ORDER — SODIUM CHLORIDE 0.9 % IV SOLN
300.0000 mg | Freq: Once | INTRAVENOUS | Status: AC
  Administered 2021-04-14: 300 mg via INTRAVENOUS
  Filled 2021-04-14: qty 300

## 2021-04-14 MED ORDER — SODIUM CHLORIDE 0.9 % IV SOLN: Freq: Once | INTRAVENOUS | Status: AC

## 2021-04-14 NOTE — Progress Notes (Signed)
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 Burr Medico.  Patient is receiving Venofer today.  Past medical history includes hypertension, hyperlipidemia, shingles, colon cancer, radiation therapy, alcohol and tobacco usage, GERD, and prostate cancer.  Medications include Protonix and Nu-iron.  Labs were reviewed.  Height: 5 feet 6 inches. Weight: 221.4 pounds January 25. Patient weighed 249 pounds in May 2022. BMI: 35.73.  Patient reports poor appetite.   Reports he was told to follow a low fiber diet to avoid obstruction.   Complained of recent constipation secondary to oral iron.  Provider asked patient to increase MiraLAX.   Patient reports he is drinking 2 cartons of Ensure daily. He cannot remember which Ensure he is drinking.  Nutrition diagnosis:  Unintended weight loss related to cancer and associated treatments as evidenced by 11% weight loss over 8 months.  Intervention: Increase calories and protein in small frequent meals and snacks and strive for weight maintenance. Reviewed low fiber diet and provided nutrition fact sheet. Educated on strategies for improving constipation. Increase hydration.  Continue bowel regimen. Drink Ensure Plus or equivalent twice daily to 3 times daily between meals. Questions answered.  Monitoring, evaluation, goals: Patient will tolerate adequate calories and protein for weight maintenance.  Next visit: To be scheduled as needed.  **Disclaimer: This note was dictated with voice recognition software. Similar sounding words can inadvertently be transcribed and this note may contain transcription errors which may not have been corrected upon publication of note.**

## 2021-04-14 NOTE — Patient Instructions (Signed)

## 2021-04-18 ENCOUNTER — Observation Stay (HOSPITAL_COMMUNITY): Payer: Medicare Other

## 2021-04-18 ENCOUNTER — Other Ambulatory Visit: Payer: Self-pay | Admitting: Nurse Practitioner

## 2021-04-18 ENCOUNTER — Emergency Department (HOSPITAL_COMMUNITY): Payer: Medicare Other

## 2021-04-18 ENCOUNTER — Telehealth: Payer: Self-pay

## 2021-04-18 ENCOUNTER — Observation Stay (HOSPITAL_COMMUNITY)
Admission: EM | Admit: 2021-04-18 | Discharge: 2021-04-19 | Disposition: A | Discharge status: Home / Self Care | Payer: Medicare Other | Attending: Student | Admitting: Student

## 2021-04-18 ENCOUNTER — Encounter (HOSPITAL_COMMUNITY): Payer: Self-pay | Admitting: Emergency Medicine

## 2021-04-18 ENCOUNTER — Encounter (HOSPITAL_COMMUNITY): Payer: Self-pay

## 2021-04-18 ENCOUNTER — Other Ambulatory Visit: Payer: Self-pay

## 2021-04-18 ENCOUNTER — Inpatient Hospital Stay (HOSPITAL_COMMUNITY)
Admission: RE | Admit: 2021-04-18 | Discharge: 2021-04-18 | Disposition: A | Discharge status: Home / Self Care | Payer: Medicare Other | Source: Ambulatory Visit | Attending: Cardiology | Admitting: Cardiology

## 2021-04-18 DIAGNOSIS — Z85038 Personal history of other malignant neoplasm of large intestine: Secondary | ICD-10-CM | POA: Diagnosis not present

## 2021-04-18 DIAGNOSIS — R202 Paresthesia of skin: Secondary | ICD-10-CM

## 2021-04-18 DIAGNOSIS — Z8546 Personal history of malignant neoplasm of prostate: Secondary | ICD-10-CM | POA: Insufficient documentation

## 2021-04-18 DIAGNOSIS — D509 Iron deficiency anemia, unspecified: Secondary | ICD-10-CM | POA: Diagnosis present

## 2021-04-18 DIAGNOSIS — I6522 Occlusion and stenosis of left carotid artery: Secondary | ICD-10-CM

## 2021-04-18 DIAGNOSIS — E876 Hypokalemia: Secondary | ICD-10-CM | POA: Diagnosis not present

## 2021-04-18 DIAGNOSIS — W19XXXA Unspecified fall, initial encounter: Secondary | ICD-10-CM

## 2021-04-18 DIAGNOSIS — I639 Cerebral infarction, unspecified: Secondary | ICD-10-CM

## 2021-04-18 DIAGNOSIS — G459 Transient cerebral ischemic attack, unspecified: Secondary | ICD-10-CM | POA: Diagnosis not present

## 2021-04-18 DIAGNOSIS — Z79899 Other long term (current) drug therapy: Secondary | ICD-10-CM | POA: Insufficient documentation

## 2021-04-18 DIAGNOSIS — Y92009 Unspecified place in unspecified non-institutional (private) residence as the place of occurrence of the external cause: Secondary | ICD-10-CM

## 2021-04-18 DIAGNOSIS — C170 Malignant neoplasm of duodenum: Secondary | ICD-10-CM

## 2021-04-18 DIAGNOSIS — Z87891 Personal history of nicotine dependence: Secondary | ICD-10-CM | POA: Insufficient documentation

## 2021-04-18 DIAGNOSIS — D500 Iron deficiency anemia secondary to blood loss (chronic): Secondary | ICD-10-CM

## 2021-04-18 DIAGNOSIS — E7800 Pure hypercholesterolemia, unspecified: Secondary | ICD-10-CM | POA: Diagnosis not present

## 2021-04-18 DIAGNOSIS — E785 Hyperlipidemia, unspecified: Secondary | ICD-10-CM | POA: Diagnosis present

## 2021-04-18 DIAGNOSIS — I6529 Occlusion and stenosis of unspecified carotid artery: Secondary | ICD-10-CM

## 2021-04-18 DIAGNOSIS — R4789 Other speech disturbances: Secondary | ICD-10-CM | POA: Diagnosis present

## 2021-04-18 DIAGNOSIS — W1839XA Other fall on same level, initial encounter: Secondary | ICD-10-CM | POA: Insufficient documentation

## 2021-04-18 DIAGNOSIS — Y9301 Activity, walking, marching and hiking: Secondary | ICD-10-CM | POA: Diagnosis not present

## 2021-04-18 DIAGNOSIS — R2689 Other abnormalities of gait and mobility: Secondary | ICD-10-CM | POA: Diagnosis not present

## 2021-04-18 HISTORY — DX: Cerebral infarction, unspecified: I63.9

## 2021-04-18 LAB — COMPREHENSIVE METABOLIC PANEL
ALT: 11 U/L (ref 0–44)
AST: 16 U/L (ref 15–41)
Albumin: 3 g/dL — ABNORMAL LOW (ref 3.5–5.0)
Alkaline Phosphatase: 85 U/L (ref 38–126)
Anion gap: 9 (ref 5–15)
BUN: 14 mg/dL (ref 8–23)
CO2: 26 mmol/L (ref 22–32)
Calcium: 8.6 mg/dL — ABNORMAL LOW (ref 8.9–10.3)
Chloride: 104 mmol/L (ref 98–111)
Creatinine, Ser: 1.24 mg/dL (ref 0.61–1.24)
GFR, Estimated: 60 mL/min — ABNORMAL LOW (ref >60)
Glucose, Bld: 106 mg/dL — ABNORMAL HIGH (ref 70–99)
Potassium: 3.4 mmol/L — ABNORMAL LOW (ref 3.5–5.1)
Sodium: 139 mmol/L (ref 135–145)
Total Bilirubin: 1.1 mg/dL (ref 0.3–1.2)
Total Protein: 6.3 g/dL — ABNORMAL LOW (ref 6.5–8.1)

## 2021-04-18 LAB — I-STAT CHEM 8, ED
BUN: 16 mg/dL (ref 8–23)
Calcium, Ion: 1.06 mmol/L — ABNORMAL LOW (ref 1.15–1.40)
Chloride: 103 mmol/L (ref 98–111)
Creatinine, Ser: 1.2 mg/dL (ref 0.61–1.24)
Glucose, Bld: 105 mg/dL — ABNORMAL HIGH (ref 70–99)
HCT: 35 % — ABNORMAL LOW (ref 39.0–52.0)
Hemoglobin: 11.9 g/dL — ABNORMAL LOW (ref 13.0–17.0)
Potassium: 3.3 mmol/L — ABNORMAL LOW (ref 3.5–5.1)
Sodium: 139 mmol/L (ref 135–145)
TCO2: 27 mmol/L (ref 22–32)

## 2021-04-18 LAB — DIFFERENTIAL
Abs Immature Granulocytes: 0.03 10*3/uL (ref 0.00–0.07)
Basophils Absolute: 0 10*3/uL (ref 0.0–0.1)
Basophils Relative: 0 %
Eosinophils Absolute: 0.1 10*3/uL (ref 0.0–0.5)
Eosinophils Relative: 1 %
Immature Granulocytes: 0 %
Lymphocytes Relative: 18 %
Lymphs Abs: 1.6 10*3/uL (ref 0.7–4.0)
Monocytes Absolute: 0.9 10*3/uL (ref 0.1–1.0)
Monocytes Relative: 10 %
Neutro Abs: 6.1 10*3/uL (ref 1.7–7.7)
Neutrophils Relative %: 71 %

## 2021-04-18 LAB — CBC
HCT: 34.3 % — ABNORMAL LOW (ref 39.0–52.0)
Hemoglobin: 10.4 g/dL — ABNORMAL LOW (ref 13.0–17.0)
MCH: 23 pg — ABNORMAL LOW (ref 26.0–34.0)
MCHC: 30.3 g/dL (ref 30.0–36.0)
MCV: 75.9 fL — ABNORMAL LOW (ref 80.0–100.0)
Platelets: 263 10*3/uL (ref 150–400)
RBC: 4.52 MIL/uL (ref 4.22–5.81)
RDW: 18.5 % — ABNORMAL HIGH (ref 11.5–15.5)
WBC: 8.7 10*3/uL (ref 4.0–10.5)
nRBC: 0 % (ref 0.0–0.2)

## 2021-04-18 LAB — LIPID PANEL
Cholesterol: 95 mg/dL (ref 0–200)
HDL: 32 mg/dL — ABNORMAL LOW (ref >40)
LDL Cholesterol: 53 mg/dL (ref 0–99)
Total CHOL/HDL Ratio: 3 RATIO
Triglycerides: 50 mg/dL (ref <150)
VLDL: 10 mg/dL (ref 0–40)

## 2021-04-18 LAB — CBG MONITORING, ED: Glucose-Capillary: 99 mg/dL (ref 70–99)

## 2021-04-18 LAB — HEMOGLOBIN A1C
Hgb A1c MFr Bld: 5.1 % (ref 4.8–5.6)
Mean Plasma Glucose: 99.67 mg/dL

## 2021-04-18 LAB — APTT: aPTT: 27 seconds (ref 24–36)

## 2021-04-18 LAB — PROTIME-INR
INR: 1.1 (ref 0.8–1.2)
Prothrombin Time: 13.9 seconds (ref 11.4–15.2)

## 2021-04-18 MED ORDER — ACETAMINOPHEN 325 MG PO TABS: 650.0000 mg | ORAL_TABLET | ORAL | Status: DC | PRN

## 2021-04-18 MED ORDER — BENAZEPRIL HCL 20 MG PO TABS
40.0000 mg | ORAL_TABLET | Freq: Every day | ORAL | Status: DC
  Administered 2021-04-19: 40 mg via ORAL
  Filled 2021-04-18: qty 2
  Filled 2021-04-18: qty 1

## 2021-04-18 MED ORDER — POTASSIUM CHLORIDE CRYS ER 20 MEQ PO TBCR
20.0000 meq | EXTENDED_RELEASE_TABLET | ORAL | Status: AC
  Administered 2021-04-18: 20 meq via ORAL
  Filled 2021-04-18: qty 1

## 2021-04-18 MED ORDER — ACETAMINOPHEN 650 MG RE SUPP: 650.0000 mg | RECTAL | Status: DC | PRN

## 2021-04-18 MED ORDER — CLOPIDOGREL BISULFATE 300 MG PO TABS
300.0000 mg | ORAL_TABLET | Freq: Every day | ORAL | Status: DC
  Administered 2021-04-18: 300 mg via ORAL
  Filled 2021-04-18: qty 1

## 2021-04-18 MED ORDER — ENOXAPARIN SODIUM 40 MG/0.4ML IJ SOSY
40.0000 mg | PREFILLED_SYRINGE | INTRAMUSCULAR | Status: DC
  Administered 2021-04-18: 40 mg via SUBCUTANEOUS
  Filled 2021-04-18: qty 0.4

## 2021-04-18 MED ORDER — CLOPIDOGREL BISULFATE 75 MG PO TABS: 75.0000 mg | ORAL_TABLET | Freq: Every day | ORAL | Status: DC

## 2021-04-18 MED ORDER — ACETAMINOPHEN 160 MG/5ML PO SOLN: 650.0000 mg | ORAL | Status: DC | PRN

## 2021-04-18 MED ORDER — HYDROCHLOROTHIAZIDE 25 MG PO TABS
25.0000 mg | ORAL_TABLET | Freq: Every day | ORAL | Status: DC
Start: 2021-04-19 — End: 2021-04-18

## 2021-04-18 MED ORDER — CLOPIDOGREL BISULFATE 300 MG PO TABS: 300.0000 mg | ORAL_TABLET | Freq: Once | ORAL | Status: DC

## 2021-04-18 MED ORDER — TRAMADOL HCL 50 MG PO TABS: 50.0000 mg | ORAL_TABLET | Freq: Four times a day (QID) | ORAL | Status: DC | PRN

## 2021-04-18 MED ORDER — ATORVASTATIN CALCIUM 40 MG PO TABS
40.0000 mg | ORAL_TABLET | Freq: Every day | ORAL | Status: DC
  Administered 2021-04-19: 40 mg via ORAL
  Filled 2021-04-18: qty 1

## 2021-04-18 MED ORDER — PANTOPRAZOLE SODIUM 40 MG PO TBEC
40.0000 mg | DELAYED_RELEASE_TABLET | Freq: Every day | ORAL | Status: DC
  Administered 2021-04-19: 40 mg via ORAL
  Filled 2021-04-18: qty 1

## 2021-04-18 MED ORDER — IOHEXOL 350 MG/ML SOLN
75.0000 mL | Freq: Once | INTRAVENOUS | Status: AC | PRN
  Administered 2021-04-18: 75 mL via INTRAVENOUS

## 2021-04-18 MED ORDER — ASPIRIN EC 81 MG PO TBEC: 81.0000 mg | DELAYED_RELEASE_TABLET | Freq: Every day | ORAL | Status: DC

## 2021-04-18 MED ORDER — AMLODIPINE BESYLATE 10 MG PO TABS
10.0000 mg | ORAL_TABLET | Freq: Every day | ORAL | Status: DC
  Administered 2021-04-19: 10 mg via ORAL
  Filled 2021-04-18: qty 1

## 2021-04-18 MED ORDER — STROKE: EARLY STAGES OF RECOVERY BOOK
Freq: Once | Status: AC
  Filled 2021-04-18: qty 1

## 2021-04-18 MED ORDER — CARVEDILOL 12.5 MG PO TABS
12.5000 mg | ORAL_TABLET | Freq: Two times a day (BID) | ORAL | Status: DC
  Administered 2021-04-18 – 2021-04-19 (×2): 12.5 mg via ORAL
  Filled 2021-04-18: qty 1
  Filled 2021-04-18: qty 4

## 2021-04-18 MED ORDER — SODIUM CHLORIDE 0.9% FLUSH
3.0000 mL | Freq: Once | INTRAVENOUS | Status: AC
  Administered 2021-04-18: 3 mL via INTRAVENOUS

## 2021-04-18 MED ORDER — ASPIRIN 325 MG PO TABS
325.0000 mg | ORAL_TABLET | Freq: Every day | ORAL | Status: DC
  Administered 2021-04-18: 325 mg via ORAL
  Filled 2021-04-18: qty 1

## 2021-04-18 MED ORDER — ASPIRIN EC 81 MG PO TBEC
81.0000 mg | DELAYED_RELEASE_TABLET | Freq: Every day | ORAL | Status: DC
  Administered 2021-04-19: 81 mg via ORAL
  Filled 2021-04-18: qty 1

## 2021-04-18 MED ORDER — CLOPIDOGREL BISULFATE 75 MG PO TABS
75.0000 mg | ORAL_TABLET | Freq: Every day | ORAL | Status: DC
  Administered 2021-04-19: 75 mg via ORAL
  Filled 2021-04-18: qty 1

## 2021-04-18 MED ORDER — POTASSIUM CHLORIDE ER 10 MEQ PO TBCR
30.0000 meq | EXTENDED_RELEASE_TABLET | Freq: Every day | ORAL | Status: DC
Start: 2021-04-18 — End: 2021-04-18

## 2021-04-18 MED ORDER — POTASSIUM CHLORIDE ER 10 MEQ PO TBCR: 30.0000 meq | EXTENDED_RELEASE_TABLET | Freq: Every day | ORAL | Status: DC

## 2021-04-18 MED ORDER — ASPIRIN 325 MG PO TABS: 325.0000 mg | ORAL_TABLET | Freq: Once | ORAL | Status: DC

## 2021-04-18 NOTE — Telephone Encounter (Signed)
This nurse received a call from this patient stating that on Christmas day he experienced numbness in his right hand that lasted for a few minutes and then it went away.  Patient stated that he has not happed again until the following day after his iron infusion on 04/14/21 and since then it has happened 2-3 times. Patient states that he would like to know what may be causing the numbness and are there any recommendations from the provider.  This nurse advised patient that this information will be forwarded to the provider.  No further questions or concerns at this time.

## 2021-04-18 NOTE — ED Notes (Signed)
Pt transported to MRI 

## 2021-04-18 NOTE — Consult Note (Addendum)
Neurology Consult H&P  Aaron Moss MR# [id number] 04/18/2021   CC: transient aphasia  History is obtained from: Patient, EMS, son and chart.  HPI: Aaron Moss is a 76 y.o. male PMHx as reviewed below was last normal ~1200 by wife who fell off bed and began having receptive and expressive aphasia. He had slight improvement in his speech en route however had transient right upper extremity weakness. On arrival speech and weakness had improved and was back to baseline.   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  LKW: 1200 tNK given: No symptoms resolved IR Thrombectomy Not candidate Modified Rankin Scale: 0-Completely asymptomatic and back to baseline post- stroke NIHSS: 0  ROS: A complete ROS was performed and is negative except as noted in the HPI.  Past Medical History:  Diagnosis Date   Colon cancer ([place]) 2000   Diverticulosis of colon    GERD (gastroesophageal reflux disease)    Glaucoma    History of chemotherapy 2000   colon cancer   History of radiation therapy 08/06/13- 10/03/13   prostate 7800 cGy 40 sessions, seminal vesicles 5600 cGy 40 sessions   Hyperlipidemia    Hypertension    Left knee DJD    Prostate cancer ([place]) 05/23/2013   gleason 3+4=7, volume 11.5 ml   Family History  Problem Relation Age of Onset   Cancer Mother        Liver? bladder?, dx. late 83s   Colon cancer Sister 54   Cancer Brother        Lung   Diabetes Brother    Cancer Maternal Aunt        unknown type   Breast cancer Maternal Grandmother        dx. >50   Cancer Daughter 33       colon   Esophageal cancer Neg Hx    Rectal cancer Neg Hx    Stomach cancer Neg Hx    Social History:  reports that he quit smoking about 33 years ago. His smoking use included cigarettes. He has a 22.00 pack-year smoking history. He has never used smokeless tobacco. He  reports that he does not currently use alcohol. He reports that he does not use drugs.   Prior to Admission medications   Medication Sig Start Date End Date Taking? Authorizing Provider  amLODipine (NORVASC) 10 MG tablet TAKE 1 TABLET BY MOUTH ONCE DAILY 07/07/20   Biagio Borg, MD  atorvastatin (LIPITOR) 40 MG tablet Take 1 tablet (40 mg total) by mouth daily. 07/07/20   Biagio Borg, MD  benazepril (LOTENSIN) 40 MG tablet Take 1 tablet (40 mg total) by mouth daily. 07/07/20   Biagio Borg, MD  carvedilol (COREG) 12.5 MG tablet Take 1 tablet (12.5 mg total) by mouth 2 (two) times daily. 04/13/21 07/12/21  Lelon Perla, MD  hydrochlorothiazide (HYDRODIURIL) 25 MG tablet TAKE 1 TABLET BY MOUTH ONCE DAILY. 07/07/20   Biagio Borg, MD  iron polysaccharides (NU-IRON) 150 MG capsule Take 1 capsule (150 mg total) by mouth 2 (two) times daily. 02/17/21   Biagio Borg, MD  pantoprazole (PROTONIX) 40 MG tablet Take 1 tablet (40 mg total) by mouth daily. 02/16/21   Biagio Borg, MD  potassium chloride (KLOR-CON) 10 MEQ tablet Take 3 tablets (30 mEq total) by mouth daily. 07/07/20   Biagio Borg, MD  traMADol (  ULTRAM) 50 MG tablet TAKE 1 TABLET BY MOUTH EVERY 6 HOURS AS NEEDED FOR PAIN 11/12/20   Biagio Borg, MD  Travoprost, BAK Free, (TRAVATAN) 0.004 % SOLN ophthalmic solution Place 1 drop into both eyes at bedtime.    [provider]    Exam: Current vital signs: BP (!) 145/64    Pulse 67    Temp 98.6 F (37 C) (Oral)    Resp 12    Wt 100.4 kg    SpO2 100%    BMI 35.73 kg/m   Physical Exam  Constitutional: Appears well-developed and well-nourished.  Psych: Affect appropriate to situation Eyes: No scleral injection HENT: No OP obstruction. Head: Normocephalic.  Cardiovascular: Normal rate and regular rhythm.  Respiratory: Effort normal, symmetric excursions bilaterally, no audible wheezing. GI: Soft.  No distension. There is no tenderness.  Skin: WDI  Neuro: Mental  Status: Patient is awake, alert, oriented to person, place, month, year, and situation. Patient is able to give a clear and coherent history. Speech fluent, intact comprehension and repetition. No signs of aphasia or neglect. Visual Fields are full. Pupils are equal, round, and reactive to light. EOMI without ptosis or diploplia.  Facial sensation is symmetric to temperature Facial movement is asymmetric mild left droop.  Hearing is intact to voice. Uvula midline and palate elevates symmetrically. Shoulder shrug is symmetric. Tongue is midline without atrophy or fasciculations.  Tone is normal. Bulk is normal. 5/5 strength was present in all four extremities. Sensation is symmetric to light touch and temperature in the arms and legs. Deep Tendon Reflexes: 2+ and symmetric in the biceps and patellae. Toes are downgoing bilaterally. FNF and HKS are intact bilaterally. Gait - Deferred  I have reviewed labs in epic and the pertinent results are: Ca 8.6  I have reviewed the images obtained: NCT head showed no acute intracranial hemorrhage or evidence of acute infarction. ASPECT score is 10. CTA head and neck showed no intracranial large vessel occlusion or correctable proximal stenosis. Widespread distal vessel atherosclerotic irregularity. Extensive calcified plaque at carotid bifurcation on left with masslike calcified plaque in ICA bulb resulting in near occlusion with residual lumen <67mm.   Calcified plaque at the right carotid bifurcation but without measurable stenosis.  Assessment: Aaron Moss is a 76 y.o. male PMHx as noted above, duodenal adenocarcinoma with transient aphasia and right arm weakness resolved. CT head did not show acute ischemic changes and CTA head and neck showed heavily calcified severe/near occlusion of left carotid bulb extending into proxima left ICA which is concordant with transient symptoms indicating carotid lesion is symptomatic.  Recommended aspirin  324mg  and clopidogrel 300mg  now.  Impression:  TIA due to embolism - transient aphasia and right arm weakness resolved. 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 Right carotid atherosclerosis without stenosis. Duodenal adenocarcinoma. HTN HLD  Plan: - Please consult vascular surgery. - MRI brain without contrast. - Recommend TTE. - Recommend labs: HbA1c, lipid panel. - Recommend Statin if LDL > 70 - Continue aspirin 81mg  daily. - Continue clopidogrel 75mg  daily for 3 weeks. - SBP goal <180. - Telemetry monitoring for arrhythmia. - Recommend bedside Swallow screen.  This patient is critically ill and at significant risk of neurological worsening, death and care requires constant monitoring of vital signs, hemodynamics,respiratory and cardiac monitoring, neurological assessment, discussion with family, other specialists and medical decision making of high complexity. I spent 73 minutes of neurocritical care time  in the care of  this patient. This was time spent independent of any time provided by nurse practitioner or PA.  Electronically signed by:  Lynnae Sandhoff, MD Page: [phone] 04/18/2021, 2:27 PM  If 7pm- 7am, please page neurology on call as listed in [place].

## 2021-04-18 NOTE — Code Documentation (Signed)
Stroke Response Nurse Documentation Code Documentation  Aaron Moss is a 76 y.o. male arriving to [facility] ED via [facility] on 04/18/2021 with past medical hx of hypertension, hyperlipidemia, colon and prostate cancer, GERD, and glaucoma. Stopped taking ASA about a month ago. Code stroke was activated by EMS.   Patient from home where he was LKW at 1200 when seen by his wife. Later found by wife having fallen on bed and was aphasic. EMS reports he improved en route and is now an NIHSS of 0.   Stroke team at the bedside on patient arrival. Labs drawn and patient cleared for CT by Dr. Kathrynn Humble. Patient to CT with team. NIHSS 0, see documentation for details and code stroke times.   The following imaging was completed: CT, CTA head and neck. Patient is not a candidate for IV Thrombolytic due to symptoms resolved.   Care/Plan: monitor closely and report if symptoms return.   Bedside handoff with ED RN Camryn.    Earma Reading  Stroke Response RN

## 2021-04-18 NOTE — H&P (Addendum)
History and Physical    Aaron Moss ZDG:[id number] DOB: January 03, 1946 DOA: 04/18/2021  Referring MD/NP/PA: Varney Biles, MD PCP: Biagio Borg, MD  Consultants:Gessner, Ofilia Neas, MD (Gastroenterology) [name], MD (Orthopedic Surgery) [name], MD (Ophthalmology) [name], MD(general surgery) Patient coming from: Home via EMS  Chief Complaint: Fall  I have personally briefly reviewed patient's old medical records in [place]   HPI: Aaron Moss is a 76 y.o. left-handed male with medical history significant of hypertension, hyperlipidemia, prostate cancer s/p radiation therapy, colon cancer in 2000, and adenocarcinoma of the duodenum diagnosed 03/18/2021 who presents after having a fall at home.  He was last known normal around 1200.  He was walking back from the bathroom this morning when he reported feeling lightheaded and fell.  Denies any loss of consciousness or trauma to his head.  When his wife found him he was slurring his speech.  He reports knowing what he wanted to say, but the words would not come out right.  EMS was called and noted right upper extremity weakness which a code stroke was called.  Reports having numbness and weakness of the right hand that occurred on Christmas Day, but resolved on its own.  Normally patient ambulates without need of assistance and notes that he has been keeping self adequately hydrated.  He is in the process of being evaluated for surgery for the adenocarcinoma of the duodenum and was scheduled to have a carotid ultrasound today due to carotid bruit heard on perioperative exam with Dr. Stanford Breed on 1/25.  ED Course: Upon admission into the emergency department patient was seen as a code stroke and was evaluated by neurology.  CT scan of the head showed no acute abnormality.  Noted to be afebrile, respirations 12-26, and all other vital signs maintained.  Labs significant for hemoglobin 10.4 with low MCV and MCH, potassium  3.4, calcium 8.6.  CTA of the head and neck noted severe atherosclerotic disease of the left internal carotid with masslike calcification of the left internal carotid bulb causing near complete occlusion.  Patient was not a candidate for tPA due to resolution of symptoms and malignancy.  Review of Systems  Constitutional:  Positive for weight loss. Negative for fever.  Eyes:  Negative for photophobia.  Respiratory:  Negative for cough and shortness of breath.   Cardiovascular:  Negative for chest pain and leg swelling.  Gastrointestinal:  Negative for abdominal pain, nausea and vomiting.  Musculoskeletal:  Positive for falls. Negative for joint pain.  Neurological:  Positive for speech change and focal weakness. Negative for loss of consciousness.  Psychiatric/Behavioral:  Negative for substance abuse.    Past Medical History:  Diagnosis Date   Colon cancer ([place]) 2000   Diverticulosis of colon    GERD (gastroesophageal reflux disease)    Glaucoma    History of chemotherapy 2000   colon cancer   History of radiation therapy 08/06/13- 10/03/13   prostate 7800 cGy 40 sessions, seminal vesicles 5600 cGy 40 sessions   Hyperlipidemia    Hypertension    Left knee DJD    Prostate cancer ([place]) 05/23/2013   gleason 3+4=7, volume 11.5 ml    Past Surgical History:  Procedure Laterality Date   COLON SURGERY  2000   colon cancer   COLONOSCOPY     EUS     pancreatic cyst   KNEE ARTHROSCOPY  2007   [facility]- [facility]   PROSTATE BIOPSY  05/23/13   gleason 7,  volume 11.5 ml     reports that he quit smoking about 33 years ago. His smoking use included cigarettes. He has a 22.00 pack-year smoking history. He has never used smokeless tobacco. He reports that he does not currently use alcohol. He reports that he does not use drugs.  Allergies  Allergen Reactions   Pregabalin     REACTION: felt bad    Family History  Problem Relation Age of Onset   Cancer Mother        Liver? bladder?, dx. late  56s   Colon cancer Sister 35   Cancer Brother        Lung   Diabetes Brother    Cancer Maternal Aunt        unknown type   Breast cancer Maternal Grandmother        dx. >50   Cancer Daughter 53       colon   Esophageal cancer Neg Hx    Rectal cancer Neg Hx    Stomach cancer Neg Hx     Prior to Admission medications   Medication Sig Start Date End Date Taking? Authorizing Provider  amLODipine (NORVASC) 10 MG tablet TAKE 1 TABLET BY MOUTH ONCE DAILY 07/07/20   Biagio Borg, MD  atorvastatin (LIPITOR) 40 MG tablet Take 1 tablet (40 mg total) by mouth daily. 07/07/20   Biagio Borg, MD  benazepril (LOTENSIN) 40 MG tablet Take 1 tablet (40 mg total) by mouth daily. 07/07/20   Biagio Borg, MD  carvedilol (COREG) 12.5 MG tablet Take 1 tablet (12.5 mg total) by mouth 2 (two) times daily. 04/13/21 07/12/21  Lelon Perla, MD  hydrochlorothiazide (HYDRODIURIL) 25 MG tablet TAKE 1 TABLET BY MOUTH ONCE DAILY. 07/07/20   Biagio Borg, MD  iron polysaccharides (NU-IRON) 150 MG capsule Take 1 capsule (150 mg total) by mouth 2 (two) times daily. 02/17/21   Biagio Borg, MD  pantoprazole (PROTONIX) 40 MG tablet Take 1 tablet (40 mg total) by mouth daily. 02/16/21   Biagio Borg, MD  potassium chloride (KLOR-CON) 10 MEQ tablet Take 3 tablets (30 mEq total) by mouth daily. 07/07/20   Biagio Borg, MD  traMADol (ULTRAM) 50 MG tablet TAKE 1 TABLET BY MOUTH EVERY 6 HOURS AS NEEDED FOR PAIN 11/12/20   Biagio Borg, MD  Travoprost, BAK Free, (TRAVATAN) 0.004 % SOLN ophthalmic solution Place 1 drop into both eyes at bedtime.    [provider]    Physical Exam:  Constitutional: Elderly male currently in no acute distress Vitals:   04/18/21 1415 04/18/21 1430 04/18/21 1445 04/18/21 1500  BP: (!) 145/64 (!) 143/61 (!) 140/48 (!) 136/54  Pulse: 67 72 65 64  Resp: 12 (!) 26 13 15   Temp:      TempSrc:      SpO2: 100% 97% 99% 100%  Weight:       Eyes: PERRL, lids and conjunctivae  normal ENMT: Mucous membranes are moist. Posterior pharynx clear of any exudate or lesions.  Neck: normal, supple, no masses, no thyromegaly Respiratory: clear to auscultation bilaterally, no wheezing, no crackles. Normal respiratory effort. No accessory muscle use.  Cardiovascular: Regular rate and rhythm, no murmurs / rubs / gallops. No extremity edema. 2+ pedal pulses.   Abdomen: no tenderness, no masses palpated.  Bowel sounds present. Musculoskeletal: no clubbing / cyanosis. No joint deformity upper and lower extremities.  Skin: no rashes, lesions, ulcers. No induration Neurologic: CN 2-12 grossly intact.  Fluent speech.  Strength 5/5 all 4 quadrants. Psychiatric: Normal judgment and insight. Alert and oriented x 3. Normal mood.     Labs on Admission: I have personally reviewed following labs and imaging studies  CBC: Recent Labs  Lab 04/18/21 1315 04/18/21 1321  WBC 8.7  --   NEUTROABS 6.1  --   HGB 10.4* 11.9*  HCT 34.3* 35.0*  MCV 75.9*  --   PLT 263  --    Basic Metabolic Panel: Recent Labs  Lab 04/18/21 1315 04/18/21 1321  NA 139 139  K 3.4* 3.3*  CL 104 103  CO2 26  --   GLUCOSE 106* 105*  BUN 14 16  CREATININE 1.24 1.20  CALCIUM 8.6*  --    GFR: Estimated Creatinine Clearance: 57.2 mL/min (by C-G formula based on SCr of 1.2 mg/dL). Liver Function Tests: Recent Labs  Lab 04/18/21 1315  AST 16  ALT 11  ALKPHOS 85  BILITOT 1.1  PROT 6.3*  ALBUMIN 3.0*   No results for input(s): LIPASE, AMYLASE in the last 168 hours. No results for input(s): AMMONIA in the last 168 hours. Coagulation Profile: Recent Labs  Lab 04/18/21 1315  INR 1.1   Cardiac Enzymes: No results for input(s): CKTOTAL, CKMB, CKMBINDEX, TROPONINI in the last 168 hours. BNP (last 3 results) Recent Labs    11/16/20 1216  PROBNP 71.0   HbA1C: No results for input(s): HGBA1C in the last 72 hours. CBG: Recent Labs  Lab 04/18/21 1314  GLUCAP 99   Lipid Profile: No results  for input(s): CHOL, HDL, LDLCALC, TRIG, CHOLHDL, LDLDIRECT in the last 72 hours. Thyroid Function Tests: No results for input(s): TSH, T4TOTAL, FREET4, T3FREE, THYROIDAB in the last 72 hours. Anemia Panel: No results for input(s): VITAMINB12, FOLATE, FERRITIN, TIBC, IRON, RETICCTPCT in the last 72 hours. Urine analysis:    Component Value Date/Time   COLORURINE YELLOW 07/07/2020 1119   APPEARANCEUR CLEAR 07/07/2020 1119   LABSPEC 1.010 07/07/2020 1119   PHURINE 6.0 07/07/2020 1119   GLUCOSEU NEGATIVE 07/07/2020 1119   HGBUR NEGATIVE 07/07/2020 1119   BILIRUBINUR NEGATIVE 07/07/2020 1119   KETONESUR NEGATIVE 07/07/2020 1119   PROTEINUR 100 (A) 02/24/2019 0207   UROBILINOGEN 0.2 07/07/2020 1119   NITRITE NEGATIVE 07/07/2020 1119   LEUKOCYTESUR NEGATIVE 07/07/2020 1119   Sepsis Labs: No results found for this or any previous visit (from the past 240 hour(s)).   Radiological Exams on Admission: CT HEAD CODE STROKE WO CONTRAST  Result Date: 04/18/2021 CLINICAL DATA:  Code stroke.  Neuro deficit, acute, stroke suspected EXAM: CT HEAD WITHOUT CONTRAST TECHNIQUE: Contiguous axial images were obtained from the base of the skull through the vertex without intravenous contrast. RADIATION DOSE REDUCTION: This exam was performed according to the departmental dose-optimization program which includes automated exposure control, adjustment of the mA and/or kV according to patient size and/or use of iterative reconstruction technique. COMPARISON:  None. FINDINGS: Brain: There is no acute intracranial hemorrhage, mass effect, or edema. Gray-white differentiation is preserved. Patchy low-density in the supratentorial white matter is nonspecific but may reflect chronic microvascular ischemic changes. Ventricles and sulci are within normal limits in size and configuration. No extra-axial collection. Vascular: No hyperdense vessel. There is intracranial atherosclerotic calcification at the skull base. Skull:  Unremarkable. Sinuses/Orbits: No significant opacification. Orbits are unremarkable. Other: Mastoid air cells are clear. ASPECTS [facility] Stroke Program Early CT Score) - Ganglionic level infarction (caudate, lentiform nuclei, internal capsule, insula, M1-M3 cortex): 7 - Supraganglionic infarction (M4-M6 cortex): 3  Total score (0-10 with 10 being normal): 10 IMPRESSION: There is no acute intracranial hemorrhage or evidence of acute infarction. ASPECT score is 10. These results were communicated to Dr. Theda Sers at 1:27 pm on 04/18/2021 by text page via the [facility] messaging system. Electronically Signed   By: Macy Mis M.D.   On: 04/18/2021 13:35   CT ANGIO HEAD CODE STROKE  Result Date: 04/18/2021 CLINICAL DATA:  Neuro deficit, acute, stroke suspected. Transient expressive aphasia and right upper extremity weakness. EXAM: CT ANGIOGRAPHY HEAD AND NECK TECHNIQUE: Multidetector CT imaging of the head and neck was performed using the standard protocol during bolus administration of intravenous contrast. Multiplanar CT image reconstructions and MIPs were obtained to evaluate the vascular anatomy. Carotid stenosis measurements (when applicable) are obtained utilizing NASCET criteria, using the distal internal carotid diameter as the denominator. RADIATION DOSE REDUCTION: This exam was performed according to the departmental dose-optimization program which includes automated exposure control, adjustment of the mA and/or kV according to patient size and/or use of iterative reconstruction technique. CONTRAST:  52mL OMNIPAQUE IOHEXOL 350 MG/ML SOLN COMPARISON:  Head CT earlier same day FINDINGS: CTA NECK FINDINGS Aortic arch: Aortic atherosclerosis. No aneurysm or dissection. Branching pattern is normal. Right carotid system: Common carotid artery shows scattered plaque but is widely patent to the bifurcation. Calcified plaque at the carotid bifurcation and ICA bulb. Minimal diameter of the proximal ICA is the same as  the more distal cervical ICA, therefore there is no stenosis. Left carotid system: Common carotid artery shows scattered plaque but is widely patent to the bifurcation. There is extensive dense calcified plaque at the carotid bifurcation. Extensive dense calcification affecting ICA bulb results in near occlusion, luminal diameter less than 1 mm. ECA shows an origin stenosis with diameter of 1 mm. More distal cervical ICA is widely patent. Vertebral arteries: Calcified plaque at the right vertebral artery origin with stenosis of 50%. Left vertebral artery origin widely patent. Beyond the origins, both vertebral arteries appear normal through the cervical region to the foramen magnum. Skeleton: Ordinary cervical spondylosis. 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 Other neck: No lymphadenopathy. The salivary glands appear prominent. There is enlargement and heterogeneity of the thyroid gland. Upper chest: Lung apices are clear. Review of the MIP images confirms the above findings CTA HEAD FINDINGS Anterior circulation: Both internal carotid arteries are patent through the skull base and siphon regions. There is siphon atherosclerotic calcification with stenosis estimated at 50% on both sides. The anterior and middle cerebral vessels are patent. No large vessel occlusion. No proximal stenosis. More distal branch vessels do show ordinary atherosclerotic irregularity. Posterior circulation: Both vertebral arteries are patent through the foramen magnum to the basilar. No basilar stenosis. Posterior circulation branch vessels are patent. More distal branch vessels do show ordinary atherosclerotic irregularity. Venous sinuses: Patent and normal. Anatomic variants: None significant. Review of the MIP images confirms the above findings IMPRESSION: No intracranial large vessel occlusion or correctable proximal stenosis. Widespread distal vessel atherosclerotic  irregularity. Extensive calcified plaque at the carotid bifurcation on the left with masslike calcified plaque within the ICA bulb resulting in near occlusion. Residual lumen is less than 1 mm. This represents 90% or greater stenosis. Calcified plaque at the right carotid bifurcation but without measurable stenosis. 50% stenosis at the right vertebral artery origin. Electronically Signed   By: Nelson Chimes M.D.   On: 04/18/2021 14:21   CT ANGIO NECK CODE STROKE  Result  Date: 04/18/2021 CLINICAL DATA:  Neuro deficit, acute, stroke suspected. Transient expressive aphasia and right upper extremity weakness. EXAM: CT ANGIOGRAPHY HEAD AND NECK TECHNIQUE: Multidetector CT imaging of the head and neck was performed using the standard protocol during bolus administration of intravenous contrast. Multiplanar CT image reconstructions and MIPs were obtained to evaluate the vascular anatomy. Carotid stenosis measurements (when applicable) are obtained utilizing NASCET criteria, using the distal internal carotid diameter as the denominator. RADIATION DOSE REDUCTION: This exam was performed according to the departmental dose-optimization program which includes automated exposure control, adjustment of the mA and/or kV according to patient size and/or use of iterative reconstruction technique. CONTRAST:  38mL OMNIPAQUE IOHEXOL 350 MG/ML SOLN COMPARISON:  Head CT earlier same day FINDINGS: CTA NECK FINDINGS Aortic arch: Aortic atherosclerosis. No aneurysm or dissection. Branching pattern is normal. Right carotid system: Common carotid artery shows scattered plaque but is widely patent to the bifurcation. Calcified plaque at the carotid bifurcation and ICA bulb. Minimal diameter of the proximal ICA is the same as the more distal cervical ICA, therefore there is no stenosis. Left carotid system: Common carotid artery shows scattered plaque but is widely patent to the bifurcation. There is extensive dense calcified plaque at the  carotid bifurcation. Extensive dense calcification affecting ICA bulb results in near occlusion, luminal diameter less than 1 mm. ECA shows an origin stenosis with diameter of 1 mm. More distal cervical ICA is widely patent. Vertebral arteries: Calcified plaque at the right vertebral artery origin with stenosis of 50%. Left vertebral artery origin widely patent. Beyond the origins, both vertebral arteries appear normal through the cervical region to the foramen magnum. Skeleton: Ordinary cervical spondylosis. 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 Other neck: No lymphadenopathy. The salivary glands appear prominent. There is enlargement and heterogeneity of the thyroid gland. Upper chest: Lung apices are clear. Review of the MIP images confirms the above findings CTA HEAD FINDINGS Anterior circulation: Both internal carotid arteries are patent through the skull base and siphon regions. There is siphon atherosclerotic calcification with stenosis estimated at 50% on both sides. The anterior and middle cerebral vessels are patent. No large vessel occlusion. No proximal stenosis. More distal branch vessels do show ordinary atherosclerotic irregularity. Posterior circulation: Both vertebral arteries are patent through the foramen magnum to the basilar. No basilar stenosis. Posterior circulation branch vessels are patent. More distal branch vessels do show ordinary atherosclerotic irregularity. Venous sinuses: Patent and normal. Anatomic variants: None significant. Review of the MIP images confirms the above findings IMPRESSION: No intracranial large vessel occlusion or correctable proximal stenosis. Widespread distal vessel atherosclerotic irregularity. Extensive calcified plaque at the carotid bifurcation on the left with masslike calcified plaque within the ICA bulb resulting in near occlusion. Residual lumen is less than 1 mm. This represents 90% or  greater stenosis. Calcified plaque at the right carotid bifurcation but without measurable stenosis. 50% stenosis at the right vertebral artery origin. Electronically Signed   By: Nelson Chimes M.D.   On: 04/18/2021 14:21    EKG: Independently reviewed.  Sinus rhythm at 73 bpm  Assessment/Plan Transient Ischemia Attack vs.CVA: Acute.  Patient was last noted to be normal at 1200 prior to having  expressive aphasia signs of right-sided weakness.  Possibly contributing to his fall.  CT scan of the head noted no acute abnormality.  CTA noted significant calcified plaque of the left internal carotid artery near the bulb as a possible cause of symptoms.  He is not a tPA candidate given resolution of symptoms. -Admit to a telemetry bed -Neurochecks -Check lipid panel and hemoglobin A1c -Check MRI of the brain -Check echocardiogram -Aspirin -Plavix x 3 weeks -PT/OT to eval and -Appreciate neurology consultative services, we will follow-up for further recommendation  Symptomatic left internal carotid artery stenosis: Patient was noted to have a left carotid bruit during recent preoperative evaluation.  He had been scheduled for a carotid ultrasound today.  However CT angiogram of the head and neck noted extensive calcified plaque of the carotid bifurcation on the left with masslike plaque within the ICA bulb resulting in near occlusion. -Continue Plavix, aspirin, statin  Hypokalemia: Acute.  Potassium 3.4.  Patient has on diuretics which is likely related.  He is on scheduled potassium chloride to 30 mEq daily. -Give additional bolus of potassium chloride p.o.  Essential hypertension: Home blood pressure regimen includes amlodipine 10 mg daily, benazepril 40 mg daily, Coreg 12.5 mg twice daily, and hydrochlorothiazide 25 mg daily. -Continue home blood pressure regimen except for hydrochlorothiazide due to reports of dizziness prior to fall  Adenocarcinoma of the duodenum: Patient was found to have  colon cancer during EGD 02/2021.  Is yet to be scheduled for surgery with Dr. Barry Dienes. -Message sent to Dr. Barry Dienes with notification of admission into the hospital  Iron deficiency anemia: Hemoglobin 10.4 g/dL which appears near patient's baseline with MCV 75.9 and MCH 23.  Suspect related to blood loss with adenocarcinoma of the duodenum. -Continue to monitor  Fall at home: Patient reported  feeling lightheaded prior to falling. -Check orthostatic vital signs in a.m. -PT to evaluate  Hyperlipidemia: Home medication regimen includes atorvastatin 40 mg daily for -Follow-up lipid panel -Continue atorvastatin  History of colon and prostate cancer  GERD -Continue Protonix  DVT prophylaxis: lovenox Code Status: Full Family Communication: Family updated at bedside Disposition Plan: Hopefully discharge home once medically stable Consults called: neurology  Admission status: Observation  Norval Morton MD [facility]   If 7PM-7AM, please contact night-coverage   04/18/2021, 3:17 PM

## 2021-04-18 NOTE — ED Triage Notes (Signed)
Pt BIB [facility] for Code Stroke. Pt LSN at 1200 by wife, pt fell off bed and began having slurred speech and receptive and expressive aphasia. Pt had slight improvement in sx en route, no slurred speech on arrival to ED. Denies unilateral weakness or other neuro sx.   EMS VS- 158/68, SpO2 97% RA, CBG 178

## 2021-04-18 NOTE — ED Provider Notes (Addendum)
[facility] EMERGENCY DEPARTMENT Provider Note   CSN: [id number] Arrival date & time: 04/18/21  1311  An emergency department physician performed an initial assessment on this suspected stroke patient at 1312.  History  Chief Complaint  Patient presents with   Code Stroke    Aaron Moss is a 76 y.o. male.  HPI    76 y.o. male PMHx as reviewed below was last normal ~1200, when wife noticed that patient was having difficulty speaking. He had slight improvement in his speech en route however had according to EMS, patient had right upper extremity weakness, both of which had resolved by the time he arrived to the ER.  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  Patient also has history of hypertension, hyperlipidemia.  Home Medications Prior to Admission medications   Medication Sig Start Date End Date Taking? Authorizing Provider  amLODipine (NORVASC) 10 MG tablet TAKE 1 TABLET BY MOUTH ONCE DAILY 07/07/20  Yes Biagio Borg, MD  atorvastatin (LIPITOR) 40 MG tablet Take 1 tablet (40 mg total) by mouth daily. 07/07/20  Yes Biagio Borg, MD  benazepril (LOTENSIN) 40 MG tablet Take 1 tablet (40 mg total) by mouth daily. 07/07/20  Yes Biagio Borg, MD  carvedilol (COREG) 12.5 MG tablet Take 1 tablet (12.5 mg total) by mouth 2 (two) times daily. 04/13/21 07/12/21 Yes Lelon Perla, MD  hydrochlorothiazide (HYDRODIURIL) 25 MG tablet TAKE 1 TABLET BY MOUTH ONCE DAILY. Patient taking differently: Take 25 mg by mouth daily. 07/07/20  Yes Biagio Borg, MD  pantoprazole (PROTONIX) 40 MG tablet Take 1 tablet (40 mg total) by mouth daily. 02/16/21  Yes Biagio Borg, MD  potassium chloride (KLOR-CON) 10 MEQ tablet Take 3 tablets (30 mEq total) by mouth daily. 07/07/20  Yes Biagio Borg, MD  traMADol (ULTRAM) 50 MG tablet TAKE 1 TABLET BY MOUTH EVERY  6 HOURS AS NEEDED FOR PAIN Patient taking differently: Take 50 mg by mouth every 6 (six) hours as needed (pain). 11/12/20  Yes Biagio Borg, MD  Travoprost, BAK Free, (TRAVATAN) 0.004 % SOLN ophthalmic solution Place 1 drop into both eyes at bedtime.   Yes [provider]  VITAMIN D PO Take 1 tablet by mouth daily.   Yes [provider]  iron polysaccharides (NU-IRON) 150 MG capsule Take 1 capsule (150 mg total) by mouth 2 (two) times daily. Patient not taking: Reported on 04/18/2021 02/17/21   Biagio Borg, MD      Allergies    Lyrica [pregabalin]    Review of Systems   Review of Systems  Physical Exam Updated Vital Signs BP (!) 140/56    Pulse 65    Temp 98.6 F (37 C) (Oral)    Resp 12    Wt 100.4 kg    SpO2 100%    BMI 35.73 kg/m  Physical Exam Vitals and nursing note reviewed.  Constitutional:      Appearance: He is well-developed.  HENT:     Head: Atraumatic.  Eyes:     Extraocular Movements: Extraocular movements intact.     Pupils: Pupils are equal, round, and reactive to light.  Cardiovascular:     Rate and Rhythm: Normal rate.  Pulmonary:     Effort: Pulmonary effort is normal.  Musculoskeletal:     Cervical back: Neck supple.  Skin:  General: Skin is warm.  Neurological:     Mental Status: He is alert and oriented to person, place, and time.     Cranial Nerves: No cranial nerve deficit.     Sensory: No sensory deficit.     Motor: No weakness.     Coordination: Coordination normal.     Gait: Gait normal.    ED Results / Procedures / Treatments   Labs (all labs ordered are listed, but only abnormal results are displayed) Labs Reviewed  CBC - Abnormal; Notable for the following components:      Result Value   Hemoglobin 10.4 (*)    HCT 34.3 (*)    MCV 75.9 (*)    MCH 23.0 (*)    RDW 18.5 (*)    All other components within normal limits  COMPREHENSIVE METABOLIC PANEL - Abnormal; Notable for the following components:   Potassium 3.4  (*)    Glucose, Bld 106 (*)    Calcium 8.6 (*)    Total Protein 6.3 (*)    Albumin 3.0 (*)    GFR, Estimated 60 (*)    All other components within normal limits  I-STAT CHEM 8, ED - Abnormal; Notable for the following components:   Potassium 3.3 (*)    Glucose, Bld 105 (*)    Calcium, Ion 1.06 (*)    Hemoglobin 11.9 (*)    HCT 35.0 (*)    All other components within normal limits  PROTIME-INR  APTT  DIFFERENTIAL  HEMOGLOBIN A1C  LIPID PANEL  CBG MONITORING, ED    EKG EKG Interpretation  Date/Time:  Monday April 18 2021 13:43:13 EST Ventricular Rate:  73 PR Interval:  178 QRS Duration: 92 QT Interval:  432 QTC Calculation: 477 R Axis:   -27 Text Interpretation: Sinus rhythm Borderline left axis deviation Borderline prolonged QT interval No acute changes No significant change since last tracing Confirmed by Varney Biles [phone]) on 04/18/2021 3:03:47 PM  Radiology CT HEAD CODE STROKE WO CONTRAST  Result Date: 04/18/2021 CLINICAL DATA:  Code stroke.  Neuro deficit, acute, stroke suspected EXAM: CT HEAD WITHOUT CONTRAST TECHNIQUE: Contiguous axial images were obtained from the base of the skull through the vertex without intravenous contrast. RADIATION DOSE REDUCTION: This exam was performed according to the departmental dose-optimization program which includes automated exposure control, adjustment of the mA and/or kV according to patient size and/or use of iterative reconstruction technique. COMPARISON:  None. FINDINGS: Brain: There is no acute intracranial hemorrhage, mass effect, or edema. Gray-white differentiation is preserved. Patchy low-density in the supratentorial white matter is nonspecific but may reflect chronic microvascular ischemic changes. Ventricles and sulci are within normal limits in size and configuration. No extra-axial collection. Vascular: No hyperdense vessel. There is intracranial atherosclerotic calcification at the skull base. Skull: Unremarkable.  Sinuses/Orbits: No significant opacification. Orbits are unremarkable. Other: Mastoid air cells are clear. ASPECTS ([place] Stroke Program Early CT Score) - Ganglionic level infarction (caudate, lentiform nuclei, internal capsule, insula, M1-M3 cortex): 7 - Supraganglionic infarction (M4-M6 cortex): 3 Total score (0-10 with 10 being normal): 10 IMPRESSION: There is no acute intracranial hemorrhage or evidence of acute infarction. ASPECT score is 10. These results were communicated to Dr. Theda Sers at 1:27 pm on 04/18/2021 by text page via the [facility] messaging system. Electronically Signed   By: Macy Mis M.D.   On: 04/18/2021 13:35   CT ANGIO HEAD CODE STROKE  Result Date: 04/18/2021 CLINICAL DATA:  Neuro deficit, acute, stroke suspected. Transient expressive aphasia and  right upper extremity weakness. EXAM: CT ANGIOGRAPHY HEAD AND NECK TECHNIQUE: Multidetector CT imaging of the head and neck was performed using the standard protocol during bolus administration of intravenous contrast. Multiplanar CT image reconstructions and MIPs were obtained to evaluate the vascular anatomy. Carotid stenosis measurements (when applicable) are obtained utilizing NASCET criteria, using the distal internal carotid diameter as the denominator. RADIATION DOSE REDUCTION: This exam was performed according to the departmental dose-optimization program which includes automated exposure control, adjustment of the mA and/or kV according to patient size and/or use of iterative reconstruction technique. CONTRAST:  12mL OMNIPAQUE IOHEXOL 350 MG/ML SOLN COMPARISON:  Head CT earlier same day FINDINGS: CTA NECK FINDINGS Aortic arch: Aortic atherosclerosis. No aneurysm or dissection. Branching pattern is normal. Right carotid system: Common carotid artery shows scattered plaque but is widely patent to the bifurcation. Calcified plaque at the carotid bifurcation and ICA bulb. Minimal diameter of the proximal ICA is the same as the more distal  cervical ICA, therefore there is no stenosis. Left carotid system: Common carotid artery shows scattered plaque but is widely patent to the bifurcation. There is extensive dense calcified plaque at the carotid bifurcation. Extensive dense calcification affecting ICA bulb results in near occlusion, luminal diameter less than 1 mm. ECA shows an origin stenosis with diameter of 1 mm. More distal cervical ICA is widely patent. Vertebral arteries: Calcified plaque at the right vertebral artery origin with stenosis of 50%. Left vertebral artery origin widely patent. Beyond the origins, both vertebral arteries appear normal through the cervical region to the foramen magnum. Skeleton: Ordinary cervical spondylosis. 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 Other neck: No lymphadenopathy. The salivary glands appear prominent. There is enlargement and heterogeneity of the thyroid gland. Upper chest: Lung apices are clear. Review of the MIP images confirms the above findings CTA HEAD FINDINGS Anterior circulation: Both internal carotid arteries are patent through the skull base and siphon regions. There is siphon atherosclerotic calcification with stenosis estimated at 50% on both sides. The anterior and middle cerebral vessels are patent. No large vessel occlusion. No proximal stenosis. More distal branch vessels do show ordinary atherosclerotic irregularity. Posterior circulation: Both vertebral arteries are patent through the foramen magnum to the basilar. No basilar stenosis. Posterior circulation branch vessels are patent. More distal branch vessels do show ordinary atherosclerotic irregularity. Venous sinuses: Patent and normal. Anatomic variants: None significant. Review of the MIP images confirms the above findings IMPRESSION: No intracranial large vessel occlusion or correctable proximal stenosis. Widespread distal vessel atherosclerotic irregularity.  Extensive calcified plaque at the carotid bifurcation on the left with masslike calcified plaque within the ICA bulb resulting in near occlusion. Residual lumen is less than 1 mm. This represents 90% or greater stenosis. Calcified plaque at the right carotid bifurcation but without measurable stenosis. 50% stenosis at the right vertebral artery origin. Electronically Signed   By: Nelson Chimes M.D.   On: 04/18/2021 14:21   CT ANGIO NECK CODE STROKE  Result Date: 04/18/2021 CLINICAL DATA:  Neuro deficit, acute, stroke suspected. Transient expressive aphasia and right upper extremity weakness. EXAM: CT ANGIOGRAPHY HEAD AND NECK TECHNIQUE: Multidetector CT imaging of the head and neck was performed using the standard protocol during bolus administration of intravenous contrast. Multiplanar CT image reconstructions and MIPs were obtained to evaluate the vascular anatomy. Carotid stenosis measurements (when applicable) are obtained utilizing NASCET criteria, using the distal internal carotid diameter as the denominator. RADIATION DOSE REDUCTION: This  exam was performed according to the departmental dose-optimization program which includes automated exposure control, adjustment of the mA and/or kV according to patient size and/or use of iterative reconstruction technique. CONTRAST:  55mL OMNIPAQUE IOHEXOL 350 MG/ML SOLN COMPARISON:  Head CT earlier same day FINDINGS: CTA NECK FINDINGS Aortic arch: Aortic atherosclerosis. No aneurysm or dissection. Branching pattern is normal. Right carotid system: Common carotid artery shows scattered plaque but is widely patent to the bifurcation. Calcified plaque at the carotid bifurcation and ICA bulb. Minimal diameter of the proximal ICA is the same as the more distal cervical ICA, therefore there is no stenosis. Left carotid system: Common carotid artery shows scattered plaque but is widely patent to the bifurcation. There is extensive dense calcified plaque at the carotid  bifurcation. Extensive dense calcification affecting ICA bulb results in near occlusion, luminal diameter less than 1 mm. ECA shows an origin stenosis with diameter of 1 mm. More distal cervical ICA is widely patent. Vertebral arteries: Calcified plaque at the right vertebral artery origin with stenosis of 50%. Left vertebral artery origin widely patent. Beyond the origins, both vertebral arteries appear normal through the cervical region to the foramen magnum. Skeleton: Ordinary cervical spondylosis. 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 Other neck: No lymphadenopathy. The salivary glands appear prominent. There is enlargement and heterogeneity of the thyroid gland. Upper chest: Lung apices are clear. Review of the MIP images confirms the above findings CTA HEAD FINDINGS Anterior circulation: Both internal carotid arteries are patent through the skull base and siphon regions. There is siphon atherosclerotic calcification with stenosis estimated at 50% on both sides. The anterior and middle cerebral vessels are patent. No large vessel occlusion. No proximal stenosis. More distal branch vessels do show ordinary atherosclerotic irregularity. Posterior circulation: Both vertebral arteries are patent through the foramen magnum to the basilar. No basilar stenosis. Posterior circulation branch vessels are patent. More distal branch vessels do show ordinary atherosclerotic irregularity. Venous sinuses: Patent and normal. Anatomic variants: None significant. Review of the MIP images confirms the above findings IMPRESSION: No intracranial large vessel occlusion or correctable proximal stenosis. Widespread distal vessel atherosclerotic irregularity. Extensive calcified plaque at the carotid bifurcation on the left with masslike calcified plaque within the ICA bulb resulting in near occlusion. Residual lumen is less than 1 mm. This represents 90% or greater  stenosis. Calcified plaque at the right carotid bifurcation but without measurable stenosis. 50% stenosis at the right vertebral artery origin. Electronically Signed   By: Nelson Chimes M.D.   On: 04/18/2021 14:21    Procedures .Critical Care Performed by: Varney Biles, MD Authorized by: Varney Biles, MD   Critical care provider statement:    Critical care time (minutes):  34   Critical care was necessary to treat or prevent imminent or life-threatening deterioration of the following conditions:  CNS failure or compromise   Critical care was time spent personally by me on the following activities:  Development of treatment plan with patient or surrogate, discussions with consultants, evaluation of patient's response to treatment, examination of patient, ordering and review of laboratory studies, ordering and review of radiographic studies, ordering and performing treatments and interventions, pulse oximetry, re-evaluation of patient's condition and review of old charts    Medications Ordered in ED Medications   stroke: mapping our early stages of recovery book (has no administration in time range)  acetaminophen (TYLENOL) tablet 650 mg (has no administration in time range)  Or  acetaminophen (TYLENOL) 160 MG/5ML solution 650 mg (has no administration in time range)    Or  acetaminophen (TYLENOL) suppository 650 mg (has no administration in time range)  enoxaparin (LOVENOX) injection 40 mg (has no administration in time range)  aspirin EC tablet 81 mg (has no administration in time range)  clopidogrel (PLAVIX) tablet 75 mg (has no administration in time range)  sodium chloride flush (NS) 0.9 % injection 3 mL (3 mLs Intravenous Given 04/18/21 1332)  iohexol (OMNIPAQUE) 350 MG/ML injection 75 mL (75 mLs Intravenous Contrast Given 04/18/21 1338)    ED Course/ Medical Decision Making/ A&P Clinical Course as of 04/18/21 1612  Mon Apr 18, 2021  1611 CT angiograms reveals significant  stenosis of the carotid artery.  [AN]    Clinical Course User Index [AN] Varney Biles, MD                           Medical Decision Making Amount and/or Complexity of Data Reviewed Labs: ordered. Radiology: ordered.  Risk Decision regarding hospitalization.   This patient presents to the ED with chief complaint(s) of speech disturbance and right-sided weakness with pertinent past medical history of hypertension and hyperlipidemia and duodenal CA which further complicates the presenting complaint. The complaint involves an extensive differential diagnosis and treatment options and also carries with it a high risk of complications and morbidity.    The differential diagnosis includes acute ischemic stroke, subdural hematoma, metastatic lesion.  CODE STROKE is activated. The initial plan is to get stroke workup started.  Additional history obtained: Additional history obtained from EMS , patient's son. Also reviewed outpatient note from cardiology Dr. Stanford Breed which talks about getting carotid Dopplers.  Reassessment and review: Lab Tests: I Ordered, and personally interpreted labs.  The pertinent results include: Normal CBC and overall reassuring metabolic profile from emergency perspective.  Imaging Studies ordered: CT scan of the brain -which I independently reviewed, it is negative for any bleed. Neurology added CT angiogram head and neck.  Cardiac Monitoring: The patient was maintained on a cardiac monitor.  I personally viewed and interpreted the cardiac monitor which showed an underlying rhythm of:  sinus rhythm    Reevaluation of the patient after these medicines showed that the patient    resolved  Consultations Obtained: I requested consultation with the admitting physician hospitalist and consultant neurology , and discussed  findings as well as pertinent plan. Neurology team has seen the patient and recommend admission to medicine service. They do indicate  consulting neurovascular team for finding on CT angiogram which reveals left-sided clot burden. I requested medicine team to close the loop and consult vascular surgery.  Complexity of problems addressed: Patients presentation is most consistent with  acute presentation with potential threat to life or bodily function During patient's assessment  Disposition: After consideration of the diagnostic results and the patients response to treatment,  I feel that the patent would benefit from admission   .     Final Clinical Impression(s) / ED Diagnoses Final diagnoses:  TIA (transient ischemic attack)  Occlusion of carotid artery, unspecified laterality    Rx / DC Orders ED Discharge Orders     None         Varney Biles, MD 04/18/21 [place], Josiel Gahm, MD 04/18/21 1612

## 2021-04-18 NOTE — Consult Note (Signed)
Hospital Consult    Reason for Consult:  Symptomatic left carotid stenosis Referring Physician:  Hospitalist MRN #:  [id number]  History of Present Illness: This is a 76 y.o. male with history of hypertension, hyperlipidemia, prostate cancer, colon cancer, and recently diagnosed duodenal adenocarcinoma that vascular surgery has been consulted for a high-grade calcified left internal carotid stenosis.  Patient had a fall at home earlier today and then had some dysphagia.  When EMS arrived he had some right upper extremity weakness and a code stroke was called.  Patient states he had another event around Christmas day where he had some tingling in his right hand that resolved.  He has had no previous history of neck surgery or neck radiation.  States he is scheduled undergo surgery with Dr. Barry Dienes for his duodenal adenocarcinoma but this has not been scheduled.  He was seen by cardiology last week and scheduled for carotid Dopplers.  He was previously on aspirin but this was stopped recently due to anemia.  He is now back to his baseline and reports no neurologic deficits.  He reports he has lost 30 pounds.  Albumin is 3.0.  He has excellent functional status at baseline and was exercising 3 times a week.  Past Medical History:  Diagnosis Date   Colon cancer ([place]) 2000   Diverticulosis of colon    GERD (gastroesophageal reflux disease)    Glaucoma    History of chemotherapy 2000   colon cancer   History of radiation therapy 08/06/13- 10/03/13   prostate 7800 cGy 40 sessions, seminal vesicles 5600 cGy 40 sessions   Hyperlipidemia    Hypertension    Left knee DJD    Prostate cancer ([place]) 05/23/2013   gleason 3+4=7, volume 11.5 ml    Past Surgical History:  Procedure Laterality Date   COLON SURGERY  2000   colon cancer   COLONOSCOPY     EUS     pancreatic cyst   KNEE ARTHROSCOPY  2007   LT- [facility]   PROSTATE BIOPSY  05/23/13   gleason 7, volume 11.5 ml    Allergies  Allergen  Reactions   Lyrica [Pregabalin] Other (See Comments)    "Just made me feel bad"    Prior to Admission medications   Medication Sig Start Date End Date Taking? Authorizing Provider  amLODipine (NORVASC) 10 MG tablet TAKE 1 TABLET BY MOUTH ONCE DAILY 07/07/20  Yes Biagio Borg, MD  atorvastatin (LIPITOR) 40 MG tablet Take 1 tablet (40 mg total) by mouth daily. 07/07/20  Yes Biagio Borg, MD  benazepril (LOTENSIN) 40 MG tablet Take 1 tablet (40 mg total) by mouth daily. 07/07/20  Yes Biagio Borg, MD  carvedilol (COREG) 12.5 MG tablet Take 1 tablet (12.5 mg total) by mouth 2 (two) times daily. 04/13/21 07/12/21 Yes Lelon Perla, MD  hydrochlorothiazide (HYDRODIURIL) 25 MG tablet TAKE 1 TABLET BY MOUTH ONCE DAILY. Patient taking differently: Take 25 mg by mouth daily. 07/07/20  Yes Biagio Borg, MD  pantoprazole (PROTONIX) 40 MG tablet Take 1 tablet (40 mg total) by mouth daily. 02/16/21  Yes Biagio Borg, MD  potassium chloride (KLOR-CON) 10 MEQ tablet Take 3 tablets (30 mEq total) by mouth daily. 07/07/20  Yes Biagio Borg, MD  traMADol (ULTRAM) 50 MG tablet TAKE 1 TABLET BY MOUTH EVERY 6 HOURS AS NEEDED FOR PAIN Patient taking differently: Take 50 mg by mouth every 6 (six) hours as needed (pain). 11/12/20  Yes Cathlean Cower  W, MD  Travoprost, BAK Free, (TRAVATAN) 0.004 % SOLN ophthalmic solution Place 1 drop into both eyes at bedtime.   Yes [provider]  VITAMIN D PO Take 1 tablet by mouth daily.   Yes [provider]  iron polysaccharides (NU-IRON) 150 MG capsule Take 1 capsule (150 mg total) by mouth 2 (two) times daily. Patient not taking: Reported on 04/18/2021 02/17/21   Biagio Borg, MD    Social History   Socioeconomic History   Marital status: Married    Spouse name: Not on file   Number of children: 4   Years of education: Not on file   Highest education level: Not on file  Occupational History   Not on file  Tobacco Use   Smoking status: Former     Packs/day: 1.00    Years: 22.00    Pack years: 22.00    Types: Cigarettes    Quit date: 03/20/1988    Years since quitting: 33.1   Smokeless tobacco: Never  Vaping Use   Vaping Use: Never used  Substance and Sexual Activity   Alcohol use: Not Currently    Comment: rare   Drug use: No   Sexual activity: Not Currently  Other Topics Concern   Not on file  Social History Narrative   Not on file   Social Determinants of Health   Financial Resource Strain: Low Risk    Difficulty of Paying Living Expenses: Not hard at all  Food Insecurity: No Food Insecurity   Worried About Charity fundraiser in the Last Year: Never true   [place] in the Last Year: Never true  Transportation Needs: No Transportation Needs   Lack of Transportation (Medical): No   Lack of Transportation (Non-Medical): No  Physical Activity: Sufficiently Active   Days of Exercise per Week: 3 days   Minutes of Exercise per Session: 60 min  Stress: No Stress Concern Present   Feeling of Stress : Not at all  Social Connections: Socially Integrated   Frequency of Communication with Friends and Family: More than three times a week   Frequency of Social Gatherings with Friends and Family: More than three times a week   Attends Religious Services: 1 to 4 times per year   Active Member of [employer] or Organizations: Yes   Attends Archivist Meetings: 1 to 4 times per year   Marital Status: Married  Human resources officer Violence: Not on file     Family History  Problem Relation Age of Onset   Cancer Mother        Liver? bladder?, dx. late 46s   Colon cancer Sister 69   Cancer Brother        Lung   Diabetes Brother    Cancer Maternal Aunt        unknown type   Breast cancer Maternal Grandmother        dx. >50   Cancer Daughter 22       colon   Esophageal cancer Neg Hx    Rectal cancer Neg Hx    Stomach cancer Neg Hx     ROS: [x]  Positive   [ ]  Negative   [ ]  All sytems reviewed and are  negative  Cardiovascular: []  chest pain/pressure []  palpitations []  SOB lying flat []  DOE []  pain in legs while walking []  pain in legs at rest []  pain in legs at night []  non-healing ulcers []  hx of DVT []  swelling in  legs  Pulmonary: []  productive cough []  asthma/wheezing []  home O2  Neurologic: []  weakness in []  arms []  legs []  numbness in []  arms []  legs []  hx of CVA []  mini stroke [] difficulty speaking or slurred speech []  temporary loss of vision in one eye []  dizziness  Hematologic: []  hx of cancer []  bleeding problems []  problems with blood clotting easily  Endocrine:   []  diabetes []  thyroid disease  GI []  vomiting blood []  blood in stool  GU: []  CKD/renal failure []  HD--[]  M/W/F or []  T/T/S []  burning with urination []  blood in urine  Psychiatric: []  anxiety []  depression  Musculoskeletal: []  arthritis []  joint pain  Integumentary: []  rashes []  ulcers  Constitutional: []  fever []  chills   Physical Examination  Vitals:   04/18/21 1615 04/18/21 1630  BP: (!) 145/54 (!) 129/46  Pulse: 62 65  Resp: 16 12  Temp:    SpO2: 99% 100%   Body mass index is 35.73 kg/m.  General:  NAD HENT: WNL, normocephalic Pulmonary: normal non-labored breathing Cardiac: regular, without  Murmurs, rubs or gallops Abdomen:  soft, NT/ND Vascular Exam/Pulses: No previous neck incisions 2+ radial pulses bilateral upper extremities Extremities: without ischemic changes Musculoskeletal: no muscle wasting or atrophy  Neurologic: A&O X 3; Appropriate Affect ; SENSATION: normal; MOTOR FUNCTION:  moving all extremities equally. Speech is fluent/normal.  Cranial nerves II through XII grossly intact.   CBC    Component Value Date/Time   WBC 8.7 04/18/2021 1315   RBC 4.52 04/18/2021 1315   HGB 11.9 (L) 04/18/2021 1321   HGB 10.0 (L) 04/07/2021 1514   HCT 35.0 (L) 04/18/2021 1321   PLT 263 04/18/2021 1315   PLT 313 04/07/2021 1514   MCV 75.9 (L)  04/18/2021 1315   MCH 23.0 (L) 04/18/2021 1315   MCHC 30.3 04/18/2021 1315   RDW 18.5 (H) 04/18/2021 1315   LYMPHSABS 1.6 04/18/2021 1315   MONOABS 0.9 04/18/2021 1315   EOSABS 0.1 04/18/2021 1315   BASOSABS 0.0 04/18/2021 1315    BMET    Component Value Date/Time   NA 139 04/18/2021 1321   K 3.3 (L) 04/18/2021 1321   CL 103 04/18/2021 1321   CO2 26 04/18/2021 1315   GLUCOSE 105 (H) 04/18/2021 1321   BUN 16 04/18/2021 1321   CREATININE 1.20 04/18/2021 1321   CREATININE 1.18 04/07/2021 1514   CALCIUM 8.6 (L) 04/18/2021 1315   GFRNONAA 60 (L) 04/18/2021 1315   GFRNONAA >60 04/07/2021 1514   GFRAA >60 02/24/2019 0207    COAGS: Lab Results  Component Value Date   INR 1.1 04/18/2021     Non-Invasive Vascular Imaging:    CTA neck reviewed and agree he has a high-grade calcified left internal carotid artery stenosis greater than 90%   ASSESSMENT/PLAN: This is a 76 y.o. male  with history of hypertension, hyperlipidemia, prostate cancer, colon cancer, and recently diagnosed duodenal adenocarcinoma that vascular surgery has been consulted for a high-grade calcified left internal carotid stenosis >90% with TIA.  I have recommended left carotid revascularization after review of his CTA and agree this is likely the etiology for his slurred speech and right upper extremity weakness.  I worry about his tolerance for dual antiplatelet therapy with duodenal adenocarcinoma and his need for surgical intervention on his duodenal cancer and in addition the lesion is very calcified that could making stenting difficult.  I have recommended left carotid endarterectomy.  I will put him on the schedule for Monday 04/25/21 with  me.  He can stay on dual antiplatelet therapy perioperative from my standpoint.  I discussed risk and benefits of surgery including risk of 1% perioperative stroke, bleeding, infection, hematoma, risk of anesthesia etc. Will allow him to complete his MRI, echocardiogram, and  further stroke work-up.  Vascular will follow.  Marty Heck, MD [facility] Office: [place]

## 2021-04-19 ENCOUNTER — Observation Stay (HOSPITAL_BASED_OUTPATIENT_CLINIC_OR_DEPARTMENT_OTHER): Payer: Medicare Other

## 2021-04-19 DIAGNOSIS — W19XXXA Unspecified fall, initial encounter: Secondary | ICD-10-CM | POA: Diagnosis not present

## 2021-04-19 DIAGNOSIS — I10 Essential (primary) hypertension: Secondary | ICD-10-CM

## 2021-04-19 DIAGNOSIS — R55 Syncope and collapse: Secondary | ICD-10-CM

## 2021-04-19 DIAGNOSIS — G459 Transient cerebral ischemic attack, unspecified: Secondary | ICD-10-CM

## 2021-04-19 DIAGNOSIS — I6522 Occlusion and stenosis of left carotid artery: Secondary | ICD-10-CM | POA: Diagnosis not present

## 2021-04-19 DIAGNOSIS — D500 Iron deficiency anemia secondary to blood loss (chronic): Secondary | ICD-10-CM | POA: Diagnosis not present

## 2021-04-19 DIAGNOSIS — C170 Malignant neoplasm of duodenum: Secondary | ICD-10-CM | POA: Diagnosis not present

## 2021-04-19 LAB — ECHOCARDIOGRAM COMPLETE
Area-P 1/2: 3.65 cm2
Calc EF: 60.9 %
Height: 66 in
S' Lateral: 3.5 cm
Single Plane A2C EF: 60.9 %
Single Plane A4C EF: 61.9 %
Weight: 3520 oz

## 2021-04-19 LAB — CBC
HCT: 31.6 % — ABNORMAL LOW (ref 39.0–52.0)
Hemoglobin: 9.7 g/dL — ABNORMAL LOW (ref 13.0–17.0)
MCH: 22.7 pg — ABNORMAL LOW (ref 26.0–34.0)
MCHC: 30.7 g/dL (ref 30.0–36.0)
MCV: 73.8 fL — ABNORMAL LOW (ref 80.0–100.0)
Platelets: 263 10*3/uL (ref 150–400)
RBC: 4.28 MIL/uL (ref 4.22–5.81)
RDW: 18.3 % — ABNORMAL HIGH (ref 11.5–15.5)
WBC: 6.5 10*3/uL (ref 4.0–10.5)
nRBC: 0 % (ref 0.0–0.2)

## 2021-04-19 LAB — RENAL FUNCTION PANEL
Albumin: 2.6 g/dL — ABNORMAL LOW (ref 3.5–5.0)
Anion gap: 8 (ref 5–15)
BUN: 8 mg/dL (ref 8–23)
CO2: 28 mmol/L (ref 22–32)
Calcium: 8.6 mg/dL — ABNORMAL LOW (ref 8.9–10.3)
Chloride: 104 mmol/L (ref 98–111)
Creatinine, Ser: 0.98 mg/dL (ref 0.61–1.24)
GFR, Estimated: >60 mL/min (ref >60)
Glucose, Bld: 99 mg/dL (ref 70–99)
Phosphorus: 3.2 mg/dL (ref 2.5–4.6)
Potassium: 2.6 mmol/L — CL (ref 3.5–5.1)
Sodium: 140 mmol/L (ref 135–145)

## 2021-04-19 LAB — MAGNESIUM: Magnesium: 1.7 mg/dL (ref 1.7–2.4)

## 2021-04-19 LAB — POTASSIUM
Potassium: 2.7 mmol/L — CL (ref 3.5–5.1)
Potassium: 4 mmol/L (ref 3.5–5.1)

## 2021-04-19 MED ORDER — CARVEDILOL 6.25 MG PO TABS: 6.2500 mg | ORAL_TABLET | Freq: Two times a day (BID) | ORAL | 0 refills | Status: DC

## 2021-04-19 MED ORDER — POTASSIUM CHLORIDE CRYS ER 20 MEQ PO TBCR
40.0000 meq | EXTENDED_RELEASE_TABLET | ORAL | Status: AC
  Administered 2021-04-19 (×3): 40 meq via ORAL
  Filled 2021-04-19 (×3): qty 2

## 2021-04-19 MED ORDER — SPIRONOLACTONE 25 MG PO TABS
25.0000 mg | ORAL_TABLET | Freq: Once | ORAL | Status: AC
  Administered 2021-04-19: 25 mg via ORAL
  Filled 2021-04-19: qty 1

## 2021-04-19 MED ORDER — ASPIRIN 81 MG PO TBEC: 81.0000 mg | DELAYED_RELEASE_TABLET | Freq: Every day | ORAL | 11 refills | Status: DC

## 2021-04-19 MED ORDER — BENAZEPRIL HCL 40 MG PO TABS: 40.0000 mg | ORAL_TABLET | Freq: Every day | ORAL | 3 refills | Status: DC

## 2021-04-19 MED ORDER — CLOPIDOGREL BISULFATE 75 MG PO TABS: 75.0000 mg | ORAL_TABLET | Freq: Every day | ORAL | 0 refills | Status: DC

## 2021-04-19 MED ORDER — CARVEDILOL 6.25 MG PO TABS: 6.2500 mg | ORAL_TABLET | Freq: Two times a day (BID) | ORAL | Status: DC

## 2021-04-19 MED ORDER — MAGNESIUM SULFATE 2 GM/50ML IV SOLN
2.0000 g | Freq: Once | INTRAVENOUS | Status: AC
  Administered 2021-04-19: 2 g via INTRAVENOUS
  Filled 2021-04-19: qty 50

## 2021-04-19 MED ORDER — AMLODIPINE BESYLATE 10 MG PO TABS: ORAL_TABLET | ORAL | 3 refills | Status: DC

## 2021-04-19 NOTE — ED Notes (Signed)
Blood work sent w/ RN transporter d/t labels not printing in ED.

## 2021-04-19 NOTE — Progress Notes (Signed)
Carotid artery duplex completed. Refer to "CV Proc" under chart review to view preliminary results.  04/19/2021 11:50 AM Kelby Aline., MHA, RVT, RDCS, RDMS

## 2021-04-19 NOTE — Discharge Summary (Signed)
Physician Discharge Summary  Aaron Moss JKK:[id number] DOB: 1945/05/15 DOA: 04/18/2021  PCP: Biagio Borg, MD  Admit date: 04/18/2021 Discharge date: 04/19/2021 Admitted From: Home Disposition: Home Recommendations for Outpatient Follow-up:  Follow ups as below. Please obtain CBC/BMP/Mag at follow up Please follow up on the following pending results: None Home Health: Not indicated Equipment/Devices: Cane. Discharge Condition: Stable CODE STATUS: Full code  Follow-up Information     [facility]. Schedule an appointment as soon as possible for a visit in 4 week(s).   Specialty: Neurology Contact information: [address] [place]        Biagio Borg, MD. Schedule an appointment as soon as possible for a visit in 1 week(s).   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  Symptoms resolved in ED. CT head without acute finding.   Patient was not a candidate for tPA due to resolution of symptoms and malignancy.  MRI brain showed scattered punctate acute infarcts in the left ACA-PCA watershed territory. CTA head and neck noted severe left ICA stenosis with masslike calcification of the left IC bulb causing near complete occlusion.   Vascular surgery consulted, and planning carotid endarterectomy on 04/25/2020 either inpatient or outpatient.  TTE basically normal.  LDL 53.  A1c 5.1%.  Neurology recommended overnight sleep study but patient prefers to have this done outpatient.  Neurology recommended  DAPT with Plavix and aspirin for 3 weeks, followed by aspirin alone.  Neurology also recommended permissive hypertension, and normalizing blood pressure slowly.  See individual problem list below for more on hospital course.  Discharge Diagnoses:  Acute left ACA/PCA CVA-presented with transient lightheadedness, slurred speech and expressive aphasia.  CT head, CTA head and neck and MRI brain as above.  TTE without significant finding.  LDL 53.  A1c 5.1.  Currently asymptomatic. -Resume antihypertensive meds one at a time over the next few days to allow permissive hypertension. -Neurology recommended Plavix and aspirin for 3 weeks followed by aspirin alone -Continue Lipitor. -Recommend outpatient sleep study -Carotid endarterectomy planned for 04/25/2021.   Symptomatic severe left ICA stenosis-likely cause of his CVA and symptoms. -Plan for endarterectomy by vascular surgery on 04/25/2021 -Statin, Plavix and aspirin as above -Antihypertensive meds as above.   Duodenal cancer-recent diagnosis. -Outpatient follow-up with surgery and oncology   Essential hypertension: SBP in 140s.  -Would benefit from higher than normal SBP -Discontinued HCTZ and KCl -Decreased home Coreg to 6.25 mg -Start amlodipine in 3 days, and benazepril after endarterectomy -Would benefit from outpatient sleep study.   Hypokalemia: Likely due to HCTZ.  Replenished and resolved.   Iron deficiency anemia: H&H at baseline.  Could have some GI bleed from cancer            Recent Labs    07/07/20 1119 11/16/20 1216 02/16/21 1504 03/16/21 0953 04/04/21 1210 04/07/21 1514 04/18/21 1315 04/18/21 1321 04/19/21 0800  HGB 12.7* 11.6* 9.6* 10.6* 10.9* 10.0* 10.4* 11.9* 9.7*  -Recheck CBC at follow-up   Near syncope/fall at home-likely due to #1 and ICA stenosis. -Management  as above.   Class II obesity Body mass index is 35.51 kg/m.           Discharge Exam: Vitals:   04/19/21 0800 04/19/21 0826 04/19/21  1121 04/19/21 1556  BP:  (!) 148/63  (!) 142/61  Pulse:  64  70  Temp: 98.6 F (37 C) 99.1 F (37.3 C) 98.1 F (36.7 C) 99.1 F (37.3 C)  Resp:  14  15  Height:      Weight:      SpO2:  100% 100% 100%  TempSrc: Oral Oral Oral Oral  BMI (Calculated):         GENERAL: No apparent distress.  Nontoxic. HEENT: MMM.  Vision and hearing grossly intact.  NECK: Supple.  No apparent JVD.  RESP: Under percent on RA.  No IWOB.  Fair aeration bilaterally. CVS:  RRR. Heart sounds normal.  ABD/GI/GU: Bowel sounds present. Soft. Non tender.  MSK/EXT:  Moves extremities. No apparent deformity. No edema.  SKIN: no apparent skin lesion or wound NEURO: Awake and alert.  Oriented appropriately.  No apparent focal neuro deficit. PSYCH: Calm. Normal affect.   Discharge Instructions  Discharge Instructions     Diet - low sodium heart healthy   Complete by: As directed    Discharge instructions   Complete by: As directed    It has been a pleasure taking care of you!  You were hospitalized due to acute stroke likely from left carotid artery stenosis (narrowing of the artery in the left neck).  We have started you on Plavix and aspirin until you have the surgery done on this blood vessel by vascular surgery on 04/25/2021.  We also made some adjustment to your blood pressure medications.  Please review your new medication list and the directions on your medications before you take them.   Take care,   Increase activity slowly   Complete by: As directed       Allergies as of 04/19/2021       Reactions   Lyrica [pregabalin] Other (See Comments)   "Just made me feel bad"        Medication List     STOP taking these medications    hydrochlorothiazide 25 MG tablet Commonly known as: HYDRODIURIL   potassium chloride 10 MEQ tablet Commonly known as: KLOR-CON       TAKE these medications    amLODipine 10 MG tablet Commonly known as: NORVASC TAKE 1 TABLET BY MOUTH ONCE DAILY Start  taking on: April 22, 2021 What changed: These instructions start on April 22, 2021. If you are unsure what to do until then, ask your doctor or other care provider.   aspirin 81 MG EC tablet Take 1 tablet (81 mg total) by mouth daily. Swallow whole. Start taking on: April 20, 2021   atorvastatin 40 MG tablet Commonly known as: LIPITOR Take 1 tablet (40 mg total) by mouth daily.   benazepril 40 MG tablet Commonly known as: LOTENSIN Take 1 tablet (40 mg total) by mouth daily. Start taking on: April 26, 2021 What changed: These instructions start on April 26, 2021. If you are unsure what to do until then, ask your doctor or other care provider.   carvedilol 6.25 MG tablet Commonly known as: COREG Take 1 tablet (6.25 mg total) by mouth 2 (two) times daily. What changed:  medication strength how much to take   clopidogrel 75 MG tablet Commonly known as: PLAVIX Take 1 tablet (75 mg total) by mouth daily.  Start taking on: April 20, 2021   iron polysaccharides 150 MG capsule Commonly known as: Nu-Iron Take 1 capsule (150 mg total) by mouth 2 (two) times daily.   pantoprazole 40 MG tablet Commonly known as: PROTONIX Take 1 tablet (40 mg total) by mouth daily.   traMADol 50 MG tablet Commonly known as: ULTRAM TAKE 1 TABLET BY MOUTH EVERY 6 HOURS AS NEEDED FOR PAIN What changed:  reasons to take this additional instructions   Travoprost (BAK Free) 0.004 % Soln ophthalmic solution Commonly known as: TRAVATAN Place 1 drop into both eyes at bedtime.   VITAMIN D PO Take 1 tablet by mouth daily.               Durable Medical Equipment  (From admission, onward)           Start     Ordered   04/19/21 1420  For home use only DME Cane  Once        04/19/21 1419            Consultations: Neurology Vascular surgery  Procedures/Studies:   MR BRAIN WO CONTRAST  Result Date: 04/18/2021 CLINICAL DATA:  TIA, follow-up, slurred speech and aphasia  EXAM: MRI HEAD WITHOUT CONTRAST TECHNIQUE: Multiplanar, multiecho pulse sequences of the brain and surrounding structures were obtained without intravenous contrast. COMPARISON:  No prior MRI, correlation is made with CT and CTA head neck 04/18/2021 FINDINGS: Brain: Scattered punctate foci of restricted diffusion with ADC correlates in the left frontal and parietal lobes, in the ACA-PCA watershed territory (series 5, images 86-91). No other areas of restricted diffusion. No acute hemorrhage, mass, mass effect, or midline shift. No hydrocephalus or extra-axial collection. No foci of hemosiderin deposition to suggest remote hemorrhage. Scattered and confluent T2 hyperintense signal in the periventricular white matter, likely the sequela of moderate chronic small vessel ischemic disease. Vascular: Normal flow voids. Skull and upper cervical spine: Normal marrow signal. Sinuses/Orbits: Negative. Other: None. IMPRESSION: Scattered punctate acute infarcts in the left ACA-PCA watershed territory. Electronically Signed   By: Merilyn Baba M.D.   On: 04/18/2021 19:07   CT CHEST ABDOMEN PELVIS W CONTRAST  Result Date: 04/02/2021 CLINICAL DATA:  Evaluate for metastatic disease, new diagnosis duodenal adenocarcinoma EXAM: CT CHEST, ABDOMEN, AND PELVIS WITH CONTRAST TECHNIQUE: Multidetector CT imaging of the chest, abdomen and pelvis was performed following the standard protocol during bolus administration of intravenous contrast. RADIATION DOSE REDUCTION: This exam was performed according to the departmental dose-optimization program which includes automated exposure control, adjustment of the mA and/or kV according to patient size and/or use of iterative reconstruction technique. CONTRAST:  140mL ISOVUE-300 IOPAMIDOL (ISOVUE-300) INJECTION 61%, additional oral enteric contrast COMPARISON:  CT chest angiogram, 03/23/2015, CT abdomen pelvis, 05/04/2009 FINDINGS: CT CHEST FINDINGS Cardiovascular: Aortic atherosclerosis.  Normal heart size. Three-vessel coronary artery calcifications and/or stents. No pericardial effusion. Mediastinum/Nodes: No enlarged mediastinal, hilar, or axillary lymph nodes. Thyroid gland, trachea, and esophagus demonstrate no significant findings. Lungs/Pleura: Background of very fine centrilobular pulmonary nodules, most concentrated in the lung apices. No pleural effusion or pneumothorax. Musculoskeletal: No chest wall mass or suspicious osseous lesions identified. Incidental benign lipoma within the right teres major muscle body (series 2, image 13). CT ABDOMEN PELVIS FINDINGS Hepatobiliary: No solid liver abnormality is seen. No gallstones, gallbladder wall thickening, or biliary dilatation. Pancreas: Fluid attenuation lesion within the proximal pancreatic body measuring 1.9 x 1.4 cm, with prominence of the pancreatic duct distally, up to 0.6 cm (series 2, image 53).  This lesion was present on remote prior examination dated 05/04/2009, and has slightly increased in size over a long period of time, measuring up to 1.2 x 0.7 cm previously. Spleen: Normal in size without significant abnormality. Adrenals/Urinary Tract: Adrenal glands are unremarkable. Kidneys are normal, without renal calculi, solid lesion, or hydronephrosis. Thickening of the decompressed urinary bladder, likely related to chronic outlet obstruction. Stomach/Bowel: Stomach is within normal limits. Circumferential wall thickening of the first portion of the duodenum (series 2, image 56). The descending and more distal portions of the duodenum are normal. Appendix appears normal. No other evidence of bowel wall thickening, distention, or inflammatory changes. Colonic diverticulosis. Vascular/Lymphatic: Aortic atherosclerosis. Enlarged, hypodense lymph node anterior to the pancreatic head measuring 2.8 x 2.2 cm (series 2, image 60). Additional enlarged, hypodense portacaval node measuring up to 4.6 x 2.6 cm (series 2, image 56). Reproductive:  Prostate brachytherapy. Other: No abdominal wall hernia or abnormality. No ascites. Surgical clips in the left upper quadrant. Musculoskeletal: No acute osseous findings. IMPRESSION: 1. Circumferential wall thickening of the first portion of the duodenum, in keeping with known primary malignancy. 2. Enlarged, hypodense lymph nodes anterior to the pancreatic head and in the portacaval station, concerning for nodal metastatic disease. 3. No other evidence of metastatic disease in the chest, abdomen, or pelvis. 4. Incidental note of a fluid attenuation lesion within the proximal pancreatic body measuring 1.9 x 1.4 cm, with prominence of the pancreatic duct distally, up to 0.6 cm. This lesion was present on remote prior examination dated 05/04/2009, and has slightly increased in size over a long period of time. This is consistent with a small IPMN and benign given indolent behavior over greater than 10 years. 5. Background of very fine centrilobular pulmonary nodules, most concentrated in the lung apices, most commonly seen in smoking-related respiratory bronchiolitis. 6. Coronary artery disease. Aortic Atherosclerosis (ICD10-I70.0). Electronically Signed   By: Delanna Ahmadi M.D.   On: 04/02/2021 15:58   ECHOCARDIOGRAM COMPLETE  Result Date: 04/19/2021    ECHOCARDIOGRAM REPORT   Patient Name:   Aaron Moss Date of Exam: 04/19/2021 Medical Rec #:  [id number]       Height:       66.0 in Accession #:    [id number]      Weight:       220.0 lb Date of Birth:  11-Jan-1946       BSA:          2.082 m Patient Age:    21 years        BP:           148/63 mmHg Patient Gender: M               HR:           65 bpm. Exam Location:  Inpatient Procedure: 2D Echo, Cardiac Doppler and Color Doppler Indications:    TIA G45.9  History:        Patient has no prior history of Echocardiogram examinations.                 Risk Factors:Hypertension and Dyslipidemia.  Sonographer:    Bernadene Person RDCS Referring Phys: [id number] RONDELL A  SMITH IMPRESSIONS  1. Left ventricular ejection fraction, by estimation, is 65 to 70%. The left ventricle has normal function. The left ventricle has no regional wall motion abnormalities. Left ventricular diastolic parameters were normal.  2. Right ventricular systolic function is normal. The right ventricular size is normal. There  is normal pulmonary artery systolic pressure.  3. The mitral valve is normal in structure. No evidence of mitral valve regurgitation. No evidence of mitral stenosis.  4. The aortic valve is normal in structure. Aortic valve regurgitation is not visualized. No aortic stenosis is present.  5. The inferior vena cava is normal in size with greater than 50% respiratory variability, suggesting right atrial pressure of 3 mmHg. Conclusion(s)/Recommendation(s): No intracardiac source of embolism detected on this transthoracic study. Consider a transesophageal echocardiogram to exclude cardiac source of embolism if clinically indicated. FINDINGS  Left Ventricle: Left ventricular ejection fraction, by estimation, is 65 to 70%. The left ventricle has normal function. The left ventricle has no regional wall motion abnormalities. The left ventricular internal cavity size was normal in size. There is  no left ventricular hypertrophy. Left ventricular diastolic parameters were normal. Right Ventricle: The right ventricular size is normal. No increase in right ventricular wall thickness. Right ventricular systolic function is normal. There is normal pulmonary artery systolic pressure. The tricuspid regurgitant velocity is 2.33 m/s, and  with an assumed right atrial pressure of 3 mmHg, the estimated right ventricular systolic pressure is 24.2 mmHg. Left Atrium: Left atrial size was normal in size. Right Atrium: Right atrial size was normal in size. Pericardium: There is no evidence of pericardial effusion. Mitral Valve: The mitral valve is normal in structure. No evidence of mitral valve regurgitation. No  evidence of mitral valve stenosis. Tricuspid Valve: The tricuspid valve is normal in structure. Tricuspid valve regurgitation is trivial. No evidence of tricuspid stenosis. Aortic Valve: The aortic valve is normal in structure. Aortic valve regurgitation is not visualized. No aortic stenosis is present. Pulmonic Valve: The pulmonic valve was normal in structure. Pulmonic valve regurgitation is not visualized. No evidence of pulmonic stenosis. Aorta: The aortic root is normal in size and structure. Venous: The inferior vena cava is normal in size with greater than 50% respiratory variability, suggesting right atrial pressure of 3 mmHg. IAS/Shunts: No atrial level shunt detected by color flow Doppler.  LEFT VENTRICLE PLAX 2D LVIDd:         5.10 cm     Diastology LVIDs:         3.50 cm     LV e' medial:    8.18 cm/s LV PW:         1.00 cm     LV E/e' medial:  13.8 LV IVS:        1.10 cm     LV e' lateral:   9.15 cm/s LVOT diam:     2.00 cm     LV E/e' lateral: 12.3 LV SV:         86 LV SV Index:   41 LVOT Area:     3.14 cm  LV Volumes (MOD) LV vol d, MOD A2C: 90.8 ml LV vol d, MOD A4C: 61.2 ml LV vol s, MOD A2C: 35.5 ml LV vol s, MOD A4C: 23.3 ml LV SV MOD A2C:     55.3 ml LV SV MOD A4C:     61.2 ml LV SV MOD BP:      45.2 ml RIGHT VENTRICLE RV S prime:     14.30 cm/s TAPSE (M-mode): 1.8 cm LEFT ATRIUM             Index        RIGHT ATRIUM           Index LA diam:        3.60  cm 1.73 cm/m   RA Area:     14.60 cm LA Vol (A2C):   50.7 ml 24.35 ml/m  RA Volume:   36.90 ml  17.72 ml/m LA Vol (A4C):   41.3 ml 19.83 ml/m LA Biplane Vol: 46.6 ml 22.38 ml/m  AORTIC VALVE LVOT Vmax:   129.00 cm/s LVOT Vmean:  74.700 cm/s LVOT VTI:    0.273 m  AORTA Ao Root diam: 3.10 cm Ao Asc diam:  3.50 cm MITRAL VALVE                TRICUSPID VALVE MV Area (PHT): 3.65 cm     TR Peak grad:   21.7 mmHg MV Decel Time: 208 msec     TR Vmax:        233.00 cm/s MV E velocity: 113.00 cm/s MV A velocity: 78.10 cm/s   SHUNTS MV E/A ratio:   1.45         Systemic VTI:  0.27 m                             Systemic Diam: 2.00 cm Candee Furbish MD Electronically signed by Candee Furbish MD Signature Date/Time: 04/19/2021/11:35:30 AM    Final    CT HEAD CODE STROKE WO CONTRAST  Result Date: 04/18/2021 CLINICAL DATA:  Code stroke.  Neuro deficit, acute, stroke suspected EXAM: CT HEAD WITHOUT CONTRAST TECHNIQUE: Contiguous axial images were obtained from the base of the skull through the vertex without intravenous contrast. RADIATION DOSE REDUCTION: This exam was performed according to the departmental dose-optimization program which includes automated exposure control, adjustment of the mA and/or kV according to patient size and/or use of iterative reconstruction technique. COMPARISON:  None. FINDINGS: Brain: There is no acute intracranial hemorrhage, mass effect, or edema. Gray-white differentiation is preserved. Patchy low-density in the supratentorial white matter is nonspecific but may reflect chronic microvascular ischemic changes. Ventricles and sulci are within normal limits in size and configuration. No extra-axial collection. Vascular: No hyperdense vessel. There is intracranial atherosclerotic calcification at the skull base. Skull: Unremarkable. Sinuses/Orbits: No significant opacification. Orbits are unremarkable. Other: Mastoid air cells are clear. ASPECTS ([place] Stroke Program Early CT Score) - Ganglionic level infarction (caudate, lentiform nuclei, internal capsule, insula, M1-M3 cortex): 7 - Supraganglionic infarction (M4-M6 cortex): 3 Total score (0-10 with 10 being normal): 10 IMPRESSION: There is no acute intracranial hemorrhage or evidence of acute infarction. ASPECT score is 10. These results were communicated to Dr. Theda Sers at 1:27 pm on 04/18/2021 by text page via the [facility] messaging system. Electronically Signed   By: Macy Mis M.D.   On: 04/18/2021 13:35   VAS US CAROTID  Result Date: 04/19/2021 Carotid Arterial Duplex Study  Patient Name:  Aaron Moss  Date of Exam:   04/19/2021 Medical Rec #: [id number]        Accession #:    [id number] Date of Birth: [birth year]        Patient Gender: M Patient Age:   24 years Exam Location:  [facility] Procedure:      VAS US CAROTID Referring Phys: PRAMOD SETHI --------------------------------------------------------------------------------  Indications:       CVA, Carotid artery disease and Left ICA stenosis seen on                    CTA. Risk Factors:      Hypertension, hyperlipidemia. Comparison Study:  No prior study Performing Technologist: Sharyn Lull  Simonetti MHA, RDMS, RVT, RDCS  Examination Guidelines: A complete evaluation includes B-mode imaging, spectral Doppler, color Doppler, and power Doppler as needed of all accessible portions of each vessel. Bilateral testing is considered an integral part of a complete examination. Limited examinations for reoccurring indications may be performed as noted.  Right Carotid Findings: +----------+--------+--------+--------+---------------------+------------------+             PSV cm/s EDV cm/s Stenosis Plaque Description    Comments            +----------+--------+--------+--------+---------------------+------------------+  CCA Prox   121      12                                      intimal thickening  +----------+--------+--------+--------+---------------------+------------------+  CCA Distal 98       15                heterogenous, smooth                                                             and calcific                              +----------+--------+--------+--------+---------------------+------------------+  ICA Prox   69       14                heterogenous,         Shadowing                                                  calcific and focal                        +----------+--------+--------+--------+---------------------+------------------+  ICA Distal 139      31                                                           +----------+--------+--------+--------+---------------------+------------------+  ECA        143      10                                                          +----------+--------+--------+--------+---------------------+------------------+ +----------+--------+-------+----------------+-------------------+             PSV cm/s EDV cms Describe         Arm Pressure (mmHG)  +----------+--------+-------+----------------+-------------------+  Subclavian 107              Multiphasic, WNL                      +----------+--------+-------+----------------+-------------------+ +---------+--------+--+--------+--+---------+  Vertebral PSV cm/s 75 EDV cm/s  13 Antegrade  +---------+--------+--+--------+--+---------+  Left Carotid Findings: +----------+--------+--------+--------+------------------+--------+             PSV cm/s EDV cm/s Stenosis Plaque Description Comments  +----------+--------+--------+--------+------------------+--------+  CCA Prox   121      17                                             +----------+--------+--------+--------+------------------+--------+  CCA Distal 100      12                heterogenous                 +----------+--------+--------+--------+------------------+--------+  ICA Prox   461      99       80-99%                                +----------+--------+--------+--------+------------------+--------+  ICA Mid    250      17                                             +----------+--------+--------+--------+------------------+--------+  ICA Distal 154      10                                             +----------+--------+--------+--------+------------------+--------+  ECA        33       7                                              +----------+--------+--------+--------+------------------+--------+ +----------+--------+--------+----------------+-------------------+             PSV cm/s EDV cm/s Describe         Arm Pressure (mmHG)   +----------+--------+--------+----------------+-------------------+  Subclavian 155               Multiphasic, WNL                      +----------+--------+--------+----------------+-------------------+ +---------+--------+--+--------+--+---------+  Vertebral PSV cm/s 77 EDV cm/s 23 Antegrade  +---------+--------+--+--------+--+---------+   Summary: Right Carotid: Velocities in the right ICA are consistent with a 1-39% stenosis. Left Carotid: Velocities in the left ICA are consistent with a 80-99% stenosis               by peak systolic velocities and plaque morphology. Vertebrals:  Bilateral vertebral arteries demonstrate antegrade flow. Subclavians: Normal flow hemodynamics were seen in bilateral subclavian              arteries. *See table(s) above for measurements and observations.     Preliminary    CT ANGIO HEAD CODE STROKE  Result Date: 04/18/2021 CLINICAL DATA:  Neuro deficit, acute, stroke suspected. Transient expressive aphasia and right upper extremity weakness. EXAM: CT ANGIOGRAPHY HEAD AND NECK TECHNIQUE: Multidetector CT imaging of the head and neck was performed using the standard protocol during bolus administration of intravenous contrast. Multiplanar CT image reconstructions and MIPs were obtained to  evaluate the vascular anatomy. Carotid stenosis measurements (when applicable) are obtained utilizing NASCET criteria, using the distal internal carotid diameter as the denominator. RADIATION DOSE REDUCTION: This exam was performed according to the departmental dose-optimization program which includes automated exposure control, adjustment of the mA and/or kV according to patient size and/or use of iterative reconstruction technique. CONTRAST:  44mL OMNIPAQUE IOHEXOL 350 MG/ML SOLN COMPARISON:  Head CT earlier same day FINDINGS: CTA NECK FINDINGS Aortic arch: Aortic atherosclerosis. No aneurysm or dissection. Branching pattern is normal. Right carotid system: Common carotid artery shows scattered  plaque but is widely patent to the bifurcation. Calcified plaque at the carotid bifurcation and ICA bulb. Minimal diameter of the proximal ICA is the same as the more distal cervical ICA, therefore there is no stenosis. Left carotid system: Common carotid artery shows scattered plaque but is widely patent to the bifurcation. There is extensive dense calcified plaque at the carotid bifurcation. Extensive dense calcification affecting ICA bulb results in near occlusion, luminal diameter less than 1 mm. ECA shows an origin stenosis with diameter of 1 mm. More distal cervical ICA is widely patent. Vertebral arteries: Calcified plaque at the right vertebral artery origin with stenosis of 50%. Left vertebral artery origin widely patent. Beyond the origins, both vertebral arteries appear normal through the cervical region to the foramen magnum. Skeleton: Ordinary cervical spondylosis. 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 Other neck: No lymphadenopathy. The salivary glands appear prominent. There is enlargement and heterogeneity of the thyroid gland. Upper chest: Lung apices are clear. Review of the MIP images confirms the above findings CTA HEAD FINDINGS Anterior circulation: Both internal carotid arteries are patent through the skull base and siphon regions. There is siphon atherosclerotic calcification with stenosis estimated at 50% on both sides. The anterior and middle cerebral vessels are patent. No large vessel occlusion. No proximal stenosis. More distal branch vessels do show ordinary atherosclerotic irregularity. Posterior circulation: Both vertebral arteries are patent through the foramen magnum to the basilar. No basilar stenosis. Posterior circulation branch vessels are patent. More distal branch vessels do show ordinary atherosclerotic irregularity. Venous sinuses: Patent and normal. Anatomic variants: None significant. Review of the MIP  images confirms the above findings IMPRESSION: No intracranial large vessel occlusion or correctable proximal stenosis. Widespread distal vessel atherosclerotic irregularity. Extensive calcified plaque at the carotid bifurcation on the left with masslike calcified plaque within the ICA bulb resulting in near occlusion. Residual lumen is less than 1 mm. This represents 90% or greater stenosis. Calcified plaque at the right carotid bifurcation but without measurable stenosis. 50% stenosis at the right vertebral artery origin. Electronically Signed   By: Nelson Chimes M.D.   On: 04/18/2021 14:21   CT ANGIO NECK CODE STROKE  Result Date: 04/18/2021 CLINICAL DATA:  Neuro deficit, acute, stroke suspected. Transient expressive aphasia and right upper extremity weakness. EXAM: CT ANGIOGRAPHY HEAD AND NECK TECHNIQUE: Multidetector CT imaging of the head and neck was performed using the standard protocol during bolus administration of intravenous contrast. Multiplanar CT image reconstructions and MIPs were obtained to evaluate the vascular anatomy. Carotid stenosis measurements (when applicable) are obtained utilizing NASCET criteria, using the distal internal carotid diameter as the denominator. RADIATION DOSE REDUCTION: This exam was performed according to the departmental dose-optimization program which includes automated exposure control, adjustment of the mA and/or kV according to patient size and/or use of iterative reconstruction technique. CONTRAST:  65mL OMNIPAQUE IOHEXOL 350 MG/ML SOLN COMPARISON:  Head CT earlier same day FINDINGS: CTA NECK FINDINGS Aortic arch: Aortic atherosclerosis. No aneurysm or dissection. Branching pattern is normal. Right carotid system: Common carotid artery shows scattered plaque but is widely patent to the bifurcation. Calcified plaque at the carotid bifurcation and ICA bulb. Minimal diameter of the proximal ICA is the same as the more distal cervical ICA, therefore there is no  stenosis. Left carotid system: Common carotid artery shows scattered plaque but is widely patent to the bifurcation. There is extensive dense calcified plaque at the carotid bifurcation. Extensive dense calcification affecting ICA bulb results in near occlusion, luminal diameter less than 1 mm. ECA shows an origin stenosis with diameter of 1 mm. More distal cervical ICA is widely patent. Vertebral arteries: Calcified plaque at the right vertebral artery origin with stenosis of 50%. Left vertebral artery origin widely patent. Beyond the origins, both vertebral arteries appear normal through the cervical region to the foramen magnum. Skeleton: Ordinary cervical spondylosis. 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 Other neck: No lymphadenopathy. The salivary glands appear prominent. There is enlargement and heterogeneity of the thyroid gland. Upper chest: Lung apices are clear. Review of the MIP images confirms the above findings CTA HEAD FINDINGS Anterior circulation: Both internal carotid arteries are patent through the skull base and siphon regions. There is siphon atherosclerotic calcification with stenosis estimated at 50% on both sides. The anterior and middle cerebral vessels are patent. No large vessel occlusion. No proximal stenosis. More distal branch vessels do show ordinary atherosclerotic irregularity. Posterior circulation: Both vertebral arteries are patent through the foramen magnum to the basilar. No basilar stenosis. Posterior circulation branch vessels are patent. More distal branch vessels do show ordinary atherosclerotic irregularity. Venous sinuses: Patent and normal. Anatomic variants: None significant. Review of the MIP images confirms the above findings IMPRESSION: No intracranial large vessel occlusion or correctable proximal stenosis. Widespread distal vessel atherosclerotic irregularity. Extensive calcified plaque at the  carotid bifurcation on the left with masslike calcified plaque within the ICA bulb resulting in near occlusion. Residual lumen is less than 1 mm. This represents 90% or greater stenosis. Calcified plaque at the right carotid bifurcation but without measurable stenosis. 50% stenosis at the right vertebral artery origin. Electronically Signed   By: Nelson Chimes M.D.   On: 04/18/2021 14:21       The results of significant diagnostics from this hospitalization (including imaging, microbiology, ancillary and laboratory) are listed below for reference.     Microbiology: No results found for this or any previous visit (from the past 240 hour(s)).   Labs:  CBC: Recent Labs  Lab 04/18/21 1315 04/18/21 1321 04/19/21 0800  WBC 8.7  --  6.5  NEUTROABS 6.1  --   --   HGB 10.4* 11.9* 9.7*  HCT 34.3* 35.0* 31.6*  MCV 75.9*  --  73.8*  PLT 263  --  263   BMP &GFR Recent Labs  Lab 04/18/21 1315 04/18/21 1321 04/19/21 0800 04/19/21 1330 04/19/21 1643  NA 139 139 140  --   --   K 3.4* 3.3* 2.6* 2.7* 4.0  CL 104 103 104  --   --   CO2 26  --  28  --   --   GLUCOSE 106* 105* 99  --   --   BUN 14 16 8   --   --   CREATININE 1.24 1.20 0.98  --   --   CALCIUM 8.6*  --  8.6*  --   --   MG  --   --  1.7  --   --   PHOS  --   --  3.2  --   --    Estimated Creatinine Clearance: 69.8 mL/min (by C-G formula based on SCr of 0.98 mg/dL). Liver & Pancreas: Recent Labs  Lab 04/18/21 1315 04/19/21 0800  AST 16  --   ALT 11  --   ALKPHOS 85  --   BILITOT 1.1  --   PROT 6.3*  --   ALBUMIN 3.0* 2.6*   No results for input(s): LIPASE, AMYLASE in the last 168 hours. No results for input(s): AMMONIA in the last 168 hours. Diabetic: Recent Labs    04/18/21 1529  HGBA1C 5.1   Recent Labs  Lab 04/18/21 1314  GLUCAP 99   Cardiac Enzymes: No results for input(s): CKTOTAL, CKMB, CKMBINDEX, TROPONINI in the last 168 hours. Recent Labs    11/16/20 1216  PROBNP 71.0   Coagulation  Profile: Recent Labs  Lab 04/18/21 1315  INR 1.1   Thyroid Function Tests: No results for input(s): TSH, T4TOTAL, FREET4, T3FREE, THYROIDAB in the last 72 hours. Lipid Profile: Recent Labs    04/18/21 1529  CHOL 95  HDL 32*  LDLCALC 53  TRIG 50  CHOLHDL 3.0   Anemia Panel: No results for input(s): VITAMINB12, FOLATE, FERRITIN, TIBC, IRON, RETICCTPCT in the last 72 hours. Urine analysis:    Component Value Date/Time   COLORURINE YELLOW 07/07/2020 [place] 07/07/2020 1119   LABSPEC 1.010 07/07/2020 1119   PHURINE 6.0 07/07/2020 1119   GLUCOSEU NEGATIVE 07/07/2020 1119   HGBUR NEGATIVE 07/07/2020 1119   BILIRUBINUR NEGATIVE 07/07/2020 1119   KETONESUR NEGATIVE 07/07/2020 1119   PROTEINUR 100 (A) 02/24/2019 0207   UROBILINOGEN 0.2 07/07/2020 1119   NITRITE NEGATIVE 07/07/2020 1119   LEUKOCYTESUR NEGATIVE 07/07/2020 1119   Sepsis Labs: Invalid input(s): PROCALCITONIN, LACTICIDVEN   Time coordinating discharge: 45 minutes  SIGNED:  Mercy Riding, MD  [facility] 04/19/2021, 6:01 PM

## 2021-04-19 NOTE — Evaluation (Signed)
Physical Therapy Evaluation Patient Details Name: Aaron Moss MRN: [id number] DOB: August 20, 1945 Today's Date: 04/19/2021  History of Present Illness  76 y/o male presenting 1/30 after fall at home, slurred speech and R UE weakness.  CT head negative, CTA with L internal carotid stenosis, plan for endarterectomy 04/25/21. 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  PMH includes: colon cancer, glaucoma, HTN, prostate cancer, iron deficiency anemia.  Clinical Impression  Pt was seen for progression of gait from bed to hallway with and without AD.  He requires a cane for support of L Knee pain, and recommend him to follow up with his MD who is monitoring for when pt is ready for TKA on LLE.  Has had a lot of therapy and with SPC is quite a bit like baseline.  See for acute PT goals, preparing to go home and will see ortho regarding need for therapy later.       Recommendations for follow up therapy are one component of a multi-disciplinary discharge planning process, led by the attending physician.  Recommendations may be updated based on patient status, additional functional criteria and insurance authorization.  Follow Up Recommendations No PT follow up    Assistance Recommended at Discharge Intermittent Supervision/Assistance  Patient can return home with the following  A little help with bathing/dressing/bathroom;Assistance with cooking/housework;Help with stairs or ramp for entrance;Assist for transportation    Equipment Recommendations None recommended by PT  Recommendations for Other Services       Functional Status Assessment       Precautions / Restrictions Precautions Precautions: None Restrictions Weight Bearing Restrictions: No      Mobility  Bed Mobility Overal bed mobility: Needs Assistance Bed Mobility: Supine to Sit, Sit to Supine     Supine to sit: Min guard Sit to supine: Min guard        Transfers Overall transfer level:  Needs assistance Equipment used: 1 person hand held assist Transfers: Sit to/from Stand Sit to Stand: Min guard           General transfer comment: min guard for safety    Ambulation/Gait Ambulation/Gait assistance: Min guard Gait Distance (Feet): 120 Feet Assistive device: 1 person hand held assist, Straight cane Gait Pattern/deviations: Step-through pattern, Decreased stride length, Wide base of support, Decreased weight shift to left Gait velocity: reduced Gait velocity interpretation: <1.31 ft/sec, indicative of household ambulator   General Gait Details: reduced WB on LLE knee with SPC on RUE  Stairs            Wheelchair Mobility    Modified Rankin (Stroke Patients Only) Modified Rankin (Stroke Patients Only) Pre-Morbid Rankin Score: No significant disability Modified Rankin: Moderate disability     Balance Overall balance assessment: Needs assistance Sitting-balance support: Feet supported Sitting balance-Leahy Scale: Good     Standing balance support: Single extremity supported Standing balance-Leahy Scale: Fair                               Pertinent Vitals/Pain      Home Living                          Prior Function                       Hand Dominance        Extremity/Trunk Assessment  Communication      Cognition Arousal/Alertness: Awake/alert Behavior During Therapy: WFL for tasks assessed/performed Overall Cognitive Status: Impaired/Different from baseline Area of Impairment: Awareness, Safety/judgement, Following commands, Attention                   Current Attention Level: Selective Memory: Decreased short-term memory Following Commands: Follows one step commands with increased time Safety/Judgement: Decreased awareness of deficits Awareness: Intellectual   General Comments: memory changes at baseline        General Comments General comments (skin integrity,  edema, etc.): pt is attempted to walk without AD and with SPC, noting better control of L side lateral shift to avoid the hip and knee as a complex    Exercises     Assessment/Plan    PT Assessment    PT Problem List         PT Treatment Interventions      PT Goals (Current goals can be found in the Care Plan section)  Acute Rehab PT Goals Patient Stated Goal: get home with family    Frequency Min 3X/week     Co-evaluation               AM-PAC PT "6 Clicks" Mobility  Outcome Measure Help needed turning from your back to your side while in a flat bed without using bedrails?: None Help needed moving from lying on your back to sitting on the side of a flat bed without using bedrails?: None Help needed moving to and from a bed to a chair (including a wheelchair)?: A Little Help needed standing up from a chair using your arms (e.g., wheelchair or bedside chair)?: A Little Help needed to walk in hospital room?: A Little Help needed climbing 3-5 steps with a railing? : A Little 6 Click Score: 20    End of Session Equipment Utilized During Treatment: Gait belt Activity Tolerance: Patient limited by pain Patient left: in bed;with call bell/phone within reach;with bed alarm set;with family/visitor present Nurse Communication: Mobility status PT Visit Diagnosis: Unsteadiness on feet (R26.81);Muscle weakness (generalized) (M62.81);Pain Pain - Right/Left: Left Pain - part of body: Knee    Time: 0630-1601 PT Time Calculation (min) (ACUTE ONLY): 24 min   Charges:   PT Evaluation $PT Eval Moderate Complexity: 1 Mod PT Treatments $Gait Training: 8-22 mins       Ramond Dial 04/19/2021, 5:20 PM  Mee Hives, PT PhD Acute Rehab Dept. Number: [place] [facility] [place]

## 2021-04-19 NOTE — Care Management Obs Status (Signed)
[facility] NOTIFICATION   Patient Details  Name: Aaron Moss MRN: [id number] Date of Birth: [birth year]   Medicare Observation Status Notification Given:  Yes    Pollie Friar, RN 04/19/2021, 4:26 PM

## 2021-04-19 NOTE — Progress Notes (Signed)
PROGRESS NOTE  Aaron Moss TYO:[id number] DOB: 06/05/1945   PCP: Biagio Borg, MD  Patient is from: Home.  Lives with family.  Independently ambulates at baseline.  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  Symptoms resolved in ED. CT head without acute finding.   Patient was not a candidate for tPA due to resolution of symptoms and malignancy.  MRI brain showed scattered punctate acute infarcts in the left ACA-PCA watershed territory. CTA head and neck noted severe left ICA stenosis with masslike calcification of the left IC bulb causing near complete occlusion.   Vascular surgery consulted, and planning carotid endarterectomy on 04/25/2020 either inpatient or outpatient.  TTE basically normal.  LDL 53.  A1c 5.1%.  Neurology following, and planning overnight sleep study.  Subjective: Seen and examined earlier this morning.  No major events overnight or this morning.  No complaints this morning.  He denies headache, vision change, numbness, tingling, focal weakness, aphasia, chest pain, dyspnea, GI or UTI symptoms.  Objective: Vitals:   04/19/21 0800 04/19/21 0800 04/19/21 0826 04/19/21 1121  BP: (!) 142/57  (!) 148/63   Pulse: 67  64   Resp: 11  14   Temp:  98.6 F (37 C) 99.1 F (37.3 C) 98.1 F (36.7 C)  TempSrc:  Oral Oral Oral  SpO2: 99%  100% 100%  Weight:      Height:        Examination:  GENERAL: No apparent distress.  Nontoxic. HEENT: MMM.  Vision and hearing grossly intact.  NECK: Supple.  No apparent JVD.  RESP:  No IWOB.  Fair aeration bilaterally. CVS:  RRR.  Heart sounds normal.  ABD/GI/GU: BS+. Abd soft, NTND.  MSK/EXT:  Moves extremities. No apparent deformity. No edema.  SKIN: no apparent skin lesion or wound NEURO: Awake, alert and oriented appropriately. Speech clear. Cranial nerves II-XII  intact. Motor 5/5 in all muscle groups of UE and LE bilaterally, Normal tone. Light sensation intact in all dermatomes of upper and lower ext bilaterally. Patellar reflex symmetric.  No pronator drift.  Finger to nose intact. PSYCH: Calm. Normal affect.   Procedures:  None  Microbiology summarized: [id number] and influenza PCR nonreactive.  Assessment & Plan: Acute left ACA/PCA CVA-presented with transient lightheadedness, slurred speech and expressive aphasia.  CT head, CTA head and neck and MRI brain as above.  TTE without significant finding.  LDL 53.  A1c 5.1.  Currently asymptomatic. -Neurology following -Plan for sleep study tonight -Permissive hypertension?  At least he benefits from higher than normal SBP given left ICA stenosis. -Continue statin, Plavix and aspirin.  Symptomatic severe left ICA stenosis-likely cause of his CVA and symptoms. -Plan for endarterectomy by vascular surgery on 04/25/2021 -Statin, Plavix and aspirin as above -May benefit from higher than normal SBP  Duodenal cancer-recent diagnosis. -Outpatient follow-up with surgery and oncology  Essential hypertension: SBP in 140s.  -Would benefit from higher than normal SBP -Hold home HCTZ -Decreased home Coreg to 6.25 mg -Continue amlodipine and benazepril  Hypokalemia: K2.6.  Mg 1.7 likely due to HCTZ. -Hold HCTZ -P.o. KCl 40x3 -P.o. Aldactone 25 mg  x 1 -IV magnesium sulfate 2 g x 1  Iron deficiency anemia: H&H at baseline.  Could have some GI bleed from cancer Recent Labs    07/07/20 1119 11/16/20 1216 02/16/21 1504 03/16/21 0953 04/04/21 1210 04/07/21 1514 04/18/21 1315 04/18/21 1321 04/19/21 0800  HGB 12.7* 11.6* 9.6* 10.6* 10.9* 10.0* 10.4* 11.9* 9.7*   -Monitor H&H -Continue PPI  Near syncope/fall at home-likely due to #1 and ICA stenosis. -Fall precaution -PT/OT  Class II obesity Body mass index is 35.51 kg/m.  -Encourage lifestyle change to lose weight.       DVT prophylaxis:  enoxaparin (LOVENOX) injection 40 mg Start: 04/18/21 1800  Code Status: Full code Family Communication: Patient and/or RN. Available if any question.  Level of care: Telemetry Medical Status is: Observation  The patient will require care spanning > 2 midnights and should be moved to inpatient because: Evaluation for acute CVA, significant electrolyte derangements  Planned Discharge Destination: Home        Consultants:  Neurology Vascular surgery   Sch Meds:  Scheduled Meds:  amLODipine  10 mg Oral Daily   aspirin EC  81 mg Oral Daily   atorvastatin  40 mg Oral Daily   benazepril  40 mg Oral Daily   carvedilol  6.25 mg Oral BID WC   clopidogrel  75 mg Oral Daily   enoxaparin (LOVENOX) injection  40 mg Subcutaneous Q24H   pantoprazole  40 mg Oral Daily   potassium chloride  40 mEq Oral Q3H   spironolactone  25 mg Oral Once   Continuous Infusions: PRN Meds:.acetaminophen **OR** acetaminophen (TYLENOL) oral liquid 160 mg/5 mL **OR** acetaminophen, traMADol  Antimicrobials: Anti-infectives (From admission, onward)    None        I have personally reviewed the following labs and images: CBC: Recent Labs  Lab 04/18/21 1315 04/18/21 1321 04/19/21 0800  WBC 8.7  --  6.5  NEUTROABS 6.1  --   --   HGB 10.4* 11.9* 9.7*  HCT 34.3* 35.0* 31.6*  MCV 75.9*  --  73.8*  PLT 263  --  263   BMP &GFR Recent Labs  Lab 04/18/21 1315 04/18/21 1321 04/19/21 0800 04/19/21 1330  NA 139 139 140  --   K 3.4* 3.3* 2.6* 2.7*  CL 104 103 104  --   CO2 26  --  28  --   GLUCOSE 106* 105* 99  --   BUN 14 16 8   --   CREATININE 1.24 1.20 0.98  --   CALCIUM 8.6*  --  8.6*  --   MG  --   --  1.7  --   PHOS  --   --  3.2  --     Estimated Creatinine Clearance: 69.8 mL/min (by C-G formula based on SCr of 0.98 mg/dL). Liver & Pancreas: Recent Labs  Lab 04/18/21 1315 04/19/21 0800  AST 16  --   ALT 11  --   ALKPHOS 85  --   BILITOT 1.1  --   PROT 6.3*  --   ALBUMIN 3.0* 2.6*   No results for input(s): LIPASE, AMYLASE in the last 168 hours. No results for input(s): AMMONIA in the last 168 hours. Diabetic: Recent Labs    04/18/21 1529  HGBA1C 5.1   Recent Labs  Lab 04/18/21 1314  GLUCAP 99   Cardiac Enzymes: No results for input(s): CKTOTAL, CKMB, CKMBINDEX, TROPONINI in the last 168 hours. Recent Labs    11/16/20 1216  PROBNP  71.0   Coagulation Profile: Recent Labs  Lab 04/18/21 1315  INR 1.1   Thyroid Function Tests: No results for input(s): TSH, T4TOTAL, FREET4, T3FREE, THYROIDAB in the last 72 hours. Lipid Profile: Recent Labs    04/18/21 1529  CHOL 95  HDL 32*  LDLCALC 53  TRIG 50  CHOLHDL 3.0   Anemia Panel: No results for input(s): VITAMINB12, FOLATE, FERRITIN, TIBC, IRON, RETICCTPCT in the last 72 hours. Urine analysis:    Component Value Date/Time   COLORURINE YELLOW 07/07/2020 [place] 07/07/2020 1119   LABSPEC 1.010 07/07/2020 1119   PHURINE 6.0 07/07/2020 1119   GLUCOSEU NEGATIVE 07/07/2020 1119   HGBUR NEGATIVE 07/07/2020 1119   BILIRUBINUR NEGATIVE 07/07/2020 1119   KETONESUR NEGATIVE 07/07/2020 1119   PROTEINUR 100 (A) 02/24/2019 0207   UROBILINOGEN 0.2 07/07/2020 1119   NITRITE NEGATIVE 07/07/2020 1119   LEUKOCYTESUR NEGATIVE 07/07/2020 1119   Sepsis Labs: Invalid input(s): PROCALCITONIN, [place]  Microbiology: No results found for this or any previous visit (from the past 240 hour(s)).  Radiology Studies: MR BRAIN WO CONTRAST  Result Date: 04/18/2021 CLINICAL DATA:  TIA, follow-up, slurred speech and aphasia EXAM: MRI HEAD WITHOUT CONTRAST TECHNIQUE: Multiplanar, multiecho pulse sequences of the brain and surrounding structures  were obtained without intravenous contrast. COMPARISON:  No prior MRI, correlation is made with CT and CTA head neck 04/18/2021 FINDINGS: Brain: Scattered punctate foci of restricted diffusion with ADC correlates in the left frontal and parietal lobes, in the ACA-PCA watershed territory (series 5, images 86-91). No other areas of restricted diffusion. No acute hemorrhage, mass, mass effect, or midline shift. No hydrocephalus or extra-axial collection. No foci of hemosiderin deposition to suggest remote hemorrhage. Scattered and confluent T2 hyperintense signal in the periventricular white matter, likely the sequela of moderate chronic small vessel ischemic disease. Vascular: Normal flow voids. Skull and upper cervical spine: Normal marrow signal. Sinuses/Orbits: Negative. Other: None. IMPRESSION: Scattered punctate acute infarcts in the left ACA-PCA watershed territory. Electronically Signed   By: Merilyn Baba M.D.   On: 04/18/2021 19:07   ECHOCARDIOGRAM COMPLETE  Result Date: 04/19/2021    ECHOCARDIOGRAM REPORT   Patient Name:   SILAS MUFF Date of Exam: 04/19/2021 Medical Rec #:  [id number]       Height:       66.0 in Accession #:    [id number]      Weight:       220.0 lb Date of Birth:  11-May-1945       BSA:          2.082 m Patient Age:    26 years        BP:           148/63 mmHg Patient Gender: M               HR:           65 bpm. Exam Location:  Inpatient Procedure: 2D Echo, Cardiac Doppler and Color Doppler Indications:    TIA G45.9  History:        Patient has no prior history of Echocardiogram examinations.                 Risk Factors:Hypertension and Dyslipidemia.  Sonographer:    Bernadene Person RDCS Referring Phys: [id number] RONDELL A SMITH IMPRESSIONS  1. Left ventricular ejection fraction, by estimation, is 65 to 70%. The left ventricle has normal function. The left ventricle has no regional wall motion abnormalities. Left  ventricular diastolic parameters were normal.  2. Right ventricular  systolic function is normal. The right ventricular size is normal. There is normal pulmonary artery systolic pressure.  3. The mitral valve is normal in structure. No evidence of mitral valve regurgitation. No evidence of mitral stenosis.  4. The aortic valve is normal in structure. Aortic valve regurgitation is not visualized. No aortic stenosis is present.  5. The inferior vena cava is normal in size with greater than 50% respiratory variability, suggesting right atrial pressure of 3 mmHg. Conclusion(s)/Recommendation(s): No intracardiac source of embolism detected on this transthoracic study. Consider a transesophageal echocardiogram to exclude cardiac source of embolism if clinically indicated. FINDINGS  Left Ventricle: Left ventricular ejection fraction, by estimation, is 65 to 70%. The left ventricle has normal function. The left ventricle has no regional wall motion abnormalities. The left ventricular internal cavity size was normal in size. There is  no left ventricular hypertrophy. Left ventricular diastolic parameters were normal. Right Ventricle: The right ventricular size is normal. No increase in right ventricular wall thickness. Right ventricular systolic function is normal. There is normal pulmonary artery systolic pressure. The tricuspid regurgitant velocity is 2.33 m/s, and  with an assumed right atrial pressure of 3 mmHg, the estimated right ventricular systolic pressure is 08.6 mmHg. Left Atrium: Left atrial size was normal in size. Right Atrium: Right atrial size was normal in size. Pericardium: There is no evidence of pericardial effusion. Mitral Valve: The mitral valve is normal in structure. No evidence of mitral valve regurgitation. No evidence of mitral valve stenosis. Tricuspid Valve: The tricuspid valve is normal in structure. Tricuspid valve regurgitation is trivial. No evidence of tricuspid stenosis. Aortic Valve: The aortic valve is normal in structure. Aortic valve regurgitation is not  visualized. No aortic stenosis is present. Pulmonic Valve: The pulmonic valve was normal in structure. Pulmonic valve regurgitation is not visualized. No evidence of pulmonic stenosis. Aorta: The aortic root is normal in size and structure. Venous: The inferior vena cava is normal in size with greater than 50% respiratory variability, suggesting right atrial pressure of 3 mmHg. IAS/Shunts: No atrial level shunt detected by color flow Doppler.  LEFT VENTRICLE PLAX 2D LVIDd:         5.10 cm     Diastology LVIDs:         3.50 cm     LV e' medial:    8.18 cm/s LV PW:         1.00 cm     LV E/e' medial:  13.8 LV IVS:        1.10 cm     LV e' lateral:   9.15 cm/s LVOT diam:     2.00 cm     LV E/e' lateral: 12.3 LV SV:         86 LV SV Index:   41 LVOT Area:     3.14 cm  LV Volumes (MOD) LV vol d, MOD A2C: 90.8 ml LV vol d, MOD A4C: 61.2 ml LV vol s, MOD A2C: 35.5 ml LV vol s, MOD A4C: 23.3 ml LV SV MOD A2C:     55.3 ml LV SV MOD A4C:     61.2 ml LV SV MOD BP:      45.2 ml RIGHT VENTRICLE RV S prime:     14.30 cm/s TAPSE (M-mode): 1.8 cm LEFT ATRIUM             Index        RIGHT ATRIUM  Index LA diam:        3.60 cm 1.73 cm/m   RA Area:     14.60 cm LA Vol (A2C):   50.7 ml 24.35 ml/m  RA Volume:   36.90 ml  17.72 ml/m LA Vol (A4C):   41.3 ml 19.83 ml/m LA Biplane Vol: 46.6 ml 22.38 ml/m  AORTIC VALVE LVOT Vmax:   129.00 cm/s LVOT Vmean:  74.700 cm/s LVOT VTI:    0.273 m  AORTA Ao Root diam: 3.10 cm Ao Asc diam:  3.50 cm MITRAL VALVE                TRICUSPID VALVE MV Area (PHT): 3.65 cm     TR Peak grad:   21.7 mmHg MV Decel Time: 208 msec     TR Vmax:        233.00 cm/s MV E velocity: 113.00 cm/s MV A velocity: 78.10 cm/s   SHUNTS MV E/A ratio:  1.45         Systemic VTI:  0.27 m                             Systemic Diam: 2.00 cm Candee Furbish MD Electronically signed by Candee Furbish MD Signature Date/Time: 04/19/2021/11:35:30 AM    Final    VAS US CAROTID  Result Date: 04/19/2021 Carotid Arterial Duplex  Study Patient Name:  RONDALE NIES [facility]  Date of Exam:   04/19/2021 Medical Rec #: [id number]        Accession #:    [id number] Date of Birth: 1945/12/02        Patient Gender: M Patient Age:   16 years Exam Location:  [facility] Procedure:      VAS US CAROTID Referring Phys: PRAMOD SETHI --------------------------------------------------------------------------------  Indications:       CVA, Carotid artery disease and Left ICA stenosis seen on                    CTA. Risk Factors:      Hypertension, hyperlipidemia. Comparison Study:  No prior study Performing Technologist: Maudry Mayhew MHA, RDMS, RVT, RDCS  Examination Guidelines: A complete evaluation includes B-mode imaging, spectral Doppler, color Doppler, and power Doppler as needed of all accessible portions of each vessel. Bilateral testing is considered an integral part of a complete examination. Limited examinations for reoccurring indications may be performed as noted.  Right Carotid Findings: +----------+--------+--------+--------+---------------------+------------------+             PSV cm/s EDV cm/s Stenosis Plaque Description    Comments            +----------+--------+--------+--------+---------------------+------------------+  CCA Prox   121      12                                      intimal thickening  +----------+--------+--------+--------+---------------------+------------------+  CCA Distal 98       15                heterogenous, smooth  and calcific                              +----------+--------+--------+--------+---------------------+------------------+  ICA Prox   69       14                heterogenous,         Shadowing                                                  calcific and focal                        +----------+--------+--------+--------+---------------------+------------------+  ICA Distal 139      31                                                           +----------+--------+--------+--------+---------------------+------------------+  ECA        143      10                                                          +----------+--------+--------+--------+---------------------+------------------+ +----------+--------+-------+----------------+-------------------+             PSV cm/s EDV cms Describe         Arm Pressure (mmHG)  +----------+--------+-------+----------------+-------------------+  Subclavian 107              Multiphasic, WNL                      +----------+--------+-------+----------------+-------------------+ +---------+--------+--+--------+--+---------+  Vertebral PSV cm/s 75 EDV cm/s 13 Antegrade  +---------+--------+--+--------+--+---------+  Left Carotid Findings: +----------+--------+--------+--------+------------------+--------+             PSV cm/s EDV cm/s Stenosis Plaque Description Comments  +----------+--------+--------+--------+------------------+--------+  CCA Prox   121      17                                             +----------+--------+--------+--------+------------------+--------+  CCA Distal 100      12                heterogenous                 +----------+--------+--------+--------+------------------+--------+  ICA Prox   461      99       80-99%                                +----------+--------+--------+--------+------------------+--------+  ICA Mid    250      17                                             +----------+--------+--------+--------+------------------+--------+  ICA Distal 154      10                                             +----------+--------+--------+--------+------------------+--------+  ECA        33       7                                              +----------+--------+--------+--------+------------------+--------+ +----------+--------+--------+----------------+-------------------+             PSV cm/s EDV cm/s Describe         Arm Pressure (mmHG)   +----------+--------+--------+----------------+-------------------+  Subclavian 155               Multiphasic, WNL                      +----------+--------+--------+----------------+-------------------+ +---------+--------+--+--------+--+---------+  Vertebral PSV cm/s 77 EDV cm/s 23 Antegrade  +---------+--------+--+--------+--+---------+   Summary: Right Carotid: Velocities in the right ICA are consistent with a 1-39% stenosis. Left Carotid: Velocities in the left ICA are consistent with a 80-99% stenosis               by peak systolic velocities and plaque morphology. Vertebrals:  Bilateral vertebral arteries demonstrate antegrade flow. Subclavians: Normal flow hemodynamics were seen in bilateral subclavian              arteries. *See table(s) above for measurements and observations.     Preliminary    CT ANGIO HEAD CODE STROKE  Result Date: 04/18/2021 CLINICAL DATA:  Neuro deficit, acute, stroke suspected. Transient expressive aphasia and right upper extremity weakness. EXAM: CT ANGIOGRAPHY HEAD AND NECK TECHNIQUE: Multidetector CT imaging of the head and neck was performed using the standard protocol during bolus administration of intravenous contrast. Multiplanar CT image reconstructions and MIPs were obtained to evaluate the vascular anatomy. Carotid stenosis measurements (when applicable) are obtained utilizing NASCET criteria, using the distal internal carotid diameter as the denominator. RADIATION DOSE REDUCTION: This exam was performed according to the departmental dose-optimization program which includes automated exposure control, adjustment of the mA and/or kV according to patient size and/or use of iterative reconstruction technique. CONTRAST:  91mL OMNIPAQUE IOHEXOL 350 MG/ML SOLN COMPARISON:  Head CT earlier same day FINDINGS: CTA NECK FINDINGS Aortic arch: Aortic atherosclerosis. No aneurysm or dissection. Branching pattern is normal. Right carotid system: Common carotid artery shows scattered  plaque but is widely patent to the bifurcation. Calcified plaque at the carotid bifurcation and ICA bulb. Minimal diameter of the proximal ICA is the same as the more distal cervical ICA, therefore there is no stenosis. Left carotid system: Common carotid artery shows scattered plaque but is widely patent to the bifurcation. There is extensive dense calcified plaque at the carotid bifurcation. Extensive dense calcification affecting ICA bulb results in near occlusion, luminal diameter less than 1 mm. ECA shows an origin stenosis with diameter of 1 mm. More distal cervical ICA is widely patent. Vertebral arteries: Calcified plaque at the right vertebral artery origin with stenosis of 50%. Left vertebral artery origin widely patent. Beyond the origins, both vertebral arteries appear normal through the cervical region to the foramen magnum. Skeleton: Ordinary cervical spondylosis. 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 Other neck: No lymphadenopathy. The salivary glands appear prominent. There is enlargement and heterogeneity of the thyroid gland. Upper chest: Lung apices are clear. Review of the MIP images confirms the above findings CTA HEAD FINDINGS Anterior circulation: Both internal carotid arteries are patent through the skull base and siphon regions. There is siphon atherosclerotic calcification with stenosis estimated at 50% on both sides. The anterior and middle cerebral vessels are patent. No large vessel occlusion. No proximal stenosis. More distal branch vessels do show ordinary atherosclerotic irregularity. Posterior circulation: Both vertebral arteries are patent through the foramen magnum to the basilar. No basilar stenosis. Posterior circulation branch vessels are patent. More distal branch vessels do show ordinary atherosclerotic irregularity. Venous sinuses: Patent and normal. Anatomic variants: None significant. Review of the MIP  images confirms the above findings IMPRESSION: No intracranial large vessel occlusion or correctable proximal stenosis. Widespread distal vessel atherosclerotic irregularity. Extensive calcified plaque at the carotid bifurcation on the left with masslike calcified plaque within the ICA bulb resulting in near occlusion. Residual lumen is less than 1 mm. This represents 90% or greater stenosis. Calcified plaque at the right carotid bifurcation but without measurable stenosis. 50% stenosis at the right vertebral artery origin. Electronically Signed   By: Nelson Chimes M.D.   On: 04/18/2021 14:21   CT ANGIO NECK CODE STROKE  Result Date: 04/18/2021 CLINICAL DATA:  Neuro deficit, acute, stroke suspected. Transient expressive aphasia and right upper extremity weakness. EXAM: CT ANGIOGRAPHY HEAD AND NECK TECHNIQUE: Multidetector CT imaging of the head and neck was performed using the standard protocol during bolus administration of intravenous contrast. Multiplanar CT image reconstructions and MIPs were obtained to evaluate the vascular anatomy. Carotid stenosis measurements (when applicable) are obtained utilizing NASCET criteria, using the distal internal carotid diameter as the denominator. RADIATION DOSE REDUCTION: This exam was performed according to the departmental dose-optimization program which includes automated exposure control, adjustment of the mA and/or kV according to patient size and/or use of iterative reconstruction technique. CONTRAST:  43mL OMNIPAQUE IOHEXOL 350 MG/ML SOLN COMPARISON:  Head CT earlier same day FINDINGS: CTA NECK FINDINGS Aortic arch: Aortic atherosclerosis. No aneurysm or dissection. Branching pattern is normal. Right carotid system: Common carotid artery shows scattered plaque but is widely patent to the bifurcation. Calcified plaque at the carotid bifurcation and ICA bulb. Minimal diameter of the proximal ICA is the same as the more distal cervical ICA, therefore there is no  stenosis. Left carotid system: Common carotid artery shows scattered plaque but is widely patent to the bifurcation. There is extensive dense calcified plaque at the carotid bifurcation. Extensive dense calcification affecting ICA bulb results in near occlusion, luminal diameter less than 1 mm. ECA shows an origin stenosis with diameter of 1 mm. More distal cervical ICA is widely patent. Vertebral arteries: Calcified plaque at the right vertebral artery origin with stenosis of 50%. Left vertebral artery origin widely patent. Beyond the origins, both vertebral arteries appear normal through the cervical region to the foramen magnum. Skeleton: Ordinary cervical spondylosis. 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 Other neck: No lymphadenopathy. The salivary glands appear prominent. There is enlargement and heterogeneity of the thyroid gland. Upper chest: Lung apices are clear. Review of the MIP images confirms the above findings CTA HEAD FINDINGS Anterior circulation: Both internal carotid arteries are patent through the skull base and siphon regions. There is siphon atherosclerotic calcification with stenosis estimated at  50% on both sides. The anterior and middle cerebral vessels are patent. No large vessel occlusion. No proximal stenosis. More distal branch vessels do show ordinary atherosclerotic irregularity. Posterior circulation: Both vertebral arteries are patent through the foramen magnum to the basilar. No basilar stenosis. Posterior circulation branch vessels are patent. More distal branch vessels do show ordinary atherosclerotic irregularity. Venous sinuses: Patent and normal. Anatomic variants: None significant. Review of the MIP images confirms the above findings IMPRESSION: No intracranial large vessel occlusion or correctable proximal stenosis. Widespread distal vessel atherosclerotic irregularity. Extensive calcified plaque at the  carotid bifurcation on the left with masslike calcified plaque within the ICA bulb resulting in near occlusion. Residual lumen is less than 1 mm. This represents 90% or greater stenosis. Calcified plaque at the right carotid bifurcation but without measurable stenosis. 50% stenosis at the right vertebral artery origin. Electronically Signed   By: Nelson Chimes M.D.   On: 04/18/2021 14:21      Tymarion Everard T. [place]  If 7PM-7AM, please contact night-coverage [email or web address] 04/19/2021, 2:09 PM

## 2021-04-19 NOTE — Progress Notes (Addendum)
Date and time results received: 04/19/21 9:02 AM    Test: K Critical Value: 2.6  Name of Provider Notified: Dr. Cyndia Skeeters 9:03 AM   Orders Received? Or Actions Taken?: Waiting for orders  9:11 AM Orders received. See [facility]

## 2021-04-19 NOTE — Progress Notes (Addendum)
Date and time results received: 04/19/21 2:06 PM  (use smartphrase ".now" to insert current time)  Test: K Critical Value: 2.7  Name of Provider Notified: Dr. Cyndia Skeeters  04/19/21 2:06 PM   Orders Received? Or Actions Taken?:  2:10 PM 04/19/21 Orders received. See [facility]

## 2021-04-19 NOTE — Progress Notes (Addendum)
STROKE TEAM PROGRESS NOTE   INTERVAL HISTORY No family at the bedside.  Patient reports having RUE weakness for 10-15 minutes associated with dizziness when he went to the bathroom. Patient also had receptive and expressive aphasia that persisted until he arrived in the ED. Patient reports he is currently back to baseline. Neuro exam showed no acute neurological deficits. Patient agreeable to sleep smart study trial and will be tested for OSA tonight.  CT Head Code Stroke: No acute intracranial abnormalities. ASPECTS 10 CT Angio: No intracranial large vessel occlusion or correctable proximal stenosis. Widespread distal vessel atherosclerotic irregularity. Extensive calcified plaque at the carotid bifurcation on the left with masslike calcified plaque within the ICA bulb resulting in near occlusion (>90% stenosis) MRI: Scattered punctate acute infarcts in the left ACA-MCA watershed territory. LDL 53, A1c 5.1 Echo EF 65-70% Carotid U/S: R ICA 1-39% stenosis, L ICA 80-99% stenosis. Plan for L CEA for 04/25/21 per vascular surgery.  Vitals:   04/19/21 0800 04/19/21 0800 04/19/21 0826 04/19/21 1121  BP: (!) 142/57  (!) 148/63   Pulse: 67  64   Resp: 11  14   Temp:  98.6 F (37 C) 99.1 F (37.3 C) 98.1 F (36.7 C)  TempSrc:  Oral Oral Oral  SpO2: 99%  100% 100%  Weight:      Height:       CBC:  Recent Labs  Lab 04/18/21 1315 04/18/21 1321 04/19/21 0800  WBC 8.7  --  6.5  NEUTROABS 6.1  --   --   HGB 10.4* 11.9* 9.7*  HCT 34.3* 35.0* 31.6*  MCV 75.9*  --  73.8*  PLT 263  --  657   Basic Metabolic Panel:  Recent Labs  Lab 04/18/21 1315 04/18/21 1321 04/19/21 0800 04/19/21 1330  NA 139 139 140  --   K 3.4* 3.3* 2.6* 2.7*  CL 104 103 104  --   CO2 26  --  28  --   GLUCOSE 106* 105* 99  --   BUN 14 16 8   --   CREATININE 1.24 1.20 0.98  --   CALCIUM 8.6*  --  8.6*  --   MG  --   --  1.7  --   PHOS  --   --  3.2  --    Lipid Panel:  Recent Labs  Lab 04/18/21 1529  CHOL  95  TRIG 50  HDL 32*  CHOLHDL 3.0  VLDL 10  LDLCALC 53   HgbA1c:  Recent Labs  Lab 04/18/21 1529  HGBA1C 5.1   Urine Drug Screen: No results for input(s): LABOPIA, COCAINSCRNUR, LABBENZ, AMPHETMU, THCU, LABBARB in the last 168 hours.  Alcohol Level No results for input(s): ETH in the last 168 hours.  IMAGING past 24 hours MR BRAIN WO CONTRAST  Result Date: 04/18/2021 CLINICAL DATA:  TIA, follow-up, slurred speech and aphasia EXAM: MRI HEAD WITHOUT CONTRAST TECHNIQUE: Multiplanar, multiecho pulse sequences of the brain and surrounding structures were obtained without intravenous contrast. COMPARISON:  No prior MRI, correlation is made with CT and CTA head neck 04/18/2021 FINDINGS: Brain: Scattered punctate foci of restricted diffusion with ADC correlates in the left frontal and parietal lobes, in the ACA-PCA watershed territory (series 5, images 86-91). No other areas of restricted diffusion. No acute hemorrhage, mass, mass effect, or midline shift. No hydrocephalus or extra-axial collection. No foci of hemosiderin deposition to suggest remote hemorrhage. Scattered and confluent T2 hyperintense signal in the periventricular white matter, likely the sequela  of moderate chronic small vessel ischemic disease. Vascular: Normal flow voids. Skull and upper cervical spine: Normal marrow signal. Sinuses/Orbits: Negative. Other: None. IMPRESSION: Scattered punctate acute infarcts in the left ACA-PCA watershed territory. Electronically Signed   By: Merilyn Baba M.D.   On: 04/18/2021 19:07   ECHOCARDIOGRAM COMPLETE  Result Date: 04/19/2021    ECHOCARDIOGRAM REPORT   Patient Name:   Aaron Moss Date of Exam: 04/19/2021 Medical Rec #:  [id number]       Height:       66.0 in Accession #:    [id number]      Weight:       220.0 lb Date of Birth:  1945-05-16       BSA:          2.082 m Patient Age:    76 years        BP:           148/63 mmHg Patient Gender: M               HR:           65 bpm. Exam  Location:  Inpatient Procedure: 2D Echo, Cardiac Doppler and Color Doppler Indications:    TIA G45.9  History:        Patient has no prior history of Echocardiogram examinations.                 Risk Factors:Hypertension and Dyslipidemia.  Sonographer:    Bernadene Person RDCS Referring Phys: [id number] RONDELL A SMITH IMPRESSIONS  1. Left ventricular ejection fraction, by estimation, is 65 to 70%. The left ventricle has normal function. The left ventricle has no regional wall motion abnormalities. Left ventricular diastolic parameters were normal.  2. Right ventricular systolic function is normal. The right ventricular size is normal. There is normal pulmonary artery systolic pressure.  3. The mitral valve is normal in structure. No evidence of mitral valve regurgitation. No evidence of mitral stenosis.  4. The aortic valve is normal in structure. Aortic valve regurgitation is not visualized. No aortic stenosis is present.  5. The inferior vena cava is normal in size with greater than 50% respiratory variability, suggesting right atrial pressure of 3 mmHg. Conclusion(s)/Recommendation(s): No intracardiac source of embolism detected on this transthoracic study. Consider a transesophageal echocardiogram to exclude cardiac source of embolism if clinically indicated. FINDINGS  Left Ventricle: Left ventricular ejection fraction, by estimation, is 65 to 70%. The left ventricle has normal function. The left ventricle has no regional wall motion abnormalities. The left ventricular internal cavity size was normal in size. There is  no left ventricular hypertrophy. Left ventricular diastolic parameters were normal. Right Ventricle: The right ventricular size is normal. No increase in right ventricular wall thickness. Right ventricular systolic function is normal. There is normal pulmonary artery systolic pressure. The tricuspid regurgitant velocity is 2.33 m/s, and  with an assumed right atrial pressure of 3 mmHg, the estimated  right ventricular systolic pressure is 01.0 mmHg. Left Atrium: Left atrial size was normal in size. Right Atrium: Right atrial size was normal in size. Pericardium: There is no evidence of pericardial effusion. Mitral Valve: The mitral valve is normal in structure. No evidence of mitral valve regurgitation. No evidence of mitral valve stenosis. Tricuspid Valve: The tricuspid valve is normal in structure. Tricuspid valve regurgitation is trivial. No evidence of tricuspid stenosis. Aortic Valve: The aortic valve is normal in structure. Aortic valve regurgitation is not visualized. No aortic stenosis is present.  Pulmonic Valve: The pulmonic valve was normal in structure. Pulmonic valve regurgitation is not visualized. No evidence of pulmonic stenosis. Aorta: The aortic root is normal in size and structure. Venous: The inferior vena cava is normal in size with greater than 50% respiratory variability, suggesting right atrial pressure of 3 mmHg. IAS/Shunts: No atrial level shunt detected by color flow Doppler.  LEFT VENTRICLE PLAX 2D LVIDd:         5.10 cm     Diastology LVIDs:         3.50 cm     LV e' medial:    8.18 cm/s LV PW:         1.00 cm     LV E/e' medial:  13.8 LV IVS:        1.10 cm     LV e' lateral:   9.15 cm/s LVOT diam:     2.00 cm     LV E/e' lateral: 12.3 LV SV:         86 LV SV Index:   41 LVOT Area:     3.14 cm  LV Volumes (MOD) LV vol d, MOD A2C: 90.8 ml LV vol d, MOD A4C: 61.2 ml LV vol s, MOD A2C: 35.5 ml LV vol s, MOD A4C: 23.3 ml LV SV MOD A2C:     55.3 ml LV SV MOD A4C:     61.2 ml LV SV MOD BP:      45.2 ml RIGHT VENTRICLE RV S prime:     14.30 cm/s TAPSE (M-mode): 1.8 cm LEFT ATRIUM             Index        RIGHT ATRIUM           Index LA diam:        3.60 cm 1.73 cm/m   RA Area:     14.60 cm LA Vol (A2C):   50.7 ml 24.35 ml/m  RA Volume:   36.90 ml  17.72 ml/m LA Vol (A4C):   41.3 ml 19.83 ml/m LA Biplane Vol: 46.6 ml 22.38 ml/m  AORTIC VALVE LVOT Vmax:   129.00 cm/s LVOT Vmean:   74.700 cm/s LVOT VTI:    0.273 m  AORTA Ao Root diam: 3.10 cm Ao Asc diam:  3.50 cm MITRAL VALVE                TRICUSPID VALVE MV Area (PHT): 3.65 cm     TR Peak grad:   21.7 mmHg MV Decel Time: 208 msec     TR Vmax:        233.00 cm/s MV E velocity: 113.00 cm/s MV A velocity: 78.10 cm/s   SHUNTS MV E/A ratio:  1.45         Systemic VTI:  0.27 m                             Systemic Diam: 2.00 cm Candee Furbish MD Electronically signed by Candee Furbish MD Signature Date/Time: 04/19/2021/11:35:30 AM    Final    VAS US CAROTID  Result Date: 04/19/2021 Carotid Arterial Duplex Study Patient Name:  DUILIO HERITAGE  Date of Exam:   04/19/2021 Medical Rec #: [id number]        Accession #:    [id number] Date of Birth: [birth year]        Patient Gender: M Patient Age:   61 years Exam [facility] Procedure:      VAS US CAROTID Referring Phys: Xela Oregel --------------------------------------------------------------------------------  Indications:       CVA, Carotid artery disease and Left ICA stenosis seen on                    CTA. Risk Factors:      Hypertension, hyperlipidemia. Comparison Study:  No prior study Performing Technologist: Maudry Mayhew MHA, RDMS, RVT, RDCS  Examination Guidelines: A complete evaluation includes B-mode imaging, spectral Doppler, color Doppler, and power Doppler as needed of all accessible portions of each vessel. Bilateral testing is considered an integral part of a complete examination. Limited examinations for reoccurring indications may be performed as noted.  Right Carotid Findings: +----------+--------+--------+--------+---------------------+------------------+             PSV cm/s EDV cm/s Stenosis Plaque Description    Comments            +----------+--------+--------+--------+---------------------+------------------+  CCA Prox   121      12                                      intimal thickening   +----------+--------+--------+--------+---------------------+------------------+  CCA Distal 98       15                heterogenous, smooth                                                             and calcific                              +----------+--------+--------+--------+---------------------+------------------+  ICA Prox   69       14                heterogenous,         Shadowing                                                  calcific and focal                        +----------+--------+--------+--------+---------------------+------------------+  ICA Distal 139      31                                                          +----------+--------+--------+--------+---------------------+------------------+  ECA        143      10                                                          +----------+--------+--------+--------+---------------------+------------------+ +----------+--------+-------+----------------+-------------------+  PSV cm/s EDV cms Describe         Arm Pressure (mmHG)  +----------+--------+-------+----------------+-------------------+  Subclavian 107              Multiphasic, WNL                      +----------+--------+-------+----------------+-------------------+ +---------+--------+--+--------+--+---------+  Vertebral PSV cm/s 75 EDV cm/s 13 Antegrade  +---------+--------+--+--------+--+---------+  Left Carotid Findings: +----------+--------+--------+--------+------------------+--------+             PSV cm/s EDV cm/s Stenosis Plaque Description Comments  +----------+--------+--------+--------+------------------+--------+  CCA Prox   121      17                                             +----------+--------+--------+--------+------------------+--------+  CCA Distal 100      12                heterogenous                 +----------+--------+--------+--------+------------------+--------+  ICA Prox   461      99       80-99%                                 +----------+--------+--------+--------+------------------+--------+  ICA Mid    250      17                                             +----------+--------+--------+--------+------------------+--------+  ICA Distal 154      10                                             +----------+--------+--------+--------+------------------+--------+  ECA        33       7                                              +----------+--------+--------+--------+------------------+--------+ +----------+--------+--------+----------------+-------------------+             PSV cm/s EDV cm/s Describe         Arm Pressure (mmHG)  +----------+--------+--------+----------------+-------------------+  Subclavian 155               Multiphasic, WNL                      +----------+--------+--------+----------------+-------------------+ +---------+--------+--+--------+--+---------+  Vertebral PSV cm/s 77 EDV cm/s 23 Antegrade  +---------+--------+--+--------+--+---------+   Summary: Right Carotid: Velocities in the right ICA are consistent with a 1-39% stenosis. Left Carotid: Velocities in the left ICA are consistent with a 80-99% stenosis               by peak systolic velocities and plaque morphology. Vertebrals:  Bilateral vertebral arteries demonstrate antegrade flow. Subclavians: Normal flow hemodynamics were seen in bilateral subclavian              arteries. *See table(s) above for measurements and observations.  Preliminary     PHYSICAL EXAM  Temp:  [98.1 F (36.7 C)-99.1 F (37.3 C)] 98.1 F (36.7 C) (01/31 1121) Pulse Rate:  [59-82] 64 (01/31 0826) Resp:  [11-28] 14 (01/31 0826) BP: (129-170)/(46-66) 148/63 (01/31 0826) SpO2:  [92 %-100 %] 100 % (01/31 1121) Weight:  [99.8 kg] 99.8 kg (01/30 2052)  General - Well nourished, well developed, in no apparent distress.  Ophthalmologic - fundi not visualized due to noncooperation.  Cardiovascular - Regular rhythm and rate.  Mental Status -  Level of arousal and  orientation to time, place, and person were intact. Language including expression, naming, repetition, comprehension was assessed and found intact. Attention span and concentration were normal. Recent and remote memory were intact. Fund of Knowledge was assessed and was intact.  Cranial Nerves II - XII - II - Visual field intact OU. III, IV, VI - Extraocular movements intact. V - Facial sensation intact bilaterally. VII - Facial movement intact bilaterally. VIII - Hearing & vestibular intact bilaterally. X - Palate elevates symmetrically. XI - Chin turning & shoulder shrug intact bilaterally. XII - Tongue protrusion intact.  Motor Strength - The patients strength was normal in all extremities and pronator drift was absent.  Bulk was normal and fasciculations were absent.   Motor Tone - Muscle tone was assessed at the neck and appendages and was normal.  Reflexes - The patients reflexes were symmetrical in all extremities and he had no pathological reflexes.  Sensory - Light touch, temperature/pinprick were assessed and were symmetrical.    Coordination - The patient had normal movements in the hands and feet with no ataxia or dysmetria.  Tremor was absent.  Gait and Station - deferred.  NIHSS 0 Premorbid modified Rankin score 0  ASSESSMENT/PLAN [name] is a 76 y.o. male with history of hypertension, hyperlipidemia, prostate cancer s/p radiation therapy, colon cancer in 2000, and adenocarcinoma of the duodenum diagnosed 03/18/2021  presenting with single episode of dizziness, RUE weakness and dysarthria.   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 ASPECTS 10 CT Angio: No intracranial large vessel occlusion or correctable proximal stenosis. Widespread distal vessel atherosclerotic irregularity. Extensive calcified plaque at the carotid bifurcation on the  left with masslike calcified plaque within the ICA bulb resulting in near occlusion (>90% stenosis) MRI: Scattered punctate acute infarcts in the left ACA-MCA watershed territory. Echo EF 65-70% Carotid U/S: R ICA 1-39% stenosis, L ICA 80-99% stenosis. Plan for L CEA for 04/25/21 per vascular surgery. LDL 53 HgbA1c 5.1 VTE prophylaxis - Lovenox    Diet   Diet Heart Room service appropriate? Yes; Fluid consistency: Thin   No antithrombotic prior to admission, now on aspirin 81 mg daily and clopidogrel 75 mg daily x3 weeks then ASA alone Therapy recommendations:  pending Disposition:  pending  Hypertension Home meds:  Norvasc, HCTZ, Coreg, Benazepril Permissive hypertension (OK if < 220/120) but gradually normalize in 5-7 days Long-term BP goal normotensive  Hyperlipidemia Home meds:  Lipitor, resumed in hospital LDL 53, goal < 70  Continue statin at discharge   Other Stroke Risk Factors Advanced Age >/= 43  Obesity, Body mass index is 35.51 kg/m., BMI >/= 30 associated with increased stroke risk, recommend weight loss, diet and exercise as appropriate   Other Active Problems Duodenal Cancer Hypokalemia Iron Deficiency Anemia Left ICA stenosis  Hospital day # 0  France Ravens, MD PGY1 Resident  I have personally obtained history,examined this patient, reviewed notes, independently viewed imaging studies, participated in medical decision making and plan of care.ROS completed by me personally and pertinent positives fully documented  I have made any additions or clarifications directly to the above note. Agree with note above.  Patient presented with transient right-sided symptoms secondary to watershed left ACA/MCA infarcts from symptomatic high-grade proximal left carotid stenosis.  Recommend aspirin Plavix for 3 weeks followed by aspirin alone.  Patient is scheduled for elective left carotid revascularization early next week per vascular surgery.  Patient also appears to be at  risk for sleep apnea and may benefit with consideration for participation in the sleep smart stroke prevention study.  He will be given information to review and decide.  Continue ongoing stroke work-up and aggressive risk factor modification.  Greater than 50% time during this 50-minute visit was spent on counseling and coordination of care and discussion with patient and care team and answering questions.  Antony Contras, MD Medical Director [facility] Pager: [phone] 04/19/2021 4:12 PM  To contact Stroke Continuity provider, please refer to [email or web address] After hours, contact General Neurology

## 2021-04-19 NOTE — Evaluation (Signed)
Occupational Therapy Evaluation Patient Details Name: Aaron Moss MRN: [id number] DOB: 09/20/1945 Today's Date: 04/19/2021   History of Present Illness Pt is a 76 y/o male presenting 1/30 after fall at home, slurred speech and R UE weakness.  CT head negative, CTA with L internal carotid stenosis, plan for endarterectomy 04/25/21. 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  PMH includes: colon cancer, glaucoma, HTN, prostate cancer, iron deficiency anemia.   Clinical Impression   PTA patient independent and driving. Admitted for above and demonstrates independence with LB dressing, grooming, transfers and mobility in room.  No deficits seen with strength, coordination, vision, or balance. Pt reports feeling back to baseline, noted decreased STM with 3 word recall.  Further cog assessment deferred as echo arrived in room.  Recommend continued OT services acutely to complete further cognitive assessment with pill box test. Discussed with RN recommendations for med mgmt and IADLs at this time. Will follow.      Recommendations for follow up therapy are one component of a multi-disciplinary discharge planning process, led by the attending physician.  Recommendations may be updated based on patient status, additional functional criteria and insurance authorization.   Follow Up Recommendations  No OT follow up    Assistance Recommended at Discharge PRN  Patient can return home with the following Direct supervision/assist for medications management;Assist for transportation;Assistance with cooking/housework    Functional Status Assessment     Equipment Recommendations  None recommended by OT    Recommendations for Other Services       Precautions / Restrictions Precautions Precautions: None Restrictions Weight Bearing Restrictions: No      Mobility Bed Mobility Overal bed mobility: Independent                  Transfers                           Balance Overall balance assessment: No apparent balance deficits (not formally assessed)                                         ADL either performed or assessed with clinical judgement   ADL Overall ADL's : Modified independent                                       General ADL Comments: demonstrates modified independence for transfers, mobility in room, LB dressing and grooming     Vision Baseline Vision/History: 1 Wears glasses (reading) Ability to See in Adequate Light: 0 Adequate Vision Assessment?: No apparent visual deficits     Perception     Praxis      Pertinent Vitals/Pain Pain Assessment Pain Assessment: No/denies pain     Hand Dominance Left   Extremity/Trunk Assessment Upper Extremity Assessment Upper Extremity Assessment: Overall WFL for tasks assessed   Lower Extremity Assessment Lower Extremity Assessment: Defer to PT evaluation       Communication Communication Communication: No difficulties   Cognition Arousal/Alertness: Awake/alert Behavior During Therapy: WFL for tasks assessed/performed Overall Cognitive Status: Impaired/Different from baseline Area of Impairment: Memory                     Memory: Decreased short-term memory  General Comments: pt reports STM deficits at baseline     General Comments  discussed with RN recommendations of assist with med mgmt from spouse due to STM deficits, especially if medication changes occur.  Pt reports STM deficts are at baseline. No further cog assessment completed d/t echo arriving.    Exercises     Shoulder Instructions      Home Living Family/patient expects to be discharged to:: Private residence Living Arrangements: Spouse/significant other   Type of Home: House Home Access: Stairs to enter Technical brewer of Steps: 2 Entrance Stairs-Rails: Can reach both Home Layout: One level     Bathroom Shower/Tub:  Occupational psychologist: Handicapped height     Home Equipment: None          Prior Functioning/Environment Prior Level of Function : Independent/Modified Independent               ADLs Comments: driving, yardwork; enjoys fishing and working on cars        OT Problem List: Decreased cognition      OT Treatment/Interventions: Self-care/ADL training;Cognitive remediation/compensation;Patient/family education;Therapeutic activities    OT Goals(Current goals can be found in the care plan section) Acute Rehab OT Goals Patient Stated Goal: home OT Goal Formulation: With patient Time For Goal Achievement: 05/03/21 Potential to Achieve Goals: Good  OT Frequency: Min 2X/week    Co-evaluation              AM-PAC OT "6 Clicks" Daily Activity     Outcome Measure Help from another person eating meals?: None Help from another person taking care of personal grooming?: None Help from another person toileting, which includes using toliet, bedpan, or urinal?: None Help from another person bathing (including washing, rinsing, drying)?: None Help from another person to put on and taking off regular upper body clothing?: None Help from another person to put on and taking off regular lower body clothing?: None 6 Click Score: 24   End of Session Nurse Communication: Mobility status;Other (comment) (recs)  Activity Tolerance: Patient tolerated treatment well Patient left: in bed;with call bell/phone within reach  OT Visit Diagnosis: Other symptoms and signs involving cognitive function;Other abnormalities of gait and mobility (R26.89)                Time: 7517-0017 OT Time Calculation (min): 13 min Charges:  OT General Charges $OT Visit: 1 Visit OT Evaluation $OT Eval Low Complexity: 1 Low  Jolaine Artist, OT Acute Rehabilitation Services Pager [phone] Office [phone]'   Delight Stare 04/19/2021, 9:23 AM

## 2021-04-19 NOTE — TOC Transition Note (Addendum)
Transition of Care [facility]) - CM/SW Discharge Note   Patient Details  Name: Aaron Moss MRN: [id number] Date of Birth: 17-Nov-1945  Transition of Care [facility]) CM/SW Contact:  Pollie Friar, RN Phone Number: 04/19/2021, 11:35 AM   Clinical Narrative:    Patient is discharging home with self care.  No needs per TOC. SPC ordered through [facility] and will be delivered to the room.    Final next level of care: Home/Self Care Barriers to Discharge: No Barriers Identified   Patient Goals and CMS Choice        Discharge Placement                       Discharge Plan and Services                                     Social Determinants of Health (SDOH) Interventions     Readmission Risk Interventions No flowsheet data found.

## 2021-04-19 NOTE — Progress Notes (Signed)
[facility]  Subjective  -no additional neurologic events overnight.   Objective (!) 148/63 64 99.1 F (37.3 C) (Oral) 14 100%  Intake/Output Summary (Last 24 hours) at 04/19/2021 0841 Last data filed at 04/19/2021 0530 Gross per 24 hour  Intake --  Output 1460 ml  Net -1460 ml    Grossly neurologically intact. Cranial nerves II through XII grossly intact  Laboratory Lab Results: Recent Labs    04/18/21 1315 04/18/21 1321  WBC 8.7  --   HGB 10.4* 11.9*  HCT 34.3* 35.0*  PLT 263  --    BMET Recent Labs    04/18/21 1315 04/18/21 1321  NA 139 139  K 3.4* 3.3*  CL 104 103  CO2 26  --   GLUCOSE 106* 105*  BUN 14 16  CREATININE 1.24 1.20  CALCIUM 8.6*  --     COAG Lab Results  Component Value Date   INR 1.1 04/18/2021   No results found for: PTT  Assessment/Planning:   76 y.o. male  with history of hypertension, hyperlipidemia, prostate cancer, colon cancer, and recently diagnosed duodenal adenocarcinoma that vascular surgery was consulted yesterday evening in the ED for a high-grade calcified left internal carotid stenosis >90%.  MRI confirms left brain event with scattered punctate infarcts left ACA/PCA.  I have recommended left carotid revascularization after review of his CTA and agree this is likely the etiology for his slurred speech and right upper extremity weakness.  I worry about his tolerance for dual antiplatelet therapy with duodenal adenocarcinoma and his need for surgical intervention on his duodenal cancer and in addition the lesion is very calcified that could making stenting difficult.  I have recommended left carotid endarterectomy.  I have put him on the schedule for left carotid with me on Monday 04/25/21.  Can continue aspirin Plavix perioperative.  If he is otherwise cleared for discharge he can go home and come back for surgery on Monday.  Marty Heck 04/19/2021 8:41 AM --

## 2021-04-19 NOTE — Progress Notes (Signed)
°  Echocardiogram 2D Echocardiogram has been performed.  Aaron Moss 04/19/2021, 9:42 AM

## 2021-04-20 ENCOUNTER — Other Ambulatory Visit: Payer: Self-pay

## 2021-04-20 DIAGNOSIS — I6522 Occlusion and stenosis of left carotid artery: Secondary | ICD-10-CM

## 2021-04-21 NOTE — Pre-Procedure Instructions (Signed)
Surgical Instructions    Your procedure is scheduled on Monday 04/25/21.   Report to [facility] Main Entrance "A" at 07:30 A.M., then check in with the Admitting office.  Call this number if you have problems the morning of surgery:  [phone]   If you have any questions prior to your surgery date call [phone]: Open Monday-Friday 8am-4pm    Remember:  Do not eat or drink after midnight the night before your surgery     Take these medicines the morning of surgery with A SIP OF WATER:   amLODipine (NORVASC)  atorvastatin (LIPITOR)  carvedilol (COREG)   pantoprazole (PROTONIX)   Take these medicines if needed:   traMADol (ULTRAM)   Please follow your surgeon's instructions regarding Aspirin and clopidogrel (PLAVIX). If you have not received instructions then please contact your surgeon's office for instructions.   As of today, STOP taking any Aspirin (unless otherwise instructed by your surgeon) Aleve, Naproxen, Ibuprofen, Motrin, Advil, Goody's, BC's, all herbal medications, fish oil, and all vitamins.  After your COVID test   You are not required to quarantine however you are required to wear a well-fitting mask when you are out and around people not in your household.  If your mask becomes wet or soiled, replace with a new one.  Wash your hands often with soap and water for 20 seconds or clean your hands with an alcohol-based hand sanitizer that contains at least 60% alcohol.  Do not share personal items.  Notify your provider: if you are in close contact with someone who has COVID  or if you develop a fever of 100.4 or greater, sneezing, cough, sore throat, shortness of breath or body aches.           Do not wear jewelry or makeup Do not wear lotions, powders, perfumes/colognes, or deodorant. Do not shave 48 hours prior to surgery.  Men may shave face and neck. Do not bring valuables to the hospital. Do not wear nail polish, gel polish, artificial nails, or any  other type of covering on natural nails (fingers and toes) If you have artificial nails or gel coating that need to be removed by a nail salon, please have this removed prior to surgery. Artificial nails or gel coating may interfere with anesthesia's ability to adequately monitor your vital signs.             [facility] is not responsible for any belongings or valuables.  Do NOT Smoke (Tobacco/Vaping)  24 hours prior to your procedure  If you use a CPAP at night, you may bring your mask for your overnight stay.   Contacts, glasses, hearing aids, dentures or partials may not be worn into surgery, please bring cases for these belongings   For patients admitted to the hospital, discharge time will be determined by your treatment team.   Patients discharged the day of surgery will not be allowed to drive home, and someone needs to stay with them for 24 hours.  NO VISITORS WILL BE ALLOWED IN PRE-OP WHERE PATIENTS ARE PREPPED FOR SURGERY.  ONLY 1 SUPPORT PERSON MAY BE PRESENT IN THE WAITING ROOM WHILE YOU ARE IN SURGERY.  IF YOU ARE TO BE ADMITTED, ONCE YOU ARE IN YOUR ROOM YOU WILL BE ALLOWED TWO (2) VISITORS. 1 (ONE) VISITOR MAY STAY OVERNIGHT BUT MUST ARRIVE TO THE ROOM BY 8pm.  Minor children may have two parents present. Special consideration for safety and communication needs will be reviewed on a case by  case basis.  Special instructions:    Oral Hygiene is also important to reduce your risk of infection.  Remember - BRUSH YOUR TEETH THE MORNING OF SURGERY WITH YOUR REGULAR TOOTHPASTE   - Preparing For Surgery  Before surgery, you can play an important role. Because skin is not sterile, your skin needs to be as free of germs as possible. You can reduce the number of germs on your skin by washing with CHG (chlorahexidine gluconate) Soap before surgery.  CHG is an antiseptic cleaner which kills germs and bonds with the skin to continue killing germs even after washing.      Please do not use if you have an allergy to CHG or antibacterial soaps. If your skin becomes reddened/irritated stop using the CHG.  Do not shave (including legs and underarms) for at least 48 hours prior to first CHG shower. It is OK to shave your face.  Please follow these instructions carefully.     Shower the NIGHT BEFORE SURGERY and the MORNING OF SURGERY with CHG Soap.   If you chose to wash your hair, wash your hair first as usual with your normal shampoo. After you shampoo, rinse your hair and body thoroughly to remove the shampoo.  Then [employer] and genitals (private parts) with your normal soap and rinse thoroughly to remove soap.  After that Use CHG Soap as you would any other liquid soap. You can apply CHG directly to the skin and wash gently with a scrungie or a clean washcloth.   Apply the CHG Soap to your body ONLY FROM THE NECK DOWN.  Do not use on open wounds or open sores. Avoid contact with your eyes, ears, mouth and genitals (private parts). Wash Face and genitals (private parts)  with your normal soap.   Wash thoroughly, paying special attention to the area where your surgery will be performed.  Thoroughly rinse your body with warm water from the neck down.  DO NOT shower/wash with your normal soap after using and rinsing off the CHG Soap.  Pat yourself dry with a CLEAN TOWEL.  Wear CLEAN PAJAMAS to bed the night before surgery  Place CLEAN SHEETS on your bed the night before your surgery  DO NOT SLEEP WITH PETS.   Day of Surgery:  Take a shower with CHG soap. Wear Clean/Comfortable clothing the morning of surgery Do not apply any deodorants/lotions.   Remember to brush your teeth WITH YOUR REGULAR TOOTHPASTE.   Please read over the following fact sheets that you were given.

## 2021-04-22 ENCOUNTER — Encounter (HOSPITAL_COMMUNITY): Payer: Self-pay

## 2021-04-22 ENCOUNTER — Other Ambulatory Visit: Payer: Self-pay

## 2021-04-22 ENCOUNTER — Encounter (HOSPITAL_COMMUNITY)
Admission: RE | Admit: 2021-04-22 | Discharge: 2021-04-22 | Disposition: A | Discharge status: Home / Self Care | Payer: Medicare Other | Source: Ambulatory Visit | Attending: Vascular Surgery | Admitting: Vascular Surgery

## 2021-04-22 VITALS — BP 162/62 | HR 82 | Temp 98.6°F | Resp 17 | Ht 66.0 in | Wt 222.2 lb

## 2021-04-22 DIAGNOSIS — I6522 Occlusion and stenosis of left carotid artery: Secondary | ICD-10-CM

## 2021-04-22 DIAGNOSIS — Z01818 Encounter for other preprocedural examination: Secondary | ICD-10-CM

## 2021-04-22 HISTORY — DX: Occlusion and stenosis of left carotid artery: I65.22

## 2021-04-22 HISTORY — DX: Atherosclerotic heart disease of native coronary artery without angina pectoris: I25.10

## 2021-04-22 HISTORY — DX: Transient cerebral ischemic attack, unspecified: G45.9

## 2021-04-22 LAB — COMPREHENSIVE METABOLIC PANEL
ALT: 17 U/L (ref 0–44)
AST: 19 U/L (ref 15–41)
Albumin: 3.1 g/dL — ABNORMAL LOW (ref 3.5–5.0)
Alkaline Phosphatase: 95 U/L (ref 38–126)
Anion gap: 6 (ref 5–15)
BUN: 13 mg/dL (ref 8–23)
CO2: 23 mmol/L (ref 22–32)
Calcium: 8.9 mg/dL (ref 8.9–10.3)
Chloride: 112 mmol/L — ABNORMAL HIGH (ref 98–111)
Creatinine, Ser: 1.1 mg/dL (ref 0.61–1.24)
GFR, Estimated: >60 mL/min (ref >60)
Glucose, Bld: 118 mg/dL — ABNORMAL HIGH (ref 70–99)
Potassium: 3.7 mmol/L (ref 3.5–5.1)
Sodium: 141 mmol/L (ref 135–145)
Total Bilirubin: 0.9 mg/dL (ref 0.3–1.2)
Total Protein: 6.9 g/dL (ref 6.5–8.1)

## 2021-04-22 LAB — SARS CORONAVIRUS 2 (TAT 6-24 HRS): SARS Coronavirus 2: NEGATIVE

## 2021-04-22 LAB — URINALYSIS, ROUTINE W REFLEX MICROSCOPIC
Bilirubin Urine: NEGATIVE
Glucose, UA: NEGATIVE mg/dL
Hgb urine dipstick: NEGATIVE
Ketones, ur: NEGATIVE mg/dL
Leukocytes,Ua: NEGATIVE
Nitrite: NEGATIVE
Protein, ur: NEGATIVE mg/dL
Specific Gravity, Urine: 1.02 (ref 1.005–1.030)
pH: 5.5 (ref 5.0–8.0)

## 2021-04-22 LAB — PROTIME-INR
INR: 1.1 (ref 0.8–1.2)
Prothrombin Time: 13.9 seconds (ref 11.4–15.2)

## 2021-04-22 LAB — CBC
HCT: 35.7 % — ABNORMAL LOW (ref 39.0–52.0)
Hemoglobin: 10.5 g/dL — ABNORMAL LOW (ref 13.0–17.0)
MCH: 22.5 pg — ABNORMAL LOW (ref 26.0–34.0)
MCHC: 29.4 g/dL — ABNORMAL LOW (ref 30.0–36.0)
MCV: 76.4 fL — ABNORMAL LOW (ref 80.0–100.0)
Platelets: 320 10*3/uL (ref 150–400)
RBC: 4.67 MIL/uL (ref 4.22–5.81)
RDW: 18.5 % — ABNORMAL HIGH (ref 11.5–15.5)
WBC: 8.9 10*3/uL (ref 4.0–10.5)
nRBC: 0 % (ref 0.0–0.2)

## 2021-04-22 LAB — APTT: aPTT: 28 seconds (ref 24–36)

## 2021-04-22 LAB — SURGICAL PCR SCREEN
MRSA, PCR: NEGATIVE
Staphylococcus aureus: NEGATIVE

## 2021-04-22 NOTE — Progress Notes (Signed)
PCP - Dr. Cathlean Cower Cardiologist - Dr. Kirk Ruths, new patient  PPM/ICD - n/a Device Orders -  Rep Notified -   Chest x-ray - n/a EKG - 04/18/21 Stress Test - patient denies ECHO - 04/19/21 Cardiac Cath - patient denies  Sleep Study - patient denies CPAP -   Fasting Blood Sugar - n/a Checks Blood Sugar _____ times a day  Blood Thinner Instructions: Plavix, to continue Aspirin Instructions: to continue  ERAS Protcol - npo per order PRE-SURGERY Ensure or G2-   COVID TEST- at PAT appointment   Anesthesia review: yes, Jeneen Rinks made aware of recent hospitalization  Patient denies shortness of breath, fever, cough and chest pain at PAT appointment   All instructions explained to the patient, with a verbal understanding of the material. Patient agrees to go over the instructions while at home for a better understanding. Patient also instructed to wear a mask when in public after being tested for COVID-19. The opportunity to ask questions was provided.

## 2021-04-25 ENCOUNTER — Inpatient Hospital Stay (HOSPITAL_COMMUNITY): Payer: Medicare Other | Admitting: Certified Registered Nurse Anesthetist

## 2021-04-25 ENCOUNTER — Encounter (HOSPITAL_COMMUNITY): Payer: Self-pay | Admitting: Vascular Surgery

## 2021-04-25 ENCOUNTER — Encounter (HOSPITAL_COMMUNITY)
Admission: RE | Disposition: A | Discharge status: Home / Self Care | Payer: Self-pay | Source: Home / Self Care | Attending: Vascular Surgery

## 2021-04-25 ENCOUNTER — Other Ambulatory Visit: Payer: Self-pay

## 2021-04-25 ENCOUNTER — Inpatient Hospital Stay (HOSPITAL_COMMUNITY)
Admission: RE | Admit: 2021-04-25 | Discharge: 2021-04-26 | DRG: 038 | Disposition: A | Discharge status: Home / Self Care | Payer: Medicare Other | Attending: Vascular Surgery | Admitting: Vascular Surgery

## 2021-04-25 ENCOUNTER — Inpatient Hospital Stay (HOSPITAL_COMMUNITY): Payer: Medicare Other | Admitting: Physician Assistant

## 2021-04-25 DIAGNOSIS — E785 Hyperlipidemia, unspecified: Secondary | ICD-10-CM | POA: Diagnosis present

## 2021-04-25 DIAGNOSIS — Z87891 Personal history of nicotine dependence: Secondary | ICD-10-CM | POA: Diagnosis not present

## 2021-04-25 DIAGNOSIS — Z85038 Personal history of other malignant neoplasm of large intestine: Secondary | ICD-10-CM

## 2021-04-25 DIAGNOSIS — C170 Malignant neoplasm of duodenum: Secondary | ICD-10-CM | POA: Diagnosis present

## 2021-04-25 DIAGNOSIS — I10 Essential (primary) hypertension: Secondary | ICD-10-CM | POA: Diagnosis present

## 2021-04-25 DIAGNOSIS — G8321 Monoplegia of upper limb affecting right dominant side: Secondary | ICD-10-CM | POA: Diagnosis present

## 2021-04-25 DIAGNOSIS — R1310 Dysphagia, unspecified: Secondary | ICD-10-CM | POA: Diagnosis present

## 2021-04-25 DIAGNOSIS — Z8546 Personal history of malignant neoplasm of prostate: Secondary | ICD-10-CM

## 2021-04-25 DIAGNOSIS — Z923 Personal history of irradiation: Secondary | ICD-10-CM

## 2021-04-25 DIAGNOSIS — I6522 Occlusion and stenosis of left carotid artery: Secondary | ICD-10-CM | POA: Diagnosis present

## 2021-04-25 DIAGNOSIS — Z800 Family history of malignant neoplasm of digestive organs: Secondary | ICD-10-CM | POA: Diagnosis not present

## 2021-04-25 DIAGNOSIS — Z8673 Personal history of transient ischemic attack (TIA), and cerebral infarction without residual deficits: Secondary | ICD-10-CM

## 2021-04-25 DIAGNOSIS — I2510 Atherosclerotic heart disease of native coronary artery without angina pectoris: Secondary | ICD-10-CM | POA: Diagnosis present

## 2021-04-25 DIAGNOSIS — Z888 Allergy status to other drugs, medicaments and biological substances status: Secondary | ICD-10-CM | POA: Diagnosis not present

## 2021-04-25 DIAGNOSIS — Z79899 Other long term (current) drug therapy: Secondary | ICD-10-CM

## 2021-04-25 DIAGNOSIS — H409 Unspecified glaucoma: Secondary | ICD-10-CM | POA: Diagnosis present

## 2021-04-25 DIAGNOSIS — K219 Gastro-esophageal reflux disease without esophagitis: Secondary | ICD-10-CM | POA: Diagnosis present

## 2021-04-25 DIAGNOSIS — M1712 Unilateral primary osteoarthritis, left knee: Secondary | ICD-10-CM | POA: Diagnosis present

## 2021-04-25 DIAGNOSIS — Z9221 Personal history of antineoplastic chemotherapy: Secondary | ICD-10-CM

## 2021-04-25 DIAGNOSIS — Y92009 Unspecified place in unspecified non-institutional (private) residence as the place of occurrence of the external cause: Secondary | ICD-10-CM | POA: Diagnosis not present

## 2021-04-25 DIAGNOSIS — W19XXXA Unspecified fall, initial encounter: Secondary | ICD-10-CM | POA: Diagnosis present

## 2021-04-25 DIAGNOSIS — Z20822 Contact with and (suspected) exposure to covid-19: Secondary | ICD-10-CM | POA: Diagnosis present

## 2021-04-25 DIAGNOSIS — I63232 Cerebral infarction due to unspecified occlusion or stenosis of left carotid arteries: Principal | ICD-10-CM | POA: Diagnosis present

## 2021-04-25 HISTORY — PX: PATCH ANGIOPLASTY: SHX6230

## 2021-04-25 HISTORY — PX: ENDARTERECTOMY: SHX5162

## 2021-04-25 LAB — CBC
HCT: 22.9 % — ABNORMAL LOW (ref 39.0–52.0)
Hemoglobin: 7.2 g/dL — ABNORMAL LOW (ref 13.0–17.0)
MCH: 22.7 pg — ABNORMAL LOW (ref 26.0–34.0)
MCHC: 31.4 g/dL (ref 30.0–36.0)
MCV: 72.2 fL — ABNORMAL LOW (ref 80.0–100.0)
Platelets: 230 10*3/uL (ref 150–400)
RBC: 3.17 MIL/uL — ABNORMAL LOW (ref 4.22–5.81)
RDW: 18.2 % — ABNORMAL HIGH (ref 11.5–15.5)
WBC: 9.3 10*3/uL (ref 4.0–10.5)
nRBC: 0 % (ref 0.0–0.2)

## 2021-04-25 LAB — CREATININE, SERUM
Creatinine, Ser: 1.05 mg/dL (ref 0.61–1.24)
GFR, Estimated: >60 mL/min (ref >60)

## 2021-04-25 LAB — ABO/RH: ABO/RH(D): A POS

## 2021-04-25 SURGERY — ENDARTERECTOMY, CAROTID
Anesthesia: General | Site: Neck | Laterality: Left

## 2021-04-25 MED ORDER — SENNOSIDES-DOCUSATE SODIUM 8.6-50 MG PO TABS: 1.0000 | ORAL_TABLET | Freq: Every evening | ORAL | Status: DC | PRN

## 2021-04-25 MED ORDER — FENTANYL CITRATE (PF) 250 MCG/5ML IJ SOLN
INTRAMUSCULAR | Status: AC
  Filled 2021-04-25: qty 5

## 2021-04-25 MED ORDER — SUCCINYLCHOLINE CHLORIDE 200 MG/10ML IV SOSY
PREFILLED_SYRINGE | INTRAVENOUS | Status: AC
  Filled 2021-04-25: qty 10

## 2021-04-25 MED ORDER — ASPIRIN EC 81 MG PO TBEC
81.0000 mg | DELAYED_RELEASE_TABLET | Freq: Every day | ORAL | Status: DC
  Administered 2021-04-26: 81 mg via ORAL
  Filled 2021-04-25: qty 1

## 2021-04-25 MED ORDER — LIDOCAINE 2% (20 MG/ML) 5 ML SYRINGE
INTRAMUSCULAR | Status: AC
  Filled 2021-04-25: qty 5

## 2021-04-25 MED ORDER — ONDANSETRON HCL 4 MG/2ML IJ SOLN: 4.0000 mg | Freq: Once | INTRAMUSCULAR | Status: DC | PRN

## 2021-04-25 MED ORDER — PHENYLEPHRINE HCL-NACL 20-0.9 MG/250ML-% IV SOLN: INTRAVENOUS | Status: DC | PRN

## 2021-04-25 MED ORDER — GLYCOPYRROLATE PF 0.2 MG/ML IJ SOSY
PREFILLED_SYRINGE | INTRAMUSCULAR | Status: AC
  Filled 2021-04-25: qty 1

## 2021-04-25 MED ORDER — LIDOCAINE 2% (20 MG/ML) 5 ML SYRINGE
INTRAMUSCULAR | Status: DC | PRN
  Administered 2021-04-25: 20 mg via INTRAVENOUS

## 2021-04-25 MED ORDER — CEFAZOLIN SODIUM-DEXTROSE 2-4 GM/100ML-% IV SOLN
2.0000 g | INTRAVENOUS | Status: AC
  Administered 2021-04-25: 2 g via INTRAVENOUS
  Filled 2021-04-25: qty 100

## 2021-04-25 MED ORDER — ACETAMINOPHEN 500 MG PO TABS
1000.0000 mg | ORAL_TABLET | Freq: Once | ORAL | Status: AC
  Administered 2021-04-25: 1000 mg via ORAL
  Filled 2021-04-25: qty 2

## 2021-04-25 MED ORDER — DEXAMETHASONE SODIUM PHOSPHATE 10 MG/ML IJ SOLN
INTRAMUSCULAR | Status: DC | PRN
  Administered 2021-04-25: 10 mg via INTRAVENOUS

## 2021-04-25 MED ORDER — ROCURONIUM BROMIDE 10 MG/ML (PF) SYRINGE
PREFILLED_SYRINGE | INTRAVENOUS | Status: AC
  Filled 2021-04-25: qty 10

## 2021-04-25 MED ORDER — CLEVIDIPINE BUTYRATE 0.5 MG/ML IV EMUL: 1.0000 mg/h | INTRAVENOUS | Status: DC

## 2021-04-25 MED ORDER — ONDANSETRON HCL 4 MG/2ML IJ SOLN
INTRAMUSCULAR | Status: AC
  Filled 2021-04-25: qty 2

## 2021-04-25 MED ORDER — ROCURONIUM BROMIDE 10 MG/ML (PF) SYRINGE
PREFILLED_SYRINGE | INTRAVENOUS | Status: DC | PRN
Start: 2021-04-25 — End: 2021-04-25
  Administered 2021-04-25: 100 mg via INTRAVENOUS

## 2021-04-25 MED ORDER — METOPROLOL TARTRATE 5 MG/5ML IV SOLN: 2.0000 mg | INTRAVENOUS | Status: DC | PRN

## 2021-04-25 MED ORDER — HEMOSTATIC AGENTS (NO CHARGE) OPTIME
TOPICAL | Status: DC | PRN
  Administered 2021-04-25: 1 via TOPICAL

## 2021-04-25 MED ORDER — ACETAMINOPHEN 325 MG PO TABS: 325.0000 mg | ORAL_TABLET | ORAL | Status: DC | PRN

## 2021-04-25 MED ORDER — PHENYLEPHRINE 40 MCG/ML (10ML) SYRINGE FOR IV PUSH (FOR BLOOD PRESSURE SUPPORT)
PREFILLED_SYRINGE | INTRAVENOUS | Status: DC | PRN
  Administered 2021-04-25: 80 ug via INTRAVENOUS

## 2021-04-25 MED ORDER — ONDANSETRON HCL 4 MG/2ML IJ SOLN
INTRAMUSCULAR | Status: DC | PRN
  Administered 2021-04-25: 4 mg via INTRAVENOUS

## 2021-04-25 MED ORDER — DOCUSATE SODIUM 100 MG PO CAPS
100.0000 mg | ORAL_CAPSULE | Freq: Every day | ORAL | Status: DC
  Filled 2021-04-25: qty 1

## 2021-04-25 MED ORDER — LACTATED RINGERS IV SOLN: INTRAVENOUS | Status: DC

## 2021-04-25 MED ORDER — PROPOFOL 10 MG/ML IV BOLUS
INTRAVENOUS | Status: DC | PRN
  Administered 2021-04-25: 120 mg via INTRAVENOUS

## 2021-04-25 MED ORDER — HEPARIN SODIUM (PORCINE) 1000 UNIT/ML IJ SOLN
INTRAMUSCULAR | Status: DC | PRN
  Administered 2021-04-25: 10000 [IU] via INTRAVENOUS

## 2021-04-25 MED ORDER — PROPOFOL 10 MG/ML IV BOLUS
INTRAVENOUS | Status: AC
  Filled 2021-04-25: qty 20

## 2021-04-25 MED ORDER — PHENOL 1.4 % MT LIQD: 1.0000 | OROMUCOSAL | Status: DC | PRN

## 2021-04-25 MED ORDER — ACETAMINOPHEN 325 MG RE SUPP
325.0000 mg | RECTAL | Status: DC | PRN
  Filled 2021-04-25: qty 2

## 2021-04-25 MED ORDER — PHENYLEPHRINE 40 MCG/ML (10ML) SYRINGE FOR IV PUSH (FOR BLOOD PRESSURE SUPPORT)
PREFILLED_SYRINGE | INTRAVENOUS | Status: AC
  Filled 2021-04-25: qty 10

## 2021-04-25 MED ORDER — FENTANYL CITRATE (PF) 250 MCG/5ML IJ SOLN
INTRAMUSCULAR | Status: DC | PRN
  Administered 2021-04-25: 50 ug via INTRAVENOUS
  Administered 2021-04-25: 100 ug via INTRAVENOUS

## 2021-04-25 MED ORDER — 0.9 % SODIUM CHLORIDE (POUR BTL) OPTIME
TOPICAL | Status: DC | PRN
  Administered 2021-04-25 (×2): 1000 mL

## 2021-04-25 MED ORDER — SODIUM CHLORIDE 0.9 % IV SOLN: 500.0000 mL | Freq: Once | INTRAVENOUS | Status: DC | PRN

## 2021-04-25 MED ORDER — OXYCODONE HCL 5 MG/5ML PO SOLN: 5.0000 mg | Freq: Once | ORAL | Status: DC | PRN

## 2021-04-25 MED ORDER — SODIUM CHLORIDE 0.9 % IV SOLN: INTRAVENOUS | Status: DC | PRN

## 2021-04-25 MED ORDER — HEPARIN 6000 UNIT IRRIGATION SOLUTION
Status: DC | PRN
  Administered 2021-04-25: 1

## 2021-04-25 MED ORDER — SODIUM CHLORIDE 0.9 % IV SOLN: INTRAVENOUS | Status: DC

## 2021-04-25 MED ORDER — CEFAZOLIN SODIUM-DEXTROSE 2-4 GM/100ML-% IV SOLN
2.0000 g | Freq: Three times a day (TID) | INTRAVENOUS | Status: AC
  Administered 2021-04-25 – 2021-04-26 (×2): 2 g via INTRAVENOUS
  Filled 2021-04-25 (×2): qty 100

## 2021-04-25 MED ORDER — LABETALOL HCL 5 MG/ML IV SOLN: 10.0000 mg | INTRAVENOUS | Status: DC | PRN

## 2021-04-25 MED ORDER — HYDRALAZINE HCL 20 MG/ML IJ SOLN: 5.0000 mg | INTRAMUSCULAR | Status: DC | PRN

## 2021-04-25 MED ORDER — HEPARIN SODIUM (PORCINE) 5000 UNIT/ML IJ SOLN
5000.0000 [IU] | Freq: Three times a day (TID) | INTRAMUSCULAR | Status: DC
  Administered 2021-04-26: 5000 [IU] via SUBCUTANEOUS
  Filled 2021-04-25: qty 1

## 2021-04-25 MED ORDER — PHENYLEPHRINE HCL-NACL 20-0.9 MG/250ML-% IV SOLN
INTRAVENOUS | Status: DC | PRN
  Administered 2021-04-25: 50 ug/min via INTRAVENOUS

## 2021-04-25 MED ORDER — SODIUM CHLORIDE 0.9 % IV SOLN
0.1500 ug/kg/min | INTRAVENOUS | Status: AC
  Administered 2021-04-25: .08 ug/kg/min via INTRAVENOUS
  Filled 2021-04-25: qty 1000

## 2021-04-25 MED ORDER — PANTOPRAZOLE SODIUM 40 MG PO TBEC
40.0000 mg | DELAYED_RELEASE_TABLET | Freq: Every day | ORAL | Status: DC
  Administered 2021-04-26: 40 mg via ORAL
  Filled 2021-04-25: qty 1

## 2021-04-25 MED ORDER — SUGAMMADEX SODIUM 200 MG/2ML IV SOLN
INTRAVENOUS | Status: DC | PRN
  Administered 2021-04-25: 200 mg via INTRAVENOUS

## 2021-04-25 MED ORDER — FENTANYL CITRATE (PF) 100 MCG/2ML IJ SOLN
INTRAMUSCULAR | Status: AC
  Administered 2021-04-25: 50 ug via INTRAVENOUS
  Filled 2021-04-25: qty 2

## 2021-04-25 MED ORDER — AMISULPRIDE (ANTIEMETIC) 5 MG/2ML IV SOLN: 10.0000 mg | Freq: Once | INTRAVENOUS | Status: DC | PRN

## 2021-04-25 MED ORDER — CLOPIDOGREL BISULFATE 75 MG PO TABS
75.0000 mg | ORAL_TABLET | Freq: Every day | ORAL | Status: DC
  Administered 2021-04-26: 75 mg via ORAL
  Filled 2021-04-25: qty 1

## 2021-04-25 MED ORDER — BENAZEPRIL HCL 40 MG PO TABS
40.0000 mg | ORAL_TABLET | Freq: Every day | ORAL | Status: DC
  Administered 2021-04-26: 40 mg via ORAL
  Filled 2021-04-25: qty 1

## 2021-04-25 MED ORDER — DEXAMETHASONE SODIUM PHOSPHATE 10 MG/ML IJ SOLN
INTRAMUSCULAR | Status: AC
  Filled 2021-04-25: qty 1

## 2021-04-25 MED ORDER — ONDANSETRON HCL 4 MG/2ML IJ SOLN: 4.0000 mg | Freq: Four times a day (QID) | INTRAMUSCULAR | Status: DC | PRN

## 2021-04-25 MED ORDER — CHLORHEXIDINE GLUCONATE CLOTH 2 % EX PADS: 6.0000 | MEDICATED_PAD | Freq: Once | CUTANEOUS | Status: DC

## 2021-04-25 MED ORDER — CARVEDILOL 6.25 MG PO TABS
6.2500 mg | ORAL_TABLET | Freq: Two times a day (BID) | ORAL | Status: DC
  Administered 2021-04-25 – 2021-04-26 (×2): 6.25 mg via ORAL
  Filled 2021-04-25 (×2): qty 1

## 2021-04-25 MED ORDER — MAGNESIUM SULFATE 2 GM/50ML IV SOLN: 2.0000 g | Freq: Every day | INTRAVENOUS | Status: DC | PRN

## 2021-04-25 MED ORDER — PROTAMINE SULFATE 10 MG/ML IV SOLN
INTRAVENOUS | Status: DC | PRN
Start: 2021-04-25 — End: 2021-04-25
  Administered 2021-04-25: 10 mg via INTRAVENOUS
  Administered 2021-04-25 (×2): 20 mg via INTRAVENOUS

## 2021-04-25 MED ORDER — GUAIFENESIN-DM 100-10 MG/5ML PO SYRP: 15.0000 mL | ORAL_SOLUTION | ORAL | Status: DC | PRN

## 2021-04-25 MED ORDER — TRAMADOL HCL 50 MG PO TABS
50.0000 mg | ORAL_TABLET | Freq: Four times a day (QID) | ORAL | Status: DC | PRN
  Administered 2021-04-25 – 2021-04-26 (×3): 50 mg via ORAL
  Filled 2021-04-25 (×3): qty 1

## 2021-04-25 MED ORDER — ALUM & MAG HYDROXIDE-SIMETH 200-200-20 MG/5ML PO SUSP: 15.0000 mL | ORAL | Status: DC | PRN

## 2021-04-25 MED ORDER — OXYCODONE HCL 5 MG PO TABS: 5.0000 mg | ORAL_TABLET | Freq: Once | ORAL | Status: DC | PRN

## 2021-04-25 MED ORDER — AMLODIPINE BESYLATE 10 MG PO TABS
10.0000 mg | ORAL_TABLET | Freq: Every day | ORAL | Status: DC
  Administered 2021-04-26: 10 mg via ORAL
  Filled 2021-04-25: qty 1

## 2021-04-25 MED ORDER — POTASSIUM CHLORIDE CRYS ER 20 MEQ PO TBCR: 20.0000 meq | EXTENDED_RELEASE_TABLET | Freq: Every day | ORAL | Status: DC | PRN

## 2021-04-25 MED ORDER — CHLORHEXIDINE GLUCONATE 0.12 % MT SOLN
15.0000 mL | Freq: Once | OROMUCOSAL | Status: AC
  Administered 2021-04-25: 15 mL via OROMUCOSAL
  Filled 2021-04-25: qty 15

## 2021-04-25 MED ORDER — ATORVASTATIN CALCIUM 40 MG PO TABS
40.0000 mg | ORAL_TABLET | Freq: Every day | ORAL | Status: DC
  Administered 2021-04-26: 40 mg via ORAL
  Filled 2021-04-25: qty 1

## 2021-04-25 MED ORDER — MORPHINE SULFATE (PF) 2 MG/ML IV SOLN
2.0000 mg | INTRAVENOUS | Status: DC | PRN
  Administered 2021-04-25 (×2): 2 mg via INTRAVENOUS
  Administered 2021-04-25: 4 mg via INTRAVENOUS
  Filled 2021-04-25 (×2): qty 2
  Filled 2021-04-25: qty 1

## 2021-04-25 MED ORDER — ORAL CARE MOUTH RINSE: 15.0000 mL | Freq: Once | OROMUCOSAL | Status: AC

## 2021-04-25 MED ORDER — FENTANYL CITRATE (PF) 100 MCG/2ML IJ SOLN
25.0000 ug | INTRAMUSCULAR | Status: DC | PRN
  Administered 2021-04-25 (×2): 25 ug via INTRAVENOUS

## 2021-04-25 SURGICAL SUPPLY — 56 items
BAG COUNTER SPONGE SURGICOUNT (BAG) ×2 IMPLANT
CANISTER SUCT 3000ML PPV (MISCELLANEOUS) ×2 IMPLANT
CATH ROBINSON RED A/P 18FR (CATHETERS) ×2 IMPLANT
CLIP VESOCCLUDE MED 24/CT (CLIP) ×2 IMPLANT
CLIP VESOCCLUDE SM WIDE 24/CT (CLIP) ×2 IMPLANT
COVER PROBE W GEL 5X96 (DRAPES) IMPLANT
COVER TRANSDUCER ULTRASND GEL (DISPOSABLE) ×2 IMPLANT
DERMABOND ADVANCED (GAUZE/BANDAGES/DRESSINGS) ×1
DERMABOND ADVANCED .7 DNX12 (GAUZE/BANDAGES/DRESSINGS) ×1 IMPLANT
DRAIN CHANNEL 15F RND FF W/TCR (WOUND CARE) IMPLANT
ELECT REM PT RETURN 9FT ADLT (ELECTROSURGICAL) ×2
ELECTRODE REM PT RTRN 9FT ADLT (ELECTROSURGICAL) ×1 IMPLANT
EVACUATOR SILICONE 100CC (DRAIN) IMPLANT
GAUZE 4X4 16PLY ~~LOC~~+RFID DBL (SPONGE) ×1 IMPLANT
GLOVE SRG 8 PF TXTR STRL LF DI (GLOVE) ×1 IMPLANT
GLOVE SURG ENC MOIS LTX SZ7.5 (GLOVE) ×2 IMPLANT
GLOVE SURG UNDER POLY LF SZ8 (GLOVE) ×2
GOWN STRL REUS W/ TWL LRG LVL3 (GOWN DISPOSABLE) ×2 IMPLANT
GOWN STRL REUS W/ TWL XL LVL3 (GOWN DISPOSABLE) ×2 IMPLANT
GOWN STRL REUS W/TWL LRG LVL3 (GOWN DISPOSABLE) ×4
GOWN STRL REUS W/TWL XL LVL3 (GOWN DISPOSABLE) ×4
HEMOSTAT SNOW SURGICEL 2X4 (HEMOSTASIS) ×1 IMPLANT
IV ADAPTER SYR DOUBLE MALE LL (MISCELLANEOUS) IMPLANT
KIT BASIN OR (CUSTOM PROCEDURE TRAY) ×2 IMPLANT
KIT SHUNT ARGYLE CAROTID ART 6 (VASCULAR PRODUCTS) ×1 IMPLANT
KIT TURNOVER KIT B (KITS) ×2 IMPLANT
LOOP VESSEL MINI RED (MISCELLANEOUS) IMPLANT
NDL HYPO 25GX1X1/2 BEV (NEEDLE) IMPLANT
NDL SPNL 20GX3.5 QUINCKE YW (NEEDLE) IMPLANT
NEEDLE HYPO 25GX1X1/2 BEV (NEEDLE) IMPLANT
NEEDLE SPNL 20GX3.5 QUINCKE YW (NEEDLE) IMPLANT
NS IRRIG 1000ML POUR BTL (IV SOLUTION) ×6 IMPLANT
PACK CAROTID (CUSTOM PROCEDURE TRAY) ×2 IMPLANT
PAD ARMBOARD 7.5X6 YLW CONV (MISCELLANEOUS) ×4 IMPLANT
PATCH VASC XENOSURE 1CMX6CM (Vascular Products) ×2 IMPLANT
PATCH VASC XENOSURE 1X6 (Vascular Products) IMPLANT
PENCIL BUTTON HOLSTER BLD 10FT (ELECTRODE) ×1 IMPLANT
POSITIONER HEAD DONUT 9IN (MISCELLANEOUS) ×2 IMPLANT
SHUNT CAROTID BYPASS 10 (VASCULAR PRODUCTS) IMPLANT
SHUNT CAROTID BYPASS 12FRX15.5 (VASCULAR PRODUCTS) IMPLANT
SPONGE SURGIFOAM ABS GEL 100 (HEMOSTASIS) IMPLANT
SPONGE T-LAP 18X18 ~~LOC~~+RFID (SPONGE) ×1 IMPLANT
STOPCOCK 4 WAY LG BORE MALE ST (IV SETS) IMPLANT
SUT ETHILON 3 0 PS 1 (SUTURE) IMPLANT
SUT MNCRL AB 4-0 PS2 18 (SUTURE) ×2 IMPLANT
SUT PROLENE 5 0 C 1 24 (SUTURE) ×2 IMPLANT
SUT PROLENE 6 0 BV (SUTURE) ×7 IMPLANT
SUT SILK 3 0 (SUTURE)
SUT SILK 3 0 SH CR/8 (SUTURE) ×1 IMPLANT
SUT SILK 3-0 18XBRD TIE 12 (SUTURE) IMPLANT
SUT VIC AB 3-0 SH 27 (SUTURE) ×2
SUT VIC AB 3-0 SH 27X BRD (SUTURE) ×1 IMPLANT
SYR CONTROL 10ML LL (SYRINGE) IMPLANT
TOWEL GREEN STERILE (TOWEL DISPOSABLE) ×2 IMPLANT
TUBING ART PRESS 48 MALE/FEM (TUBING) IMPLANT
WATER STERILE IRR 1000ML POUR (IV SOLUTION) ×2 IMPLANT

## 2021-04-25 NOTE — Progress Notes (Signed)
Pt arrived from PACU, NIH 0, incision clean dry and intact, oriented to unit, CCMD notified, call light within reach.   Chrisandra Carota, RN 04/25/2021 2:53 PM

## 2021-04-25 NOTE — Anesthesia Procedure Notes (Signed)
Procedure Name: Intubation Date/Time: 04/25/2021 10:40 AM Performed by: Lavell Luster, CRNA Pre-anesthesia Checklist: Patient identified, Emergency Drugs available, Suction available and Patient being monitored Patient Re-evaluated:Patient Re-evaluated prior to induction Oxygen Delivery Method: Circle System Utilized Preoxygenation: Pre-oxygenation with 100% oxygen Induction Type: IV induction Ventilation: Mask ventilation without difficulty Laryngoscope Size: Mac and 4 Grade View: Grade I Tube type: Oral Tube size: 7.5 mm Number of attempts: 1 Airway Equipment and Method: Stylet and Oral airway Placement Confirmation: ETT inserted through vocal cords under direct vision, positive ETCO2 and breath sounds checked- equal and bilateral Secured at: 23 cm Tube secured with: Tape Dental Injury: Teeth and Oropharynx as per pre-operative assessment

## 2021-04-25 NOTE — Op Note (Addendum)
OPERATIVE NOTE  PROCEDURE:   1.  left carotid endarterectomy with bovine patch angioplasty 2.  left intraoperative carotid ultrasound  PRE-OPERATIVE DIAGNOSIS: left symptomatic high grade carotid stenosis >80%  POST-OPERATIVE DIAGNOSIS: same as above   SURGEON: Marty Heck, MD  ASSISTANT(S): Paulo Fruit, PA  ANESTHESIA: general  ESTIMATED BLOOD LOSS: <75 mL  FINDING(S): Endarterectomy was performed on the left for a high-grade calcified plaque that extended into the mid ICA.  A 10 French Argyle shunt was used during the case.  After bovine patch angioplasty was performed, intraoperative ultrasound showed a small flap in the proximal internal carotid artery from the endarterectomy plane.  I reopened a segmental area of the patch and this flap was removed under direct visualization.  The patch was reclosed and he had excellent Doppler flow in the internal and external carotid arteries with no evidence of flap on final ultrasound.  SPECIMEN(S):  Carotid plaque (sent to Pathology)  INDICATIONS:   Aaron Moss is a 76 y.o. male who presents with left symptomatic high grade carotid stenosis >80%.  I discussed with the patient the risks, benefits, and alternatives to carotid endarterectomy.  I discussed the procedural details of carotid endarterectomy with the patient.  The patient is aware that the risks of carotid endarterectomy include but are not limited to: bleeding, infection, stroke, myocardial infarction, death, cranial nerve injuries both temporary and permanent, neck hematoma, possible airway compromise, labile blood pressure post-operatively, cerebral hyperperfusion syndrome, and possible need for additional interventions in the future. The patient is aware of the risks and agrees to proceed forward with the procedure.  An assistant was needed for exposure and to expedite the case this included sewing the patch and performing the endarterectomy.  DESCRIPTION: After  full informed written consent was obtained from the patient, the patient was brought back to the operating room and placed supine upon the operating table.  Prior to induction, the patient received IV antibiotics.  After obtaining adequate anesthesia, the patient was placed into semi-Fowler position with a shoulder roll in place and the patient's neck slightly hyperextended and rotated away from the surgical site.  The patient was prepped in the standard fashion for a left carotid endarterectomy.  I made an incision anterior to the sternocleidomastoid muscle and dissected down through the subcutaneous tissue.  The platysma was opened with electrocautery.  I then used Bovie cautery and blunt dissection to dissect through the underlying platysma and to mobilize the anterior border of the sternocleidomastoid as well as the internal jugular vein laterally.  The facial vein was ligated with 3-0 silk and divided.  After identifying the carotid artery I used Metzenbaum scissors to bluntly dissect the common carotid artery and then controlled this with a umbilical tape.  At this point in time the patient was given 100 units/kg of IV heparin and we checked an ACT to ensure it was greater than 250.  I then carried my dissection cephalad and mobilized the external carotid artery and superior thyroid artery and controlled each of these with a vessel loop.  I then dissected out the internal carotid artery well past the distal plaque.  The internal carotid artery was then controlled with a umbilical tape as well. I was careful to identify the vagus nerve between the internal jugular and common carotid and this was presereved.  I was also careful to identify and preserve the hypoglossal nerve and this was preserved.    Once our ACT was confirmed, I proceeded  by clamping the internal carotid artery with a angled bulldog clamp first.  The proximal common carotid artery was controlled with a angled debakey clamp.   The external  carotid was controlled with a vessel loop.  I subsequently opened the common carotid artery with an 11 blade scalpel in longitudinal fashion and extended the arteriotomy with Potts scissors onto the ICA past the distal plaque.   I then used a Garment/textile technologist and performed a endarterectomy starting in the common carotid artery.  The external carotid artery was endarterectomized with an eversion technique and I was careful to feather the distal ICA plaque using my assistant.  The specimen was passed off the field.  The endarterectomy site was then flushed with heparinized saline and I was careful to ensure there were no flaps in the endarterectomy site. At this point a 10 [place] Argyle shunt was brought to the field and then initially placed distally into the ICA after removing the clamp. The shunt was back bleed with good flow.  I then placed the proximal end of the shunt in the common carotid artery and controlled this with a Rummel tourniquet.   I then brought a bovine carotid patch on the field and this was sewn in place with a running anastomosis using a 6-0 Prolene distally and a 5-0 proximal using my assistant.  The bovine patch was trimmed accordingly.  The shunt was removed just before completion of the patch.  The artery was flushed antegrade and retrograde prior to completion of the patch.  Once the patch was complete, I flushed up the external carotid artery first prior to releasing the internal carotid artery clamp.  An intraoperative duplex was performed and there was a small flap in the proximal internal carotid artery from the endarterectomy plane that appeared mobile.  I subsequently reclamped the internal carotid artery with an angled bulldog clamp and clamped the proximal common carotid with an angled DeBakey clamp and Vesseloops on the external carotid artery.  I reopened the lateral side of the patch focally where I could visualize the flap on ultrasound.  I then removed this small flap under  direct visualization and this was from the previous endarterectomy.  I then flushed out the endarterectomy site again with heparinized saline and backbled all the vessels.  I then reclosed the lateral portion of the patch with a 6-0 Prolene in running fashion.  Another intraoperative ultrasound showed no remnant flap.  There was excellent Doppler flow in the internal and external carotid artery.  Once I was happy with the intraoperative ultrasound the patient was given protamine for reversal.  I used surgicel snow to get hemostasis around the patch.  Ultimately the platysma was closed in running fashion with 3-0 Vicryl.  The skin was closed with a running 4-0 Monocryl.  Dermabond was applied with a dry sterile dressing.  The patient was awakened from anesthesia with no new neurological deficit and taken to PACU in stable condition.    COMPLICATIONS: None  CONDITION: Stable  Marty Heck, MD [facility] Office: [place]   04/25/2021, 1:16 PM

## 2021-04-25 NOTE — Transfer of Care (Signed)
Immediate Anesthesia Transfer of Care Note  Patient: Aaron Moss  Procedure(s) Performed: LEFT ENDARTERECTOMY CAROTID (Left: Neck) PATCH ANGIOPLASTY XENOSURE 1CM X 6CM (Left: Neck)  Patient Location: PACU  Anesthesia Type:General  Level of Consciousness: awake, alert  and oriented  Airway & Oxygen Therapy: Patient Spontanous Breathing  Post-op Assessment: Post -op Vital signs reviewed and stable  Post vital signs: stable  Last Vitals:  Vitals Value Taken Time  BP 124/60 04/25/21 1336  Temp    Pulse 63 04/25/21 1341  Resp 18 04/25/21 1341  SpO2 96 % 04/25/21 1341  Vitals shown include unvalidated device data.  Last Pain:  Vitals:   04/25/21 0749  TempSrc:   PainSc: 0-No pain         Complications: No notable events documented.

## 2021-04-25 NOTE — Discharge Instructions (Signed)
   [facility]  Discharge Instructions   Carotid Surgery  Please refer to the following instructions for your post-procedure care. Your surgeon or physician assistant will discuss any changes with you.  Activity  You are encouraged to walk as much as you can. You can slowly return to normal activities but must avoid strenuous activity and heavy lifting until your doctor tell you it's okay. Avoid activities such as vacuuming or swinging a golf club. You can drive after one week if you are comfortable and you are no longer taking prescription pain medications. It is normal to feel tired for serval weeks after your surgery. It is also normal to have difficulty with sleep habits, eating, and bowel movements after surgery. These will go away with time.  Bathing/Showering  Shower daily after you go home. Do not soak in a bathtub, hot tub, or swim until the incision heals completely.  Incision Care  Shower every day. Clean your incision with mild soap and water. Pat the area dry with a clean towel. You do not need a bandage unless otherwise instructed. Do not apply any ointments or creams to your incision. You may have skin glue on your incision. Do not peel it off. It will come off on its own in about one week. Your incision may feel thickened and raised for several weeks after your surgery. This is normal and the skin will soften over time.   For Men Only: It's okay to shave around the incision but do not shave the incision itself for 2 weeks. It is common to have numbness under your chin that could last for several months.  Diet  Resume your normal diet. There are no special food restrictions following this procedure. A low fat/low cholesterol diet is recommended for all patients with vascular disease. In order to heal from your surgery, it is CRITICAL to get adequate nutrition. Your body requires vitamins, minerals, and protein. Vegetables are the best source of  vitamins and minerals. Vegetables also provide the perfect balance of protein. Processed food has little nutritional value, so try to avoid this.  Medications  Resume taking all of your medications unless your doctor or physician assistant tells you not to. If your incision is causing pain, you may take over-the- counter pain relievers such as acetaminophen (Tylenol). If you were prescribed a stronger pain medication, please be aware these medications can cause nausea and constipation. Prevent nausea by taking the medication with a snack or meal. Avoid constipation by drinking plenty of fluids and eating foods with a high amount of fiber, such as fruits, vegetables, and grains.   Do not take Tylenol if you are taking prescription pain medications.  Follow Up  Our office will schedule a follow up appointment 2-3 weeks following discharge.  Please call us immediately for any of the following conditions  . Increased pain, redness, drainage (pus) from your incision site. . Fever of 101 degrees or higher. . If you should develop stroke (slurred speech, difficulty swallowing, weakness on one side of your body, loss of vision) you should call 911 and go to the nearest emergency room. .  Reduce your risk of vascular disease:  . Stop smoking. If you would like help call [facility] at 1-800-QUIT-NOW (1-800-784-8669) or [facility] at [phone]. . Manage your cholesterol . Maintain a desired weight . Control your diabetes . Keep your blood pressure down .  If you have any questions, please call the office at [phone]. 

## 2021-04-25 NOTE — Plan of Care (Signed)
  Problem: Health Behavior/Discharge Planning: Goal: Ability to manage health-related needs will improve Outcome: Progressing   Problem: Clinical Measurements: Goal: Will remain free from infection Outcome: Progressing Goal: Diagnostic test results will improve Outcome: Progressing   

## 2021-04-25 NOTE — H&P (Signed)
History and Physical Interval Note:  04/25/2021 9:57 AM  Aaron Moss  has presented today for surgery, with the diagnosis of LEFT CAROTID STENOSIS.  The various methods of treatment have been discussed with the patient and family. After consideration of risks, benefits and other options for treatment, the patient has consented to  Procedure(s): LEFT ENDARTERECTOMY CAROTID (Left) as a surgical intervention.  The patient's history has been reviewed, patient examined, no change in status, stable for surgery.  I have reviewed the patient's chart and labs.  Questions were answered to the patient's satisfaction.    Plan left carotid endarterectomy for high grade symptomatic stenosis.  Risks and benefits again discussed including 1% risk perioperative stroke, cranial nerve injury, hematoma, risk of anesthesia, infection, etc.  Wilhemena Durie Consult       Reason for Consult:  Symptomatic left carotid stenosis Referring Physician:  Hospitalist MRN #:  [id number]   History of Present Illness: This is a 76 y.o. male with history of hypertension, hyperlipidemia, prostate cancer, colon cancer, and recently diagnosed duodenal adenocarcinoma that vascular surgery has been consulted for a high-grade calcified left internal carotid stenosis.  Patient had a fall at home earlier today and then had some dysphagia.  When EMS arrived he had some right upper extremity weakness and a code stroke was called.  Patient states he had another event around Christmas day where he had some tingling in his right hand that resolved.  He has had no previous history of neck surgery or neck radiation.  States he is scheduled undergo surgery with Dr. Barry Dienes for his duodenal adenocarcinoma but this has not been scheduled.  He was seen by cardiology last week and scheduled for carotid Dopplers.  He was previously on aspirin but this was stopped recently due to anemia.  He is now back to his baseline and reports no  neurologic deficits.  He reports he has lost 30 pounds.  Albumin is 3.0.  He has excellent functional status at baseline and was exercising 3 times a week.       Past Medical History:  Diagnosis Date   Colon cancer ([place]) 2000   Diverticulosis of colon     GERD (gastroesophageal reflux disease)     Glaucoma     History of chemotherapy 2000    colon cancer   History of radiation therapy 08/06/13- 10/03/13    prostate 7800 cGy 40 sessions, seminal vesicles 5600 cGy 40 sessions   Hyperlipidemia     Hypertension     Left knee DJD     Prostate cancer ([place]) 05/23/2013    gleason 3+4=7, volume 11.5 ml           Past Surgical History:  Procedure Laterality Date   COLON SURGERY   2000    colon cancer   COLONOSCOPY       EUS        pancreatic cyst   KNEE ARTHROSCOPY   2007    LT- [facility]   PROSTATE BIOPSY   05/23/13    gleason 7, volume 11.5 ml           Allergies  Allergen Reactions   Lyrica [Pregabalin] Other (See Comments)      "Just made me feel bad"             Prior to Admission medications   Medication Sig Start Date End Date Taking? Authorizing Provider  amLODipine (NORVASC) 10 MG tablet TAKE 1 TABLET BY MOUTH ONCE  DAILY 07/07/20   Yes Biagio Borg, MD  atorvastatin (LIPITOR) 40 MG tablet Take 1 tablet (40 mg total) by mouth daily. 07/07/20   Yes Biagio Borg, MD  benazepril (LOTENSIN) 40 MG tablet Take 1 tablet (40 mg total) by mouth daily. 07/07/20   Yes Biagio Borg, MD  carvedilol (COREG) 12.5 MG tablet Take 1 tablet (12.5 mg total) by mouth 2 (two) times daily. 04/13/21 07/12/21 Yes Lelon Perla, MD  hydrochlorothiazide (HYDRODIURIL) 25 MG tablet TAKE 1 TABLET BY MOUTH ONCE DAILY. Patient taking differently: Take 25 mg by mouth daily. 07/07/20   Yes Biagio Borg, MD  pantoprazole (PROTONIX) 40 MG tablet Take 1 tablet (40 mg total) by mouth daily. 02/16/21   Yes Biagio Borg, MD  potassium chloride (KLOR-CON) 10 MEQ tablet Take 3 tablets (30 mEq total) by mouth  daily. 07/07/20   Yes Biagio Borg, MD  traMADol (ULTRAM) 50 MG tablet TAKE 1 TABLET BY MOUTH EVERY 6 HOURS AS NEEDED FOR PAIN Patient taking differently: Take 50 mg by mouth every 6 (six) hours as needed (pain). 11/12/20   Yes Biagio Borg, MD  Travoprost, BAK Free, (TRAVATAN) 0.004 % SOLN ophthalmic solution Place 1 drop into both eyes at bedtime.     Yes [provider]  VITAMIN D PO Take 1 tablet by mouth daily.     Yes [provider]  iron polysaccharides (NU-IRON) 150 MG capsule Take 1 capsule (150 mg total) by mouth 2 (two) times daily. Patient not taking: Reported on 04/18/2021 02/17/21     Biagio Borg, MD      Social History         Socioeconomic History   Marital status: Married      Spouse name: Not on file   Number of children: 4   Years of education: Not on file   Highest education level: Not on file  Occupational History   Not on file  Tobacco Use   Smoking status: Former      Packs/day: 1.00      Years: 22.00      Pack years: 22.00      Types: Cigarettes      Quit date: 03/20/1988      Years since quitting: 33.1   Smokeless tobacco: Never  Vaping Use   Vaping Use: Never used  Substance and Sexual Activity   Alcohol use: Not Currently      Comment: rare   Drug use: No   Sexual activity: Not Currently  Other Topics Concern   Not on file  Social History Narrative   Not on file    Social Determinants of Health       Financial Resource Strain: Low Risk    Difficulty of Paying Living Expenses: Not hard at all  Food Insecurity: No Food Insecurity   Worried About Charity fundraiser in the Last Year: Never true   [place] in the Last Year: Never true  Transportation Needs: No Transportation Needs   Lack of Transportation (Medical): No   Lack of Transportation (Non-Medical): No  Physical Activity: Sufficiently Active   Days of Exercise per Week: 3 days   Minutes of Exercise per Session: 60 min  Stress: No Stress Concern Present    Feeling of Stress : Not at all  Social Connections: Socially Integrated   Frequency of Communication with Friends and Family: More than three times a week   Frequency of Social  Gatherings with Friends and Family: More than three times a week   Attends Religious Services: 1 to 4 times per year   Active Member of [employer] or Organizations: Yes   Attends Archivist Meetings: 1 to 4 times per year   Marital Status: Married  Human resources officer Violence: Not on file             Family History  Problem Relation Age of Onset   Cancer Mother          Liver? bladder?, dx. late 54s   Colon cancer Sister 58   Cancer Brother          Lung   Diabetes Brother     Cancer Maternal Aunt          unknown type   Breast cancer Maternal Grandmother          dx. >50   Cancer Daughter 59        colon   Esophageal cancer Neg Hx     Rectal cancer Neg Hx     Stomach cancer Neg Hx        ROS: [x]  Positive   [ ]  Negative   [ ]  All sytems reviewed and are negative   Cardiovascular: []  chest pain/pressure []  palpitations []  SOB lying flat []  DOE []  pain in legs while walking []  pain in legs at rest []  pain in legs at night []  non-healing ulcers []  hx of DVT []  swelling in legs   Pulmonary: []  productive cough []  asthma/wheezing []  home O2   Neurologic: []  weakness in []  arms []  legs []  numbness in []  arms []  legs []  hx of CVA []  mini stroke [] difficulty speaking or slurred speech []  temporary loss of vision in one eye []  dizziness   Hematologic: []  hx of cancer []  bleeding problems []  problems with blood clotting easily   Endocrine:   []  diabetes []  thyroid disease   GI []  vomiting blood []  blood in stool   GU: []  CKD/renal failure []  HD--[]  M/W/F or []  T/T/S []  burning with urination []  blood in urine   Psychiatric: []  anxiety []  depression   Musculoskeletal: []  arthritis []  joint pain   Integumentary: []  rashes []  ulcers   Constitutional: []  fever []   chills     Physical Examination       Vitals:    04/18/21 1615 04/18/21 1630  BP: (!) 145/54 (!) 129/46  Pulse: 62 65  Resp: 16 12  Temp:      SpO2: 99% 100%    Body mass index is 35.73 kg/m.   General:  NAD HENT: WNL, normocephalic Pulmonary: normal non-labored breathing Cardiac: regular, without  Murmurs, rubs or gallops Abdomen:  soft, NT/ND Vascular Exam/Pulses: No previous neck incisions 2+ radial pulses bilateral upper extremities Extremities: without ischemic changes Musculoskeletal: no muscle wasting or atrophy         Neurologic: A&O X 3; Appropriate Affect ; SENSATION: normal; MOTOR FUNCTION:  moving all extremities equally. Speech is fluent/normal.  Cranial nerves II through XII grossly intact.     CBC Labs (Brief)          Component Value Date/Time    WBC 8.7 04/18/2021 1315    RBC 4.52 04/18/2021 1315    HGB 11.9 (L) 04/18/2021 1321    HGB 10.0 (L) 04/07/2021 1514    HCT 35.0 (L) 04/18/2021 1321    PLT 263 04/18/2021 1315    PLT 313 04/07/2021 1514  MCV 75.9 (L) 04/18/2021 1315    MCH 23.0 (L) 04/18/2021 1315    MCHC 30.3 04/18/2021 1315    RDW 18.5 (H) 04/18/2021 1315    LYMPHSABS 1.6 04/18/2021 1315    MONOABS 0.9 04/18/2021 1315    EOSABS 0.1 04/18/2021 1315    BASOSABS 0.0 04/18/2021 1315        BMET Labs (Brief)          Component Value Date/Time    NA 139 04/18/2021 1321    K 3.3 (L) 04/18/2021 1321    CL 103 04/18/2021 1321    CO2 26 04/18/2021 1315    GLUCOSE 105 (H) 04/18/2021 1321    BUN 16 04/18/2021 1321    CREATININE 1.20 04/18/2021 1321    CREATININE 1.18 04/07/2021 1514    CALCIUM 8.6 (L) 04/18/2021 1315    GFRNONAA 60 (L) 04/18/2021 1315    GFRNONAA >60 04/07/2021 1514    GFRAA >60 02/24/2019 0207        COAGS: Recent Labs       Lab Results  Component Value Date    INR 1.1 04/18/2021          Non-Invasive Vascular Imaging:     CTA neck reviewed and agree he has a high-grade calcified left internal  carotid artery stenosis greater than 90%     ASSESSMENT/PLAN: This is a 76 y.o. male  with history of hypertension, hyperlipidemia, prostate cancer, colon cancer, and recently diagnosed duodenal adenocarcinoma that vascular surgery has been consulted for a high-grade calcified left internal carotid stenosis >90% with TIA.  I have recommended left carotid revascularization after review of his CTA and agree this is likely the etiology for his slurred speech and right upper extremity weakness.  I worry about his tolerance for dual antiplatelet therapy with duodenal adenocarcinoma and his need for surgical intervention on his duodenal cancer and in addition the lesion is very calcified that could making stenting difficult.  I have recommended left carotid endarterectomy.  I will put him on the schedule for Monday 04/25/21 with me.  He can stay on dual antiplatelet therapy perioperative from my standpoint.  I discussed risk and benefits of surgery including risk of 1% perioperative stroke, bleeding, infection, hematoma, risk of anesthesia etc. Will allow him to complete his MRI, echocardiogram, and further stroke work-up.  Vascular will follow.   Marty Heck, MD [facility] Office: [place]

## 2021-04-25 NOTE — Anesthesia Procedure Notes (Signed)
Arterial Line Insertion Start/End2/08/2021 8:15 AM, 04/25/2021 8:25 AM Performed by: Wilburn Cornelia, CRNA, CRNA  Patient location: Pre-op. Preanesthetic checklist: patient identified, IV checked, site marked, risks and benefits discussed, surgical consent, monitors and equipment checked, pre-op evaluation, timeout performed and anesthesia consent Lidocaine 1% used for infiltration Left, radial was placed Catheter size: 20 G Hand hygiene performed  and maximum sterile barriers used  Allen's test indicative of satisfactory collateral circulation Attempts: 3 Procedure performed without using ultrasound guided technique. Following insertion, dressing applied and Biopatch. Post procedure assessment: normal  Patient tolerated the procedure well with no immediate complications.

## 2021-04-25 NOTE — Anesthesia Preprocedure Evaluation (Addendum)
Anesthesia Evaluation  Patient identified by MRN, date of birth, ID band Patient awake    Reviewed: Allergy & Precautions, NPO status , Patient's Chart, lab work & pertinent test results  Airway Mallampati: II  TM Distance: >3 FB Neck ROM: Full    Dental  (+) Poor Dentition,    Pulmonary former smoker,    Pulmonary exam normal breath sounds clear to auscultation       Cardiovascular Exercise Tolerance: Good hypertension, + CAD and + Peripheral Vascular Disease (left carotid stenosis; on plavix)  Normal cardiovascular exam Rhythm:Regular Rate:Normal     Neuro/Psych TIAnegative psych ROS   GI/Hepatic Neg liver ROS, GERD  ,  Endo/Other  hyperlipidemia  Renal/GU   negative genitourinary   Musculoskeletal  (+) Arthritis , Osteoarthritis,    Abdominal   Peds negative pediatric ROS (+)  Hematology  (+) Blood dyscrasia, anemia ,   Anesthesia Other Findings   Reproductive/Obstetrics negative OB ROS                          Anesthesia Physical Anesthesia Plan  ASA: 3  Anesthesia Plan: General   Post-op Pain Management: Tylenol PO (pre-op)   Induction: Intravenous  PONV Risk Score and Plan: 2 and Treatment may vary due to age or medical condition, Ondansetron and Dexamethasone  Airway Management Planned: Oral ETT  Additional Equipment: None and Arterial line  Intra-op Plan:   Post-operative Plan: Extubation in OR  Informed Consent: I have reviewed the patients History and Physical, chart, labs and discussed the procedure including the risks, benefits and alternatives for the proposed anesthesia with the patient or authorized representative who has indicated his/her understanding and acceptance.     Dental advisory given  Plan Discussed with: Anesthesiologist and CRNA  Anesthesia Plan Comments:        Anesthesia Quick Evaluation

## 2021-04-26 ENCOUNTER — Encounter (HOSPITAL_COMMUNITY): Payer: Self-pay | Admitting: Vascular Surgery

## 2021-04-26 LAB — LIPID PANEL
Cholesterol: 77 mg/dL (ref 0–200)
HDL: 20 mg/dL — ABNORMAL LOW (ref >40)
LDL Cholesterol: 45 mg/dL (ref 0–99)
Total CHOL/HDL Ratio: 3.9 RATIO
Triglycerides: 62 mg/dL (ref <150)
VLDL: 12 mg/dL (ref 0–40)

## 2021-04-26 LAB — CBC
HCT: 22.6 % — ABNORMAL LOW (ref 39.0–52.0)
Hemoglobin: 7 g/dL — ABNORMAL LOW (ref 13.0–17.0)
MCH: 23 pg — ABNORMAL LOW (ref 26.0–34.0)
MCHC: 31 g/dL (ref 30.0–36.0)
MCV: 74.1 fL — ABNORMAL LOW (ref 80.0–100.0)
Platelets: 231 10*3/uL (ref 150–400)
RBC: 3.05 MIL/uL — ABNORMAL LOW (ref 4.22–5.81)
RDW: 18.3 % — ABNORMAL HIGH (ref 11.5–15.5)
WBC: 9.6 10*3/uL (ref 4.0–10.5)
nRBC: 0 % (ref 0.0–0.2)

## 2021-04-26 LAB — BASIC METABOLIC PANEL
Anion gap: 7 (ref 5–15)
BUN: 10 mg/dL (ref 8–23)
CO2: 23 mmol/L (ref 22–32)
Calcium: 7.9 mg/dL — ABNORMAL LOW (ref 8.9–10.3)
Chloride: 111 mmol/L (ref 98–111)
Creatinine, Ser: 1.14 mg/dL (ref 0.61–1.24)
GFR, Estimated: >60 mL/min (ref >60)
Glucose, Bld: 99 mg/dL (ref 70–99)
Potassium: 3.3 mmol/L — ABNORMAL LOW (ref 3.5–5.1)
Sodium: 141 mmol/L (ref 135–145)

## 2021-04-26 LAB — PREPARE RBC (CROSSMATCH)

## 2021-04-26 LAB — POCT ACTIVATED CLOTTING TIME
Activated Clotting Time: 293 seconds
Activated Clotting Time: 215 seconds

## 2021-04-26 MED ORDER — POTASSIUM CHLORIDE CRYS ER 20 MEQ PO TBCR
40.0000 meq | EXTENDED_RELEASE_TABLET | Freq: Once | ORAL | Status: AC
  Administered 2021-04-26: 40 meq via ORAL
  Filled 2021-04-26: qty 2

## 2021-04-26 NOTE — Progress Notes (Signed)
PHARMACIST LIPID MONITORING   Aaron Moss is a 76 y.o. male admitted on 04/25/2021 s/p CEA.  Pharmacy has been consulted to optimize lipid-lowering therapy with the indication of secondary prevention for clinical ASCVD.  Recent Labs:  Lipid Panel (last 6 months):   Lab Results  Component Value Date   CHOL 77 04/26/2021   TRIG 62 04/26/2021   HDL 20 (L) 04/26/2021   CHOLHDL 3.9 04/26/2021   VLDL 12 04/26/2021   LDLCALC 45 04/26/2021    Hepatic function panel (last 6 months):   Lab Results  Component Value Date   AST 19 04/22/2021   ALT 17 04/22/2021   ALKPHOS 95 04/22/2021   BILITOT 0.9 04/22/2021   BILIDIR 0.2 02/16/2021    SCr (since admission):   Serum creatinine: 1.14 mg/dL 04/26/21 0420 Estimated creatinine clearance: 60.3 mL/min  Current therapy and lipid therapy tolerance Current lipid-lowering therapy: atorvastatin 40mg /d Documented or reported allergies or intolerances to lipid-lowering therapies (if applicable): none   Plan:    1.Statin intensity (high intensity recommended for all patients regardless of the LDL):  No statin changes. The patient is already on a high intensity statin.  2.Add ezetimibe (if any one of the following):   Not indicated at this time.  3.Refer to lipid clinic:   No  4.Follow-up with:  Primary care provider - Biagio Borg, MD  5.Follow-up labs after discharge:  No changes in lipid therapy, repeat a lipid panel in one year.      Hildred Laser, PharmD Clinical Pharmacist **Pharmacist phone directory can now be found on [email or web address] ([email or web address]).  Listed under [place].

## 2021-04-26 NOTE — Progress Notes (Signed)
Order received to discharge patient.  Telemetry monitor removed and CCMD notified.  PIV access removed.  Discharge instructions, follow up, medications and instructions for their use discussed with patient. 

## 2021-04-26 NOTE — Progress Notes (Addendum)
°  Progress Note    04/26/2021 7:40 AM 1 Day Post-Op  Subjective:  No slurring speech or R sided weakness since prior to surgery   Vitals:   04/26/21 0100 04/26/21 0400  BP: (!) 126/56 (!) 143/57  Pulse: 68 70  Resp: 19 15  Temp:  98.7 F (37.1 C)  SpO2: 94% 99%   Physical Exam: Lungs:  non labored Incisions:  L neck incision c/d/I with some local edema but no firm hematoma Neurologic: CN grossly intact  CBC    Component Value Date/Time   WBC 9.6 04/26/2021 0420   RBC 3.05 (L) 04/26/2021 0420   HGB 7.0 (L) 04/26/2021 0420   HGB 10.0 (L) 04/07/2021 1514   HCT 22.6 (L) 04/26/2021 0420   PLT 231 04/26/2021 0420   PLT 313 04/07/2021 1514   MCV 74.1 (L) 04/26/2021 0420   MCH 23.0 (L) 04/26/2021 0420   MCHC 31.0 04/26/2021 0420   RDW 18.3 (H) 04/26/2021 0420   LYMPHSABS 1.6 04/18/2021 1315   MONOABS 0.9 04/18/2021 1315   EOSABS 0.1 04/18/2021 1315   BASOSABS 0.0 04/18/2021 1315    BMET    Component Value Date/Time   NA 141 04/26/2021 0420   K 3.3 (L) 04/26/2021 0420   CL 111 04/26/2021 0420   CO2 23 04/26/2021 0420   GLUCOSE 99 04/26/2021 0420   BUN 10 04/26/2021 0420   CREATININE 1.14 04/26/2021 0420   CREATININE 1.18 04/07/2021 1514   CALCIUM 7.9 (L) 04/26/2021 0420   GFRNONAA >60 04/26/2021 0420   GFRNONAA >60 04/07/2021 1514   GFRAA >60 02/24/2019 0207    INR    Component Value Date/Time   INR 1.1 04/22/2021 1118     Intake/Output Summary (Last 24 hours) at 04/26/2021 0740 Last data filed at 04/26/2021 0402 Gross per 24 hour  Intake 2864.92 ml  Output 700 ml  Net 2164.92 ml     Assessment/Plan:  76 y.o. male is s/p L CEA for symptomatic stenosis 1 Day Post-Op   Neuro exam remains stable compared to pre-op L neck with local edema but no firm hematoma Hgb 7 this morning; will transfuse 1u pRBCs given that he will need DAPT for 2 more weeks based on Neurology recommendation Discharge home later this afternoon after transfusion   Dagoberto Ligas, PA-C [facility] [phone] 04/26/2021 7:40 AM  I have seen and evaluated the patient. I agree with the PA note as documented above.  Postop day 1 status post left carotid endarterectomy for symptomatic high-grade stenosis.  Looks good this morning and is neurologically intact.  Hemoglobin went from 7.2 preop to 7.0.  Will give 1 unit of blood prior to discharge given known duodenal adenocarcinoma.  Neurology recommended 3 weeks of aspirin plus Plavix and then aspirin alone.  Discussed plan with patient.  I will see him in about 2 to 3 weeks.  Hopefully discharge today.  He will follow-up with Dr. Barry Dienes for further surgical evaluation of malignancy.  Marty Heck, MD [facility] Office: [phone]

## 2021-04-26 NOTE — Anesthesia Postprocedure Evaluation (Signed)
Anesthesia Post Note  Patient: CONOR LATA  Procedure(s) Performed: LEFT ENDARTERECTOMY CAROTID (Left: Neck) PATCH ANGIOPLASTY XENOSURE 1CM X 6CM (Left: Neck)     Patient location during evaluation: PACU Anesthesia Type: General Level of consciousness: awake Pain management: pain level controlled Vital Signs Assessment: post-procedure vital signs reviewed and stable Respiratory status: spontaneous breathing and respiratory function stable Cardiovascular status: stable Postop Assessment: no apparent nausea or vomiting Anesthetic complications: no   No notable events documented.  Last Vitals:  Vitals:   04/26/21 0844 04/26/21 0859  BP: (!) 143/57 138/60  Pulse: 76 76  Resp: 20 16  Temp: 37.6 C 37.6 C  SpO2: 97% 97%    Last Pain:  Vitals:   04/26/21 0859  TempSrc: Oral  PainSc:                  Merlinda Frederick

## 2021-04-27 LAB — TYPE AND SCREEN
ABO/RH(D): A POS
Antibody Screen: NEGATIVE
Unit division: 0
Unit division: 0

## 2021-04-27 LAB — BPAM RBC
Blood Product Expiration Date: 202302072359
Blood Product Expiration Date: 202302142359
ISSUE DATE / TIME: 202302070838
ISSUE DATE / TIME: 202302071116
Unit Type and Rh: 6200
Unit Type and Rh: 9500

## 2021-04-27 NOTE — Discharge Summary (Signed)
Discharge Summary     Aaron Moss 1945/10/21 75 y.o. male  [id number]  Admission Date: 04/25/2021  Discharge Date: 04/26/21  Physician: Dr. 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 is a 76 year old male who was brought in as an outpatient and underwent left carotid endarterectomy on 04/25/2021 by Dr. Carlis Moss due to symptomatic high-grade stenosis.  He tolerated the procedure well and was admitted to the hospital postoperatively.  POD #1 he denied any further right-sided weakness or slurring speech, which were his presenting symptoms of TIA.  Hemoglobin was 7 compared to 7.3 preoperatively.  Based on neurology recommendations he should continue dual antiplatelet therapy for an additional 2 more weeks.  Because of this, patient was transfused 1 unit of packed red blood cells.  He tolerated the transfusion well and was discharged after completion.  He will follow-up in office in 2 to 3 weeks to make sure incision is well-healed.  Discharge instructions were reviewed with the patient and he voiced his understanding.  At the time of discharge neurological exam remained at baseline.  He was discharged home in stable condition.   Recent Labs    04/26/21 0420  NA 141  K 3.3*  CL 111  CO2 23  GLUCOSE 99  BUN 10  CALCIUM 7.9*   Recent Labs    04/25/21 0915 04/26/21 0420  WBC 9.3 9.6  HGB 7.2* 7.0*  HCT 22.9* 22.6*  PLT 230 231   No results for input(s): INR in the last 72 hours.     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   Loss of weight 03/08/2021   Iron deficiency anemia 02/16/2021   Epigastric pain 02/16/2021   Abnormal LFTs 02/16/2021   Leg swelling 11/16/2020   Anemia 11/16/2020   Hypokalemia 07/07/2020   Vitamin D deficiency 07/07/2020   Dog bite 06/26/2020   Change in bowel habits 11/01/2019   Chronic right-sided low back pain without sciatica 07/02/2019   Skin lesion 12/24/2018   Cough 05/03/2018   Hyperglycemia 05/03/2018   Left ankle sprain 11/01/2017   Left knee pain 12/05/2016   Allergic rhinitis 07/29/2015   CAP (community acquired pneumonia) 04/01/2015   Abnormal urine findings 04/01/2015   UTI (urinary tract infection) 08/21/2013   Malignant neoplasm of prostate ([place]) 07/02/2013   Bilateral knee pain 08/29/2011   Bladder neck obstruction 08/29/2011   Erectile dysfunction 06/20/2011   Encounter for well adult exam with abnormal findings 12/14/2010   POSTHERPETIC NEURALGIA 06/08/2009   HERPES ZOSTER 05/11/2009   Cyst and pseudocyst of pancreas 05/04/2009   HIP PAIN, RIGHT 07/06/2008   BACK PAIN 07/06/2008   Hyperlipidemia 05/15/2007   Hypertension, uncontrolled 10/12/2006   History of colon cancer 10/12/2006   Past Medical History:  Diagnosis Date   Carotid stenosis, left    Colon cancer ([place]) 2000   Coronary artery disease    per Dr. 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 Swallow whole.   atorvastatin 40 MG tablet Commonly known as: LIPITOR Take 1 tablet (40 mg total) by mouth daily.   benazepril 40 MG tablet Commonly known as: LOTENSIN Take 1 tablet (40 mg total) by mouth daily.   carvedilol 6.25 MG tablet Commonly known as: COREG Take 1 tablet (6.25 mg total) by mouth 2 (two) times daily.   clopidogrel 75 MG tablet Commonly known as: PLAVIX Take 1 tablet (75 mg total) by mouth daily.   iron polysaccharides 150 MG capsule Commonly known as: Nu-Iron Take 1 capsule (150 mg total) by mouth 2 (two) times daily.   pantoprazole 40 MG tablet Commonly known as: PROTONIX Take 1 tablet (40 mg total) by mouth daily.   traMADol 50 MG tablet Commonly known as: ULTRAM TAKE 1 TABLET BY MOUTH EVERY 6 HOURS AS NEEDED FOR PAIN What changed:  reasons to take this additional instructions   Travoprost (BAK Free) 0.004 % Soln ophthalmic solution Commonly known as: TRAVATAN Place 1 drop into both eyes at bedtime.   VITAMIN D PO Take 1 tablet by mouth daily.         Discharge Instructions:   [facility] Discharge Instructions Carotid Endarterectomy (CEA)  Please refer to the following instructions for your post-procedure care. Your surgeon or physician assistant will discuss any changes with you.  Activity  You are encouraged to walk as much as you can. You can slowly return to normal activities but must avoid strenuous activity and heavy lifting until your doctor tell you it's OK. Avoid activities such as vacuuming or swinging a golf club. You can drive after one week if you are comfortable and you are no longer taking prescription pain medications. It is normal to feel tired for serval weeks after your  surgery. It is also normal to have difficulty with sleep habits, eating, and bowel movements after surgery. These will go away with time.  Bathing/Showering  You may shower after you come home. Do not soak in a bathtub, hot tub, or swim until the incision heals completely.  Incision Care  Shower every day. Clean your incision with mild soap and water. Pat the area dry with a clean towel. You do not need a bandage unless otherwise instructed. Do not apply any ointments or creams to your incision. You may have skin glue on your incision. Do not peel it off. It will come off on its own in about one week. Your incision may feel thickened and raised for several weeks after your surgery. This is normal and the skin will soften over time. For Men Only: It's OK to shave around the  incision but do not shave the incision itself for 2 weeks. It is common to have numbness under your chin that could last for several months.  Diet  Resume your normal diet. There are no special food restrictions following this procedure. A low fat/low cholesterol diet is recommended for all patients with vascular disease. In order to heal from your surgery, it is CRITICAL to get adequate nutrition. Your body requires vitamins, minerals, and protein. Vegetables are the best source of vitamins and minerals. Vegetables also provide the perfect balance of protein. Processed food has little nutritional value, so try to avoid this.  Medications  Resume taking all of your medications unless your doctor or physician assistant tells you not to.  If your incision is causing pain, you may take over-the- counter pain relievers such as acetaminophen (Tylenol). If you were prescribed a stronger pain medication, please be aware these medications can cause nausea and constipation.  Prevent nausea by taking the medication with a snack or meal. Avoid constipation by drinking plenty of fluids and eating foods with a high amount of fiber, such as  fruits, vegetables, and grains. Do not take Tylenol if you are taking prescription pain medications.  Follow Up  Our office will schedule a follow up appointment 2-3 weeks following discharge.  Please call us immediately for any of the following conditions  Increased pain, redness, drainage (pus) from your incision site. Fever of 101 degrees or higher. If you should develop stroke (slurred speech, difficulty swallowing, weakness on one side of your body, loss of vision) you should call 911 and go to the nearest emergency room.  Reduce your risk of vascular disease:  Stop smoking. If you would like help call [facility] at 1-800-QUIT-NOW 7161281537) or [place] at [phone]. Manage your cholesterol Maintain a desired weight Control your diabetes Keep your blood pressure down  If you have any questions, please call the office at [phone].   Disposition: home  Patient's condition: is Good  Follow up: 1. Dr. Carlis Moss in 2 weeks.   Dagoberto Ligas, PA-C [facility] [phone]   --- For [facility] use ---   Modified Rankin score at D/C (0-6): 0  IV medication needed for:  1. Hypertension: No 2. Hypotension: No  Post-op Complications: No  1. Post-op CVA or TIA: No  If yes: Event classification (right eye, left eye, right cortical, left cortical, verterobasilar, other):   If yes: Timing of event (intra-op, <6 hrs post-op, >=6 hrs post-op, unknown):   2. CN injury: No  If yes: CN  injuried   3. Myocardial infarction: No  If yes: Dx by (EKG or clinical, Troponin):   4.  CHF: No  5.  Dysrhythmia (new): No  6. Wound infection: No  7. Reperfusion symptoms: No  8. Return to OR: No  If yes: return to OR for (bleeding, neurologic, other CEA incision, other):   Discharge medications: Statin use:  Yes ASA use:  Yes   Beta blocker use:  Yes ACE-Inhibitor use:  Yes  ARB use:  No CCB use: Yes P2Y12 Antagonist use: Yes, [ x]  Plavix, [ ]  Plasugrel, [ ]  Ticlopinine, [ ]  Ticagrelor, [ ]  Other, [ ]  No for medical reason, [ ]  Non-compliant, [ ]  Not-indicated Anti-coagulant use:  No, [ ]  Warfarin, [ ]  Rivaroxaban, [ ]  Dabigatran,

## 2021-04-28 ENCOUNTER — Encounter: Payer: Self-pay | Admitting: Genetic Counselor

## 2021-04-28 ENCOUNTER — Telehealth: Payer: Self-pay | Admitting: Genetic Counselor

## 2021-04-28 DIAGNOSIS — Z1379 Encounter for other screening for genetic and chromosomal anomalies: Secondary | ICD-10-CM | POA: Insufficient documentation

## 2021-04-28 DIAGNOSIS — Z1509 Genetic susceptibility to other malignant neoplasm: Secondary | ICD-10-CM | POA: Insufficient documentation

## 2021-04-28 NOTE — Telephone Encounter (Signed)
I contacted Mr. Chlebowski to discuss his genetic testing results. 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 Of note, a variant of uncertain significance was detected in the ATM (p.A869G), BLM (c.800-3T>G), and SDHA (p.V632F) genes. Detailed clinic note to follow.  The test report has been scanned into EPIC and is located under the Molecular Pathology section of the Results Review tab.  A portion of the result report is included below for reference.   Lucille Passy, MS, [facility] Genetic Counselor [email or web address] (P) [phone]

## 2021-04-28 NOTE — Progress Notes (Signed)
Dr Burr Medico would like to to [name] next week.  Lab and f/u appt made for 05/03/2021.  Reviewed appt date and time with  [name].  All questions were answered.  HE verbalized understanding.

## 2021-04-30 ENCOUNTER — Other Ambulatory Visit: Payer: Self-pay | Admitting: Internal Medicine

## 2021-05-03 ENCOUNTER — Inpatient Hospital Stay: Payer: Medicare Other | Admitting: Hematology

## 2021-05-03 ENCOUNTER — Inpatient Hospital Stay: Payer: Medicare Other | Attending: Nurse Practitioner

## 2021-05-03 ENCOUNTER — Inpatient Hospital Stay: Payer: Medicare Other | Admitting: Genetic Counselor

## 2021-05-03 ENCOUNTER — Encounter: Payer: Self-pay | Admitting: Hematology

## 2021-05-03 VITALS — BP 152/64 | HR 88 | Temp 98.6°F | Resp 18 | Ht 66.0 in | Wt 222.5 lb

## 2021-05-03 DIAGNOSIS — Z7982 Long term (current) use of aspirin: Secondary | ICD-10-CM | POA: Insufficient documentation

## 2021-05-03 DIAGNOSIS — Z9221 Personal history of antineoplastic chemotherapy: Secondary | ICD-10-CM | POA: Diagnosis not present

## 2021-05-03 DIAGNOSIS — Z85038 Personal history of other malignant neoplasm of large intestine: Secondary | ICD-10-CM | POA: Insufficient documentation

## 2021-05-03 DIAGNOSIS — Z8546 Personal history of malignant neoplasm of prostate: Secondary | ICD-10-CM | POA: Diagnosis not present

## 2021-05-03 DIAGNOSIS — Z923 Personal history of irradiation: Secondary | ICD-10-CM | POA: Insufficient documentation

## 2021-05-03 DIAGNOSIS — Z5112 Encounter for antineoplastic immunotherapy: Secondary | ICD-10-CM | POA: Insufficient documentation

## 2021-05-03 DIAGNOSIS — C170 Malignant neoplasm of duodenum: Secondary | ICD-10-CM | POA: Insufficient documentation

## 2021-05-03 DIAGNOSIS — D509 Iron deficiency anemia, unspecified: Secondary | ICD-10-CM | POA: Diagnosis not present

## 2021-05-03 DIAGNOSIS — R202 Paresthesia of skin: Secondary | ICD-10-CM

## 2021-05-03 DIAGNOSIS — Z79899 Other long term (current) drug therapy: Secondary | ICD-10-CM | POA: Diagnosis not present

## 2021-05-03 LAB — CBC WITH DIFFERENTIAL (CANCER CENTER ONLY)
Abs Immature Granulocytes: 0.04 10*3/uL (ref 0.00–0.07)
Basophils Absolute: 0 10*3/uL (ref 0.0–0.1)
Basophils Relative: 0 %
Eosinophils Absolute: 0.3 10*3/uL (ref 0.0–0.5)
Eosinophils Relative: 3 %
HCT: 26.4 % — ABNORMAL LOW (ref 39.0–52.0)
Hemoglobin: 8.1 g/dL — ABNORMAL LOW (ref 13.0–17.0)
Immature Granulocytes: 0 %
Lymphocytes Relative: 12 %
Lymphs Abs: 1.1 10*3/uL (ref 0.7–4.0)
MCH: 22.6 pg — ABNORMAL LOW (ref 26.0–34.0)
MCHC: 30.7 g/dL (ref 30.0–36.0)
MCV: 73.7 fL — ABNORMAL LOW (ref 80.0–100.0)
Monocytes Absolute: 0.5 10*3/uL (ref 0.1–1.0)
Monocytes Relative: 6 %
Neutro Abs: 7.3 10*3/uL (ref 1.7–7.7)
Neutrophils Relative %: 79 %
Platelet Count: 354 10*3/uL (ref 150–400)
RBC: 3.58 MIL/uL — ABNORMAL LOW (ref 4.22–5.81)
RDW: 18.4 % — ABNORMAL HIGH (ref 11.5–15.5)
WBC Count: 9.2 10*3/uL (ref 4.0–10.5)
nRBC: 0 % (ref 0.0–0.2)

## 2021-05-03 LAB — CMP (CANCER CENTER ONLY)
ALT: 14 U/L (ref 0–44)
AST: 14 U/L — ABNORMAL LOW (ref 15–41)
Albumin: 2.9 g/dL — ABNORMAL LOW (ref 3.5–5.0)
Alkaline Phosphatase: 71 U/L (ref 38–126)
Anion gap: 7 (ref 5–15)
BUN: 14 mg/dL (ref 8–23)
CO2: 27 mmol/L (ref 22–32)
Calcium: 8.7 mg/dL — ABNORMAL LOW (ref 8.9–10.3)
Chloride: 106 mmol/L (ref 98–111)
Creatinine: 1.04 mg/dL (ref 0.61–1.24)
GFR, Estimated: >60 mL/min (ref >60)
Glucose, Bld: 137 mg/dL — ABNORMAL HIGH (ref 70–99)
Potassium: 3.4 mmol/L — ABNORMAL LOW (ref 3.5–5.1)
Sodium: 140 mmol/L (ref 135–145)
Total Bilirubin: 0.6 mg/dL (ref 0.3–1.2)
Total Protein: 6.3 g/dL — ABNORMAL LOW (ref 6.5–8.1)

## 2021-05-03 LAB — VITAMIN B12: Vitamin B-12: 298 pg/mL (ref 180–914)

## 2021-05-03 NOTE — Progress Notes (Signed)
START OFF PATHWAY REGIMEN - Other   [id number]:Pembrolizumab 200 mg IV D1 q21 Days:   A cycle is every 21 days:     Pembrolizumab   **Always confirm dose/schedule in your pharmacy ordering system**  Patient Characteristics: Intent of Therapy: Curative Intent, Discussed with Patient

## 2021-05-03 NOTE — Progress Notes (Signed)
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  Although the scope could not pass patient has no signs of obstruction.  Biopsy confirmed moderate-poorly differentiated adenocarcinoma -CT scan showed multiple enlarged lymph nodes around the pancreatic head and portacaval station, no other distant metastasis. -He was seen by Dr. Barry Dienes, offered a Whipple surgery.  Unfortunately he had ischemic stroke recently, although he recovered well, he cannot have surgery until 3 months later. -I reviewed his MMR results, which showed loss of PMS2.  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 I recommend him to start while he is waiting for surgery.  -I discussed the potential side effect from pembrolizumab, which includes but not limited to, fatigue, rash, arthralgia, pneumonitis, thyroid dysfunction, other endocrine disorder, colitis, etc. patient agrees to proceed. -Plan to start in 1 to 2 weeks -Patient would like to have a port placed, I will ask Dr. Barry Dienes to do his port -will repeat CT after 4 cycles to evaluate his response to treatment   2.  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 -will arrange Venofer 3101m for 203 more doses   3. Recent TIA, s/p left endarterectomy carotid on 04/25/2021 -On aspirin and Plavix -no residual neurodeficits  4. History of prostate cancer and colon cancer  -NED  Plan -I recommend neoadjuvant Keytruda every 3 weeks for 4 cycles then restage  -plan to start in 1-2 weeks -iv Venofer 300 mgX3 -will contact Dr.Byerly for port placement  -f/u with second dose Keytruda   SUMMARY OF ONCOLOGIC HISTORY: Oncology History  [facility]  03/01/2021 Imaging   EXAM: ABDOMEN ULTRASOUND COMPLETE  IMPRESSION: 1. Simple appearing right renal cyst. 2. Otherwise negative abdominal ultrasound   03/18/2021 Procedure   Upper Endoscopy/Colonoscopy, Dr. DLoletha Carrow Upper Endoscopy Impression: - Normal esophagus. - A single submucosal papule (nodule) found in the stomach. - Duodenal mass. Biopsied. Concerning for malignancy.  Colonoscopy Impression: - Diverticulosis in the entire examined colon. - The examined portion of the ileum was normal. - Patent end-to-end colo-colonic anastomosis, characterized by healthy appearing mucosa. - Internal hemorrhoids. - No specimens collected.   03/18/2021 Initial Biopsy   Diagnosis Duodenum, Biopsy - INVASIVE MODERATE TO POORLY DIFFERENTIATED ADENOCARCINOMA   03/22/2021 Initial Diagnosis   [place]   04/01/2021 Imaging   EXAM: CT CHEST, ABDOMEN, AND PELVIS WITH CONTRAST  IMPRESSION: 1. Circumferential wall thickening of the first portion of the duodenum, in keeping with known primary malignancy. 2. Enlarged, hypodense lymph nodes anterior to the pancreatic head and in the portacaval station, concerning for nodal  metastatic disease. 3. No other evidence of metastatic disease  in the chest, abdomen, or pelvis. 4. Incidental note of a fluid attenuation lesion within the proximal pancreatic body measuring 1.9 x 1.4 cm, with prominence of the pancreatic duct distally, up to 0.6 cm. This lesion was present on remote prior examination dated 05/04/2009, and has slightly increased in size over a long period of time. This is consistent with a small IPMN and benign given indolent behavior over greater than 10 years. 5. Background of very fine centrilobular pulmonary nodules, most concentrated in the lung apices, most commonly seen in smoking-related respiratory bronchiolitis. 6. Coronary artery disease.   Aortic Atherosclerosis (ICD10-I70.0).    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 Of note, a variant of uncertain significance was detected in the ATM (p.A869G), BLM (c.800-3T>G), and SDHA (p.V632F) genes. Report date is 04/27/2021.  The CancerNext-Expanded gene panel offered by [facility] and includes sequencing, rearrangement, and RNA analysis for the following 77 genes: AIP, ALK, APC, ATM, AXIN2, BAP1, BARD1, BLM, BMPR1A, BRCA1, BRCA2, BRIP1, CDC73, CDH1, CDK4, CDKN1B, CDKN2A, CHEK2, CTNNA1, DICER1, FANCC, FH, FLCN, GALNT12, KIF1B, LZTR1, MAX, MEN1, MET, MLH1, MSH2, MSH3, MSH6, MUTYH, NBN, NF1, NF2, NTHL1, PALB2, PHOX2B, PMS2, POT1, PRKAR1A, PTCH1, PTEN, RAD51C, RAD51D, RB1, RECQL, RET, SDHA, SDHAF2, SDHB, SDHC, SDHD, SMAD4, SMARCA4, SMARCB1, SMARCE1, STK11, SUFU, TMEM127, TP53, TSC1, TSC2, VHL and XRCC2 (sequencing and deletion/duplication); EGFR, EGLN1, HOXB13, KIT, MITF, PDGFRA, POLD1, and POLE (sequencing only); EPCAM and GREM1 (deletion/duplication only).      CURRENT THERAPY: pending new adjuvant immunotherapy  INTERVAL HISTORY: Mr. Demarest returns for f/u.  He presents to the clinic with his daughter.  He was recently hospitalized for TIA, he resolved  completely without residual neurodeficits.  Work-up showed significant left carotid artery stenosis, he underwent left carotid endarterectomy on April 25, 2021.  He has recovered well.  He has moderate fatigue, but able to do all ADLs, no other new complaints.   REVIEW OF SYSTEMS:   Constitutional: Denies fevers, chills or abnormal weight loss Eyes: Denies blurriness of vision Ears, nose, mouth, throat, and face: Denies mucositis or sore throat Respiratory: Denies cough, dyspnea or wheezes Cardiovascular: Denies palpitation, chest discomfort or lower extremity swelling Gastrointestinal:  Denies nausea, heartburn or change in bowel habits Skin: Denies abnormal skin rashes Lymphatics: Denies new lymphadenopathy or easy bruising Neurological:Denies numbness, tingling or new weaknesses Behavioral/Psych: Mood is stable, no new changes  All other systems were reviewed with the patient and are negative.  MEDICAL HISTORY:  Past Medical History:  Diagnosis Date   Carotid stenosis, left    Colon cancer ([place]) 2000   Coronary artery disease    per Dr. Jacalyn Lefevre note from 04/13/21   Diverticulosis of colon    GERD (gastroesophageal reflux disease)    Glaucoma    History of chemotherapy 2000   colon cancer   History of radiation therapy 08/06/13- 10/03/13   prostate 7800 cGy 40 sessions, seminal vesicles 5600 cGy 40 sessions   Hyperlipidemia    Hypertension    Left knee DJD    Prostate cancer ([place]) 05/23/2013   gleason 3+4=7, volume 11.5 ml   TIA (transient ischemic attack)     SURGICAL HISTORY: Past Surgical History:  Procedure Laterality Date   COLON SURGERY  2000   colon cancer   COLONOSCOPY     ENDARTERECTOMY Left 04/25/2021   Procedure: LEFT ENDARTERECTOMY CAROTID;  Surgeon: Marty Heck, MD;  Location: [place];  Service: Vascular;  Laterality: Left;   EUS     pancreatic cyst   KNEE ARTHROSCOPY  2007   LT- [facility]   PATCH ANGIOPLASTY Left 04/25/2021   Procedure: PATCH  ANGIOPLASTY XENOSURE 1CM X 6CM;  Surgeon: Marty Heck, MD;  [facility];  Service: Vascular;  Laterality: Left;   PROSTATE BIOPSY  05/23/13   gleason 7, volume 11.5 ml    I have reviewed the social history and family history with the patient and they are unchanged from previous note.  ALLERGIES:  is allergic to lyrica [pregabalin].  MEDICATIONS:  Current Outpatient Medications  Medication Sig Dispense Refill   amLODipine (NORVASC) 10 MG tablet TAKE 1 TABLET BY MOUTH ONCE DAILY 90 tablet 3   aspirin EC 81 MG EC tablet Take 1 tablet (81 mg total) by mouth daily. Swallow whole. 30 tablet 11   atorvastatin (LIPITOR) 40 MG tablet Take 1 tablet (40 mg total) by mouth daily. 90 tablet 3   benazepril (LOTENSIN) 40 MG tablet Take 1 tablet (40 mg total) by mouth daily. 90 tablet 3   carvedilol (COREG) 6.25 MG tablet Take 1 tablet (6.25 mg total) by mouth 2 (two) times daily. 180 tablet 0   clopidogrel (PLAVIX) 75 MG tablet Take 1 tablet (75 mg total) by mouth daily. 21 tablet 0   iron polysaccharides (NU-IRON) 150 MG capsule Take 1 capsule (150 mg total) by mouth 2 (two) times daily. (Patient not taking: Reported on 04/18/2021) 180 capsule 1   pantoprazole (PROTONIX) 40 MG tablet Take 1 tablet (40 mg total) by mouth daily. 90 tablet 3   traMADol (ULTRAM) 50 MG tablet TAKE 1 TABLET BY MOUTH EVERY 6 HOURS AS NEEDED FOR PAIN 120 tablet 1   Travoprost, BAK Free, (TRAVATAN) 0.004 % SOLN ophthalmic solution Place 1 drop into both eyes at bedtime.     VITAMIN D PO Take 1 tablet by mouth daily.     No current facility-administered medications for this visit.    PHYSICAL EXAMINATION: ECOG PERFORMANCE STATUS: 1 - Symptomatic but completely ambulatory  Vitals:   05/03/21 1113  BP: (!) 152/64  Pulse: 88  Resp: 18  Temp: 98.6 F (37 C)  SpO2: 100%   Filed Weights   05/03/21 1113  Weight: 222 lb 8 oz (100.9 kg)    GENERAL:alert, no distress and comfortable SKIN: skin color, texture,  turgor are normal, no rashes or significant lesions EYES: normal, Conjunctiva are pink and non-injected, sclera clear Musculoskeletal:no cyanosis of digits and no clubbing  NEURO: alert & oriented x 3 with fluent speech, no focal motor/sensory deficits  LABORATORY DATA:  I have reviewed the data as listed CBC Latest Ref Rng & Units 05/03/2021 04/26/2021 04/25/2021  WBC 4.0 - 10.5 K/uL 9.2 9.6 9.3  Hemoglobin 13.0 - 17.0 g/dL 8.1(L) 7.0(L) 7.2(L)  Hematocrit 39.0 - 52.0 % 26.4(L) 22.6(L) 22.9(L)  Platelets 150 - 400 K/uL 354 231 230     CMP Latest Ref Rng & Units 05/03/2021 04/26/2021 04/25/2021  Glucose 70 - 99 mg/dL 137(H) 99 -  BUN 8 - 23 mg/dL 14 10 -  Creatinine 0.61 - 1.24 mg/dL 1.04 1.14 1.05  Sodium 135 - 145 mmol/L 140 141 -  Potassium 3.5 - 5.1 mmol/L 3.4(L) 3.3(L) -  Chloride 98 - 111 mmol/L 106 111 -  CO2 22 - 32 mmol/L 27 23 -  Calcium 8.9 - 10.3 mg/dL 8.7(L) 7.9(L) -  Total Protein 6.5 - 8.1 g/dL 6.3(L) - -  Total Bilirubin 0.3 - 1.2 mg/dL 0.6 - -  Alkaline Phos 38 - 126 U/L 71 - -  AST 15 - 41 U/L 14(L) - -  ALT 0 - 44 U/L 14 - -      RADIOGRAPHIC STUDIES: I have personally reviewed the radiological images as listed and agreed with the findings in the report. No results found.    No orders of the defined types were placed in this encounter.  All questions were answered. The patient knows to call the clinic with any problems, questions or concerns. No barriers to learning was detected. I spent 30 minutes counseling the patient face to face. The total time spent in the appointment was 40 minutes and more than 50% was on counseling and review of test results     [name], MD 05/03/21

## 2021-05-03 NOTE — Progress Notes (Unsigned)
REFERRING PROVIDER: [name], MD [address] [place], [place] [address]  PRIMARY PROVIDER:  Biagio Borg, MD  PRIMARY REASON FOR VISIT:  ***  HPI:   Aaron Moss was previously seen in the [facility] clinic due to a personal and family history of cancer and concerns regarding a hereditary predisposition to cancer. Please refer to our prior cancer genetics clinic note for more information regarding our discussion, assessment and recommendations, at the time.   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 We reviewed his results, cancer risks, and management recommendations as noted below.   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 Simple appearing right renal cyst. 2. Otherwise negative abdominal ultrasound   03/18/2021 Procedure   Upper Endoscopy/Colonoscopy, Dr. Loletha Carrow  Upper Endoscopy Impression: - Normal esophagus. - A single submucosal papule (nodule) found in the stomach. - Duodenal mass. Biopsied. Concerning for malignancy.  Colonoscopy Impression: - Diverticulosis in the entire examined colon. - The examined portion of the ileum was normal. - Patent end-to-end colo-colonic anastomosis, characterized by healthy appearing mucosa. - Internal hemorrhoids. - No specimens collected.   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 Circumferential wall thickening of the first portion of the duodenum, in keeping with known primary malignancy. 2. Enlarged, hypodense lymph nodes anterior to the pancreatic head  and in the portacaval station, concerning for nodal metastatic disease. 3. No other evidence of metastatic disease in the chest, abdomen, or pelvis. 4. Incidental note of a fluid attenuation lesion within the proximal pancreatic body measuring 1.9 x 1.4 cm, with prominence of the pancreatic duct distally, up to 0.6 cm. This lesion was present on remote prior examination dated 05/04/2009, and has slightly increased in size over a long period of time. This is consistent with a small IPMN and benign given indolent behavior over greater than 10 years. 5. Background of very fine centrilobular pulmonary nodules, most concentrated in the lung apices, most commonly seen in smoking-related respiratory bronchiolitis. 6. Coronary artery disease.   Aortic Atherosclerosis (ICD10-I70.0).    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 Of note, a variant of uncertain significance was detected in the ATM (p.A869G), BLM (c.800-3T>G), and SDHA (p.V632F) genes. Report date is 04/27/2021.  The CancerNext-Expanded gene panel offered by [facility] and includes sequencing, rearrangement, and RNA analysis for the following 77 genes: AIP, ALK, APC, ATM, AXIN2, BAP1, BARD1, BLM, BMPR1A, BRCA1, BRCA2, BRIP1, CDC73, CDH1, CDK4, CDKN1B, CDKN2A, CHEK2, CTNNA1, DICER1, FANCC, FH, FLCN, GALNT12, KIF1B, LZTR1, MAX, MEN1, MET, MLH1, MSH2, MSH3, MSH6, MUTYH, NBN, NF1, NF2, NTHL1, PALB2, PHOX2B, PMS2, POT1, PRKAR1A, PTCH1, PTEN, RAD51C, RAD51D, RB1, RECQL, RET, SDHA, SDHAF2, SDHB, SDHC, SDHD, SMAD4, SMARCA4, SMARCB1, SMARCE1, STK11, SUFU, TMEM127, TP53, TSC1, TSC2, VHL and XRCC2 (sequencing and deletion/duplication); EGFR, EGLN1, HOXB13, KIT, MITF, PDGFRA, POLD1, and POLE (sequencing only); EPCAM and GREM1 (deletion/duplication only).      FAMILY HISTORY:  We obtained a detailed, 4-generation family history.  Significant  diagnoses are listed below:  Family History  Problem Relation Age of Onset   Cancer Mother          Liver? bladder?, dx. late 63s   Colon cancer Sister 24   Cancer Brother          Lung   Diabetes Brother     Cancer Maternal Aunt          unknown type   Breast cancer Maternal Grandmother          dx. >50   Cancer Daughter 44        colon   Esophageal cancer Neg Hx     Rectal cancer Neg Hx     Stomach cancer Neg Hx         Aaron Moss daughter was diagnosed with colon cancer at age 80. His sister was diagnosed with colon cancer at age 37. His brother was diagnosed with lung cancer in his 49s, he smoked and is deceased. His mother was diagnosed with cancer in her 20s, the cancer was either liver or bladder cancer, she died at age 78. His maternal aunt was diagnosed with an unknown cancer at an unknown age, she is deceased. His maternal grandmother was diagnosed with breast cancer at an unknown age (>50). Aaron Moss is unaware of previous family history of genetic testing for hereditary cancer risks. There is no reported Ashkenazi Jewish ancestry.   GENETIC TEST RESULTS:  The *** Panel found no pathogenic mutations. {GENE LIST}.   The test report has been scanned into EPIC and is located under the Molecular Pathology section of the Results Review tab.  A portion of the result report is included below for reference. Genetic testing reported out on {date}.   ***   ***Genetic testing identified a variant of uncertain significance (VUS) in the *** gene called ***.  At this time, it is unknown if this variant is associated with an increased risk for cancer or if it is benign, but most uncertain variants are reclassified to benign. It should not be used to make medical management decisions. With time, we suspect the laboratory will determine the significance of this variant, if any. If the laboratory reclassifies this variant, we will attempt to contact Mr. Whitenight to discuss it further.    Even though a pathogenic variant was not identified, possible explanations for the cancer in the family may include: There may be no hereditary risk for cancer in the family. The cancers in Mr. Moroney ***and/or his family may be due to other genetic or environmental factors. There may be a gene mutation in one of these genes that current testing methods cannot detect, but that chance is small. There could be another gene that has not yet been discovered, or that we have not yet tested, that is responsible for the cancer diagnoses in the family.  It is also possible there is a hereditary cause for the cancer in the family that Aaron Moss did not inherit. The variant of uncertain significance detected in the *** gene may be reclassified as a pathogenic variant in the future. At this time, we do not know if this variant increases the risk for cancer.***  Therefore, it is important to remain in touch with cancer genetics in the future so that we can continue to offer Mr. Pless the most up to date genetic testing.   ***[For true negative] We recommended Aaron Moss pursue testing for the familial hereditary cancer gene mutation called ***, ***. Aaron Moss test was normal and  did not reveal the familial mutation. We call this result a true negative result because the cancer-causing mutation was identified in Aaron Moss family, and he did not inherit it.  Given this negative result, Aaron Moss chances of developing ***-related cancers are the same as they are in the general population.    ADDITIONAL GENETIC TESTING:  We discussed with Aaron Moss that his genetic testing was fairly extensive.  If there are genes identified to increase cancer risk that can be analyzed in the future, we would be happy to discuss and coordinate this testing at that time.    ***We discussed with Mr. Pantaleo that there are other genes that are associated with increased cancer risk that can be analyzed. Should Mr. Miller  wish to pursue additional genetic testing, we are happy to discuss and coordinate this testing, at any time.    CANCER SCREENING RECOMMENDATIONS:  Aaron Moss test result is considered negative (normal).  This means that we have not identified a hereditary cause for his {Personal/family:20331} history of {cancer/polyps} at this time. ***Most cancers happen by chance and this negative test suggests that his cancer may fall into this category.    An individual's cancer risk and medical management are not determined by genetic test results alone. Overall cancer risk assessment incorporates additional factors, including personal medical history, family history, and any available genetic information that may result in a personalized plan for cancer prevention and surveillance. Therefore, it is recommended he continue to follow the cancer management and screening guidelines provided by his ***oncology and primary healthcare provider.  ***Based on the reported personal and family history, specific cancer screenings for Aaron Moss and his family include:  ***Breast Cancer Screening:  The Tyrer-Cuzick model is one of multiple prediction models developed to estimate an individual's lifetime risk of developing breast cancer. The Tyrer-Cuzick model is endorsed by the [employer]  (NCCN). This model includes many risk factors such as family history, endogenous estrogen exposure, and benign breast disease. The calculation is highly-dependent on the accuracy of clinical data provided by the patient and can change over time. The Tyrer-Cuzick model may be repeated to reflect new information in his personal or family history in the future.   Ms. Samons Tyrer-Cuzick risk score is ***%.  For women with a greater than 20% lifetime risk of breast cancer, the NCCN recommends the following:    1.   Clinical encounter every 6-12 months to begin when identified as being at increased risk,  but not before age 59    2.   Annual mammograms, tomosynthesis is recommended starting 10 years earlier than the youngest breast cancer diagnosis in the family or at age 61 (whichever comes first), but not before age 71     3.   Annual breast MRI starting 10 years earlier than the youngest breast cancer diagnosis in the family or at age 45 (whichever comes first), but not before age 35  ***[Breast High Risk FU]  We, therefore, discussed that it is reasonable for Aaron Moss to be followed by a high-risk breast cancer clinic; in addition to a yearly mammogram and physical exam by a healthcare provider, he should discuss the usefulness of an annual breast MRI with the high-risk clinic providers.  ***Colon Cancer Screening: Due to Aaron Moss ***'s history of colon cancer, *** is recommended to begin colonoscopies at age *** (10 years prior to ***'s age at diagnosis) and repeat every 5 years. More frequent colonoscopies may be  recommended if polyps are identified.  RECOMMENDATIONS FOR FAMILY MEMBERS:   Since he did not inherit a mutation in a cancer predisposition gene included on this panel, his children could not have inherited a mutation from him in one of these genes. Individuals in this family might be at some increased risk of developing cancer, over the general population risk, due to the family history of cancer. We recommend women in this family have a yearly mammogram beginning at age 67, or 55 years younger than the earliest onset of cancer, an annual clinical breast exam, and perform monthly breast self-exams.***  Other members of the family may still carry a pathogenic variant in one of these genes that Aaron Moss did not inherit. Based on the family history, we recommend his ***, who was diagnosed with *** at age ***, have genetic counseling and testing.  ***We do not recommend familial testing for the *** variant of uncertain significance (VUS).  FOLLOW-UP:  Cancer genetics is a rapidly  advancing field and it is possible that new genetic tests will be appropriate for him and/or his family members in the future. We encouraged him to remain in contact with cancer genetics on an annual basis so we can update his personal and family histories and let him know of advances in cancer genetics that may benefit this family.   Our contact number was provided. Aaron Moss questions were answered to his satisfaction, and he knows he is welcome to call us at anytime with additional questions or concerns.   Lucille Passy, MS, [facility] Genetic Counselor [email or web address] (P) [phone]    Aaron Moss's questions were answered to his satisfaction today. Our contact information was provided should additional questions or concerns arise. Thank you for the referral and allowing [place] to share in the care of your patient.   Lucille Passy, MS, [facility] Genetic Counselor [email or web address] (P) [phone]  The patient was seen for a total of *** minutes in face-to-face genetic counseling.  ***The patient brought ***.  ***The patient was seen alone.  Drs. Lindi Adie and/or Burr Medico were available to discuss this case as needed.   _______________________________________________________________________ For Office Staff:  Number of people involved in session: *** Was an Intern/ student involved with case: {YES/NO:63}

## 2021-05-04 ENCOUNTER — Telehealth: Payer: Self-pay | Admitting: Hematology

## 2021-05-04 NOTE — Telephone Encounter (Signed)
Scheduled follow-up appointments per 2/14 los. Patient is aware. °

## 2021-05-06 ENCOUNTER — Other Ambulatory Visit: Payer: Self-pay

## 2021-05-06 ENCOUNTER — Inpatient Hospital Stay: Payer: Medicare Other

## 2021-05-10 ENCOUNTER — Encounter: Payer: Self-pay | Admitting: Nurse Practitioner

## 2021-05-10 ENCOUNTER — Encounter: Payer: Self-pay | Admitting: Hematology

## 2021-05-11 ENCOUNTER — Other Ambulatory Visit: Payer: Self-pay

## 2021-05-11 ENCOUNTER — Inpatient Hospital Stay: Payer: Medicare Other

## 2021-05-11 VITALS — BP 162/65 | HR 66 | Temp 98.8°F | Resp 18 | Wt 223.0 lb

## 2021-05-11 DIAGNOSIS — C170 Malignant neoplasm of duodenum: Secondary | ICD-10-CM

## 2021-05-11 DIAGNOSIS — Z5112 Encounter for antineoplastic immunotherapy: Secondary | ICD-10-CM | POA: Diagnosis not present

## 2021-05-11 DIAGNOSIS — D500 Iron deficiency anemia secondary to blood loss (chronic): Secondary | ICD-10-CM

## 2021-05-11 LAB — CBC WITH DIFFERENTIAL (CANCER CENTER ONLY)
Abs Immature Granulocytes: 0.04 10*3/uL (ref 0.00–0.07)
Basophils Absolute: 0 10*3/uL (ref 0.0–0.1)
Basophils Relative: 0 %
Eosinophils Absolute: 0.2 10*3/uL (ref 0.0–0.5)
Eosinophils Relative: 3 %
HCT: 24.5 % — ABNORMAL LOW (ref 39.0–52.0)
Hemoglobin: 7.4 g/dL — ABNORMAL LOW (ref 13.0–17.0)
Immature Granulocytes: 1 %
Lymphocytes Relative: 12 %
Lymphs Abs: 1 10*3/uL (ref 0.7–4.0)
MCH: 22.6 pg — ABNORMAL LOW (ref 26.0–34.0)
MCHC: 30.2 g/dL (ref 30.0–36.0)
MCV: 74.9 fL — ABNORMAL LOW (ref 80.0–100.0)
Monocytes Absolute: 0.5 10*3/uL (ref 0.1–1.0)
Monocytes Relative: 5 %
Neutro Abs: 6.7 10*3/uL (ref 1.7–7.7)
Neutrophils Relative %: 79 %
Platelet Count: 300 10*3/uL (ref 150–400)
RBC: 3.27 MIL/uL — ABNORMAL LOW (ref 4.22–5.81)
RDW: 19.1 % — ABNORMAL HIGH (ref 11.5–15.5)
WBC Count: 8.5 10*3/uL (ref 4.0–10.5)
nRBC: 0 % (ref 0.0–0.2)

## 2021-05-11 LAB — CMP (CANCER CENTER ONLY)
ALT: 12 U/L (ref 0–44)
AST: 10 U/L — ABNORMAL LOW (ref 15–41)
Albumin: 3.1 g/dL — ABNORMAL LOW (ref 3.5–5.0)
Alkaline Phosphatase: 85 U/L (ref 38–126)
Anion gap: 6 (ref 5–15)
BUN: 16 mg/dL (ref 8–23)
CO2: 27 mmol/L (ref 22–32)
Calcium: 8.8 mg/dL — ABNORMAL LOW (ref 8.9–10.3)
Chloride: 110 mmol/L (ref 98–111)
Creatinine: 1.03 mg/dL (ref 0.61–1.24)
GFR, Estimated: >60 mL/min (ref >60)
Glucose, Bld: 154 mg/dL — ABNORMAL HIGH (ref 70–99)
Potassium: 3.9 mmol/L (ref 3.5–5.1)
Sodium: 143 mmol/L (ref 135–145)
Total Bilirubin: 0.7 mg/dL (ref 0.3–1.2)
Total Protein: 6.2 g/dL — ABNORMAL LOW (ref 6.5–8.1)

## 2021-05-11 LAB — PREPARE RBC (CROSSMATCH)

## 2021-05-11 MED ORDER — DIPHENHYDRAMINE HCL 25 MG PO CAPS
25.0000 mg | ORAL_CAPSULE | Freq: Once | ORAL | Status: AC
  Administered 2021-05-11: 25 mg via ORAL
  Filled 2021-05-11: qty 1

## 2021-05-11 MED ORDER — SODIUM CHLORIDE 0.9 % IV SOLN: Freq: Once | INTRAVENOUS | Status: AC

## 2021-05-11 MED ORDER — SODIUM CHLORIDE 0.9 % IV SOLN
300.0000 mg | Freq: Once | INTRAVENOUS | Status: AC
  Administered 2021-05-11: 300 mg via INTRAVENOUS
  Filled 2021-05-11: qty 300

## 2021-05-11 MED ORDER — SODIUM CHLORIDE 0.9% IV SOLUTION
250.0000 mL | Freq: Once | INTRAVENOUS | Status: AC
  Administered 2021-05-11: 250 mL via INTRAVENOUS

## 2021-05-11 MED ORDER — ACETAMINOPHEN 325 MG PO TABS
650.0000 mg | ORAL_TABLET | Freq: Once | ORAL | Status: AC
  Administered 2021-05-11: 650 mg via ORAL
  Filled 2021-05-11: qty 2

## 2021-05-11 MED ORDER — SODIUM CHLORIDE 0.9 % IV SOLN
200.0000 mg | Freq: Once | INTRAVENOUS | Status: AC
  Administered 2021-05-11: 200 mg via INTRAVENOUS
  Filled 2021-05-11: qty 200

## 2021-05-11 NOTE — Progress Notes (Signed)
Ok to treat today per Dr. Burr Medico with Hgb of 7.4

## 2021-05-11 NOTE — Progress Notes (Addendum)
Per Dr. Burr Medico, "OK To Treat w/Hgb 7.4 today".  Placed orders for 1 unit of PRBCs to be transfused today.  Beth in Blood [facility] confirmed orders for T&S and Prepare order.

## 2021-05-11 NOTE — Patient Instructions (Signed)
Aaron Moss  Discharge Instructions: Thank you for choosing [place] to provide your Moss and hematology care.   If you have a lab appointment with the [name], please go directly to the [facility] and check in at the registration area.   Wear comfortable clothing and clothing appropriate for easy access to any Portacath or PICC line.   We strive to give you quality time with your provider. You may need to reschedule your appointment if you arrive late (15 or more minutes).  Arriving late affects you and other patients whose appointments are after yours.  Also, if you miss three or more appointments without notifying the office, you may be dismissed from the clinic at the providers discretion.      For prescription refill requests, have your pharmacy contact our office and allow 72 hours for refills to be completed.    Today you received the following chemotherapy and/or immunotherapy agents Keytruda    To help prevent nausea and vomiting after your treatment, we encourage you to take your nausea medication as directed.  BELOW ARE SYMPTOMS THAT SHOULD BE REPORTED IMMEDIATELY: *FEVER GREATER THAN 100.4 F (38 C) OR HIGHER *CHILLS OR SWEATING *NAUSEA AND VOMITING THAT IS NOT CONTROLLED WITH YOUR NAUSEA MEDICATION *UNUSUAL SHORTNESS OF BREATH *UNUSUAL BRUISING OR BLEEDING *URINARY PROBLEMS (pain or burning when urinating, or frequent urination) *BOWEL PROBLEMS (unusual diarrhea, constipation, pain near the anus) TENDERNESS IN MOUTH AND THROAT WITH OR WITHOUT PRESENCE OF ULCERS (sore throat, sores in mouth, or a toothache) UNUSUAL RASH, SWELLING OR PAIN  UNUSUAL VAGINAL DISCHARGE OR ITCHING   Items with * indicate a potential emergency and should be followed up as soon as possible or go to the Emergency Department if any problems should occur.  Please show the CHEMOTHERAPY ALERT CARD or IMMUNOTHERAPY ALERT CARD at check-in to the  Emergency Department and triage nurse.  Should you have questions after your visit or need to cancel or reschedule your appointment, please contact [facility]  Dept: [phone]  and follow the prompts.  Office hours are 8:00 a.m. to 4:30 p.m. Monday - Friday. Please note that voicemails left after 4:00 p.m. may not be returned until the following business day.  We are closed weekends and major holidays. You have access to a nurse at all times for urgent questions. Please call the main number to the clinic Dept: [phone] and follow the prompts.   For any non-urgent questions, you may also contact your provider using MyChart. We now offer e-Visits for anyone 67 and older to request care online for non-urgent symptoms. For details visit [email or web address].   Also download the MyChart app! Go to the app store, search "MyChart", open the app, select [place], and log in with your MyChart username and password.  Due to Covid, a mask is required upon entering the hospital/clinic. If you do not have a mask, one will be given to you upon arrival. For doctor visits, patients may have 1 support person aged 63 or older with them. For treatment visits, patients cannot have anyone with them due to current Covid guidelines and our immunocompromised population.   Iron Sucrose Injection What is this medication? IRON SUCROSE (EYE ern SOO krose) treats low levels of iron (iron deficiency anemia) in people with kidney disease. Iron is a mineral that plays an important role in making red blood cells, which carry oxygen from your lungs to the rest of your  body. This medicine may be used for other purposes; ask your health care provider or pharmacist if you have questions. COMMON BRAND NAME(S): Venofer What should I tell my care team before I take this medication? They need to know if you have any of these conditions: Anemia not caused by low iron levels Heart disease High  levels of iron in the blood Kidney disease Liver disease An unusual or allergic reaction to iron, other medications, foods, dyes, or preservatives Pregnant or trying to get pregnant Breast-feeding How should I use this medication? This medication is for infusion into a vein. It is given in a hospital or clinic setting. Talk to your care team about the use of this medication in children. While this medication may be prescribed for children as young as 2 years for selected conditions, precautions do apply. Overdosage: If you think you have taken too much of this medicine contact a poison control center or emergency room at once. NOTE: This medicine is only for you. Do not share this medicine with others. What if I miss a dose? It is important not to miss your dose. Call your care team if you are unable to keep an appointment. What may interact with this medication? Do not take this medication with any of the following: Deferoxamine Dimercaprol Other iron products This medication may also interact with the following: Chloramphenicol Deferasirox This list may not describe all possible interactions. Give your health care provider a list of all the medicines, herbs, non-prescription drugs, or dietary supplements you use. Also tell them if you smoke, drink alcohol, or use illegal drugs. Some items may interact with your medicine. What should I watch for while using this medication? Visit your care team regularly. Tell your care team if your symptoms do not start to get better or if they get worse. You may need blood work done while you are taking this medication. You may need to follow a special diet. Talk to your care team. Foods that contain iron include: whole grains/cereals, dried fruits, beans, or peas, leafy green vegetables, and organ meats (liver, kidney). What side effects may I notice from receiving this medication? Side effects that you should report to your care team as soon as  possible: Allergic reactions--skin rash, itching, hives, swelling of the face, lips, tongue, or throat Low blood pressure--dizziness, feeling faint or lightheaded, blurry vision Shortness of breath Side effects that usually do not require medical attention (report to your care team if they continue or are bothersome): Flushing Headache Joint pain Muscle pain Nausea Pain, redness, or irritation at injection site This list may not describe all possible side effects. Call your doctor for medical advice about side effects. You may report side effects to FDA at 1-800-FDA-1088. Where should I keep my medication? This medication is given in a hospital or clinic and will not be stored at home. NOTE: This sheet is a summary. It may not cover all possible information. If you have questions about this medicine, talk to your doctor, pharmacist, or health care provider.  2022 Elsevier/Gold Standard (2020-07-30 00:00:00)  Blood Transfusion, Adult, Care After This sheet gives you information about how to care for yourself after your procedure. Your doctor may also give you more specific instructions. If you have problems or questions, contact your doctor. What can I expect after the procedure? After the procedure, it is common to have: Bruising and soreness at the IV site. A headache. Follow these instructions at home: Insertion site care   Follow  instructions from your doctor about how to take care of your insertion site. This is where an IV tube was put into your vein. Make sure you: Wash your hands with soap and water before and after you change your bandage (dressing). If you cannot use soap and water, use hand sanitizer. Change your bandage as told by your doctor. Check your insertion site every day for signs of infection. Check for: Redness, swelling, or pain. Bleeding from the site. Warmth. Pus or a bad smell. General instructions Take over-the-counter and prescription medicines only as  told by your doctor. Rest as told by your doctor. Go back to your normal activities as told by your doctor. Keep all follow-up visits as told by your doctor. This is important. Contact a doctor if: You have itching or red, swollen areas of skin (hives). You feel worried or nervous (anxious). You feel weak after doing your normal activities. You have redness, swelling, warmth, or pain around the insertion site. You have blood coming from the insertion site, and the blood does not stop with pressure. You have pus or a bad smell coming from the insertion site. Get help right away if: You have signs of a serious reaction. This may be coming from an allergy or the body's defense system (immune system). Signs include: Trouble breathing or shortness of breath. Swelling of the face or feeling warm (flushed). Fever or chills. Head, chest, or back pain. Dark pee (urine) or blood in the pee. Widespread rash. Fast heartbeat. Feeling dizzy or light-headed. You may receive your blood transfusion in an outpatient setting. If so, you will be told whom to contact to report any reactions. These symptoms may be an emergency. Do not wait to see if the symptoms will go away. Get medical help right away. Call your local emergency services (911 in the [place] Do not drive yourself to the hospital. Summary Bruising and soreness at the IV site are common. Check your insertion site every day for signs of infection. Rest as told by your doctor. Go back to your normal activities as told by your doctor. Get help right away if you have signs of a serious reaction. This information is not intended to replace advice given to you by your health care provider. Make sure you discuss any questions you have with your health care provider. Document Revised: 07/01/2020 Document Reviewed: 08/29/2018 Elsevier Patient Education  [place].

## 2021-05-12 ENCOUNTER — Telehealth: Payer: Self-pay

## 2021-05-12 LAB — TYPE AND SCREEN
ABO/RH(D): A POS
Antibody Screen: NEGATIVE
Unit division: 0

## 2021-05-12 LAB — BPAM RBC
Blood Product Expiration Date: 202303042359
ISSUE DATE / TIME: 202302221210
Unit Type and Rh: 6200

## 2021-05-12 NOTE — Telephone Encounter (Signed)
Aaron Moss states that he is doing fine. He is eating, drinking, and urinating well. He knows to call the office at [phone] if he has any questions or concerns.

## 2021-05-12 NOTE — Telephone Encounter (Signed)
-----   Message from Tildon Husky, RN sent at 05/11/2021  1:58 PM EST ----- Regarding: first time treatment follow up call - Aaron Moss Patiet received treatment for the first time today. He is followed by Dr. [name] . He received [name] with no issues.

## 2021-05-17 ENCOUNTER — Encounter: Payer: Self-pay | Admitting: Hematology

## 2021-05-17 ENCOUNTER — Encounter: Payer: Self-pay | Admitting: Vascular Surgery

## 2021-05-17 ENCOUNTER — Other Ambulatory Visit: Payer: Self-pay

## 2021-05-17 ENCOUNTER — Ambulatory Visit (INDEPENDENT_AMBULATORY_CARE_PROVIDER_SITE_OTHER): Payer: Medicare Other | Admitting: Vascular Surgery

## 2021-05-17 VITALS — BP 154/65 | HR 82 | Temp 98.3°F | Resp 20 | Ht 66.0 in | Wt 220.0 lb

## 2021-05-17 DIAGNOSIS — I6522 Occlusion and stenosis of left carotid artery: Secondary | ICD-10-CM

## 2021-05-17 NOTE — Progress Notes (Signed)
Patient name: Aaron Moss MRN: [id number] DOB: [birth year] Sex: male  REASON FOR VISIT: Postop check after left carotid endarterectomy  HPI: Aaron Moss is a 76 y.o. male with multiple medical comorbidities that presents for postop check after left carotid endarterectomy.  He had a symptomatic high-grade stenosis on the left and presented with TIA with right arm weakness.  He underwent left CEA on 04/25/2021.  He is doing well today and his incision is healing without issue.  No further neurologic events.  A little numbness under the jaw that is improving.  He is being evaluated by Dr. Barry Dienes for possible [place].  Past Medical History:  Diagnosis Date   Carotid stenosis, left    Colon cancer ([facility]) 2000   Coronary artery disease    per Dr. Jacalyn Lefevre note from 04/13/21   Diverticulosis of colon    GERD (gastroesophageal reflux disease)    Glaucoma    History of chemotherapy 2000   colon cancer   History of radiation therapy 08/06/13- 10/03/13   prostate 7800 cGy 40 sessions, seminal vesicles 5600 cGy 40 sessions   Hyperlipidemia    Hypertension    Left knee DJD    Prostate cancer ([facility]) 05/23/2013   gleason 3+4=7, volume 11.5 ml   TIA (transient ischemic attack)     Past Surgical History:  Procedure Laterality Date   COLON SURGERY  2000   colon cancer   COLONOSCOPY     ENDARTERECTOMY Left 04/25/2021   Procedure: LEFT ENDARTERECTOMY CAROTID;  Surgeon: Marty Heck, MD;  Location: [facility];  Service: Vascular;  Laterality: Left;   EUS     pancreatic cyst   KNEE ARTHROSCOPY  2007   LT- [facility]   PATCH ANGIOPLASTY Left 04/25/2021   Procedure: PATCH ANGIOPLASTY XENOSURE 1CM X 6CM;  Surgeon: Marty Heck, MD;  Location: [facility] OR;  Service: Vascular;  Laterality: Left;   PROSTATE BIOPSY  05/23/13   gleason 7, volume 11.5 ml    Family History  Problem Relation Age of Onset   Cancer Mother        Liver? bladder?, dx. late 78s   Colon cancer Sister 59   Cancer Brother         Lung   Diabetes Brother    Cancer Maternal Aunt        unknown type   Breast cancer Maternal Grandmother        dx. >50   Cancer Daughter 75       colon   Esophageal cancer Neg Hx    Rectal cancer Neg Hx    Stomach cancer Neg Hx     SOCIAL HISTORY: Social History   Tobacco Use   Smoking status: Former    Packs/day: 1.00    Years: 22.00    Pack years: 22.00    Types: Cigarettes    Quit date: 03/20/1988    Years since quitting: 33.1   Smokeless tobacco: Never  Substance Use Topics   Alcohol use: Not Currently    Comment: rare    Allergies  Allergen Reactions   Lyrica [Pregabalin] Other (See Comments)    "Just made me feel bad"    Current Outpatient Medications  Medication Sig Dispense Refill   amLODipine (NORVASC) 10 MG tablet TAKE 1 TABLET BY MOUTH ONCE DAILY 90 tablet 3   aspirin EC 81 MG EC tablet Take 1 tablet (81 mg total) by mouth daily. Swallow whole. 30 tablet 11   atorvastatin (LIPITOR) 40  MG tablet Take 1 tablet (40 mg total) by mouth daily. 90 tablet 3   benazepril (LOTENSIN) 40 MG tablet Take 1 tablet (40 mg total) by mouth daily. 90 tablet 3   carvedilol (COREG) 6.25 MG tablet Take 1 tablet (6.25 mg total) by mouth 2 (two) times daily. 180 tablet 0   clopidogrel (PLAVIX) 75 MG tablet Take 1 tablet (75 mg total) by mouth daily. 21 tablet 0   iron polysaccharides (NU-IRON) 150 MG capsule Take 1 capsule (150 mg total) by mouth 2 (two) times daily. 180 capsule 1   pantoprazole (PROTONIX) 40 MG tablet Take 1 tablet (40 mg total) by mouth daily. 90 tablet 3   traMADol (ULTRAM) 50 MG tablet TAKE 1 TABLET BY MOUTH EVERY 6 HOURS AS NEEDED FOR PAIN 120 tablet 1   Travoprost, BAK Free, (TRAVATAN) 0.004 % SOLN ophthalmic solution Place 1 drop into both eyes at bedtime.     VITAMIN D PO Take 1 tablet by mouth daily.     No current facility-administered medications for this visit.    REVIEW OF SYSTEMS:  [X]  denotes positive finding, [ ]  denotes negative  finding Cardiac  Comments:  Chest pain or chest pressure:    Shortness of breath upon exertion:    Short of breath when lying flat:    Irregular heart rhythm:        Vascular    Pain in calf, thigh, or hip brought on by ambulation:    Pain in feet at night that wakes you up from your sleep:     Blood clot in your veins:    Leg swelling:         Pulmonary    Oxygen at home:    Productive cough:     Wheezing:         Neurologic    Sudden weakness in arms or legs:     Sudden numbness in arms or legs:     Sudden onset of difficulty speaking or slurred speech:    Temporary loss of vision in one eye:     Problems with dizziness:         Gastrointestinal    Blood in stool:     Vomited blood:         Genitourinary    Burning when urinating:     Blood in urine:        Psychiatric    Major depression:         Hematologic    Bleeding problems:    Problems with blood clotting too easily:        Skin    Rashes or ulcers:        Constitutional    Fever or chills:      PHYSICAL EXAM: Vitals:   05/17/21 0828 05/17/21 0830  BP: (!) 155/65 (!) 154/65  Pulse: 82   Resp: 20   Temp: 98.3 F (36.8 C)   SpO2: 98%   Weight: 220 lb (99.8 kg)   Height: 5\' 6"  (1.676 m)     GENERAL: The patient is a well-nourished male, in no acute distress. The vital signs are documented above. CARDIAC: There is a regular rate and rhythm.  VASCULAR:  Left neck incision clean dry and intact with no hematoma PULMONARY: No respiratory distress. ABDOMEN: Soft and non-tender. NEUROLOGIC: No focal weakness or paresthesias are detected.  Cranial nerves II through XII grossly intact SKIN: There are no ulcers or rashes noted. PSYCHIATRIC: The patient has a  normal affect.  DATA:   None  Assessment/Plan:  76 year old male status post left carotid enterectomy on 04/25/2021 for symptomatic high-grade calcified stenosis.  He presented with a TIA and had a right upper extremity weakness.  He looks  good from my standpoint and his incision is healing.  Grossly neurologically intact.  A little numbness under the jaw that I hope will improve with time.  Discussed continue aspirin statin for my standpoint.  I will see him in 9 months with carotid duplex for continued surveillance.  Discussed he call with questions or concerns.   Marty Heck, MD [facility] Office: [phone]

## 2021-05-17 NOTE — Progress Notes (Signed)
Called pt to introduce myself as his Arboriculturist.  Unfortunately there aren't any foundations offering copay assistance for his Dx and the type of ins he has.  I informed him of the [employer] and went over what it covers.  Pt would like to apply so he will bring proof of income on 05/18/21.  If approved I will give him an expense sheet and my card for any questions or concerns he may have in the future.

## 2021-05-18 ENCOUNTER — Inpatient Hospital Stay: Payer: Medicare Other | Attending: Nurse Practitioner

## 2021-05-18 VITALS — BP 139/60 | HR 78 | Temp 98.3°F | Resp 18

## 2021-05-18 DIAGNOSIS — Z8546 Personal history of malignant neoplasm of prostate: Secondary | ICD-10-CM | POA: Diagnosis not present

## 2021-05-18 DIAGNOSIS — C170 Malignant neoplasm of duodenum: Secondary | ICD-10-CM | POA: Diagnosis present

## 2021-05-18 DIAGNOSIS — Z5112 Encounter for antineoplastic immunotherapy: Secondary | ICD-10-CM | POA: Insufficient documentation

## 2021-05-18 DIAGNOSIS — Z7902 Long term (current) use of antithrombotics/antiplatelets: Secondary | ICD-10-CM | POA: Diagnosis not present

## 2021-05-18 DIAGNOSIS — Z923 Personal history of irradiation: Secondary | ICD-10-CM | POA: Diagnosis not present

## 2021-05-18 DIAGNOSIS — Z9221 Personal history of antineoplastic chemotherapy: Secondary | ICD-10-CM | POA: Diagnosis not present

## 2021-05-18 DIAGNOSIS — Z7982 Long term (current) use of aspirin: Secondary | ICD-10-CM | POA: Insufficient documentation

## 2021-05-18 DIAGNOSIS — Z79899 Other long term (current) drug therapy: Secondary | ICD-10-CM | POA: Insufficient documentation

## 2021-05-18 DIAGNOSIS — Z8673 Personal history of transient ischemic attack (TIA), and cerebral infarction without residual deficits: Secondary | ICD-10-CM | POA: Diagnosis not present

## 2021-05-18 DIAGNOSIS — D500 Iron deficiency anemia secondary to blood loss (chronic): Secondary | ICD-10-CM | POA: Diagnosis not present

## 2021-05-18 MED ORDER — SODIUM CHLORIDE 0.9 % IV SOLN: Freq: Once | INTRAVENOUS | Status: AC

## 2021-05-18 MED ORDER — SODIUM CHLORIDE 0.9 % IV SOLN
300.0000 mg | Freq: Once | INTRAVENOUS | Status: AC
  Administered 2021-05-18: 300 mg via INTRAVENOUS
  Filled 2021-05-18: qty 300

## 2021-05-18 NOTE — Patient Instructions (Signed)

## 2021-05-18 NOTE — Progress Notes (Signed)
BP 139/60 (BP Location: Left Arm, Patient Position: Sitting)   Pulse 78   Temp 98.3 ?F (36.8 ?C) (Oral)   Resp 18   SpO2 100%  ?Patient sat for his iron with no signs or symptoms od any reaction. Feels "good" and is choosing to leave without 30 minute observation time. IV removed with tip intact. Advised to call emergency services if any significant issues after discharge. ?

## 2021-05-20 ENCOUNTER — Other Ambulatory Visit: Payer: Self-pay | Admitting: *Deleted

## 2021-05-20 DIAGNOSIS — I6522 Occlusion and stenosis of left carotid artery: Secondary | ICD-10-CM

## 2021-05-25 ENCOUNTER — Encounter: Payer: Self-pay | Admitting: Hematology

## 2021-05-25 ENCOUNTER — Other Ambulatory Visit: Payer: Self-pay

## 2021-05-25 ENCOUNTER — Inpatient Hospital Stay: Payer: Medicare Other

## 2021-05-25 VITALS — BP 144/65 | HR 64 | Temp 97.5°F | Resp 16

## 2021-05-25 DIAGNOSIS — D500 Iron deficiency anemia secondary to blood loss (chronic): Secondary | ICD-10-CM

## 2021-05-25 DIAGNOSIS — C170 Malignant neoplasm of duodenum: Secondary | ICD-10-CM

## 2021-05-25 DIAGNOSIS — Z5112 Encounter for antineoplastic immunotherapy: Secondary | ICD-10-CM | POA: Diagnosis not present

## 2021-05-25 MED ORDER — SODIUM CHLORIDE 0.9 % IV SOLN
300.0000 mg | Freq: Once | INTRAVENOUS | Status: AC
  Administered 2021-05-25: 300 mg via INTRAVENOUS
  Filled 2021-05-25: qty 300

## 2021-05-25 NOTE — Patient Instructions (Signed)

## 2021-05-25 NOTE — Progress Notes (Signed)
Pt provided his proof of income to apply for the [employer] but unfortunately he's over the income requirement so he doesn't qualify at this time.  ?

## 2021-05-31 ENCOUNTER — Inpatient Hospital Stay: Admit: 2021-05-31 | Payer: Medicare Other | Admitting: General Surgery

## 2021-05-31 SURGERY — LAPAROSCOPY, DIAGNOSTIC
Anesthesia: General

## 2021-06-02 ENCOUNTER — Other Ambulatory Visit: Payer: Medicare Other

## 2021-06-02 ENCOUNTER — Ambulatory Visit: Payer: Self-pay | Admitting: Genetic Counselor

## 2021-06-02 ENCOUNTER — Other Ambulatory Visit: Payer: Self-pay

## 2021-06-02 ENCOUNTER — Encounter: Payer: Self-pay | Admitting: Hematology

## 2021-06-02 ENCOUNTER — Inpatient Hospital Stay: Payer: Medicare Other

## 2021-06-02 ENCOUNTER — Encounter: Payer: Self-pay | Admitting: Genetic Counselor

## 2021-06-02 ENCOUNTER — Inpatient Hospital Stay (HOSPITAL_BASED_OUTPATIENT_CLINIC_OR_DEPARTMENT_OTHER): Payer: Medicare Other | Admitting: Hematology

## 2021-06-02 ENCOUNTER — Encounter: Payer: Medicare Other | Admitting: Genetic Counselor

## 2021-06-02 VITALS — BP 160/60 | HR 70 | Temp 98.7°F | Resp 18 | Ht 66.0 in | Wt 221.9 lb

## 2021-06-02 DIAGNOSIS — C170 Malignant neoplasm of duodenum: Secondary | ICD-10-CM | POA: Diagnosis not present

## 2021-06-02 DIAGNOSIS — Z5112 Encounter for antineoplastic immunotherapy: Secondary | ICD-10-CM | POA: Diagnosis not present

## 2021-06-02 LAB — CMP (CANCER CENTER ONLY)
ALT: 11 U/L (ref 0–44)
AST: 12 U/L — ABNORMAL LOW (ref 15–41)
Albumin: 3.4 g/dL — ABNORMAL LOW (ref 3.5–5.0)
Alkaline Phosphatase: 97 U/L (ref 38–126)
Anion gap: 5 (ref 5–15)
BUN: 16 mg/dL (ref 8–23)
CO2: 30 mmol/L (ref 22–32)
Calcium: 8.9 mg/dL (ref 8.9–10.3)
Chloride: 107 mmol/L (ref 98–111)
Creatinine: 1.01 mg/dL (ref 0.61–1.24)
GFR, Estimated: >60 mL/min (ref >60)
Glucose, Bld: 102 mg/dL — ABNORMAL HIGH (ref 70–99)
Potassium: 3.4 mmol/L — ABNORMAL LOW (ref 3.5–5.1)
Sodium: 142 mmol/L (ref 135–145)
Total Bilirubin: 0.8 mg/dL (ref 0.3–1.2)
Total Protein: 6.6 g/dL (ref 6.5–8.1)

## 2021-06-02 LAB — IRON AND IRON BINDING CAPACITY (CC-WL,HP ONLY)
Iron: 42 ug/dL — ABNORMAL LOW (ref 45–182)
Saturation Ratios: 17 % — ABNORMAL LOW (ref 17.9–39.5)
TIBC: 246 ug/dL — ABNORMAL LOW (ref 250–450)
UIBC: 204 ug/dL (ref 117–376)

## 2021-06-02 LAB — CBC WITH DIFFERENTIAL (CANCER CENTER ONLY)
Abs Immature Granulocytes: 0.01 10*3/uL (ref 0.00–0.07)
Basophils Absolute: 0 10*3/uL (ref 0.0–0.1)
Basophils Relative: 0 %
Eosinophils Absolute: 0.3 10*3/uL (ref 0.0–0.5)
Eosinophils Relative: 4 %
HCT: 29 % — ABNORMAL LOW (ref 39.0–52.0)
Hemoglobin: 8.7 g/dL — ABNORMAL LOW (ref 13.0–17.0)
Immature Granulocytes: 0 %
Lymphocytes Relative: 21 %
Lymphs Abs: 1.5 10*3/uL (ref 0.7–4.0)
MCH: 24.9 pg — ABNORMAL LOW (ref 26.0–34.0)
MCHC: 30 g/dL (ref 30.0–36.0)
MCV: 83.1 fL (ref 80.0–100.0)
Monocytes Absolute: 0.4 10*3/uL (ref 0.1–1.0)
Monocytes Relative: 5 %
Neutro Abs: 5 10*3/uL (ref 1.7–7.7)
Neutrophils Relative %: 70 %
Platelet Count: 241 10*3/uL (ref 150–400)
RBC: 3.49 MIL/uL — ABNORMAL LOW (ref 4.22–5.81)
RDW: 22 % — ABNORMAL HIGH (ref 11.5–15.5)
WBC Count: 7.2 10*3/uL (ref 4.0–10.5)
nRBC: 0 % (ref 0.0–0.2)

## 2021-06-02 LAB — FERRITIN: Ferritin: 140 ng/mL (ref 24–336)

## 2021-06-02 MED ORDER — SODIUM CHLORIDE 0.9 % IV SOLN
200.0000 mg | Freq: Once | INTRAVENOUS | Status: AC
  Administered 2021-06-02: 200 mg via INTRAVENOUS
  Filled 2021-06-02: qty 200

## 2021-06-02 MED ORDER — POTASSIUM CHLORIDE CRYS ER 10 MEQ PO TBCR: 10.0000 meq | EXTENDED_RELEASE_TABLET | Freq: Two times a day (BID) | ORAL | 0 refills | Status: DC

## 2021-06-02 MED ORDER — SODIUM CHLORIDE 0.9 % IV SOLN: Freq: Once | INTRAVENOUS | Status: AC

## 2021-06-02 NOTE — Progress Notes (Addendum)
 HPI:   Mr. Williamsen was previously seen in the [facility] due to a personal and family history of cancer and concerns regarding a hereditary predisposition to cancer. Please refer to our prior cancer genetics clinic note for more information regarding our discussion, assessment and recommendations, at the time. Mr. Teems recent genetic test results were disclosed to him, as were recommendations warranted by these results. These results and recommendations are discussed in more detail below.  We discussed his genetic testing results in detail during his infusion.    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 Simple appearing right renal cyst. 2. Otherwise negative abdominal ultrasound   03/18/2021 Procedure   Upper Endoscopy/Colonoscopy, Dr. Myrtie Neither  Upper Endoscopy Impression: - Normal esophagus. - A single submucosal papule (nodule) found in the stomach. - Duodenal mass. Biopsied. Concerning for malignancy.  Colonoscopy Impression: - Diverticulosis in the entire examined colon. - The examined portion of the ileum was normal. - Patent end-to-end colo-colonic anastomosis, characterized by healthy appearing mucosa. - Internal hemorrhoids. - No specimens collected.   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 Circumferential wall thickening of the first portion of the duodenum, in keeping with known primary malignancy. 2. Enlarged, hypodense lymph nodes anterior to the pancreatic head and in the portacaval station, concerning for nodal metastatic disease. 3. No other evidence of metastatic disease in the chest, abdomen, or pelvis. 4.  Incidental note of a fluid attenuation lesion within the proximal pancreatic body measuring 1.9 x 1.4 cm, with prominence of the pancreatic duct distally, up to 0.6 cm. This lesion was present on remote prior examination dated 05/04/2009, and has slightly increased in size over a long period of time. This is consistent with a small IPMN and benign given indolent behavior over greater than 10 years. 5. Background of very fine centrilobular pulmonary nodules, most concentrated in the lung apices, most commonly seen in smoking-related respiratory bronchiolitis. 6. Coronary artery disease.   Aortic Atherosclerosis (ICD10-I70.0).    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 Of note, a variant of uncertain significance was detected in the ATM (p.A869G), BLM (c.800-3T>G), and SDHA (p.V632F) genes. Report date is 04/27/2021.  The CancerNext-Expanded gene panel offered by [facility] and includes sequencing, rearrangement, and RNA analysis for the following 77 genes: AIP, ALK, APC, ATM, AXIN2, BAP1, BARD1, BLM, BMPR1A, BRCA1, BRCA2, BRIP1, CDC73, CDH1, CDK4, CDKN1B, CDKN2A, CHEK2, CTNNA1, DICER1, FANCC, FH, FLCN, GALNT12, KIF1B, LZTR1, MAX, MEN1, MET, MLH1, MSH2, MSH3, MSH6, MUTYH, NBN, NF1, NF2, NTHL1, PALB2, PHOX2B, PMS2, POT1, PRKAR1A, PTCH1, PTEN, RAD51C, RAD51D, RB1, RECQL, RET, SDHA, SDHAF2, SDHB, SDHC, SDHD, SMAD4, SMARCA4, SMARCB1, SMARCE1, STK11, SUFU, TMEM127, TP53, TSC1, TSC2, VHL and XRCC2 (sequencing and deletion/duplication); EGFR, EGLN1, HOXB13, KIT, MITF, PDGFRA, POLD1, and POLE (sequencing only); EPCAM and GREM1 (deletion/duplication only).    05/11/2021 -  Chemotherapy   Patient is on Treatment Plan : COLORECTAL Pembrolizumab (200) q21d       FAMILY HISTORY:  We obtained a detailed, 4-generation family history.  Significant diagnoses are listed below:      Family History   Problem Relation Age  of Onset   Cancer Mother          Liver? bladder?, dx. late 40s   Colon cancer Sister 41   Cancer Brother          Lung   Diabetes Brother     Cancer Maternal Aunt          unknown type   Breast cancer Maternal Grandmother          dx. >50   Cancer Daughter 65        colon   Esophageal cancer Neg Hx     Rectal cancer Neg Hx     Stomach cancer Neg Hx         Mr. Samples daughter was diagnosed with colon cancer at age 37. His sister was diagnosed with colon cancer at age 60. His brother was diagnosed with lung cancer in his 36s, he smoked and is deceased. His mother was diagnosed with cancer in her 29s, the cancer was either liver or bladder cancer, she died at age 29. His maternal aunt was diagnosed with an unknown cancer at an unknown age, she is deceased. His maternal grandmother was diagnosed with breast cancer at an unknown age (>50). Mr. Petrosino is unaware of previous family history of genetic testing for hereditary cancer risks. There is no reported Ashkenazi Jewish ancestry.   GENETIC TEST RESULTS:  Mr. 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 Specifically, this variant is [id number].  Genetic testing also identified a variant of uncertain significance (VUS) in the ATM gene (p.A869G), BLM gene (c.800-3T>G), and SDHA gene (p.V632F).  At this time, it is unknown if the variants are associated with an increased risk for cancer or if they are benign, but most uncertain variants are reclassified to benign. It should not be used to make medical management decisions. With time, we suspect the laboratory will determine the significance of the variants, if any. If the laboratory reclassifies the variants, we will attempt to contact Mr. Harman to discuss it further.   UPDATE: The VUS in ATM (p.A869G) was reclassified to likely benign. Report date is 04/13/2023.  The test report has been scanned into EPIC and is  located under the Molecular Pathology section of the Results Review tab.  A portion of the result report is included below for reference. Genetic testing reported out on 04/27/2021.         Clinical Information Pathogenic variants in the PMS2 gene are associated with Lynch syndrome.  The cancers associated with PMS2 are: Colorectal cancer, 8.7-20% risk (average age of diagnosis is 38-66) Endometrial cancer, 13-26% risk (average age of diagnosis is 85-50) Ovarian cancer, up to a 3% risk (average age of diagnosis is 41-59) Renal pelvis and/or ureter, up to a 3.7% risk (average age of diagnosis is unknown) Breast, prostate, pancreatic, gastric, small bowel, bladder, biliary tract, and brain cancer risk may be elevated  Management Recommendations:  Colorectal Cancer Screening: High quality colonoscopy at age 18-35 or 2-5 years prior to the earliest colon cancer if it is diagnosed before age 66 and repeat every 1-3 years. Studies have demonstrated that the use of daily aspirin decreases the colorectal cancer risk in patients with Lynch Syndrome. Ongoing studies are investigating the optimal dose and duration of use of aspirin for colon cancer prevention in Lynch Syndrome. The decision to use aspirin should be made on an individualized basis including a discussion of dose, benefits, and adverse effects.  Endometrial Cancer Screening/Risk Reduction: Women should report any abnormal uterine bleeding or postmenopausal bleeding. The evaluation of these symptoms should include an endometrial biopsy. A hysterectomy may be considered. The timing should be individualized based on whether childbearing is complete, comorbidities, and family history. Endometrial cancer screening does not have a proven benefit in women with Lynch Syndrome. However, endometrial biopsy is highly sensitive and specific as a diagnostic procedure. Screening via endometrial biopsy every 1-2 years starting at age 12-35 can be  considered. Transvaginal ultrasounds may be considered in postmenopausal women at their clinician's discretion.  Ovarian Cancer Screening/Risk Reduction: There is currently insufficient evidence to make a specific recommendation for a prophylactic bilateral salpingo-oophorectomy (BSO), or having the ovaries and fallopian tubes removed, for individuals who carry a PMS2 pathogenic variant. Current data does not support routine ovarian screening for Lynch syndrome, therefore it may be considered at the clinician's discretion. Screening includes transvaginal ultrasounds and a blood test to measure CA-125 levels every 6-12 months. If there is a family history of ovarian cancer, have a discussion with your physician about the benefits and limitations of screening and risk reduction strategies.  Women should be aware of symptoms that might be associated with the development of ovarian cancer including pelvic or abdominal pain, bloating, increased abdominal girth, difficulty eating, early satiety, or urinary frequency or urgency. Symptoms that persist for several weeks and are a change from a woman's baseline should prompt evaluation by her physician.  Urothelial Cancer Screening: Annual urinalysis starting at age 50-35 may be considered in selected individuals such as those with a family history of urothelial cancer (renal pelvis, ureter, and/or bladder).  Gastric and Small Bowel Cancer Screening: While there is no clear evidence to support screening for gastric, duodenal, and small bowel cancer for Lynch Syndrome, select individuals, families, or those of Asian descent may consider esophagogastroduodenoscopy (EGD) with random biopsy of the proximal and distal stomach beginning at 40 and surveillance EDG every 3-5 years   Pancreatic Cancer Screening: Avoid smoking, heavy alcohol use, and obesity. It has been suggested that pancreatic cancer screening be limited to those with a family history of pancreatic  cancer (first- or second-degree relative). Ideally, screening should be performed in experienced centers utilizing a multidisciplinary approach under research conditions. Recommended screening include annual endoscopic ultrasound (preferred) and/or MRI of the pancreas starting at age 20 or 53 years younger than the earliest age diagnosis in the family. Annual concurrent CA19-9 testing should also be considered.  Prostate Cancer Screening: Consider beginning annual PSA blood test and digital rectal exams at age 70.  Breast Cancer Screening/Risk Reduction: Breast awareness beginning at age 23 Monthly self-breast examination beginning at age 12 Annual clinical breast examinations beginning at age 63 Annual mammogram beginning at age 80 with consideration of tomosynthesis Evidence is insufficient for a prophylactic risk-reducing mastectomy, manage based on family history    Brain Cancer Screening: Patients should be educated regarding signs and symptoms of neurologic cancer and the importance of prompt reporting of abnormal symptoms to their physicians.  Additional Considerations: Patients of reproductive age should be made aware of options for prenatal diagnosis and assisted reproduction including pre-implantation genetic diagnosis. Individuals with a single pathogenic PMS2 variant are also carriers of constitutional MMR deficiency (CMMRD) syndrome. CMMRD is a childhood-onset cancer predisposition syndrome that can present with hematological malignancies, cancers of the brain and central nervous system, Lynch syndrome-associated cancers (colon, uterine, small bowel, urinary tract), embryonic tumors, and sarcomas. Some affected individuals may also display cafe-au-lait macules.  For there to be a risk of CMMRD in offspring, an individual and their partner would each have to have a single pathogenic variant in the same MMR gene; in such a case, the risk of having an affected child is 25%.   This  information is based on current understanding of the gene and may change in the future.  Implications for Family Members: Hereditary predisposition to cancer due to pathogenic variants in the PMS2 gene has autosomal dominant inheritance. This means that an individual with a pathogenic variant has a 50% chance of passing the condition on to his/her offspring. Most cases are inherited from a parent, but some cases may occur spontaneously (i.e., an individual with a pathogenic variant has parents who do not have it). Identification of a pathogenic variant allows for the recognition of at-risk relatives who can pursue testing for the familial variant.  Family members are encouraged to consider genetic testing for this familial pathogenic variant. As there are generally no childhood cancer risks associated with pathogenic variants in the PSM2 gene, individuals in the family are not recommended to have testing until they reach at least 78 years of age. They may contact our office at [phone] for more information or to schedule an appointment.  Complimentary testing for the familial variant is available for 90 days from the report date. Family members who live outside of the area are encouraged to find a genetic counselor in their area by visiting: [email or web address].  Resources: [employer]CE (Facing Our Risk of Cancer Empowered) is a resource for those with a hereditary predisposition to develop cancer.  [employer] provides information about risk reduction, advocacy, legislation, and clinical trials.  Additionally, [employer] provides a platform for collaboration and support which includes: peer navigation, message boards, local support groups, a toll-free helpline, research registry and recruitment, advocate training, published medical research, webinars, brochures, mastectomy photos, and more.  For more information, visit www.facingourrisk.org  Hereditary Colon Cancer Foundation-  www.hcctakesguts.org  Lynch Syndrome [employer]- www.lynchcancers.com  [employer]- www.kintalk.org  Our contact number was provided. Mr. Denherder questions were answered to his satisfaction, and he knows he is welcome to call us at anytime with additional questions or concerns.   Lalla Brothers, MS, [facility] Genetic Counselor [email or web address] (P) [phone]

## 2021-06-02 NOTE — Patient Instructions (Signed)
[facility]   Discharge Instructions: Thank you for choosing [facility] to provide your oncology and hematology care.   If you have a lab appointment with the Cancer Center, please go directly to the Cancer Center and check in at the registration area.   Wear comfortable clothing and clothing appropriate for easy access to any Portacath or PICC line.   We strive to give you quality time with your provider. You may need to reschedule your appointment if you arrive late (15 or more minutes).  Arriving late affects you and other patients whose appointments are after yours.  Also, if you miss three or more appointments without notifying the office, you may be dismissed from the clinic at the provider's discretion.      For prescription refill requests, have your pharmacy contact our office and allow 72 hours for refills to be completed.    Today you received the following chemotherapy and/or immunotherapy agents: Pembrolizumab.      To help prevent nausea and vomiting after your treatment, we encourage you to take your nausea medication as directed.  BELOW ARE SYMPTOMS THAT SHOULD BE REPORTED IMMEDIATELY: *FEVER GREATER THAN 100.4 F (38 C) OR HIGHER *CHILLS OR SWEATING *NAUSEA AND VOMITING THAT IS NOT CONTROLLED WITH YOUR NAUSEA MEDICATION *UNUSUAL SHORTNESS OF BREATH *UNUSUAL BRUISING OR BLEEDING *URINARY PROBLEMS (pain or burning when urinating, or frequent urination) *BOWEL PROBLEMS (unusual diarrhea, constipation, pain near the anus) TENDERNESS IN MOUTH AND THROAT WITH OR WITHOUT PRESENCE OF ULCERS (sore throat, sores in mouth, or a toothache) UNUSUAL RASH, SWELLING OR PAIN  UNUSUAL VAGINAL DISCHARGE OR ITCHING   Items with * indicate a potential emergency and should be followed up as soon as possible or go to the Emergency Department if any problems should occur.  Please show the CHEMOTHERAPY ALERT CARD or IMMUNOTHERAPY ALERT CARD at  check-in to the Emergency Department and triage nurse.  Should you have questions after your visit or need to cancel or reschedule your appointment, please contact [facility]  Dept: [phone]  and follow the prompts.  Office hours are 8:00 a.m. to 4:30 p.m. Monday - Friday. Please note that voicemails left after 4:00 p.m. may not be returned until the following business day.  We are closed weekends and major holidays. You have access to a nurse at all times for urgent questions. Please call the main number to the clinic Dept: [phone] and follow the prompts.   For any non-urgent questions, you may also contact your provider using MyChart. We now offer e-Visits for anyone 18 and older to request care online for non-urgent symptoms. For details visit [email or web address].   Also download the MyChart app! Go to the app store, search "MyChart", open the app, select [facility], and log in with your MyChart username and password.  Due to Covid, a mask is required upon entering the hospital/clinic. If you do not have a mask, one will be given to you upon arrival. For doctor visits, patients may have 1 support person aged 18 or older with them. For treatment visits, patients cannot have anyone with them due to current Covid guidelines and our immunocompromised population.   

## 2021-06-02 NOTE — Progress Notes (Signed)
?[facility]   ?Telephone:([phone] [phone]   ?Clinic Follow up Note  ? ?Patient Care Team: ?Biagio Borg, MD as PCP - General ?[name], MD as Consulting Physician (Gastroenterology) ?[name], MD as Consulting Physician (Orthopedic Surgery) ?[name], MD as Consulting Physician (Ophthalmology) ?[name], MD as Consulting Physician (Oncology) ?[name], [name], RN as Sales executive (Oncology) ? ?Date of Service:  06/02/2021 ? ?CHIEF COMPLAINT: f/u of duodenal cancer ? ?CURRENT THERAPY:  ?[name], started 05/11/21 ? ?ASSESSMENT & PLAN:  ?HASSEL UPHOFF is a 76 y.o. male with  ? ?1. 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  Although the scope could not pass patient has no signs of obstruction.  Biopsy confirmed moderate-poorly differentiated adenocarcinoma ?-CT CAP on 04/01/21 showed multiple enlarged lymph nodes around the pancreatic head and portacaval station, no other distant metastasis. ?-He was seen by Dr. Barry Dienes, offered a Whipple surgery.  Unfortunately he had ischemic stroke recently, although he recovered well, he cannot have surgery until 3 months later. 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 I recommend him to start while he is waiting for surgery.  ?-he began [name] on 05/11/21. He tolerated well with mild fatigue. ?-will repeat CT after 4 cycles to evaluate his response to treatment  ?-Lab reviewed, adequate for treatment, will proceed to cycle 2 today ? ?2. Lynch Syndrome, H/o prostate (2015) and colon (2000) cancers ?-he has a personal history of stage pT3N0 colon cancer in 2000 and prostate cancer dx  in 2015, s/p 8 weeks of IMRT with Dr. Valere Dross. ?-genetic testing and counseling on 04/07/21 confirmed a PMS2 mutation, as well as VUS in ATM, BLM, and SDHA. ?  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 ?-he received three doses IV Venofer 2/22-05/25/21.  ?-iron panel pending today, will schedule additional IV iron if needed. ?  ?4. Recent TIA, s/p left endarterectomy carotid on 04/25/21 ?-On aspirin and Plavix ?-no residual neurodeficits ? ?  ?Plan ?-proceed with second cycle Keytruda today ?-lab, f/u, Keytruda, and IV Venofer in 3 weeks ? ? ?No problem-specific Assessment & Plan notes found for this encounter. ? ? ?SUMMARY OF ONCOLOGIC HISTORY: ?Oncology History  ?Adenocarcinoma of duodenum ([place])  ?03/01/2021 Imaging  ? EXAM: ?ABDOMEN ULTRASOUND COMPLETE ? ?IMPRESSION: ?1. Simple appearing right renal cyst. ?2. Otherwise negative abdominal ultrasound ?  ?03/18/2021 Procedure  ? Upper Endoscopy/Colonoscopy, Dr. Loletha Carrow ? ?Upper Endoscopy Impression: ?- Normal esophagus. ?- A single submucosal papule (nodule) found in the stomach. ?- Duodenal mass. Biopsied. Concerning for malignancy. ? ?Colonoscopy Impression: ?- Diverticulosis in the entire examined colon. ?- The examined portion of the ileum was normal. ?- Patent end-to-end colo-colonic anastomosis, characterized by healthy appearing mucosa. ?- Internal hemorrhoids. ?- No specimens collected. ?  ?03/18/2021 Initial Biopsy  ? Diagnosis ?Duodenum, Biopsy ?- INVASIVE MODERATE TO POORLY DIFFERENTIATED ADENOCARCINOMA ?  ?03/22/2021 Initial Diagnosis  ? Adenocarcinoma of duodenum [facility]) ?  ?04/01/2021 Imaging  ? EXAM: ?CT CHEST, ABDOMEN, AND PELVIS WITH CONTRAST ? ?IMPRESSION: ?1. Circumferential  wall thickening of the first portion of the ?duodenum, in keeping with known primary malignancy. ?2.  Enlarged, hypodense lymph nodes anterior to the pancreatic head and in the portacaval station, concerning for nodal metastatic ?disease. ?3. No other evidence of metastatic disease in the chest, abdomen, or pelvis. ?4. Incidental note of a fluid attenuation lesion within the proximal ?pancreatic body measuring 1.9 x 1.4 cm, with prominence of the ?pancreatic duct distally, up to 0.6 cm. This lesion was present on ?remote prior examination dated 05/04/2009, and has slightly ?increased in size over a long period of time. 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 ?5. Background of very fine centrilobular pulmonary nodules, most ?concentrated in the lung apices, most commonly seen in ?smoking-related respiratory bronchiolitis. ?6. Coronary artery disease. ?  ?Aortic Atherosclerosis (ICD10-I70.0). ?  ? Genetic Testing  ? 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 Of note, a variant of uncertain significance was detected in the ATM (p.A869G), BLM (c.800-3T>G), and SDHA (p.V632F) genes. Report date is 04/27/2021. ? ?The CancerNext-Expanded gene panel offered by [facility] and includes sequencing, rearrangement, and RNA analysis for the following 77 genes: AIP, ALK, APC, ATM, AXIN2, BAP1, BARD1, BLM, BMPR1A, BRCA1, BRCA2, BRIP1, CDC73, CDH1, CDK4, CDKN1B, CDKN2A, CHEK2, CTNNA1, DICER1, FANCC, FH, FLCN, GALNT12, KIF1B, LZTR1, MAX, MEN1, MET, MLH1, MSH2, MSH3, MSH6, MUTYH, NBN, NF1, NF2, NTHL1, PALB2, PHOX2B, PMS2, POT1, PRKAR1A, PTCH1, PTEN, RAD51C, RAD51D, RB1, RECQL, RET, SDHA, SDHAF2, SDHB, SDHC, SDHD, SMAD4, SMARCA4, SMARCB1, SMARCE1, STK11, SUFU, TMEM127, TP53, TSC1, TSC2, VHL and XRCC2 (sequencing and deletion/duplication); EGFR, EGLN1, HOXB13, KIT, MITF, PDGFRA, POLD1, and POLE (sequencing only); EPCAM and GREM1 (deletion/duplication only).  ?  ?05/11/2021 -   Chemotherapy  ? Patient is on Treatment Plan : COLORECTAL Pembrolizumab (200) q21d  ?   ? ? ? ?INTERVAL HISTORY:  ?REESE STOCKMAN is here for a follow up of duodenal cancer. He was last seen by me on 05/03/21. He presents to the clinic accompanied by his daughter. ?He reports he tolerated first Keytruda well. ?  ?All other systems were reviewed with the patient and are negative. ? ?MEDICAL HISTORY:  ?Past Medical History:  ?Diagnosis Date  ? Carotid stenosis, left   ? Colon cancer ([place]) 2000  ? Coronary artery disease   ? per Dr. Jacalyn Lefevre note from 04/13/21  ? Diverticulosis of colon   ? GERD (gastroesophageal reflux disease)   ? Glaucoma   ? History of chemotherapy 2000  ? colon cancer  ? History of radiation therapy 08/06/13- 10/03/13  ? prostate 7800 cGy 40 sessions, seminal vesicles 5600 cGy 40 sessions  ? Hyperlipidemia   ? Hypertension   ? Left knee DJD   ? Prostate cancer ([place]) 05/23/2013  ? gleason 3+4=7, volume 11.5 ml  ? TIA (transient ischemic attack)   ? ? ?SURGICAL HISTORY: ?Past Surgical History:  ?Procedure Laterality Date  ? COLON SURGERY  2000  ? colon cancer  ? COLONOSCOPY    ? ENDARTERECTOMY Left 04/25/2021  ? Procedure: LEFT ENDARTERECTOMY CAROTID;  Surgeon: Marty Heck, MD;  Location: [facility];  Service: Vascular;  Laterality: Left;  ? EUS    ? pancreatic cyst  ? KNEE ARTHROSCOPY  2007  ? LT- [facility]  ? PATCH ANGIOPLASTY Left 04/25/2021  ? Procedure: PATCH ANGIOPLASTY XENOSURE 1CM X 6CM;  Surgeon: Marty Heck, MD;  Location: [facility];  Service: Vascular;  Laterality: Left;  ? PROSTATE BIOPSY  05/23/13  ? gleason 7, volume 11.5 ml  ? ? ?I have reviewed the social history and family history with the patient and they are unchanged from previous note. ? ?ALLERGIES:  is allergic to lyrica [pregabalin]. ? ?MEDICATIONS:  ?Current Outpatient Medications  ?Medication Sig Dispense Refill  ? potassium chloride (KLOR-CON M) 10 MEQ tablet Take 1 tablet (10 mEq total) by mouth 2 (two) times daily. 14  tablet 0  ? amLODipine (NORVASC) 10 MG tablet TAKE 1 TABLET BY MOUTH ONCE DAILY 90 tablet 3  ? aspirin EC 81 MG EC tablet Take 1 tablet (81 mg total) by mouth daily. Swallow whole. 30 tablet 11  ? atorvastatin (LIPITOR)

## 2021-06-03 ENCOUNTER — Telehealth: Payer: Self-pay | Admitting: Hematology

## 2021-06-03 NOTE — Telephone Encounter (Signed)
Left message with follow-up appointment per 3/16 los. ?

## 2021-06-23 ENCOUNTER — Other Ambulatory Visit: Payer: Self-pay

## 2021-06-23 ENCOUNTER — Inpatient Hospital Stay: Payer: Medicare Other | Attending: Nurse Practitioner

## 2021-06-23 ENCOUNTER — Inpatient Hospital Stay: Payer: Medicare Other

## 2021-06-23 ENCOUNTER — Encounter: Payer: Self-pay | Admitting: Hematology

## 2021-06-23 ENCOUNTER — Inpatient Hospital Stay (HOSPITAL_BASED_OUTPATIENT_CLINIC_OR_DEPARTMENT_OTHER): Payer: Medicare Other | Admitting: Hematology

## 2021-06-23 VITALS — BP 162/54 | HR 87 | Temp 98.2°F | Resp 18 | Ht 66.0 in | Wt 221.9 lb

## 2021-06-23 VITALS — BP 147/52 | HR 65 | Temp 98.2°F | Resp 17

## 2021-06-23 DIAGNOSIS — Z79899 Other long term (current) drug therapy: Secondary | ICD-10-CM | POA: Diagnosis not present

## 2021-06-23 DIAGNOSIS — Z7982 Long term (current) use of aspirin: Secondary | ICD-10-CM | POA: Diagnosis not present

## 2021-06-23 DIAGNOSIS — Z1509 Genetic susceptibility to other malignant neoplasm: Secondary | ICD-10-CM | POA: Diagnosis not present

## 2021-06-23 DIAGNOSIS — Z8673 Personal history of transient ischemic attack (TIA), and cerebral infarction without residual deficits: Secondary | ICD-10-CM | POA: Insufficient documentation

## 2021-06-23 DIAGNOSIS — Z5112 Encounter for antineoplastic immunotherapy: Secondary | ICD-10-CM | POA: Diagnosis present

## 2021-06-23 DIAGNOSIS — C170 Malignant neoplasm of duodenum: Secondary | ICD-10-CM

## 2021-06-23 DIAGNOSIS — Z8546 Personal history of malignant neoplasm of prostate: Secondary | ICD-10-CM | POA: Diagnosis not present

## 2021-06-23 DIAGNOSIS — Z9221 Personal history of antineoplastic chemotherapy: Secondary | ICD-10-CM | POA: Insufficient documentation

## 2021-06-23 DIAGNOSIS — Z923 Personal history of irradiation: Secondary | ICD-10-CM | POA: Insufficient documentation

## 2021-06-23 DIAGNOSIS — Z7902 Long term (current) use of antithrombotics/antiplatelets: Secondary | ICD-10-CM | POA: Insufficient documentation

## 2021-06-23 DIAGNOSIS — E876 Hypokalemia: Secondary | ICD-10-CM | POA: Diagnosis not present

## 2021-06-23 DIAGNOSIS — D500 Iron deficiency anemia secondary to blood loss (chronic): Secondary | ICD-10-CM

## 2021-06-23 DIAGNOSIS — D508 Other iron deficiency anemias: Secondary | ICD-10-CM | POA: Diagnosis present

## 2021-06-23 LAB — CBC WITH DIFFERENTIAL (CANCER CENTER ONLY)
Abs Immature Granulocytes: 0.02 10*3/uL (ref 0.00–0.07)
Basophils Absolute: 0 10*3/uL (ref 0.0–0.1)
Basophils Relative: 0 %
Eosinophils Absolute: 0.3 10*3/uL (ref 0.0–0.5)
Eosinophils Relative: 4 %
HCT: 30.8 % — ABNORMAL LOW (ref 39.0–52.0)
Hemoglobin: 9.3 g/dL — ABNORMAL LOW (ref 13.0–17.0)
Immature Granulocytes: 0 %
Lymphocytes Relative: 16 %
Lymphs Abs: 1.1 10*3/uL (ref 0.7–4.0)
MCH: 24.7 pg — ABNORMAL LOW (ref 26.0–34.0)
MCHC: 30.2 g/dL (ref 30.0–36.0)
MCV: 81.7 fL (ref 80.0–100.0)
Monocytes Absolute: 0.4 10*3/uL (ref 0.1–1.0)
Monocytes Relative: 5 %
Neutro Abs: 5.2 10*3/uL (ref 1.7–7.7)
Neutrophils Relative %: 75 %
Platelet Count: 256 10*3/uL (ref 150–400)
RBC: 3.77 MIL/uL — ABNORMAL LOW (ref 4.22–5.81)
RDW: 19 % — ABNORMAL HIGH (ref 11.5–15.5)
WBC Count: 7 10*3/uL (ref 4.0–10.5)
nRBC: 0 % (ref 0.0–0.2)

## 2021-06-23 LAB — CMP (CANCER CENTER ONLY)
ALT: 9 U/L (ref 0–44)
AST: 11 U/L — ABNORMAL LOW (ref 15–41)
Albumin: 3.2 g/dL — ABNORMAL LOW (ref 3.5–5.0)
Alkaline Phosphatase: 101 U/L (ref 38–126)
Anion gap: 6 (ref 5–15)
BUN: 17 mg/dL (ref 8–23)
CO2: 29 mmol/L (ref 22–32)
Calcium: 8.6 mg/dL — ABNORMAL LOW (ref 8.9–10.3)
Chloride: 110 mmol/L (ref 98–111)
Creatinine: 1.02 mg/dL (ref 0.61–1.24)
GFR, Estimated: >60 mL/min (ref >60)
Glucose, Bld: 138 mg/dL — ABNORMAL HIGH (ref 70–99)
Potassium: 3.3 mmol/L — ABNORMAL LOW (ref 3.5–5.1)
Sodium: 145 mmol/L (ref 135–145)
Total Bilirubin: 0.7 mg/dL (ref 0.3–1.2)
Total Protein: 6.3 g/dL — ABNORMAL LOW (ref 6.5–8.1)

## 2021-06-23 MED ORDER — SODIUM CHLORIDE 0.9 % IV SOLN
200.0000 mg | Freq: Once | INTRAVENOUS | Status: AC
  Administered 2021-06-23: 200 mg via INTRAVENOUS
  Filled 2021-06-23: qty 200

## 2021-06-23 MED ORDER — SODIUM CHLORIDE 0.9 % IV SOLN
300.0000 mg | Freq: Once | INTRAVENOUS | Status: AC
  Administered 2021-06-23: 300 mg via INTRAVENOUS
  Filled 2021-06-23: qty 300

## 2021-06-23 MED ORDER — SODIUM CHLORIDE 0.9 % IV SOLN: Freq: Once | INTRAVENOUS | Status: AC

## 2021-06-23 MED ORDER — POTASSIUM CHLORIDE CRYS ER 10 MEQ PO TBCR: 10.0000 meq | EXTENDED_RELEASE_TABLET | Freq: Every day | ORAL | 1 refills | Status: DC

## 2021-06-23 NOTE — Progress Notes (Signed)
?[place]   ?Telephone:(336) [phone] [phone]) [phone]   ?Clinic Follow up Note  ? ?Patient Care Team: ?Biagio Borg, MD as PCP - General ?[name], MD as Consulting Physician (Gastroenterology) ?[name], MD as Consulting Physician (Orthopedic Surgery) ?[name], MD as Consulting Physician (Ophthalmology) ?[name], MD as Consulting Physician (Oncology) ?[name], [name], RN as Sales executive (Oncology) ? ?Date of Service:  06/23/2021 ? ?CHIEF COMPLAINT: f/u of duodenal cancer ? ?CURRENT THERAPY:  ?[name], started 05/11/21 ? ?ASSESSMENT & PLAN:  ?Aaron Moss is a 76 y.o. male with  ? ?1. 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  Although the scope could not pass patient has no signs of obstruction.  Biopsy confirmed moderate-poorly differentiated adenocarcinoma ?-CT CAP on 04/01/21 showed multiple enlarged lymph nodes around the pancreatic head and portacaval station, no other distant metastasis. ?-He was seen by Dr. Barry Dienes, offered a Whipple surgery.  Unfortunately he had ischemic stroke recently, although he recovered well, he cannot have surgery until 3 months later. 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 I recommend him to start while he is waiting for surgery.  ?-he began [name] on 05/11/21. He tolerated well with mild fatigue. ?-will repeat CT after 4 cycles to evaluate his response to treatment  ?-Lab reviewed, adequate for treatment, will proceed to cycle 3 today ?  ?2. Lynch Syndrome, H/o prostate (2015) and colon (2000) cancers ?-he has a personal history of stage pT3N0 colon cancer in 2000 and prostate cancer dx  in 2015, s/p 8 weeks of IMRT with Dr. Valere Dross. ?-genetic testing and counseling on 04/07/21 confirmed a PMS2 mutation, as well as VUS in ATM, BLM, and SDHA. ?  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 ?-he received three doses IV Venofer 2/22-05/25/21. We will give him one more today as scheduled. ?  ?4. Recent TIA, s/p left endarterectomy carotid on 04/25/21 ?-On aspirin and Plavix ?-no residual neurodeficits ? ?5. Hypokalemia  ?-he has chronic low potassium. I will call in more KCL. ?  ?  ?Plan ?-proceed with third Keytruda and IV Venofer today ?-I called in KCL ?-lab, f/u, and Keytruda in 3 weeks, plan to repeat CT scan after next treatment  ? ? ?No problem-specific Assessment & Plan notes found for this encounter. ? ? ?SUMMARY OF ONCOLOGIC HISTORY: ?Oncology History  ?Adenocarcinoma of duodenum ([place])  ?03/01/2021 Imaging  ? EXAM: ?ABDOMEN ULTRASOUND COMPLETE ? ?IMPRESSION: ?1. Simple appearing right renal cyst. ?2. Otherwise negative abdominal ultrasound ?  ?03/18/2021 Procedure  ? Upper Endoscopy/Colonoscopy, Dr. Loletha Carrow ? ?Upper Endoscopy Impression: ?- Normal esophagus. ?- A single submucosal papule (nodule) found in the stomach. ?- Duodenal mass. Biopsied. Concerning for malignancy. ? ?Colonoscopy Impression: ?- Diverticulosis in the entire examined colon. ?- The examined portion of the ileum was normal. ?- Patent end-to-end colo-colonic anastomosis, characterized by healthy appearing mucosa. ?- Internal hemorrhoids. ?- No specimens collected. ?  ?03/18/2021 Initial Biopsy  ? Diagnosis ?Duodenum, Biopsy ?- INVASIVE MODERATE TO POORLY DIFFERENTIATED ADENOCARCINOMA ?  ?  03/18/2021 Cancer Staging  ? Staging form: Small Intestine - Adenocarcinoma, AJCC 8th Edition ?- Clinical stage from 03/18/2021: Stage IIIA (cTX, cN1, cM0) - Signed by  [name], MD on 06/22/2021 ?Stage prefix: Initial diagnosis ? ?  ?03/22/2021 Initial Diagnosis  ? Adenocarcinoma of duodenum [facility]) ?  ?04/01/2021 Imaging  ? EXAM: ?CT CHEST, ABDOMEN, AND PELVIS WITH CONTRAST ? ?IMPRESSION: ?1. Circumferential wall thickening of the first portion of the ?duodenum, in keeping with known primary malignancy. ?2. Enlarged, hypodense lymph nodes anterior to the pancreatic head and in the portacaval station, concerning for nodal metastatic ?disease. ?3. No other evidence of metastatic disease in the chest, abdomen, or pelvis. ?4. Incidental note of a fluid attenuation lesion within the proximal ?pancreatic body measuring 1.9 x 1.4 cm, with prominence of the ?pancreatic duct distally, up to 0.6 cm. This lesion was present on ?remote prior examination dated 05/04/2009, and has slightly ?increased in size over a long period of time. 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 ?5. Background of very fine centrilobular pulmonary nodules, most ?concentrated in the lung apices, most commonly seen in ?smoking-related respiratory bronchiolitis. ?6. Coronary artery disease. ?  ?Aortic Atherosclerosis (ICD10-I70.0). ?  ? Genetic Testing  ? 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 Of note, a variant of uncertain significance was detected in the ATM (p.A869G), BLM (c.800-3T>G), and SDHA (p.V632F) genes. Report date is 04/27/2021. ? ?The CancerNext-Expanded gene panel offered by [facility] and includes sequencing, rearrangement, and RNA analysis for the following 77 genes: AIP, ALK, APC, ATM, AXIN2, BAP1, BARD1, BLM, BMPR1A, BRCA1, BRCA2, BRIP1, CDC73, CDH1, CDK4, CDKN1B, CDKN2A, CHEK2, CTNNA1, DICER1, FANCC, FH, FLCN, GALNT12, KIF1B, LZTR1, MAX, MEN1, MET, MLH1, MSH2, MSH3, MSH6, MUTYH, NBN, NF1, NF2, NTHL1, PALB2, PHOX2B, PMS2, POT1, PRKAR1A, PTCH1, PTEN,  RAD51C, RAD51D, RB1, RECQL, RET, SDHA, SDHAF2, SDHB, SDHC, SDHD, SMAD4, SMARCA4, SMARCB1, SMARCE1, STK11, SUFU, TMEM127, TP53, TSC1, TSC2, VHL and XRCC2 (sequencing and deletion/duplication); EGFR, EGLN1, HOXB13, KIT, MITF, PDGFRA, POLD1, and POLE (sequencing only); EPCAM and GREM1 (deletion/duplication only).  ?  ?05/11/2021 -  Chemotherapy  ? Patient is on Treatment Plan : COLORECTAL Pembrolizumab (200) q21d  ?   ? ? ? ?INTERVAL HISTORY:  ?JEAN SKOW is here for a follow up of duodenal cancer. He was last seen by me on 06/02/21. He presents to the clinic alone. ?He reports he is doing well overall. He denies any side effects from infusion. He reports his appetite is about the same. He notes his energy is about the same but adds he is slow to get going in the morning. ?He notes he has issues with his knee and wonders if he can receive cortisone shots. ?  ?All other systems were reviewed with the patient and are negative. ? ?MEDICAL HISTORY:  ?Past Medical History:  ?Diagnosis Date  ? Carotid stenosis, left   ? Colon cancer ([place]) 2000  ? Coronary artery disease   ? per Dr. Jacalyn Lefevre note from 04/13/21  ? Diverticulosis of colon   ? GERD (gastroesophageal reflux disease)   ? Glaucoma   ? History of chemotherapy 2000  ? colon cancer  ? History of radiation therapy 08/06/13- 10/03/13  ? prostate 7800 cGy 40 sessions, seminal vesicles 5600 cGy 40 sessions  ? Hyperlipidemia   ? Hypertension   ? Left knee DJD   ? Prostate cancer ([place]) 05/23/2013  ?  gleason 3+4=7, volume 11.5 ml  ? TIA (transient ischemic attack)   ? ? ?SURGICAL HISTORY: ?Past Surgical History:  ?Procedure Laterality Date  ? COLON SURGERY  2000  ? colon cancer  ? COLONOSCOPY    ? ENDARTERECTOMY Left 04/25/2021  ? Procedure: LEFT ENDARTERECTOMY CAROTID;  Surgeon: Marty Heck, MD;  Location: [facility];  Service: Vascular;  Laterality: Left;  ? EUS    ? pancreatic cyst  ? KNEE ARTHROSCOPY  2007  ? LT- [facility]  ? PATCH ANGIOPLASTY Left 04/25/2021  ?  Procedure: PATCH ANGIOPLASTY XENOSURE 1CM X 6CM;  Surgeon: Marty Heck, MD;  Location[facility];  Service: Vascular;  Laterality: Left;  ? PROSTATE BIOPSY  05/23/13  ? gleason 7, volume 11.5 ml  ? ? ?I have

## 2021-06-23 NOTE — Patient Instructions (Signed)
[facility]  Discharge Instructions: ?Thank you for choosing [facility] to provide your oncology and hematology care.  ? ?If you have a lab appointment with the [facility], please go directly to the [facility] and check in at the registration area. ?  ?Wear comfortable clothing and clothing appropriate for easy access to any Portacath or PICC line.  ? ?We strive to give you quality time with your provider. You may need to reschedule your appointment if you arrive late (15 or more minutes).  Arriving late affects you and other patients whose appointments are after yours.  Also, if you miss three or more appointments without notifying the office, you may be dismissed from the clinic at the provider?s discretion.    ?  ?For prescription refill requests, have your pharmacy contact our office and allow 72 hours for refills to be completed.   ? ?Today you received the following chemotherapy and/or immunotherapy agents: Keytruda    ?  ?To help prevent nausea and vomiting after your treatment, we encourage you to take your nausea medication as directed. ? ?BELOW ARE SYMPTOMS THAT SHOULD BE REPORTED IMMEDIATELY: ?*FEVER GREATER THAN 100.4 F (38 ?C) OR HIGHER ?*CHILLS OR SWEATING ?*NAUSEA AND VOMITING THAT IS NOT CONTROLLED WITH YOUR NAUSEA MEDICATION ?*UNUSUAL SHORTNESS OF BREATH ?*UNUSUAL BRUISING OR BLEEDING ?*URINARY PROBLEMS (pain or burning when urinating, or frequent urination) ?*BOWEL PROBLEMS (unusual diarrhea, constipation, pain near the anus) ?TENDERNESS IN MOUTH AND THROAT WITH OR WITHOUT PRESENCE OF ULCERS (sore throat, sores in mouth, or a toothache) ?UNUSUAL RASH, SWELLING OR PAIN  ?UNUSUAL VAGINAL DISCHARGE OR ITCHING  ? ?Items with * indicate a potential emergency and should be followed up as soon as possible or go to the Emergency Department if any problems should occur. ? ?Please show the CHEMOTHERAPY ALERT CARD or IMMUNOTHERAPY ALERT CARD at check-in to  the Emergency Department and triage nurse. ? ?Should you have questions after your visit or need to cancel or reschedule your appointment, please contact [facility]  Dept: [phone]  and follow the prompts.  Office hours are 8:00 a.m. to 4:30 p.m. Monday - Friday. Please note that voicemails left after 4:00 p.m. may not be returned until the following business day.  We are closed weekends and major holidays. You have access to a nurse at all times for urgent questions. Please call the main number to the clinic Dept: [phone] and follow the prompts. ? ? ?For any non-urgent questions, you may also contact your provider using MyChart. We now offer e-Visits for anyone 63 and older to request care online for non-urgent symptoms. For details visit [email or web address]. ?  ?Also download the MyChart app! Go to the app store, search "MyChart", open the app, select [facility], and log in with your MyChart username and password. ? ?Due to Covid, a mask is required upon entering the hospital/clinic. If you do not have a mask, one will be given to you upon arrival. For doctor visits, patients may have 1 support person aged 55 or older with them. For treatment visits, patients cannot have anyone with them due to current Covid guidelines and our immunocompromised population.  ? ?Iron Sucrose Injection ?What is this medication? ?IRON SUCROSE (EYE ern SOO krose) treats low levels of iron (iron deficiency anemia) in people with kidney disease. Iron is a mineral that plays an important role in making red blood cells, which carry oxygen from your lungs to the rest  of your body. ?This medicine may be used for other purposes; ask your health care provider or pharmacist if you have questions. ?COMMON BRAND NAME(S): Venofer ?What should I tell my care team before I take this medication? ?They need to know if you have any of these conditions: ?Anemia not caused by low iron levels ?Heart  disease ?High levels of iron in the blood ?Kidney disease ?Liver disease ?An unusual or allergic reaction to iron, other medications, foods, dyes, or preservatives ?Pregnant or trying to get pregnant ?Breast-feeding ?How should I use this medication? ?This medication is for infusion into a vein. It is given in a hospital or clinic setting. ?Talk to your care team about the use of this medication in children. While this medication may be prescribed for children as young as 2 years for selected conditions, precautions do apply. ?Overdosage: If you think you have taken too much of this medicine contact a poison control center or emergency room at once. ?NOTE: This medicine is only for you. Do not share this medicine with others. ?What if I miss a dose? ?It is important not to miss your dose. Call your care team if you are unable to keep an appointment. ?What may interact with this medication? ?Do not take this medication with any of the following: ?Deferoxamine ?Dimercaprol ?Other iron products ?This medication may also interact with the following: ?Chloramphenicol ?Deferasirox ?This list may not describe all possible interactions. Give your health care provider a list of all the medicines, herbs, non-prescription drugs, or dietary supplements you use. Also tell them if you smoke, drink alcohol, or use illegal drugs. Some items may interact with your medicine. ?What should I watch for while using this medication? ?Visit your care team regularly. Tell your care team if your symptoms do not start to get better or if they get worse. You may need blood work done while you are taking this medication. ?You may need to follow a special diet. Talk to your care team. Foods that contain iron include: whole grains/cereals, dried fruits, beans, or peas, leafy green vegetables, and organ meats (liver, kidney). ?What side effects may I notice from receiving this medication? ?Side effects that you should report to your care team as  soon as possible: ?Allergic reactions--skin rash, itching, hives, swelling of the face, lips, tongue, or throat ?Low blood pressure--dizziness, feeling faint or lightheaded, blurry vision ?Shortness of breath ?Side effects that usually do not require medical attention (report to your care team if they continue or are bothersome): ?Flushing ?Headache ?Joint pain ?Muscle pain ?Nausea ?Pain, redness, or irritation at injection site ?This list may not describe all possible side effects. Call your doctor for medical advice about side effects. You may report side effects to FDA at 1-800-FDA-1088. ?Where should I keep my medication? ?This medication is given in a hospital or clinic and will not be stored at home. ?NOTE: This sheet is a summary. It may not cover all possible information. If you have questions about this medicine, talk to your doctor, pharmacist, or health care provider. ?? 2022 Elsevier/Gold Standard (2020-07-30 00:00:00) ? ? ?

## 2021-06-23 NOTE — Progress Notes (Signed)
Pt declined to stay for 30 min post obs, VSS, ambulatory to lobby ?

## 2021-07-14 ENCOUNTER — Other Ambulatory Visit: Payer: Self-pay | Admitting: Hematology

## 2021-07-14 ENCOUNTER — Encounter: Payer: Self-pay | Admitting: Hematology

## 2021-07-14 ENCOUNTER — Inpatient Hospital Stay: Payer: Medicare Other

## 2021-07-14 ENCOUNTER — Other Ambulatory Visit: Payer: Self-pay

## 2021-07-14 ENCOUNTER — Inpatient Hospital Stay (HOSPITAL_BASED_OUTPATIENT_CLINIC_OR_DEPARTMENT_OTHER): Payer: Medicare Other | Admitting: Hematology

## 2021-07-14 ENCOUNTER — Ambulatory Visit: Payer: Medicare Other | Admitting: Nurse Practitioner

## 2021-07-14 VITALS — BP 186/70 | HR 76 | Temp 98.1°F | Resp 18 | Wt 220.6 lb

## 2021-07-14 VITALS — BP 164/62

## 2021-07-14 DIAGNOSIS — C170 Malignant neoplasm of duodenum: Secondary | ICD-10-CM

## 2021-07-14 DIAGNOSIS — Z5112 Encounter for antineoplastic immunotherapy: Secondary | ICD-10-CM | POA: Diagnosis not present

## 2021-07-14 LAB — CBC WITH DIFFERENTIAL (CANCER CENTER ONLY)
Abs Immature Granulocytes: 0.02 10*3/uL (ref 0.00–0.07)
Basophils Absolute: 0 10*3/uL (ref 0.0–0.1)
Basophils Relative: 0 %
Eosinophils Absolute: 0.3 10*3/uL (ref 0.0–0.5)
Eosinophils Relative: 3 %
HCT: 34.1 % — ABNORMAL LOW (ref 39.0–52.0)
Hemoglobin: 10.3 g/dL — ABNORMAL LOW (ref 13.0–17.0)
Immature Granulocytes: 0 %
Lymphocytes Relative: 21 %
Lymphs Abs: 1.6 10*3/uL (ref 0.7–4.0)
MCH: 24.3 pg — ABNORMAL LOW (ref 26.0–34.0)
MCHC: 30.2 g/dL (ref 30.0–36.0)
MCV: 80.4 fL (ref 80.0–100.0)
Monocytes Absolute: 0.5 10*3/uL (ref 0.1–1.0)
Monocytes Relative: 6 %
Neutro Abs: 5.2 10*3/uL (ref 1.7–7.7)
Neutrophils Relative %: 70 %
Platelet Count: 236 10*3/uL (ref 150–400)
RBC: 4.24 MIL/uL (ref 4.22–5.81)
RDW: 18 % — ABNORMAL HIGH (ref 11.5–15.5)
WBC Count: 7.6 10*3/uL (ref 4.0–10.5)
nRBC: 0 % (ref 0.0–0.2)

## 2021-07-14 LAB — CMP (CANCER CENTER ONLY)
ALT: 9 U/L (ref 0–44)
AST: 10 U/L — ABNORMAL LOW (ref 15–41)
Albumin: 3.4 g/dL — ABNORMAL LOW (ref 3.5–5.0)
Alkaline Phosphatase: 106 U/L (ref 38–126)
Anion gap: 4 — ABNORMAL LOW (ref 5–15)
BUN: 15 mg/dL (ref 8–23)
CO2: 29 mmol/L (ref 22–32)
Calcium: 8.9 mg/dL (ref 8.9–10.3)
Chloride: 108 mmol/L (ref 98–111)
Creatinine: 1.08 mg/dL (ref 0.61–1.24)
GFR, Estimated: >60 mL/min (ref >60)
Glucose, Bld: 91 mg/dL (ref 70–99)
Potassium: 3.7 mmol/L (ref 3.5–5.1)
Sodium: 141 mmol/L (ref 135–145)
Total Bilirubin: 0.6 mg/dL (ref 0.3–1.2)
Total Protein: 6.6 g/dL (ref 6.5–8.1)

## 2021-07-14 LAB — IRON AND IRON BINDING CAPACITY (CC-WL,HP ONLY)
Iron: 23 ug/dL — ABNORMAL LOW (ref 45–182)
Saturation Ratios: 9 % — ABNORMAL LOW (ref 17.9–39.5)
TIBC: 258 ug/dL (ref 250–450)
UIBC: 235 ug/dL (ref 117–376)

## 2021-07-14 LAB — FERRITIN: Ferritin: 33 ng/mL (ref 24–336)

## 2021-07-14 MED ORDER — SODIUM CHLORIDE 0.9 % IV SOLN: Freq: Once | INTRAVENOUS | Status: AC

## 2021-07-14 MED ORDER — SODIUM CHLORIDE 0.9 % IV SOLN
200.0000 mg | Freq: Once | INTRAVENOUS | Status: AC
  Administered 2021-07-14: 200 mg via INTRAVENOUS
  Filled 2021-07-14: qty 200

## 2021-07-14 NOTE — Progress Notes (Signed)
?[facility]   ?[phone] [phone]   ?Clinic Follow up Note  ? ?Patient Care Team: ?John, James W, MD as PCP - General ?Gessner, Carl E, MD as Consulting Physician (Gastroenterology) ?Rowan, Frank, MD as Consulting Physician (Orthopedic Surgery) ?Groat, Christopher, MD as Consulting Physician (Ophthalmology) ?Feng, Yan, MD as Consulting Physician (Oncology) ?Beach, Karen A, RN as Oncology Nurse Navigator (Oncology) ? ?Date of Service:  07/14/2021 ? ?CHIEF COMPLAINT: f/u of duodenal cancer ? ?CURRENT THERAPY:  ?Keytruda, q3weeks, started 05/11/21 ? ?ASSESSMENT & PLAN:  ?Aaron Moss is a 75 y.o. male with  ? ?1. 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  Although the scope could not pass patient has no signs of obstruction.  Biopsy confirmed moderate-poorly differentiated adenocarcinoma ?-CT CAP on 04/01/21 showed multiple enlarged lymph nodes around the pancreatic head and portacaval station, no other distant metastasis. ?-He was seen by Dr. Byerly, offered a Whipple surgery.  Unfortunately he had ischemic stroke recently, although he recovered well, he cannot have surgery until 3 months later. 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 I recommend him to start while he is waiting for surgery.  ?-he began Keytruda on 05/11/21. He has tolerated well with mild fatigue. Lab reviewed, adequate for treatment, will proceed to cycle 4 today. Plan for repeat CT after this cycle. ?-after his CT, I will discuss his case at our multidisciplinary tumor conference to determine if surgery is recommended at this point, vs continue Keytruda and evaluate response with  EGD.  We discussed the small possibility of complete response from immunotherapy, and avoid surgery.  Patient is open to either surgery or continue Keytruda ?  ?2. Lynch Syndrome, H/o prostate (2015) and colon (2000) cancers ?-he has a personal history of stage pT3N0 colon cancer in 2000 and prostate cancer dx in 2015, s/p 8 weeks of IMRT with Dr. Murray. ?-genetic testing and counseling on 04/07/21 confirmed a PMS2 mutation, as well as VUS in ATM, BLM, and SDHA. ?  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 ?-he received three doses IV Venofer 2/22-05/25/21 and one on 06/23/21. ?-he is on oral iron. ?-hgb improved to 10.3 today (07/14/21) ?  ?4. Recent TIA, s/p left endarterectomy carotid on 04/25/21 ?-On aspirin and Plavix ?-no residual neurodeficits ?  ?5. Hypokalemia  ?-he has chronic low potassium. I will call in more KCL. ? ?6. HTN ?-followed by Dr. Crenshaw ?-his BP is 186/70 today. He states it is lower at home. I advised him to reach out to cardiology to discuss his dose if it remains elevated. ?  ?  ?Plan ?-proceed with fourth Keytruda ?-f/u and Keytruda in 3 weeks, with lab and repeat CT scan several days before ?-We will review his case in our tumor board after CT scan to discuss surgery versus continue Keytruda ? ? ?No problem-specific Assessment & Plan notes found for this encounter. ? ? ?SUMMARY OF ONCOLOGIC HISTORY: ?Oncology History  ?Adenocarcinoma of duodenum (HCC)  ?03/01/2021 Imaging  ? EXAM: ?ABDOMEN ULTRASOUND COMPLETE ? ?IMPRESSION: ?  1. Simple appearing right renal cyst. ?2. Otherwise negative abdominal ultrasound ?  ?03/18/2021 Procedure  ? Upper Endoscopy/Colonoscopy, Dr. Danis ? ?Upper Endoscopy Impression: ?- Normal esophagus. ?- A single submucosal papule (nodule) found in the stomach. ?- Duodenal mass. Biopsied.  Concerning for malignancy. ? ?Colonoscopy Impression: ?- Diverticulosis in the entire examined colon. ?- The examined portion of the ileum was normal. ?- Patent end-to-end colo-colonic anastomosis, characterized by healthy appearing mucosa. ?- Internal hemorrhoids. ?- No specimens collected. ?  ?03/18/2021 Initial Biopsy  ? Diagnosis ?Duodenum, Biopsy ?- INVASIVE MODERATE TO POORLY DIFFERENTIATED ADENOCARCINOMA ?  ?03/18/2021 Cancer Staging  ? Staging form: Small Intestine - Adenocarcinoma, AJCC 8th Edition ?- Clinical stage from 03/18/2021: Stage IIIA (cTX, cN1, cM0) - Signed by Feng, Yan, MD on 06/22/2021 ?Stage prefix: Initial diagnosis ? ?  ?03/22/2021 Initial Diagnosis  ? Adenocarcinoma of duodenum (HCC) ?  ?04/01/2021 Imaging  ? EXAM: ?CT CHEST, ABDOMEN, AND PELVIS WITH CONTRAST ? ?IMPRESSION: ?1. Circumferential wall thickening of the first portion of the ?duodenum, in keeping with known primary malignancy. ?2. Enlarged, hypodense lymph nodes anterior to the pancreatic head and in the portacaval station, concerning for nodal metastatic ?disease. ?3. No other evidence of metastatic disease in the chest, abdomen, or pelvis. ?4. Incidental note of a fluid attenuation lesion within the proximal ?pancreatic body measuring 1.9 x 1.4 cm, with prominence of the ?pancreatic duct distally, up to 0.6 cm. This lesion was present on ?remote prior examination dated 05/04/2009, and has slightly ?increased in size over a long period of time. 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 ?5. Background of very fine centrilobular pulmonary nodules, most ?concentrated in the lung apices, most commonly seen in ?smoking-related respiratory bronchiolitis. ?6. Coronary artery disease. ?  ?Aortic Atherosclerosis (ICD10-I70.0). ?  ? Genetic Testing  ? 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 Of  note, a variant of uncertain significance was detected in the ATM (p.A869G), BLM (c.800-3T>G), and SDHA (p.V632F) genes. Report date is 04/27/2021. ? ?The CancerNext-Expanded gene panel offered by [employer] and includes sequencing, rearrangement, and RNA analysis for the following 77 genes: AIP, ALK, APC, ATM, AXIN2, BAP1, BARD1, BLM, BMPR1A, BRCA1, BRCA2, BRIP1, CDC73, CDH1, CDK4, CDKN1B, CDKN2A, CHEK2, CTNNA1, DICER1, FANCC, FH, FLCN, GALNT12, KIF1B, LZTR1, MAX, MEN1, MET, MLH1, MSH2, MSH3, MSH6, MUTYH, NBN, NF1, NF2, NTHL1, PALB2, PHOX2B, PMS2, POT1, PRKAR1A, PTCH1, PTEN, RAD51C, RAD51D, RB1, RECQL, RET, SDHA, SDHAF2, SDHB, SDHC, SDHD, SMAD4, SMARCA4, SMARCB1, SMARCE1, STK11, SUFU, TMEM127, TP53, TSC1, TSC2, VHL and XRCC2 (sequencing and deletion/duplication); EGFR, EGLN1, HOXB13, KIT, MITF, PDGFRA, POLD1, and POLE (sequencing only); EPCAM and GREM1 (deletion/duplication only).  ?  ?05/11/2021 -  Chemotherapy  ? Patient is on Treatment Plan : COLORECTAL Pembrolizumab (200) q21d  ? ?  ?  ? ? ? ?INTERVAL HISTORY:  ?Aaron Moss is here for a follow up of duodenal cancer. He was last seen by me on 06/23/21. He presents to the clinic accompanied by his daughter. ?He reports he feels well overall. He reports occasional constipation. His daughter reports his energy has improved. ?  ?All other systems were reviewed with the patient and are negative. ? ?MEDICAL HISTORY:  ?Past Medical History:  ?Diagnosis Date  ? Carotid stenosis, left   ? Colon cancer (HCC) 2000  ? Coronary artery disease   ? per Dr. Crenshaw's note from 04/13/21  ? Diverticulosis   of colon   ? GERD (gastroesophageal reflux disease)   ? Glaucoma   ? History of chemotherapy 2000  ? colon cancer  ? History of radiation therapy 08/06/13- 10/03/13  ? prostate 7800 cGy 40 sessions, seminal vesicles 5600 cGy 40 sessions  ? Hyperlipidemia   ? Hypertension   ? Left knee DJD   ? Prostate cancer (HCC) 05/23/2013  ? gleason 3+4=7, volume 11.5 ml  ? TIA (transient  ischemic attack)   ? ? ?SURGICAL HISTORY: ?Past Surgical History:  ?Procedure Laterality Date  ? COLON SURGERY  2000  ? colon cancer  ? COLONOSCOPY    ? ENDARTERECTOMY Left 04/25/2021  ? Procedure: LEFT ENDA

## 2021-07-14 NOTE — Patient Instructions (Signed)
[facility] MEDICAL ONCOLOGY  Discharge Instructions: Thank you for choosing [facility] to provide your oncology and hematology care.   If you have a lab appointment with the Cancer Center, please go directly to the Cancer Center and check in at the registration area.   Wear comfortable clothing and clothing appropriate for easy access to any Portacath or PICC line.   We strive to give you quality time with your provider. You may need to reschedule your appointment if you arrive late (15 or more minutes).  Arriving late affects you and other patients whose appointments are after yours.  Also, if you miss three or more appointments without notifying the office, you may be dismissed from the clinic at the provider's discretion.      For prescription refill requests, have your pharmacy contact our office and allow 72 hours for refills to be completed.    Today you received the following chemotherapy and/or immunotherapy agents: keytruda      To help prevent nausea and vomiting after your treatment, we encourage you to take your nausea medication as directed.  BELOW ARE SYMPTOMS THAT SHOULD BE REPORTED IMMEDIATELY: *FEVER GREATER THAN 100.4 F (38 C) OR HIGHER *CHILLS OR SWEATING *NAUSEA AND VOMITING THAT IS NOT CONTROLLED WITH YOUR NAUSEA MEDICATION *UNUSUAL SHORTNESS OF BREATH *UNUSUAL BRUISING OR BLEEDING *URINARY PROBLEMS (pain or burning when urinating, or frequent urination) *BOWEL PROBLEMS (unusual diarrhea, constipation, pain near the anus) TENDERNESS IN MOUTH AND THROAT WITH OR WITHOUT PRESENCE OF ULCERS (sore throat, sores in mouth, or a toothache) UNUSUAL RASH, SWELLING OR PAIN  UNUSUAL VAGINAL DISCHARGE OR ITCHING   Items with * indicate a potential emergency and should be followed up as soon as possible or go to the Emergency Department if any problems should occur.  Please show the CHEMOTHERAPY ALERT CARD or IMMUNOTHERAPY ALERT CARD at check-in to  the Emergency Department and triage nurse.  Should you have questions after your visit or need to cancel or reschedule your appointment, please contact [facility]COL[facility]  Dept: [phone]  and follow the prompts.  Office hours are 8:00 a.m. to 4:30 p.m. Monday - Friday. Please note that voicemails left after 4:00 p.m. may not be returned until the following business day.  We are closed weekends and major holidays. You have access to a nurse at all times for urgent questions. Please call the main number to the clinic Dept: [phone] and follow the prompts.   For any non-urgent questions, you may also contact your provider using MyChart. We now offer e-Visits for anyone 18 and older to request care online for non-urgent symptoms. For details visit [email or web address].   Also download the MyChart app! Go to the app store, search "MyChart", open the app, select , and log in with your MyChart username and password.  Due to Covid, a mask is required upon entering the hospital/clinic. If you do not have a mask, one will be given to you upon arrival. For doctor visits, patients may have 1 support person aged 18 or older with them. For treatment visits, patients cannot have anyone with them due to current Covid guidelines and our immunocompromised population.   

## 2021-07-18 ENCOUNTER — Other Ambulatory Visit: Payer: Self-pay | Admitting: Hematology

## 2021-07-18 DIAGNOSIS — C170 Malignant neoplasm of duodenum: Secondary | ICD-10-CM

## 2021-07-20 ENCOUNTER — Other Ambulatory Visit: Payer: Self-pay | Admitting: Internal Medicine

## 2021-07-20 ENCOUNTER — Telehealth: Payer: Self-pay | Admitting: Hematology

## 2021-07-20 NOTE — Telephone Encounter (Signed)
Please refill as per office routine med refill policy (all routine meds to be refilled for 3 mo or monthly (per pt preference) up to one year from last visit, then month to month grace period for 3 mo, then further med refills will have to be denied) ? ?

## 2021-07-20 NOTE — Telephone Encounter (Signed)
Scheduled follow-up appointment per 4/27 los. Patient is aware. 

## 2021-07-26 ENCOUNTER — Ambulatory Visit (INDEPENDENT_AMBULATORY_CARE_PROVIDER_SITE_OTHER): Payer: Medicare Other | Admitting: Internal Medicine

## 2021-07-26 ENCOUNTER — Encounter: Payer: Self-pay | Admitting: Internal Medicine

## 2021-07-26 VITALS — BP 138/66 | HR 73 | Temp 99.0°F | Ht 66.0 in | Wt 223.0 lb

## 2021-07-26 DIAGNOSIS — R739 Hyperglycemia, unspecified: Secondary | ICD-10-CM

## 2021-07-26 DIAGNOSIS — I10 Essential (primary) hypertension: Secondary | ICD-10-CM

## 2021-07-26 DIAGNOSIS — E7800 Pure hypercholesterolemia, unspecified: Secondary | ICD-10-CM | POA: Diagnosis not present

## 2021-07-26 DIAGNOSIS — E559 Vitamin D deficiency, unspecified: Secondary | ICD-10-CM

## 2021-07-26 DIAGNOSIS — Z0001 Encounter for general adult medical examination with abnormal findings: Secondary | ICD-10-CM

## 2021-07-26 NOTE — Progress Notes (Signed)
Patient ID: Aaron Moss, male   DOB: 17-Jan-1946, 76 y.o.   MRN: [id number]         Chief Complaint:: wellness exam and low vit d, htn, venous insufficiency, hld, hyperglycemia       HPI:  Aaron Moss is a 76 y.o. male here for wellness exam; declines covid booster, shingrix, o/w up to date                        Also not taking Vit D.  Pt denies chest pain, increased sob or doe, wheezing, orthopnea, PND, palpitations, dizziness or syncope, but has several months of mild bilateral leg swelling intermittent better in the AM, worse in the PM.   Pt denies polydipsia, polyuria, or new focal neuro s/s.    Pt denies fever, wt loss, night sweats, loss of appetite, or other constitutional symptoms      Wt Readings from Last 3 Encounters:  07/26/21 223 lb (101.2 kg)  07/14/21 220 lb 9 oz (100 kg)  06/23/21 221 lb 14.4 oz (100.7 kg)   BP Readings from Last 3 Encounters:  07/26/21 138/66  07/14/21 (!) 164/62  07/14/21 (!) 186/70   Immunization History  Administered Date(s) Administered   Fluad Quad(high Dose 65+) 11/16/2020   Influenza Whole 04/30/2009, 04/25/2010   Influenza, High Dose Seasonal PF 12/05/2016, 02/17/2018, 12/12/2018   Influenza,inj,Quad PF,6+ Mos 03/05/2013, 04/27/2014, 11/30/2015   Influenza-Unspecified 11/19/2014, 12/26/2019   PFIZER(Purple Top)SARS-COV-2 Vaccination 04/10/2019, 04/28/2019, 12/26/2019, 10/01/2020, 12/20/2020   Pneumococcal Conjugate-13 03/26/2013   Pneumococcal Polysaccharide-23 06/08/2009   Td 03/21/2003   Tdap 05/28/2015, 06/21/2020   Zoster, Live 02/19/2006   There are no preventive care reminders to display for this patient.     Past Medical History:  Diagnosis Date   Carotid stenosis, left    Colon cancer (HCC) 2000   Coronary artery disease    per Dr. Ludwig Clarks note from 04/13/21   Diverticulosis of colon    GERD (gastroesophageal reflux disease)    Glaucoma    History of chemotherapy 2000   colon cancer   History of radiation  therapy 08/06/13- 10/03/13   prostate 7800 cGy 40 sessions, seminal vesicles 5600 cGy 40 sessions   Hyperlipidemia    Hypertension    Left knee DJD    Prostate cancer (HCC) 05/23/2013   gleason 3+4=7, volume 11.5 ml   TIA (transient ischemic attack)    Past Surgical History:  Procedure Laterality Date   COLON SURGERY  2000   colon cancer   COLONOSCOPY     ENDARTERECTOMY Left 04/25/2021   Procedure: LEFT ENDARTERECTOMY CAROTID;  Surgeon: Cephus Shelling, MD;  Location: [facility] OR;  Service: Vascular;  Laterality: Left;   EUS     pancreatic cyst   KNEE ARTHROSCOPY  2007   LT- [facility]   PATCH ANGIOPLASTY Left 04/25/2021   Procedure: PATCH ANGIOPLASTY XENOSURE 1CM X 6CM;  Surgeon: Cephus Shelling, MD;  Location: [facility] OR;  Service: Vascular;  Laterality: Left;   PROSTATE BIOPSY  05/23/13   gleason 7, volume 11.5 ml    reports that he quit smoking about 33 years ago. His smoking use included cigarettes. He has a 22.00 pack-year smoking history. He has never used smokeless tobacco. He reports that he does not currently use alcohol. He reports that he does not use drugs. family history includes Breast cancer in his maternal grandmother; Cancer in his brother, maternal aunt, and mother; Cancer (age of onset:  50) in his daughter; Colon cancer (age of onset: 51) in his sister; Diabetes in his brother. Allergies  Allergen Reactions   Lyrica [Pregabalin] Other (See Comments)    "Just made me feel bad"   Current Outpatient Medications on File Prior to Visit  Medication Sig Dispense Refill   aspirin EC 81 MG EC tablet Take 1 tablet (81 mg total) by mouth daily. Swallow whole. 30 tablet 11   atorvastatin (LIPITOR) 40 MG tablet Take 1 tablet by mouth once daily 90 tablet 0   benazepril (LOTENSIN) 40 MG tablet Take 1 tablet (40 mg total) by mouth daily. 90 tablet 3   clopidogrel (PLAVIX) 75 MG tablet Take 1 tablet (75 mg total) by mouth daily. 21 tablet 0   iron polysaccharides (NU-IRON) 150 MG  capsule Take 1 capsule (150 mg total) by mouth 2 (two) times daily. 180 capsule 1   pantoprazole (PROTONIX) 40 MG tablet Take 1 tablet (40 mg total) by mouth daily. 90 tablet 3   potassium chloride (KLOR-CON M) 10 MEQ tablet Take 1 tablet (10 mEq total) by mouth daily. 30 tablet 1   traMADol (ULTRAM) 50 MG tablet TAKE 1 TABLET BY MOUTH EVERY 6 HOURS AS NEEDED FOR PAIN 120 tablet 1   Travoprost, BAK Free, (TRAVATAN) 0.004 % SOLN ophthalmic solution Place 1 drop into both eyes at bedtime.     VITAMIN D PO Take 1 tablet by mouth daily.     carvedilol (COREG) 12.5 MG tablet Take 12.5 mg by mouth 2 (two) times daily. (Patient not taking: Reported on 07/26/2021)     No current facility-administered medications on file prior to visit.        ROS:  All others reviewed and negative.  Objective        PE:  BP 138/66 (BP Location: Right Arm, Patient Position: Sitting, Cuff Size: Large)   Pulse 73   Temp 99 F (37.2 C) (Oral)   Ht 5\' 6"  (1.676 m)   Wt 223 lb (101.2 kg)   SpO2 100%   BMI 35.99 kg/m                 Constitutional: Pt appears in NAD               HENT: Head: NCAT.                Right Ear: External ear normal.                 Left Ear: External ear normal.                Eyes: . Pupils are equal, round, and reactive to light. Conjunctivae and EOM are normal               Nose: without d/c or deformity               Neck: Neck supple. Gross normal ROM               Cardiovascular: Normal rate and regular rhythm.                 Pulmonary/Chest: Effort normal and breath sounds without rales or wheezing.                Abd:  Soft, NT, ND, + BS, no organomegaly               Neurological: Pt is alert. At baseline orientation, motor grossly intact  Skin: Skin is warm. No rashes, no other new lesions, LE edema - trace bilateral               Psychiatric: Pt behavior is normal without agitation   Micro: none  Cardiac tracings I have personally interpreted today:   none  Pertinent Radiological findings (summarize): none   Lab Results  Component Value Date   WBC 7.6 07/14/2021   HGB 10.3 (L) 07/14/2021   HCT 34.1 (L) 07/14/2021   PLT 236 07/14/2021   GLUCOSE 91 07/14/2021   CHOL 77 04/26/2021   TRIG 62 04/26/2021   HDL 20 (L) 04/26/2021   LDLDIRECT 185.6 05/15/2007   LDLCALC 45 04/26/2021   ALT 9 07/14/2021   AST 10 (L) 07/14/2021   NA 141 07/14/2021   K 3.7 07/14/2021   CL 108 07/14/2021   CREATININE 1.08 07/14/2021   BUN 15 07/14/2021   CO2 29 07/14/2021   TSH 1.12 07/07/2020   PSA 0.08 (L) 07/07/2020   INR 1.1 04/22/2021   HGBA1C 5.1 04/18/2021   Assessment/Plan:  Aaron Moss is a 76 y.o. Black or African American [2] male with  has a past medical history of Carotid stenosis, left, Colon cancer (HCC) (2000), Coronary artery disease, Diverticulosis of colon, GERD (gastroesophageal reflux disease), Glaucoma, History of chemotherapy (2000), History of radiation therapy (08/06/13- 10/03/13), Hyperlipidemia, Hypertension, Left knee DJD, Prostate cancer (HCC) (05/23/2013), and TIA (transient ischemic attack).  Vitamin D deficiency Last vitamin D Lab Results  Component Value Date   VD25OH 31.96 11/16/2020   Low, to start oral replacement   Encounter for well adult exam with abnormal findings Age and sex appropriate education and counseling updated with regular exercise and diet Referrals for preventative services - none needed Immunizations addressed - declines covid booster, shingrix Smoking counseling  - none needed Evidence for depression or other mood disorder - none significant Most recent labs reviewed. I have personally reviewed and have noted: 1) the patient's medical and social history 2) The patient's current medications and supplements 3) The patient's height, weight, and BMI have been recorded in the chart   Hyperlipidemia Lab Results  Component Value Date   LDLCALC 45 04/26/2021   Stable, pt to continue  current statin lipitor 40   Hypertension, uncontrolled BP Readings from Last 3 Encounters:  07/26/21 138/66  07/14/21 (!) 164/62  07/14/21 (!) 186/70   Stable, pt to continue medical treatment norvasc, lotensin,  lopressor   Hyperglycemia Lab Results  Component Value Date   HGBA1C 5.1 04/18/2021   Stable, pt to continue current medical treatment  - diet  Followup: Return in about 6 months (around 01/26/2022).  Oliver Barre, MD 07/30/2021 9:35 PM [facility]

## 2021-07-26 NOTE — Patient Instructions (Signed)
Please continue all other medications as before, and refills have been done if requested. ? ?Please have the pharmacy call with any other refills you may need. ? ?Please continue your efforts at being more active, low cholesterol diet, and weight control. ? ?You are otherwise up to date with prevention measures today. ? ?Please keep your appointments with your specialists as you may have planned ? ?We can hold on more lab testing today ? ?Please make an Appointment to return in 6 months, or sooner if needed ?

## 2021-07-26 NOTE — Assessment & Plan Note (Signed)
Last vitamin D ?Lab Results  ?Component Value Date  ? VD25OH 31.96 11/16/2020  ? ?Low, to start oral replacement ? ?

## 2021-07-27 ENCOUNTER — Telehealth: Payer: Self-pay

## 2021-07-27 NOTE — Telephone Encounter (Signed)
Pt called stating he has had 8 bowel movements since 3am this morning.  Pt asked if it was "OK" to take something for his diarrhea.  Instructed pt to take some Imodium AD and drink some Liquid IV, Gatorade, or Pedialyte to prevent dehydration.  Informed pt that the diarrhea will not resolve instantly but he should see that the diarrhea will gradually decrease over a few days.  Instructed pt to give Dr. Ernestina Penna office a call if his diarrhea has not resolved or gotten better by Friday 07/29/2021.  Pt verbalized understanding and had no further questions. ?

## 2021-07-29 ENCOUNTER — Other Ambulatory Visit: Payer: Self-pay | Admitting: Internal Medicine

## 2021-07-29 NOTE — Telephone Encounter (Signed)
Please refill as per office routine med refill policy (all routine meds to be refilled for 3 mo or monthly (per pt preference) up to one year from last visit, then month to month grace period for 3 mo, then further med refills will have to be denied) ? ?

## 2021-07-30 ENCOUNTER — Encounter: Payer: Self-pay | Admitting: Internal Medicine

## 2021-07-30 NOTE — Assessment & Plan Note (Signed)
BP Readings from Last 3 Encounters:  ?07/26/21 138/66  ?07/14/21 (!) 164/62  ?07/14/21 (!) 186/70  ? ?Stable, pt to continue medical treatment norvasc, lotensin,  lopressor ? ?

## 2021-07-30 NOTE — Assessment & Plan Note (Signed)
Age and sex appropriate education and counseling updated with regular exercise and diet ?Referrals for preventative services - none needed ?Immunizations addressed - declines covid booster, shingrix ?Smoking counseling  - none needed ?Evidence for depression or other mood disorder - none significant ?Most recent labs reviewed. ?I have personally reviewed and have noted: ?1) the patient's medical and social history ?2) The patient's current medications and supplements ?3) The patient's height, weight, and BMI have been recorded in the chart ? ?

## 2021-07-30 NOTE — Assessment & Plan Note (Signed)
Lab Results  ?Component Value Date  ? HGBA1C 5.1 04/18/2021  ? ?Stable, pt to continue current medical treatment  - diet ? ?

## 2021-07-30 NOTE — Assessment & Plan Note (Signed)
Lab Results  ?Component Value Date  ? LDLCALC 45 04/26/2021  ? ?Stable, pt to continue current statin lipitor 40 ? ?

## 2021-08-02 ENCOUNTER — Ambulatory Visit (HOSPITAL_COMMUNITY)
Admission: RE | Admit: 2021-08-02 | Discharge: 2021-08-02 | Disposition: A | Discharge status: Home / Self Care | Payer: Medicare Other | Source: Ambulatory Visit | Attending: Hematology | Admitting: Hematology

## 2021-08-02 ENCOUNTER — Encounter (HOSPITAL_COMMUNITY): Payer: Self-pay

## 2021-08-02 DIAGNOSIS — C170 Malignant neoplasm of duodenum: Secondary | ICD-10-CM | POA: Insufficient documentation

## 2021-08-02 MED ORDER — IOHEXOL 300 MG/ML  SOLN
100.0000 mL | Freq: Once | INTRAMUSCULAR | Status: AC | PRN
  Administered 2021-08-02: 100 mL via INTRAVENOUS

## 2021-08-02 MED ORDER — SODIUM CHLORIDE (PF) 0.9 % IJ SOLN
INTRAMUSCULAR | Status: AC
  Filled 2021-08-02: qty 50

## 2021-08-03 ENCOUNTER — Other Ambulatory Visit: Payer: Self-pay

## 2021-08-03 ENCOUNTER — Other Ambulatory Visit: Payer: Self-pay | Admitting: Internal Medicine

## 2021-08-03 NOTE — Telephone Encounter (Signed)
Please refill as per office routine med refill policy (all routine meds to be refilled for 3 mo or monthly (per pt preference) up to one year from last visit, then month to month grace period for 3 mo, then further med refills will have to be denied) ? ?

## 2021-08-03 NOTE — Progress Notes (Signed)
The proposed treatment discussed in conference is for discussion purpose only and is not a binding recommendation.  The patients have not been physically examined, or presented with their treatment options.  Therefore, final treatment plans cannot be decided.  

## 2021-08-05 ENCOUNTER — Inpatient Hospital Stay (HOSPITAL_BASED_OUTPATIENT_CLINIC_OR_DEPARTMENT_OTHER): Payer: Medicare Other | Admitting: Hematology

## 2021-08-05 ENCOUNTER — Encounter: Payer: Self-pay | Admitting: Hematology

## 2021-08-05 ENCOUNTER — Other Ambulatory Visit: Payer: Self-pay

## 2021-08-05 ENCOUNTER — Inpatient Hospital Stay: Payer: Medicare Other

## 2021-08-05 ENCOUNTER — Inpatient Hospital Stay: Payer: Medicare Other | Attending: Nurse Practitioner

## 2021-08-05 VITALS — BP 159/59 | HR 70 | Temp 98.1°F | Wt 216.9 lb

## 2021-08-05 VITALS — BP 152/55 | HR 60 | Temp 97.9°F | Resp 16

## 2021-08-05 DIAGNOSIS — D508 Other iron deficiency anemias: Secondary | ICD-10-CM | POA: Diagnosis present

## 2021-08-05 DIAGNOSIS — Z7982 Long term (current) use of aspirin: Secondary | ICD-10-CM | POA: Diagnosis not present

## 2021-08-05 DIAGNOSIS — Z8673 Personal history of transient ischemic attack (TIA), and cerebral infarction without residual deficits: Secondary | ICD-10-CM | POA: Diagnosis not present

## 2021-08-05 DIAGNOSIS — Z79899 Other long term (current) drug therapy: Secondary | ICD-10-CM | POA: Diagnosis not present

## 2021-08-05 DIAGNOSIS — C170 Malignant neoplasm of duodenum: Secondary | ICD-10-CM

## 2021-08-05 DIAGNOSIS — Z8546 Personal history of malignant neoplasm of prostate: Secondary | ICD-10-CM | POA: Diagnosis not present

## 2021-08-05 DIAGNOSIS — E876 Hypokalemia: Secondary | ICD-10-CM | POA: Diagnosis not present

## 2021-08-05 DIAGNOSIS — Z9221 Personal history of antineoplastic chemotherapy: Secondary | ICD-10-CM | POA: Insufficient documentation

## 2021-08-05 DIAGNOSIS — Z5112 Encounter for antineoplastic immunotherapy: Secondary | ICD-10-CM | POA: Insufficient documentation

## 2021-08-05 DIAGNOSIS — Z923 Personal history of irradiation: Secondary | ICD-10-CM | POA: Insufficient documentation

## 2021-08-05 DIAGNOSIS — I10 Essential (primary) hypertension: Secondary | ICD-10-CM | POA: Diagnosis not present

## 2021-08-05 DIAGNOSIS — Z7902 Long term (current) use of antithrombotics/antiplatelets: Secondary | ICD-10-CM | POA: Diagnosis not present

## 2021-08-05 DIAGNOSIS — D500 Iron deficiency anemia secondary to blood loss (chronic): Secondary | ICD-10-CM

## 2021-08-05 LAB — CBC WITH DIFFERENTIAL (CANCER CENTER ONLY)
Abs Immature Granulocytes: 0.01 10*3/uL (ref 0.00–0.07)
Basophils Absolute: 0 10*3/uL (ref 0.0–0.1)
Basophils Relative: 0 %
Eosinophils Absolute: 0.2 10*3/uL (ref 0.0–0.5)
Eosinophils Relative: 4 %
HCT: 32.8 % — ABNORMAL LOW (ref 39.0–52.0)
Hemoglobin: 10.2 g/dL — ABNORMAL LOW (ref 13.0–17.0)
Immature Granulocytes: 0 %
Lymphocytes Relative: 20 %
Lymphs Abs: 1.2 10*3/uL (ref 0.7–4.0)
MCH: 24.2 pg — ABNORMAL LOW (ref 26.0–34.0)
MCHC: 31.1 g/dL (ref 30.0–36.0)
MCV: 77.7 fL — ABNORMAL LOW (ref 80.0–100.0)
Monocytes Absolute: 0.3 10*3/uL (ref 0.1–1.0)
Monocytes Relative: 6 %
Neutro Abs: 4.3 10*3/uL (ref 1.7–7.7)
Neutrophils Relative %: 70 %
Platelet Count: 234 10*3/uL (ref 150–400)
RBC: 4.22 MIL/uL (ref 4.22–5.81)
RDW: 17 % — ABNORMAL HIGH (ref 11.5–15.5)
WBC Count: 6.1 10*3/uL (ref 4.0–10.5)
nRBC: 0 % (ref 0.0–0.2)

## 2021-08-05 LAB — CMP (CANCER CENTER ONLY)
ALT: 11 U/L (ref 0–44)
AST: 12 U/L — ABNORMAL LOW (ref 15–41)
Albumin: 3.3 g/dL — ABNORMAL LOW (ref 3.5–5.0)
Alkaline Phosphatase: 96 U/L (ref 38–126)
Anion gap: 4 — ABNORMAL LOW (ref 5–15)
BUN: 16 mg/dL (ref 8–23)
CO2: 30 mmol/L (ref 22–32)
Calcium: 8.6 mg/dL — ABNORMAL LOW (ref 8.9–10.3)
Chloride: 109 mmol/L (ref 98–111)
Creatinine: 1.14 mg/dL (ref 0.61–1.24)
GFR, Estimated: >60 mL/min (ref >60)
Glucose, Bld: 100 mg/dL — ABNORMAL HIGH (ref 70–99)
Potassium: 3.5 mmol/L (ref 3.5–5.1)
Sodium: 143 mmol/L (ref 135–145)
Total Bilirubin: 0.6 mg/dL (ref 0.3–1.2)
Total Protein: 6.6 g/dL (ref 6.5–8.1)

## 2021-08-05 MED ORDER — SODIUM CHLORIDE 0.9 % IV SOLN: Freq: Once | INTRAVENOUS | Status: AC

## 2021-08-05 MED ORDER — SODIUM CHLORIDE 0.9 % IV SOLN
300.0000 mg | Freq: Once | INTRAVENOUS | Status: AC
  Administered 2021-08-05: 300 mg via INTRAVENOUS
  Filled 2021-08-05: qty 200

## 2021-08-05 MED ORDER — SODIUM CHLORIDE 0.9 % IV SOLN
200.0000 mg | Freq: Once | INTRAVENOUS | Status: AC
  Administered 2021-08-05: 200 mg via INTRAVENOUS
  Filled 2021-08-05: qty 200

## 2021-08-05 NOTE — Progress Notes (Signed)
Per Dr. Burr Medico, Pt to receive IV Venofer 300 mg and Keytruda today in inf--OK per Charge RN

## 2021-08-05 NOTE — Patient Instructions (Signed)
[facility] ONCOLOGY  Discharge Instructions: Thank you for choosing [facility] to provide your oncology and hematology care.   If you have a lab appointment with the [facility], please go directly to the [facility] and check in at the registration area.   Wear comfortable clothing and clothing appropriate for easy access to any Portacath or PICC line.   We strive to give you quality time with your provider. You may need to reschedule your appointment if you arrive late (15 or more minutes).  Arriving late affects you and other patients whose appointments are after yours.  Also, if you miss three or more appointments without notifying the office, you may be dismissed from the clinic at the provider's discretion.      For prescription refill requests, have your pharmacy contact our office and allow 72 hours for refills to be completed.    Today you received the following chemotherapy and/or immunotherapy agents: Keytruda      To help prevent nausea and vomiting after your treatment, we encourage you to take your nausea medication as directed.  BELOW ARE SYMPTOMS THAT SHOULD BE REPORTED IMMEDIATELY: *FEVER GREATER THAN 100.4 F (38 C) OR HIGHER *CHILLS OR SWEATING *NAUSEA AND VOMITING THAT IS NOT CONTROLLED WITH YOUR NAUSEA MEDICATION *UNUSUAL SHORTNESS OF BREATH *UNUSUAL BRUISING OR BLEEDING *URINARY PROBLEMS (pain or burning when urinating, or frequent urination) *BOWEL PROBLEMS (unusual diarrhea, constipation, pain near the anus) TENDERNESS IN MOUTH AND THROAT WITH OR WITHOUT PRESENCE OF ULCERS (sore throat, sores in mouth, or a toothache) UNUSUAL RASH, SWELLING OR PAIN  UNUSUAL VAGINAL DISCHARGE OR ITCHING   Items with * indicate a potential emergency and should be followed up as soon as possible or go to the Emergency Department if any problems should occur.  Please show the CHEMOTHERAPY ALERT CARD or IMMUNOTHERAPY ALERT CARD at check-in to  the Emergency Department and triage nurse.  Should you have questions after your visit or need to cancel or reschedule your appointment, please contact [facility]  Dept: [phone]  and follow the prompts.  Office hours are 8:00 a.m. to 4:30 p.m. Monday - Friday. Please note that voicemails left after 4:00 p.m. may not be returned until the following business day.  We are closed weekends and major holidays. You have access to a nurse at all times for urgent questions. Please call the main number to the clinic Dept: [phone] and follow the prompts.   For any non-urgent questions, you may also contact your provider using MyChart. We now offer e-Visits for anyone 23 and older to request care online for non-urgent symptoms. For details visit [email or web address].   Also download the MyChart app! Go to the app store, search "MyChart", open the app, select [facility], and log in with your MyChart username and password.  Due to Covid, a mask is required upon entering the hospital/clinic. If you do not have a mask, one will be given to you upon arrival. For doctor visits, patients may have 1 support person aged 8 or older with them. For treatment visits, patients cannot have anyone with them due to current Covid guidelines and our immunocompromised population.   Iron Sucrose Injection What is this medication? IRON SUCROSE (EYE ern SOO krose) treats low levels of iron (iron deficiency anemia) in people with kidney disease. Iron is a mineral that plays an important role in making red blood cells, which carry oxygen from your lungs to the rest  of your body. This medicine may be used for other purposes; ask your health care provider or pharmacist if you have questions. COMMON BRAND NAME(S): Venofer What should I tell my care team before I take this medication? They need to know if you have any of these conditions: Anemia not caused by low iron levels Heart  disease High levels of iron in the blood Kidney disease Liver disease An unusual or allergic reaction to iron, other medications, foods, dyes, or preservatives Pregnant or trying to get pregnant Breast-feeding How should I use this medication? This medication is for infusion into a vein. It is given in a hospital or clinic setting. Talk to your care team about the use of this medication in children. While this medication may be prescribed for children as young as 2 years for selected conditions, precautions do apply. Overdosage: If you think you have taken too much of this medicine contact a poison control center or emergency room at once. NOTE: This medicine is only for you. Do not share this medicine with others. What if I miss a dose? It is important not to miss your dose. Call your care team if you are unable to keep an appointment. What may interact with this medication? Do not take this medication with any of the following: Deferoxamine Dimercaprol Other iron products This medication may also interact with the following: Chloramphenicol Deferasirox This list may not describe all possible interactions. Give your health care provider a list of all the medicines, herbs, non-prescription drugs, or dietary supplements you use. Also tell them if you smoke, drink alcohol, or use illegal drugs. Some items may interact with your medicine. What should I watch for while using this medication? Visit your care team regularly. Tell your care team if your symptoms do not start to get better or if they get worse. You may need blood work done while you are taking this medication. You may need to follow a special diet. Talk to your care team. Foods that contain iron include: whole grains/cereals, dried fruits, beans, or peas, leafy green vegetables, and organ meats (liver, kidney). What side effects may I notice from receiving this medication? Side effects that you should report to your care team as  soon as possible: Allergic reactions--skin rash, itching, hives, swelling of the face, lips, tongue, or throat Low blood pressure--dizziness, feeling faint or lightheaded, blurry vision Shortness of breath Side effects that usually do not require medical attention (report to your care team if they continue or are bothersome): Flushing Headache Joint pain Muscle pain Nausea Pain, redness, or irritation at injection site This list may not describe all possible side effects. Call your doctor for medical advice about side effects. You may report side effects to FDA at 1-800-FDA-1088. Where should I keep my medication? This medication is given in a hospital or clinic and will not be stored at home. NOTE: This sheet is a summary. It may not cover all possible information. If you have questions about this medicine, talk to your doctor, pharmacist, or health care provider.  2023 Elsevier/Gold Standard (2020-07-30 00:00:00)

## 2021-08-05 NOTE — Progress Notes (Signed)
[place]   [phone] [phone]   Clinic Follow up Note   Patient Care Team: Biagio Borg, MD as PCP - General Carlean Purl Ofilia Neas, MD as Consulting Physician (Gastroenterology) [name], MD as Consulting Physician (Orthopedic Surgery) [name], MD as Consulting Physician (Ophthalmology) [name], MD as Consulting Physician (Oncology) Royston Bake, RN as Oncology Nurse Navigator (Oncology)  Date of Service:  08/05/2021  CHIEF COMPLAINT: f/u of duodenal cancer  CURRENT THERAPY:  [name], started 05/11/21  ASSESSMENT & PLAN:  Aaron Moss is a 76 y.o. male with   1. 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  Although the scope could not pass patient has no signs of obstruction. Biopsy confirmed moderate-poorly differentiated adenocarcinoma -CT CAP on 04/01/21 showed multiple enlarged lymph nodes around the pancreatic head and portacaval station, no other distant metastasis. -He was seen by Dr. Barry Dienes, offered a Whipple surgery.  Unfortunately he had ischemic stroke recently, although he recovered well, he cannot have surgery until 3 months later. -MMR testing showed loss of PMS2.  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 I recommend him to start while he is waiting for surgery.  -he began [name] on 05/11/21. He has tolerated well with mild fatigue.  -restaging CT AP on 08/02/21 showed: similar duodenal wall thickening; decreased size of upper abdominal nodes; no new or progressive disease. I reviewed the results with them today. -his case was discussed in our tumor conference this week. We feel his cancer is resectable. I will refer  him back to Dr. Barry Dienes to discuss surgery; I reviewed Whipple procedure briefly today. We also discussed other option of continue Keytruda if he decides not to pursue surgery.  We discussed small chance (up to 20%) that Beryle Flock may cure his cancer, but it is a higher possibility that his cancer may progress through [facility] and he may loss the option of surgery when he progresses.  -He was think about his options, and to meet Dr. Barry Dienes soon       2. Lynch Syndrome, H/o prostate (2015) and colon (2000) cancers -he has a personal history of stage pT3N0 colon cancer in 2000 and prostate cancer dx in 2015, s/p 8 weeks of IMRT with Dr. Valere Dross. -genetic testing and counseling on 04/07/21 confirmed a PMS2 mutation, as well as VUS in ATM, BLM, and SDHA.   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 -he received three doses IV Venofer 2/22-05/25/21 and one on 06/23/21. -he is on oral iron. -hgb stable at 10.2 today (08/05/21). Given his low iron level on 07/14/21, I recommend additional IV iron infusion.   4. Recent TIA, s/p left endarterectomy carotid on 04/25/21 -On aspirin and Plavix -no residual neurodeficits   5. Hypokalemia  -he has chronic low potassium, on KCL. -improved to WNL today at 3.7 (08/05/21)   6. HTN -followed by Dr. Stanford Breed -his BP is 159/59 today. He states it is lower at home.      Plan -proceed with fifth Keytruda -IV Venofer today or next infusion in 3  weeks -referral back to Dr. Barry Dienes -lab f/u and Keytruda in 3 weeks    No problem-specific Assessment & Plan notes found for this encounter.   SUMMARY OF ONCOLOGIC HISTORY: Oncology History  Adenocarcinoma of duodenum ([place])  03/01/2021 Imaging   EXAM: ABDOMEN ULTRASOUND COMPLETE  IMPRESSION: 1. Simple appearing right renal cyst. 2. Otherwise  negative abdominal ultrasound   03/18/2021 Procedure   Upper Endoscopy/Colonoscopy, Dr. Loletha Carrow  Upper Endoscopy Impression: - Normal esophagus. - A single submucosal papule (nodule) found in the stomach. - Duodenal mass. Biopsied. Concerning for malignancy.  Colonoscopy Impression: - Diverticulosis in the entire examined colon. - The examined portion of the ileum was normal. - Patent end-to-end colo-colonic anastomosis, characterized by healthy appearing mucosa. - Internal hemorrhoids. - No specimens collected.   03/18/2021 Initial Biopsy   Diagnosis Duodenum, Biopsy - INVASIVE MODERATE TO POORLY DIFFERENTIATED ADENOCARCINOMA   03/18/2021 Cancer Staging   Staging form: Small Intestine - Adenocarcinoma, AJCC 8th Edition - Clinical stage from 03/18/2021: Stage IIIA (cTX, cN1, cM0) - Signed by [name], MD on 06/22/2021 Stage prefix: Initial diagnosis    03/22/2021 Initial Diagnosis   Adenocarcinoma of duodenum ([place])   04/01/2021 Imaging   EXAM: CT CHEST, ABDOMEN, AND PELVIS WITH CONTRAST  IMPRESSION: 1. Circumferential wall thickening of the first portion of the duodenum, in keeping with known primary malignancy. 2. Enlarged, hypodense lymph nodes anterior to the pancreatic head and in the portacaval station, concerning for nodal metastatic disease. 3. No other evidence of metastatic disease in the chest, abdomen, or pelvis. 4. Incidental note of a fluid attenuation lesion within the proximal pancreatic body measuring 1.9 x 1.4 cm, with prominence of the pancreatic duct distally, up to 0.6 cm. This lesion was present on remote prior examination dated 05/04/2009, and has slightly increased in size over a long period of time. This is consistent with a small IPMN and benign given indolent behavior over greater than 10 years. 5. Background of very fine centrilobular pulmonary nodules, most concentrated in the lung apices, most commonly seen in smoking-related respiratory  bronchiolitis. 6. Coronary artery disease.   Aortic Atherosclerosis (ICD10-I70.0).    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 Of note, a variant of uncertain significance was detected in the ATM (p.A869G), BLM (c.800-3T>G), and SDHA (p.V632F) genes. Report date is 04/27/2021.  The CancerNext-Expanded gene panel offered by [facility] and includes sequencing, rearrangement, and RNA analysis for the following 77 genes: AIP, ALK, APC, ATM, AXIN2, BAP1, BARD1, BLM, BMPR1A, BRCA1, BRCA2, BRIP1, CDC73, CDH1, CDK4, CDKN1B, CDKN2A, CHEK2, CTNNA1, DICER1, FANCC, FH, FLCN, GALNT12, KIF1B, LZTR1, MAX, MEN1, MET, MLH1, MSH2, MSH3, MSH6, MUTYH, NBN, NF1, NF2, NTHL1, PALB2, PHOX2B, PMS2, POT1, PRKAR1A, PTCH1, PTEN, RAD51C, RAD51D, RB1, RECQL, RET, SDHA, SDHAF2, SDHB, SDHC, SDHD, SMAD4, SMARCA4, SMARCB1, SMARCE1, STK11, SUFU, TMEM127, TP53, TSC1, TSC2, VHL and XRCC2 (sequencing and deletion/duplication); EGFR, EGLN1, HOXB13, KIT, MITF, PDGFRA, POLD1, and POLE (sequencing only); EPCAM and GREM1 (deletion/duplication only).    05/11/2021 -  Chemotherapy   Patient is on Treatment Plan : COLORECTAL Pembrolizumab (200) q21d         INTERVAL HISTORY:  Aaron Moss is here for a follow up of duodenal cancer. He was last seen by me on 07/14/21. He presents to the clinic accompanied by his wife and daughter(?). He reports he is doing well overall on treatment. He denies any significant side effects from last treatment.  All other systems were reviewed with the patient and are negative.  MEDICAL HISTORY:  Past Medical History:  Diagnosis Date   Carotid stenosis, left    Colon cancer ([place]) 2000   Coronary artery disease    per Dr. Jacalyn Lefevre note from 04/13/21   Diverticulosis of colon    GERD (gastroesophageal reflux disease)    Glaucoma    History of chemotherapy 2000   colon cancer   History of  radiation therapy 08/06/13- 10/03/13   prostate 7800 cGy 40 sessions, seminal vesicles 5600 cGy 40 sessions   Hyperlipidemia    Hypertension    Left knee DJD    Prostate cancer ([place]) 05/23/2013   gleason 3+4=7, volume 11.5 ml   TIA (transient ischemic attack)     SURGICAL HISTORY: Past Surgical History:  Procedure Laterality Date   COLON SURGERY  2000   colon cancer   COLONOSCOPY     ENDARTERECTOMY Left 04/25/2021   Procedure: LEFT ENDARTERECTOMY CAROTID;  Surgeon: Marty Heck, MD;  Location: [facility];  Service: Vascular;  Laterality: Left;   EUS     pancreatic cyst   KNEE ARTHROSCOPY  2007   LT- [facility]   PATCH ANGIOPLASTY Left 04/25/2021   Procedure: PATCH ANGIOPLASTY XENOSURE 1CM X 6CM;  Surgeon: Marty Heck, MD;  Location: [facility] OR;  Service: Vascular;  Laterality: Left;   PROSTATE BIOPSY  05/23/13   gleason 7, volume 11.5 ml    I have reviewed the social history and family history with the patient and they are unchanged from previous note.  ALLERGIES:  is allergic to lyrica [pregabalin].  MEDICATIONS:  Current Outpatient Medications  Medication Sig Dispense Refill   amLODipine (NORVASC) 10 MG tablet Take 1 tablet by mouth once daily 90 tablet 3   benazepril (LOTENSIN) 40 MG tablet Take 1 tablet by mouth once daily 90 tablet 3   aspirin EC 81 MG EC tablet Take 1 tablet (81 mg total) by mouth daily. Swallow whole. 30 tablet 11   atorvastatin (LIPITOR) 40 MG tablet Take 1 tablet by mouth once daily 90 tablet 0   carvedilol (COREG) 12.5 MG tablet Take 12.5 mg by mouth 2 (two) times daily. (Patient not taking: Reported on 07/26/2021)     clopidogrel (PLAVIX) 75 MG tablet Take 1 tablet (75 mg total) by mouth daily. 21 tablet 0   iron polysaccharides (NU-IRON) 150 MG capsule Take 1 capsule (150 mg total) by mouth 2 (two) times daily. 180 capsule 1   pantoprazole (PROTONIX) 40 MG tablet Take 1 tablet (40 mg total) by mouth daily. 90 tablet 3   potassium chloride (KLOR-CON  M) 10 MEQ tablet Take 1 tablet (10 mEq total) by mouth daily. 30 tablet 1   traMADol (ULTRAM) 50 MG tablet TAKE 1 TABLET BY MOUTH EVERY 6 HOURS AS NEEDED FOR PAIN 120 tablet 1   Travoprost, BAK Free, (TRAVATAN) 0.004 % SOLN ophthalmic solution Place 1 drop into both eyes at bedtime.     VITAMIN D PO Take 1 tablet by mouth daily.     No current facility-administered medications for this visit.    PHYSICAL EXAMINATION: ECOG PERFORMANCE STATUS: 1 - Symptomatic but completely ambulatory  Vitals:   08/05/21 1014  BP: (!) 159/59  Pulse: 70  Temp: 98.1 F (36.7 C)  SpO2: 100%   Wt Readings from Last 3 Encounters:  08/05/21 216 lb 14.4 oz (98.4 kg)  07/26/21 223 lb (101.2 kg)  07/14/21 220 lb 9 oz (100 kg)  GENERAL:alert, no distress and comfortable SKIN: skin color normal, no rashes or significant lesions EYES: normal, Conjunctiva are pink and non-injected, sclera clear  NEURO: alert & oriented x 3 with fluent speech  LABORATORY DATA:  I have reviewed the data as listed    Latest Ref Rng & Units 08/05/2021    9:51 AM 07/14/2021   12:36 PM 06/23/2021    7:43 AM  CBC  WBC 4.0 - 10.5 K/uL 6.1   7.6   7.0    Hemoglobin 13.0 - 17.0 g/dL 10.2   10.3   9.3    Hematocrit 39.0 - 52.0 % 32.8   34.1   30.8    Platelets 150 - 400 K/uL 234   236   256          Latest Ref Rng & Units 08/05/2021    9:51 AM 07/14/2021   12:36 PM 06/23/2021    7:43 AM  CMP  Glucose 70 - 99 mg/dL 100   91   138    BUN 8 - 23 mg/dL _0 Creatinine 0.61 - 1.24 mg/dL 1.14   1.08   1.02    Sodium 135 - 145 mmol/L 143   141   145    Potassium 3.5 - 5.1 mmol/L 3.5   3.7   3.3    Chloride 98 - 111 mmol/L 109   108   110    CO2 22 - 32 mmol/L _1 Calcium 8.9 - 10.3 mg/dL 8.6   8.9   8.6    Total Protein 6.5 - 8.1 g/dL 6.6   6.6   6.3    Total Bilirubin 0.3 - 1.2 mg/dL 0.6   0.6   0.7    Alkaline Phos 38 - 126 U/L 96   106   101    AST 15 - 41 U/L _2 ALT 0 - 44 U/L _3 RADIOGRAPHIC STUDIES: I have personally reviewed the radiological images as listed and agreed with the findings in the report. No results found.    No orders of the defined types were placed in this encounter.  All questions were answered. The patient knows to call the clinic with any problems, questions or concerns. No barriers to learning was detected. The total time spent in the appointment was 30 minutes.     [name], MD 08/05/2021   I, Wilburn Mylar, am acting as scribe for [name], MD.   I have reviewed the above documentation for accuracy and completeness, and I agree with the above.

## 2021-08-05 NOTE — Progress Notes (Signed)
Pt declined to stay for 30 minute post Venofer infusion obs. Pt tolerated trtmt well w/out incident. VSS at discharge.  Ambulatory to lobby.

## 2021-08-12 ENCOUNTER — Ambulatory Visit (INDEPENDENT_AMBULATORY_CARE_PROVIDER_SITE_OTHER): Payer: Medicare Other

## 2021-08-12 ENCOUNTER — Telehealth: Payer: Self-pay

## 2021-08-12 DIAGNOSIS — Z0000 Encounter for general adult medical examination without abnormal findings: Secondary | ICD-10-CM | POA: Diagnosis not present

## 2021-08-12 NOTE — Progress Notes (Signed)
I connected with Aaron Moss today by telephone and verified that I am speaking with the correct person using two identifiers. Location patient: home Location provider: work Persons participating in the virtual visit: patient, provider.   I discussed the limitations, risks, security and privacy concerns of performing an evaluation and management service by telephone and the availability of in person appointments. I also discussed with the patient that there may be a patient responsible charge related to this service. The patient expressed understanding and verbally consented to this telephonic visit.    Interactive audio and video telecommunications were attempted between this provider and patient, however failed, due to patient having technical difficulties OR patient did not have access to video capability.  We continued and completed visit with audio only.  Some vital signs may be absent or patient reported.   Time Spent with patient on telephone encounter: 30 minutes  Subjective:   Aaron Moss is a 76 y.o. male who presents for Medicare Annual/Subsequent preventive examination.  Review of Systems     Cardiac Risk Factors include: advanced age (>32mn, >>70women);dyslipidemia;family history of premature cardiovascular disease;hypertension;male gender;obesity (BMI >30kg/m2)     Objective:    There were no vitals filed for this visit. There is no height or weight on file to calculate BMI.     08/12/2021    9:18 AM 08/05/2021   11:59 AM 06/23/2021   10:14 AM 06/02/2021    3:32 PM 04/25/2021    6:00 PM 04/22/2021   11:00 AM 04/18/2021    1:30 PM  Advanced Directives  Does Patient Have a Medical Advance Directive? No No No No No No No  Would patient like information on creating a medical advance directive? No - Patient declined No - Patient declined No - Patient declined No - Patient declined No - Patient declined No - Patient declined No - Patient declined    Current Medications  (verified) Outpatient Encounter Medications as of 08/12/2021  Medication Sig   amLODipine (NORVASC) 10 MG tablet Take 1 tablet by mouth once daily   benazepril (LOTENSIN) 40 MG tablet Take 1 tablet by mouth once daily   aspirin EC 81 MG EC tablet Take 1 tablet (81 mg total) by mouth daily. Swallow whole.   atorvastatin (LIPITOR) 40 MG tablet Take 1 tablet by mouth once daily   carvedilol (COREG) 12.5 MG tablet Take 12.5 mg by mouth 2 (two) times daily. (Patient not taking: Reported on 07/26/2021)   clopidogrel (PLAVIX) 75 MG tablet Take 1 tablet (75 mg total) by mouth daily.   iron polysaccharides (NU-IRON) 150 MG capsule Take 1 capsule (150 mg total) by mouth 2 (two) times daily.   pantoprazole (PROTONIX) 40 MG tablet Take 1 tablet (40 mg total) by mouth daily.   potassium chloride (KLOR-CON M) 10 MEQ tablet Take 1 tablet (10 mEq total) by mouth daily.   traMADol (ULTRAM) 50 MG tablet TAKE 1 TABLET BY MOUTH EVERY 6 HOURS AS NEEDED FOR PAIN   Travoprost, BAK Free, (TRAVATAN) 0.004 % SOLN ophthalmic solution Place 1 drop into both eyes at bedtime.   VITAMIN D PO Take 1 tablet by mouth daily.   No facility-administered encounter medications on file as of 08/12/2021.    Allergies (verified) Lyrica [pregabalin]   History: Past Medical History:  Diagnosis Date   Carotid stenosis, left    [place]   Coronary artery disease    per Dr. CJacalyn Lefevrenote from 04/13/21   Diverticulosis of colon  GERD (gastroesophageal reflux disease)    Glaucoma    History of chemotherapy 2000   colon cancer   History of radiation therapy 08/06/13- 10/03/13   prostate 7800 cGy 40 sessions, seminal vesicles 5600 cGy 40 sessions   Hyperlipidemia    Hypertension    Left knee DJD    Prostate cancer ([place]) 05/23/2013   gleason 3+4=7, volume 11.5 ml   TIA (transient ischemic attack)    Past Surgical History:  Procedure Laterality Date   COLON SURGERY  2000   colon cancer   COLONOSCOPY      ENDARTERECTOMY Left 04/25/2021   Procedure: LEFT ENDARTERECTOMY CAROTID;  Surgeon: Marty Heck, MD;  Location: [facility];  Service: Vascular;  Laterality: Left;   EUS     pancreatic cyst   KNEE ARTHROSCOPY  2007   LT- [facility]   PATCH ANGIOPLASTY Left 04/25/2021   Procedure: PATCH ANGIOPLASTY XENOSURE 1CM X 6CM;  Surgeon: Marty Heck, MD;  Location[facility];  Service: Vascular;  Laterality: Left;   PROSTATE BIOPSY  05/23/13   gleason 7, volume 11.5 ml   Family History  Problem Relation Age of Onset   Cancer Mother        Liver? bladder?, dx. late 71s   Colon cancer Sister 21   Cancer Brother        Lung   Diabetes Brother    Cancer Maternal Aunt        unknown type   Breast cancer Maternal Grandmother        dx. >50   Cancer Daughter 10       colon   Esophageal cancer Neg Hx    Rectal cancer Neg Hx    Stomach cancer Neg Hx    Social History   Socioeconomic History   Marital status: Married    Spouse name: Not on file   Number of children: 4   Years of education: Not on file   Highest education level: Not on file  Occupational History   Not on file  Tobacco Use   Smoking status: Former    Packs/day: 1.00    Years: 22.00    Pack years: 22.00    Types: Cigarettes    Quit date: 03/20/1988    Years since quitting: 33.4   Smokeless tobacco: Never  Vaping Use   Vaping Use: Never used  Substance and Sexual Activity   Alcohol use: Not Currently    Comment: rare   Drug use: No   Sexual activity: Not Currently  Other Topics Concern   Not on file  Social History Narrative   Not on file   Social Determinants of Health   Financial Resource Strain: Not on file  Food Insecurity: Not on file  Transportation Needs: Not on file  Physical Activity: Not on file  Stress: Not on file  Social Connections: Not on file    Tobacco Counseling Counseling given: Not Answered   Clinical Intake:  Pre-visit preparation completed: Yes  Pain : No/denies pain      BMI - recorded: 35.03 Nutritional Status: BMI > 30  Obese Nutritional Risks: None Diabetes: No  How often do you need to have someone help you when you read instructions, pamphlets, or other written materials from your doctor or pharmacy?: 1 - Never What is the last grade level you completed in school?: HSG  Diabetic? no  Interpreter Needed?: No  Information entered by :: Lisette Abu, LPN   Activities of Daily Living  08/12/2021    9:24 AM 04/25/2021    6:00 PM  In your present state of health, do you have any difficulty performing the following activities:  Hearing? 1 0  Comment wears hearing aids   Vision? 0 0  Difficulty concentrating or making decisions? 0 0  Walking or climbing stairs? 0 0  Dressing or bathing? 0 0  Doing errands, shopping? 0 1  Preparing Food and eating ? N   Using the Toilet? N   In the past six months, have you accidently leaked urine? N   Do you have problems with loss of bowel control? N   Managing your Medications? N   Managing your Finances? N   Housekeeping or managing your Housekeeping? N     Patient Care Team: Biagio Borg, MD as PCP - General Carlean Purl Ofilia Neas, MD as Consulting Physician (Gastroenterology) [name], MD as Consulting Physician (Orthopedic Surgery) [name], MD as Consulting Physician (Ophthalmology) [name], MD as Consulting Physician (Oncology) Royston Bake, RN as Oncology Nurse Navigator (Oncology)  Indicate any recent Medical Services you may have received from other than [facility] providers in the past year (date may be approximate).     Assessment:   This is a routine wellness examination for Jaekwon.  Hearing/Vision screen Hearing Screening - Comments:: Patient has hearing difficulty and wears hearing aids. Vision Screening - Comments:: Patient does not wear any corrective lenses/contacts.   Eye exam done by:  [name], MD.   Dietary issues and exercise activities  discussed: Current Exercise Habits: The patient does not participate in regular exercise at present, Exercise limited by: Other - see comments (Recent stroke)   Goals Addressed   None   Depression Screen    08/12/2021    9:24 AM 07/26/2021    8:24 AM 08/11/2020   10:52 AM 07/07/2020   10:50 AM 07/18/2019   10:44 AM 05/06/2019   10:06 AM 05/06/2019    9:27 AM  PHQ 2/9 Scores  PHQ - 2 Score 0 0 0 0 0 0 0  PHQ- 9 Score  3         Fall Risk    08/12/2021    9:20 AM 07/26/2021    8:24 AM 08/11/2020   10:54 AM 07/07/2020   10:50 AM 07/18/2019   10:44 AM  Fall Risk   Falls in the past year? 0 0 0 0 0  Number falls in past yr: 0 0 0  0  Injury with Fall? 0 0 0  0  Risk for fall due to : No Fall Risks  No Fall Risks  Orthopedic patient  Follow up Falls evaluation completed  Falls evaluation completed  Falls evaluation completed;Education provided;Falls prevention discussed    FALL RISK PREVENTION PERTAINING TO THE HOME:  Any stairs in or around the home? No  If so, are there any without handrails? No  Home free of loose throw rugs in walkways, pet beds, electrical cords, etc? Yes  Adequate lighting in your home to reduce risk of falls? Yes   ASSISTIVE DEVICES UTILIZED TO PREVENT FALLS:  Life alert? No  Use of a cane, walker or w/c? No  Grab bars in the bathroom? No  Shower chair or bench in shower? No  Elevated toilet seat or a handicapped toilet? Yes   TIMED UP AND GO:  Was the test performed? No .  Length of time to ambulate 10 feet: n/a sec.   Appearance of gait: Patient not evaluated  for gait during this visit.  Cognitive Function:        08/12/2021    9:25 AM 07/18/2019   10:46 AM  6CIT Screen  What Year? 0 points 0 points  What month? 0 points 0 points  What time? 0 points 0 points  Count back from 20 0 points 0 points  Months in reverse 0 points 0 points  Repeat phrase 0 points 0 points  Total Score 0 points 0 points    Immunizations Immunization History   Administered Date(s) Administered   Fluad Quad(high Dose 65+) 11/16/2020   Influenza Whole 04/30/2009, 04/25/2010   Influenza, High Dose Seasonal PF 12/05/2016, 02/17/2018, 12/12/2018   Influenza,inj,Quad PF,6+ Mos 03/05/2013, 04/27/2014, 11/30/2015   Influenza-Unspecified 11/19/2014, 12/26/2019   PFIZER(Purple Top)SARS-COV-2 Vaccination 04/10/2019, 04/28/2019, 12/26/2019, 10/01/2020, 12/20/2020   Pneumococcal Conjugate-13 03/26/2013   Pneumococcal Polysaccharide-23 06/08/2009   Td 03/21/2003   Tdap 05/28/2015, 06/21/2020   Zoster, Live 02/19/2006    TDAP status: Up to date  Flu Vaccine status: Up to date  Pneumococcal vaccine status: Up to date  Covid-19 vaccine status: Completed vaccines  Qualifies for Shingles Vaccine? Yes   Zostavax completed Yes   Shingrix Completed?: No.    Education has been provided regarding the importance of this vaccine. Patient has been advised to call insurance company to determine out of pocket expense if they have not yet received this vaccine. Advised may also receive vaccine at local pharmacy or Health Dept. Verbalized acceptance and understanding.  Screening Tests Health Maintenance  Topic Date Due   COVID-19 Vaccine (6 - Booster for Pfizer series) 02/14/2021   Zoster Vaccines- Shingrix (1 of 2) 10/26/2021 (Originally 12/12/1964)   INFLUENZA VACCINE  10/18/2021   COLONOSCOPY (Pts 45-82yr Insurance coverage will need to be confirmed)  03/18/2026   TETANUS/TDAP  06/22/2030   Pneumonia Vaccine 76 Years old  Completed   Hepatitis C Screening  Completed   HPV VACCINES  Aged Out    Health Maintenance  Health Maintenance Due  Topic Date Due   COVID-19 Vaccine (6 - Booster for [place]) 02/14/2021    Colorectal cancer screening: Type of screening: Colonoscopy. Completed 03/18/2021. Repeat every 5 years  Lung Cancer Screening: (Low Dose CT Chest recommended if Age 78-80years, 30 pack-year currently smoking OR have quit w/in 15years.)  does not qualify.   Lung Cancer Screening Referral: no  Additional Screening:  Hepatitis C Screening: does qualify; Completed 06/01/2016  Vision Screening: Recommended annual ophthalmology exams for early detection of glaucoma and other disorders of the eye. Is the patient up to date with their annual eye exam?  Yes  Who is the provider or what is the name of the office in which the patient attends annual eye exams? CWarden Fillers MD. If pt is not established with a provider, would they like to be referred to a provider to establish care? No .   Dental Screening: Recommended annual dental exams for proper oral hygiene  Community Resource Referral / Chronic Care Management: CRR required this visit?  No   CCM required this visit?  No      Plan:     I have personally reviewed and noted the following in the patient's chart:   Medical and social history Use of alcohol, tobacco or illicit drugs  Current medications and supplements including opioid prescriptions. Patient is not currently taking opioid prescriptions. Functional ability and status Nutritional status Physical activity Advanced directives List of other physicians Hospitalizations, surgeries, and ER visits in  previous 12 months Vitals Screenings to include cognitive, depression, and falls Referrals and appointments  In addition, I have reviewed and discussed with patient certain preventive protocols, quality metrics, and best practice recommendations. A written personalized care plan for preventive services as well as general preventive health recommendations were provided to patient.     Sheral Flow, LPN   0/21/1155   Nurse Notes:  There were no vitals filed for this visit. There is no height or weight on file to calculate BMI. Patient stated that he has no issues with gait or balance; does not use any assistive devices.

## 2021-08-12 NOTE — Telephone Encounter (Signed)
   Pre-operative Risk Assessment    Patient Name: Aaron Moss  DOB: May 28, 1945 MRN: [id number]      Request for Surgical Clearance    Procedure:   Whipple   Date of Surgery:  Clearance TBD                                 Surgeon:  Dr. [name] Surgeon's Group or Practice Name:  [facility] Phone number:  [phone] Fax number:  [phone] Attn: Emeline Gins, CMA   Type of Clearance Requested:   - Medical  - Pharmacy:  Hold Aspirin and Clopidogrel (Plavix)     Type of Anesthesia:  General    Additional requests/questions:    Tana Conch   08/12/2021, 10:47 AM \

## 2021-08-12 NOTE — Patient Instructions (Signed)
Aaron Moss , Thank you for taking time to come for your Medicare Wellness Visit. I appreciate your ongoing commitment to your health goals. Please review the following plan we discussed and let me know if I can assist you in the future.   Screening recommendations/referrals: Colonoscopy: 03/18/2021; due every 5 years Recommended yearly ophthalmology/optometry visit for glaucoma screening and checkup Recommended yearly dental visit for hygiene and checkup  Vaccinations: Influenza vaccine: 11/16/2020 Pneumococcal vaccine: 06/08/2009, 03/26/2013 Tdap vaccine: 06/21/2020; due every 10 years Zoster vaccine: 02/19/2006   Covid-19:04/10/2019, 04/28/2019, 12/26/2019, 10/01/2020, 12/20/2020  Advanced directives: No  Conditions/risks identified: Yes  Next appointment: Please schedule your next Medicare Wellness Visit with your Nurse Health Advisor in 1 year by calling [phone].  Preventive Care 15 Years and Older, Male Preventive care refers to lifestyle choices and visits with your health care provider that can promote health and wellness. What does preventive care include? A yearly physical exam. This is also called an annual well check. Dental exams once or twice a year. Routine eye exams. Ask your health care provider how often you should have your eyes checked. Personal lifestyle choices, including: Daily care of your teeth and gums. Regular physical activity. Eating a healthy diet. Avoiding tobacco and drug use. Limiting alcohol use. Practicing safe sex. Taking low doses of aspirin every day. Taking vitamin and mineral supplements as recommended by your health care provider. What happens during an annual well check? The services and screenings done by your health care provider during your annual well check will depend on your age, overall health, lifestyle risk factors, and family history of disease. Counseling  Your health care provider may ask you questions about your: Alcohol  use. Tobacco use. Drug use. Emotional well-being. Home and relationship well-being. Sexual activity. Eating habits. History of falls. Memory and ability to understand (cognition). Work and work Statistician. Screening  You may have the following tests or measurements: Height, weight, and BMI. Blood pressure. Lipid and cholesterol levels. These may be checked every 5 years, or more frequently if you are over 56 years old. Skin check. Lung cancer screening. You may have this screening every year starting at age 42 if you have a 30-pack-year history of smoking and currently smoke or have quit within the past 15 years. Fecal occult blood test (FOBT) of the stool. You may have this test every year starting at age 3. Flexible sigmoidoscopy or colonoscopy. You may have a sigmoidoscopy every 5 years or a colonoscopy every 10 years starting at age 62. Prostate cancer screening. Recommendations will vary depending on your family history and other risks. Hepatitis C blood test. Hepatitis B blood test. Sexually transmitted disease (STD) testing. Diabetes screening. This is done by checking your blood sugar (glucose) after you have not eaten for a while (fasting). You may have this done every 1-3 years. Abdominal aortic aneurysm (AAA) screening. You may need this if you are a current or former smoker. Osteoporosis. You may be screened starting at age 37 if you are at high risk. Talk with your health care provider about your test results, treatment options, and if necessary, the need for more tests. Vaccines  Your health care provider may recommend certain vaccines, such as: Influenza vaccine. This is recommended every year. Tetanus, diphtheria, and acellular pertussis (Tdap, Td) vaccine. You may need a Td booster every 10 years. Zoster vaccine. You may need this after age 42. Pneumococcal 13-valent conjugate (PCV13) vaccine. One dose is recommended after age 74. Pneumococcal polysaccharide  (PPSV23)  vaccine. One dose is recommended after age 25. Talk to your health care provider about which screenings and vaccines you need and how often you need them. This information is not intended to replace advice given to you by your health care provider. Make sure you discuss any questions you have with your health care provider. Document Released: 04/02/2015 Document Revised: 11/24/2015 Document Reviewed: 01/05/2015 Elsevier Interactive Patient Education  2017 [employer] Prevention in the Home Falls can cause injuries. They can happen to people of all ages. There are many things you can do to make your home safe and to help prevent falls. What can I do on the outside of my home? Regularly fix the edges of walkways and driveways and fix any cracks. Remove anything that might make you trip as you walk through a door, such as a raised step or threshold. Trim any bushes or trees on the path to your home. Use bright outdoor lighting. Clear any walking paths of anything that might make someone trip, such as rocks or tools. Regularly check to see if handrails are loose or broken. Make sure that both sides of any steps have handrails. Any raised decks and porches should have guardrails on the edges. Have any leaves, snow, or ice cleared regularly. Use sand or salt on walking paths during winter. Clean up any spills in your garage right away. This includes oil or grease spills. What can I do in the bathroom? Use night lights. Install grab bars by the toilet and in the tub and shower. Do not use towel bars as grab bars. Use non-skid mats or decals in the tub or shower. If you need to sit down in the shower, use a plastic, non-slip stool. Keep the floor dry. Clean up any water that spills on the floor as soon as it happens. Remove soap buildup in the tub or shower regularly. Attach bath mats securely with double-sided non-slip rug tape. Do not have throw rugs and other things on the  floor that can make you trip. What can I do in the bedroom? Use night lights. Make sure that you have a light by your bed that is easy to reach. Do not use any sheets or blankets that are too big for your bed. They should not hang down onto the floor. Have a firm chair that has side arms. You can use this for support while you get dressed. Do not have throw rugs and other things on the floor that can make you trip. What can I do in the kitchen? Clean up any spills right away. Avoid walking on wet floors. Keep items that you use a lot in easy-to-reach places. If you need to reach something above you, use a strong step stool that has a grab bar. Keep electrical cords out of the way. Do not use floor polish or wax that makes floors slippery. If you must use wax, use non-skid floor wax. Do not have throw rugs and other things on the floor that can make you trip. What can I do with my stairs? Do not leave any items on the stairs. Make sure that there are handrails on both sides of the stairs and use them. Fix handrails that are broken or loose. Make sure that handrails are as long as the stairways. Check any carpeting to make sure that it is firmly attached to the stairs. Fix any carpet that is loose or worn. Avoid having throw rugs at the top or bottom of  the stairs. If you do have throw rugs, attach them to the floor with carpet tape. Make sure that you have a light switch at the top of the stairs and the bottom of the stairs. If you do not have them, ask someone to add them for you. What else can I do to help prevent falls? Wear shoes that: Do not have high heels. Have rubber bottoms. Are comfortable and fit you well. Are closed at the toe. Do not wear sandals. If you use a stepladder: Make sure that it is fully opened. Do not climb a closed stepladder. Make sure that both sides of the stepladder are locked into place. Ask someone to hold it for you, if possible. Clearly mark and make  sure that you can see: Any grab bars or handrails. First and last steps. Where the edge of each step is. Use tools that help you move around (mobility aids) if they are needed. These include: Canes. Walkers. Scooters. Crutches. Turn on the lights when you go into a dark area. Replace any light bulbs as soon as they burn out. Set up your furniture so you have a clear path. Avoid moving your furniture around. If any of your floors are uneven, fix them. If there are any pets around you, be aware of where they are. Review your medicines with your doctor. Some medicines can make you feel dizzy. This can increase your chance of falling. Ask your doctor what other things that you can do to help prevent falls. This information is not intended to replace advice given to you by your health care provider. Make sure you discuss any questions you have with your health care provider. Document Released: 12/31/2008 Document Revised: 08/12/2015 Document Reviewed: 04/10/2014 Elsevier Interactive Patient Education  2017 [employer].

## 2021-08-12 NOTE — Telephone Encounter (Signed)
Patient's aspirin and Plavix are not prescribed by cardiology.  Recommendations for holding medications will need to come from prescribing provider.  Preoperative team, please contact this patient and set up a phone call appointment for further cardiac evaluation.  Thank you for your help.  Jossie Ng. Jaivyn Gulla NP-C    08/12/2021, 11:18 AM [address] Office [phone] Fax [phone]

## 2021-08-14 IMAGING — DX DG LUMBAR SPINE COMPLETE 4+V
5 series · 5 of 5 positions shown · non-contrast
Comparison: July 06, 2008.

CLINICAL DATA: Chronic low back pain.

EXAM:
LUMBAR SPINE - COMPLETE 4+ VIEW

[lumbar spine ap]
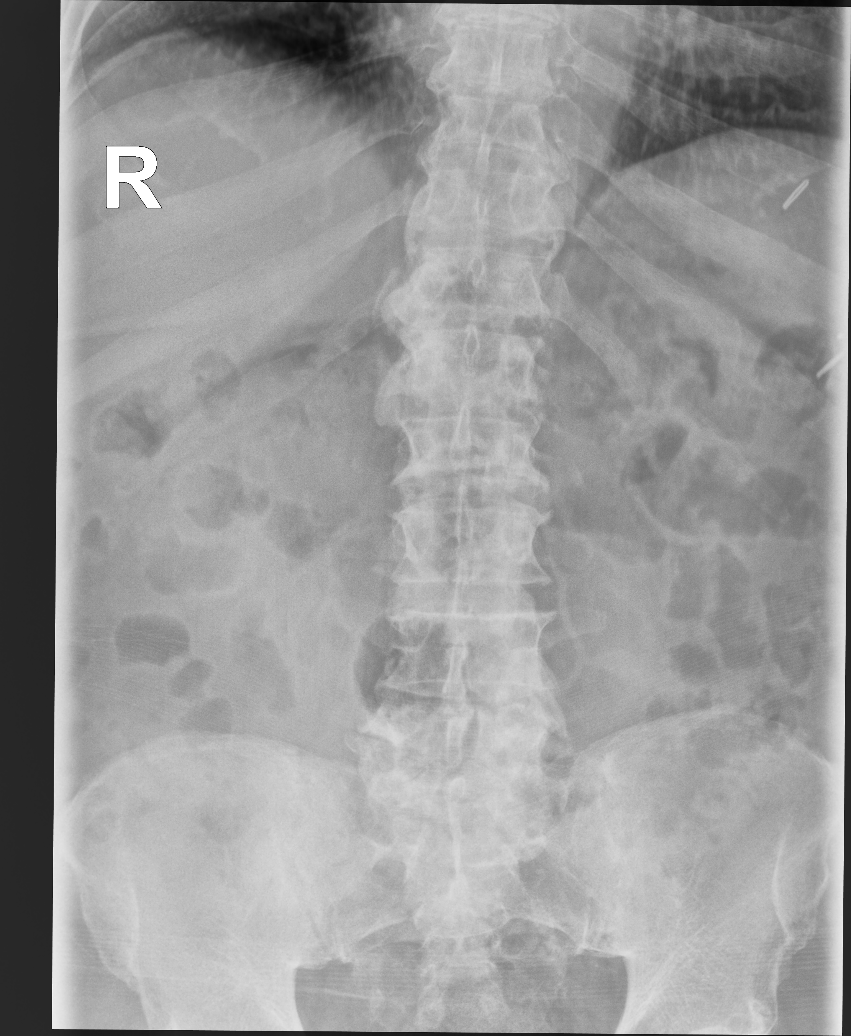

[lumbar spine mlo (1 of 2)]
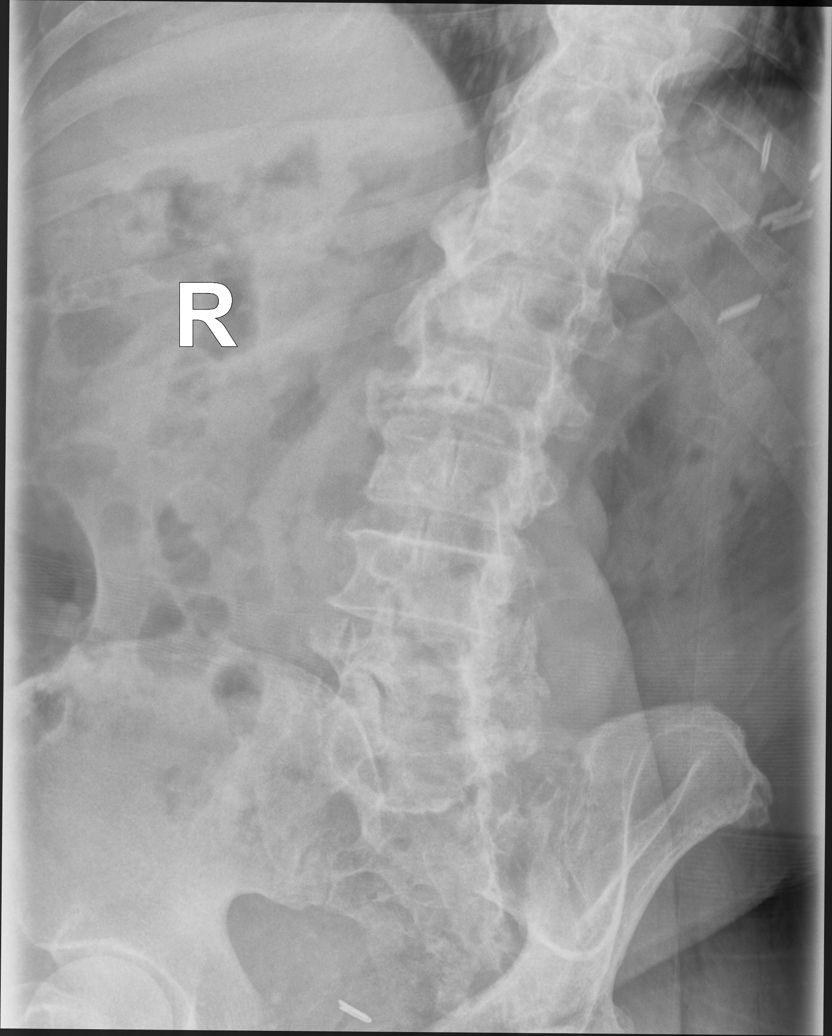

[lumbar spine mlo (2 of 2)]
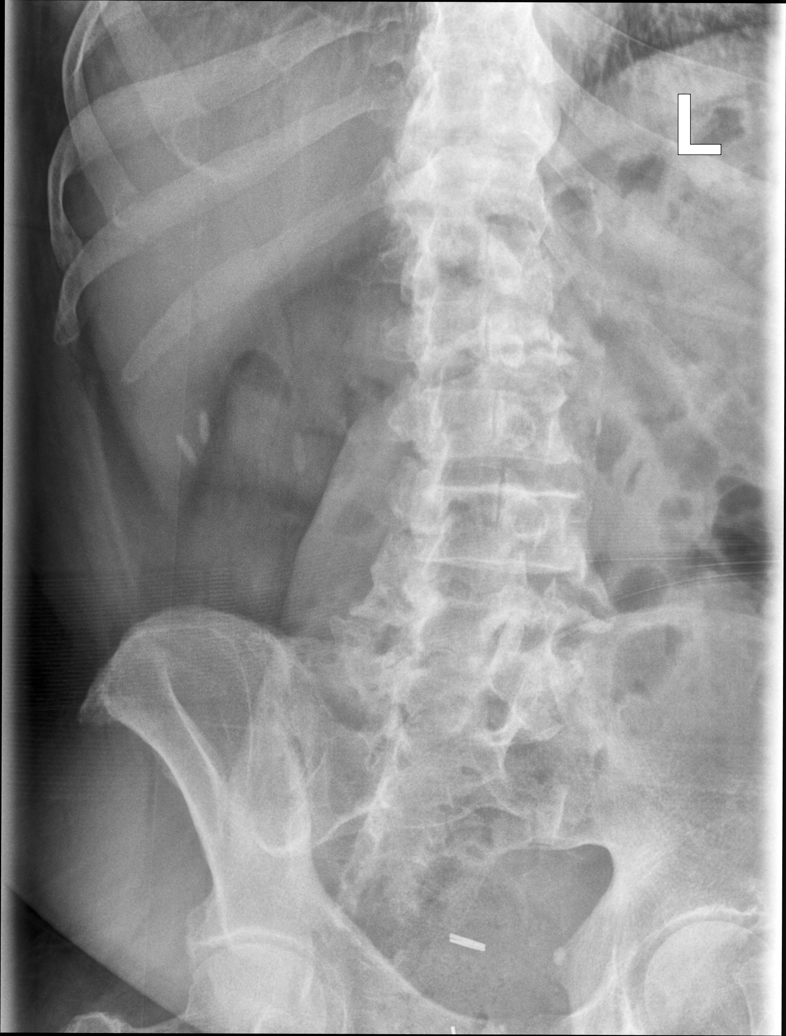

[lumbar spine lat (1 of 2)]
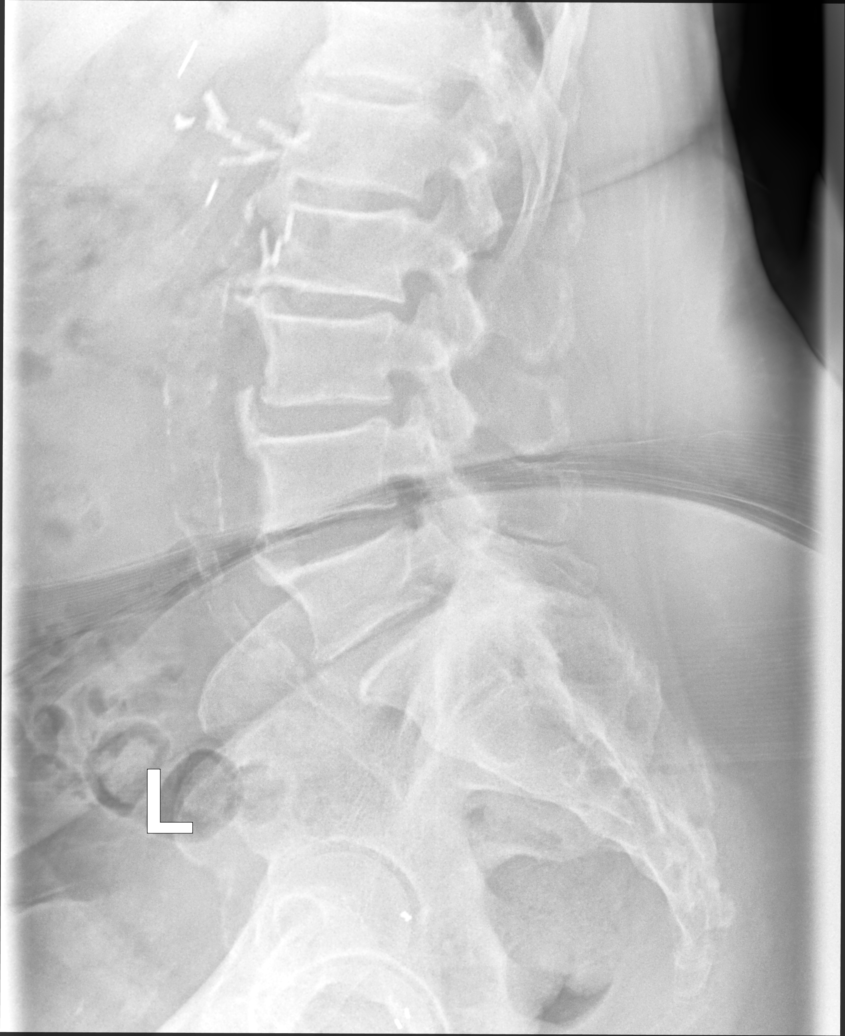

[lumbar spine lat (2 of 2)]
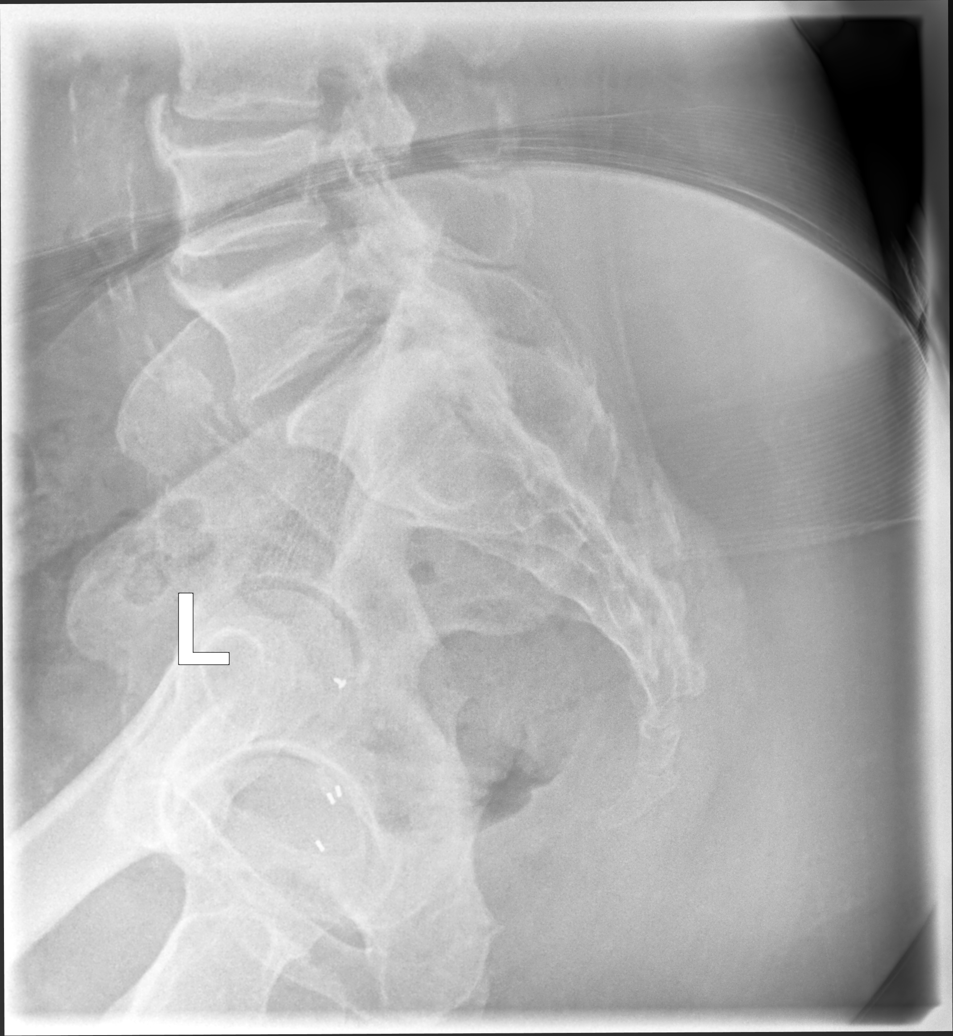

[5 of 5 positions shown; findings below may reference images not displayed]

FINDINGS: No fracture or significant spondylolisthesis is noted. Anterior
osteophyte formation is noted at all levels of the lumbar spine.
Disc spaces appear to be well maintained. Atherosclerosis of
abdominal aorta is noted.
IMPRESSION: Mild degenerative changes as described above. No acute abnormality
seen in the lumbar spine.

Aortic Atherosclerosis ([id number]-TQH.H).

## 2021-08-16 ENCOUNTER — Telehealth: Payer: Self-pay | Admitting: Hematology

## 2021-08-16 NOTE — Telephone Encounter (Signed)
Left message with follow-up appointment per 5/19 los.

## 2021-08-18 NOTE — Telephone Encounter (Signed)
Left message to call back to schedule a tele pre op appt.  

## 2021-08-19 ENCOUNTER — Telehealth: Payer: Self-pay | Admitting: *Deleted

## 2021-08-19 NOTE — Telephone Encounter (Signed)
Pt agreeable to plan of care for tele pre op appt 08/24/21 @ 2 pm. Med rec and consent are done.

## 2021-08-19 NOTE — Telephone Encounter (Signed)
Pt agreeable to plan of care for tele pre op appt 08/24/21 @ 2 pm. Med rec and consent are done.     Patient Consent for Virtual Visit        Aaron Moss has provided verbal consent on 08/19/2021 for a virtual visit (video or telephone).   CONSENT FOR VIRTUAL VISIT FOR:  Aaron Moss  By participating in this virtual visit I agree to the following:  I hereby voluntarily request, consent and authorize [facility] and its employed or contracted physicians, physician assistants, nurse practitioners or other licensed health care professionals (the Practitioner), to provide me with telemedicine health care services (the "Services") as deemed necessary by the treating Practitioner. I acknowledge and consent to receive the Services by the Practitioner via telemedicine. I understand that the telemedicine visit will involve communicating with the Practitioner through live audiovisual communication technology and the disclosure of certain medical information by electronic transmission. I acknowledge that I have been given the opportunity to request an in-person assessment or other available alternative prior to the telemedicine visit and am voluntarily participating in the telemedicine visit.  I understand that I have the right to withhold or withdraw my consent to the use of telemedicine in the course of my care at any time, without affecting my right to future care or treatment, and that the Practitioner or I may terminate the telemedicine visit at any time. I understand that I have the right to inspect all information obtained and/or recorded in the course of the telemedicine visit and may receive copies of available information for a reasonable fee.  I understand that some of the potential risks of receiving the Services via telemedicine include:  Delay or interruption in medical evaluation due to technological equipment failure or disruption; Information transmitted may not be sufficient (e.g.  poor resolution of images) to allow for appropriate medical decision making by the Practitioner; and/or  In rare instances, security protocols could fail, causing a breach of personal health information.  Furthermore, I acknowledge that it is my responsibility to provide information about my medical history, conditions and care that is complete and accurate to the best of my ability. I acknowledge that Practitioner's advice, recommendations, and/or decision may be based on factors not within their control, such as incomplete or inaccurate data provided by me or distortions of diagnostic images or specimens that may result from electronic transmissions. I understand that the practice of medicine is not an exact science and that Practitioner makes no warranties or guarantees regarding treatment outcomes. I acknowledge that a copy of this consent can be made available to me via my patient portal ([facility]), or I can request a printed copy by calling the office of [facility].    I understand that my insurance will be billed for this visit.   I have read or had this consent read to me. I understand the contents of this consent, which adequately explains the benefits and risks of the Services being provided via telemedicine.  I have been provided ample opportunity to ask questions regarding this consent and the Services and have had my questions answered to my satisfaction. I give my informed consent for the services to be provided through the use of telemedicine in my medical care

## 2021-08-22 ENCOUNTER — Other Ambulatory Visit: Payer: Self-pay | Admitting: General Surgery

## 2021-08-22 DIAGNOSIS — C170 Malignant neoplasm of duodenum: Secondary | ICD-10-CM

## 2021-08-24 ENCOUNTER — Ambulatory Visit (INDEPENDENT_AMBULATORY_CARE_PROVIDER_SITE_OTHER): Payer: Medicare Other | Admitting: Nurse Practitioner

## 2021-08-24 DIAGNOSIS — Z01810 Encounter for preprocedural cardiovascular examination: Secondary | ICD-10-CM

## 2021-08-24 NOTE — Progress Notes (Signed)
Virtual Visit via Telephone Note   Because of Aaron Aaron co-morbid illnesses, he is at least at moderate risk for complications without adequate follow up.  This format is felt to be most appropriate for this patient at this time.  The patient did not have access to video technology/had technical difficulties with video requiring transitioning to audio format only (telephone).  All issues noted in this document were discussed and addressed.  No physical exam could be performed with this format.  Please refer to the patient's chart for his consent to telehealth for [facility].  Evaluation Performed:  Preoperative cardiovascular risk assessment _____________   Date:  08/24/2021   Patient ID:  Aaron Aaron, DOB 07/23/1945, MRN [id number] Patient Location:  Home Provider location:   Office  Primary Care Provider:  Biagio Borg, MD Primary Cardiologist:  Aaron Ruths, MD  Chief Complaint / Patient Profile   76 y.o. y/o male with a h/o coronary artery calcification on CT, hypertension, hyperlipidemia, carotid artery stenosis s/p L CEA in 04/2021, TIA, glaucoma, prostate cancer, colon cancer, and who is pending Whipple procedure with Aaron [facility] of [facility] and presents today for telephonic preoperative cardiovascular risk assessment.  Past Medical History    Past Medical History:  Diagnosis Date   Carotid stenosis, left    Colon cancer (Aaron Aaron) 2000   Coronary artery disease    per Dr. Jacalyn Aaron note from 04/13/21   Diverticulosis of colon    GERD (gastroesophageal reflux disease)    Glaucoma    History of chemotherapy 2000   colon cancer   History of radiation therapy 08/06/13- 10/03/13   prostate 7800 cGy 40 sessions, seminal vesicles 5600 cGy 40 sessions   Hyperlipidemia    Hypertension    Left knee DJD    Prostate cancer ([place]) 05/23/2013   gleason 3+4=7, volume 11.5 ml   TIA (transient ischemic attack)    Past Surgical History:  Procedure  Laterality Date   COLON SURGERY  2000   colon cancer   COLONOSCOPY     ENDARTERECTOMY Left 04/25/2021   Procedure: LEFT ENDARTERECTOMY CAROTID;  Surgeon: Aaron Heck, MD;  Location: [facility];  Service: Vascular;  Laterality: Left;   EUS     pancreatic cyst   KNEE ARTHROSCOPY  2007   LT- [facility]   PATCH ANGIOPLASTY Left 04/25/2021   Procedure: PATCH ANGIOPLASTY XENOSURE 1CM X 6CM;  Surgeon: Aaron Heck, MD;  Location: [facility];  Service: Vascular;  Laterality: Left;   PROSTATE BIOPSY  05/23/13   gleason 7, volume 11.5 ml    Allergies  Allergies  Allergen Reactions   Lyrica [Pregabalin] Other (See Comments)    "Just made me feel bad"    History of Present Illness    Aaron Aaron is a 76 y.o. male who presents via audio/video conferencing for a telehealth visit today.  Pt was last seen in cardiology clinic on 04/13/2021 by Dr. Stanford Aaron.  At that time Aaron Aaron was doing well from a cardiac standpoint.  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 He underwent L CEA for carotid artery stenosis in February 2023. The patient is now pending procedure as outlined above. Since his last visit, he has been stable from a cardiac standpoint. He denies chest pain, palpitations, dyspnea, pnd, orthopnea, n, v, dizziness, syncope, edema, weight gain, or early satiety. All other systems reviewed and are otherwise negative except  as noted above. Overall, he reports feeling well and denies any new symptoms or concerns today.   Home Medications    Prior to Admission medications   Medication Sig Start Date End Date Taking? Authorizing Provider  amLODipine (NORVASC) 10 MG tablet Take 1 tablet by mouth once daily 07/29/21   Aaron Borg, MD  aspirin EC 81 MG EC tablet Take 1 tablet (81 mg total) by mouth daily. Swallow whole. 04/20/21   Aaron Riding, MD  atorvastatin (LIPITOR) 40 MG tablet Take 1 tablet by mouth once daily  07/20/21   Aaron Borg, MD  benazepril (LOTENSIN) 40 MG tablet Take 1 tablet by mouth once daily 08/03/21   Aaron Borg, MD  carvedilol (COREG) 12.5 MG tablet Take 12.5 mg by mouth 2 (two) times daily. Patient not taking: Reported on 07/26/2021 07/10/21   [provider]  clopidogrel (PLAVIX) 75 MG tablet Take 1 tablet (75 mg total) by mouth daily. 04/20/21   Aaron Riding, MD  iron polysaccharides (NU-IRON) 150 MG capsule Take 1 capsule (150 mg total) by mouth 2 (two) times daily. 02/17/21   Aaron Borg, MD  pantoprazole (PROTONIX) 40 MG tablet Take 1 tablet (40 mg total) by mouth daily. 02/16/21   Aaron Borg, MD  potassium chloride (KLOR-CON M) 10 MEQ tablet Take 1 tablet (10 mEq total) by mouth daily. 06/23/21   Aaron Merle, MD  traMADol (ULTRAM) 50 MG tablet TAKE 1 TABLET BY MOUTH EVERY 6 HOURS AS NEEDED FOR PAIN 04/30/21   Aaron Borg, MD  Travoprost, BAK Free, (TRAVATAN) 0.004 % SOLN ophthalmic solution Place 1 drop into both eyes at bedtime.    [provider]  VITAMIN D PO Take 1 tablet by mouth daily.    [provider]    Physical Exam    Vital Signs:  Aaron Aaron does not have vital signs available for review today.  Given telephonic nature of communication, physical exam is limited. AAOx3. NAD. Normal affect.  Speech and respirations are unlabored.  Accessory Clinical Findings    None  Assessment & Plan    1.  Preoperative Cardiovascular Risk Assessment:  According to the Revised Cardiac Risk Index (RCRI), his Perioperative Risk of Major Cardiac Event is (%): 0.9. His Functional Capacity in METs is: 7.01 according to the [facility] Activity Status Index (DASI).Therefore, based on ACC/AHA guidelines, patient would be at acceptable risk for the planned procedure without further cardiovascular testing. Patient was advised that should he develop any new symptoms between now and the time of surgery, to please notify our office.  Patient's aspirin and Plavix  are not prescribed by cardiology. Recommendations for holding medications will need to come from prescribing provider. Of note, patient states he is no longer taking Plavix.   A copy of this note will be routed to requesting surgeon.  Time:   Today, I have spent 6 minutes with the patient with telehealth technology discussing medical history, symptoms, and management plan.     Lenna Sciara, NP  08/24/2021, 10:10 AM

## 2021-08-26 ENCOUNTER — Other Ambulatory Visit: Payer: Self-pay

## 2021-08-26 ENCOUNTER — Encounter: Payer: Self-pay | Admitting: Adult Health

## 2021-08-26 ENCOUNTER — Inpatient Hospital Stay (HOSPITAL_BASED_OUTPATIENT_CLINIC_OR_DEPARTMENT_OTHER): Payer: Medicare Other | Admitting: Adult Health

## 2021-08-26 ENCOUNTER — Inpatient Hospital Stay: Payer: Medicare Other | Attending: Nurse Practitioner

## 2021-08-26 ENCOUNTER — Inpatient Hospital Stay: Payer: Medicare Other

## 2021-08-26 VITALS — BP 161/63 | HR 74 | Temp 98.1°F | Resp 18 | Ht 66.0 in | Wt 215.5 lb

## 2021-08-26 VITALS — BP 152/64 | HR 63 | Temp 98.4°F | Resp 16

## 2021-08-26 DIAGNOSIS — C61 Malignant neoplasm of prostate: Secondary | ICD-10-CM

## 2021-08-26 DIAGNOSIS — Z8546 Personal history of malignant neoplasm of prostate: Secondary | ICD-10-CM | POA: Diagnosis not present

## 2021-08-26 DIAGNOSIS — C170 Malignant neoplasm of duodenum: Secondary | ICD-10-CM

## 2021-08-26 DIAGNOSIS — Z5112 Encounter for antineoplastic immunotherapy: Secondary | ICD-10-CM | POA: Diagnosis present

## 2021-08-26 DIAGNOSIS — Z87891 Personal history of nicotine dependence: Secondary | ICD-10-CM | POA: Diagnosis not present

## 2021-08-26 DIAGNOSIS — D500 Iron deficiency anemia secondary to blood loss (chronic): Secondary | ICD-10-CM

## 2021-08-26 DIAGNOSIS — Z79899 Other long term (current) drug therapy: Secondary | ICD-10-CM | POA: Insufficient documentation

## 2021-08-26 DIAGNOSIS — D508 Other iron deficiency anemias: Secondary | ICD-10-CM | POA: Insufficient documentation

## 2021-08-26 LAB — IRON AND IRON BINDING CAPACITY (CC-WL,HP ONLY)
Iron: 19 ug/dL — ABNORMAL LOW (ref 45–182)
Saturation Ratios: 8 % — ABNORMAL LOW (ref 17.9–39.5)
TIBC: 239 ug/dL — ABNORMAL LOW (ref 250–450)
UIBC: 220 ug/dL (ref 117–376)

## 2021-08-26 LAB — CBC WITH DIFFERENTIAL (CANCER CENTER ONLY)
Abs Immature Granulocytes: 0.02 10*3/uL (ref 0.00–0.07)
Basophils Absolute: 0 10*3/uL (ref 0.0–0.1)
Basophils Relative: 0 %
Eosinophils Absolute: 0.2 10*3/uL (ref 0.0–0.5)
Eosinophils Relative: 3 %
HCT: 32.9 % — ABNORMAL LOW (ref 39.0–52.0)
Hemoglobin: 10.3 g/dL — ABNORMAL LOW (ref 13.0–17.0)
Immature Granulocytes: 0 %
Lymphocytes Relative: 18 %
Lymphs Abs: 1.3 10*3/uL (ref 0.7–4.0)
MCH: 24.1 pg — ABNORMAL LOW (ref 26.0–34.0)
MCHC: 31.3 g/dL (ref 30.0–36.0)
MCV: 76.9 fL — ABNORMAL LOW (ref 80.0–100.0)
Monocytes Absolute: 0.5 10*3/uL (ref 0.1–1.0)
Monocytes Relative: 7 %
Neutro Abs: 5 10*3/uL (ref 1.7–7.7)
Neutrophils Relative %: 72 %
Platelet Count: 252 10*3/uL (ref 150–400)
RBC: 4.28 MIL/uL (ref 4.22–5.81)
RDW: 17.8 % — ABNORMAL HIGH (ref 11.5–15.5)
WBC Count: 6.9 10*3/uL (ref 4.0–10.5)
nRBC: 0 % (ref 0.0–0.2)

## 2021-08-26 LAB — CMP (CANCER CENTER ONLY)
ALT: 11 U/L (ref 0–44)
AST: 13 U/L — ABNORMAL LOW (ref 15–41)
Albumin: 3.5 g/dL (ref 3.5–5.0)
Alkaline Phosphatase: 95 U/L (ref 38–126)
Anion gap: 5 (ref 5–15)
BUN: 14 mg/dL (ref 8–23)
CO2: 30 mmol/L (ref 22–32)
Calcium: 9.2 mg/dL (ref 8.9–10.3)
Chloride: 111 mmol/L (ref 98–111)
Creatinine: 0.99 mg/dL (ref 0.61–1.24)
GFR, Estimated: >60 mL/min (ref >60)
Glucose, Bld: 122 mg/dL — ABNORMAL HIGH (ref 70–99)
Potassium: 3.3 mmol/L — ABNORMAL LOW (ref 3.5–5.1)
Sodium: 146 mmol/L — ABNORMAL HIGH (ref 135–145)
Total Bilirubin: 0.6 mg/dL (ref 0.3–1.2)
Total Protein: 6.7 g/dL (ref 6.5–8.1)

## 2021-08-26 LAB — FERRITIN: Ferritin: 30 ng/mL (ref 24–336)

## 2021-08-26 LAB — TSH: TSH: 0.655 u[IU]/mL (ref 0.350–4.500)

## 2021-08-26 MED ORDER — SODIUM CHLORIDE 0.9 % IV SOLN
200.0000 mg | Freq: Once | INTRAVENOUS | Status: AC
  Administered 2021-08-26: 200 mg via INTRAVENOUS
  Filled 2021-08-26: qty 8

## 2021-08-26 MED ORDER — SODIUM CHLORIDE 0.9 % IV SOLN
300.0000 mg | Freq: Once | INTRAVENOUS | Status: AC
  Administered 2021-08-26: 300 mg via INTRAVENOUS
  Filled 2021-08-26: qty 300

## 2021-08-26 MED ORDER — SODIUM CHLORIDE 0.9 % IV SOLN: Freq: Once | INTRAVENOUS | Status: AC

## 2021-08-26 NOTE — Progress Notes (Signed)
[facility] Follow up:    Aaron Borg, MD [place] [address]   DIAGNOSIS:  Cancer Staging  Adenocarcinoma of duodenum [facility]) Staging form: Small Intestine - Adenocarcinoma, AJCC 8th Edition - Clinical stage from 03/18/2021: Stage IIIA (cTX, cN1, cM0) - Signed by [name], MD on 06/22/2021 Stage prefix: Initial diagnosis   SUMMARY OF ONCOLOGIC HISTORY: Oncology History  Adenocarcinoma of duodenum ([place])  03/01/2021 Imaging   EXAM: ABDOMEN ULTRASOUND COMPLETE  IMPRESSION: 1. Simple appearing right renal cyst. 2. Otherwise negative abdominal ultrasound   03/18/2021 Procedure   Upper Endoscopy/Colonoscopy, Dr. Loletha Carrow  Upper Endoscopy Impression: - Normal esophagus. - A single submucosal papule (nodule) found in the stomach. - Duodenal mass. Biopsied. Concerning for malignancy.  Colonoscopy Impression: - Diverticulosis in the entire examined colon. - The examined portion of the ileum was normal. - Patent end-to-end colo-colonic anastomosis, characterized by healthy appearing mucosa. - Internal hemorrhoids. - No specimens collected.   03/18/2021 Initial Biopsy   Diagnosis Duodenum, Biopsy - INVASIVE MODERATE TO POORLY DIFFERENTIATED ADENOCARCINOMA   03/18/2021 Cancer Staging   Staging form: Small Intestine - Adenocarcinoma, AJCC 8th Edition - Clinical stage from 03/18/2021: Stage IIIA (cTX, cN1, cM0) - Signed by [name], MD on 06/22/2021 Stage prefix: Initial diagnosis   03/22/2021 Initial Diagnosis   Adenocarcinoma of duodenum ([place])   04/01/2021 Imaging   EXAM: CT CHEST, ABDOMEN, AND PELVIS WITH CONTRAST  IMPRESSION: 1. Circumferential wall thickening of the first portion of the duodenum, in keeping with known primary malignancy. 2. Enlarged, hypodense lymph nodes anterior to the pancreatic head and in the portacaval station, concerning for nodal metastatic disease. 3. No other evidence of metastatic disease in the chest,  abdomen, or pelvis. 4. Incidental note of a fluid attenuation lesion within the proximal pancreatic body measuring 1.9 x 1.4 cm, with prominence of the pancreatic duct distally, up to 0.6 cm. This lesion was present on remote prior examination dated 05/04/2009, and has slightly increased in size over a long period of time. This is consistent with a small IPMN and benign given indolent behavior over greater than 10 years. 5. Background of very fine centrilobular pulmonary nodules, most concentrated in the lung apices, most commonly seen in smoking-related respiratory bronchiolitis. 6. Coronary artery disease.   Aortic Atherosclerosis (ICD10-I70.0).    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 Of note, a variant of uncertain significance was detected in the ATM (p.A869G), BLM (c.800-3T>G), and SDHA (p.V632F) genes. Report date is 04/27/2021.  The CancerNext-Expanded gene panel offered by [facility] and includes sequencing, rearrangement, and RNA analysis for the following 77 genes: AIP, ALK, APC, ATM, AXIN2, BAP1, BARD1, BLM, BMPR1A, BRCA1, BRCA2, BRIP1, CDC73, CDH1, CDK4, CDKN1B, CDKN2A, CHEK2, CTNNA1, DICER1, FANCC, FH, FLCN, GALNT12, KIF1B, LZTR1, MAX, MEN1, MET, MLH1, MSH2, MSH3, MSH6, MUTYH, NBN, NF1, NF2, NTHL1, PALB2, PHOX2B, PMS2, POT1, PRKAR1A, PTCH1, PTEN, RAD51C, RAD51D, RB1, RECQL, RET, SDHA, SDHAF2, SDHB, SDHC, SDHD, SMAD4, SMARCA4, SMARCB1, SMARCE1, STK11, SUFU, TMEM127, TP53, TSC1, TSC2, VHL and XRCC2 (sequencing and deletion/duplication); EGFR, EGLN1, HOXB13, KIT, MITF, PDGFRA, POLD1, and POLE (sequencing only); EPCAM and GREM1 (deletion/duplication only).    05/11/2021 -  Chemotherapy   Patient is on Treatment Plan : COLORECTAL Pembrolizumab (200) q21d       CURRENT THERAPY: Pembrolizumab  INTERVAL HISTORY: SEVERUS BRODZINSKI 76 y.o. male returns for follow-up of his  duodenal  carcinoma he is currently on treatment with pembrolizumab.  He receives this every 3 weeks with good tolerance.  He also receives intermittent Venofer.  He is meeting with Dr. Barry Dienes and surgery will potentially be scheduled in the next few weeks.   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[place]) 04/25/2021   TIA (transient ischemic attack) 04/18/2021   Left carotid artery stenosis 04/18/2021   Fall at home, initial encounter 04/18/2021   Adenocarcinoma of duodenum ([place]) 03/22/2021   Loss of weight 03/08/2021   Iron deficiency anemia 02/16/2021   Epigastric pain 02/16/2021   Abnormal LFTs 02/16/2021   Leg swelling 11/16/2020   Anemia 11/16/2020   Hypokalemia 07/07/2020   Vitamin D deficiency 07/07/2020   Dog bite 06/26/2020   Change in bowel habits 11/01/2019   Chronic right-sided low back pain without sciatica 07/02/2019   Skin lesion 12/24/2018   Cough 05/03/2018   Hyperglycemia 05/03/2018   Left ankle sprain 11/01/2017   Left knee pain 12/05/2016   Allergic rhinitis 07/29/2015   CAP (community acquired pneumonia) 04/01/2015   Abnormal urine findings 04/01/2015   UTI (urinary tract infection) 08/21/2013   Malignant neoplasm of prostate ([place]) 07/02/2013   Bilateral knee pain 08/29/2011   Bladder neck obstruction 08/29/2011   Erectile dysfunction 06/20/2011   Encounter for well adult exam with abnormal findings 12/14/2010   POSTHERPETIC NEURALGIA 06/08/2009   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  MEDICAL HISTORY: Past Medical History:  Diagnosis Date   Carotid stenosis, left     Colon cancer ([place]) 2000   Coronary artery disease    per Dr. 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, volume 11.5 ml   TIA (transient ischemic attack)     SURGICAL HISTORY: Past Surgical History:  Procedure Laterality Date   COLON SURGERY  2000   colon cancer   COLONOSCOPY     ENDARTERECTOMY Left 04/25/2021   Procedure: LEFT ENDARTERECTOMY CAROTID;  Surgeon: Marty Heck, MD;  Location: [facility];  Service: Vascular;  Laterality: Left;   EUS     pancreatic cyst   KNEE ARTHROSCOPY  2007   LT- [facility]   PATCH ANGIOPLASTY Left 04/25/2021   Procedure: PATCH ANGIOPLASTY XENOSURE 1CM X 6CM;  Surgeon: Marty Heck, MD;  Location: [facility] OR;  Service: Vascular;  Laterality: Left;   PROSTATE BIOPSY  05/23/13   gleason 7, volume 11.5 ml    SOCIAL HISTORY: Social History   Socioeconomic History   Marital status: Married    Spouse name: Not on file   Number of children: 4   Years of education: Not on file   Highest education level: Not on file  Occupational History   Not on file  Tobacco Use   Smoking status: Former    Packs/day: 1.00    Years: 22.00    Total pack years: 22.00    Types: Cigarettes    Quit date: 03/20/1988    Years since quitting: 33.4   Smokeless tobacco: Never  Vaping Use   Vaping Use: Never used  Substance and Sexual Activity   Alcohol use: Not Currently    Comment: rare   Drug use: No   Sexual activity: Not Currently  Other Topics Concern   Not on file  Social History Narrative   Not on file   Social Determinants of Health   Financial Resource Strain: Low Risk  (08/11/2020)   Overall Financial Resource  Strain (CARDIA)    Difficulty of Paying Living Expenses: Not hard at all  Food Insecurity: No Food Insecurity (08/11/2020)   Hunger Vital Sign    Worried About Running Out of Food in the Last Year: Never true    [place] in the Last Year: Never true  Transportation Needs: No Transportation Needs (08/11/2020)   PRAPARE - Hydrologist (Medical): No    Lack of Transportation (Non-Medical): No  Physical Activity: Sufficiently Active (08/11/2020)   Exercise Vital Sign    Days of Exercise per Week: 3 days    Minutes of Exercise per Session: 60 min  Stress: No Stress Concern Present (08/11/2020)   [facility]    Feeling of Stress : Not at all  Social Connections: [place] (08/11/2020)   Social Connection and Isolation Panel [NHANES]    Frequency of Communication with Friends and Family: More than three times a week    Frequency of Social Gatherings with Friends and Family: More than three times a week    Attends Religious Services: 1 to 4 times per year    Active Member of [employer] or Organizations: Yes    Attends Archivist Meetings: 1 to 4 times per year    Marital Status: Married  Human resources officer Violence: Not At Risk (05/03/2018)   Humiliation, Afraid, Rape, and Kick questionnaire    Fear of Current or Ex-Partner: No    Emotionally Abused: No    Physically Abused: No    Sexually Abused: No    FAMILY HISTORY: Family History  Problem Relation Age of Onset   Cancer Mother        Liver? bladder?, dx. late 84s   Colon cancer Sister 57   Cancer Brother        Lung   Diabetes Brother    Cancer Maternal Aunt        unknown type   Breast cancer Maternal Grandmother        dx. >50   Cancer Daughter 56       colon   Esophageal cancer Neg Hx    Rectal cancer Neg Hx    Stomach cancer Neg Hx     Review of Systems  Constitutional:  Negative for appetite change, chills,  fatigue, fever and unexpected weight change.  HENT:   Negative for hearing loss, lump/mass and trouble swallowing.   Eyes:  Negative for eye problems and icterus.  Respiratory:  Negative for chest tightness, cough and shortness of breath.   Cardiovascular:  Negative for chest pain, leg swelling and palpitations.  Gastrointestinal:  Negative for abdominal distention, abdominal pain, constipation, diarrhea, nausea and vomiting.  Endocrine: Negative for hot flashes.  Genitourinary:  Negative  for difficulty urinating.   Musculoskeletal:  Negative for arthralgias.  Skin:  Negative for itching and rash.  Neurological:  Negative for dizziness, extremity weakness, headaches and numbness.  Hematological:  Negative for adenopathy. Does not bruise/bleed easily.  Psychiatric/Behavioral:  Negative for depression. The patient is not nervous/anxious.       PHYSICAL EXAMINATION  ECOG PERFORMANCE STATUS: 1 - Symptomatic but completely ambulatory  Vitals:   08/26/21 1211  BP: (!) 161/63  Pulse: 74  Resp: 18  Temp: 98.1 F (36.7 C)  SpO2: 100%    Physical Exam Constitutional:      General: He is not in acute distress.    Appearance: Normal appearance. He is not toxic-appearing.  HENT:     Head: Normocephalic and atraumatic.  Eyes:     General: No scleral icterus. Cardiovascular:     Rate and Rhythm: Normal rate and regular rhythm.     Pulses: Normal pulses.     Heart sounds: Normal heart sounds.  Pulmonary:     Effort: Pulmonary effort is normal.     Breath sounds: Normal breath sounds.  Abdominal:     General: Abdomen is flat. Bowel sounds are normal. There is no distension.     Palpations: Abdomen is soft.     Tenderness: There is no abdominal tenderness.  Musculoskeletal:        General: No swelling.     Cervical back: Neck supple.  Lymphadenopathy:     Cervical: No cervical adenopathy.  Skin:    General: Skin is warm and dry.     Findings: No rash.  Neurological:      General: No focal deficit present.     Mental Status: He is alert.  Psychiatric:        Mood and Affect: Mood normal.        Behavior: Behavior normal.     LABORATORY DATA:  CBC    Component Value Date/Time   WBC 6.9 08/26/2021 1146   WBC 9.6 04/26/2021 0420   RBC 4.28 08/26/2021 1146   HGB 10.3 (L) 08/26/2021 1146   HCT 32.9 (L) 08/26/2021 1146   PLT 252 08/26/2021 1146   MCV 76.9 (L) 08/26/2021 1146   MCH 24.1 (L) 08/26/2021 1146   MCHC 31.3 08/26/2021 1146   RDW 17.8 (H) 08/26/2021 1146   LYMPHSABS 1.3 08/26/2021 1146   MONOABS 0.5 08/26/2021 1146   EOSABS 0.2 08/26/2021 1146   BASOSABS 0.0 08/26/2021 1146    CMP     Component Value Date/Time   NA 146 (H) 08/26/2021 1146   K 3.3 (L) 08/26/2021 1146   CL 111 08/26/2021 1146   CO2 30 08/26/2021 1146   GLUCOSE 122 (H) 08/26/2021 1146   BUN 14 08/26/2021 1146   CREATININE 0.99 08/26/2021 1146   CALCIUM 9.2 08/26/2021 1146   PROT 6.7 08/26/2021 1146   ALBUMIN 3.5 08/26/2021 1146   AST 13 (L) 08/26/2021 1146   ALT 11 08/26/2021 1146   ALKPHOS 95 08/26/2021 1146   BILITOT 0.6 08/26/2021 1146   GFRNONAA >60 08/26/2021 1146   GFRAA >60 02/24/2019 0207      ASSESSMENT and THERAPY PLAN:   Adenocarcinoma of duodenum (HCC) Kalif Kattner is a 76 year old man with diagnosed duodenal carcinoma currently on pembrolizumab given every 3 weeks.  He is tolerating this treatment well.  His labs thus far are stable CMP is currently pending at the time of appointment completion and he will proceed with his treatment today so long as  his labs are within parameters for administration.  Surgery has not yet been scheduled however should be in the next few weeks.  I have requested lab, follow-up with Dr. Morey Hummingbird, and pembrolizumab in 3 weeks time and reassured him and his wife that this could be moved around as needed based on the surgery schedule.   All questions were answered. The patient knows to call the clinic with any problems,  questions or concerns. We can certainly see the patient much sooner if necessary.  Total encounter time:20 minutes*in face-to-face visit time, chart review, lab review, care coordination, order entry, and documentation of the encounter time.  Wilber Bihari, NP 08/26/21 12:42 PM Medical Oncology and Hematology [facility] [address], [place] [address] Tel. [phone]    Fax. [phone]  *Total Encounter Time as defined by the Centers for Medicare and Medicaid Services includes, in addition to the face-to-face time of a patient visit (documented in the note above) non-face-to-face time: obtaining and reviewing outside history, ordering and reviewing medications, tests or procedures, care coordination (communications with other health care professionals or caregivers) and documentation in the medical record.

## 2021-08-26 NOTE — Patient Instructions (Signed)
[facility]  Discharge Instructions: Thank you for choosing [facility] to provide your oncology and hematology care.   If you have a lab appointment with the [facility], please go directly to the [facility] and check in at the registration area.   Wear comfortable clothing and clothing appropriate for easy access to any Portacath or PICC line.   We strive to give you quality time with your provider. You may need to reschedule your appointment if you arrive late (15 or more minutes).  Arriving late affects you and other patients whose appointments are after yours.  Also, if you miss three or more appointments without notifying the office, you may be dismissed from the clinic at the provider's discretion.      For prescription refill requests, have your pharmacy contact our office and allow 72 hours for refills to be completed.    Today you received the following chemotherapy and/or immunotherapy agent: Pembrolizumab (Keytruda)   To help prevent nausea and vomiting after your treatment, we encourage you to take your nausea medication as directed.  BELOW ARE SYMPTOMS THAT SHOULD BE REPORTED IMMEDIATELY: *FEVER GREATER THAN 100.4 F (38 C) OR HIGHER *CHILLS OR SWEATING *NAUSEA AND VOMITING THAT IS NOT CONTROLLED WITH YOUR NAUSEA MEDICATION *UNUSUAL SHORTNESS OF BREATH *UNUSUAL BRUISING OR BLEEDING *URINARY PROBLEMS (pain or burning when urinating, or frequent urination) *BOWEL PROBLEMS (unusual diarrhea, constipation, pain near the anus) TENDERNESS IN MOUTH AND THROAT WITH OR WITHOUT PRESENCE OF ULCERS (sore throat, sores in mouth, or a toothache) UNUSUAL RASH, SWELLING OR PAIN  UNUSUAL VAGINAL DISCHARGE OR ITCHING   Items with * indicate a potential emergency and should be followed up as soon as possible or go to the Emergency Department if any problems should occur.  Please show the CHEMOTHERAPY ALERT CARD or IMMUNOTHERAPY ALERT CARD at  check-in to the Emergency Department and triage nurse.  Should you have questions after your visit or need to cancel or reschedule your appointment, please contact [facility]  Dept: [phone]  and follow the prompts.  Office hours are 8:00 a.m. to 4:30 p.m. Monday - Friday. Please note that voicemails left after 4:00 p.m. may not be returned until the following business day.  We are closed weekends and major holidays. You have access to a nurse at all times for urgent questions. Please call the main number to the clinic Dept: [phone] and follow the prompts.   For any non-urgent questions, you may also contact your provider using MyChart. We now offer e-Visits for anyone 67 and older to request care online for non-urgent symptoms. For details visit [email or web address].   Also download the MyChart app! Go to the app store, search "MyChart", open the app, select [facility], and log in with your MyChart username and password.  Due to Covid, a mask is required upon entering the hospital/clinic. If you do not have a mask, one will be given to you upon arrival. For doctor visits, patients may have 1 support person aged 30 or older with them. For treatment visits, patients cannot have anyone with them due to current Covid guidelines and our immunocompromised population.   Iron Sucrose Injection What is this medication? IRON SUCROSE (EYE ern SOO krose) treats low levels of iron (iron deficiency anemia) in people with kidney disease. Iron is a mineral that plays an important role in making red blood cells, which carry oxygen from your lungs to the rest of your  body. This medicine may be used for other purposes; ask your health care provider or pharmacist if you have questions. COMMON BRAND NAME(S): Venofer What should I tell my care team before I take this medication? They need to know if you have any of these conditions: Anemia not caused by low iron levels Heart  disease High levels of iron in the blood Kidney disease Liver disease An unusual or allergic reaction to iron, other medications, foods, dyes, or preservatives Pregnant or trying to get pregnant Breast-feeding How should I use this medication? This medication is for infusion into a vein. It is given in a hospital or clinic setting. Talk to your care team about the use of this medication in children. While this medication may be prescribed for children as young as 2 years for selected conditions, precautions do apply. Overdosage: If you think you have taken too much of this medicine contact a poison control center or emergency room at once. NOTE: This medicine is only for you. Do not share this medicine with others. What if I miss a dose? It is important not to miss your dose. Call your care team if you are unable to keep an appointment. What may interact with this medication? Do not take this medication with any of the following: Deferoxamine Dimercaprol Other iron products This medication may also interact with the following: Chloramphenicol Deferasirox This list may not describe all possible interactions. Give your health care provider a list of all the medicines, herbs, non-prescription drugs, or dietary supplements you use. Also tell them if you smoke, drink alcohol, or use illegal drugs. Some items may interact with your medicine. What should I watch for while using this medication? Visit your care team regularly. Tell your care team if your symptoms do not start to get better or if they get worse. You may need blood work done while you are taking this medication. You may need to follow a special diet. Talk to your care team. Foods that contain iron include: whole grains/cereals, dried fruits, beans, or peas, leafy green vegetables, and organ meats (liver, kidney). What side effects may I notice from receiving this medication? Side effects that you should report to your care team as  soon as possible: Allergic reactions--skin rash, itching, hives, swelling of the face, lips, tongue, or throat Low blood pressure--dizziness, feeling faint or lightheaded, blurry vision Shortness of breath Side effects that usually do not require medical attention (report to your care team if they continue or are bothersome): Flushing Headache Joint pain Muscle pain Nausea Pain, redness, or irritation at injection site This list may not describe all possible side effects. Call your doctor for medical advice about side effects. You may report side effects to FDA at 1-800-FDA-1088. Where should I keep my medication? This medication is given in a hospital or clinic and will not be stored at home. NOTE: This sheet is a summary. It may not cover all possible information. If you have questions about this medicine, talk to your doctor, pharmacist, or health care provider.  2023 Elsevier/Gold Standard (2020-07-30 00:00:00)

## 2021-08-26 NOTE — Assessment & Plan Note (Signed)
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  He is tolerating this treatment well.  His labs thus far are stable CMP is currently pending at the time of appointment completion and he will proceed with his treatment today so long as his labs are within parameters for administration.  Surgery has not yet been scheduled however should be in the next few weeks.  I have requested lab, follow-up with Dr. Morey Hummingbird, and pembrolizumab in 3 weeks time and reassured him and his wife that this could be moved around as needed based on the surgery schedule.

## 2021-08-27 LAB — T4: T4, Total: 8 ug/dL (ref 4.5–12.0)

## 2021-08-29 ENCOUNTER — Telehealth: Payer: Self-pay | Admitting: Adult Health

## 2021-08-29 NOTE — Telephone Encounter (Signed)
Scheduled appointment per 6/9 los. Patient was given a calendar by his infusion nurse Junius Creamer.

## 2021-08-31 ENCOUNTER — Other Ambulatory Visit: Payer: Self-pay | Admitting: Internal Medicine

## 2021-09-09 ENCOUNTER — Telehealth: Payer: Self-pay

## 2021-09-09 ENCOUNTER — Other Ambulatory Visit: Payer: Self-pay

## 2021-09-15 ENCOUNTER — Telehealth: Payer: Self-pay

## 2021-09-15 ENCOUNTER — Other Ambulatory Visit: Payer: Self-pay

## 2021-09-15 ENCOUNTER — Encounter (HOSPITAL_COMMUNITY): Payer: Self-pay | Admitting: Emergency Medicine

## 2021-09-15 ENCOUNTER — Emergency Department (HOSPITAL_COMMUNITY)
Admission: EM | Admit: 2021-09-15 | Discharge: 2021-09-16 | Disposition: A | Discharge status: Home / Self Care | Payer: Medicare Other | Attending: Emergency Medicine | Admitting: Emergency Medicine

## 2021-09-15 DIAGNOSIS — Z7982 Long term (current) use of aspirin: Secondary | ICD-10-CM | POA: Diagnosis not present

## 2021-09-15 DIAGNOSIS — Z8546 Personal history of malignant neoplasm of prostate: Secondary | ICD-10-CM | POA: Diagnosis not present

## 2021-09-15 DIAGNOSIS — J029 Acute pharyngitis, unspecified: Secondary | ICD-10-CM | POA: Diagnosis present

## 2021-09-15 DIAGNOSIS — Z7902 Long term (current) use of antithrombotics/antiplatelets: Secondary | ICD-10-CM | POA: Diagnosis not present

## 2021-09-15 DIAGNOSIS — U071 COVID-19: Secondary | ICD-10-CM | POA: Insufficient documentation

## 2021-09-15 LAB — SARS CORONAVIRUS 2 BY RT PCR: SARS Coronavirus 2 by RT PCR: POSITIVE — AB

## 2021-09-15 MED ORDER — SODIUM CHLORIDE 0.9 % IV SOLN: Freq: Once | INTRAVENOUS | Status: AC

## 2021-09-15 NOTE — ED Triage Notes (Signed)
Pt BIB EMS from home with c/o fever and malaise x 2 days. Pt highest temp at home was 102, current temp 99 in triage. Pt + on home covid test.

## 2021-09-15 NOTE — ED Provider Notes (Signed)
[facility] DEPT Provider Note   CSN: [id number] Arrival date & time: 09/15/21  2122     History  Chief Complaint  Patient presents with   Fatigue    Aaron Moss is a 75 y.o. male who presents emergency department chief complaint of URI symptoms.  Patient states that he has had 3 days of sore throat, malaise, fatigue, cough.  Today he felt extremely fatigued today, tested positive for COVID-19 at home and was urged to come to the emergency department by his wife.  He denies shortness of breath, severe arthralgias or myalgias.  He is currently under treatment for adenocarcinoma and has a history of prostate cancer.  HPI     Home Medications Prior to Admission medications   Medication Sig Start Date End Date Taking? Authorizing Provider  molnupiravir EUA (LAGEVRIO) 200 MG CAPS capsule Take 4 capsules (800 mg total) by mouth 2 (two) times daily for 5 days. 09/16/21 09/21/21 Yes Margarita Mail, PA-C  amLODipine (NORVASC) 10 MG tablet Take 1 tablet by mouth once daily 07/29/21   Biagio Borg, MD  aspirin EC 81 MG EC tablet Take 1 tablet (81 mg total) by mouth daily. Swallow whole. 04/20/21   Mercy Riding, MD  atorvastatin (LIPITOR) 40 MG tablet Take 1 tablet by mouth once daily 07/20/21   Biagio Borg, MD  benazepril (LOTENSIN) 40 MG tablet Take 1 tablet by mouth once daily 08/03/21   Biagio Borg, MD  carvedilol (COREG) 12.5 MG tablet Take 12.5 mg by mouth 2 (two) times daily. Patient not taking: Reported on 07/26/2021 07/10/21   [provider]  clopidogrel (PLAVIX) 75 MG tablet Take 1 tablet (75 mg total) by mouth daily. 04/20/21   Mercy Riding, MD  iron polysaccharides (NU-IRON) 150 MG capsule Take 1 capsule (150 mg total) by mouth 2 (two) times daily. 02/17/21   Biagio Borg, MD  pantoprazole (PROTONIX) 40 MG tablet Take 1 tablet (40 mg total) by mouth daily. 02/16/21   Biagio Borg, MD  potassium chloride (KLOR-CON M) 10 MEQ tablet Take 1 tablet  (10 mEq total) by mouth daily. 06/23/21   [name], MD  traMADol (ULTRAM) 50 MG tablet TAKE 1 TABLET BY MOUTH EVERY 6 HOURS AS NEEDED FOR PAIN 08/31/21   Biagio Borg, MD  Travoprost, BAK Free, (TRAVATAN) 0.004 % SOLN ophthalmic solution Place 1 drop into both eyes at bedtime.    [provider]  VITAMIN D PO Take 1 tablet by mouth daily.    [provider]      Allergies    Lyrica [pregabalin]    Review of Systems   Review of Systems  Physical Exam Updated Vital Signs BP (!) 149/55   Pulse 87   Temp 99 F (37.2 C) (Oral)   Resp 14   Ht '5\' 6"'$  (1.676 m)   Wt 96.6 kg   SpO2 100%   BMI 34.38 kg/m  Physical Exam Vitals and nursing note reviewed.  Constitutional:      General: He is not in acute distress.    Appearance: He is well-developed. He is not diaphoretic.  HENT:     Head: Normocephalic and atraumatic.  Eyes:     General: No scleral icterus.    Conjunctiva/sclera: Conjunctivae normal.  Cardiovascular:     Rate and Rhythm: Normal rate and regular rhythm.     Heart sounds: Normal heart sounds.  Pulmonary:     Effort: Pulmonary effort  is normal. No respiratory distress.     Breath sounds: Normal breath sounds.  Abdominal:     Palpations: Abdomen is soft.     Tenderness: There is no abdominal tenderness.  Musculoskeletal:     Cervical back: Normal range of motion and neck supple.  Skin:    General: Skin is warm and dry.  Neurological:     Mental Status: He is alert.  Psychiatric:        Behavior: Behavior normal.     ED Results / Procedures / Treatments   Labs (all labs ordered are listed, but only abnormal results are displayed) Labs Reviewed  SARS CORONAVIRUS 2 BY RT PCR - Abnormal; Notable for the following components:      Result Value   SARS Coronavirus 2 by RT PCR POSITIVE (*)    All other components within normal limits  CBC - Abnormal; Notable for the following components:   Hemoglobin 10.0 (*)    HCT 32.6 (*)    MCV 76.9 (*)     MCH 23.6 (*)    RDW 17.8 (*)    All other components within normal limits  BASIC METABOLIC PANEL - Abnormal; Notable for the following components:   Potassium 3.1 (*)    Calcium 8.3 (*)    All other components within normal limits  URINALYSIS, ROUTINE W REFLEX MICROSCOPIC - Abnormal; Notable for the following components:   Ketones, ur 5 (*)    All other components within normal limits    EKG None  Radiology No results found.  Procedures Procedures    Medications Ordered in ED Medications  0.9 %  sodium chloride infusion (0 mLs Intravenous Stopped 09/16/21 0055)    ED Course/ Medical Decision Making/ A&P                           Medical Decision Making This patient presents to the ED for concern of URI, this involves an extensive number of treatment options, and is a complaint that carries with it a high risk of complications and morbidity.  The differential diagnosis includes pneumonia, URI, other viral illness   Co morbidities that complicate the patient evaluation      hx of adenocarcinoma and active tx for cancer   Additional history obtained:  Additional history obtained from review of EMR    Lab Tests: I Ordered, and personally interpreted labs.  The pertinent results include:  Anemia at baseline, + COVID 19 infection No other acute findings   Medicines ordered and prescription drug management:  I ordered fluid bolus Reevaluation of the patient after these medicines showed that the patient improved I have reviewed the patients home medicines and have made adjustments as needed   Test Considered:       CXR- however patient has NO wheezing or sob, normal 02 saturation   Problem List / ED Course:       Acute Covid 19 infection   Reevaluation:  After the interventions noted above, I reevaluated the patient and found that they have :improved   Social Determinants of Health:       good support system, close OP  f/u   Dispostion:  After consideration of the diagnostic results and the patients response to treatment, I feel that the patient would benefit from discharge with monulpirovir.    Amount and/or Complexity of Data Reviewed Labs: ordered. Decision-making details documented in ED Course.  Risk Prescription drug management.  Final Clinical Impression(s) / ED Diagnoses Final diagnoses:  COVID-19 virus infection    Rx / DC Orders ED Discharge Orders          Ordered    molnupiravir EUA (LAGEVRIO) 200 MG CAPS capsule  2 times daily        09/16/21 0045              Margarita Mail, PA-C 09/18/21 [place], Grayce Sessions, MD 09/18/21 2304

## 2021-09-15 NOTE — Telephone Encounter (Signed)
Patient called and stated that he tested positive today for COVID after feeling bad with a cough and nasal congestion for a few days prior. Denies having any fevers or SOB.  Patient is scheduled for infusion, office visit and labs on 6/30.  This nurse advised of [facility] policy, that he cannot return to facility until after 10 days from positive test. Advised that appointments will be cancelled and someone will reach out to him to reschedule. Patient acknowledged understanding. No further questions at this time.  MD made aware.

## 2021-09-16 ENCOUNTER — Inpatient Hospital Stay: Payer: Medicare Other | Admitting: Hematology

## 2021-09-16 ENCOUNTER — Inpatient Hospital Stay: Payer: Medicare Other

## 2021-09-16 ENCOUNTER — Telehealth: Payer: Self-pay | Admitting: Hematology

## 2021-09-16 LAB — CBC
HCT: 32.6 % — ABNORMAL LOW (ref 39.0–52.0)
Hemoglobin: 10 g/dL — ABNORMAL LOW (ref 13.0–17.0)
MCH: 23.6 pg — ABNORMAL LOW (ref 26.0–34.0)
MCHC: 30.7 g/dL (ref 30.0–36.0)
MCV: 76.9 fL — ABNORMAL LOW (ref 80.0–100.0)
Platelets: 207 10*3/uL (ref 150–400)
RBC: 4.24 MIL/uL (ref 4.22–5.81)
RDW: 17.8 % — ABNORMAL HIGH (ref 11.5–15.5)
WBC: 6.3 10*3/uL (ref 4.0–10.5)
nRBC: 0 % (ref 0.0–0.2)

## 2021-09-16 LAB — URINALYSIS, ROUTINE W REFLEX MICROSCOPIC
Bilirubin Urine: NEGATIVE
Glucose, UA: NEGATIVE mg/dL
Hgb urine dipstick: NEGATIVE
Ketones, ur: 5 mg/dL — AB
Leukocytes,Ua: NEGATIVE
Nitrite: NEGATIVE
Protein, ur: NEGATIVE mg/dL
Specific Gravity, Urine: 1.011 (ref 1.005–1.030)
pH: 7 (ref 5.0–8.0)

## 2021-09-16 LAB — BASIC METABOLIC PANEL
Anion gap: 5 (ref 5–15)
BUN: 11 mg/dL (ref 8–23)
CO2: 25 mmol/L (ref 22–32)
Calcium: 8.3 mg/dL — ABNORMAL LOW (ref 8.9–10.3)
Chloride: 110 mmol/L (ref 98–111)
Creatinine, Ser: 0.99 mg/dL (ref 0.61–1.24)
GFR, Estimated: >60 mL/min (ref >60)
Glucose, Bld: 94 mg/dL (ref 70–99)
Potassium: 3.1 mmol/L — ABNORMAL LOW (ref 3.5–5.1)
Sodium: 140 mmol/L (ref 135–145)

## 2021-09-16 MED ORDER — MOLNUPIRAVIR 200 MG PO CAPS: 4.0000 | ORAL_CAPSULE | Freq: Two times a day (BID) | ORAL | 0 refills | Status: AC

## 2021-09-16 NOTE — Discharge Instructions (Signed)
Your lab results show no significant abnormalities.  You do have COVID-19 infection.  I am discharging you on molnupiravir which is an antiviral medication that should help diminish the severity of your symptoms and hopefully keep you from worsening or being hospitalized. You should return immediately to the emergency department if you develop severe headache that is intractable despite medications, intractable fever, severe shortness of breath, vomiting or diarrhea that is uncontrolled with medications. You may take over-the-counter cold medications for treatment of your symptoms.  Remain hydrated, isolate yourself from others until you are well.

## 2021-09-16 NOTE — Telephone Encounter (Signed)
Called pt to r/s per 6/29 inbasket, pt aware. Discussed covid protocol for 7/12 appt, pt understands and has infusion # to call

## 2021-09-21 ENCOUNTER — Other Ambulatory Visit: Payer: Self-pay | Admitting: Hematology

## 2021-09-28 ENCOUNTER — Encounter: Payer: Self-pay | Admitting: Hematology

## 2021-09-28 ENCOUNTER — Inpatient Hospital Stay: Payer: Medicare Other

## 2021-09-28 ENCOUNTER — Inpatient Hospital Stay: Payer: Medicare Other | Attending: Nurse Practitioner

## 2021-09-28 ENCOUNTER — Inpatient Hospital Stay: Payer: Medicare Other | Admitting: Hematology

## 2021-09-28 ENCOUNTER — Inpatient Hospital Stay (HOSPITAL_BASED_OUTPATIENT_CLINIC_OR_DEPARTMENT_OTHER): Payer: Medicare Other | Admitting: Hematology

## 2021-09-28 ENCOUNTER — Other Ambulatory Visit: Payer: Medicare Other

## 2021-09-28 VITALS — BP 155/64 | HR 64 | Temp 98.3°F | Resp 15

## 2021-09-28 DIAGNOSIS — Z5112 Encounter for antineoplastic immunotherapy: Secondary | ICD-10-CM | POA: Diagnosis not present

## 2021-09-28 DIAGNOSIS — E876 Hypokalemia: Secondary | ICD-10-CM | POA: Diagnosis not present

## 2021-09-28 DIAGNOSIS — Z7902 Long term (current) use of antithrombotics/antiplatelets: Secondary | ICD-10-CM | POA: Insufficient documentation

## 2021-09-28 DIAGNOSIS — Z79899 Other long term (current) drug therapy: Secondary | ICD-10-CM | POA: Diagnosis not present

## 2021-09-28 DIAGNOSIS — Z923 Personal history of irradiation: Secondary | ICD-10-CM | POA: Diagnosis not present

## 2021-09-28 DIAGNOSIS — D508 Other iron deficiency anemias: Secondary | ICD-10-CM | POA: Diagnosis present

## 2021-09-28 DIAGNOSIS — Z8546 Personal history of malignant neoplasm of prostate: Secondary | ICD-10-CM | POA: Diagnosis not present

## 2021-09-28 DIAGNOSIS — Z8673 Personal history of transient ischemic attack (TIA), and cerebral infarction without residual deficits: Secondary | ICD-10-CM | POA: Diagnosis not present

## 2021-09-28 DIAGNOSIS — C170 Malignant neoplasm of duodenum: Secondary | ICD-10-CM

## 2021-09-28 DIAGNOSIS — Z9221 Personal history of antineoplastic chemotherapy: Secondary | ICD-10-CM | POA: Diagnosis not present

## 2021-09-28 DIAGNOSIS — Z7982 Long term (current) use of aspirin: Secondary | ICD-10-CM | POA: Diagnosis not present

## 2021-09-28 DIAGNOSIS — I10 Essential (primary) hypertension: Secondary | ICD-10-CM | POA: Diagnosis not present

## 2021-09-28 LAB — CBC WITH DIFFERENTIAL (CANCER CENTER ONLY)
Abs Immature Granulocytes: 0.02 10*3/uL (ref 0.00–0.07)
Basophils Absolute: 0 10*3/uL (ref 0.0–0.1)
Basophils Relative: 0 %
Eosinophils Absolute: 0.3 10*3/uL (ref 0.0–0.5)
Eosinophils Relative: 4 %
HCT: 32.9 % — ABNORMAL LOW (ref 39.0–52.0)
Hemoglobin: 10.2 g/dL — ABNORMAL LOW (ref 13.0–17.0)
Immature Granulocytes: 0 %
Lymphocytes Relative: 19 %
Lymphs Abs: 1.3 10*3/uL (ref 0.7–4.0)
MCH: 23.2 pg — ABNORMAL LOW (ref 26.0–34.0)
MCHC: 31 g/dL (ref 30.0–36.0)
MCV: 74.8 fL — ABNORMAL LOW (ref 80.0–100.0)
Monocytes Absolute: 0.5 10*3/uL (ref 0.1–1.0)
Monocytes Relative: 7 %
Neutro Abs: 4.9 10*3/uL (ref 1.7–7.7)
Neutrophils Relative %: 70 %
Platelet Count: 252 10*3/uL (ref 150–400)
RBC: 4.4 MIL/uL (ref 4.22–5.81)
RDW: 18 % — ABNORMAL HIGH (ref 11.5–15.5)
WBC Count: 6.9 10*3/uL (ref 4.0–10.5)
nRBC: 0 % (ref 0.0–0.2)

## 2021-09-28 LAB — CMP (CANCER CENTER ONLY)
ALT: 16 U/L (ref 0–44)
AST: 17 U/L (ref 15–41)
Albumin: 3.1 g/dL — ABNORMAL LOW (ref 3.5–5.0)
Alkaline Phosphatase: 86 U/L (ref 38–126)
Anion gap: 5 (ref 5–15)
BUN: 20 mg/dL (ref 8–23)
CO2: 29 mmol/L (ref 22–32)
Calcium: 8.9 mg/dL (ref 8.9–10.3)
Chloride: 109 mmol/L (ref 98–111)
Creatinine: 1.09 mg/dL (ref 0.61–1.24)
GFR, Estimated: >60 mL/min (ref >60)
Glucose, Bld: 90 mg/dL (ref 70–99)
Potassium: 3.2 mmol/L — ABNORMAL LOW (ref 3.5–5.1)
Sodium: 143 mmol/L (ref 135–145)
Total Bilirubin: 0.9 mg/dL (ref 0.3–1.2)
Total Protein: 6.8 g/dL (ref 6.5–8.1)

## 2021-09-28 LAB — IRON AND IRON BINDING CAPACITY (CC-WL,HP ONLY)
Iron: 25 ug/dL — ABNORMAL LOW (ref 45–182)
Saturation Ratios: 13 % — ABNORMAL LOW (ref 17.9–39.5)
TIBC: 200 ug/dL — ABNORMAL LOW (ref 250–450)
UIBC: 175 ug/dL

## 2021-09-28 LAB — FERRITIN: Ferritin: 66 ng/mL (ref 24–336)

## 2021-09-28 LAB — TSH: TSH: 0.852 u[IU]/mL (ref 0.350–4.500)

## 2021-09-28 MED ORDER — SODIUM CHLORIDE 0.9 % IV SOLN
200.0000 mg | Freq: Once | INTRAVENOUS | Status: AC
  Administered 2021-09-28: 200 mg via INTRAVENOUS
  Filled 2021-09-28: qty 200

## 2021-09-28 MED ORDER — SODIUM CHLORIDE 0.9 % IV SOLN: Freq: Once | INTRAVENOUS | Status: AC

## 2021-09-28 NOTE — Progress Notes (Signed)
[facility]   [phone] Fax:([phone]) [phone]   Clinic Follow up Note   Patient Care Team: Biagio Borg, MD as PCP - General Stanford Breed Denice Bors, MD as PCP - Cardiology (Cardiology) [name], MD as Consulting Physician (Gastroenterology) [name], MD as Consulting Physician (Orthopedic Surgery) [name], MD as Consulting Physician (Ophthalmology) [name], MD as Consulting Physician (Oncology) Royston Bake, RN as Oncology Nurse Navigator (Oncology)  Date of Service:  09/28/2021  CHIEF COMPLAINT: f/u of duodenal cancer  CURRENT THERAPY:  [name], started 05/11/21  ASSESSMENT & PLAN:  Aaron Moss is a 76 y.o. male with   1. 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  Although the scope could not pass patient has no signs of obstruction. Biopsy confirmed moderate-poorly differentiated adenocarcinoma -CT CAP on 04/01/21 showed multiple enlarged lymph nodes around the pancreatic head and portacaval station, no other distant metastasis. -He was seen by Dr. Barry Dienes, offered a Whipple surgery.  Unfortunately he had ischemic stroke recently, although he recovered well, he cannot have surgery until 3 months later. -MMR testing showed loss of PMS2.  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 I recommend him to start while he is waiting for surgery.  -he began [name] on 05/11/21. He has tolerated well with mild fatigue.  -restaging CT AP on 08/02/21 showed: similar duodenal wall thickening; decreased size of upper abdominal nodes; no new or progressive disease.  -He is scheduled for Whipple surgery in 3 weeks -His last Keytruda treatment was postponed  due to COVID infection.  He has recovered well, will proceed last dose Keytruda today. -Plan to see him back 3 weeks after his surgery, discussed if he needs adjuvant chemo when he recovers well.  2. Lynch Syndrome, H/o prostate (2015) and colon (2000) cancers -he has a personal history of stage pT3N0 colon cancer in 2000 and prostate cancer dx in 2015, s/p 8 weeks of IMRT with Dr. Valere Dross. -genetic testing and counseling on 04/07/21 confirmed a PMS2 mutation, as well as VUS in ATM, BLM, and SDHA.   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 -he received three doses IV Venofer 2/22-05/25/21 and one on 06/23/21. -he is on oral iron. -hgb stable at 10.2 today (08/05/21). Given his low iron level on 07/14/21, I recommend additional IV iron infusion.   4. Recent TIA, s/p left endarterectomy carotid on 04/25/21 -On aspirin and Plavix -no residual neurodeficits   5. Hypokalemia  -he has chronic low potassium, on KCL.   6. HTN -followed by Dr. Stanford Breed      Plan  -We will proceed last scheduled dose Keytruda today -Reports surgery is scheduled for August 2 -Follow-up in 6 weeks to discuss surgical path, and decide if he needs adjuvant chemotherapy.   No problem-specific Assessment & Plan notes found for this encounter.   SUMMARY OF ONCOLOGIC HISTORY: Oncology History  Adenocarcinoma of duodenum ([place])  03/01/2021 Imaging   EXAM: ABDOMEN ULTRASOUND COMPLETE  IMPRESSION: 1. Simple appearing right renal cyst. 2. Otherwise negative abdominal ultrasound   03/18/2021 Procedure  Upper Endoscopy/Colonoscopy, Dr. Loletha Carrow  Upper Endoscopy Impression: - Normal esophagus. - A single submucosal papule (nodule) found in the stomach. - Duodenal mass. Biopsied. Concerning for malignancy.  Colonoscopy Impression: -  Diverticulosis in the entire examined colon. - The examined portion of the ileum was normal. - Patent end-to-end colo-colonic anastomosis, characterized by healthy appearing mucosa. - Internal hemorrhoids. - No specimens collected.   03/18/2021 Initial Biopsy   Diagnosis Duodenum, Biopsy - INVASIVE MODERATE TO POORLY DIFFERENTIATED ADENOCARCINOMA   03/18/2021 Cancer Staging   Staging form: Small Intestine - Adenocarcinoma, AJCC 8th Edition - Clinical stage from 03/18/2021: Stage IIIA (cTX, cN1, cM0) - Signed by [name], MD on 06/22/2021 Stage prefix: Initial diagnosis   03/22/2021 Initial Diagnosis   Adenocarcinoma of duodenum ([place])   04/01/2021 Imaging   EXAM: CT CHEST, ABDOMEN, AND PELVIS WITH CONTRAST  IMPRESSION: 1. Circumferential wall thickening of the first portion of the duodenum, in keeping with known primary malignancy. 2. Enlarged, hypodense lymph nodes anterior to the pancreatic head and in the portacaval station, concerning for nodal metastatic disease. 3. No other evidence of metastatic disease in the chest, abdomen, or pelvis. 4. Incidental note of a fluid attenuation lesion within the proximal pancreatic body measuring 1.9 x 1.4 cm, with prominence of the pancreatic duct distally, up to 0.6 cm. This lesion was present on remote prior examination dated 05/04/2009, and has slightly increased in size over a long period of time. This is consistent with a small IPMN and benign given indolent behavior over greater than 10 years. 5. Background of very fine centrilobular pulmonary nodules, most concentrated in the lung apices, most commonly seen in smoking-related respiratory bronchiolitis. 6. Coronary artery disease.   Aortic Atherosclerosis (ICD10-I70.0).    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 Of note, a variant of uncertain significance was detected in  the ATM (p.A869G), BLM (c.800-3T>G), and SDHA (p.V632F) genes. Report date is 04/27/2021.  The CancerNext-Expanded gene panel offered by [facility] and includes sequencing, rearrangement, and RNA analysis for the following 77 genes: AIP, ALK, APC, ATM, AXIN2, BAP1, BARD1, BLM, BMPR1A, BRCA1, BRCA2, BRIP1, CDC73, CDH1, CDK4, CDKN1B, CDKN2A, CHEK2, CTNNA1, DICER1, FANCC, FH, FLCN, GALNT12, KIF1B, LZTR1, MAX, MEN1, MET, MLH1, MSH2, MSH3, MSH6, MUTYH, NBN, NF1, NF2, NTHL1, PALB2, PHOX2B, PMS2, POT1, PRKAR1A, PTCH1, PTEN, RAD51C, RAD51D, RB1, RECQL, RET, SDHA, SDHAF2, SDHB, SDHC, SDHD, SMAD4, SMARCA4, SMARCB1, SMARCE1, STK11, SUFU, TMEM127, TP53, TSC1, TSC2, VHL and XRCC2 (sequencing and deletion/duplication); EGFR, EGLN1, HOXB13, KIT, MITF, PDGFRA, POLD1, and POLE (sequencing only); EPCAM and GREM1 (deletion/duplication only).    05/11/2021 -  Chemotherapy   Patient is on Treatment Plan : COLORECTAL Pembrolizumab (200) q21d        INTERVAL HISTORY:  Aaron Moss is here for a follow up of duodenal cancer. He was last seen by me on 08/05/2021. He presents to the clinic accompanied by himself.  He got a COVID infection on September 15, 2021, was treated with antivirals.  He recovered very well, no other new complaints.  He reports he is doing well overall on treatment. He denies any significant side effects from last treatment.   All other systems were reviewed with the patient and are negative.  MEDICAL HISTORY:  Past Medical History:  Diagnosis Date   Carotid stenosis, left    Colon cancer ([place]) 2000   Coronary artery disease    per Dr. Jacalyn Lefevre note from  04/13/21   Diverticulosis of colon    GERD (gastroesophageal reflux disease)    Glaucoma    History of chemotherapy 2000   colon cancer   History of radiation therapy 08/06/13- 10/03/13   prostate 7800 cGy 40 sessions, seminal vesicles 5600 cGy 40 sessions   Hyperlipidemia    Hypertension    Left knee DJD    Prostate cancer ([facility] [place])  05/23/2013   gleason 3+4=7, volume 11.5 ml   TIA (transient ischemic attack)     SURGICAL HISTORY: Past Surgical History:  Procedure Laterality Date   COLON SURGERY  2000   colon cancer   COLONOSCOPY     ENDARTERECTOMY Left 04/25/2021   Procedure: LEFT ENDARTERECTOMY CAROTID;  Surgeon: Marty Heck, MD;  Location: [facility];  Service: Vascular;  Laterality: Left;   EUS     pancreatic cyst   KNEE ARTHROSCOPY  2007   LT- [facility]   PATCH ANGIOPLASTY Left 04/25/2021   Procedure: PATCH ANGIOPLASTY XENOSURE 1CM X 6CM;  Surgeon: Marty Heck, MD;  Location: [facility];  Service: Vascular;  Laterality: Left;   PROSTATE BIOPSY  05/23/13   gleason 7, volume 11.5 ml    I have reviewed the social history and family history with the patient and they are unchanged from previous note.  ALLERGIES:  is allergic to lyrica [pregabalin].  MEDICATIONS:  Current Outpatient Medications  Medication Sig Dispense Refill   amLODipine (NORVASC) 10 MG tablet Take 1 tablet by mouth once daily 90 tablet 3   aspirin EC 81 MG EC tablet Take 1 tablet (81 mg total) by mouth daily. Swallow whole. 30 tablet 11   atorvastatin (LIPITOR) 40 MG tablet Take 1 tablet by mouth once daily 90 tablet 0   benazepril (LOTENSIN) 40 MG tablet Take 1 tablet by mouth once daily 90 tablet 3   carvedilol (COREG) 12.5 MG tablet Take 12.5 mg by mouth 2 (two) times daily. (Patient not taking: Reported on 07/26/2021)     clopidogrel (PLAVIX) 75 MG tablet Take 1 tablet (75 mg total) by mouth daily. 21 tablet 0   iron polysaccharides (NU-IRON) 150 MG capsule Take 1 capsule (150 mg total) by mouth 2 (two) times daily. 180 capsule 1   pantoprazole (PROTONIX) 40 MG tablet Take 1 tablet (40 mg total) by mouth daily. 90 tablet 3   potassium chloride (KLOR-CON M) 10 MEQ tablet Take 1 tablet (10 mEq total) by mouth daily. 30 tablet 1   traMADol (ULTRAM) 50 MG tablet TAKE 1 TABLET BY MOUTH EVERY 6 HOURS AS NEEDED FOR PAIN 120 tablet 1    Travoprost, BAK Free, (TRAVATAN) 0.004 % SOLN ophthalmic solution Place 1 drop into both eyes at bedtime.     VITAMIN D PO Take 1 tablet by mouth daily.     No current facility-administered medications for this visit.    PHYSICAL EXAMINATION: ECOG PERFORMANCE STATUS: 1 - Symptomatic but completely ambulatory  There were no vitals filed for this visit.  Wt Readings from Last 3 Encounters:  09/15/21 213 lb (96.6 kg)  08/26/21 215 lb 8 oz (97.8 kg)  08/05/21 216 lb 14.4 oz (98.4 kg)     GENERAL:alert, no distress and comfortable SKIN: skin color normal, no rashes or significant lesions EYES: normal, Conjunctiva are pink and non-injected, sclera clear  NEURO: alert & oriented x 3 with fluent speech  LABORATORY DATA:  I have reviewed the data as listed    Latest Ref Rng & Units 09/28/2021  2:00 PM 09/15/2021   12:15 AM 08/26/2021   11:46 AM  CBC  WBC 4.0 - 10.5 K/uL 6.9  6.3  6.9   Hemoglobin 13.0 - 17.0 g/dL 10.2  10.0  10.3   Hematocrit 39.0 - 52.0 % 32.9  32.6  32.9   Platelets 150 - 400 K/uL 252  207  252         Latest Ref Rng & Units 09/28/2021    2:00 PM 09/15/2021   12:15 AM 08/26/2021   11:46 AM  CMP  Glucose 70 - 99 mg/dL 90  94  122   BUN 8 - 23 mg/dL $Remove'20  11  14   'PbngNKs$ Creatinine 0.61 - 1.24 mg/dL 1.09  0.99  0.99   Sodium 135 - 145 mmol/L 143  140  146   Potassium 3.5 - 5.1 mmol/L 3.2  3.1  3.3   Chloride 98 - 111 mmol/L 109  110  111   CO2 22 - 32 mmol/L $RemoveB'29  25  30   'UQmhJeTo$ Calcium 8.9 - 10.3 mg/dL 8.9  8.3  9.2   Total Protein 6.5 - 8.1 g/dL 6.8   6.7   Total Bilirubin 0.3 - 1.2 mg/dL 0.9   0.6   Alkaline Phos 38 - 126 U/L 86   95   AST 15 - 41 U/L 17   13   ALT 0 - 44 U/L 16   11       RADIOGRAPHIC STUDIES: I have personally reviewed the radiological images as listed and agreed with the findings in the report. No results found.    No orders of the defined types were placed in this encounter.  All questions were answered. The patient knows to call the clinic  with any problems, questions or concerns. No barriers to learning was detected. The total time spent in the appointment was 30 minutes.     [name], MD 09/28/2021

## 2021-09-29 ENCOUNTER — Telehealth: Payer: Self-pay | Admitting: Hematology

## 2021-09-29 LAB — T4: T4, Total: 7.9 ug/dL (ref 4.5–12.0)

## 2021-09-29 NOTE — Telephone Encounter (Signed)
Scheduled follow-up appointment per 7/12 los. Patient is aware.

## 2021-10-10 ENCOUNTER — Other Ambulatory Visit: Payer: Self-pay

## 2021-10-10 ENCOUNTER — Other Ambulatory Visit: Payer: Self-pay | Admitting: Internal Medicine

## 2021-10-10 NOTE — Telephone Encounter (Signed)
Please refill as per office routine med refill policy (all routine meds to be refilled for 3 mo or monthly (per pt preference) up to one year from last visit, then month to month grace period for 3 mo, then further med refills will have to be denied) ? ?

## 2021-10-11 NOTE — Progress Notes (Signed)
Surgical Instructions    Your procedure is scheduled on Wednesday August 2nd .  Report to [facility] Main Entrance "A" at 6:30 A.M., then check in with the Admitting office.  Call this number if you have problems the morning of surgery:  [phone]   If you have any questions prior to your surgery date call [phone]: Open Monday-Friday 8am-4pm    Remember:  Do not eat after midnight the night before your surgery  You may drink clear liquids until 5:30am the morning of your surgery.   Clear liquids allowed are: Water, Non-Citrus Juices (without pulp), Carbonated Beverages, Clear Tea, Black Coffee ONLY (NO MILK, CREAM OR POWDERED CREAMER of any kind), and Gatorade  Enhanced Recovery after Surgery for Orthopedics Enhanced Recovery after Surgery is a protocol used to improve the stress on your body and your recovery after surgery.  Patient Instructions  The day of surgery (if you do NOT have diabetes):  Drink TWO (2) pre-surgery clear ensure by 5:45 pm the evening before surgery. Drink ONE (1) Pre-Surgery Clear Ensure by ___5:30__ am the morning of surgery   This drink was given to you during your hospital  pre-op appointment visit. Nothing else to drink after completing the  Pre-Surgery Clear Ensure.         If you have questions, please contact your surgeon's office.    Take these medicines the morning of surgery with A SIP OF WATER: amLODipine (NORVASC) 10 MG tablet atorvastatin (LIPITOR) 40 MG tablet carvedilol (COREG) 12.5 MG tablet pantoprazole (PROTONIX) 40 MG tablet potassium chloride (KLOR-CON M) 10 MEQ tablet  IF NEEDED  traMADol (ULTRAM) 50 MG tablet    Follow your surgeon's instructions on when to stop Aspirin and Plavix.  If no instructions were given by your surgeon then you will need to call the office to get those instructions.    As of today, STOP taking any Aspirin (unless otherwise instructed by your surgeon) Aleve, Naproxen, Ibuprofen, Motrin,  Advil, Goody's, BC's, all herbal medications, fish oil, and all vitamins.           Do not wear jewelry  Do not wear lotions, powders, colognes, or deodorant. Do not shave 48 hours prior to surgery.  Men may shave face and neck. Do not bring valuables to the hospital. Do not wear nail polish, gel polish, artificial nails, or any other type of covering on natural nails (fingers and toes) If you have artificial nails or gel coating that need to be removed by a nail salon, please have this removed prior to surgery. Artificial nails or gel coating may interfere with anesthesia's ability to adequately monitor your vital signs.  [place] is not responsible for any belongings or valuables. .   Do NOT Smoke (Tobacco/Vaping)  24 hours prior to your procedure  If you use a CPAP at night, you may bring your mask for your overnight stay.   Contacts, glasses, hearing aids, dentures or partials may not be worn into surgery, please bring cases for these belongings   For patients admitted to the hospital, discharge time will be determined by your treatment team.   Patients discharged the day of surgery will not be allowed to drive home, and someone needs to stay with them for 24 hours.   SURGICAL WAITING ROOM VISITATION Patients having surgery or a procedure may have no more than 2 support people in the waiting area - these visitors may rotate.   Children under the age of 53 must have an  adult with them who is not the patient. If the patient needs to stay at the hospital during part of their recovery, the visitor guidelines for inpatient rooms apply. Pre-op nurse will coordinate an appropriate time for 1 support person to accompany patient in pre-op.  This support person may not rotate.   Please refer to the [facility] website for the visitor guidelines for Inpatients (after your surgery is over and you are in a regular room).    Special instructions:    Oral Hygiene is also important to reduce  your risk of infection.  Remember - BRUSH YOUR TEETH THE MORNING OF SURGERY WITH YOUR REGULAR TOOTHPASTE   [place]- Preparing For Surgery  Before surgery, you can play an important role. Because skin is not sterile, your skin needs to be as free of germs as possible. You can reduce the number of germs on your skin by washing with CHG (chlorahexidine gluconate) Soap before surgery.  CHG is an antiseptic cleaner which kills germs and bonds with the skin to continue killing germs even after washing.     Please do not use if you have an allergy to CHG or antibacterial soaps. If your skin becomes reddened/irritated stop using the CHG.  Do not shave (including legs and underarms) for at least 48 hours prior to first CHG shower. It is OK to shave your face.  Please follow these instructions carefully.     Shower the NIGHT BEFORE SURGERY and the MORNING OF SURGERY with CHG Soap.   If you chose to wash your hair, wash your hair first as usual with your normal shampoo. After you shampoo, rinse your hair and body thoroughly to remove the shampoo.  Then [employer] and genitals (private parts) with your normal soap and rinse thoroughly to remove soap.  After that Use CHG Soap as you would any other liquid soap. You can apply CHG directly to the skin and wash gently with a scrungie or a clean washcloth.   Apply the CHG Soap to your body ONLY FROM THE NECK DOWN.  Do not use on open wounds or open sores. Avoid contact with your eyes, ears, mouth and genitals (private parts). Wash Face and genitals (private parts)  with your normal soap.   Wash thoroughly, paying special attention to the area where your surgery will be performed.  Thoroughly rinse your body with warm water from the neck down.  DO NOT shower/wash with your normal soap after using and rinsing off the CHG Soap.  Pat yourself dry with a CLEAN TOWEL.  Wear CLEAN PAJAMAS to bed the night before surgery  Place CLEAN SHEETS on your bed the  night before your surgery  DO NOT SLEEP WITH PETS.   Day of Surgery:  Take a shower with CHG soap. Wear Clean/Comfortable clothing the morning of surgery Do not apply any deodorants/lotions.   Remember to brush your teeth WITH YOUR REGULAR TOOTHPASTE.    If you received a COVID test during your pre-op visit, it is requested that you wear a mask when out in public, stay away from anyone that may not be feeling well, and notify your surgeon if you develop symptoms. If you have been in contact with anyone that has tested positive in the last 10 days, please notify your surgeon.    Please read over the following fact sheets that you were given.

## 2021-10-12 ENCOUNTER — Encounter (HOSPITAL_COMMUNITY)
Admission: RE | Admit: 2021-10-12 | Discharge: 2021-10-12 | Disposition: A | Discharge status: Home / Self Care | Payer: Medicare Other | Source: Ambulatory Visit | Attending: General Surgery | Admitting: General Surgery

## 2021-10-12 ENCOUNTER — Encounter (HOSPITAL_COMMUNITY): Payer: Self-pay

## 2021-10-12 ENCOUNTER — Other Ambulatory Visit: Payer: Self-pay

## 2021-10-12 DIAGNOSIS — E785 Hyperlipidemia, unspecified: Secondary | ICD-10-CM | POA: Insufficient documentation

## 2021-10-12 DIAGNOSIS — Z01818 Encounter for other preprocedural examination: Secondary | ICD-10-CM | POA: Diagnosis present

## 2021-10-12 DIAGNOSIS — C170 Malignant neoplasm of duodenum: Secondary | ICD-10-CM

## 2021-10-12 DIAGNOSIS — I6529 Occlusion and stenosis of unspecified carotid artery: Secondary | ICD-10-CM | POA: Diagnosis not present

## 2021-10-12 DIAGNOSIS — R739 Hyperglycemia, unspecified: Secondary | ICD-10-CM | POA: Insufficient documentation

## 2021-10-12 DIAGNOSIS — Z8673 Personal history of transient ischemic attack (TIA), and cerebral infarction without residual deficits: Secondary | ICD-10-CM | POA: Insufficient documentation

## 2021-10-12 DIAGNOSIS — Z01812 Encounter for preprocedural laboratory examination: Secondary | ICD-10-CM | POA: Insufficient documentation

## 2021-10-12 DIAGNOSIS — I10 Essential (primary) hypertension: Secondary | ICD-10-CM | POA: Insufficient documentation

## 2021-10-12 DIAGNOSIS — D649 Anemia, unspecified: Secondary | ICD-10-CM | POA: Insufficient documentation

## 2021-10-12 LAB — COMPREHENSIVE METABOLIC PANEL
ALT: 16 U/L (ref 0–44)
AST: 16 U/L (ref 15–41)
Albumin: 2.9 g/dL — ABNORMAL LOW (ref 3.5–5.0)
Alkaline Phosphatase: 91 U/L (ref 38–126)
Anion gap: 5 (ref 5–15)
BUN: 14 mg/dL (ref 8–23)
CO2: 28 mmol/L (ref 22–32)
Calcium: 8.6 mg/dL — ABNORMAL LOW (ref 8.9–10.3)
Chloride: 110 mmol/L (ref 98–111)
Creatinine, Ser: 1.11 mg/dL (ref 0.61–1.24)
GFR, Estimated: >60 mL/min (ref >60)
Glucose, Bld: 113 mg/dL — ABNORMAL HIGH (ref 70–99)
Potassium: 3.2 mmol/L — ABNORMAL LOW (ref 3.5–5.1)
Sodium: 143 mmol/L (ref 135–145)
Total Bilirubin: 0.8 mg/dL (ref 0.3–1.2)
Total Protein: 6.1 g/dL — ABNORMAL LOW (ref 6.5–8.1)

## 2021-10-12 LAB — CBC WITH DIFFERENTIAL/PLATELET
Abs Immature Granulocytes: 0.01 10*3/uL (ref 0.00–0.07)
Basophils Absolute: 0 10*3/uL (ref 0.0–0.1)
Basophils Relative: 0 %
Eosinophils Absolute: 0.4 10*3/uL (ref 0.0–0.5)
Eosinophils Relative: 5 %
HCT: 32.4 % — ABNORMAL LOW (ref 39.0–52.0)
Hemoglobin: 9.8 g/dL — ABNORMAL LOW (ref 13.0–17.0)
Immature Granulocytes: 0 %
Lymphocytes Relative: 17 %
Lymphs Abs: 1.2 10*3/uL (ref 0.7–4.0)
MCH: 23.3 pg — ABNORMAL LOW (ref 26.0–34.0)
MCHC: 30.2 g/dL (ref 30.0–36.0)
MCV: 77 fL — ABNORMAL LOW (ref 80.0–100.0)
Monocytes Absolute: 0.5 10*3/uL (ref 0.1–1.0)
Monocytes Relative: 7 %
Neutro Abs: 4.7 10*3/uL (ref 1.7–7.7)
Neutrophils Relative %: 71 %
Platelets: 246 10*3/uL (ref 150–400)
RBC: 4.21 MIL/uL — ABNORMAL LOW (ref 4.22–5.81)
RDW: 18.6 % — ABNORMAL HIGH (ref 11.5–15.5)
WBC: 6.7 10*3/uL (ref 4.0–10.5)
nRBC: 0 % (ref 0.0–0.2)

## 2021-10-12 LAB — PROTIME-INR
INR: 1.1 (ref 0.8–1.2)
Prothrombin Time: 14.1 seconds (ref 11.4–15.2)

## 2021-10-12 LAB — PREPARE RBC (CROSSMATCH)

## 2021-10-12 LAB — HEMOGLOBIN A1C
Hgb A1c MFr Bld: 5.6 % (ref 4.8–5.6)
Mean Plasma Glucose: 114.02 mg/dL

## 2021-10-12 NOTE — Progress Notes (Signed)
PCP - Cathlean Cower MD Cardiologist - Lenna Sciara, NP, Primary [facility] Clearance in [facility]  08/24/21  PPM/ICD - Denies  Chest x-ray - NI EKG - 04/18/21 Stress Test - Denies ECHO - 04/19/21 Cardiac Cath - Denies  Sleep Study - Denies  DM - Denies  Blood Thinner Instructions: Plavix not since January  Aspirin Instructions: Aspirin request patient to call [facility] for instruction on when to stop aspirin.   ERAS Protcol -Yes  PRE-SURGERY Ensure three given   COVID TEST- NI   Anesthesia review: Yes cardiac clearance in EPIC  Patient denies shortness of breath, fever, cough and chest pain at PAT appointment   All instructions explained to the patient, with a verbal understanding of the material. Patient agrees to go over the instructions while at home for a better understanding. The opportunity to ask questions was provided.

## 2021-10-13 NOTE — Anesthesia Preprocedure Evaluation (Addendum)
Anesthesia Evaluation  Patient identified by MRN, date of birth, ID band Patient awake    Reviewed: Allergy & Precautions, NPO status , Patient's Chart, lab work & pertinent test results, reviewed documented beta blocker date and time   Airway Mallampati: III  TM Distance: >3 FB Neck ROM: Full    Dental  (+) Loose,    Pulmonary former smoker,  Quit smoking 1990, 22 pack year history    Pulmonary exam normal breath sounds clear to auscultation       Cardiovascular hypertension (157/64 in preop, per pt normally 140 SBP), Pt. on medications and Pt. on home beta blockers Normal cardiovascular exam Rhythm:Regular Rate:Normal  Echo 1. Left ventricular ejection fraction, by estimation, is 65 to 70%. The  left ventricle has normal function. The left ventricle has no regional  wall motion abnormalities. Left ventricular diastolic parameters were  normal.  2. Right ventricular systolic function is normal. The right ventricular  size is normal. There is normal pulmonary artery systolic pressure.  3. The mitral valve is normal in structure. No evidence of mitral valve  regurgitation. No evidence of mitral stenosis.  4. The aortic valve is normal in structure. Aortic valve regurgitation is  not visualized. No aortic stenosis is present.  5. The inferior vena cava is normal in size with greater than 50%  respiratory variability, suggesting right atrial pressure of 3 mmHg.    Neuro/Psych CVA (03/2021), No Residual Symptoms negative psych ROS   GI/Hepatic Neg liver ROS, GERD  Controlled and Medicated,Duodenal ca   Endo/Other  negative endocrine ROSBmi 34  Renal/GU Renal InsufficiencyRenal diseaseCr 1.11  negative genitourinary   Musculoskeletal  (+) Arthritis , Osteoarthritis,    Abdominal   Peds  Hematology  (+) Blood dyscrasia, anemia , Hb 9.8, plt 246   Anesthesia Other Findings   Reproductive/Obstetrics negative OB  ROS                           Anesthesia Physical Anesthesia Plan  ASA: 3  Anesthesia Plan: General and Regional   Post-op Pain Management: Regional block* and Ofirmev IV (intra-op)*   Induction: Intravenous  PONV Risk Score and Plan: 2 and Ondansetron, Dexamethasone, Midazolam and Treatment may vary due to age or medical condition  Airway Management Planned: Oral ETT  Additional Equipment: None  Intra-op Plan:   Post-operative Plan: Extubation in OR  Informed Consent: I have reviewed the patients History and Physical, chart, labs and discussed the procedure including the risks, benefits and alternatives for the proposed anesthesia with the patient or authorized representative who has indicated his/her understanding and acceptance.     Dental advisory given  Plan Discussed with: CRNA  Anesthesia Plan Comments: ( GA/TAP block Post-induction arterial line and 2nd PIV)      Anesthesia Quick Evaluation

## 2021-10-13 NOTE — Progress Notes (Signed)
Anesthesia Chart Review:  Follows with cardiology for history of coronary calcifications on CT, HTN, HLD, carotid artery stenosis s/p left CEA 04/2021, TIA.  Seen by Diona Browner, NP 08/24/2021 via televisit for preop evaluation.  Per note, "According to the Revised Cardiac Risk Index (RCRI), his Perioperative Risk of Major Cardiac Event is (%): 0.9. His Functional Capacity in METs is: 7.01 according to the [facility] Activity Status Index (DASI).Therefore, based on ACC/AHA guidelines, patient would be at acceptable risk for the planned procedure without further cardiovascular testing. Patient was advised that should he develop any new symptoms between now and the time of surgery, to please notify our office. Patient's aspirin and Plavix are not prescribed by cardiology. Recommendations for holding medications will need to come from prescribing provider. Of note, patient states he is no longer taking Plavix."  Preop labs reviewed, potassium mildly low at 3.2, moderate anemia with hemoglobin 9.8 (appears to be near baseline), otherwise unremarkable.  EKG 04/18/2021: Sinus rhythm.  Rate 73.  Borderline LAD.  Borderline prolonged QT.  TTE 04/19/2021:  1. Left ventricular ejection fraction, by estimation, is 65 to 70%. The  left ventricle has normal function. The left ventricle has no regional  wall motion abnormalities. Left ventricular diastolic parameters were  normal.   2. Right ventricular systolic function is normal. The right ventricular  size is normal. There is normal pulmonary artery systolic pressure.   3. The mitral valve is normal in structure. No evidence of mitral valve  regurgitation. No evidence of mitral stenosis.   4. The aortic valve is normal in structure. Aortic valve regurgitation is  not visualized. No aortic stenosis is present.   5. The inferior vena cava is normal in size with greater than 50%  respiratory variability, suggesting right atrial pressure of 3 mmHg.    Wynonia Musty [facility] Short Stay Center/Anesthesiology Phone [phone] 10/13/2021 2:31 PM

## 2021-10-14 ENCOUNTER — Other Ambulatory Visit: Payer: Self-pay | Admitting: Hematology

## 2021-10-17 NOTE — H&P (Signed)
PROVIDER:  Georgianne Fick, MD   MRN: [id number] DOB: [birth year]  Subjective     Chief Complaint: Discuss Pancreatic Surgery       History of Present Illness: Aaron Moss is a 76 y.o. male who is seen today for follow up of duodenal cancer.    Initial history: Patient presented for medical care for weight loss and iron deficiency anemia around christmas/new years 2022.  He has a personal history of colon cancer over 20 years ago and had been following up with colonoscopies.  He underwent colonoscopy and upper endoscopy by Dr. Loletha Carrow on December 30.  His colonoscopy was negative other than diverticulosis in the entire colon and internal hemorrhoids.  Unfortunately, his upper endoscopy showed a small nodule in the incisura of the stomach and a circumferential ulcerated mass was found in the duodenal bulb.  This was partially obstructed.  The scope was not able to pass beyond the mass.  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  Staging scans showed the cancer as well as localized enlarged lymph nodes.  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  This had been present and stable since 2011.  There also were very fine pulmonary nodules concentrated and a pattern consistent with bronchiolitis in the past.   Of note, his colon cancer was taken care of by Dr. Margot Chimes.  Dr Valere Dross tx prostate cancer in 2015 with radiation.       Interval history: The patient was set up for surgery, however he had a stroke.  This was found when he had a fall at home.  He underwent carotid endarterectomy in February and took Plavix for 3 months.  Because of the risk of hypotension and perhaps reactivation of stroke symptoms, we delayed surgery and he underwent neoadjuvant Keytruda.   He has done well with this.  He feels mostly back to normal.  He is able to mow the yard and wash cars.  He has a little bit of numbness on his left jaw.  He is no longer having issues with walking.   He has had  restaging studies which do not show any significant difference in the appearance of the duodenal thickening and lymphadenopathy.  Several nodes were documented as being slightly smaller.   The patient presents to discuss rescheduling surgery.   Ct abd/pelvis 08/03/2021 IMPRESSION: 1. Similar wall thickening of the first part of the duodenum with decreased size of the upper abdominal lymph nodes. No evidence of new or progressive disease in the abdomen or pelvis. 2. Fluid attenuation lesion in the pancreatic body measures 17 x 15 mm, present dating back to at least 05/04/2009 and slowly increasing in size over that time with similar dilation of the upstream pancreatic duct measuring 4 mm without significant pancreatic atrophy. 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 However, in the setting of some upstream pancreatic ductal dilation a main branch IPMN is a pertinent differential consideration, consider further evaluation with fluid sampling versus MRCP with and without contrast. 3. Extensive colonic diverticulosis without findings of acute diverticulitis. 4.  Aortic Atherosclerosis (ICD10-I70.0).   Review of Systems: A complete review of systems was obtained from the patient.  I have reviewed this information and discussed as appropriate with the patient.  See HPI as well for other ROS.   Review of Systems  All other systems reviewed and are negative.  Medical History: Past Medical History      Past Medical History:  Diagnosis Date   Anemia     GERD (gastroesophageal reflux disease)     History of cancer     Hyperlipidemia     Hypertension             Patient Active Problem List  Diagnosis   Adenocarcinoma of duodenum (CMS-HCC)   Malignant neoplasm of prostate (CMS-HCC)   History of colon cancer   Hypertension, uncontrolled    Iron deficiency anemia      Past Surgical History       Past Surgical History:  Procedure Laterality Date   Colon Surgery   2000        Allergies       Allergies  Allergen Reactions   Pregabalin Unknown      REACTION: felt bad              Current Outpatient Medications on File Prior to Visit  Medication Sig Dispense Refill   amLODIPine (NORVASC) 10 MG tablet Take 1 tablet by mouth once daily       atorvastatin (LIPITOR) 40 MG tablet Take 40 mg by mouth once daily       benazepriL (LOTENSIN) 40 MG tablet Take 40 mg by mouth once daily       hydroCHLOROthiazide (HYDRODIURIL) 25 MG tablet Take 1 tablet by mouth once daily       iron polysaccharides (FERREX) 150 mg iron capsule Take 1 capsule by mouth 2 (two) times daily       metoprolol succinate (TOPROL-XL) 100 MG XL tablet Take by mouth       pantoprazole (PROTONIX) 40 MG DR tablet Take 40 mg by mouth once daily       potassium chloride (KLOR-CON) 10 MEQ ER tablet Take by mouth       traMADoL (ULTRAM) 50 mg tablet Take 1 tablet by mouth every 6 (six) hours as needed        No current facility-administered medications on file prior to visit.      Family History       Family History  Problem Relation Age of Onset   Colon cancer Sister     Diabetes Brother          Social History        Tobacco Use  Smoking Status Former   Types: Cigarettes   Quit date: 1995   Years since quitting: 28.4  Smokeless Tobacco Never      Social History  Social History         Socioeconomic History   Marital status: Married  Tobacco Use   Smoking status: Former      Types: Cigarettes      Quit date: 1995      Years since quitting: 28.4   Smokeless tobacco: Never  Substance and Sexual Activity   Alcohol use: Never   Drug use: Never        Objective:         Vitals:      Pulse: 74  Temp: 36.9 C (98.4 F)  SpO2: 98%  Weight: 98.8 kg (217 lb 12.8 oz)  Height: 167.6 cm ('5\' 6"'$ )    Body mass index is 35.15  kg/m.   Head:   Normocephalic and atraumatic.  Eyes:    Conjunctivae are normal. Pupils are equal, round, and reactive to light. No scleral icterus.  Neck:   Normal range of motion. Neck supple.  No tracheal deviation present. No thyromegaly present.  Resp:   No respiratory distress, normal effort. Abd:      Abdomen is soft, non distended and non tender. No masses are palpable.  There is no rebound and no guarding. Prior upper abdominal incision from his colectomy. Neurological: Alert and oriented to person, place, and time. Coordination normal.  Skin:    Skin is warm and dry. No rash noted. No diaphoretic. No erythema. No pallor.  Psychiatric: Normal mood and affect. Normal behavior. Judgment and thought content normal.      Labs, Imaging and Diagnostic Testing:   Labs 08/05/21 CBC with HCT 32.8 CMET with ablvumin 3.3   Assessment and Plan:     Diagnoses and all orders for this visit:   Adenocarcinoma of duodenum (CMS-HCC)   History of colon cancer     After extensive discussion with the patient and his daughter, we will plan to proceed with surgery.  I discussed the circumstances that could lead to aborting the operation. I discussed that it would be least around 6 months before he follow-up close to normal.   I discussed the surgery with the patient including diagrams of anatomy.  I discussed the potential for diagnostic laparoscopy.  In the case of pancreatic cancer, if spread of the disease is found, we will abort the procedure and not proceed with resection.  The rationale for this was discussed with the patient.  There has not been data to support resection of Stage IV disease in terms of survival benefit.     We discussed possible complications including: Potential of aborting procedure if tumor is invading the superior mesenteric or hepatic arteries Bleeding Infection and possible wound complications such as hernia Damage to adjacent structures Leak of anastamoses,  primarily pancreatic Possible need for other procedures/surgeries Possible prolonged nausea with possible need for external feeding.   Possible prolonged hospital stay. Possible development of diabetes or worsening of current diabetes.  Possible pancreatic exocrine insufficiency Prolonged fatigue/weakness/appetite Possible early recurrence of cancer     The patient understands and wishes to proceed.  The patient has been advised to turn in disability paperwork to our office.         No follow-ups on file. Georgianne Fick, MD

## 2021-10-19 ENCOUNTER — Other Ambulatory Visit: Payer: Self-pay

## 2021-10-19 ENCOUNTER — Inpatient Hospital Stay (HOSPITAL_COMMUNITY): Payer: Medicare Other

## 2021-10-19 ENCOUNTER — Inpatient Hospital Stay (HOSPITAL_COMMUNITY)
Admission: RE | Admit: 2021-10-19 | Discharge: 2021-10-24 | DRG: 328 | Disposition: A | Discharge status: Home Health | Payer: Medicare Other | Attending: General Surgery | Admitting: General Surgery

## 2021-10-19 ENCOUNTER — Inpatient Hospital Stay (HOSPITAL_COMMUNITY): Payer: Medicare Other | Admitting: Vascular Surgery

## 2021-10-19 ENCOUNTER — Encounter (HOSPITAL_COMMUNITY)
Admission: RE | Disposition: A | Discharge status: Home Health | Payer: Self-pay | Source: Home / Self Care | Attending: General Surgery

## 2021-10-19 ENCOUNTER — Encounter (HOSPITAL_COMMUNITY): Payer: Self-pay | Admitting: General Surgery

## 2021-10-19 DIAGNOSIS — Z800 Family history of malignant neoplasm of digestive organs: Secondary | ICD-10-CM | POA: Diagnosis not present

## 2021-10-19 DIAGNOSIS — E559 Vitamin D deficiency, unspecified: Secondary | ICD-10-CM | POA: Diagnosis present

## 2021-10-19 DIAGNOSIS — D500 Iron deficiency anemia secondary to blood loss (chronic): Secondary | ICD-10-CM | POA: Diagnosis present

## 2021-10-19 DIAGNOSIS — Z8673 Personal history of transient ischemic attack (TIA), and cerebral infarction without residual deficits: Secondary | ICD-10-CM | POA: Diagnosis not present

## 2021-10-19 DIAGNOSIS — Z923 Personal history of irradiation: Secondary | ICD-10-CM

## 2021-10-19 DIAGNOSIS — Z85038 Personal history of other malignant neoplasm of large intestine: Secondary | ICD-10-CM

## 2021-10-19 DIAGNOSIS — K219 Gastro-esophageal reflux disease without esophagitis: Secondary | ICD-10-CM | POA: Diagnosis present

## 2021-10-19 DIAGNOSIS — Z833 Family history of diabetes mellitus: Secondary | ICD-10-CM

## 2021-10-19 DIAGNOSIS — D6481 Anemia due to antineoplastic chemotherapy: Secondary | ICD-10-CM | POA: Diagnosis present

## 2021-10-19 DIAGNOSIS — I700 Atherosclerosis of aorta: Secondary | ICD-10-CM | POA: Diagnosis present

## 2021-10-19 DIAGNOSIS — C170 Malignant neoplasm of duodenum: Principal | ICD-10-CM | POA: Diagnosis present

## 2021-10-19 DIAGNOSIS — Z8546 Personal history of malignant neoplasm of prostate: Secondary | ICD-10-CM

## 2021-10-19 DIAGNOSIS — Z538 Procedure and treatment not carried out for other reasons: Secondary | ICD-10-CM | POA: Diagnosis not present

## 2021-10-19 DIAGNOSIS — I10 Essential (primary) hypertension: Secondary | ICD-10-CM | POA: Diagnosis present

## 2021-10-19 DIAGNOSIS — I2510 Atherosclerotic heart disease of native coronary artery without angina pectoris: Secondary | ICD-10-CM | POA: Diagnosis present

## 2021-10-19 DIAGNOSIS — Z87891 Personal history of nicotine dependence: Secondary | ICD-10-CM

## 2021-10-19 DIAGNOSIS — E785 Hyperlipidemia, unspecified: Secondary | ICD-10-CM | POA: Diagnosis present

## 2021-10-19 DIAGNOSIS — T451X5A Adverse effect of antineoplastic and immunosuppressive drugs, initial encounter: Secondary | ICD-10-CM | POA: Diagnosis present

## 2021-10-19 DIAGNOSIS — E876 Hypokalemia: Secondary | ICD-10-CM | POA: Diagnosis present

## 2021-10-19 DIAGNOSIS — D630 Anemia in neoplastic disease: Secondary | ICD-10-CM | POA: Diagnosis not present

## 2021-10-19 HISTORY — PX: WHIPPLE PROCEDURE: SHX2667

## 2021-10-19 HISTORY — PX: CHOLECYSTECTOMY: SHX55

## 2021-10-19 HISTORY — PX: LAPAROSCOPY: SHX197

## 2021-10-19 LAB — CBC
HCT: 36.4 % — ABNORMAL LOW (ref 39.0–52.0)
Hemoglobin: 11.2 g/dL — ABNORMAL LOW (ref 13.0–17.0)
MCH: 23.2 pg — ABNORMAL LOW (ref 26.0–34.0)
MCHC: 30.8 g/dL (ref 30.0–36.0)
MCV: 75.4 fL — ABNORMAL LOW (ref 80.0–100.0)
Platelets: 262 10*3/uL (ref 150–400)
RBC: 4.83 MIL/uL (ref 4.22–5.81)
RDW: 19 % — ABNORMAL HIGH (ref 11.5–15.5)
WBC: 16.3 10*3/uL — ABNORMAL HIGH (ref 4.0–10.5)
nRBC: 0 % (ref 0.0–0.2)

## 2021-10-19 LAB — CREATININE, SERUM
Creatinine, Ser: 0.92 mg/dL (ref 0.61–1.24)
GFR, Estimated: >60 mL/min (ref >60)

## 2021-10-19 SURGERY — LAPAROSCOPY, DIAGNOSTIC
Anesthesia: Regional | Site: Abdomen

## 2021-10-19 MED ORDER — ONDANSETRON HCL 4 MG/2ML IJ SOLN
INTRAMUSCULAR | Status: DC | PRN
  Administered 2021-10-19: 4 mg via INTRAVENOUS

## 2021-10-19 MED ORDER — PROPOFOL 10 MG/ML IV BOLUS
INTRAVENOUS | Status: AC
  Filled 2021-10-19: qty 20

## 2021-10-19 MED ORDER — ONDANSETRON HCL 4 MG/2ML IJ SOLN: 4.0000 mg | Freq: Once | INTRAMUSCULAR | Status: DC | PRN

## 2021-10-19 MED ORDER — LIDOCAINE 2% (20 MG/ML) 5 ML SYRINGE
INTRAMUSCULAR | Status: AC
  Filled 2021-10-19: qty 5

## 2021-10-19 MED ORDER — ROPIVACAINE HCL 5 MG/ML IJ SOLN
INTRAMUSCULAR | Status: DC | PRN
  Administered 2021-10-19: 30 mL via PERINEURAL

## 2021-10-19 MED ORDER — ONDANSETRON HCL 4 MG/2ML IJ SOLN
INTRAMUSCULAR | Status: AC
  Filled 2021-10-19: qty 2

## 2021-10-19 MED ORDER — HYDROMORPHONE HCL 1 MG/ML IJ SOLN
0.2500 mg | INTRAMUSCULAR | Status: DC | PRN
  Administered 2021-10-19: 0.25 mg via INTRAVENOUS
  Administered 2021-10-19 (×2): 0.5 mg via INTRAVENOUS

## 2021-10-19 MED ORDER — BUPIVACAINE LIPOSOME 1.3 % IJ SUSP
INTRAMUSCULAR | Status: DC | PRN
  Administered 2021-10-19: 10 mL via PERINEURAL

## 2021-10-19 MED ORDER — NALOXONE HCL 0.4 MG/ML IJ SOLN: 0.4000 mg | INTRAMUSCULAR | Status: DC | PRN

## 2021-10-19 MED ORDER — EPHEDRINE 5 MG/ML INJ
INTRAVENOUS | Status: AC
  Filled 2021-10-19: qty 5

## 2021-10-19 MED ORDER — ACETAMINOPHEN 10 MG/ML IV SOLN
1000.0000 mg | Freq: Four times a day (QID) | INTRAVENOUS | Status: AC
  Administered 2021-10-19 – 2021-10-20 (×4): 1000 mg via INTRAVENOUS
  Filled 2021-10-19 (×4): qty 100

## 2021-10-19 MED ORDER — PROPOFOL 10 MG/ML IV BOLUS
INTRAVENOUS | Status: DC | PRN
  Administered 2021-10-19: 150 mg via INTRAVENOUS
  Administered 2021-10-19: 50 mg via INTRAVENOUS

## 2021-10-19 MED ORDER — LACTATED RINGERS IV SOLN
INTRAVENOUS | Status: DC
Start: 2021-10-19 — End: 2021-10-19

## 2021-10-19 MED ORDER — METOPROLOL TARTRATE 5 MG/5ML IV SOLN
5.0000 mg | Freq: Four times a day (QID) | INTRAVENOUS | Status: DC
  Administered 2021-10-19 – 2021-10-21 (×8): 5 mg via INTRAVENOUS
  Filled 2021-10-19 (×8): qty 5

## 2021-10-19 MED ORDER — CEFAZOLIN SODIUM-DEXTROSE 2-4 GM/100ML-% IV SOLN
2.0000 g | Freq: Three times a day (TID) | INTRAVENOUS | Status: AC
  Administered 2021-10-19: 2 g via INTRAVENOUS
  Filled 2021-10-19: qty 100

## 2021-10-19 MED ORDER — DIPHENHYDRAMINE HCL 12.5 MG/5ML PO ELIX: 12.5000 mg | ORAL_SOLUTION | Freq: Four times a day (QID) | ORAL | Status: DC | PRN

## 2021-10-19 MED ORDER — CLONIDINE HCL 0.1 MG/24HR TD PTWK
0.1000 mg | MEDICATED_PATCH | TRANSDERMAL | Status: DC
  Administered 2021-10-19: 0.1 mg via TRANSDERMAL
  Filled 2021-10-19: qty 1

## 2021-10-19 MED ORDER — FENTANYL CITRATE (PF) 250 MCG/5ML IJ SOLN
INTRAMUSCULAR | Status: AC
  Filled 2021-10-19: qty 5

## 2021-10-19 MED ORDER — CHLORHEXIDINE GLUCONATE 0.12 % MT SOLN
OROMUCOSAL | Status: AC
  Administered 2021-10-19: 15 mL via OROMUCOSAL
  Filled 2021-10-19: qty 15

## 2021-10-19 MED ORDER — CEFAZOLIN SODIUM-DEXTROSE 2-4 GM/100ML-% IV SOLN
2.0000 g | INTRAVENOUS | Status: AC
  Administered 2021-10-19: 2 g via INTRAVENOUS
  Filled 2021-10-19: qty 100

## 2021-10-19 MED ORDER — CHLORHEXIDINE GLUCONATE 0.12 % MT SOLN: 15.0000 mL | Freq: Once | OROMUCOSAL | Status: AC

## 2021-10-19 MED ORDER — WATER FOR IRRIGATION, STERILE IR SOLN
Status: DC | PRN
  Administered 2021-10-19: 1000 mL

## 2021-10-19 MED ORDER — ROCURONIUM BROMIDE 10 MG/ML (PF) SYRINGE
PREFILLED_SYRINGE | INTRAVENOUS | Status: AC
Start: 2021-10-19 — End: ?
  Filled 2021-10-19: qty 10

## 2021-10-19 MED ORDER — HYDROMORPHONE HCL 1 MG/ML IJ SOLN
INTRAMUSCULAR | Status: AC
  Filled 2021-10-19: qty 1

## 2021-10-19 MED ORDER — ORAL CARE MOUTH RINSE: 15.0000 mL | Freq: Once | OROMUCOSAL | Status: AC

## 2021-10-19 MED ORDER — PROCHLORPERAZINE EDISYLATE 10 MG/2ML IJ SOLN: 5.0000 mg | Freq: Four times a day (QID) | INTRAMUSCULAR | Status: DC | PRN

## 2021-10-19 MED ORDER — DIPHENHYDRAMINE HCL 50 MG/ML IJ SOLN: 12.5000 mg | Freq: Four times a day (QID) | INTRAMUSCULAR | Status: DC | PRN

## 2021-10-19 MED ORDER — ROCURONIUM BROMIDE 10 MG/ML (PF) SYRINGE
PREFILLED_SYRINGE | INTRAVENOUS | Status: DC | PRN
  Administered 2021-10-19: 20 mg via INTRAVENOUS
  Administered 2021-10-19: 10 mg via INTRAVENOUS
  Administered 2021-10-19: 60 mg via INTRAVENOUS

## 2021-10-19 MED ORDER — SUGAMMADEX SODIUM 200 MG/2ML IV SOLN
INTRAVENOUS | Status: DC | PRN
  Administered 2021-10-19: 200 mg via INTRAVENOUS

## 2021-10-19 MED ORDER — ALBUMIN HUMAN 5 % IV SOLN: INTRAVENOUS | Status: DC | PRN

## 2021-10-19 MED ORDER — CHLORHEXIDINE GLUCONATE CLOTH 2 % EX PADS: 6.0000 | MEDICATED_PAD | Freq: Once | CUTANEOUS | Status: DC

## 2021-10-19 MED ORDER — LIDOCAINE HCL 1 % IJ SOLN
INTRAMUSCULAR | Status: DC | PRN
  Administered 2021-10-19: 10 mL via INTRAMUSCULAR

## 2021-10-19 MED ORDER — FENTANYL CITRATE (PF) 250 MCG/5ML IJ SOLN
INTRAMUSCULAR | Status: DC | PRN
  Administered 2021-10-19: 100 ug via INTRAVENOUS
  Administered 2021-10-19 (×2): 50 ug via INTRAVENOUS

## 2021-10-19 MED ORDER — PROCHLORPERAZINE MALEATE 10 MG PO TABS: 10.0000 mg | ORAL_TABLET | Freq: Four times a day (QID) | ORAL | Status: DC | PRN

## 2021-10-19 MED ORDER — DEXAMETHASONE SODIUM PHOSPHATE 10 MG/ML IJ SOLN
INTRAMUSCULAR | Status: AC
  Filled 2021-10-19: qty 1

## 2021-10-19 MED ORDER — ENOXAPARIN SODIUM 40 MG/0.4ML IJ SOSY
40.0000 mg | PREFILLED_SYRINGE | INTRAMUSCULAR | Status: DC
  Administered 2021-10-20 – 2021-10-24 (×5): 40 mg via SUBCUTANEOUS
  Filled 2021-10-19 (×5): qty 0.4

## 2021-10-19 MED ORDER — HYDRALAZINE HCL 20 MG/ML IJ SOLN: 10.0000 mg | INTRAMUSCULAR | Status: DC | PRN

## 2021-10-19 MED ORDER — ENSURE PRE-SURGERY PO LIQD: 296.0000 mL | Freq: Once | ORAL | Status: DC

## 2021-10-19 MED ORDER — LIDOCAINE HCL (PF) 1 % IJ SOLN
INTRAMUSCULAR | Status: AC
Start: 2021-10-19 — End: ?
  Filled 2021-10-19: qty 30

## 2021-10-19 MED ORDER — BUPIVACAINE-EPINEPHRINE (PF) 0.25% -1:200000 IJ SOLN
INTRAMUSCULAR | Status: AC
  Filled 2021-10-19: qty 30

## 2021-10-19 MED ORDER — 0.9 % SODIUM CHLORIDE (POUR BTL) OPTIME
TOPICAL | Status: DC | PRN
  Administered 2021-10-19 (×2): 1000 mL

## 2021-10-19 MED ORDER — HYDROMORPHONE 1 MG/ML IV SOLN
INTRAVENOUS | Status: DC
  Administered 2021-10-19 (×2): 2 mg via INTRAVENOUS
  Administered 2021-10-19: 1.8 mg via INTRAVENOUS
  Administered 2021-10-20: 0.6 mg via INTRAVENOUS
  Administered 2021-10-20: 1.4 mg via INTRAVENOUS
  Administered 2021-10-20: 1.8 mg via INTRAVENOUS
  Administered 2021-10-20: 3 mg via INTRAVENOUS
  Administered 2021-10-20: 1.2 mg via INTRAVENOUS
  Administered 2021-10-21: 0.8 mg via INTRAVENOUS
  Administered 2021-10-21: 0.6 mg via INTRAVENOUS
  Administered 2021-10-21: 1 mg via INTRAVENOUS
  Administered 2021-10-21: 0.6 mg via INTRAVENOUS
  Administered 2021-10-21: 1.4 mg via INTRAVENOUS
  Filled 2021-10-19: qty 30

## 2021-10-19 MED ORDER — OXYCODONE HCL 5 MG/5ML PO SOLN: 5.0000 mg | Freq: Once | ORAL | Status: DC | PRN

## 2021-10-19 MED ORDER — AMISULPRIDE (ANTIEMETIC) 5 MG/2ML IV SOLN: 10.0000 mg | Freq: Once | INTRAVENOUS | Status: DC | PRN

## 2021-10-19 MED ORDER — PANTOPRAZOLE SODIUM 40 MG IV SOLR
40.0000 mg | Freq: Every day | INTRAVENOUS | Status: DC
Start: 2021-10-19 — End: 2021-10-23
  Administered 2021-10-19 – 2021-10-22 (×4): 40 mg via INTRAVENOUS
  Filled 2021-10-19 (×4): qty 10

## 2021-10-19 MED ORDER — LATANOPROST 0.005 % OP SOLN
1.0000 [drp] | Freq: Every day | OPHTHALMIC | Status: DC
  Administered 2021-10-19 – 2021-10-23 (×5): 1 [drp] via OPHTHALMIC
  Filled 2021-10-19: qty 2.5

## 2021-10-19 MED ORDER — MAGNESIUM SULFATE 50 % IJ SOLN
INTRAMUSCULAR | Status: DC | PRN
  Administered 2021-10-19: 2 g via INTRAVENOUS

## 2021-10-19 MED ORDER — ONDANSETRON HCL 4 MG/2ML IJ SOLN: 4.0000 mg | Freq: Four times a day (QID) | INTRAMUSCULAR | Status: DC | PRN

## 2021-10-19 MED ORDER — HEMOSTATIC AGENTS (NO CHARGE) OPTIME
TOPICAL | Status: DC | PRN
  Administered 2021-10-19: 1 via TOPICAL

## 2021-10-19 MED ORDER — LACTATED RINGERS IV SOLN: INTRAVENOUS | Status: DC | PRN

## 2021-10-19 MED ORDER — KCL IN DEXTROSE-NACL 20-5-0.45 MEQ/L-%-% IV SOLN
INTRAVENOUS | Status: DC
  Filled 2021-10-19 (×5): qty 1000

## 2021-10-19 MED ORDER — DEXAMETHASONE SODIUM PHOSPHATE 10 MG/ML IJ SOLN
INTRAMUSCULAR | Status: DC | PRN
  Administered 2021-10-19: 5 mg via INTRAVENOUS

## 2021-10-19 MED ORDER — METHOCARBAMOL 1000 MG/10ML IJ SOLN: 500.0000 mg | Freq: Three times a day (TID) | INTRAVENOUS | Status: DC | PRN

## 2021-10-19 MED ORDER — ACETAMINOPHEN 500 MG PO TABS
1000.0000 mg | ORAL_TABLET | ORAL | Status: AC
  Administered 2021-10-19: 1000 mg via ORAL
  Filled 2021-10-19: qty 2

## 2021-10-19 MED ORDER — SODIUM CHLORIDE 0.9 % IV SOLN: INTRAVENOUS | Status: DC | PRN

## 2021-10-19 MED ORDER — SODIUM CHLORIDE 0.9% FLUSH: 9.0000 mL | INTRAVENOUS | Status: DC | PRN

## 2021-10-19 MED ORDER — ONDANSETRON 4 MG PO TBDP: 4.0000 mg | ORAL_TABLET | Freq: Four times a day (QID) | ORAL | Status: DC | PRN

## 2021-10-19 MED ORDER — ENSURE PRE-SURGERY PO LIQD: 592.0000 mL | Freq: Once | ORAL | Status: DC

## 2021-10-19 MED ORDER — OXYMETAZOLINE HCL 0.05 % NA SOLN
NASAL | Status: AC
  Filled 2021-10-19: qty 30

## 2021-10-19 MED ORDER — ONDANSETRON HCL 4 MG/2ML IJ SOLN
4.0000 mg | Freq: Four times a day (QID) | INTRAMUSCULAR | Status: DC | PRN
Start: 2021-10-19 — End: 2021-10-22

## 2021-10-19 MED ORDER — ACETAMINOPHEN 500 MG PO TABS: 1000.0000 mg | ORAL_TABLET | Freq: Once | ORAL | Status: DC

## 2021-10-19 MED ORDER — EPHEDRINE SULFATE-NACL 50-0.9 MG/10ML-% IV SOSY
PREFILLED_SYRINGE | INTRAVENOUS | Status: DC | PRN
  Administered 2021-10-19: 7.5 mg via INTRAVENOUS
  Administered 2021-10-19: 2.5 mg via INTRAVENOUS
  Administered 2021-10-19: 5 mg via INTRAVENOUS

## 2021-10-19 MED ORDER — VASOPRESSIN 20 UNIT/ML IV SOLN
INTRAVENOUS | Status: AC
  Filled 2021-10-19: qty 1

## 2021-10-19 MED ORDER — PHENYLEPHRINE HCL-NACL 20-0.9 MG/250ML-% IV SOLN
INTRAVENOUS | Status: DC | PRN
  Administered 2021-10-19: 40 ug/min via INTRAVENOUS

## 2021-10-19 MED ORDER — OXYCODONE HCL 5 MG PO TABS: 5.0000 mg | ORAL_TABLET | Freq: Once | ORAL | Status: DC | PRN

## 2021-10-19 SURGICAL SUPPLY — 128 items
BAG BILE T-TUBES STRL (MISCELLANEOUS) ×4 IMPLANT
BAG COUNTER SPONGE SURGICOUNT (BAG) ×2 IMPLANT
BIOPATCH RED 1 DISK 7.0 (GAUZE/BANDAGES/DRESSINGS) ×4 IMPLANT
BLADE CLIPPER SURG (BLADE) IMPLANT
BLADE SURG 10 STRL SS (BLADE) ×2 IMPLANT
BOOT SUTURE AID YELLOW STND (SUTURE) ×4 IMPLANT
CANISTER SUCT 3000ML PPV (MISCELLANEOUS) ×2 IMPLANT
CATH KIT ON-Q SILVERSOAK 7.5 (CATHETERS) IMPLANT
CATH KIT ON-Q SILVERSOAK 7.5IN (CATHETERS) IMPLANT
CATH ROBINSON RED A/P 16FR (CATHETERS) IMPLANT
CHLORAPREP W/TINT 26 (MISCELLANEOUS) ×2 IMPLANT
CLIP LIGATING HEM O LOK PURPLE (MISCELLANEOUS) ×2 IMPLANT
CLIP LIGATING HEMO O LOK GREEN (MISCELLANEOUS) ×3 IMPLANT
CLIP LIGATING HEMOLOK MED (MISCELLANEOUS) ×1 IMPLANT
CLIP TI LARGE 6 (CLIP) ×1 IMPLANT
CLIP TI MEDIUM 24 (CLIP) ×1 IMPLANT
CLIP VESOCCLUDE LG 6/CT (CLIP) ×2 IMPLANT
CLIP VESOCCLUDE MED 24/CT (CLIP) ×2 IMPLANT
CLIP VESOLOCK LG 6/CT PURPLE (CLIP) ×4 IMPLANT
CLIP VESOLOCK MED 6/CT (CLIP) ×2 IMPLANT
CNTNR URN SCR LID CUP LEK RST (MISCELLANEOUS) IMPLANT
CONT SPEC 4OZ STRL OR WHT (MISCELLANEOUS)
COUNTER NEEDLE 20 DBL MAG RED (NEEDLE) IMPLANT
COVER MAYO STAND STRL (DRAPES) ×2 IMPLANT
COVER PROBE W GEL 5X96 (DRAPES) ×1 IMPLANT
COVER SURGICAL LIGHT HANDLE (MISCELLANEOUS) ×2 IMPLANT
DERMABOND ADVANCED (GAUZE/BANDAGES/DRESSINGS) ×1
DERMABOND ADVANCED .7 DNX12 (GAUZE/BANDAGES/DRESSINGS) ×1 IMPLANT
DRAIN CHANNEL 19F RND (DRAIN) ×4 IMPLANT
DRAIN PENROSE 0.5X18 (DRAIN) IMPLANT
DRAPE LAPAROSCOPIC ABDOMINAL (DRAPES) ×2 IMPLANT
DRAPE WARM FLUID 44X44 (DRAPES) ×2 IMPLANT
DRSG COVADERM 4X10 (GAUZE/BANDAGES/DRESSINGS) IMPLANT
DRSG COVADERM 4X14 (GAUZE/BANDAGES/DRESSINGS) ×1 IMPLANT
DRSG COVADERM 4X6 (GAUZE/BANDAGES/DRESSINGS) IMPLANT
DRSG COVADERM 4X8 (GAUZE/BANDAGES/DRESSINGS) IMPLANT
DRSG COVADERM PLUS 2X2 (GAUZE/BANDAGES/DRESSINGS) ×3 IMPLANT
DRSG TEGADERM 4X4.75 (GAUZE/BANDAGES/DRESSINGS) ×2 IMPLANT
DRSG TELFA 3X8 NADH (GAUZE/BANDAGES/DRESSINGS) IMPLANT
ELECT BLADE 4.0 EZ CLEAN MEGAD (MISCELLANEOUS)
ELECT BLADE 6.5 EXT (BLADE) ×2 IMPLANT
ELECT CAUTERY BLADE 6.4 (BLADE) ×2 IMPLANT
ELECT REM PT RETURN 9FT ADLT (ELECTROSURGICAL) ×2
ELECTRODE BLDE 4.0 EZ CLN MEGD (MISCELLANEOUS) IMPLANT
ELECTRODE REM PT RTRN 9FT ADLT (ELECTROSURGICAL) ×1 IMPLANT
GAUZE 4X4 16PLY ~~LOC~~+RFID DBL (SPONGE) IMPLANT
GAUZE SPONGE 4X4 12PLY STRL (GAUZE/BANDAGES/DRESSINGS) IMPLANT
GEL ULTRASOUND 20GR AQUASONIC (MISCELLANEOUS) IMPLANT
GLOVE BIO SURGEON STRL SZ 6 (GLOVE) ×4 IMPLANT
GLOVE INDICATOR 6.5 STRL GRN (GLOVE) ×4 IMPLANT
GOWN STRL REUS W/ TWL LRG LVL3 (GOWN DISPOSABLE) ×2 IMPLANT
GOWN STRL REUS W/TWL 2XL LVL3 (GOWN DISPOSABLE) ×4 IMPLANT
GOWN STRL REUS W/TWL LRG LVL3 (GOWN DISPOSABLE) ×4
HANDLE SUCTION POOLE (INSTRUMENTS) IMPLANT
HEMOSTAT SNOW SURGICEL 2X4 (HEMOSTASIS) ×1 IMPLANT
HEMOSTAT SURGICEL 2X14 (HEMOSTASIS) IMPLANT
KIT BASIN OR (CUSTOM PROCEDURE TRAY) ×2 IMPLANT
KIT MARKER MARGIN INK (KITS) ×2 IMPLANT
KIT TUBE JEJUNAL 16FR (CATHETERS) ×2 IMPLANT
KIT TURNOVER KIT B (KITS) ×2 IMPLANT
L-HOOK LAP DISP 36CM (ELECTROSURGICAL) ×2
LHOOK LAP DISP 36CM (ELECTROSURGICAL) ×1 IMPLANT
LOOP VESSEL MAXI BLUE (MISCELLANEOUS) ×2 IMPLANT
LOOP VESSEL MINI RED (MISCELLANEOUS) ×2 IMPLANT
NEEDLE 22X1 1/2 (OR ONLY) (NEEDLE) ×2 IMPLANT
NS IRRIG 1000ML POUR BTL (IV SOLUTION) ×4 IMPLANT
PACK GENERAL/GYN (CUSTOM PROCEDURE TRAY) ×2 IMPLANT
PAD ARMBOARD 7.5X6 YLW CONV (MISCELLANEOUS) ×4 IMPLANT
PAD DRESSING TELFA 3X8 NADH (GAUZE/BANDAGES/DRESSINGS) IMPLANT
PENCIL BUTTON HOLSTER BLD 10FT (ELECTRODE) ×2 IMPLANT
PENCIL SMOKE EVACUATOR (MISCELLANEOUS) ×2 IMPLANT
PLUG CATH AND CAP STER (CATHETERS) IMPLANT
RELOAD PROXIMATE 75MM BLUE (ENDOMECHANICALS) IMPLANT
RELOAD PROXIMATE 75MM GREEN (ENDOMECHANICALS) IMPLANT
RELOAD STAPLE 75 3.8 BLU REG (ENDOMECHANICALS) IMPLANT
RELOAD STAPLE 75 4.5 GRN THCK (ENDOMECHANICALS) IMPLANT
SCISSORS LAP 5X35 DISP (ENDOMECHANICALS) IMPLANT
SEPRAFILM PROCEDURAL PACK 3X5 (MISCELLANEOUS) IMPLANT
SET IRRIG TUBING LAPAROSCOPIC (IRRIGATION / IRRIGATOR) IMPLANT
SET TUBE SMOKE EVAC HIGH FLOW (TUBING) ×2 IMPLANT
SHEARS FOC LG CVD HARMONIC 17C (MISCELLANEOUS) ×2 IMPLANT
SLEEVE ENDOPATH XCEL 5M (ENDOMECHANICALS) ×2 IMPLANT
SLEEVE SUCTION 125 (MISCELLANEOUS) IMPLANT
SLEEVE SUCTION CATH 165 (SLEEVE) ×2 IMPLANT
SPIKE FLUID TRANSFER (MISCELLANEOUS) ×4 IMPLANT
SPONGE INTESTINAL PEANUT (DISPOSABLE) IMPLANT
SPONGE SURGIFOAM ABS GEL 100 (HEMOSTASIS) IMPLANT
SPONGE T-LAP 18X18 ~~LOC~~+RFID (SPONGE) ×4 IMPLANT
STAPLER PROXIMATE 75MM BLUE (STAPLE) ×2 IMPLANT
STAPLER VISISTAT 35W (STAPLE) ×2 IMPLANT
SUCTION POOLE HANDLE (INSTRUMENTS) ×2
SUT 5.0 PDS RB-1 (SUTURE) ×10
SUT ETHILON 2 0 FS 18 (SUTURE) ×4 IMPLANT
SUT ETHILON 2 LR (SUTURE) IMPLANT
SUT MNCRL AB 4-0 PS2 18 (SUTURE) ×1 IMPLANT
SUT PDS AB 1 TP1 96 (SUTURE) ×4 IMPLANT
SUT PDS AB 3-0 SH 27 (SUTURE) ×6 IMPLANT
SUT PDS AB 4-0 RB1 27 (SUTURE) ×11 IMPLANT
SUT PDS PLUS AB 5-0 RB-1 (SUTURE) ×5 IMPLANT
SUT PROLENE 3 0 SH 48 (SUTURE) ×4 IMPLANT
SUT PROLENE 4 0 RB 1 (SUTURE) ×4
SUT PROLENE 4-0 RB1 .5 CRCL 36 (SUTURE) ×2 IMPLANT
SUT PROLENE 5 0 RB 1 DA (SUTURE) IMPLANT
SUT SILK 2 0 SH CR/8 (SUTURE) ×2 IMPLANT
SUT SILK 2 0 TIES 10X30 (SUTURE) ×2 IMPLANT
SUT SILK 3 0 SH CR/8 (SUTURE) ×2 IMPLANT
SUT SILK 3 0 TIES 10X30 (SUTURE) ×2 IMPLANT
SUT VIC AB 2-0 CT1 27 (SUTURE)
SUT VIC AB 2-0 CT1 TAPERPNT 27 (SUTURE) IMPLANT
SUT VIC AB 2-0 SH 18 (SUTURE) IMPLANT
SUT VIC AB 3-0 SH 18 (SUTURE) IMPLANT
SUT VIC AB 3-0 SH 27 (SUTURE) ×2
SUT VIC AB 3-0 SH 27X BRD (SUTURE) ×1 IMPLANT
SUT VICRYL AB 2 0 TIES (SUTURE) IMPLANT
TAPE UMBILICAL 1/8 X36 TWILL (MISCELLANEOUS) IMPLANT
TOWEL GREEN STERILE (TOWEL DISPOSABLE) ×2 IMPLANT
TOWEL GREEN STERILE FF (TOWEL DISPOSABLE) ×2 IMPLANT
TRAY FOLEY MTR SLVR 14FR STAT (SET/KITS/TRAYS/PACK) ×2 IMPLANT
TRAY LAPAROSCOPIC MC (CUSTOM PROCEDURE TRAY) ×2 IMPLANT
TROCAR XCEL BLUNT TIP 100MML (ENDOMECHANICALS) IMPLANT
TROCAR XCEL NON-BLD 11X100MML (ENDOMECHANICALS) IMPLANT
TROCAR Z-THREAD OPTICAL 5X100M (TROCAR) ×2 IMPLANT
TUBE CONNECTING 12X1/4 (SUCTIONS) ×2 IMPLANT
TUBE FEEDING 8FR 16IN STR KANG (MISCELLANEOUS) IMPLANT
TUBE FEEDING ENTERAL 5FR 16IN (TUBING) IMPLANT
TUNNELER SHEATH ON-Q 16GX12 DP (PAIN MANAGEMENT) IMPLANT
WARMER LAPAROSCOPE (MISCELLANEOUS) ×2 IMPLANT
YANKAUER SUCT BULB TIP NO VENT (SUCTIONS) ×2 IMPLANT

## 2021-10-19 NOTE — Transfer of Care (Signed)
Immediate Anesthesia Transfer of Care Note  Patient: Aaron Moss  Procedure(s) Performed: LAPAROSCOPY DIAGNOSTIC (Abdomen) ATTEMPTED WHIPPLE PROCEDURE  WITH GASTRODUODENAL BYPASS (Abdomen) CHOLECYSTECTOMY (Abdomen)  Patient Location: PACU  Anesthesia Type:General and Regional  Level of Consciousness: drowsy  Airway & Oxygen Therapy: Patient Spontanous Breathing and Patient connected to face mask oxygen  Post-op Assessment: Report given to RN, Post -op Vital signs reviewed and stable and Patient moving all extremities X 4  Post vital signs: Reviewed and stable  Last Vitals:  Vitals Value Taken Time  BP 153/65 10/19/21 1122  Temp    Pulse 65 10/19/21 1125  Resp 17 10/19/21 1125  SpO2 100 % 10/19/21 1125  Vitals shown include unvalidated device data.  Last Pain:  Vitals:   10/19/21 1314  TempSrc:   PainSc: 0-No pain         Complications: No notable events documented.

## 2021-10-19 NOTE — Op Note (Signed)
PRE-OPERATIVE DIAGNOSIS: Duodenal cancer, status post neoadjuvant chemotherapy, [id number]  POST-OPERATIVE DIAGNOSIS:  Same  PROCEDURE:  Procedure(s): Diagnostic laparoscopy, exploratory laparotomy, portal exploration, cholecystectomy, loop gastrojejunostomy  SURGEON:  Surgeon(s): [name], MD  ASSISTANT:  Michaelle Birks, MD  ANESTHESIA:   general and TAP block  DRAINS: none   LOCAL MEDICATIONS USED:  BUPIVICAINE  and LIDOCAINE   SPECIMEN:  Source of Specimen:  gallbladder  DISPOSITION OF SPECIMEN:  PATHOLOGY  COUNTS:  YES  DICTATION: .Dragon Dictation  PLAN OF CARE: Admit to inpatient   PATIENT DISPOSITION:  PACU - hemodynamically stable.  FINDINGS:  common hepatic artery involved in the tumor  EBL: min  PROCEDURE:  The patient was identified in the holding area and taken the operating room where he was placed supine on the operating room table.  General anesthesia was induced.  Foley catheter was placed as well as sequential compression devices on his lower extremities.  This left arm was tucked.  His abdomen was then prepped and draped in sterile fashion.  A timeout was performed according to the surgical safety checklist.  When all was correct, we continued.  Patient was placed into reverse Trendelenburg position and rotated to the right.  Local anesthetic was administered at the left costal margin.  A small incision was made with a #11 blade.  A 5 mm Optiview trocar was used to access the abdomen in the left upper quadrant carefully under direct visualization.  The pneumoperitoneum was achieved to a pressure of 15 mmHg.  The abdomen was examined with a angled scope and no evidence of carcinomatosis was seen.  A second port was placed in the right upper quadrant to visualize the right lateral liver and up against the right side of the falciform ligament.  No evidence of carcinomatosis was seen there either.  The liver was elevated and it was concerning that the tumor had  tethered to the mesentery of the colon as well as looking like it was rolling up onto the porta hepatis.  A midline incision was made with a #10 blade.  The cautery was used to divide the subcutaneous tissues and enter the fascia as well as the peritoneum.  The fascial incision was carried out the length of the skin incision which went from the xiphoid to just above the umbilicus.  The Bookwalter retractor was placed for visualization.  The liver surface was palpated and no nodules were felt under the surface.  The colon was retracted downward.  The gallbladder was first removed with the cautery and the assistance of blunt dissection in order to access the portal anatomy better.  The cystic artery was divided after clipping with a Hem-o-lok clip.  The cystic duct was doubly clipped with Hem-o-lok clips and then transected.  Blunt dissection of the porta then commenced and the bile duct was isolated laterally.  A blue vessel loop was placed around it.  The common hepatic artery and the proper hepatic artery were identified.  The bifurcation of the proper hepatic artery was very low next to the tumor.  The bifurcation of the common hepatic artery into the gastroduodenal artery and the proper hepatic artery was not able to be visualized.  The tumor completely obscured this.  This was rockhard and this region was not amenable to dissecting out the common hepatic to the point where the proper hepatic could be freed up from the gastroduodenal.  The decision was made to abort the Whipple procedure.  Tumor was also seen to be  starting to creep past the pylorus internally.  This appeared to be imminently obstructing the gastric outlet.  A gastrojejunostomy was then performed.  The patient had already had a portion of transverse colon removed and there were adhesions of the small bowel in the left upper quadrant.  These were freed up and the ligament of Treitz was identified.  Loop gastrojejunostomy was created  approximately 40 cm past the ligament of Treitz.  The small bowel was secured to the posterior stomach with 3-0 silk sutures.  The stomach and small bowel were opened and connected with a blue load of the GIA 75 mm stapler.  The defect was then closed with 2 running looped 3-0 PDS sutures, all fashion.  A few additional 3-0 silk sutures were placed at either end of the staple anastomosis to help secure it in place.  Prior to complete closure of the defect the nasogastric tube was passed into the efferent limb of the gastrojejunostomy.  The tube was then secured in place.  The abdomen was then irrigated.  The lap count and instrument count was performed which was correct.  The fascia was closed with running #1 looped PDS suture x2.  The skin was then irrigated and closed with skin staples.  The wounds were then cleaned, dried, and dressed with dry sterile dressings.  The patient was allowed to emerge from anesthesia and taken to the PACU in stable condition.  Needle, sponge, and instrument counts were correct x2.

## 2021-10-19 NOTE — Anesthesia Procedure Notes (Signed)
Arterial Line Insertion Start/End8/04/2021 8:45 AM, 10/19/2021 8:58 AM Performed by: Pervis Hocking, DO, anesthesiologist  Preanesthetic checklist: patient identified, IV checked, site marked, risks and benefits discussed, surgical consent, monitors and equipment checked, pre-op evaluation, timeout performed and anesthesia consent Left, radial was placed Catheter size: 20 G Hand hygiene performed  and maximum sterile barriers used   Procedure performed using ultrasound guided technique. Ultrasound Notes:anatomy identified and needle tip was noted to be adjacent to the nerve/plexus identified Following insertion, Biopatch and dressing applied. Post procedure assessment: normal and unchanged  Patient tolerated the procedure well with no immediate complications.

## 2021-10-19 NOTE — Anesthesia Postprocedure Evaluation (Signed)
Anesthesia Post Note  Patient: Aaron Moss  Procedure(s) Performed: LAPAROSCOPY DIAGNOSTIC (Abdomen) ATTEMPTED WHIPPLE PROCEDURE  WITH GASTRODUODENAL BYPASS (Abdomen) CHOLECYSTECTOMY (Abdomen)     Patient location during evaluation: PACU Anesthesia Type: Regional and General Level of consciousness: awake and alert, oriented and patient cooperative Pain management: pain level controlled Vital Signs Assessment: post-procedure vital signs reviewed and stable Respiratory status: spontaneous breathing, nonlabored ventilation and respiratory function stable Cardiovascular status: blood pressure returned to baseline and stable Postop Assessment: no apparent nausea or vomiting Anesthetic complications: no   No notable events documented.  Last Vitals:  Vitals:   10/19/21 1245 10/19/21 1300  BP: (!) 147/59 (!) 141/60  Pulse: 60 61  Resp: 10 17  Temp:    SpO2: 100% 100%    Last Pain:  Vitals:   10/19/21 1245  TempSrc:   PainSc: Fisher

## 2021-10-19 NOTE — Anesthesia Procedure Notes (Addendum)
Anesthesia Regional Block: TAP block   Pre-Anesthetic Checklist: , timeout performed,  Correct Patient, Correct Site, Correct Laterality,  Correct Procedure, Correct Position, site marked,  Risks and benefits discussed,  Surgical consent,  Pre-op evaluation,  At surgeon's request and post-op pain management  Laterality: Left and Right  Prep: Maximum Sterile Barrier Precautions used, chloraprep       Needles:  Injection technique: Single-shot  Needle Type: Echogenic Stimulator Needle     Needle Length: 9cm  Needle Gauge: 22     Additional Needles:   Procedures:,,,, ultrasound used (permanent image in chart),,    Narrative:  Start time: 10/19/2021 8:45 AM End time: 10/19/2021 8:50 AM Injection made incrementally with aspirations every 5 mL.  Performed by: Personally  Anesthesiologist: Pervis Hocking, DO  Additional Notes: Monitors applied. No increased pain on injection. No increased resistance to injection. Injection made in 5cc increments. Good needle visualization. Patient tolerated procedure well.

## 2021-10-19 NOTE — Interval H&P Note (Signed)
History and Physical Interval Note:  10/19/2021 8:28 AM  Aaron Moss  has presented today for surgery, with the diagnosis of DUODENAL CANCER.  The various methods of treatment have been discussed with the patient and family. After consideration of risks, benefits and other options for treatment, the patient has consented to  Procedure(s) with comments: LAPAROSCOPY DIAGNOSTIC (N/A) - GENERAL AND TAP BLOCK POSSIBLE WHIPPLE PROCEDURE (N/A) - GENERAL AND TAP BLOCK as a surgical intervention.  The patient's history has been reviewed, patient examined, no change in status, stable for surgery.  I have reviewed the patient's chart and labs.  Questions were answered to the patient's satisfaction.     [name]

## 2021-10-19 NOTE — Anesthesia Procedure Notes (Signed)
Procedure Name: Intubation Date/Time: 10/19/2021 8:44 AM  Performed by: Glynda Jaeger, CRNAPre-anesthesia Checklist: Patient identified, Patient being monitored, Timeout performed, Emergency Drugs available and Suction available Patient Re-evaluated:Patient Re-evaluated prior to induction Oxygen Delivery Method: Circle System Utilized Preoxygenation: Pre-oxygenation with 100% oxygen Induction Type: IV induction Ventilation: Mask ventilation without difficulty Laryngoscope Size: Mac and 4 Grade View: Grade I Tube type: Oral Tube size: 7.5 mm Number of attempts: 1 Airway Equipment and Method: Stylet Placement Confirmation: ETT inserted through vocal cords under direct vision, positive ETCO2 and breath sounds checked- equal and bilateral Secured at: 21 cm Tube secured with: Tape Dental Injury: Teeth and Oropharynx as per pre-operative assessment

## 2021-10-20 ENCOUNTER — Encounter (HOSPITAL_COMMUNITY): Payer: Self-pay | Admitting: General Surgery

## 2021-10-20 LAB — POCT I-STAT 7, (LYTES, BLD GAS, ICA,H+H)
Acid-Base Excess: 3 mmol/L — ABNORMAL HIGH (ref 0.0–2.0)
Acid-Base Excess: 1 mmol/L (ref 0.0–2.0)
Bicarbonate: 29 mmol/L — ABNORMAL HIGH (ref 20.0–28.0)
Bicarbonate: 27.1 mmol/L (ref 20.0–28.0)
Calcium, Ion: 1.23 mmol/L (ref 1.15–1.40)
Calcium, Ion: 1.22 mmol/L (ref 1.15–1.40)
HCT: 29 % — ABNORMAL LOW (ref 39.0–52.0)
HCT: 27 % — ABNORMAL LOW (ref 39.0–52.0)
Hemoglobin: 9.9 g/dL — ABNORMAL LOW (ref 13.0–17.0)
Hemoglobin: 9.2 g/dL — ABNORMAL LOW (ref 13.0–17.0)
O2 Saturation: 100 %
O2 Saturation: 100 %
Potassium: 3.1 mmol/L — ABNORMAL LOW (ref 3.5–5.1)
Potassium: 3.2 mmol/L — ABNORMAL LOW (ref 3.5–5.1)
Sodium: 143 mmol/L (ref 135–145)
Sodium: 143 mmol/L (ref 135–145)
TCO2: 30 mmol/L (ref 22–32)
TCO2: 29 mmol/L (ref 22–32)
pCO2 arterial: 48.3 mmHg — ABNORMAL HIGH (ref 32–48)
pCO2 arterial: 47.5 mmHg (ref 32–48)
pH, Arterial: 7.386 (ref 7.35–7.45)
pH, Arterial: 7.365 (ref 7.35–7.45)
pO2, Arterial: 263 mmHg — ABNORMAL HIGH (ref 83–108)
pO2, Arterial: 188 mmHg — ABNORMAL HIGH (ref 83–108)

## 2021-10-20 LAB — COMPREHENSIVE METABOLIC PANEL
ALT: 37 U/L (ref 0–44)
AST: 35 U/L (ref 15–41)
Albumin: 2.7 g/dL — ABNORMAL LOW (ref 3.5–5.0)
Alkaline Phosphatase: 93 U/L (ref 38–126)
Anion gap: 7 (ref 5–15)
BUN: 14 mg/dL (ref 8–23)
CO2: 24 mmol/L (ref 22–32)
Calcium: 8.5 mg/dL — ABNORMAL LOW (ref 8.9–10.3)
Chloride: 109 mmol/L (ref 98–111)
Creatinine, Ser: 1.15 mg/dL (ref 0.61–1.24)
GFR, Estimated: >60 mL/min (ref >60)
Glucose, Bld: 143 mg/dL — ABNORMAL HIGH (ref 70–99)
Potassium: 3.6 mmol/L (ref 3.5–5.1)
Sodium: 140 mmol/L (ref 135–145)
Total Bilirubin: 1.2 mg/dL (ref 0.3–1.2)
Total Protein: 5.9 g/dL — ABNORMAL LOW (ref 6.5–8.1)

## 2021-10-20 LAB — CBC
HCT: 33.4 % — ABNORMAL LOW (ref 39.0–52.0)
Hemoglobin: 10.3 g/dL — ABNORMAL LOW (ref 13.0–17.0)
MCH: 23.1 pg — ABNORMAL LOW (ref 26.0–34.0)
MCHC: 30.8 g/dL (ref 30.0–36.0)
MCV: 75.1 fL — ABNORMAL LOW (ref 80.0–100.0)
Platelets: 273 10*3/uL (ref 150–400)
RBC: 4.45 MIL/uL (ref 4.22–5.81)
RDW: 18.9 % — ABNORMAL HIGH (ref 11.5–15.5)
WBC: 18.2 10*3/uL — ABNORMAL HIGH (ref 4.0–10.5)
nRBC: 0 % (ref 0.0–0.2)

## 2021-10-20 LAB — PROTIME-INR
INR: 1.2 (ref 0.8–1.2)
Prothrombin Time: 15.1 seconds (ref 11.4–15.2)

## 2021-10-20 LAB — PHOSPHORUS: Phosphorus: 4.2 mg/dL (ref 2.5–4.6)

## 2021-10-20 LAB — SURGICAL PATHOLOGY

## 2021-10-20 LAB — MAGNESIUM: Magnesium: 1.8 mg/dL (ref 1.7–2.4)

## 2021-10-20 MED ORDER — PHENOL 1.4 % MT LIQD
1.0000 | OROMUCOSAL | Status: DC | PRN
  Administered 2021-10-20: 1 via OROMUCOSAL
  Filled 2021-10-20: qty 177

## 2021-10-20 NOTE — Progress Notes (Signed)
1 Day Post-Op   Subjective/Chief Complaint: Pt had unresectable disease.  Pain control OK overnight.   Objective: Vital signs in last 24 hours: Temp:  [97.5 F (36.4 C)-98.9 F (37.2 C)] 98.6 F (37 C) (08/03 0802) Pulse Rate:  [60-70] 66 (08/03 0802) Resp:  [8-18] 17 (08/03 0802) BP: (105-153)/(55-94) 135/94 (08/03 0802) SpO2:  [97 %-100 %] 99 % (08/03 0802) Last BM Date : 10/18/21  Intake/Output from previous day: 08/02 0701 - 08/03 0700 In: 3642 [I.V.:2992; IV Piggyback:650] Out: 3300 [Urine:3200; Blood:100] Intake/Output this shift: No intake/output data recorded.  General appearance: alert and mild distress Resp: breathing comfortably GI: soft, approp tender, non distended Extremities: extremities normal, atraumatic, no cyanosis or edema  Lab Results:  Recent Labs    10/19/21 1436 10/20/21 0144  WBC 16.3* 18.2*  HGB 11.2* 10.3*  HCT 36.4* 33.4*  PLT 262 273   BMET Recent Labs    10/19/21 1436 10/20/21 0144  NA  --  140  K  --  3.6  CL  --  109  CO2  --  24  GLUCOSE  --  143*  BUN  --  14  CREATININE 0.92 1.15  CALCIUM  --  8.5*   PT/INR Recent Labs    10/20/21 0144  LABPROT 15.1  INR 1.2   ABG No results for input(s): "PHART", "HCO3" in the last 72 hours.  Invalid input(s): "PCO2", "PO2"  Studies/Results: No results found.  Anti-infectives: Anti-infectives (From admission, onward)    Start     Dose/Rate Route Frequency Ordered Stop   10/19/21 1530  ceFAZolin (ANCEF) IVPB 2g/100 mL premix        2 g 200 mL/hr over 30 Minutes Intravenous Every 8 hours 10/19/21 1438 10/19/21 1744   10/19/21 0630  ceFAZolin (ANCEF) IVPB 2g/100 mL premix        2 g 200 mL/hr over 30 Minutes Intravenous On call to O.R. 10/19/21 5003 10/19/21 0913       Assessment/Plan: s/p Procedure(s) with comments: LAPAROSCOPY DIAGNOSTIC, ATTEMPTED WHIPPLE PROCEDURE  WITH gastrojejunostomy and CHOLECYSTECTOMY     Unresectable disease. Await return of bowel  function. Plan d/c NGT and foley tomorrow Clonidine and metoprolol for HTN.  PT consult      LOS: 1 day    Aaron Moss 10/20/2021

## 2021-10-20 NOTE — Evaluation (Signed)
Physical Therapy Evaluation Patient Details Name: Aaron Moss MRN: [id number] DOB: Aug 18, 1945 Today's Date: 10/20/2021  History of Present Illness  76 y.o. 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 PMH: GERD, HLD, HTN, Cx history, anemia.  Clinical Impression  Pt presents with impairments in strength, balance, and activity tolerance. Pt tolerates therapy well today, ambulating from his bed to recliner chair with an AD. Pt's ambulation distances are limited by N/G tube on suction. Continued therapy will assist the pt in improving strength, balance, and activity tolerance for progression towards his baseline level of function and independence.        Recommendations for follow up therapy are one component of a multi-disciplinary discharge planning process, led by the attending physician.  Recommendations may be updated based on patient status, additional functional criteria and insurance authorization.  Follow Up Recommendations Home health PT      Assistance Recommended at Discharge Intermittent Supervision/Assistance  Patient can return home with the following  A little help with walking and/or transfers;A little help with bathing/dressing/bathroom;Assistance with cooking/housework;Assist for transportation;Help with stairs or ramp for entrance    Equipment Recommendations Rolling walker (2 wheels)  Recommendations for Other Services       Functional Status Assessment Patient has had a recent decline in their functional status and demonstrates the ability to make significant improvements in function in a reasonable and predictable amount of time.     Precautions / Restrictions Precautions Precautions: Fall;Other (comment) (N/G tube on suction; PCA pump) Restrictions Weight Bearing Restrictions: No       Mobility  Bed Mobility Overal bed mobility: Needs Assistance Bed Mobility: Supine to Sit     Supine to sit: Supervision     General bed mobility comments: PT educates for rolling in bed to protect abdominal incision    Transfers Overall transfer level: Needs assistance Equipment used: Rolling walker (2 wheels) Transfers: Sit to/from Stand Sit to Stand: Min guard, From elevated surface           General transfer comment: Cues for hand placement and proximity to RW    Ambulation/Gait Ambulation/Gait assistance: Min guard Gait Distance (Feet): 10 Feet Assistive device: Rolling walker (2 wheels) Gait Pattern/deviations: Step-through pattern, Decreased stride length, Trunk flexed Gait velocity: reduced Gait velocity interpretation: <1.31 ft/sec, indicative of household ambulator   General Gait Details: Pt ambulates from bed to chair with slowed, but functional gait; PT educates for posture due to pt's trunk flexed - pt reports that he feels like his incision is stretching when he ambulates or stands  Financial trader Rankin (Stroke Patients Only)       Balance Overall balance assessment: Needs assistance Sitting-balance support: No upper extremity supported, Feet supported Sitting balance-Leahy Scale: Fair     Standing balance support: Bilateral upper extremity supported, During functional activity, Reliant on assistive device for balance Standing balance-Leahy Scale: Poor                               Pertinent Vitals/Pain Pain Assessment Pain Assessment: No/denies pain    Home Living Family/patient expects to be discharged to:: Private residence Living Arrangements: Spouse/significant other;Other relatives (Granddaughter and great grandkids) Available Help at Discharge: Family;Available 24 hours/day (Wife cannot physically assist but granddaughter  can; both home with him 24/7) Type of Home: House Home  Access: Stairs to enter Entrance Stairs-Rails: Can reach both;Right;Left Entrance Stairs-Number of Steps: 2   Home Layout: One level Home Equipment: None      Prior Function Prior Level of Function : Independent/Modified Independent             Mobility Comments: Ind ADLs Comments: Ind     Hand Dominance        Extremity/Trunk Assessment   Upper Extremity Assessment Upper Extremity Assessment: Generalized weakness    Lower Extremity Assessment Lower Extremity Assessment: Generalized weakness    Cervical / Trunk Assessment Cervical / Trunk Assessment: Kyphotic  Communication   Communication: No difficulties  Cognition Arousal/Alertness: Awake/alert Behavior During Therapy: WFL for tasks assessed/performed Overall Cognitive Status: Within Functional Limits for tasks assessed                                          General Comments General comments (skin integrity, edema, etc.): VSS on RA, begins to oxygenate better while sitting up in recliner (goes from low to high 90s); pt reports lack of confidence with movement due to prolonged sedentary state; PT educates for bracing with pillow when coughing, sneezing, etc.    Exercises     Assessment/Plan    PT Assessment Patient needs continued PT services  PT Problem List Decreased strength;Decreased activity tolerance;Decreased balance;Decreased mobility;Decreased knowledge of use of DME       PT Treatment Interventions DME instruction;Gait training;Stair training;Functional mobility training;Therapeutic activities;Therapeutic exercise;Balance training;Patient/family education    PT Goals (Current goals can be found in the Care Plan section)  Acute Rehab PT Goals Patient Stated Goal: Get out of bed PT Goal Formulation: With patient Time For Goal Achievement: 11/03/21 Potential to Achieve Goals: Good    Frequency Min 3X/week     Co-evaluation               AM-PAC PT "6 Clicks"  Mobility  Outcome Measure Help needed turning from your back to your side while in a flat bed without using bedrails?: A Little Help needed moving from lying on your back to sitting on the side of a flat bed without using bedrails?: A Little Help needed moving to and from a bed to a chair (including a wheelchair)?: A Little Help needed standing up from a chair using your arms (e.g., wheelchair or bedside chair)?: A Little Help needed to walk in hospital room?: A Little Help needed climbing 3-5 steps with a railing? : A Lot 6 Click Score: 17    End of Session Equipment Utilized During Treatment: Gait belt (High position for belt to protect incision) Activity Tolerance: Patient tolerated treatment well;Other (comment) (N/G on suction limits ambulation distance) Patient left: in chair;with call bell/phone within reach;with family/visitor present Nurse Communication: Mobility status PT Visit Diagnosis: Difficulty in walking, not elsewhere classified (R26.2);Muscle weakness (generalized) (M62.81)    Time: 2683-4196 PT Time Calculation (min) (ACUTE ONLY): 23 min   Charges:   PT Evaluation $PT Eval Low Complexity: 1 Low PT Treatments $Therapeutic Activity: 8-22 mins        Hall Busing, SPT Acute Rehabilitation Office #: [phone]   Hall Busing 10/20/2021, 1:35 PM

## 2021-10-20 NOTE — Progress Notes (Signed)
Mobility Specialist Progress Note:   10/20/21 1410  Mobility  Activity Transferred from chair to bed  Level of Assistance Contact guard assist, steadying assist  Assistive Device Front wheel walker  Distance Ambulated (ft) 10 ft  Activity Response Tolerated well  $Mobility charge 1 Mobility   Pt requesting to transfer back to bed. Required minG throughout transfer with RW. Pt back in bed with all needs met.   Nelta Numbers Acute Rehab Secure Chat or Office Phone: [phone]

## 2021-10-21 LAB — CBC
HCT: 32.5 % — ABNORMAL LOW (ref 39.0–52.0)
Hemoglobin: 10.3 g/dL — ABNORMAL LOW (ref 13.0–17.0)
MCH: 23.1 pg — ABNORMAL LOW (ref 26.0–34.0)
MCHC: 31.7 g/dL (ref 30.0–36.0)
MCV: 72.9 fL — ABNORMAL LOW (ref 80.0–100.0)
Platelets: 264 10*3/uL (ref 150–400)
RBC: 4.46 MIL/uL (ref 4.22–5.81)
RDW: 18.9 % — ABNORMAL HIGH (ref 11.5–15.5)
WBC: 12.3 10*3/uL — ABNORMAL HIGH (ref 4.0–10.5)
nRBC: 0 % (ref 0.0–0.2)

## 2021-10-21 LAB — COMPREHENSIVE METABOLIC PANEL
ALT: 28 U/L (ref 0–44)
AST: 25 U/L (ref 15–41)
Albumin: 2.8 g/dL — ABNORMAL LOW (ref 3.5–5.0)
Alkaline Phosphatase: 101 U/L (ref 38–126)
Anion gap: 9 (ref 5–15)
BUN: 9 mg/dL (ref 8–23)
CO2: 27 mmol/L (ref 22–32)
Calcium: 8.5 mg/dL — ABNORMAL LOW (ref 8.9–10.3)
Chloride: 105 mmol/L (ref 98–111)
Creatinine, Ser: 0.91 mg/dL (ref 0.61–1.24)
GFR, Estimated: >60 mL/min (ref >60)
Glucose, Bld: 134 mg/dL — ABNORMAL HIGH (ref 70–99)
Potassium: 3.1 mmol/L — ABNORMAL LOW (ref 3.5–5.1)
Sodium: 141 mmol/L (ref 135–145)
Total Bilirubin: 1.3 mg/dL — ABNORMAL HIGH (ref 0.3–1.2)
Total Protein: 6.1 g/dL — ABNORMAL LOW (ref 6.5–8.1)

## 2021-10-21 LAB — PHOSPHORUS: Phosphorus: 2.3 mg/dL — ABNORMAL LOW (ref 2.5–4.6)

## 2021-10-21 LAB — MAGNESIUM: Magnesium: 1.6 mg/dL — ABNORMAL LOW (ref 1.7–2.4)

## 2021-10-21 LAB — PROTIME-INR
INR: 1.2 (ref 0.8–1.2)
Prothrombin Time: 15.5 seconds — ABNORMAL HIGH (ref 11.4–15.2)

## 2021-10-21 MED ORDER — CARVEDILOL 12.5 MG PO TABS
12.5000 mg | ORAL_TABLET | Freq: Two times a day (BID) | ORAL | Status: DC
  Administered 2021-10-21 – 2021-10-24 (×7): 12.5 mg via ORAL
  Filled 2021-10-21 (×7): qty 1

## 2021-10-21 MED ORDER — POTASSIUM PHOSPHATES 15 MMOLE/5ML IV SOLN
30.0000 mmol | Freq: Once | INTRAVENOUS | Status: AC
  Administered 2021-10-21: 30 mmol via INTRAVENOUS
  Filled 2021-10-21: qty 10

## 2021-10-21 MED ORDER — POTASSIUM CHLORIDE 10 MEQ/100ML IV SOLN
10.0000 meq | INTRAVENOUS | Status: AC
  Administered 2021-10-21 (×4): 10 meq via INTRAVENOUS
  Filled 2021-10-21 (×4): qty 100

## 2021-10-21 MED ORDER — MAGNESIUM SULFATE 2 GM/50ML IV SOLN
2.0000 g | Freq: Once | INTRAVENOUS | Status: AC
  Administered 2021-10-21: 2 g via INTRAVENOUS
  Filled 2021-10-21: qty 50

## 2021-10-21 NOTE — TOC Initial Note (Signed)
Transition of Care [facility]) - Initial/Assessment Note    Patient Details  Name: Aaron Moss MRN: [id number] Date of Birth: June 15, 1945  Transition of Care [facility]) CM/SW Contact:    Marilu Favre, RN Phone Number: 10/21/2021, 2:47 PM  Clinical Narrative:                  Spoke to patient and wife at bedside. Discussed PT recommendation for home health PT and walker. Both in agreement. Wife stated she talked to someone at [facility] and [facility] services will be provided through [facility].   Patient will need walker . Entered walker order but will need to call [place] closer to discharge for delivery. Patient and wife aware.  Spoke to Dent with [facility], she has not received a referral from [facility] , but accepted referral for HHPT from NCM. NCM entered orders for MD to sign  Expected Discharge Plan: [place] Barriers to Discharge: Continued Medical Work up   Patient Goals and CMS Choice Patient states their goals for this hospitalization and ongoing recovery are:: to return to home CMS Medicare.gov Compare Post Acute Care list provided to:: Patient Choice offered to / list presented to : Patient, Spouse  Expected Discharge Plan and Services Expected Discharge Plan: [place]   Discharge Planning Services: CM Consult Post Acute Care Choice: [place] arrangements for the past 2 months: Single Family Home                 DME Arranged: Walker rolling DME Agency: [employer]       HH Arranged: PT HH Agency: [place] Date [place]: 10/21/21 Time HH Agency Contacted: 30 Representative spoke with at [place]: [place] Arrangements/Services Living arrangements for the past 2 months: [place] with:: Spouse Patient language and need for interpreter reviewed:: Yes Do you feel safe going back to the place where you live?: Yes      Need for Family Participation in  Patient Care: Yes (Comment) Care giver support system in place?: Yes (comment)   Criminal Activity/Legal Involvement Pertinent to Current Situation/Hospitalization: No - Comment as needed  Activities of Daily Living Home Assistive Devices/Equipment: Blood pressure cuff ADL Screening (condition at time of admission) Patient's cognitive ability adequate to safely complete daily activities?: Yes Is the patient deaf or have difficulty hearing?: No Does the patient have difficulty seeing, even when wearing glasses/contacts?: No Does the patient have difficulty concentrating, remembering, or making decisions?: No Patient able to express need for assistance with ADLs?: Yes Does the patient have difficulty dressing or bathing?: No Independently performs ADLs?: Yes (appropriate for developmental age) Does the patient have difficulty walking or climbing stairs?: No Weakness of Legs: None Weakness of Arms/Hands: None  Permission Sought/Granted   Permission granted to share information with : Yes, Verbal Permission Granted  Share Information with NAME: [name]  Permission granted to share info w AGENCY: [facility]        Emotional Assessment Appearance:: Appears stated age Attitude/Demeanor/Rapport: Engaged Affect (typically observed): Accepting Orientation: : Oriented to Self, Oriented to Place, Oriented to  Time, Oriented to Situation Alcohol / Substance Use: Not Applicable Psych Involvement: No (comment)  Admission diagnosis:  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] Patient Active Problem List   Diagnosis Date Noted   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   Genetic testing 04/28/2021  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[place]) 04/25/2021   TIA (transient ischemic attack) 04/18/2021   Left carotid artery stenosis 04/18/2021   Fall at home, initial encounter 04/18/2021    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   Loss of weight 03/08/2021   Iron deficiency anemia 02/16/2021   Epigastric pain 02/16/2021   Abnormal LFTs 02/16/2021   Leg swelling 11/16/2020   Anemia 11/16/2020   Hypokalemia 07/07/2020   Vitamin D deficiency 07/07/2020   Dog bite 06/26/2020   Change in bowel habits 11/01/2019   Chronic right-sided low back pain without sciatica 07/02/2019   Skin lesion 12/24/2018   Cough 05/03/2018   Hyperglycemia 05/03/2018   Left ankle sprain 11/01/2017   Left knee pain 12/05/2016   Allergic rhinitis 07/29/2015   CAP (community acquired pneumonia) 04/01/2015   Abnormal urine findings 04/01/2015   UTI (urinary tract infection) 08/21/2013   Malignant neoplasm of prostate ([place]) 07/02/2013   Bilateral knee pain 08/29/2011   Bladder neck obstruction 08/29/2011   Erectile dysfunction 06/20/2011   Encounter for well adult exam with abnormal findings 12/14/2010   POSTHERPETIC NEURALGIA 06/08/2009   HERPES ZOSTER 05/11/2009   Cyst and pseudocyst of pancreas 05/04/2009   HIP PAIN, RIGHT 07/06/2008   BACK PAIN 07/06/2008   Hyperlipidemia 05/15/2007   Hypertension, uncontrolled 10/12/2006   History of colon cancer 10/12/2006   PCP:  Biagio Borg, MD Pharmacy:   [facility] ([place]), [place] - [address] [place] [address] Phone: [phone] Fax: [phone]  [id number] - [place], [place] - [address] [place] [address] Phone: [phone] Fax: [phone]     Social Determinants of Health (SDOH) Interventions    Readmission Risk Interventions     No data to display

## 2021-10-21 NOTE — Progress Notes (Signed)
Mobility Specialist Progress Note:   10/21/21 1640  Mobility  Activity Ambulated with assistance in hallway  Level of Assistance Contact guard assist, steadying assist  Assistive Device Front wheel walker  Distance Ambulated (ft) 150 ft  Activity Response Tolerated well  $Mobility charge 1 Mobility   Pt agreeable to mobility session. Required CGA throughout for safety. Pt left sitting EOB with all needs met.   Nelta Numbers Acute Rehab Secure Chat or Office Phone: [phone]

## 2021-10-21 NOTE — Progress Notes (Signed)
2 Days Post-Op   Subjective/Chief Complaint: No n/v.  Was OOB.    Objective: Vital signs in last 24 hours: Temp:  [98.3 F (36.8 C)-99.8 F (37.7 C)] 98.9 F (37.2 C) (08/04 0922) Pulse Rate:  [77-91] 87 (08/04 0922) Resp:  [15-20] 18 (08/04 1141) BP: (159-172)/(61-71) 159/61 (08/04 0922) SpO2:  [95 %-100 %] 99 % (08/04 1141) FiO2 (%):  [75 %] 75 % (08/04 0009) Last BM Date : 10/18/21  Intake/Output from previous day: 08/03 0701 - 08/04 0700 In: 1892.3 [I.V.:1692.3; IV Piggyback:200] Out: 4860 [Urine:4550; Emesis/NG output:310] Intake/Output this shift: No intake/output data recorded.   General appearance: alert and mild distress Resp: breathing comfortably GI: soft, approp tender, non distended. Incision c/d/I. Dressing removed.  Ngt output bilious.  Extremities: extremities normal, atraumatic, no cyanosis or edema   Lab Results:  Recent Labs    10/20/21 0144 10/21/21 0149  WBC 18.2* 12.3*  HGB 10.3* 10.3*  HCT 33.4* 32.5*  PLT 273 264   BMET Recent Labs    10/20/21 0144 10/21/21 0149  NA 140 141  K 3.6 3.1*  CL 109 105  CO2 24 27  GLUCOSE 143* 134*  BUN 14 9  CREATININE 1.15 0.91  CALCIUM 8.5* 8.5*   PT/INR Recent Labs    10/20/21 0144 10/21/21 0149  LABPROT 15.1 15.5*  INR 1.2 1.2   ABG Recent Labs    10/19/21 0904 10/19/21 1034  PHART 7.386 7.365  HCO3 29.0* 27.1    Studies/Results: No results found.  Anti-infectives: Anti-infectives (From admission, onward)    Start     Dose/Rate Route Frequency Ordered Stop   10/19/21 1530  ceFAZolin (ANCEF) IVPB 2g/100 mL premix        2 g 200 mL/hr over 30 Minutes Intravenous Every 8 hours 10/19/21 1438 10/19/21 1744   10/19/21 0630  ceFAZolin (ANCEF) IVPB 2g/100 mL premix        2 g 200 mL/hr over 30 Minutes Intravenous On call to O.R. 10/19/21 5956 10/19/21 0913       Assessment/Plan: s/p Procedure(s) with comments: LAPAROSCOPY DIAGNOSTIC, ATTEMPTED WHIPPLE PROCEDURE  WITH  gastrojejunostomy and CHOLECYSTECTOMY     Unresectable disease. Await return of bowel function. Foley and ngt out today.  Restart coreg today and d/c metoprolol. Continue clonidine patch.   Restart some home meds tomorrow.    PT consult rec HH PT.       LOS: 2 days    [name] 10/21/2021

## 2021-10-21 NOTE — Care Management Important Message (Signed)
Important Message  Patient Details  Name: Aaron Moss MRN: [id number] Date of Birth: 1945/06/22   Medicare Important Message Given:  Yes     Hannah Beat 10/21/2021, 12:06 PM

## 2021-10-21 NOTE — Progress Notes (Signed)
   10/21/21 1445  Clinical Encounter Type  Visited With Family  Visit Type Initial  Referral From Family;Nurse  Consult/Referral To Chaplain ([employer])  Recommendations Facilitate Notary & Volunteers   Psychologist, occupational for [place] and Living Will

## 2021-10-21 NOTE — Progress Notes (Signed)
Physical Therapy Treatment Patient Details Name: Aaron Moss MRN: [id number] DOB: 24-Jan-1946 Today's Date: 10/21/2021   History of Present Illness 76 y.o. male presents to [facility] on 10/19/2021 for an elective surgery procedure for diagnosis of duodenal cancer: diagnostic laparoscopy, exploratory laparotomy, portal exploration, cholecystectomy, loop gastrojejunostomy. PMH: GERD, HLD, HTN, Cx history, anemia.    PT Comments    Pt tolerates therapy well today, ambulating household distances with an AD. Continued therapy will assist the pt in building strength, balance and activity tolerance for progression of functional independence. Stair training needs to be addressed in future sessions, as pt has 2 stairs to enter his home. Current plan remains appropriate.    Recommendations for follow up therapy are one component of a multi-disciplinary discharge planning process, led by the attending physician.  Recommendations may be updated based on patient status, additional functional criteria and insurance authorization.  Follow Up Recommendations  Home health PT     Assistance Recommended at Discharge Intermittent Supervision/Assistance  Patient can return home with the following A little help with walking and/or transfers;A little help with bathing/dressing/bathroom;Assistance with cooking/housework;Assist for transportation;Help with stairs or ramp for entrance   Equipment Recommendations  Rolling walker (2 wheels)    Recommendations for Other Services       Precautions / Restrictions Precautions Precautions: Fall (PCA Pump) Restrictions Weight Bearing Restrictions: No     Mobility  Bed Mobility Overal bed mobility: Needs Assistance Bed Mobility: Supine to Sit     Supine to sit: Supervision          Transfers Overall transfer level: Needs assistance Equipment used: Rolling walker (2 wheels) Transfers: Sit to/from Stand Sit to Stand: Min guard                 Ambulation/Gait Ambulation/Gait assistance: Min guard Gait Distance (Feet): 100 Feet Assistive device: Rolling walker (2 wheels) Gait Pattern/deviations: Step-through pattern, Decreased stride length, Trunk flexed Gait velocity: reduced Gait velocity interpretation: <1.31 ft/sec, indicative of household Architectural technologist [place] (Stroke Patients Only)       Balance Overall balance assessment: Needs assistance Sitting-balance support: No upper extremity supported, Feet supported Sitting balance-Leahy Scale: Fair     Standing balance support: Bilateral upper extremity supported, During functional activity, Reliant on assistive device for balance Standing balance-Leahy Scale: Poor                              Cognition Arousal/Alertness: Awake/alert Behavior During Therapy: WFL for tasks assessed/performed Overall Cognitive Status: Within Functional Limits for tasks assessed                                          Exercises      General Comments General comments (skin integrity, edema, etc.): VSS on RA      Pertinent Vitals/Pain Pain Assessment Pain Assessment: 0-10 Pain Score: 3  Pain Location: Stomach / Incision site Pain Descriptors / Indicators: Aching Pain Intervention(s): Monitored during session    Home Living                          Prior Function  PT Goals (current goals can now be found in the care plan section) Acute Rehab PT Goals Patient Stated Goal: Get out of bed PT Goal Formulation: With patient Time For Goal Achievement: 11/03/21 Potential to Achieve Goals: Good Progress towards PT goals: Progressing toward goals    Frequency    Min 3X/week      PT Plan Current plan remains appropriate    Co-evaluation              AM-PAC PT "6 Clicks" Mobility   Outcome Measure  Help needed turning from your back to your  side while in a flat bed without using bedrails?: A Little Help needed moving from lying on your back to sitting on the side of a flat bed without using bedrails?: A Little Help needed moving to and from a bed to a chair (including a wheelchair)?: A Little Help needed standing up from a chair using your arms (e.g., wheelchair or bedside chair)?: A Little Help needed to walk in hospital room?: A Little Help needed climbing 3-5 steps with a railing? : A Lot 6 Click Score: 17    End of Session   Activity Tolerance: Patient tolerated treatment well Patient left: with call bell/phone within reach;with family/visitor present;in bed Nurse Communication: Mobility status PT Visit Diagnosis: Difficulty in walking, not elsewhere classified (R26.2);Muscle weakness (generalized) (M62.81)     Time: 6712-4580 PT Time Calculation (min) (ACUTE ONLY): 19 min  Charges:  $Gait Training: 8-22 mins                     Hall Busing, SPT Acute Rehabilitation Office #: [phone]    Hall Busing 10/21/2021, 5:01 PM

## 2021-10-22 LAB — MAGNESIUM: Magnesium: 2 mg/dL (ref 1.7–2.4)

## 2021-10-22 LAB — PROTIME-INR
INR: 1.2 (ref 0.8–1.2)
Prothrombin Time: 15.2 seconds (ref 11.4–15.2)

## 2021-10-22 LAB — COMPREHENSIVE METABOLIC PANEL
ALT: 22 U/L (ref 0–44)
AST: 19 U/L (ref 15–41)
Albumin: 2.5 g/dL — ABNORMAL LOW (ref 3.5–5.0)
Alkaline Phosphatase: 84 U/L (ref 38–126)
Anion gap: 6 (ref 5–15)
BUN: 5 mg/dL — ABNORMAL LOW (ref 8–23)
CO2: 25 mmol/L (ref 22–32)
Calcium: 8.2 mg/dL — ABNORMAL LOW (ref 8.9–10.3)
Chloride: 106 mmol/L (ref 98–111)
Creatinine, Ser: 0.84 mg/dL (ref 0.61–1.24)
GFR, Estimated: >60 mL/min (ref >60)
Glucose, Bld: 108 mg/dL — ABNORMAL HIGH (ref 70–99)
Potassium: 3.6 mmol/L (ref 3.5–5.1)
Sodium: 137 mmol/L (ref 135–145)
Total Bilirubin: 1.2 mg/dL (ref 0.3–1.2)
Total Protein: 5.7 g/dL — ABNORMAL LOW (ref 6.5–8.1)

## 2021-10-22 LAB — CBC
HCT: 30.8 % — ABNORMAL LOW (ref 39.0–52.0)
Hemoglobin: 9.8 g/dL — ABNORMAL LOW (ref 13.0–17.0)
MCH: 23.4 pg — ABNORMAL LOW (ref 26.0–34.0)
MCHC: 31.8 g/dL (ref 30.0–36.0)
MCV: 73.5 fL — ABNORMAL LOW (ref 80.0–100.0)
Platelets: 227 10*3/uL (ref 150–400)
RBC: 4.19 MIL/uL — ABNORMAL LOW (ref 4.22–5.81)
RDW: 18.6 % — ABNORMAL HIGH (ref 11.5–15.5)
WBC: 8.7 10*3/uL (ref 4.0–10.5)
nRBC: 0 % (ref 0.0–0.2)

## 2021-10-22 LAB — PHOSPHORUS: Phosphorus: 2.7 mg/dL (ref 2.5–4.6)

## 2021-10-22 MED ORDER — OXYCODONE HCL 5 MG PO TABS: 5.0000 mg | ORAL_TABLET | ORAL | Status: DC | PRN

## 2021-10-22 MED ORDER — BENAZEPRIL HCL 5 MG PO TABS
40.0000 mg | ORAL_TABLET | Freq: Every day | ORAL | Status: DC
  Administered 2021-10-22 – 2021-10-24 (×3): 40 mg via ORAL
  Filled 2021-10-22 (×3): qty 8

## 2021-10-22 MED ORDER — HYDROMORPHONE HCL 1 MG/ML IJ SOLN: 0.5000 mg | INTRAMUSCULAR | Status: DC | PRN

## 2021-10-22 MED ORDER — AMLODIPINE BESYLATE 10 MG PO TABS
10.0000 mg | ORAL_TABLET | Freq: Every day | ORAL | Status: DC
  Administered 2021-10-22 – 2021-10-24 (×3): 10 mg via ORAL
  Filled 2021-10-22 (×3): qty 1

## 2021-10-22 NOTE — Progress Notes (Signed)
Pca dilaudid discontinued, wasted 34m witnessed by JDagoberto Ligas RN

## 2021-10-22 NOTE — Progress Notes (Signed)
3 Days Post-Op   Subjective/Chief Complaint: Tol clears no n/v, having flatus, oob some, pain controlled   Objective: Vital signs in last 24 hours: Temp:  [98 F (36.7 C)-99.8 F (37.7 C)] 98.4 F (36.9 C) (08/05 0733) Pulse Rate:  [57-87] 80 (08/05 0733) Resp:  [16-19] 19 (08/05 0804) BP: (153-171)/(61-88) 168/79 (08/05 0733) SpO2:  [96 %-100 %] 96 % (08/05 0804) FiO2 (%):  [75 %] 75 % (08/05 0804) Last BM Date : 10/18/21  Intake/Output from previous day: 08/04 0701 - 08/05 0700 In: 1664 [I.V.:884.1; IV Piggyback:779.9] Out: 700 [Urine:700] Intake/Output this shift: No intake/output data recorded.  General nad Pulm effort normal Ab soft approp tender incision clean  Lab Results:  Recent Labs    10/21/21 0149 10/22/21 0111  WBC 12.3* 8.7  HGB 10.3* 9.8*  HCT 32.5* 30.8*  PLT 264 227   BMET Recent Labs    10/21/21 0149 10/22/21 0111  NA 141 137  K 3.1* 3.6  CL 105 106  CO2 27 25  GLUCOSE 134* 108*  BUN 9 5*  CREATININE 0.91 0.84  CALCIUM 8.5* 8.2*   PT/INR Recent Labs    10/21/21 0149 10/22/21 0111  LABPROT 15.5* 15.2  INR 1.2 1.2   ABG Recent Labs    10/19/21 0904 10/19/21 1034  PHART 7.386 7.365  HCO3 29.0* 27.1    Studies/Results: No results found.  Anti-infectives: Anti-infectives (From admission, onward)    Start     Dose/Rate Route Frequency Ordered Stop   10/19/21 1530  ceFAZolin (ANCEF) IVPB 2g/100 mL premix        2 g 200 mL/hr over 30 Minutes Intravenous Every 8 hours 10/19/21 1438 10/19/21 1744   10/19/21 0630  ceFAZolin (ANCEF) IVPB 2g/100 mL premix        2 g 200 mL/hr over 30 Minutes Intravenous On call to O.R. 10/19/21 8144 10/19/21 0913       Assessment/Plan: POD 3 elap/palliative gj/cholecystectomy for duodenal mass -fulls today -po pain meds with iv backup -home antihypertensives -oob, pulm toilet -lovenox     Rolm Bookbinder 10/22/2021

## 2021-10-23 LAB — CBC
HCT: 30.5 % — ABNORMAL LOW (ref 39.0–52.0)
Hemoglobin: 9.6 g/dL — ABNORMAL LOW (ref 13.0–17.0)
MCH: 23.3 pg — ABNORMAL LOW (ref 26.0–34.0)
MCHC: 31.5 g/dL (ref 30.0–36.0)
MCV: 74 fL — ABNORMAL LOW (ref 80.0–100.0)
Platelets: 253 10*3/uL (ref 150–400)
RBC: 4.12 MIL/uL — ABNORMAL LOW (ref 4.22–5.81)
RDW: 18.8 % — ABNORMAL HIGH (ref 11.5–15.5)
WBC: 8.5 10*3/uL (ref 4.0–10.5)
nRBC: 0 % (ref 0.0–0.2)

## 2021-10-23 LAB — COMPREHENSIVE METABOLIC PANEL
ALT: 35 U/L (ref 0–44)
AST: 33 U/L (ref 15–41)
Albumin: 2.5 g/dL — ABNORMAL LOW (ref 3.5–5.0)
Alkaline Phosphatase: 81 U/L (ref 38–126)
Anion gap: 7 (ref 5–15)
BUN: 7 mg/dL — ABNORMAL LOW (ref 8–23)
CO2: 23 mmol/L (ref 22–32)
Calcium: 8.4 mg/dL — ABNORMAL LOW (ref 8.9–10.3)
Chloride: 107 mmol/L (ref 98–111)
Creatinine, Ser: 1.08 mg/dL (ref 0.61–1.24)
GFR, Estimated: >60 mL/min (ref >60)
Glucose, Bld: 87 mg/dL (ref 70–99)
Potassium: 3.5 mmol/L (ref 3.5–5.1)
Sodium: 137 mmol/L (ref 135–145)
Total Bilirubin: 1.2 mg/dL (ref 0.3–1.2)
Total Protein: 5.8 g/dL — ABNORMAL LOW (ref 6.5–8.1)

## 2021-10-23 LAB — MAGNESIUM: Magnesium: 2 mg/dL (ref 1.7–2.4)

## 2021-10-23 MED ORDER — PANTOPRAZOLE SODIUM 40 MG PO TBEC
40.0000 mg | DELAYED_RELEASE_TABLET | Freq: Every day | ORAL | Status: DC
Start: 2021-10-23 — End: 2021-10-24
  Administered 2021-10-23: 40 mg via ORAL
  Filled 2021-10-23: qty 1

## 2021-10-23 NOTE — Progress Notes (Signed)
4 Days Post-Op   Subjective/Chief Complaint: Doing great, having bms and flatus, no n/v, tol fulls, oob   Objective: Vital signs in last 24 hours: Temp:  [98.2 F (36.8 C)-99.4 F (37.4 C)] 98.2 F (36.8 C) (08/06 0736) Pulse Rate:  [68-78] 78 (08/06 0736) Resp:  [16-19] 16 (08/06 0736) BP: (160-170)/(60-77) 160/60 (08/06 0736) SpO2:  [96 %-100 %] 96 % (08/06 0736) Last BM Date : 10/18/21  Intake/Output from previous day: 08/05 0701 - 08/06 0700 In: 220 [P.O.:220] Out: -  Intake/Output this shift: No intake/output data recorded.  General nad Pulm effort normal Ab soft approp tender incision clean  Lab Results:  Recent Labs    10/22/21 0111 10/23/21 0131  WBC 8.7 8.5  HGB 9.8* 9.6*  HCT 30.8* 30.5*  PLT 227 253   BMET Recent Labs    10/22/21 0111 10/23/21 0131  NA 137 137  K 3.6 3.5  CL 106 107  CO2 25 23  GLUCOSE 108* 87  BUN 5* 7*  CREATININE 0.84 1.08  CALCIUM 8.2* 8.4*   PT/INR Recent Labs    10/21/21 0149 10/22/21 0111  LABPROT 15.5* 15.2  INR 1.2 1.2   ABG No results for input(s): "PHART", "HCO3" in the last 72 hours.  Invalid input(s): "PCO2", "PO2"  Studies/Results: No results found.  Anti-infectives: Anti-infectives (From admission, onward)    Start     Dose/Rate Route Frequency Ordered Stop   10/19/21 1530  ceFAZolin (ANCEF) IVPB 2g/100 mL premix        2 g 200 mL/hr over 30 Minutes Intravenous Every 8 hours 10/19/21 1438 10/19/21 1744   10/19/21 0630  ceFAZolin (ANCEF) IVPB 2g/100 mL premix        2 g 200 mL/hr over 30 Minutes Intravenous On call to O.R. 10/19/21 5597 10/19/21 0913       Assessment/Plan: POD 4 elap/palliative gj/cholecystectomy for duodenal mass -soft diet -po pain meds with iv backup -home antihypertensives -oob, pulm toilet -lovenox -may be able to go home tomorrow   Rolm Bookbinder 10/23/2021

## 2021-10-23 NOTE — Progress Notes (Signed)
Mobility Specialist Progress Note   10/23/21 1641  Mobility  Activity Ambulated independently in hallway  Level of Assistance Modified independent, requires aide device or extra time  Assistive Device Front wheel walker  Distance Ambulated (ft) 550 ft  Activity Response Tolerated well  $Mobility charge 1 Mobility   Received pt in bed having no complaints and agreeable to mobility. Pt was asymptomatic throughout ambulation and returned to room w/o fault. Left at EOB w/ call bell in reach and all needs met.  Holland Falling Mobility Specialist MS [facility] #:  [phone] Acute Rehab Office:  [phone]

## 2021-10-24 ENCOUNTER — Telehealth: Payer: Self-pay

## 2021-10-24 LAB — CBC
HCT: 28.6 % — ABNORMAL LOW (ref 39.0–52.0)
Hemoglobin: 9 g/dL — ABNORMAL LOW (ref 13.0–17.0)
MCH: 23 pg — ABNORMAL LOW (ref 26.0–34.0)
MCHC: 31.5 g/dL (ref 30.0–36.0)
MCV: 73 fL — ABNORMAL LOW (ref 80.0–100.0)
Platelets: 262 10*3/uL (ref 150–400)
RBC: 3.92 MIL/uL — ABNORMAL LOW (ref 4.22–5.81)
RDW: 18.7 % — ABNORMAL HIGH (ref 11.5–15.5)
WBC: 7.8 10*3/uL (ref 4.0–10.5)
nRBC: 0 % (ref 0.0–0.2)

## 2021-10-24 LAB — COMPREHENSIVE METABOLIC PANEL
ALT: 34 U/L (ref 0–44)
AST: 25 U/L (ref 15–41)
Albumin: 2.5 g/dL — ABNORMAL LOW (ref 3.5–5.0)
Alkaline Phosphatase: 72 U/L (ref 38–126)
Anion gap: 8 (ref 5–15)
BUN: 13 mg/dL (ref 8–23)
CO2: 24 mmol/L (ref 22–32)
Calcium: 8.4 mg/dL — ABNORMAL LOW (ref 8.9–10.3)
Chloride: 110 mmol/L (ref 98–111)
Creatinine, Ser: 1.07 mg/dL (ref 0.61–1.24)
GFR, Estimated: >60 mL/min (ref >60)
Glucose, Bld: 81 mg/dL (ref 70–99)
Potassium: 3.2 mmol/L — ABNORMAL LOW (ref 3.5–5.1)
Sodium: 142 mmol/L (ref 135–145)
Total Bilirubin: 1.2 mg/dL (ref 0.3–1.2)
Total Protein: 5.5 g/dL — ABNORMAL LOW (ref 6.5–8.1)

## 2021-10-24 LAB — MAGNESIUM: Magnesium: 1.9 mg/dL (ref 1.7–2.4)

## 2021-10-24 MED ORDER — ATORVASTATIN CALCIUM 40 MG PO TABS
40.0000 mg | ORAL_TABLET | Freq: Every day | ORAL | Status: DC
  Administered 2021-10-24: 40 mg via ORAL
  Filled 2021-10-24: qty 1

## 2021-10-24 MED ORDER — POTASSIUM CHLORIDE ER 10 MEQ PO TBCR
30.0000 meq | EXTENDED_RELEASE_TABLET | Freq: Two times a day (BID) | ORAL | Status: DC
  Administered 2021-10-24: 30 meq via ORAL
  Filled 2021-10-24 (×2): qty 3

## 2021-10-24 MED ORDER — OXYCODONE HCL 5 MG PO TABS: 5.0000 mg | ORAL_TABLET | ORAL | 0 refills | Status: DC | PRN

## 2021-10-24 MED ORDER — PROCHLORPERAZINE MALEATE 10 MG PO TABS: 10.0000 mg | ORAL_TABLET | Freq: Four times a day (QID) | ORAL | 0 refills | Status: DC | PRN

## 2021-10-24 MED ORDER — ASPIRIN 81 MG PO TBEC
81.0000 mg | DELAYED_RELEASE_TABLET | Freq: Every day | ORAL | Status: DC
  Administered 2021-10-24: 81 mg via ORAL
  Filled 2021-10-24: qty 1

## 2021-10-24 MED ORDER — POTASSIUM CHLORIDE ER 20 MEQ PO TBCR: 20.0000 meq | EXTENDED_RELEASE_TABLET | Freq: Three times a day (TID) | ORAL | 0 refills | Status: DC

## 2021-10-24 MED ORDER — POLYSACCHARIDE IRON COMPLEX 150 MG PO CAPS
150.0000 mg | ORAL_CAPSULE | Freq: Two times a day (BID) | ORAL | Status: DC
  Administered 2021-10-24: 150 mg via ORAL
  Filled 2021-10-24: qty 1

## 2021-10-24 NOTE — Discharge Summary (Addendum)
Physician Discharge Summary  Patient ID: Aaron Moss MRN: [id number] DOB/AGE: 1945-09-01 76 y.o.  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  Diagnostic laparoscopy was negative for metastatic disease.  Unfortunately, on abdominal exploration he was found to have tumor involvement of the common hepatic artery.  The gastroduodenal artery was unable to be dissected out due to tumor involvement.  The Whipple was aborted and a gastrojejunostomy was created due to impending gastric outlet obstruction.  A feeding tube was not placed as he had not had any weight loss.  The patient was admitted to the floor.  NG tube and Foley were removed on postop day 2 and he was started on clear liquids.  These were progressively advanced.  By postop day 4 he was able to tolerate regular diet and was able to be discharged home.  He was also able to ambulate and tolerated oral pain medication.  Oncology was notified about the inability to resect the tumor.  He was discharged home in stable condition on POD 5.  Throughout his hospital stay he had issues with hypokalemia which was a baseline issue at home.  His potassium dose was increased compared to his home dose prior to discharge.   Consults:  PT/OT  Significant Diagnostic Studies:  K 3.2, Cr 1.07, WBCs 7.8k, HCT 28.6  Treatments: surgery: gastrojejunostomy  Discharge Exam: Blood pressure 139/60, pulse 75, temperature 99 F (37.2 C), temperature source Oral, resp. rate 16, height '5\' 6"'$   (1.676 m), weight 57.6 kg, SpO2 100 %. General appearance: alert, cooperative, and no distress Resp: breathing normally Cardio: regular rate and rhythm GI: soft, non distended, approp tender at incision.  Inc c/d/i, no erythema or drainage.   Extremities: extremities normal, atraumatic, no cyanosis or edema  Disposition: Discharge disposition: 01-Home or Self Care       Discharge Instructions     Call MD for:  difficulty breathing, headache or visual disturbances   Complete by: As directed    Call MD for:  hives   Complete by: As directed    Call MD for:  persistant nausea and vomiting   Complete by: As directed    Call MD for:  redness, tenderness, or signs of infection (pain, swelling, redness, odor or green/yellow discharge around incision site)   Complete by: As directed    Call MD for:  severe uncontrolled pain   Complete by: As directed    Call MD for:  temperature >100.4   Complete by: As directed    Diet - low sodium heart healthy   Complete by: As directed    Increase activity slowly   Complete by: As directed       Allergies as of 10/24/2021       Reactions   Lyrica [pregabalin] Other (See Comments)   "Just made me feel bad"        Medication List     STOP taking these medications    clopidogrel 75 MG tablet Commonly known as: PLAVIX       TAKE these medications    amLODipine 10 MG  tablet Commonly known as: NORVASC Take 1 tablet by mouth once daily   aspirin EC 81 MG tablet Take 1 tablet (81 mg total) by mouth daily. Swallow whole.   atorvastatin 40 MG tablet Commonly known as: LIPITOR Take 1 tablet by mouth once daily   benazepril 40 MG tablet Commonly known as: LOTENSIN Take 1 tablet by mouth once daily   carvedilol 12.5 MG tablet Commonly known as: COREG Take 12.5 mg by mouth 2 (two) times daily.   iron polysaccharides 150 MG capsule Commonly known as: Nu-Iron Take 1 capsule (150 mg total) by mouth 2 (two) times daily.    oxyCODONE 5 MG immediate release tablet Commonly known as: Oxy IR/ROXICODONE Take 1 tablet (5 mg total) by mouth every 4 (four) hours as needed for moderate pain.   pantoprazole 40 MG tablet Commonly known as: PROTONIX Take 1 tablet (40 mg total) by mouth daily.   potassium chloride 10 MEQ tablet Commonly known as: KLOR-CON Take 1 tablet by mouth once daily What changed: Another medication with the same name was added. Make sure you understand how and when to take each.   Potassium Chloride ER 20 MEQ Tbcr Take 20 mEq by mouth 3 (three) times daily. What changed: You were already taking a medication with the same name, and this prescription was added. Make sure you understand how and when to take each.   prochlorperazine 10 MG tablet Commonly known as: COMPAZINE Take 1 tablet (10 mg total) by mouth every 6 (six) hours as needed for nausea or vomiting (Use for nausea and / or vomiting unresolved with ondansetron (Zofran).).   traMADol 50 MG tablet Commonly known as: ULTRAM TAKE 1 TABLET BY MOUTH EVERY 6 HOURS AS NEEDED FOR PAIN   Travoprost (BAK Free) 0.004 % Soln ophthalmic solution Commonly known as: TRAVATAN Place 1 drop into both eyes at bedtime.   VITAMIN D PO Take 2 tablets by mouth daily.        Follow-up Information     [facility], [place] Follow up.   Contact information: [place] [address] [phone]         [name], MD Follow up in 3 week(s).   Specialty: General Surgery Contact information: [address] [place] [address] [phone]         Surgery, [place] Follow up in 1 week(s).   Specialty: General Surgery Why: staple removal Contact information: [place] [address] [phone]                 Signed: [name] 10/25/2021, 8:01 AM

## 2021-10-24 NOTE — Discharge Instructions (Signed)
[facility]      [facility] ?[phone] ? ?ABDOMINAL SURGERY: POST OP INSTRUCTIONS ? ?Always review your discharge instruction sheet given to you by the facility where your surgery was performed. ? ?IF YOU HAVE DISABILITY OR FAMILY LEAVE FORMS, YOU MUST BRING THEM TO THE OFFICE FOR PROCESSING.  PLEASE DO NOT GIVE THEM TO YOUR DOCTOR. ? ?A prescription for pain medication may be given to you upon discharge.  Take your pain medication as prescribed, if needed.  If narcotic pain medicine is not needed, then you may take acetaminophen (Tylenol) or ibuprofen (Advil) as needed. ?Take your usually prescribed medications unless otherwise directed. ?If you need a refill on your pain medication, please contact your pharmacy. They will contact our office to request authorization.  Prescriptions will not be filled after 5pm or on week-ends. ?You should follow a light diet the first few days after arrival home, such as soup and crackers, pudding, etc.unless your doctor has advised otherwise. A high-fiber, low fat diet can be resumed as tolerated.   Be sure to include lots of fluids daily. Most patients will experience some swelling and bruising on the chest and neck area.  Ice packs will help.  Swelling and bruising can take several days to resolve ?Most patients will experience some swelling and bruising in the area of the incision. Ice pack will help. Swelling and bruising can take several days to resolve..  ?It is common to experience some constipation if taking pain medication after surgery.  Increasing fluid intake and taking a stool softener will usually help or prevent this problem from occurring.  A mild laxative (Milk of Magnesia or Miralax) should be taken according to package directions if there are no bowel movements after 48 hours. ? You may have steri-strips (small skin tapes) in place directly over the incision.  These strips should be left on the skin for 10-14 days.  If your surgeon used skin glue on  the incision, you may shower in 48 hours.  The glue will flake off over the next 2-3 weeks.  Any sutures or staples will be removed at the office during your follow-up visit. You may find that a light gauze bandage over your incision may keep your staples from being rubbed or pulled. You may shower and replace the bandage daily. ?ACTIVITIES:  You may resume regular (light) daily activities beginning the next day--such as daily self-care, walking, climbing stairs--gradually increasing activities as tolerated.  You may have sexual intercourse when it is comfortable.  Refrain from any heavy lifting or straining until approved by your doctor. ?You may drive when you no longer are taking prescription pain medication, you can comfortably wear a seatbelt, and you can safely maneuver your car and apply brakes ?Return to Work: __________8 weeks if applicable_________________________ ?You should see your doctor in the office for a follow-up appointment approximately two weeks after your surgery.  Make sure that you call for this appointment within a day or two after you arrive home to insure a convenient appointment time. ?OTHER INSTRUCTIONS:  ?_____________________________________________________________ ?_____________________________________________________________ ? ?WHEN TO CALL YOUR DOCTOR: ?Fever over 101.0 ?Inability to urinate ?Nausea and/or vomiting ?Extreme swelling or bruising ?Continued bleeding from incision. ?Increased pain, redness, or drainage from the incision. ?Difficulty swallowing or breathing ?Muscle cramping or spasms. ?Numbness or tingling in hands or feet or around lips. ? ?The clinic staff is available to answer your questions during regular business hours.  Please don?t hesitate to call and ask to speak to   one of the nurses if you have concerns. ? ?For further questions, please visit [email or web address] ? ? ?

## 2021-10-24 NOTE — Telephone Encounter (Signed)
Pt is needing a referral sent in for [facility] as he has been DC'd from the hospital today and was told he needs a referral sent in.  Please contact pt at [phone] once referral has been sent or if any more information is needed.

## 2021-10-24 NOTE — Telephone Encounter (Signed)
This should have been done at time of patient d/c if needed  We can discuss with pt at time of office visit further and see if needed

## 2021-10-24 NOTE — TOC Progression Note (Signed)
Transition of Care [facility]) - Progression Note    Patient Details  Name: Aaron Moss MRN: [id number] Date of Birth: 1945/11/20  Transition of Care [facility] CM/SW Contact  Alyas Creary, Edson Snowball, RN Phone Number: 10/24/2021, 9:32 AM  Clinical Narrative:     [place] for rolling walker    Malachy Mood with [facility] aware of discharge.  Orders entered for signature   Expected Discharge Plan: [place] Barriers to Discharge: Continued Medical Work up  Expected Discharge Plan and Services Expected Discharge Plan: [place]   Discharge Planning Services: CM Consult Post Acute Care Choice: [place] arrangements for the past 2 months: Single Family Home Expected Discharge Date: 10/24/21               DME Arranged: [place]ford Rile rolling DME Agency: [employer]       HH Arranged: PT HH Agency: [place] Date HH Agency Contacted: 10/21/21 Time [place]: [id number] Representative spoke with at [place]: [place] Determinants of Health ([place]) Interventions    Readmission Risk Interventions     No data to display

## 2021-10-26 NOTE — Telephone Encounter (Signed)
Notified pt with MD response.,.Aaron Moss

## 2021-10-27 LAB — TYPE AND SCREEN
ABO/RH(D): A POS
Antibody Screen: NEGATIVE
Unit division: 0
Unit division: 0
Unit division: 0
Unit division: 0

## 2021-10-27 LAB — BPAM RBC
Blood Product Expiration Date: 202308242359
Blood Product Expiration Date: 202308242359
Blood Product Expiration Date: 202308252359
Blood Product Expiration Date: 202308282359
ISSUE DATE / TIME: 202308020839
ISSUE DATE / TIME: 202308020839
Unit Type and Rh: 6200
Unit Type and Rh: 6200
Unit Type and Rh: 6200
Unit Type and Rh: 6200

## 2021-10-28 ENCOUNTER — Other Ambulatory Visit: Payer: Self-pay | Admitting: Internal Medicine

## 2021-10-31 ENCOUNTER — Ambulatory Visit (INDEPENDENT_AMBULATORY_CARE_PROVIDER_SITE_OTHER): Payer: Medicare Other | Admitting: Internal Medicine

## 2021-10-31 VITALS — BP 124/63 | HR 73 | Temp 98.4°F | Ht 66.0 in | Wt 206.0 lb

## 2021-10-31 DIAGNOSIS — D649 Anemia, unspecified: Secondary | ICD-10-CM

## 2021-10-31 DIAGNOSIS — E876 Hypokalemia: Secondary | ICD-10-CM

## 2021-10-31 DIAGNOSIS — I10 Essential (primary) hypertension: Secondary | ICD-10-CM

## 2021-10-31 DIAGNOSIS — C170 Malignant neoplasm of duodenum: Secondary | ICD-10-CM

## 2021-10-31 DIAGNOSIS — D500 Iron deficiency anemia secondary to blood loss (chronic): Secondary | ICD-10-CM | POA: Diagnosis not present

## 2021-10-31 HISTORY — DX: Anemia, unspecified: D64.9

## 2021-10-31 LAB — BASIC METABOLIC PANEL
BUN: 19 mg/dL (ref 6–23)
CO2: 26 mEq/L (ref 19–32)
Calcium: 9 mg/dL (ref 8.4–10.5)
Chloride: 109 mEq/L (ref 96–112)
Creatinine, Ser: 1.22 mg/dL (ref 0.40–1.50)
GFR: 57.03 mL/min — ABNORMAL LOW (ref >60.00)
Glucose, Bld: 103 mg/dL — ABNORMAL HIGH (ref 70–99)
Potassium: 4.8 mEq/L (ref 3.5–5.1)
Sodium: 141 mEq/L (ref 135–145)

## 2021-10-31 LAB — CBC WITH DIFFERENTIAL/PLATELET
Basophils Absolute: 0.1 10*3/uL (ref 0.0–0.1)
Basophils Relative: 1.2 % (ref 0.0–3.0)
Eosinophils Absolute: 0.7 10*3/uL (ref 0.0–0.7)
Eosinophils Relative: 8.2 % — ABNORMAL HIGH (ref 0.0–5.0)
HCT: 30.1 % — ABNORMAL LOW (ref 39.0–52.0)
Hemoglobin: 9.5 g/dL — ABNORMAL LOW (ref 13.0–17.0)
Lymphocytes Relative: 15.4 % (ref 12.0–46.0)
Lymphs Abs: 1.3 10*3/uL (ref 0.7–4.0)
MCHC: 31.4 g/dL (ref 30.0–36.0)
MCV: 72.5 fl — ABNORMAL LOW (ref 78.0–100.0)
Monocytes Absolute: 0.5 10*3/uL (ref 0.1–1.0)
Monocytes Relative: 5.7 % (ref 3.0–12.0)
Neutro Abs: 5.9 10*3/uL (ref 1.4–7.7)
Neutrophils Relative %: 69.5 % (ref 43.0–77.0)
Platelets: 338 10*3/uL (ref 150.0–400.0)
RBC: 4.15 Mil/uL — ABNORMAL LOW (ref 4.22–5.81)
RDW: 19.2 % — ABNORMAL HIGH (ref 11.5–15.5)
WBC: 8.5 10*3/uL (ref 4.0–10.5)

## 2021-10-31 NOTE — Progress Notes (Addendum)
Patient ID: DAILY CRATE, male   DOB: Mar 20, 1946, 76 y.o.   MRN: [id number]        Chief Complaint: follow up duodenal ca s/p incomplete resection, low k, low iron, htn       HPI:  Aaron Moss is a 76 y.o. male here after recent hospn with neg diagnostic laparoscopy for mets, then found tumor at the common hepatic artery unable to be resected, ng and foley removed postop day 2 and tolerated diet, oncology consulted, d/c home day 5 after some issue with low k and K dose increased at d/c.  Pt states has overall Lost 50 lbs over the past year or more.  Pt getting iron infusions per oncology  No new complaints - Denies worsening reflux, abd pain, dysphagia, n/v, bowel change or blood. Wt Readings from Last 3 Encounters:  10/31/21 206 lb (93.4 kg)  10/20/21 126 lb 15.8 oz (57.6 kg)  10/12/21 213 lb 11.2 oz (96.9 kg)   BP Readings from Last 3 Encounters:  10/31/21 124/63  10/24/21 139/60  10/12/21 (!) 144/58         Past Medical History:  Diagnosis Date   Carotid stenosis, left    Colon cancer ([place]) 2000   Coronary artery disease    per Dr. Jacalyn Lefevre note from 04/13/21   Diverticulosis of colon    GERD (gastroesophageal reflux disease)    Glaucoma    History of chemotherapy 2000   colon cancer   History of radiation therapy 08/06/13- 10/03/13   prostate 7800 cGy 40 sessions, seminal vesicles 5600 cGy 40 sessions   Hyperlipidemia    Hypertension    Left knee DJD    Prostate cancer ([place]) 05/23/2013   gleason 3+4=7, volume 11.5 ml   TIA (transient ischemic attack)    Past Surgical History:  Procedure Laterality Date   CHOLECYSTECTOMY N/A 10/19/2021   Procedure: CHOLECYSTECTOMY;  Surgeon: [name], MD;  Location: [place];  Service: General;  Laterality: N/A;   COLON SURGERY  2000   colon cancer   COLONOSCOPY     ENDARTERECTOMY Left 04/25/2021   Procedure: LEFT ENDARTERECTOMY CAROTID;  Surgeon: Marty Heck, MD;  Location: [place];  Service: Vascular;  Laterality: Left;    EUS     pancreatic cyst   KNEE ARTHROSCOPY  2007   LT- [facility]   LAPAROSCOPY N/A 10/19/2021   Procedure: LAPAROSCOPY DIAGNOSTIC;  Surgeon: [name], MD;  Location: [place];  Service: General;  Laterality: N/A;  GENERAL AND TAP BLOCK   PATCH ANGIOPLASTY Left 04/25/2021   Procedure: PATCH ANGIOPLASTY XENOSURE 1CM X 6CM;  Surgeon: Marty Heck, MD;  Location: [place];  Service: Vascular;  Laterality: Left;   PROSTATE BIOPSY  05/23/13   gleason 7, volume 11.5 ml   WHIPPLE PROCEDURE N/A 10/19/2021   Procedure: ATTEMPTED WHIPPLE PROCEDURE  WITH GASTRODUODENAL BYPASS;  Surgeon: [name], MD;  Location: [place];  Service: General;  Laterality: N/A;  GENERAL AND TAP BLOCK    reports that he quit smoking about 33 years ago. His smoking use included cigarettes. He has a 22.00 pack-year smoking history. He has never used smokeless tobacco. He reports that he does not currently use alcohol. He reports that he does not use drugs. family history includes Breast cancer in his maternal grandmother; Cancer in his brother, maternal aunt, and mother; Cancer (age of onset: 53) in his daughter; Colon cancer (age of onset: 71) in his sister; Diabetes in his brother. Allergies  Allergen Reactions   Lyrica [Pregabalin] Other (See Comments)    "Just made me feel bad"   Current Outpatient Medications on File Prior to Visit  Medication Sig Dispense Refill   amLODipine (NORVASC) 10 MG tablet Take 1 tablet by mouth once daily 90 tablet 3   aspirin EC 81 MG EC tablet Take 1 tablet (81 mg total) by mouth daily. Swallow whole. 30 tablet 11   atorvastatin (LIPITOR) 40 MG tablet Take 1 tablet by mouth once daily 90 tablet 2   benazepril (LOTENSIN) 40 MG tablet Take 1 tablet by mouth once daily 90 tablet 3   carvedilol (COREG) 12.5 MG tablet Take 12.5 mg by mouth 2 (two) times daily.     iron polysaccharides (FERREX 150) 150 MG capsule Take 1 capsule by mouth twice daily 180 capsule 0   oxyCODONE (OXY IR/ROXICODONE) 5  MG immediate release tablet Take 1 tablet (5 mg total) by mouth every 4 (four) hours as needed for moderate pain. 20 tablet 0   pantoprazole (PROTONIX) 40 MG tablet Take 1 tablet (40 mg total) by mouth daily. 90 tablet 3   potassium chloride 20 MEQ TBCR Take 20 mEq by mouth 3 (three) times daily. 90 tablet 0   prochlorperazine (COMPAZINE) 10 MG tablet Take 1 tablet (10 mg total) by mouth every 6 (six) hours as needed for nausea or vomiting (Use for nausea and / or vomiting unresolved with ondansetron (Zofran).). 30 tablet 0   traMADol (ULTRAM) 50 MG tablet TAKE 1 TABLET BY MOUTH EVERY 6 HOURS AS NEEDED FOR PAIN 120 tablet 1   Travoprost, BAK Free, (TRAVATAN) 0.004 % SOLN ophthalmic solution Place 1 drop into both eyes at bedtime.     VITAMIN D PO Take 2 tablets by mouth daily.     No current facility-administered medications on file prior to visit.        ROS:  All others reviewed and negative.  Objective        PE:  BP 124/63 (BP Location: Left Arm, Patient Position: Sitting, Cuff Size: Large)   Pulse 73   Temp 98.4 F (36.9 C) (Oral)   Ht '5\' 6"'$  (1.676 m)   Wt 206 lb (93.4 kg)   SpO2 95%   BMI 33.25 kg/m                 Constitutional: Pt appears in NAD               HENT: Head: NCAT.                Right Ear: External ear normal.                 Left Ear: External ear normal.                Eyes: . Pupils are equal, round, and reactive to light. Conjunctivae and EOM are normal               Nose: without d/c or deformity               Neck: Neck supple. Gross normal ROM               Cardiovascular: Normal rate and regular rhythm.                 Pulmonary/Chest: Effort normal and breath sounds without rales or wheezing.                Abd:  Soft, NT, ND, + BS, no organomegaly               Neurological: Pt is alert. At baseline orientation, motor grossly intact               Skin: Skin is warm. No rashes, no other new lesions, LE edema - none               Psychiatric: Pt  behavior is normal without agitation   Micro: none  Cardiac tracings I have personally interpreted today:  none  Pertinent Radiological findings (summarize): none   Lab Results  Component Value Date   WBC 8.5 10/31/2021   HGB 9.5 (L) 10/31/2021   HCT 30.1 (L) 10/31/2021   PLT 338.0 10/31/2021   GLUCOSE 103 (H) 10/31/2021   CHOL 77 04/26/2021   TRIG 62 04/26/2021   HDL 20 (L) 04/26/2021   LDLDIRECT 185.6 05/15/2007   LDLCALC 45 04/26/2021   ALT 34 10/24/2021   AST 25 10/24/2021   NA 141 10/31/2021   K 4.8 10/31/2021   CL 109 10/31/2021   CREATININE 1.22 10/31/2021   BUN 19 10/31/2021   CO2 26 10/31/2021   TSH 0.852 09/28/2021   PSA 0.08 (L) 07/07/2020   INR 1.2 10/22/2021   HGBA1C 5.6 10/12/2021   Assessment/Plan:  Aaron Moss is a 76 y.o. Black or African American [2] male with  has a past medical history of Carotid stenosis, left, Colon cancer ([place]) (2000), Coronary artery disease, Diverticulosis of colon, GERD (gastroesophageal reflux disease), Glaucoma, History of chemotherapy (2000), History of radiation therapy (08/06/13- 10/03/13), Hyperlipidemia, Hypertension, Left knee DJD, Prostate cancer ([place]) (05/23/2013), and TIA (transient ischemic attack).  Hypertension, uncontrolled BP Readings from Last 3 Encounters:  10/31/21 124/63  10/24/21 139/60  10/12/21 (!) 144/58   Stable, pt to continue medical treatment norvasc 10 qd, lotension 40 qd, coreg 12.5 bid   Hypokalemia Lab Results  Component Value Date   K 4.8 10/31/2021   Stable, cont current K dosing 20 meq tid  Iron deficiency anemia Lab Results  Component Value Date   WBC 8.5 10/31/2021   HGB 9.5 (L) 10/31/2021   HCT 30.1 (L) 10/31/2021   MCV 72.5 (L) 10/31/2021   PLT 338.0 10/31/2021  overall stable, followed closely per oncolgoy with iron infusions  Adenocarcinoma of duodenum (HCC) Pt to continue f/u oncology as planned after incomplete resection  Followup: Return in about 3 months (around  01/31/2022).  Cathlean Cower, MD 11/04/2021 9:56 PM [facility]

## 2021-10-31 NOTE — Patient Instructions (Signed)
Please continue all other medications as before, and refills have been done if requested.  Please have the pharmacy call with any other refills you may need.  Please continue your efforts at being more active, low cholesterol diet, and weight control.  You are otherwise up to date with prevention measures today.  Please keep your appointments with your specialists as you may have planned - the staples removed next wk, and Oncology next wk  Please make an Appointment to return in 3 months, or sooner if needed

## 2021-11-03 ENCOUNTER — Encounter: Payer: Self-pay | Admitting: Nurse Practitioner

## 2021-11-03 ENCOUNTER — Other Ambulatory Visit (HOSPITAL_COMMUNITY): Payer: Self-pay

## 2021-11-03 ENCOUNTER — Encounter: Payer: Self-pay | Admitting: Hematology

## 2021-11-03 MED FILL — Polysaccharide Iron Complex Cap 150 MG (Iron Equivalent): ORAL | 5 days supply | Qty: 10 | Fill #0 | Status: AC

## 2021-11-03 MED FILL — Polysaccharide Iron Complex Cap 150 MG (Iron Equivalent): ORAL | 85 days supply | Qty: 170 | Fill #0 | Status: AC

## 2021-11-04 ENCOUNTER — Encounter: Payer: Self-pay | Admitting: Internal Medicine

## 2021-11-04 DIAGNOSIS — Z8546 Personal history of malignant neoplasm of prostate: Secondary | ICD-10-CM | POA: Insufficient documentation

## 2021-11-04 NOTE — Assessment & Plan Note (Signed)
Pt to continue f/u oncology as planned after incomplete resection

## 2021-11-04 NOTE — Assessment & Plan Note (Signed)
Lab Results  Component Value Date   WBC 8.5 10/31/2021   HGB 9.5 (L) 10/31/2021   HCT 30.1 (L) 10/31/2021   MCV 72.5 (L) 10/31/2021   PLT 338.0 10/31/2021  overall stable, followed closely per oncolgoy with iron infusions

## 2021-11-04 NOTE — Assessment & Plan Note (Addendum)
Lab Results  Component Value Date   K 4.8 10/31/2021   Stable, cont current K dosing 20 meq tid

## 2021-11-04 NOTE — Assessment & Plan Note (Signed)
BP Readings from Last 3 Encounters:  10/31/21 124/63  10/24/21 139/60  10/12/21 (!) 144/58   Stable, pt to continue medical treatment norvasc 10 qd, lotension 40 qd, coreg 12.5 bid

## 2021-11-07 ENCOUNTER — Other Ambulatory Visit (HOSPITAL_COMMUNITY): Payer: Self-pay

## 2021-11-09 ENCOUNTER — Encounter: Payer: Self-pay | Admitting: Hematology

## 2021-11-09 ENCOUNTER — Inpatient Hospital Stay: Payer: Medicare Other | Attending: Nurse Practitioner | Admitting: Hematology

## 2021-11-09 ENCOUNTER — Other Ambulatory Visit: Payer: Self-pay

## 2021-11-09 VITALS — BP 150/58 | HR 71 | Temp 97.9°F | Resp 18 | Ht 66.0 in | Wt 210.4 lb

## 2021-11-09 DIAGNOSIS — Z9221 Personal history of antineoplastic chemotherapy: Secondary | ICD-10-CM | POA: Diagnosis not present

## 2021-11-09 DIAGNOSIS — D508 Other iron deficiency anemias: Secondary | ICD-10-CM | POA: Diagnosis present

## 2021-11-09 DIAGNOSIS — Z7902 Long term (current) use of antithrombotics/antiplatelets: Secondary | ICD-10-CM | POA: Insufficient documentation

## 2021-11-09 DIAGNOSIS — Z79899 Other long term (current) drug therapy: Secondary | ICD-10-CM | POA: Insufficient documentation

## 2021-11-09 DIAGNOSIS — C170 Malignant neoplasm of duodenum: Secondary | ICD-10-CM | POA: Diagnosis present

## 2021-11-09 DIAGNOSIS — Z7982 Long term (current) use of aspirin: Secondary | ICD-10-CM | POA: Diagnosis not present

## 2021-11-09 DIAGNOSIS — Z8673 Personal history of transient ischemic attack (TIA), and cerebral infarction without residual deficits: Secondary | ICD-10-CM | POA: Insufficient documentation

## 2021-11-09 DIAGNOSIS — Z923 Personal history of irradiation: Secondary | ICD-10-CM | POA: Diagnosis not present

## 2021-11-09 MED ORDER — LIDOCAINE-PRILOCAINE 2.5-2.5 % EX CREA: TOPICAL_CREAM | CUTANEOUS | 3 refills | Status: DC

## 2021-11-09 NOTE — Progress Notes (Signed)
[place]   Telephone:([phone]) [phone] Fax:(336) [phone]   Clinic Follow up Note   Patient Care Team: Biagio Borg, MD as PCP - General Stanford Breed Denice Bors, MD as PCP - Cardiology (Cardiology) [name], MD as Consulting Physician (Gastroenterology) [name], MD as Consulting Physician (Orthopedic Surgery) [name], MD as Consulting Physician (Ophthalmology) [name], MD as Consulting Physician (Oncology) Royston Bake, RN as Oncology Nurse Navigator (Oncology)  Date of Service:  11/09/2021  CHIEF COMPLAINT: f/u of duodenal cancer  CURRENT THERAPY:  Pending FOLFOX  ASSESSMENT & PLAN:  Aaron Moss is a 76 y.o. male with   1. 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  Although the scope could not pass patient has no signs of obstruction. Biopsy confirmed moderate-poorly differentiated adenocarcinoma -The surgery was delayed due to his stroke on 04/18/2021 -he received 8 cycles neoadjuvant Keytruda 05/11/21 - 09/28/21. He has tolerated well with mild fatigue.  -restaging CT AP on 08/02/21 showed: similar duodenal wall thickening; decreased size of upper abdominal nodes; no new or progressive disease.  -he was taken for Whipple surgery on 10/19/21. However, during the procedure, Dr. Barry Dienes found that the tumor was unable to be resected due to obscuring of hepatic artery.  He underwent gastric bypass surgery due to the pending obstruction. --I reviewed the operative findings with them today.  We discussed that his cancer is not curable, but is still treatable.  Given the limited response to chemotherapy before surgery, I recommend chemotherapy FOLFOX for 3-6 months followed by consolidation radiation therapy, will review this in GI tumor board. I will refer him back to Dr.  Barry Dienes for port placement --Chemotherapy consent: Side effects including but does not not limited to, fatigue, nausea, vomiting, diarrhea, hair loss, neuropathy, fluid retention, renal and kidney dysfunction, neutropenic fever, needed for blood transfusion, bleeding, were discussed with patient in great detail. He agrees to proceed. -The goal of chemotherapy is palliative, for disease control and to prolong his life. -I also recommend a new baseline CT, will try to obtain prior to starting treatment in 2 weeks   2. Lynch Syndrome, H/o prostate (2015) and colon (2000) cancers -he has a personal history of stage pT3N0 colon cancer in 2000 and prostate cancer dx in 2015, s/p 8 weeks of IMRT with Dr. Valere Dross. -genetic testing and counseling on 04/07/21 confirmed a PMS2 mutation, as well as VUS in ATM, BLM, and SDHA.   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 -he received three doses IV Venofer 2/22-05/25/21 and one on 06/23/21. -he is on oral iron.   4. Comorbidities: TIA, s/p left endarterectomy carotid on 04/25/21; Hypokalemia; HTN -TIA on 04/18/21, on aspirin and Plavix, no residual neurodeficits -he has chronic low potassium, on KCL.     Plan -chemo class -referral back to Dr. Barry Dienes for port placement -CT to be done soon -lab, flush, f/u, and FOLFOX in 2 weeks   No problem-specific Assessment & Plan notes found for this encounter.   SUMMARY OF ONCOLOGIC HISTORY: Oncology History  Adenocarcinoma of duodenum ([place])  03/01/2021 Imaging   EXAM: ABDOMEN ULTRASOUND COMPLETE  IMPRESSION: 1. Simple appearing right  renal cyst. 2. Otherwise negative abdominal ultrasound   03/18/2021 Procedure   Upper Endoscopy/Colonoscopy, Dr. Loletha Carrow  Upper Endoscopy Impression: - Normal esophagus. - A single submucosal papule  (nodule) found in the stomach. - Duodenal mass. Biopsied. Concerning for malignancy.  Colonoscopy Impression: - Diverticulosis in the entire examined colon. - The examined portion of the ileum was normal. - Patent end-to-end colo-colonic anastomosis, characterized by healthy appearing mucosa. - Internal hemorrhoids. - No specimens collected.   03/18/2021 Initial Biopsy   Diagnosis Duodenum, Biopsy - INVASIVE MODERATE TO POORLY DIFFERENTIATED ADENOCARCINOMA   03/18/2021 Cancer Staging   Staging form: Small Intestine - Adenocarcinoma, AJCC 8th Edition - Clinical stage from 03/18/2021: Stage IIIA (cTX, cN1, cM0) - Signed by [name], MD on 06/22/2021 Stage prefix: Initial diagnosis   03/22/2021 Initial Diagnosis   Adenocarcinoma of duodenum ([place])   04/01/2021 Imaging   EXAM: CT CHEST, ABDOMEN, AND PELVIS WITH CONTRAST  IMPRESSION: 1. Circumferential wall thickening of the first portion of the duodenum, in keeping with known primary malignancy. 2. Enlarged, hypodense lymph nodes anterior to the pancreatic head and in the portacaval station, concerning for nodal metastatic disease. 3. No other evidence of metastatic disease in the chest, abdomen, or pelvis. 4. Incidental note of a fluid attenuation lesion within the proximal pancreatic body measuring 1.9 x 1.4 cm, with prominence of the pancreatic duct distally, up to 0.6 cm. This lesion was present on remote prior examination dated 05/04/2009, and has slightly increased in size over a long period of time. This is consistent with a small IPMN and benign given indolent behavior over greater than 10 years. 5. Background of very fine centrilobular pulmonary nodules, most concentrated in the lung apices, most commonly seen in smoking-related respiratory bronchiolitis. 6. Coronary artery disease.   Aortic Atherosclerosis (ICD10-I70.0).    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 Of note, a variant of uncertain significance was detected in the ATM (p.A869G), BLM (c.800-3T>G), and SDHA (p.V632F) genes. Report date is 04/27/2021.  The CancerNext-Expanded gene panel offered by [facility] and includes sequencing, rearrangement, and RNA analysis for the following 77 genes: AIP, ALK, APC, ATM, AXIN2, BAP1, BARD1, BLM, BMPR1A, BRCA1, BRCA2, BRIP1, CDC73, CDH1, CDK4, CDKN1B, CDKN2A, CHEK2, CTNNA1, DICER1, FANCC, FH, FLCN, GALNT12, KIF1B, LZTR1, MAX, MEN1, MET, MLH1, MSH2, MSH3, MSH6, MUTYH, NBN, NF1, NF2, NTHL1, PALB2, PHOX2B, PMS2, POT1, PRKAR1A, PTCH1, PTEN, RAD51C, RAD51D, RB1, RECQL, RET, SDHA, SDHAF2, SDHB, SDHC, SDHD, SMAD4, SMARCA4, SMARCB1, SMARCE1, STK11, SUFU, TMEM127, TP53, TSC1, TSC2, VHL and XRCC2 (sequencing and deletion/duplication); EGFR, EGLN1, HOXB13, KIT, MITF, PDGFRA, POLD1, and POLE (sequencing only); EPCAM and GREM1 (deletion/duplication only).    05/11/2021 -  Chemotherapy   Patient is on Treatment Plan : COLORECTAL Pembrolizumab (200) q21d        INTERVAL HISTORY:  Aaron Moss is here for a follow up of duodenal cancer. He was last seen by me on 09/28/21. He presents to the clinic accompanied by his wife and daughter. He denies pain or nausea. He reports appetite is good and BM are regular. They report he has fatigue, but he notes he still does everything for himself.   All other systems were reviewed with the patient and are negative.  MEDICAL HISTORY:  Past Medical History:  Diagnosis Date   Carotid stenosis, left    Colon cancer ([place]) 2000   Coronary artery disease    per Dr. Jacalyn Lefevre  note from 04/13/21   Diverticulosis of colon    GERD (gastroesophageal reflux disease)    Glaucoma    History of chemotherapy 2000   colon cancer   History of radiation therapy 08/06/13- 10/03/13   prostate 7800 cGy 40 sessions, seminal vesicles 5600 cGy 40 sessions   Hyperlipidemia    Hypertension     Left knee DJD    Prostate cancer ([facility]) 05/23/2013   gleason 3+4=7, volume 11.5 ml   TIA (transient ischemic attack)     SURGICAL HISTORY: Past Surgical History:  Procedure Laterality Date   CHOLECYSTECTOMY N/A 10/19/2021   Procedure: CHOLECYSTECTOMY;  Surgeon: Byerly, Faera, MD;  Location: [facility] OR;  Service: General;  Laterality: N/A;   COLON SURGERY  2000   colon cancer   COLONOSCOPY     ENDARTERECTOMY Left 04/25/2021   Procedure: LEFT ENDARTERECTOMY CAROTID;  Surgeon: Clark, Christopher J, MD;  Location: [facility] OR;  Service: Vascular;  Laterality: Left;   EUS     pancreatic cyst   KNEE ARTHROSCOPY  2007   LT- [facility]   LAPAROSCOPY N/A 10/19/2021   Procedure: LAPAROSCOPY DIAGNOSTIC;  Surgeon: Byerly, Faera, MD;  Location: [facility] OR;  Service: General;  Laterality: N/A;  GENERAL AND TAP BLOCK   PATCH ANGIOPLASTY Left 04/25/2021   Procedure: PATCH ANGIOPLASTY XENOSURE 1CM X 6CM;  Surgeon: Clark, Christopher J, MD;  Location: [facility] OR;  Service: Vascular;  Laterality: Left;   PROSTATE BIOPSY  05/23/13   gleason 7, volume 11.5 ml   WHIPPLE PROCEDURE N/A 10/19/2021   Procedure: ATTEMPTED WHIPPLE PROCEDURE  WITH GASTRODUODENAL BYPASS;  Surgeon: Byerly, Faera, MD;  Location: [facility] OR;  Service: General;  Laterality: N/A;  GENERAL AND TAP BLOCK    I have reviewed the social history and family history with the patient and they are unchanged from previous note.  ALLERGIES:  is allergic to lyrica [pregabalin].  MEDICATIONS:  Current Outpatient Medications  Medication Sig Dispense Refill   amLODipine (NORVASC) 10 MG tablet Take 1 tablet by mouth once daily 90 tablet 3   aspirin EC 81 MG EC tablet Take 1 tablet (81 mg total) by mouth daily. Swallow whole. 30 tablet 11   atorvastatin (LIPITOR) 40 MG tablet Take 1 tablet by mouth once daily 90 tablet 2   benazepril (LOTENSIN) 40 MG tablet Take 1 tablet by mouth once daily 90 tablet 3   carvedilol (COREG) 12.5 MG tablet Take 12.5 mg by mouth 2 (two) times daily.      iron polysaccharides (FERREX 150) 150 MG capsule Take 1 capsule by mouth twice daily 180 capsule 0   oxyCODONE (OXY IR/ROXICODONE) 5 MG immediate release tablet Take 1 tablet (5 mg total) by mouth every 4 (four) hours as needed for moderate pain. 20 tablet 0   pantoprazole (PROTONIX) 40 MG tablet Take 1 tablet (40 mg total) by mouth daily. 90 tablet 3   potassium chloride 20 MEQ TBCR Take 20 mEq by mouth 3 (three) times daily. 90 tablet 0   prochlorperazine (COMPAZINE) 10 MG tablet Take 1 tablet (10 mg total) by mouth every 6 (six) hours as needed for nausea or vomiting (Use for nausea and / or vomiting unresolved with ondansetron (Zofran).). 30 tablet 0   traMADol (ULTRAM) 50 MG tablet TAKE 1 TABLET BY MOUTH EVERY 6 HOURS AS NEEDED FOR PAIN 120 tablet 1   Travoprost, BAK Free, (TRAVATAN) 0.004 % SOLN ophthalmic solution Place 1 drop into both eyes at bedtime.     VITAMIN D   PO Take 2 tablets by mouth daily.     No current facility-administered medications for this visit.    PHYSICAL EXAMINATION: ECOG PERFORMANCE STATUS: 1 - Symptomatic but completely ambulatory  Vitals:   11/09/21 1432  BP: (!) 150/58  Pulse: 71  Resp: 18  Temp: 97.9 F (36.6 C)  SpO2: 100%   Wt Readings from Last 3 Encounters:  11/09/21 210 lb 6.4 oz (95.4 kg)  10/31/21 206 lb (93.4 kg)  10/20/21 126 lb 15.8 oz (57.6 kg)     GENERAL:alert, no distress and comfortable SKIN: skin color normal, no rashes or significant lesions EYES: normal, Conjunctiva are pink and non-injected, sclera clear  NEURO: alert & oriented x 3 with fluent speech  LABORATORY DATA:  I have reviewed the data as listed    Latest Ref Rng & Units 10/31/2021   12:11 PM 10/24/2021    1:21 AM 10/23/2021    1:31 AM  CBC  WBC 4.0 - 10.5 K/uL 8.5  7.8  8.5   Hemoglobin 13.0 - 17.0 g/dL 9.5  9.0  9.6   Hematocrit 39.0 - 52.0 % 30.1  28.6  30.5   Platelets 150.0 - 400.0 K/uL 338.0  262  253         Latest Ref Rng & Units 10/31/2021   12:11  PM 10/24/2021    1:21 AM 10/23/2021    1:31 AM  CMP  Glucose 70 - 99 mg/dL 103  81  87   BUN 6 - 23 mg/dL _0 Creatinine 0.40 - 1.50 mg/dL 1.22  1.07  1.08   Sodium 135 - 145 mEq/L 141  142  137   Potassium 3.5 - 5.1 mEq/L 4.8  3.2  3.5   Chloride 96 - 112 mEq/L 109  110  107   CO2 19 - 32 mEq/L _1 Calcium 8.4 - 10.5 mg/dL 9.0  8.4  8.4   Total Protein 6.5 - 8.1 g/dL  5.5  5.8   Total Bilirubin 0.3 - 1.2 mg/dL  1.2  1.2   Alkaline Phos 38 - 126 U/L  72  81   AST 15 - 41 U/L  25  33   ALT 0 - 44 U/L  34  35       RADIOGRAPHIC STUDIES: I have personally reviewed the radiological images as listed and agreed with the findings in the report. No results found.    Orders Placed This Encounter  Procedures   CT CHEST ABDOMEN PELVIS W CONTRAST    Standing Status:   Future    Standing Expiration Date:   11/10/2022    Order Specific Question:   Preferred imaging location?    Answer:   [facility]    Order Specific Question:   Is Oral Contrast requested for this exam?    Answer:   Yes, Per Radiology protocol   [facility] (IN HOUSE-[facility])FOR [facility] ONLY    Standing Status:   Future    Standing Expiration Date:   11/10/2022   All questions were answered. The patient knows to call the clinic with any problems, questions or concerns. No barriers to learning was detected. The total time spent in the appointment was 40 minutes.     [name], MD 11/09/2021   I, Wilburn Mylar, am acting as scribe for [name], MD.   I have reviewed the above documentation for accuracy and completeness, and I agree with the  above.

## 2021-11-09 NOTE — Progress Notes (Signed)
DISCONTINUE OFF PATHWAY REGIMEN - Other   [id number]:Pembrolizumab 200 mg IV D1 q21 Days:   A cycle is every 21 days:     Pembrolizumab   **Always confirm dose/schedule in your pharmacy ordering system**  REASON: Disease Progression PRIOR TREATMENT: Pembrolizumab 200 mg IV D1 q21 Days TREATMENT RESPONSE: Partial Response (PR)  START OFF PATHWAY REGIMEN - Other   [id number]:mFOLFOX6 (Leucovorin IV D1 + Fluorouracil IV D1/CIV D1,2 + Oxaliplatin IV D1) q14 Days:   A cycle is every 14 days:     Oxaliplatin      Leucovorin      Fluorouracil      Fluorouracil   **Always confirm dose/schedule in your pharmacy ordering system**  Patient Characteristics: Intent of Therapy: Non-Curative / Palliative Intent, Discussed with Patient

## 2021-11-10 ENCOUNTER — Other Ambulatory Visit: Payer: Self-pay | Admitting: General Surgery

## 2021-11-14 ENCOUNTER — Other Ambulatory Visit: Payer: Self-pay | Admitting: General Surgery

## 2021-11-15 ENCOUNTER — Telehealth: Payer: Self-pay

## 2021-11-15 ENCOUNTER — Telehealth: Payer: Self-pay | Admitting: Hematology

## 2021-11-15 NOTE — Telephone Encounter (Signed)
This nurse returned call to patient who wanted to know if he can do his chemo teaching over the phone on 8/30.  This nurse advised patient that a message was sent to that department making them aware of his request.  Patient also request to know what time is for placement is scheduled for  This nurse advised that the surgery time is 145 pm and this nurse advised that he needs to arrive at 1pm on 8/31 to [facility] Patient acknowledged understanding.  No further questions or concerns noted at this time.

## 2021-11-15 NOTE — Telephone Encounter (Signed)
Scheduled follow-up appointments per 8/23 los. Patient is aware. 

## 2021-11-16 ENCOUNTER — Inpatient Hospital Stay: Payer: Medicare Other

## 2021-11-16 ENCOUNTER — Encounter (HOSPITAL_COMMUNITY): Payer: Self-pay | Admitting: General Surgery

## 2021-11-16 DIAGNOSIS — C170 Malignant neoplasm of duodenum: Secondary | ICD-10-CM

## 2021-11-16 NOTE — Progress Notes (Signed)
PCP - Dr Cathlean Cower Cardiologist - Dr Kirk Ruths Oncology - Dr [name]  CT Chest x-ray - 04/01/21 EKG - 04/18/21 Stress Test - n/a ECHO - 04/01/21 Cardiac Cath - n/a  ICD Pacemaker/Loop - n/a  Sleep Study -  n/a CPAP - none  Aspirin Instructions: Follow your surgeon's instructions on when to stop aspirin prior to surgery,  If no instructions were given by your surgeon then you will need to call the office for those instructions.  ERAS: Clear liquids til 11 AM DOS  Anesthesia review: Yes  STOP now taking any Aspirin (unless otherwise instructed by your surgeon), Aleve, Naproxen, Ibuprofen, Motrin, Advil, Goody's, BC's, all herbal medications, fish oil, and all vitamins.   Coronavirus Screening Do you have any of the following symptoms:  Cough yes/no: No Fever (>100.39F)  yes/no: No Runny nose yes/no: No Sore throat yes/no: No Difficulty breathing/shortness of breath  yes/no: No  Have you traveled in the last 14 days and where? yes/no: No  Patient verbalized understanding of instructions that were given via phone.

## 2021-11-16 NOTE — Progress Notes (Signed)
Pharmacist Chemotherapy Monitoring - Initial Assessment    Anticipated start date: 11/24/21   The following has been reviewed per standard work regarding the patient's treatment regimen: The patient's diagnosis, treatment plan and drug doses, and organ/hematologic function Lab orders and baseline tests specific to treatment regimen  The treatment plan start date, drug sequencing, and pre-medications Prior authorization status  Patient's documented medication list, including drug-drug interaction screen and prescriptions for anti-emetics and supportive care specific to the treatment regimen The drug concentrations, fluid compatibility, administration routes, and timing of the medications to be used The patient's access for treatment and lifetime cumulative dose history, if applicable  The patient's medication allergies and previous infusion related reactions, if applicable   Changes made to treatment plan:  N/A  Follow up needed:  N/A   Larene Beach, RPH, 11/16/2021  4:42 PM

## 2021-11-17 ENCOUNTER — Ambulatory Visit (HOSPITAL_BASED_OUTPATIENT_CLINIC_OR_DEPARTMENT_OTHER): Payer: Medicare Other | Admitting: Physician Assistant

## 2021-11-17 ENCOUNTER — Ambulatory Visit (HOSPITAL_COMMUNITY): Payer: Medicare Other

## 2021-11-17 ENCOUNTER — Other Ambulatory Visit: Payer: Self-pay

## 2021-11-17 ENCOUNTER — Ambulatory Visit (HOSPITAL_COMMUNITY): Payer: Medicare Other | Admitting: Physician Assistant

## 2021-11-17 ENCOUNTER — Encounter (HOSPITAL_COMMUNITY)
Admission: RE | Disposition: A | Discharge status: Home / Self Care | Payer: Self-pay | Source: Ambulatory Visit | Attending: General Surgery

## 2021-11-17 ENCOUNTER — Ambulatory Visit (HOSPITAL_COMMUNITY)
Admission: RE | Admit: 2021-11-17 | Discharge: 2021-11-17 | Disposition: A | Discharge status: Home / Self Care | Payer: Medicare Other | Source: Ambulatory Visit | Attending: General Surgery | Admitting: General Surgery

## 2021-11-17 ENCOUNTER — Encounter (HOSPITAL_COMMUNITY): Payer: Self-pay | Admitting: General Surgery

## 2021-11-17 DIAGNOSIS — Z8673 Personal history of transient ischemic attack (TIA), and cerebral infarction without residual deficits: Secondary | ICD-10-CM | POA: Insufficient documentation

## 2021-11-17 DIAGNOSIS — Z87891 Personal history of nicotine dependence: Secondary | ICD-10-CM

## 2021-11-17 DIAGNOSIS — I10 Essential (primary) hypertension: Secondary | ICD-10-CM

## 2021-11-17 DIAGNOSIS — Z8546 Personal history of malignant neoplasm of prostate: Secondary | ICD-10-CM | POA: Insufficient documentation

## 2021-11-17 DIAGNOSIS — Z9221 Personal history of antineoplastic chemotherapy: Secondary | ICD-10-CM | POA: Insufficient documentation

## 2021-11-17 DIAGNOSIS — K219 Gastro-esophageal reflux disease without esophagitis: Secondary | ICD-10-CM | POA: Insufficient documentation

## 2021-11-17 DIAGNOSIS — M1990 Unspecified osteoarthritis, unspecified site: Secondary | ICD-10-CM | POA: Diagnosis not present

## 2021-11-17 DIAGNOSIS — C170 Malignant neoplasm of duodenum: Secondary | ICD-10-CM

## 2021-11-17 DIAGNOSIS — Z79899 Other long term (current) drug therapy: Secondary | ICD-10-CM | POA: Diagnosis not present

## 2021-11-17 DIAGNOSIS — Z85038 Personal history of other malignant neoplasm of large intestine: Secondary | ICD-10-CM | POA: Diagnosis not present

## 2021-11-17 HISTORY — PX: PORTACATH PLACEMENT: SHX2246

## 2021-11-17 SURGERY — INSERTION, TUNNELED CENTRAL VENOUS DEVICE, WITH PORT
Anesthesia: General | Site: Chest | Laterality: Left

## 2021-11-17 MED ORDER — EPHEDRINE SULFATE-NACL 50-0.9 MG/10ML-% IV SOSY
PREFILLED_SYRINGE | INTRAVENOUS | Status: DC | PRN
  Administered 2021-11-17 (×2): 10 mg via INTRAVENOUS
  Administered 2021-11-17: 5 mg via INTRAVENOUS

## 2021-11-17 MED ORDER — OXYCODONE HCL 5 MG PO TABS: 5.0000 mg | ORAL_TABLET | Freq: Four times a day (QID) | ORAL | 0 refills | Status: DC | PRN

## 2021-11-17 MED ORDER — ORAL CARE MOUTH RINSE: 15.0000 mL | Freq: Once | OROMUCOSAL | Status: AC

## 2021-11-17 MED ORDER — PHENYLEPHRINE 80 MCG/ML (10ML) SYRINGE FOR IV PUSH (FOR BLOOD PRESSURE SUPPORT)
PREFILLED_SYRINGE | INTRAVENOUS | Status: DC | PRN
  Administered 2021-11-17: 80 ug via INTRAVENOUS
  Administered 2021-11-17: 160 ug via INTRAVENOUS
  Administered 2021-11-17 (×3): 80 ug via INTRAVENOUS

## 2021-11-17 MED ORDER — LIDOCAINE 2% (20 MG/ML) 5 ML SYRINGE
INTRAMUSCULAR | Status: DC | PRN
  Administered 2021-11-17: 60 mg via INTRAVENOUS

## 2021-11-17 MED ORDER — HEPARIN 6000 UNIT IRRIGATION SOLUTION
Status: AC
  Filled 2021-11-17: qty 500

## 2021-11-17 MED ORDER — CHLORHEXIDINE GLUCONATE CLOTH 2 % EX PADS: 6.0000 | MEDICATED_PAD | Freq: Once | CUTANEOUS | Status: DC

## 2021-11-17 MED ORDER — ONDANSETRON HCL 4 MG/2ML IJ SOLN: 4.0000 mg | Freq: Four times a day (QID) | INTRAMUSCULAR | Status: DC | PRN

## 2021-11-17 MED ORDER — HEPARIN 6000 UNIT IRRIGATION SOLUTION
Status: DC | PRN
  Administered 2021-11-17: 1

## 2021-11-17 MED ORDER — ONDANSETRON HCL 4 MG/2ML IJ SOLN
INTRAMUSCULAR | Status: AC
  Filled 2021-11-17: qty 4

## 2021-11-17 MED ORDER — OXYCODONE HCL 5 MG/5ML PO SOLN: 5.0000 mg | Freq: Once | ORAL | Status: DC | PRN

## 2021-11-17 MED ORDER — LIDOCAINE HCL (PF) 1 % IJ SOLN
INTRAMUSCULAR | Status: AC
  Filled 2021-11-17: qty 30

## 2021-11-17 MED ORDER — DEXAMETHASONE SODIUM PHOSPHATE 10 MG/ML IJ SOLN
INTRAMUSCULAR | Status: DC | PRN
  Administered 2021-11-17: 5 mg via INTRAVENOUS

## 2021-11-17 MED ORDER — FENTANYL CITRATE (PF) 250 MCG/5ML IJ SOLN
INTRAMUSCULAR | Status: AC
  Filled 2021-11-17: qty 5

## 2021-11-17 MED ORDER — ACETAMINOPHEN 500 MG PO TABS
ORAL_TABLET | ORAL | Status: AC
  Administered 2021-11-17: 1000 mg via ORAL
  Filled 2021-11-17: qty 2

## 2021-11-17 MED ORDER — DEXAMETHASONE SODIUM PHOSPHATE 10 MG/ML IJ SOLN
INTRAMUSCULAR | Status: AC
  Filled 2021-11-17: qty 2

## 2021-11-17 MED ORDER — FENTANYL CITRATE (PF) 250 MCG/5ML IJ SOLN
INTRAMUSCULAR | Status: DC | PRN
Start: 2021-11-17 — End: 2021-11-17
  Administered 2021-11-17: 25 ug via INTRAVENOUS
  Administered 2021-11-17: 100 ug via INTRAVENOUS
  Administered 2021-11-17: 25 ug via INTRAVENOUS

## 2021-11-17 MED ORDER — EPHEDRINE 5 MG/ML INJ
INTRAVENOUS | Status: AC
  Filled 2021-11-17: qty 5

## 2021-11-17 MED ORDER — CHLORHEXIDINE GLUCONATE 0.12 % MT SOLN
OROMUCOSAL | Status: AC
  Administered 2021-11-17: 15 mL via OROMUCOSAL
  Filled 2021-11-17: qty 15

## 2021-11-17 MED ORDER — FENTANYL CITRATE (PF) 100 MCG/2ML IJ SOLN: 25.0000 ug | INTRAMUSCULAR | Status: DC | PRN

## 2021-11-17 MED ORDER — CEFAZOLIN SODIUM-DEXTROSE 2-4 GM/100ML-% IV SOLN
INTRAVENOUS | Status: AC
  Filled 2021-11-17: qty 100

## 2021-11-17 MED ORDER — HEPARIN SOD (PORK) LOCK FLUSH 100 UNIT/ML IV SOLN
INTRAVENOUS | Status: DC | PRN
  Administered 2021-11-17: 500 [IU] via INTRAVENOUS

## 2021-11-17 MED ORDER — CEFAZOLIN SODIUM-DEXTROSE 2-4 GM/100ML-% IV SOLN
2.0000 g | INTRAVENOUS | Status: AC
  Administered 2021-11-17: 2 g via INTRAVENOUS

## 2021-11-17 MED ORDER — ACETAMINOPHEN 500 MG PO TABS: 1000.0000 mg | ORAL_TABLET | ORAL | Status: AC

## 2021-11-17 MED ORDER — BUPIVACAINE-EPINEPHRINE (PF) 0.25% -1:200000 IJ SOLN
INTRAMUSCULAR | Status: AC
  Filled 2021-11-17: qty 30

## 2021-11-17 MED ORDER — CHLORHEXIDINE GLUCONATE 0.12 % MT SOLN: 15.0000 mL | Freq: Once | OROMUCOSAL | Status: AC

## 2021-11-17 MED ORDER — HEPARIN SOD (PORK) LOCK FLUSH 100 UNIT/ML IV SOLN
INTRAVENOUS | Status: AC
  Filled 2021-11-17: qty 5

## 2021-11-17 MED ORDER — PHENYLEPHRINE 80 MCG/ML (10ML) SYRINGE FOR IV PUSH (FOR BLOOD PRESSURE SUPPORT)
PREFILLED_SYRINGE | INTRAVENOUS | Status: AC
  Filled 2021-11-17: qty 10

## 2021-11-17 MED ORDER — LIDOCAINE HCL (PF) 1 % IJ SOLN
INTRAMUSCULAR | Status: DC | PRN
  Administered 2021-11-17: 10 mL

## 2021-11-17 MED ORDER — LIDOCAINE 2% (20 MG/ML) 5 ML SYRINGE
INTRAMUSCULAR | Status: AC
  Filled 2021-11-17: qty 5

## 2021-11-17 MED ORDER — OXYCODONE HCL 5 MG PO TABS: 5.0000 mg | ORAL_TABLET | Freq: Once | ORAL | Status: DC | PRN

## 2021-11-17 MED ORDER — ONDANSETRON HCL 4 MG/2ML IJ SOLN
INTRAMUSCULAR | Status: DC | PRN
  Administered 2021-11-17: 4 mg via INTRAVENOUS

## 2021-11-17 MED ORDER — LACTATED RINGERS IV SOLN: INTRAVENOUS | Status: DC

## 2021-11-17 MED ORDER — PROPOFOL 10 MG/ML IV BOLUS
INTRAVENOUS | Status: DC | PRN
  Administered 2021-11-17: 100 mg via INTRAVENOUS
  Administered 2021-11-17: 30 mg via INTRAVENOUS
  Administered 2021-11-17: 20 mg via INTRAVENOUS

## 2021-11-17 MED ORDER — 0.9 % SODIUM CHLORIDE (POUR BTL) OPTIME
TOPICAL | Status: DC | PRN
  Administered 2021-11-17: 1000 mL

## 2021-11-17 SURGICAL SUPPLY — 39 items
BAG DECANTER FOR FLEXI CONT (MISCELLANEOUS) ×1 IMPLANT
CANISTER SUCT 3000ML PPV (MISCELLANEOUS) IMPLANT
CHLORAPREP W/TINT 26 (MISCELLANEOUS) ×1 IMPLANT
COVER SURGICAL LIGHT HANDLE (MISCELLANEOUS) ×1 IMPLANT
COVER TRANSDUCER ULTRASND GEL (DISPOSABLE) IMPLANT
DERMABOND ADVANCED (GAUZE/BANDAGES/DRESSINGS) ×1
DERMABOND ADVANCED .7 DNX12 (GAUZE/BANDAGES/DRESSINGS) ×1 IMPLANT
DRAPE C-ARM 42X120 X-RAY (DRAPES) ×1 IMPLANT
DRAPE CHEST BREAST 15X10 FENES (DRAPES) ×1 IMPLANT
DRAPE WARM FLUID 44X44 (DRAPES) IMPLANT
ELECT COATED BLADE 2.86 ST (ELECTRODE) ×1 IMPLANT
ELECT REM PT RETURN 9FT ADLT (ELECTROSURGICAL) ×1
ELECTRODE REM PT RTRN 9FT ADLT (ELECTROSURGICAL) ×1 IMPLANT
GAUZE 4X4 16PLY ~~LOC~~+RFID DBL (SPONGE) ×1 IMPLANT
GEL ULTRASOUND 20GR AQUASONIC (MISCELLANEOUS) IMPLANT
GLOVE BIO SURGEON STRL SZ 6 (GLOVE) ×1 IMPLANT
GLOVE INDICATOR 6.5 STRL GRN (GLOVE) ×1 IMPLANT
GOWN STRL REUS W/ TWL LRG LVL3 (GOWN DISPOSABLE) ×1 IMPLANT
GOWN STRL REUS W/TWL 2XL LVL3 (GOWN DISPOSABLE) ×1 IMPLANT
GOWN STRL REUS W/TWL LRG LVL3 (GOWN DISPOSABLE) ×1
KIT BASIN OR (CUSTOM PROCEDURE TRAY) ×1 IMPLANT
KIT PORT POWER 8FR ISP CVUE (Port) IMPLANT
KIT TURNOVER KIT B (KITS) ×1 IMPLANT
NEEDLE 22X1 1/2 (OR ONLY) (NEEDLE) ×1 IMPLANT
NS IRRIG 1000ML POUR BTL (IV SOLUTION) ×1 IMPLANT
PAD ARMBOARD 7.5X6 YLW CONV (MISCELLANEOUS) ×1 IMPLANT
PENCIL BUTTON HOLSTER BLD 10FT (ELECTRODE) ×1 IMPLANT
POSITIONER HEAD DONUT 9IN (MISCELLANEOUS) ×1 IMPLANT
SPIKE FLUID TRANSFER (MISCELLANEOUS) ×2 IMPLANT
SUT MON AB 4-0 PC3 18 (SUTURE) ×1 IMPLANT
SUT PROLENE 2 0 SH DA (SUTURE) ×2 IMPLANT
SUT VIC AB 3-0 SH 27 (SUTURE) ×1
SUT VIC AB 3-0 SH 27X BRD (SUTURE) ×1 IMPLANT
SYR 5ML LUER SLIP (SYRINGE) ×1 IMPLANT
TOWEL GREEN STERILE (TOWEL DISPOSABLE) ×1 IMPLANT
TOWEL GREEN STERILE FF (TOWEL DISPOSABLE) ×1 IMPLANT
TRAY LAPAROSCOPIC MC (CUSTOM PROCEDURE TRAY) ×1 IMPLANT
TUBE CONNECTING 12X1/4 (SUCTIONS) IMPLANT
YANKAUER SUCT BULB TIP NO VENT (SUCTIONS) IMPLANT

## 2021-11-17 NOTE — Anesthesia Preprocedure Evaluation (Signed)
Anesthesia Evaluation  Patient identified by MRN, date of birth, ID band Patient awake    Reviewed: Allergy & Precautions, H&P , NPO status , Patient's Chart, lab work & pertinent test results  Airway Mallampati: II   Neck ROM: full    Dental   Pulmonary former smoker,    breath sounds clear to auscultation       Cardiovascular hypertension,  Rhythm:regular Rate:Normal     Neuro/Psych TIACVA    GI/Hepatic GERD  ,Duodenal CA   Endo/Other    Renal/GU      Musculoskeletal  (+) Arthritis ,   Abdominal   Peds  Hematology   Anesthesia Other Findings   Reproductive/Obstetrics                             Anesthesia Physical Anesthesia Plan  ASA: 3  Anesthesia Plan: General   Post-op Pain Management:    Induction: Intravenous  PONV Risk Score and Plan: 2 and Ondansetron, Dexamethasone, Midazolam and Treatment may vary due to age or medical condition  Airway Management Planned: LMA  Additional Equipment:   Intra-op Plan:   Post-operative Plan: Extubation in OR  Informed Consent: I have reviewed the patients History and Physical, chart, labs and discussed the procedure including the risks, benefits and alternatives for the proposed anesthesia with the patient or authorized representative who has indicated his/her understanding and acceptance.     Dental advisory given  Plan Discussed with: CRNA, Anesthesiologist and Surgeon  Anesthesia Plan Comments:         Anesthesia Quick Evaluation

## 2021-11-17 NOTE — Anesthesia Postprocedure Evaluation (Signed)
Anesthesia Post Note  Patient: Aaron Moss  Procedure(s) Performed: PORT PLACEMENT (Left: Chest)     Patient location during evaluation: PACU Anesthesia Type: General Level of consciousness: awake and alert, oriented and patient cooperative Pain management: pain level controlled Vital Signs Assessment: post-procedure vital signs reviewed and stable Respiratory status: spontaneous breathing, nonlabored ventilation and respiratory function stable Cardiovascular status: blood pressure returned to baseline and stable Postop Assessment: no apparent nausea or vomiting Anesthetic complications: no   No notable events documented.  Last Vitals:  Vitals:   11/17/21 1600 11/17/21 1615  BP: (!) 142/64 (!) 154/68  Pulse: 69 (!) 56  Resp: 14 12  Temp: (!) 36.1 C   SpO2: 98% 98%    Last Pain:  Vitals:   11/17/21 1600  TempSrc:   PainSc: 0-No pain                 Pervis Hocking

## 2021-11-17 NOTE — Discharge Instructions (Addendum)
[facility] Office Phone Number [phone]   POST OP INSTRUCTIONS  Always review your discharge instruction sheet given to you by the facility where your surgery was performed.  IF YOU HAVE DISABILITY OR FAMILY LEAVE FORMS, YOU MUST BRING THEM TO THE OFFICE FOR PROCESSING.  DO NOT GIVE THEM TO YOUR DOCTOR.  Take 2 tylenol (acetominophen) three times a day for 3 days.  If you still have pain, add ibuprofen with food in between if able to take this (if you have kidney issues or stomach issues, do not take ibuprofen).  If both of those are not enough, add the narcotic pain pill.  If you find you are needing a lot of this overnight after surgery, call the next morning for a refill.   Take your usually prescribed medications unless otherwise directed If you need a refill on your pain medication, please contact your pharmacy.  They will contact our office to request authorization.  Prescriptions will not be filled after 5pm or on week-ends. You should eat very light the first 24 hours after surgery, such as soup, crackers, pudding, etc.  Resume your normal diet the day after surgery It is common to experience some constipation if taking pain medication after surgery.  Increasing fluid intake and taking a stool softener will usually help or prevent this problem from occurring.  A mild laxative (Milk of Magnesia or Miralax) should be taken according to package directions if there are no bowel movements after 48 hours. You may shower in 48 hours.  The surgical glue will flake off in 2-3 weeks.   ACTIVITIES:  No strenuous activity or heavy lifting for 1 week.   You may drive when you no longer are taking prescription pain medication, you can comfortably wear a seatbelt, and you can safely maneuver your car and apply brakes. RETURN TO WORK:  __________n/a_______________ You should see your doctor in the office for a follow-up appointment approximately three-four weeks after your surgery.     WHEN TO CALL YOUR DOCTOR: Fever over 101.0 Nausea and/or vomiting. Extreme swelling or bruising. Continued bleeding from incision. Increased pain, redness, or drainage from the incision.  The clinic staff is available to answer your questions during regular business hours.  Please don't hesitate to call and ask to speak to one of the nurses for clinical concerns.  If you have a medical emergency, go to the nearest emergency room or call 911.  A surgeon from [facility] is always on call at the hospital.  For further questions, please visit [email or web address]   

## 2021-11-17 NOTE — H&P (Signed)
Aaron Moss is an 76 y.o. male.   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  He had a CVA just before whipple had been scheduled. He got a CEA and was on anticoagulation for 3 months.  He received neoadjuvant chemo during this time.  He underwent exploration and was found to have unresectable disease due to involvement of the common hepatic artery/proper hepatic artery.  He received gastrojejunostomy.  He is recovering from this relatively well.  He is set up for chemo next week.    Past Medical History:  Diagnosis Date   Anemia 10/31/2021   Dx IDA   Carotid stenosis, left    Colon cancer ([place]) 2000   Coronary artery disease    per Dr. 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   GERD (gastroesophageal reflux disease)    Glaucoma    History of chemotherapy 2000   colon cancer   History of radiation therapy 08/06/13- 10/03/13   prostate 7800 cGy 40 sessions, seminal vesicles 5600 cGy 40 sessions   Hyperlipidemia    Hypertension    Left knee DJD    Prostate cancer ([place]) 05/23/2013   gleason 3+4=7, volume 11.5 ml   Stroke ([place]) 04/18/2021   TIA (transient ischemic attack)     Past Surgical History:  Procedure Laterality Date   CHOLECYSTECTOMY N/A 10/19/2021   Procedure: CHOLECYSTECTOMY;  Surgeon: [name], MD;  Location: [facility];  Service: General;  Laterality: N/A;   COLON SURGERY  2000   colon cancer   COLONOSCOPY     ENDARTERECTOMY Left 04/25/2021   Procedure: LEFT ENDARTERECTOMY CAROTID;  Surgeon: Marty Heck, MD;  Location: [facility];  Service: Vascular;  Laterality: Left;   EUS     pancreatic cyst   KNEE ARTHROSCOPY  2007   LT- [facility]   LAPAROSCOPY N/A 10/19/2021   Procedure: LAPAROSCOPY DIAGNOSTIC;  Surgeon: [name], MD;  Location: [facility];  Service: General;  Laterality: N/A;  GENERAL AND TAP BLOCK   PATCH ANGIOPLASTY Left 04/25/2021   Procedure: PATCH ANGIOPLASTY  XENOSURE 1CM X 6CM;  Surgeon: Marty Heck, MD;  Location: [facility];  Service: Vascular;  Laterality: Left;   PROSTATE BIOPSY  05/23/13   gleason 7, volume 11.5 ml   WHIPPLE PROCEDURE N/A 10/19/2021   Procedure: ATTEMPTED WHIPPLE PROCEDURE  WITH GASTRODUODENAL BYPASS;  Surgeon: [name], MD;  Location: [facility];  Service: General;  Laterality: N/A;  GENERAL AND TAP BLOCK    Family History  Problem Relation Age of Onset   Cancer Mother        Liver? bladder?, dx. late 48s   Colon cancer Sister 71   Cancer Brother        Lung   Diabetes Brother    Cancer Maternal Aunt        unknown type   Breast cancer Maternal Grandmother        dx. >50   Cancer Daughter 3       colon   Esophageal cancer Neg Hx    Rectal cancer Neg Hx    Stomach cancer Neg Hx    Social History:  reports that he quit smoking about 33 years ago. His smoking use included cigarettes. He has a 22.00 pack-year smoking history. He has never used smokeless tobacco. He reports that he does not currently use alcohol. He reports that he does not use drugs.  Allergies:  Allergies  Allergen Reactions   Lyrica [Pregabalin] Other (See Comments)    "Just made me feel bad"    Medications Prior to Admission  Medication Sig Dispense Refill   amLODipine (NORVASC) 10 MG tablet Take 1 tablet by mouth once daily 90 tablet 3   aspirin EC 81 MG EC tablet Take 1 tablet (81 mg total) by mouth daily. Swallow whole. 30 tablet 11   atorvastatin (LIPITOR) 40 MG tablet Take 1 tablet by mouth once daily 90 tablet 2   benazepril (LOTENSIN) 40 MG tablet Take 1 tablet by mouth once daily 90 tablet 3   carvedilol (COREG) 12.5 MG tablet Take 12.5 mg by mouth 2 (two) times daily.     iron polysaccharides (FERREX 150) 150 MG capsule Take 1 capsule by mouth twice daily 180 capsule 0   pantoprazole (PROTONIX) 40 MG tablet Take 1 tablet (40 mg total) by mouth daily. 90 tablet 3   potassium chloride 20 MEQ TBCR Take 20 mEq by mouth 3 (three)  times daily. 90 tablet 0   traMADol (ULTRAM) 50 MG tablet TAKE 1 TABLET BY MOUTH EVERY 6 HOURS AS NEEDED FOR PAIN 120 tablet 1   Travoprost, BAK Free, (TRAVATAN) 0.004 % SOLN ophthalmic solution Place 1 drop into both eyes at bedtime.     VITAMIN D PO Take 2 tablets by mouth daily. Gummies     lidocaine-prilocaine (EMLA) cream Apply to affected area once 30 g 3   oxyCODONE (OXY IR/ROXICODONE) 5 MG immediate release tablet Take 1 tablet (5 mg total) by mouth every 4 (four) hours as needed for moderate pain. (Patient not taking: Reported on 11/16/2021) 20 tablet 0   prochlorperazine (COMPAZINE) 10 MG tablet Take 1 tablet (10 mg total) by mouth every 6 (six) hours as needed for nausea or vomiting (Use for nausea and / or vomiting unresolved with ondansetron (Zofran).). (Patient not taking: Reported on 11/16/2021) 30 tablet 0    No results found for this or any previous visit (from the past 48 hour(s)). No results found.  Review of Systems  All other systems reviewed and are negative.   Blood pressure (!) 143/62, pulse 68, temperature 97.8 F (36.6 C), temperature source Oral, resp. rate 20, height '5\' 6"'$  (1.676 m), weight 90.7 kg, SpO2 100 %. Physical Exam Vitals reviewed.  Constitutional:      General: He is not in acute distress.    Appearance: Normal appearance. He is not ill-appearing.  HENT:     Head: Normocephalic and atraumatic.     Nose: No congestion or rhinorrhea.     Mouth/Throat:     Mouth: Mucous membranes are moist.  Eyes:     General: No scleral icterus.    Extraocular Movements: Extraocular movements intact.     Conjunctiva/sclera: Conjunctivae normal.     Pupils: Pupils are equal, round, and reactive to light.  Cardiovascular:     Rate and Rhythm: Normal rate and regular rhythm.  Pulmonary:     Effort: Pulmonary effort is normal.  Abdominal:     General: Abdomen is flat.     Comments: Midline incision healing well without evidence of infection  Musculoskeletal:         General: No swelling or deformity.     Cervical back: Neck supple.  Skin:    Capillary Refill: Capillary refill takes 2 to 3 seconds.  Neurological:     General: No focal deficit present.     Mental Status: He is alert and  oriented to person, place, and time.     Motor: No weakness.     Coordination: Coordination normal.  Psychiatric:        Mood and Affect: Mood normal.        Behavior: Behavior normal.        Thought Content: Thought content normal.        Judgment: Judgment normal.      Assessment/Plan Duodenal cancer Need for IV chemotherapy and central access. Plan port placement. Reviewed with patient and wife.   [name], MD 11/17/2021, 2:34 PM

## 2021-11-17 NOTE — Anesthesia Procedure Notes (Signed)
Procedure Name: LMA Insertion Date/Time: 11/17/2021 3:12 PM  Performed by: Dorann Lodge, CRNAPre-anesthesia Checklist: Patient identified, Emergency Drugs available, Suction available and Patient being monitored Patient Re-evaluated:Patient Re-evaluated prior to induction Oxygen Delivery Method: Circle System Utilized Preoxygenation: Pre-oxygenation with 100% oxygen Induction Type: IV induction Ventilation: Mask ventilation without difficulty LMA: LMA inserted LMA Size: 5.0 Number of attempts: 2 Airway Equipment and Method: Bite block Placement Confirmation: positive ETCO2 Tube secured with: Tape Dental Injury: Teeth and Oropharynx as per pre-operative assessment  Comments: LMA 4 placed without adequate seal or Vt. Removed and LMA 5 placed. Good seal, adequate Vt

## 2021-11-17 NOTE — Op Note (Signed)
PREOPERATIVE DIAGNOSIS:  duodenal cancer     POSTOPERATIVE DIAGNOSIS:  Same     PROCEDURE: Left subclavian port placement, Bard ClearVue Power Port, MRI safe, 8-French.      SURGEON:  [name], MD      ANESTHESIA:  General   FINDINGS:  Good venous return, easy flush, and tip of the catheter and   SVC 26.5 cm.      SPECIMEN:  None.      ESTIMATED BLOOD LOSS:  Minimal.      COMPLICATIONS:  None known.      PROCEDURE:  Pt was identified in the holding area and taken to   the operating room, where patient was placed supine on the operating room   table.  General anesthesia was induced.  Patient's arms were tucked and the upper   chest and neck were prepped and draped in sterile fashion.  Time-out was   performed according to the surgical safety check list.  When all was   correct, we continued.   Local anesthetic was administered over this   area at the angle of the clavicle.  The vein was accessed with 1 pass(es) of the needle. There was good venous return and the wire passed easily with no ectopy.   Fluoroscopy was used to confirm that the wire was in the vena cava.      The patient was placed back level and the area for the pocket was anethetized   with local anesthetic.  A 3-cm transverse incision was made with a #15   blade.  Cautery was used to divide the subcutaneous tissues down to the   pectoralis muscle.  An [employer] retractor was used to elevate the skin   while a pocket was created on top of the pectoralis fascia.  The port   was placed into the pocket to confirm that it was of adequate size.  The   catheter was preattached to the port.  The port was then secured to the   pectoralis fascia with four 2-0 Prolene sutures.  These were clamped and   not tied down yet.    The catheter was tunneled through to the wire exit   site.  The catheter was placed along the wire to determine what length it should be to be in the SVC.  The catheter was cut at 26.5 cm.  The  tunneler sheath and dilator were passed over the wire and the dilator and wire were removed.  The catheter was advanced through the tunneler sheath and the tunneler sheath was pulled away.  Care was taken to keep the catheter in the tunneler sheath as this occurred. This was advanced and the tunneler sheath was removed.  There was good venous   return and easy flush of the catheter.  The Prolene sutures were tied   down to the pectoral fascia.  The skin was reapproximated using 3-0   Vicryl interrupted deep dermal sutures.    Fluoroscopy was used to re-confirm good position of the catheter.  The skin   was then closed using 4-0 Monocryl in a subcuticular fashion.  The port was flushed with concentrated heparin flush as well.  The wounds were then cleaned, dried, and dressed with Dermabond.  The patient was awakened from anesthesia and taken to the PACU in stable condition.  Needle, sponge, and instrument counts were correct.               [name], MD

## 2021-11-17 NOTE — Transfer of Care (Signed)
Immediate Anesthesia Transfer of Care Note  Patient: Aaron Moss  Procedure(s) Performed: PORT PLACEMENT (Left: Chest)  Patient Location: PACU  Anesthesia Type:General  Level of Consciousness: awake and alert   Airway & Oxygen Therapy: Patient Spontanous Breathing  Post-op Assessment: Report given to RN and Post -op Vital signs reviewed and stable  Post vital signs: Reviewed and stable  Last Vitals:  Vitals Value Taken Time  BP 142/64 11/17/21 1557  Temp    Pulse 66 11/17/21 1559  Resp 10 11/17/21 1559  SpO2 97 % 11/17/21 1559  Vitals shown include unvalidated device data.  Last Pain:  Vitals:   11/17/21 1145  TempSrc:   PainSc: 0-No pain         Complications: No notable events documented.

## 2021-11-18 ENCOUNTER — Encounter (HOSPITAL_COMMUNITY): Payer: Self-pay | Admitting: General Surgery

## 2021-11-22 ENCOUNTER — Inpatient Hospital Stay: Payer: Medicare Other | Attending: Nurse Practitioner

## 2021-11-22 ENCOUNTER — Telehealth: Payer: Self-pay | Admitting: *Deleted

## 2021-11-22 ENCOUNTER — Other Ambulatory Visit: Payer: Self-pay

## 2021-11-22 DIAGNOSIS — Z7902 Long term (current) use of antithrombotics/antiplatelets: Secondary | ICD-10-CM | POA: Insufficient documentation

## 2021-11-22 DIAGNOSIS — Z923 Personal history of irradiation: Secondary | ICD-10-CM | POA: Insufficient documentation

## 2021-11-22 DIAGNOSIS — C170 Malignant neoplasm of duodenum: Secondary | ICD-10-CM | POA: Insufficient documentation

## 2021-11-22 DIAGNOSIS — Z8673 Personal history of transient ischemic attack (TIA), and cerebral infarction without residual deficits: Secondary | ICD-10-CM | POA: Insufficient documentation

## 2021-11-22 DIAGNOSIS — Z79899 Other long term (current) drug therapy: Secondary | ICD-10-CM | POA: Insufficient documentation

## 2021-11-22 DIAGNOSIS — Z7982 Long term (current) use of aspirin: Secondary | ICD-10-CM | POA: Insufficient documentation

## 2021-11-22 DIAGNOSIS — Z8546 Personal history of malignant neoplasm of prostate: Secondary | ICD-10-CM | POA: Insufficient documentation

## 2021-11-22 DIAGNOSIS — Z5111 Encounter for antineoplastic chemotherapy: Secondary | ICD-10-CM | POA: Insufficient documentation

## 2021-11-22 DIAGNOSIS — D508 Other iron deficiency anemias: Secondary | ICD-10-CM | POA: Insufficient documentation

## 2021-11-22 DIAGNOSIS — I10 Essential (primary) hypertension: Secondary | ICD-10-CM | POA: Insufficient documentation

## 2021-11-22 DIAGNOSIS — E785 Hyperlipidemia, unspecified: Secondary | ICD-10-CM | POA: Insufficient documentation

## 2021-11-22 DIAGNOSIS — E876 Hypokalemia: Secondary | ICD-10-CM | POA: Insufficient documentation

## 2021-11-22 NOTE — Telephone Encounter (Signed)
Notified pt per Dr Burr Medico to continue K+ that he has at home & will check another lab 9/7 & see if he needs to continue.  Pt expressed understanding.

## 2021-11-23 ENCOUNTER — Encounter (HOSPITAL_COMMUNITY): Payer: Self-pay

## 2021-11-23 ENCOUNTER — Ambulatory Visit (HOSPITAL_COMMUNITY)
Admission: RE | Admit: 2021-11-23 | Discharge: 2021-11-23 | Disposition: A | Discharge status: Home / Self Care | Payer: Medicare Other | Source: Ambulatory Visit | Attending: Hematology | Admitting: Hematology

## 2021-11-23 ENCOUNTER — Encounter: Payer: Self-pay | Admitting: Hematology

## 2021-11-23 DIAGNOSIS — C170 Malignant neoplasm of duodenum: Secondary | ICD-10-CM | POA: Diagnosis not present

## 2021-11-23 MED ORDER — SODIUM CHLORIDE (PF) 0.9 % IJ SOLN
INTRAMUSCULAR | Status: AC
  Filled 2021-11-23: qty 50

## 2021-11-23 MED ORDER — IOHEXOL 300 MG/ML  SOLN
100.0000 mL | Freq: Once | INTRAMUSCULAR | Status: AC | PRN
Start: 2021-11-23 — End: 2021-11-23
  Administered 2021-11-23: 100 mL via INTRAVENOUS

## 2021-11-23 MED FILL — Dexamethasone Sodium Phosphate Inj 100 MG/10ML: INTRAMUSCULAR | Qty: 1 | Status: AC

## 2021-11-23 NOTE — Progress Notes (Signed)
Called pt to introduce myself as his Financial Resource Specialist and to discuss the [employer] grant.  I left a msg requesting he return my call if he's interested in applying for the grant.  

## 2021-11-24 ENCOUNTER — Other Ambulatory Visit: Payer: Medicare Other

## 2021-11-24 ENCOUNTER — Inpatient Hospital Stay (HOSPITAL_BASED_OUTPATIENT_CLINIC_OR_DEPARTMENT_OTHER): Payer: Medicare Other | Admitting: Hematology

## 2021-11-24 ENCOUNTER — Inpatient Hospital Stay: Payer: Medicare Other

## 2021-11-24 ENCOUNTER — Other Ambulatory Visit: Payer: Self-pay

## 2021-11-24 VITALS — BP 138/52 | HR 63 | Temp 98.1°F | Resp 18 | Wt 207.0 lb

## 2021-11-24 DIAGNOSIS — C170 Malignant neoplasm of duodenum: Secondary | ICD-10-CM

## 2021-11-24 DIAGNOSIS — Z923 Personal history of irradiation: Secondary | ICD-10-CM | POA: Diagnosis not present

## 2021-11-24 DIAGNOSIS — Z5111 Encounter for antineoplastic chemotherapy: Secondary | ICD-10-CM | POA: Diagnosis present

## 2021-11-24 DIAGNOSIS — Z7902 Long term (current) use of antithrombotics/antiplatelets: Secondary | ICD-10-CM | POA: Diagnosis not present

## 2021-11-24 DIAGNOSIS — I10 Essential (primary) hypertension: Secondary | ICD-10-CM | POA: Diagnosis not present

## 2021-11-24 DIAGNOSIS — Z79899 Other long term (current) drug therapy: Secondary | ICD-10-CM | POA: Diagnosis not present

## 2021-11-24 DIAGNOSIS — Z7982 Long term (current) use of aspirin: Secondary | ICD-10-CM | POA: Diagnosis not present

## 2021-11-24 DIAGNOSIS — Z8546 Personal history of malignant neoplasm of prostate: Secondary | ICD-10-CM | POA: Diagnosis not present

## 2021-11-24 DIAGNOSIS — E785 Hyperlipidemia, unspecified: Secondary | ICD-10-CM | POA: Diagnosis not present

## 2021-11-24 DIAGNOSIS — E876 Hypokalemia: Secondary | ICD-10-CM | POA: Diagnosis not present

## 2021-11-24 DIAGNOSIS — D508 Other iron deficiency anemias: Secondary | ICD-10-CM | POA: Diagnosis present

## 2021-11-24 DIAGNOSIS — Z8673 Personal history of transient ischemic attack (TIA), and cerebral infarction without residual deficits: Secondary | ICD-10-CM | POA: Diagnosis not present

## 2021-11-24 LAB — CBC WITH DIFFERENTIAL (CANCER CENTER ONLY)
Abs Immature Granulocytes: 0.02 10*3/uL (ref 0.00–0.07)
Basophils Absolute: 0 10*3/uL (ref 0.0–0.1)
Basophils Relative: 0 %
Eosinophils Absolute: 0.4 10*3/uL (ref 0.0–0.5)
Eosinophils Relative: 6 %
HCT: 26.8 % — ABNORMAL LOW (ref 39.0–52.0)
Hemoglobin: 8.4 g/dL — ABNORMAL LOW (ref 13.0–17.0)
Immature Granulocytes: 0 %
Lymphocytes Relative: 18 %
Lymphs Abs: 1.1 10*3/uL (ref 0.7–4.0)
MCH: 24.2 pg — ABNORMAL LOW (ref 26.0–34.0)
MCHC: 31.3 g/dL (ref 30.0–36.0)
MCV: 77.2 fL — ABNORMAL LOW (ref 80.0–100.0)
Monocytes Absolute: 0.5 10*3/uL (ref 0.1–1.0)
Monocytes Relative: 9 %
Neutro Abs: 4.2 10*3/uL (ref 1.7–7.7)
Neutrophils Relative %: 67 %
Platelet Count: 211 10*3/uL (ref 150–400)
RBC: 3.47 MIL/uL — ABNORMAL LOW (ref 4.22–5.81)
RDW: 21.9 % — ABNORMAL HIGH (ref 11.5–15.5)
WBC Count: 6.3 10*3/uL (ref 4.0–10.5)
nRBC: 0 % (ref 0.0–0.2)

## 2021-11-24 LAB — CMP (CANCER CENTER ONLY)
ALT: 143 U/L — ABNORMAL HIGH (ref 0–44)
AST: 62 U/L — ABNORMAL HIGH (ref 15–41)
Albumin: 3 g/dL — ABNORMAL LOW (ref 3.5–5.0)
Alkaline Phosphatase: 273 U/L — ABNORMAL HIGH (ref 38–126)
Anion gap: 4 — ABNORMAL LOW (ref 5–15)
BUN: 15 mg/dL (ref 8–23)
CO2: 27 mmol/L (ref 22–32)
Calcium: 8.4 mg/dL — ABNORMAL LOW (ref 8.9–10.3)
Chloride: 111 mmol/L (ref 98–111)
Creatinine: 1.01 mg/dL (ref 0.61–1.24)
GFR, Estimated: >60 mL/min (ref >60)
Glucose, Bld: 106 mg/dL — ABNORMAL HIGH (ref 70–99)
Potassium: 4 mmol/L (ref 3.5–5.1)
Sodium: 142 mmol/L (ref 135–145)
Total Bilirubin: 0.8 mg/dL (ref 0.3–1.2)
Total Protein: 5.7 g/dL — ABNORMAL LOW (ref 6.5–8.1)

## 2021-11-24 LAB — IRON AND IRON BINDING CAPACITY (CC-WL,HP ONLY)
Iron: 26 ug/dL — ABNORMAL LOW (ref 45–182)
Saturation Ratios: 12 % — ABNORMAL LOW (ref 17.9–39.5)
TIBC: 214 ug/dL — ABNORMAL LOW (ref 250–450)
UIBC: 188 ug/dL (ref 117–376)

## 2021-11-24 LAB — TSH: TSH: 1.547 u[IU]/mL (ref 0.350–4.500)

## 2021-11-24 LAB — FERRITIN: Ferritin: 14 ng/mL — ABNORMAL LOW (ref 24–336)

## 2021-11-24 MED ORDER — PALONOSETRON HCL INJECTION 0.25 MG/5ML
0.2500 mg | Freq: Once | INTRAVENOUS | Status: AC
  Administered 2021-11-24: 0.25 mg via INTRAVENOUS
  Filled 2021-11-24: qty 5

## 2021-11-24 MED ORDER — SODIUM CHLORIDE 0.9 % IV SOLN
2375.0000 mg/m2 | INTRAVENOUS | Status: DC
  Administered 2021-11-24: 5000 mg via INTRAVENOUS
  Filled 2021-11-24: qty 100

## 2021-11-24 MED ORDER — SODIUM CHLORIDE 0.9% FLUSH
10.0000 mL | Freq: Once | INTRAVENOUS | Status: AC
  Administered 2021-11-24: 10 mL via INTRAVENOUS

## 2021-11-24 MED ORDER — SODIUM CHLORIDE 0.9 % IV SOLN
10.0000 mg | Freq: Once | INTRAVENOUS | Status: AC
  Administered 2021-11-24: 10 mg via INTRAVENOUS
  Filled 2021-11-24: qty 10

## 2021-11-24 MED ORDER — OXALIPLATIN CHEMO INJECTION 100 MG/20ML
85.0000 mg/m2 | Freq: Once | INTRAVENOUS | Status: AC
  Administered 2021-11-24: 180 mg via INTRAVENOUS
  Filled 2021-11-24: qty 36

## 2021-11-24 MED ORDER — LEUCOVORIN CALCIUM INJECTION 350 MG
400.0000 mg/m2 | Freq: Once | INTRAVENOUS | Status: AC
  Administered 2021-11-24: 844 mg via INTRAVENOUS
  Filled 2021-11-24: qty 42.2

## 2021-11-24 MED ORDER — DEXTROSE 5 % IV SOLN: Freq: Once | INTRAVENOUS | Status: AC

## 2021-11-24 NOTE — Patient Instructions (Signed)
[facility]  Discharge Instructions: Thank you for choosing [facility] to provide your oncology and hematology care.   If you have a lab appointment with the Cancer Center, please go directly to the Cancer Center and check in at the registration area.   Wear comfortable clothing and clothing appropriate for easy access to any Portacath or PICC line.   We strive to give you quality time with your provider. You may need to reschedule your appointment if you arrive late (15 or more minutes).  Arriving late affects you and other patients whose appointments are after yours.  Also, if you miss three or more appointments without notifying the office, you may be dismissed from the clinic at the provider's discretion.      For prescription refill requests, have your pharmacy contact our office and allow 72 hours for refills to be completed.    Today you received the following chemotherapy and/or immunotherapy agents: oxaliplatin, leucovorin, fluorouracil      To help prevent nausea and vomiting after your treatment, we encourage you to take your nausea medication as directed.  BELOW ARE SYMPTOMS THAT SHOULD BE REPORTED IMMEDIATELY: *FEVER GREATER THAN 100.4 F (38 C) OR HIGHER *CHILLS OR SWEATING *NAUSEA AND VOMITING THAT IS NOT CONTROLLED WITH YOUR NAUSEA MEDICATION *UNUSUAL SHORTNESS OF BREATH *UNUSUAL BRUISING OR BLEEDING *URINARY PROBLEMS (pain or burning when urinating, or frequent urination) *BOWEL PROBLEMS (unusual diarrhea, constipation, pain near the anus) TENDERNESS IN MOUTH AND THROAT WITH OR WITHOUT PRESENCE OF ULCERS (sore throat, sores in mouth, or a toothache) UNUSUAL RASH, SWELLING OR PAIN  UNUSUAL VAGINAL DISCHARGE OR ITCHING   Items with * indicate a potential emergency and should be followed up as soon as possible or go to the Emergency Department if any problems should occur.  Please show the CHEMOTHERAPY ALERT CARD or  IMMUNOTHERAPY ALERT CARD at check-in to the Emergency Department and triage nurse.  Should you have questions after your visit or need to cancel or reschedule your appointment, please contact [facility]  Dept: [phone]  and follow the prompts.  Office hours are 8:00 a.m. to 4:30 p.m. Monday - Friday. Please note that voicemails left after 4:00 p.m. may not be returned until the following business day.  We are closed weekends and major holidays. You have access to a nurse at all times for urgent questions. Please call the main number to the clinic Dept: [phone] and follow the prompts.   For any non-urgent questions, you may also contact your provider using MyChart. We now offer e-Visits for anyone 18 and older to request care online for non-urgent symptoms. For details visit [email or web address].   Also download the MyChart app! Go to the app store, search "MyChart", open the app, select [facility], and log in with your MyChart username and password.  Masks are optional in the cancer centers. If you would like for your care team to wear a mask while they are taking care of you, please let them know. You may have one support person who is at least 78 years old accompany you for your appointments. 

## 2021-11-24 NOTE — Progress Notes (Signed)
Per Dr. Burr Medico, [facility] to treat with elevated AST/ALT

## 2021-11-24 NOTE — Progress Notes (Signed)
[place]   [phone] Fax:([phone]   Clinic Follow up Note   Patient Care Team: Biagio Borg, MD as PCP - General Stanford Breed, Denice Bors, MD as PCP - Cardiology (Cardiology) [name], MD as Consulting Physician (Gastroenterology) [name], MD as Consulting Physician (Orthopedic Surgery) [name], MD as Consulting Physician (Ophthalmology) [name], MD as Consulting Physician (Oncology) Royston Bake, RN as Oncology Nurse Navigator (Oncology)  Date of Service:  11/24/2021  CHIEF COMPLAINT: f/u of duodenal cancer  CURRENT THERAPY:  FOLFOX, q14d, starting 11/24/21  ASSESSMENT & PLAN:  Aaron Moss is a 76 y.o. male with   1. 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  Although the scope could not pass patient has no signs of obstruction. Biopsy confirmed moderate-poorly differentiated adenocarcinoma -The surgery was delayed due to his stroke on 04/18/21 -he received 8 cycles neoadjuvant Keytruda 05/11/21 - 09/28/21. He has tolerated well with mild fatigue.  -he was taken for Whipple surgery on 10/19/21. However, during the procedure, Dr. Barry Dienes found that the tumor was unable to be resected due to obscuring of hepatic artery.  He underwent gastric bypass surgery due to the pending obstruction. -restaging CT CAP 11/23/21 showed: mild improvement to upper abdominal adenopathy; no definite local progression or distant metastatic disease. -he had port placed 11/17/21 in anticipation of beginning FOLFOX today, 11/24/21. -Repeated CT scan yesterday showed overall stable disease, I reviewed with patient and give him a copy of the report. -labs reviewed, anemia is slightly worse with hgb at 8.4, liver enzymes are slightly elevated. Adequate to proceed with first FOLFOX  today.  I reviewed potential side effect and management with him and his wife, he voiced good understanding and agrees to proceed.   2. Lynch Syndrome, H/o prostate (2015) and colon (2000) cancers -he has a personal history of stage pT3N0 colon cancer in 2000 and prostate cancer dx in 2015, s/p 8 weeks of IMRT with Dr. Valere Dross. -genetic testing and counseling on 04/07/21 confirmed a PMS2 mutation, as well as VUS in ATM, BLM, and SDHA.   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 -he is on oral iron.   4. Comorbidities: TIA, s/p left endarterectomy carotid on 04/25/21; Hypokalemia; HTN -TIA on 04/18/21, on aspirin and Plavix, no residual neurodeficits -he has chronic low potassium, on KCL.     Plan -proceed with first FOLFOX today as scheduled -lab, flush, f/u, and FOLFOX in 2 weeks   No problem-specific Assessment & Plan notes found for this encounter.   SUMMARY OF ONCOLOGIC HISTORY: Oncology History  Adenocarcinoma of duodenum ([place])  03/01/2021 Imaging   EXAM: ABDOMEN ULTRASOUND COMPLETE  IMPRESSION: 1. Simple appearing right renal cyst. 2. Otherwise negative abdominal ultrasound   03/18/2021 Procedure   Upper Endoscopy/Colonoscopy, Dr. Loletha Carrow  Upper Endoscopy Impression: - Normal esophagus. - A single submucosal papule (nodule) found in the stomach. - Duodenal mass. Biopsied. Concerning for malignancy.  Colonoscopy Impression: - Diverticulosis in the entire examined colon. - The examined portion of the ileum was normal. - Patent end-to-end colo-colonic anastomosis, characterized by healthy appearing mucosa. - Internal hemorrhoids. - No specimens collected.   03/18/2021 Initial Biopsy   Diagnosis Duodenum, Biopsy - INVASIVE MODERATE TO POORLY  DIFFERENTIATED ADENOCARCINOMA   03/18/2021 Cancer Staging   Staging form: Small Intestine - Adenocarcinoma, AJCC 8th Edition - Clinical  stage from 03/18/2021: Stage IIIA (cTX, cN1, cM0) - Signed by [name], MD on 06/22/2021 Stage prefix: Initial diagnosis   03/22/2021 Initial Diagnosis   Adenocarcinoma of duodenum ([place])   04/01/2021 Imaging   EXAM: CT CHEST, ABDOMEN, AND PELVIS WITH CONTRAST  IMPRESSION: 1. Circumferential wall thickening of the first portion of the duodenum, in keeping with known primary malignancy. 2. Enlarged, hypodense lymph nodes anterior to the pancreatic head and in the portacaval station, concerning for nodal metastatic disease. 3. No other evidence of metastatic disease in the chest, abdomen, or pelvis. 4. Incidental note of a fluid attenuation lesion within the proximal pancreatic body measuring 1.9 x 1.4 cm, with prominence of the pancreatic duct distally, up to 0.6 cm. This lesion was present on remote prior examination dated 05/04/2009, and has slightly increased in size over a long period of time. This is consistent with a small IPMN and benign given indolent behavior over greater than 10 years. 5. Background of very fine centrilobular pulmonary nodules, most concentrated in the lung apices, most commonly seen in smoking-related respiratory bronchiolitis. 6. Coronary artery disease.   Aortic Atherosclerosis (ICD10-I70.0).    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 Of note, a variant of uncertain significance was detected in the ATM (p.A869G), BLM (c.800-3T>G), and SDHA (p.V632F) genes. Report date is 04/27/2021.  The CancerNext-Expanded gene panel offered by [facility] and includes sequencing, rearrangement, and RNA analysis for the following 77 genes: AIP, ALK, APC, ATM, AXIN2, BAP1, BARD1, BLM, BMPR1A, BRCA1, BRCA2, BRIP1, CDC73, CDH1, CDK4, CDKN1B, CDKN2A, CHEK2, CTNNA1, DICER1, FANCC, FH, FLCN, GALNT12, KIF1B, LZTR1, MAX, MEN1, MET, MLH1, MSH2, MSH3, MSH6, MUTYH, NBN, NF1,  NF2, NTHL1, PALB2, PHOX2B, PMS2, POT1, PRKAR1A, PTCH1, PTEN, RAD51C, RAD51D, RB1, RECQL, RET, SDHA, SDHAF2, SDHB, SDHC, SDHD, SMAD4, SMARCA4, SMARCB1, SMARCE1, STK11, SUFU, TMEM127, TP53, TSC1, TSC2, VHL and XRCC2 (sequencing and deletion/duplication); EGFR, EGLN1, HOXB13, KIT, MITF, PDGFRA, POLD1, and POLE (sequencing only); EPCAM and GREM1 (deletion/duplication only).    05/11/2021 - 09/28/2021 Chemotherapy   Patient is on Treatment Plan : COLORECTAL Pembrolizumab (200) q21d     11/24/2021 -  Chemotherapy   Patient is on Treatment Plan : COLORECTAL FOLFOX q14d x 4 months        INTERVAL HISTORY:  Aaron Moss is here for a follow up of duodenal cancer. He was last seen by me on 11/09/21 in consultation. He was seen in the infusion area. He reports he is stable overall. He denies nausea or abdominal pain. They report his appetite is up and down.   All other systems were reviewed with the patient and are negative.  MEDICAL HISTORY:  Past Medical History:  Diagnosis Date   Anemia 10/31/2021   Dx IDA   Carotid stenosis, left    Colon cancer ([place]) 2000   Coronary artery disease    per Dr. Jacalyn Lefevre note from 04/13/21   Diverticulosis of colon    Duodenal cancer ([place]) 01/2021   GERD (gastroesophageal reflux disease)    Glaucoma    History of chemotherapy 2000   colon cancer   History of radiation therapy 08/06/13- 10/03/13   prostate 7800 cGy 40 sessions, seminal vesicles 5600 cGy 40 sessions   Hyperlipidemia    Hypertension    Left knee DJD  Prostate cancer ([place]) 05/23/2013   gleason 3+4=7, volume 11.5 ml   Stroke ([place]) 04/18/2021   TIA (transient ischemic attack)     SURGICAL HISTORY: Past Surgical History:  Procedure Laterality Date   CHOLECYSTECTOMY N/A 10/19/2021   Procedure: CHOLECYSTECTOMY;  Surgeon: [name], MD;  Location: [place] [facility];  Service: General;  Laterality: N/A;   COLON SURGERY  2000   colon cancer   COLONOSCOPY     ENDARTERECTOMY Left 04/25/2021    Procedure: LEFT ENDARTERECTOMY CAROTID;  Surgeon: Marty Heck, MD;  Location: [facility];  Service: Vascular;  Laterality: Left;   EUS     pancreatic cyst   KNEE ARTHROSCOPY  2007   LT- [facility]   LAPAROSCOPY N/A 10/19/2021   Procedure: LAPAROSCOPY DIAGNOSTIC;  Surgeon: [name], MD;  Location: [facility];  Service: General;  Laterality: N/A;  GENERAL AND TAP BLOCK   PATCH ANGIOPLASTY Left 04/25/2021   Procedure: PATCH ANGIOPLASTY XENOSURE 1CM X 6CM;  Surgeon: Marty Heck, MD;  Location: [facility];  Service: Vascular;  Laterality: Left;   PORTACATH PLACEMENT Left 11/17/2021   Procedure: PORT PLACEMENT;  Surgeon: [name], MD;  Location: [place];  Service: General;  Laterality: Left;   PROSTATE BIOPSY  05/23/13   gleason 7, volume 11.5 ml   WHIPPLE PROCEDURE N/A 10/19/2021   Procedure: ATTEMPTED WHIPPLE PROCEDURE  WITH GASTRODUODENAL BYPASS;  Surgeon: [name], MD;  Location: [place];  Service: General;  Laterality: N/A;  GENERAL AND TAP BLOCK    I have reviewed the social history and family history with the patient and they are unchanged from previous note.  ALLERGIES:  is allergic to lyrica [pregabalin].  MEDICATIONS:  Current Outpatient Medications  Medication Sig Dispense Refill   amLODipine (NORVASC) 10 MG tablet Take 1 tablet by mouth once daily 90 tablet 3   aspirin EC 81 MG EC tablet Take 1 tablet (81 mg total) by mouth daily. Swallow whole. 30 tablet 11   atorvastatin (LIPITOR) 40 MG tablet Take 1 tablet by mouth once daily 90 tablet 2   benazepril (LOTENSIN) 40 MG tablet Take 1 tablet by mouth once daily 90 tablet 3   carvedilol (COREG) 12.5 MG tablet Take 12.5 mg by mouth 2 (two) times daily.     iron polysaccharides (FERREX 150) 150 MG capsule Take 1 capsule by mouth twice daily 180 capsule 0   lidocaine-prilocaine (EMLA) cream Apply to affected area once 30 g 3   oxyCODONE (OXY IR/ROXICODONE) 5 MG immediate release tablet Take 1 tablet (5 mg total) by mouth every 4  (four) hours as needed for moderate pain. (Patient not taking: Reported on 11/16/2021) 20 tablet 0   oxyCODONE (OXY IR/ROXICODONE) 5 MG immediate release tablet Take 1 tablet (5 mg total) by mouth every 6 (six) hours as needed for severe pain. 5 tablet 0   pantoprazole (PROTONIX) 40 MG tablet Take 1 tablet (40 mg total) by mouth daily. 90 tablet 3   potassium chloride 20 MEQ TBCR Take 20 mEq by mouth 3 (three) times daily. 90 tablet 0   prochlorperazine (COMPAZINE) 10 MG tablet Take 1 tablet (10 mg total) by mouth every 6 (six) hours as needed for nausea or vomiting (Use for nausea and / or vomiting unresolved with ondansetron (Zofran).). (Patient not taking: Reported on 11/16/2021) 30 tablet 0   traMADol (ULTRAM) 50 MG tablet TAKE 1 TABLET BY MOUTH EVERY 6 HOURS AS NEEDED FOR PAIN 120 tablet 1   Travoprost, BAK Free, (TRAVATAN)  0.004 % SOLN ophthalmic solution Place 1 drop into both eyes at bedtime.     VITAMIN D PO Take 2 tablets by mouth daily. Gummies     No current facility-administered medications for this visit.   Facility-Administered Medications Ordered in Other Visits  Medication Dose Route Frequency Provider Last Rate Last Admin   fluorouracil (ADRUCIL) 5,000 mg in sodium chloride 0.9 % 150 mL chemo infusion  2,375 mg/m2 (Treatment Plan Recorded) Intravenous 1 day or 1 dose [name], MD   Infusion Verify at 11/24/21 1600    PHYSICAL EXAMINATION: ECOG PERFORMANCE STATUS: 1 - Symptomatic but completely ambulatory  There were no vitals filed for this visit. Wt Readings from Last 3 Encounters:  11/24/21 207 lb (93.9 kg)  11/17/21 200 lb (90.7 kg)  11/09/21 210 lb 6.4 oz (95.4 kg)     GENERAL:alert, no distress and comfortable SKIN: skin color normal, no rashes or significant lesions EYES: normal, Conjunctiva are pink and non-injected, sclera clear  NEURO: alert & oriented x 3 with fluent speech  LABORATORY DATA:  I have reviewed the data as listed    Latest Ref Rng & Units  11/24/2021   11:23 AM 10/31/2021   12:11 PM 10/24/2021    1:21 AM  CBC  WBC 4.0 - 10.5 K/uL 6.3  8.5  7.8   Hemoglobin 13.0 - 17.0 g/dL 8.4  9.5  9.0   Hematocrit 39.0 - 52.0 % 26.8  30.1  28.6   Platelets 150 - 400 K/uL 211  338.0  262         Latest Ref Rng & Units 11/24/2021   11:23 AM 10/31/2021   12:11 PM 10/24/2021    1:21 AM  CMP  Glucose 70 - 99 mg/dL 106  103  81   BUN 8 - 23 mg/dL _0 Creatinine 0.61 - 1.24 mg/dL 1.01  1.22  1.07   Sodium 135 - 145 mmol/L 142  141  142   Potassium 3.5 - 5.1 mmol/L 4.0  4.8  3.2   Chloride 98 - 111 mmol/L 111  109  110   CO2 22 - 32 mmol/L _1 Calcium 8.9 - 10.3 mg/dL 8.4  9.0  8.4   Total Protein 6.5 - 8.1 g/dL 5.7   5.5   Total Bilirubin 0.3 - 1.2 mg/dL 0.8   1.2   Alkaline Phos 38 - 126 U/L 273   72   AST 15 - 41 U/L 62   25   ALT 0 - 44 U/L 143   34       RADIOGRAPHIC STUDIES: I have personally reviewed the radiological images as listed and agreed with the findings in the report. CT CHEST ABDOMEN PELVIS W CONTRAST  Result Date: 11/24/2021 CLINICAL DATA:  History of duodenal adenocarcinoma. Restaging. * Tracking Code: BO * EXAM: CT CHEST, ABDOMEN, AND PELVIS WITH CONTRAST TECHNIQUE: Multidetector CT imaging of the chest, abdomen and pelvis was performed following the standard protocol during bolus administration of intravenous contrast. RADIATION DOSE REDUCTION: This exam was performed according to the departmental dose-optimization program which includes automated exposure control, adjustment of the mA and/or kV according to patient size and/or use of iterative reconstruction technique. CONTRAST:  121m OMNIPAQUE IOHEXOL 300 MG/ML  SOLN COMPARISON:  Abdominopelvic CT 08/02/2021. CT of the chest, abdomen and pelvis 04/01/2021. FINDINGS: CT CHEST FINDINGS Cardiovascular: No acute vascular findings. Left subclavian Port-A-Cath extends to the superior cavoatrial junction.  There is atherosclerosis of the aorta, great vessels and  coronary arteries. The heart size is normal. There is no pericardial effusion. Mediastinum/Nodes: There are no enlarged mediastinal, hilar or axillary lymph nodes. The thyroid gland, trachea and esophagus demonstrate no significant findings. Lungs/Pleura: Trace right pleural effusion. No pneumothorax. Mild central airway thickening. No airspace disease or suspicious pulmonary nodule. Musculoskeletal/Chest wall: No chest wall mass or suspicious osseous findings. Stable simple appearing intramuscular lipoma posterior to the right scapula. CT ABDOMEN AND PELVIS FINDINGS Hepatobiliary: The liver is normal in density without suspicious focal abnormality. No significant biliary dilatation status post cholecystectomy. Pancreas: Small cystic-appearing mass within the pancreatic body is again noted, measuring 1.8 x 1.5 cm on image 57/2. Stable pancreatic ductal dilatation within the pancreatic body and tail to 6 mm. No enhancing pancreatic mass or surrounding inflammation. Spleen: Normal in size without focal abnormality. Adrenals/Urinary Tract: Both adrenal glands appear normal. No evidence of urinary tract calculus, suspicious renal lesion or hydronephrosis. 4.1 cm cyst in the lower pole of the right kidney does not appear significantly changed and requires no specific imaging follow-up. The bladder appears unremarkable for its degree of distention. Stomach/Bowel: Enteric contrast was administered and has passed into the proximal colon. Patient has undergone interval abdominal surgery for attempted Whipple and subsequent gastrojejunostomy. The gastrojejunostomy is patent. There is circumferential wall thickening of the mid stomach which is similar to the previous study. There is also circumferential wall thickening of the distal stomach and proximal duodenum which extends 6.2 cm in length on image 60/2, also similar to the prior study and attributed to the patient's known duodenal malignancy. No resulting bowel  obstruction. The remainder of the small bowel appears unremarkable. The appendix appears normal. Scattered colonic diverticulosis without evidence of acute inflammation Vascular/Lymphatic: Further improvement in the previously demonstrated upper abdominal adenopathy with a portacaval node measuring 2.5 x 1.3 cm on image 59/2 (previously 3.6 x 1.8 cm). Previously demonstrated node anterior to the pancreatic head is not easily discern from the adjacent duodenal mass, but also appears slightly smaller. Small ileocolonic mesenteric lymph nodes are unchanged. No progressive adenopathy identified. Aortic and branch vessel atherosclerosis without evidence of aneurysm or large vessel occlusion. Possible tumor encasement of the common hepatic artery as demonstrated intraoperatively. The portal, superior mesenteric and splenic veins are patent. Reproductive: Unchanged prostatic brachytherapy seeds. No progressive mass lesion identified. Other: As above, interval postsurgical changes in the anterior abdominal wall with mild generalized subcutaneous edema. No ascites or peritoneal nodularity. Musculoskeletal: No acute or significant osseous findings. Diffuse spondylosis with prominent facet hypertrophy in the lower lumbar spine. IMPRESSION: 1. The upper abdominal adenopathy has mildly improved, consistent with response to therapy. 2. Infiltrating mass involving the distal stomach and duodenum has not significantly changed in appearance status post interval attempted Whipple and gastrojejunostomy. No definite local progression or distant metastatic disease identified. 3. 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 4. No significant findings within the chest. 5. Distal colonic diverticulosis. Aortic Atherosclerosis (ICD10-I70.0). Electronically Signed   By: Richardean Sale M.D.   On: 11/24/2021 10:29      Orders  Placed This Encounter  Procedures   CBC with Differential ([facility] Only)    Standing Status:   Future    Standing Expiration Date:   12/23/2022   CMP ([facility] only)    Standing Status:   Future    Standing Expiration Date:   12/23/2022  CBC with Differential ([facility] Only)    Standing Status:   Future    Standing Expiration Date:   01/06/2023   CMP ([place] only)    Standing Status:   Future    Standing Expiration Date:   01/06/2023   All questions were answered. The patient knows to call the clinic with any problems, questions or concerns. No barriers to learning was detected. The total time spent in the appointment was 30 minutes.     [name], MD 11/24/2021   I, Wilburn Mylar, am acting as scribe for [name], MD.   I have reviewed the above documentation for accuracy and completeness, and I agree with the above.

## 2021-11-25 LAB — T4: T4, Total: 6.1 ug/dL (ref 4.5–12.0)

## 2021-11-26 ENCOUNTER — Inpatient Hospital Stay: Payer: Medicare Other

## 2021-11-26 VITALS — BP 117/53 | HR 66 | Temp 98.2°F | Resp 18 | Ht 66.0 in

## 2021-11-26 DIAGNOSIS — C170 Malignant neoplasm of duodenum: Secondary | ICD-10-CM

## 2021-11-26 DIAGNOSIS — Z5111 Encounter for antineoplastic chemotherapy: Secondary | ICD-10-CM | POA: Diagnosis not present

## 2021-11-26 MED ORDER — SODIUM CHLORIDE 0.9% FLUSH
10.0000 mL | INTRAVENOUS | Status: DC | PRN
  Administered 2021-11-26: 10 mL

## 2021-11-26 MED ORDER — HEPARIN SOD (PORK) LOCK FLUSH 100 UNIT/ML IV SOLN
500.0000 [IU] | Freq: Once | INTRAVENOUS | Status: AC | PRN
  Administered 2021-11-26: 500 [IU]

## 2021-12-07 MED FILL — Dexamethasone Sodium Phosphate Inj 100 MG/10ML: INTRAMUSCULAR | Qty: 1 | Status: AC

## 2021-12-08 ENCOUNTER — Inpatient Hospital Stay (HOSPITAL_BASED_OUTPATIENT_CLINIC_OR_DEPARTMENT_OTHER): Payer: Medicare Other | Admitting: Hematology

## 2021-12-08 ENCOUNTER — Inpatient Hospital Stay: Payer: Medicare Other

## 2021-12-08 ENCOUNTER — Other Ambulatory Visit: Payer: Self-pay

## 2021-12-08 ENCOUNTER — Encounter: Payer: Self-pay | Admitting: Hematology

## 2021-12-08 ENCOUNTER — Other Ambulatory Visit: Payer: Medicare Other

## 2021-12-08 VITALS — BP 134/59 | HR 73 | Temp 97.9°F | Resp 16 | Ht 66.0 in | Wt 203.3 lb

## 2021-12-08 DIAGNOSIS — C170 Malignant neoplasm of duodenum: Secondary | ICD-10-CM

## 2021-12-08 DIAGNOSIS — Z5111 Encounter for antineoplastic chemotherapy: Secondary | ICD-10-CM | POA: Diagnosis not present

## 2021-12-08 DIAGNOSIS — D500 Iron deficiency anemia secondary to blood loss (chronic): Secondary | ICD-10-CM

## 2021-12-08 DIAGNOSIS — Z95828 Presence of other vascular implants and grafts: Secondary | ICD-10-CM

## 2021-12-08 LAB — CBC WITH DIFFERENTIAL (CANCER CENTER ONLY)
Abs Immature Granulocytes: 0 10*3/uL (ref 0.00–0.07)
Basophils Absolute: 0 10*3/uL (ref 0.0–0.1)
Basophils Relative: 1 %
Eosinophils Absolute: 0.2 10*3/uL (ref 0.0–0.5)
Eosinophils Relative: 8 %
HCT: 29.5 % — ABNORMAL LOW (ref 39.0–52.0)
Hemoglobin: 9.2 g/dL — ABNORMAL LOW (ref 13.0–17.0)
Immature Granulocytes: 0 %
Lymphocytes Relative: 30 %
Lymphs Abs: 0.9 10*3/uL (ref 0.7–4.0)
MCH: 24.6 pg — ABNORMAL LOW (ref 26.0–34.0)
MCHC: 31.2 g/dL (ref 30.0–36.0)
MCV: 78.9 fL — ABNORMAL LOW (ref 80.0–100.0)
Monocytes Absolute: 0.4 10*3/uL (ref 0.1–1.0)
Monocytes Relative: 14 %
Neutro Abs: 1.5 10*3/uL — ABNORMAL LOW (ref 1.7–7.7)
Neutrophils Relative %: 47 %
Platelet Count: 203 10*3/uL (ref 150–400)
RBC: 3.74 MIL/uL — ABNORMAL LOW (ref 4.22–5.81)
RDW: 21.2 % — ABNORMAL HIGH (ref 11.5–15.5)
WBC Count: 3.2 10*3/uL — ABNORMAL LOW (ref 4.0–10.5)
nRBC: 0 % (ref 0.0–0.2)

## 2021-12-08 LAB — CMP (CANCER CENTER ONLY)
ALT: 25 U/L (ref 0–44)
AST: 13 U/L — ABNORMAL LOW (ref 15–41)
Albumin: 3 g/dL — ABNORMAL LOW (ref 3.5–5.0)
Alkaline Phosphatase: 143 U/L — ABNORMAL HIGH (ref 38–126)
Anion gap: 3 — ABNORMAL LOW (ref 5–15)
BUN: 12 mg/dL (ref 8–23)
CO2: 27 mmol/L (ref 22–32)
Calcium: 8.3 mg/dL — ABNORMAL LOW (ref 8.9–10.3)
Chloride: 114 mmol/L — ABNORMAL HIGH (ref 98–111)
Creatinine: 1 mg/dL (ref 0.61–1.24)
GFR, Estimated: >60 mL/min (ref >60)
Glucose, Bld: 126 mg/dL — ABNORMAL HIGH (ref 70–99)
Potassium: 3.9 mmol/L (ref 3.5–5.1)
Sodium: 144 mmol/L (ref 135–145)
Total Bilirubin: 0.8 mg/dL (ref 0.3–1.2)
Total Protein: 5.5 g/dL — ABNORMAL LOW (ref 6.5–8.1)

## 2021-12-08 MED ORDER — SODIUM CHLORIDE 0.9 % IV SOLN
300.0000 mg | Freq: Once | INTRAVENOUS | Status: AC
  Administered 2021-12-08: 300 mg via INTRAVENOUS
  Filled 2021-12-08: qty 300

## 2021-12-08 MED ORDER — SODIUM CHLORIDE 0.9 % IV SOLN
10.0000 mg | Freq: Once | INTRAVENOUS | Status: AC
  Administered 2021-12-08: 10 mg via INTRAVENOUS
  Filled 2021-12-08: qty 10

## 2021-12-08 MED ORDER — DEXTROSE 5 % IV SOLN: Freq: Once | INTRAVENOUS | Status: AC

## 2021-12-08 MED ORDER — SODIUM CHLORIDE 0.9% FLUSH
10.0000 mL | INTRAVENOUS | Status: AC | PRN
  Administered 2021-12-08: 10 mL

## 2021-12-08 MED ORDER — SODIUM CHLORIDE 0.9 % IV SOLN
2375.0000 mg/m2 | INTRAVENOUS | Status: DC
  Administered 2021-12-08: 5000 mg via INTRAVENOUS
  Filled 2021-12-08: qty 100

## 2021-12-08 MED ORDER — LEUCOVORIN CALCIUM INJECTION 350 MG
400.0000 mg/m2 | Freq: Once | INTRAVENOUS | Status: AC
  Administered 2021-12-08: 844 mg via INTRAVENOUS
  Filled 2021-12-08: qty 42.2

## 2021-12-08 MED ORDER — OXALIPLATIN CHEMO INJECTION 100 MG/20ML
70.0000 mg/m2 | Freq: Once | INTRAVENOUS | Status: AC
  Administered 2021-12-08: 150 mg via INTRAVENOUS
  Filled 2021-12-08: qty 10

## 2021-12-08 MED ORDER — SODIUM CHLORIDE 0.9 % IV SOLN: INTRAVENOUS | Status: DC

## 2021-12-08 MED ORDER — PALONOSETRON HCL INJECTION 0.25 MG/5ML
0.2500 mg | Freq: Once | INTRAVENOUS | Status: AC
  Administered 2021-12-08: 0.25 mg via INTRAVENOUS
  Filled 2021-12-08: qty 5

## 2021-12-08 NOTE — Progress Notes (Signed)
Okay to run pump over 45 hours so he can come in for pump DC on Saturday ay 1:30pm. Patient aware and pump rate set at 5.5 mL/hr per Dr. Burr Medico.

## 2021-12-08 NOTE — Progress Notes (Addendum)
[place]   [phone] Fax:([phone]   Clinic Follow up Note   Patient Care Team: Biagio Borg, MD as PCP - General Stanford Breed, Denice Bors, MD as PCP - Cardiology (Cardiology) [name], MD as Consulting Physician (Gastroenterology) [name], MD as Consulting Physician (Orthopedic Surgery) [name], MD as Consulting Physician (Ophthalmology) [name], MD as Consulting Physician (Oncology) Royston Bake, RN as Oncology Nurse Navigator (Oncology)  Date of Service:  12/08/2021  CHIEF COMPLAINT: f/u of duodenal cancer  CURRENT THERAPY:  FOLFOX, q14d, starting 11/24/21  ASSESSMENT & PLAN:  Aaron Moss is a 76 y.o. male with   1. 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  Although the scope could not pass patient has no signs of obstruction. Biopsy confirmed moderate-poorly differentiated adenocarcinoma -The surgery was delayed due to his stroke on 04/18/21 -he received 8 cycles neoadjuvant Keytruda 05/11/21 - 09/28/21. He has tolerated well with mild fatigue.  -he was taken for Whipple surgery on 10/19/21. However, during the procedure, Dr. Barry Dienes found that the tumor was unable to be resected due to obscuring of hepatic artery.  He underwent gastric bypass surgery due to the pending obstruction. -restaging CT CAP 11/23/21 showed: mild improvement to upper abdominal adenopathy; no definite local progression or distant metastatic disease. -he had port placed 11/17/21 and began FOLFOX on 11/24/21. -labs reviewed, WBC 3.2/ANC 1.5, hgb 9.2. I discussed if his Alleman goes any lower, I would recommend GCF-S injection. Will delay next cycle for a week due to pt being on vacation.   2. Lynch Syndrome, H/o prostate (2015) and colon (2000) cancers -he has a personal history  of stage pT3N0 colon cancer in 2000 and prostate cancer dx in 2015, s/p 8 weeks of IMRT with Dr. Valere Dross. -genetic testing and counseling on 04/07/21 confirmed a PMS2 mutation, as well as VUS in ATM, BLM, and SDHA.   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 -he is on oral iron.   4. Comorbidities: TIA, s/p left endarterectomy carotid on 04/25/21; Hypokalemia; HTN -TIA on 04/18/21, on aspirin and Plavix, no residual neurodeficits -he has chronic low potassium, on KCL.     Plan -proceed with C2 FOLFOX today as scheduled -lab, flush, f/u, and FOLFOX in 3 weeks d/t pt vacation   No problem-specific Assessment & Plan notes found for this encounter.   SUMMARY OF ONCOLOGIC HISTORY: Oncology History  Adenocarcinoma of duodenum ([place])  03/01/2021 Imaging   EXAM: ABDOMEN ULTRASOUND COMPLETE  IMPRESSION: 1. Simple appearing right renal cyst. 2. Otherwise negative abdominal ultrasound   03/18/2021 Procedure   Upper Endoscopy/Colonoscopy, Dr. Loletha Carrow  Upper Endoscopy Impression: - Normal esophagus. - A single submucosal papule (nodule) found in the stomach. - Duodenal mass. Biopsied. Concerning for malignancy.  Colonoscopy Impression: - Diverticulosis in the entire examined colon. - The examined portion of the ileum was normal. - Patent end-to-end colo-colonic anastomosis, characterized by healthy appearing mucosa. - Internal hemorrhoids. - No specimens collected.   03/18/2021 Initial Biopsy   Diagnosis Duodenum, Biopsy - INVASIVE MODERATE TO POORLY DIFFERENTIATED ADENOCARCINOMA   03/18/2021 Cancer Staging   Staging form: Small Intestine - Adenocarcinoma, AJCC 8th Edition - Clinical stage from 03/18/2021: Stage IIIA (cTX, cN1, cM0) - Signed by [name], MD on 06/22/2021 Stage prefix: Initial diagnosis  03/22/2021 Initial Diagnosis   Adenocarcinoma of duodenum ([place])   04/01/2021 Imaging   EXAM: CT CHEST, ABDOMEN, AND PELVIS WITH  CONTRAST  IMPRESSION: 1. Circumferential wall thickening of the first portion of the duodenum, in keeping with known primary malignancy. 2. Enlarged, hypodense lymph nodes anterior to the pancreatic head and in the portacaval station, concerning for nodal metastatic disease. 3. No other evidence of metastatic disease in the chest, abdomen, or pelvis. 4. Incidental note of a fluid attenuation lesion within the proximal pancreatic body measuring 1.9 x 1.4 cm, with prominence of the pancreatic duct distally, up to 0.6 cm. This lesion was present on remote prior examination dated 05/04/2009, and has slightly increased in size over a long period of time. This is consistent with a small IPMN and benign given indolent behavior over greater than 10 years. 5. Background of very fine centrilobular pulmonary nodules, most concentrated in the lung apices, most commonly seen in smoking-related respiratory bronchiolitis. 6. Coronary artery disease.   Aortic Atherosclerosis (ICD10-I70.0).    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 Of note, a variant of uncertain significance was detected in the ATM (p.A869G), BLM (c.800-3T>G), and SDHA (p.V632F) genes. Report date is 04/27/2021.  The CancerNext-Expanded gene panel offered by [facility] and includes sequencing, rearrangement, and RNA analysis for the following 77 genes: AIP, ALK, APC, ATM, AXIN2, BAP1, BARD1, BLM, BMPR1A, BRCA1, BRCA2, BRIP1, CDC73, CDH1, CDK4, CDKN1B, CDKN2A, CHEK2, CTNNA1, DICER1, FANCC, FH, FLCN, GALNT12, KIF1B, LZTR1, MAX, MEN1, MET, MLH1, MSH2, MSH3, MSH6, MUTYH, NBN, NF1, NF2, NTHL1, PALB2, PHOX2B, PMS2, POT1, PRKAR1A, PTCH1, PTEN, RAD51C, RAD51D, RB1, RECQL, RET, SDHA, SDHAF2, SDHB, SDHC, SDHD, SMAD4, SMARCA4, SMARCB1, SMARCE1, STK11, SUFU, TMEM127, TP53, TSC1, TSC2, VHL and XRCC2 (sequencing and deletion/duplication);  EGFR, EGLN1, HOXB13, KIT, MITF, PDGFRA, POLD1, and POLE (sequencing only); EPCAM and GREM1 (deletion/duplication only).    05/11/2021 - 09/28/2021 Chemotherapy   Patient is on Treatment Plan : COLORECTAL Pembrolizumab (200) q21d     11/24/2021 -  Chemotherapy   Patient is on Treatment Plan : COLORECTAL FOLFOX q14d x 4 months        INTERVAL HISTORY:  DRAGAN TAMBURRINO is here for a follow up of duodenal cancer. He was last seen by me on 11/24/21. He presents to the clinic accompanied by his wife. He reports he had fatigue/low energy and low appetite. He endorses drinking one Ensure a day. He reports occasional loose BM.   All other systems were reviewed with the patient and are negative.  MEDICAL HISTORY:  Past Medical History:  Diagnosis Date   Anemia 10/31/2021   Dx IDA   Carotid stenosis, left    Colon cancer ([place]) 2000   Coronary artery disease    per Dr. Jacalyn Lefevre note from 04/13/21   Diverticulosis of colon    Duodenal cancer ([place]) 01/2021   GERD (gastroesophageal reflux disease)    Glaucoma    History of chemotherapy 2000   colon cancer   History of radiation therapy 08/06/13- 10/03/13   prostate 7800 cGy 40 sessions, seminal vesicles 5600 cGy 40 sessions   Hyperlipidemia    Hypertension    Left knee DJD    Prostate cancer ([place]) 05/23/2013   gleason 3+4=7, volume 11.5 ml   Stroke ([place]) 04/18/2021   TIA (transient ischemic attack)     SURGICAL HISTORY: Past Surgical History:  Procedure Laterality Date   CHOLECYSTECTOMY N/A 10/19/2021  Procedure: CHOLECYSTECTOMY;  Surgeon: [name], MD;  Location: [facility] [place];  Service: General;  Laterality: N/A;   COLON SURGERY  2000   colon cancer   COLONOSCOPY     ENDARTERECTOMY Left 04/25/2021   Procedure: LEFT ENDARTERECTOMY CAROTID;  Surgeon: Marty Heck, MD;  Location: [facility];  Service: Vascular;  Laterality: Left;   EUS     pancreatic cyst   KNEE ARTHROSCOPY  2007   LT- [facility]   LAPAROSCOPY N/A 10/19/2021   Procedure:  LAPAROSCOPY DIAGNOSTIC;  Surgeon: [name], MD;  Location: [facility];  Service: General;  Laterality: N/A;  GENERAL AND TAP BLOCK   PATCH ANGIOPLASTY Left 04/25/2021   Procedure: PATCH ANGIOPLASTY XENOSURE 1CM X 6CM;  Surgeon: Marty Heck, MD;  Location: [place];  Service: Vascular;  Laterality: Left;   PORTACATH PLACEMENT Left 11/17/2021   Procedure: PORT PLACEMENT;  Surgeon: [name], MD;  Location: [facility];  Service: General;  Laterality: Left;   PROSTATE BIOPSY  05/23/13   gleason 7, volume 11.5 ml   WHIPPLE PROCEDURE N/A 10/19/2021   Procedure: ATTEMPTED WHIPPLE PROCEDURE  WITH GASTRODUODENAL BYPASS;  Surgeon: [name], MD;  Location: [place];  Service: General;  Laterality: N/A;  GENERAL AND TAP BLOCK    I have reviewed the social history and family history with the patient and they are unchanged from previous note.  ALLERGIES:  is allergic to lyrica [pregabalin].  MEDICATIONS:  Current Outpatient Medications  Medication Sig Dispense Refill   amLODipine (NORVASC) 10 MG tablet Take 1 tablet by mouth once daily 90 tablet 3   aspirin EC 81 MG EC tablet Take 1 tablet (81 mg total) by mouth daily. Swallow whole. 30 tablet 11   atorvastatin (LIPITOR) 40 MG tablet Take 1 tablet by mouth once daily 90 tablet 2   benazepril (LOTENSIN) 40 MG tablet Take 1 tablet by mouth once daily 90 tablet 3   carvedilol (COREG) 12.5 MG tablet Take 12.5 mg by mouth 2 (two) times daily.     iron polysaccharides (FERREX 150) 150 MG capsule Take 1 capsule by mouth twice daily 180 capsule 0   lidocaine-prilocaine (EMLA) cream Apply to affected area once 30 g 3   oxyCODONE (OXY IR/ROXICODONE) 5 MG immediate release tablet Take 1 tablet (5 mg total) by mouth every 4 (four) hours as needed for moderate pain. (Patient not taking: Reported on 11/16/2021) 20 tablet 0   oxyCODONE (OXY IR/ROXICODONE) 5 MG immediate release tablet Take 1 tablet (5 mg total) by mouth every 6 (six) hours as needed for severe pain. 5  tablet 0   pantoprazole (PROTONIX) 40 MG tablet Take 1 tablet (40 mg total) by mouth daily. 90 tablet 3   potassium chloride 20 MEQ TBCR Take 20 mEq by mouth 3 (three) times daily. 90 tablet 0   prochlorperazine (COMPAZINE) 10 MG tablet Take 1 tablet (10 mg total) by mouth every 6 (six) hours as needed for nausea or vomiting (Use for nausea and / or vomiting unresolved with ondansetron (Zofran).). (Patient not taking: Reported on 11/16/2021) 30 tablet 0   traMADol (ULTRAM) 50 MG tablet TAKE 1 TABLET BY MOUTH EVERY 6 HOURS AS NEEDED FOR PAIN 120 tablet 1   Travoprost, BAK Free, (TRAVATAN) 0.004 % SOLN ophthalmic solution Place 1 drop into both eyes at bedtime.     VITAMIN D PO Take 2 tablets by mouth daily. Gummies     No current facility-administered medications for this visit.   Facility-Administered Medications  Ordered in Other Visits  Medication Dose Route Frequency Provider Last Rate Last Admin   0.9 %  sodium chloride infusion   Intravenous Continuous [name], MD 20 mL/hr at 12/08/21 1142 New Bag at 12/08/21 1142   fluorouracil (ADRUCIL) 5,000 mg in sodium chloride 0.9 % 150 mL chemo infusion  2,375 mg/m2 (Treatment Plan Recorded) Intravenous 1 day or 1 dose [name], MD       leucovorin 844 mg in dextrose 5 % 250 mL infusion  400 mg/m2 (Treatment Plan Recorded) Intravenous Once [name], MD       oxaliplatin (ELOXATIN) 150 mg in dextrose 5 % 500 mL chemo infusion  70 mg/m2 (Treatment Plan Recorded) Intravenous Once [name], MD        PHYSICAL EXAMINATION: ECOG PERFORMANCE STATUS: 2 - Symptomatic, <50% confined to bed  Vitals:   12/08/21 1122  BP: (!) 134/59  Pulse: 73  Resp: 16  Temp: 97.9 F (36.6 C)  SpO2: 100%   Wt Readings from Last 3 Encounters:  12/08/21 203 lb 4.8 oz (92.2 kg)  11/24/21 207 lb (93.9 kg)  11/17/21 200 lb (90.7 kg)     GENERAL:alert, no distress and comfortable SKIN: skin color normal, no rashes or significant lesions EYES: normal, Conjunctiva  are pink and non-injected, sclera clear  NEURO: alert & oriented x 3 with fluent speech  LABORATORY DATA:  I have reviewed the data as listed    Latest Ref Rng & Units 12/08/2021   11:04 AM 11/24/2021   11:23 AM 10/31/2021   12:11 PM  CBC  WBC 4.0 - 10.5 K/uL 3.2  6.3  8.5   Hemoglobin 13.0 - 17.0 g/dL 9.2  8.4  9.5   Hematocrit 39.0 - 52.0 % 29.5  26.8  30.1   Platelets 150 - 400 K/uL 203  211  338.0         Latest Ref Rng & Units 12/08/2021   11:04 AM 11/24/2021   11:23 AM 10/31/2021   12:11 PM  CMP  Glucose 70 - 99 mg/dL 126  106  103   BUN 8 - 23 mg/dL $Remove'12  15  19   'ILlvaGG$ Creatinine 0.61 - 1.24 mg/dL 1.00  1.01  1.22   Sodium 135 - 145 mmol/L 144  142  141   Potassium 3.5 - 5.1 mmol/L 3.9  4.0  4.8   Chloride 98 - 111 mmol/L 114  111  109   CO2 22 - 32 mmol/L $RemoveB'27  27  26   'PfsQCIxr$ Calcium 8.9 - 10.3 mg/dL 8.3  8.4  9.0   Total Protein 6.5 - 8.1 g/dL 5.5  5.7    Total Bilirubin 0.3 - 1.2 mg/dL 0.8  0.8    Alkaline Phos 38 - 126 U/L 143  273    AST 15 - 41 U/L 13  62    ALT 0 - 44 U/L 25  143        RADIOGRAPHIC STUDIES: I have personally reviewed the radiological images as listed and agreed with the findings in the report. No results found.    Orders Placed This Encounter  Procedures   CBC with Differential ([facility] Only)    Standing Status:   Future    Standing Expiration Date:   01/27/2023   CMP ([facility] only)    Standing Status:   Future    Standing Expiration Date:   01/27/2023   All questions were answered. The patient knows to call the clinic with  any problems, questions or concerns. No barriers to learning was detected. The total time spent in the appointment was 30 minutes.     [name], MD 12/08/2021   I, Wilburn Mylar, am acting as scribe for [name], MD.   I have reviewed the above documentation for accuracy and completeness, and I agree with the above.

## 2021-12-10 ENCOUNTER — Inpatient Hospital Stay: Payer: Medicare Other

## 2021-12-10 VITALS — BP 127/56 | HR 65 | Temp 97.5°F | Resp 18 | Ht 66.0 in

## 2021-12-10 DIAGNOSIS — Z5111 Encounter for antineoplastic chemotherapy: Secondary | ICD-10-CM | POA: Diagnosis not present

## 2021-12-10 DIAGNOSIS — C170 Malignant neoplasm of duodenum: Secondary | ICD-10-CM

## 2021-12-10 MED ORDER — SODIUM CHLORIDE 0.9% FLUSH
10.0000 mL | INTRAVENOUS | Status: DC | PRN
  Administered 2021-12-10: 10 mL

## 2021-12-10 MED ORDER — HEPARIN SOD (PORK) LOCK FLUSH 100 UNIT/ML IV SOLN
500.0000 [IU] | Freq: Once | INTRAVENOUS | Status: AC | PRN
  Administered 2021-12-10: 500 [IU]

## 2021-12-10 NOTE — Patient Instructions (Signed)

## 2021-12-22 ENCOUNTER — Inpatient Hospital Stay: Payer: Medicare Other | Admitting: Hematology

## 2021-12-22 ENCOUNTER — Inpatient Hospital Stay: Payer: Medicare Other

## 2021-12-27 MED FILL — Dexamethasone Sodium Phosphate Inj 100 MG/10ML: INTRAMUSCULAR | Qty: 1 | Status: AC

## 2021-12-28 ENCOUNTER — Encounter: Payer: Self-pay | Admitting: Hematology

## 2021-12-28 ENCOUNTER — Inpatient Hospital Stay: Payer: Medicare Other | Attending: Nurse Practitioner

## 2021-12-28 ENCOUNTER — Inpatient Hospital Stay: Payer: Medicare Other

## 2021-12-28 ENCOUNTER — Other Ambulatory Visit: Payer: Self-pay

## 2021-12-28 ENCOUNTER — Inpatient Hospital Stay (HOSPITAL_BASED_OUTPATIENT_CLINIC_OR_DEPARTMENT_OTHER): Payer: Medicare Other | Admitting: Hematology

## 2021-12-28 VITALS — BP 142/58 | HR 68 | Temp 97.9°F | Resp 18 | Ht 66.0 in | Wt 200.8 lb

## 2021-12-28 DIAGNOSIS — Z8546 Personal history of malignant neoplasm of prostate: Secondary | ICD-10-CM | POA: Insufficient documentation

## 2021-12-28 DIAGNOSIS — E876 Hypokalemia: Secondary | ICD-10-CM | POA: Insufficient documentation

## 2021-12-28 DIAGNOSIS — Z7982 Long term (current) use of aspirin: Secondary | ICD-10-CM | POA: Insufficient documentation

## 2021-12-28 DIAGNOSIS — Z5189 Encounter for other specified aftercare: Secondary | ICD-10-CM | POA: Insufficient documentation

## 2021-12-28 DIAGNOSIS — Z8673 Personal history of transient ischemic attack (TIA), and cerebral infarction without residual deficits: Secondary | ICD-10-CM | POA: Insufficient documentation

## 2021-12-28 DIAGNOSIS — Z90411 Acquired partial absence of pancreas: Secondary | ICD-10-CM | POA: Insufficient documentation

## 2021-12-28 DIAGNOSIS — D508 Other iron deficiency anemias: Secondary | ICD-10-CM | POA: Insufficient documentation

## 2021-12-28 DIAGNOSIS — I10 Essential (primary) hypertension: Secondary | ICD-10-CM | POA: Insufficient documentation

## 2021-12-28 DIAGNOSIS — C170 Malignant neoplasm of duodenum: Secondary | ICD-10-CM | POA: Insufficient documentation

## 2021-12-28 DIAGNOSIS — Z9049 Acquired absence of other specified parts of digestive tract: Secondary | ICD-10-CM | POA: Diagnosis not present

## 2021-12-28 DIAGNOSIS — Z5111 Encounter for antineoplastic chemotherapy: Secondary | ICD-10-CM | POA: Diagnosis not present

## 2021-12-28 DIAGNOSIS — Z7902 Long term (current) use of antithrombotics/antiplatelets: Secondary | ICD-10-CM | POA: Insufficient documentation

## 2021-12-28 DIAGNOSIS — Z79899 Other long term (current) drug therapy: Secondary | ICD-10-CM | POA: Insufficient documentation

## 2021-12-28 DIAGNOSIS — Z95828 Presence of other vascular implants and grafts: Secondary | ICD-10-CM

## 2021-12-28 DIAGNOSIS — Z923 Personal history of irradiation: Secondary | ICD-10-CM | POA: Diagnosis not present

## 2021-12-28 LAB — CBC WITH DIFFERENTIAL (CANCER CENTER ONLY)
Abs Immature Granulocytes: 0.03 10*3/uL (ref 0.00–0.07)
Basophils Absolute: 0 10*3/uL (ref 0.0–0.1)
Basophils Relative: 1 %
Eosinophils Absolute: 0.1 10*3/uL (ref 0.0–0.5)
Eosinophils Relative: 2 %
HCT: 34.9 % — ABNORMAL LOW (ref 39.0–52.0)
Hemoglobin: 10.9 g/dL — ABNORMAL LOW (ref 13.0–17.0)
Immature Granulocytes: 1 %
Lymphocytes Relative: 27 %
Lymphs Abs: 1.3 10*3/uL (ref 0.7–4.0)
MCH: 25.1 pg — ABNORMAL LOW (ref 26.0–34.0)
MCHC: 31.2 g/dL (ref 30.0–36.0)
MCV: 80.2 fL (ref 80.0–100.0)
Monocytes Absolute: 0.7 10*3/uL (ref 0.1–1.0)
Monocytes Relative: 14 %
Neutro Abs: 2.8 10*3/uL (ref 1.7–7.7)
Neutrophils Relative %: 55 %
Platelet Count: 218 10*3/uL (ref 150–400)
RBC: 4.35 MIL/uL (ref 4.22–5.81)
RDW: 21.2 % — ABNORMAL HIGH (ref 11.5–15.5)
WBC Count: 5.1 10*3/uL (ref 4.0–10.5)
nRBC: 0 % (ref 0.0–0.2)

## 2021-12-28 LAB — CMP (CANCER CENTER ONLY)
ALT: 19 U/L (ref 0–44)
AST: 19 U/L (ref 15–41)
Albumin: 2.9 g/dL — ABNORMAL LOW (ref 3.5–5.0)
Alkaline Phosphatase: 100 U/L (ref 38–126)
Anion gap: 3 — ABNORMAL LOW (ref 5–15)
BUN: 14 mg/dL (ref 8–23)
CO2: 28 mmol/L (ref 22–32)
Calcium: 8.2 mg/dL — ABNORMAL LOW (ref 8.9–10.3)
Chloride: 113 mmol/L — ABNORMAL HIGH (ref 98–111)
Creatinine: 1.03 mg/dL (ref 0.61–1.24)
GFR, Estimated: >60 mL/min
Glucose, Bld: 113 mg/dL — ABNORMAL HIGH (ref 70–99)
Potassium: 4 mmol/L (ref 3.5–5.1)
Sodium: 144 mmol/L (ref 135–145)
Total Bilirubin: 0.8 mg/dL (ref 0.3–1.2)
Total Protein: 5.6 g/dL — ABNORMAL LOW (ref 6.5–8.1)

## 2021-12-28 LAB — CEA (IN HOUSE-CHCC): CEA (CHCC-In House): 5.28 ng/mL — ABNORMAL HIGH (ref 0.00–5.00)

## 2021-12-28 MED ORDER — SODIUM CHLORIDE 0.9 % IV SOLN
10.0000 mg | Freq: Once | INTRAVENOUS | Status: AC
  Administered 2021-12-28: 10 mg via INTRAVENOUS
  Filled 2021-12-28: qty 10

## 2021-12-28 MED ORDER — LEUCOVORIN CALCIUM INJECTION 350 MG
400.0000 mg/m2 | Freq: Once | INTRAVENOUS | Status: AC
  Administered 2021-12-28: 844 mg via INTRAVENOUS
  Filled 2021-12-28: qty 42.2

## 2021-12-28 MED ORDER — DEXTROSE 5 % IV SOLN: Freq: Once | INTRAVENOUS | Status: AC

## 2021-12-28 MED ORDER — OXALIPLATIN CHEMO INJECTION 100 MG/20ML
70.0000 mg/m2 | Freq: Once | INTRAVENOUS | Status: AC
  Administered 2021-12-28: 150 mg via INTRAVENOUS
  Filled 2021-12-28: qty 20

## 2021-12-28 MED ORDER — SODIUM CHLORIDE 0.9% FLUSH
10.0000 mL | INTRAVENOUS | Status: AC | PRN
  Administered 2021-12-28: 10 mL

## 2021-12-28 MED ORDER — SODIUM CHLORIDE 0.9 % IV SOLN
2375.0000 mg/m2 | INTRAVENOUS | Status: DC
  Administered 2021-12-28: 5000 mg via INTRAVENOUS
  Filled 2021-12-28: qty 100

## 2021-12-28 MED ORDER — PALONOSETRON HCL INJECTION 0.25 MG/5ML
0.2500 mg | Freq: Once | INTRAVENOUS | Status: AC
  Administered 2021-12-28: 0.25 mg via INTRAVENOUS
  Filled 2021-12-28: qty 5

## 2021-12-28 NOTE — Patient Instructions (Signed)
[facility] MEDICAL ONCOLOGY  Discharge Instructions: Thank you for choosing [facility] to provide your oncology and hematology care.   If you have a lab appointment with the Cancer Center, please go directly to the Cancer Center and check in at the registration area.   Wear comfortable clothing and clothing appropriate for easy access to any Portacath or PICC line.   We strive to give you quality time with your provider. You may need to reschedule your appointment if you arrive late (15 or more minutes).  Arriving late affects you and other patients whose appointments are after yours.  Also, if you miss three or more appointments without notifying the office, you may be dismissed from the clinic at the provider's discretion.      For prescription refill requests, have your pharmacy contact our office and allow 72 hours for refills to be completed.    Today you received the following chemotherapy and/or immunotherapy agents oxaliplatin, leucovorin, fluorourcil      To help prevent nausea and vomiting after your treatment, we encourage you to take your nausea medication as directed.  BELOW ARE SYMPTOMS THAT SHOULD BE REPORTED IMMEDIATELY: *FEVER GREATER THAN 100.4 F (38 C) OR HIGHER *CHILLS OR SWEATING *NAUSEA AND VOMITING THAT IS NOT CONTROLLED WITH YOUR NAUSEA MEDICATION *UNUSUAL SHORTNESS OF BREATH *UNUSUAL BRUISING OR BLEEDING *URINARY PROBLEMS (pain or burning when urinating, or frequent urination) *BOWEL PROBLEMS (unusual diarrhea, constipation, pain near the anus) TENDERNESS IN MOUTH AND THROAT WITH OR WITHOUT PRESENCE OF ULCERS (sore throat, sores in mouth, or a toothache) UNUSUAL RASH, SWELLING OR PAIN  UNUSUAL VAGINAL DISCHARGE OR ITCHING   Items with * indicate a potential emergency and should be followed up as soon as possible or go to the Emergency Department if any problems should occur.  Please show the CHEMOTHERAPY ALERT CARD or IMMUNOTHERAPY  ALERT CARD at check-in to the Emergency Department and triage nurse.  Should you have questions after your visit or need to cancel or reschedule your appointment, please contact [facility] MEDICAL ONCOLOGY  Dept: [phone]  and follow the prompts.  Office hours are 8:00 a.m. to 4:30 p.m. Monday - Friday. Please note that voicemails left after 4:00 p.m. may not be returned until the following business day.  We are closed weekends and major holidays. You have access to a nurse at all times for urgent questions. Please call the main number to the clinic Dept: [phone] and follow the prompts.   For any non-urgent questions, you may also contact your provider using MyChart. We now offer e-Visits for anyone 18 and older to request care online for non-urgent symptoms. For details visit [email or web address].   Also download the MyChart app! Go to the app store, search "MyChart", open the app, select , and log in with your MyChart username and password.  Masks are optional in the cancer centers. If you would like for your care team to wear a mask while they are taking care of you, please let them know. You may have one support person who is at least 78 years old accompany you for your appointments. 

## 2021-12-28 NOTE — Progress Notes (Signed)
[facility]   [phone]) [phone] Fax:([phone]) [phone]   Clinic Follow up Note   Patient Care Team: Biagio Borg, MD as PCP - General Stanford Breed, Denice Bors, MD as PCP - Cardiology (Cardiology) [name], MD as Consulting Physician (Gastroenterology) [name], MD as Consulting Physician (Orthopedic Surgery) [name], MD as Consulting Physician (Ophthalmology) [name], MD as Consulting Physician (Oncology)  Date of Service:  12/28/2021  CHIEF COMPLAINT: f/u of duodenal cancer  CURRENT THERAPY:  FOLFOX, q14d, starting 11/24/21  ASSESSMENT & PLAN:  Aaron Moss is a 76 y.o. male with   1. 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 Although the scope could not pass patient has no signs of obstruction. Biopsy confirmed moderate to poorly differentiated adenocarcinoma -resection was delayed due to his stroke on 04/18/21 -he received 8 cycles neoadjuvant Keytruda 05/11/21 - 09/28/21. He tolerated well with mild fatigue.  -he was taken for Whipple surgery on 10/19/21. However, during the procedure, Dr. Barry Dienes found that the tumor was unable to be resected due to obscuring of hepatic artery.  He underwent gastric bypass surgery due to the pending obstruction. -He began FOLFOX on 11/24/21. He has tolerated well overall with some taste changes and mild fatigue -labs reviewed, his counts recovered with the extra week off-- ANC 2.8, hgb 10.9. CMP showed further decrease in calcium. We reviewed diet below. Adequate to proceed with C3 today. -plan to repeat CT after cycle 5 or 6    2. Chemo toxicities: Taste change -he has lost 7 lbs since starting FOLFOX. He reports taste change from chemo. -I encouraged him to try high protein foods to see what he likes. I advised him to watch is weight and supplement  with protein shakes.  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 -he is on oral iron.  4. Lynch Syndrome, H/o prostate (2015) and colon (2000) cancers -he has a personal history of stage pT3N0 colon cancer in 2000 and prostate cancer dx in 2015, s/p 8 weeks of IMRT with Dr. Valere Dross. -genetic testing and counseling on 04/07/21 confirmed a PMS2 mutation, as well as VUS in ATM, BLM, and SDHA.   5. Comorbidities: TIA, s/p left endarterectomy carotid on 04/25/21; Hypokalemia; HTN -TIA on 04/18/21, on aspirin and Plavix, no residual neurodeficits -he has chronic low potassium, on KCL.     Plan -proceed with C3 FOLFOX today at same dose  -lab, flush, f/u, and FOLFOX in 2 weeks   No problem-specific Assessment & Plan notes found for this encounter.   SUMMARY OF ONCOLOGIC HISTORY: Oncology History  Adenocarcinoma of duodenum ([place])  03/01/2021 Imaging   EXAM: ABDOMEN ULTRASOUND COMPLETE  IMPRESSION: 1. Simple appearing right renal cyst. 2. Otherwise negative abdominal ultrasound   03/18/2021 Procedure   Upper Endoscopy/Colonoscopy, Dr. Loletha Carrow  Upper Endoscopy Impression: - Normal esophagus. - A single submucosal papule (nodule) found in the stomach. - Duodenal mass. Biopsied. Concerning for malignancy.  Colonoscopy Impression: - Diverticulosis in the entire examined colon. - The examined portion of the ileum was normal. - Patent end-to-end colo-colonic anastomosis, characterized by healthy appearing mucosa. - Internal hemorrhoids. - No specimens collected.   03/18/2021 Initial Biopsy   Diagnosis Duodenum, Biopsy - INVASIVE MODERATE TO POORLY DIFFERENTIATED ADENOCARCINOMA   03/18/2021 Cancer Staging   Staging form: Small Intestine -  Adenocarcinoma, AJCC 8th Edition - Clinical stage from 03/18/2021: Stage IIIA (cTX, cN1, cM0) - Signed by [name], MD on 06/22/2021 Stage prefix: Initial diagnosis   03/22/2021 Initial Diagnosis   Adenocarcinoma  of duodenum ([place])   04/01/2021 Imaging   EXAM: CT CHEST, ABDOMEN, AND PELVIS WITH CONTRAST  IMPRESSION: 1. Circumferential wall thickening of the first portion of the duodenum, in keeping with known primary malignancy. 2. Enlarged, hypodense lymph nodes anterior to the pancreatic head and in the portacaval station, concerning for nodal metastatic disease. 3. No other evidence of metastatic disease in the chest, abdomen, or pelvis. 4. Incidental note of a fluid attenuation lesion within the proximal pancreatic body measuring 1.9 x 1.4 cm, with prominence of the pancreatic duct distally, up to 0.6 cm. This lesion was present on remote prior examination dated 05/04/2009, and has slightly increased in size over a long period of time. This is consistent with a small IPMN and benign given indolent behavior over greater than 10 years. 5. Background of very fine centrilobular pulmonary nodules, most concentrated in the lung apices, most commonly seen in smoking-related respiratory bronchiolitis. 6. Coronary artery disease.   Aortic Atherosclerosis (ICD10-I70.0).    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 Of note, a variant of uncertain significance was detected in the ATM (p.A869G), BLM (c.800-3T>G), and SDHA (p.V632F) genes. Report date is 04/27/2021.  The CancerNext-Expanded gene panel offered by [facility] and includes sequencing, rearrangement, and RNA analysis for the following 77 genes: AIP, ALK, APC, ATM, AXIN2, BAP1, BARD1, BLM, BMPR1A, BRCA1, BRCA2, BRIP1, CDC73, CDH1, CDK4, CDKN1B, CDKN2A, CHEK2, CTNNA1, DICER1, FANCC, FH, FLCN, GALNT12, KIF1B, LZTR1, MAX, MEN1, MET, MLH1, MSH2, MSH3, MSH6, MUTYH, NBN, NF1, NF2, NTHL1, PALB2, PHOX2B, PMS2, POT1, PRKAR1A, PTCH1, PTEN, RAD51C, RAD51D, RB1, RECQL, RET, SDHA, SDHAF2, SDHB, SDHC, SDHD, SMAD4, SMARCA4, SMARCB1, SMARCE1, STK11, SUFU,  TMEM127, TP53, TSC1, TSC2, VHL and XRCC2 (sequencing and deletion/duplication); EGFR, EGLN1, HOXB13, KIT, MITF, PDGFRA, POLD1, and POLE (sequencing only); EPCAM and GREM1 (deletion/duplication only).    05/11/2021 - 09/28/2021 Chemotherapy   Patient is on Treatment Plan : COLORECTAL Pembrolizumab (200) q21d     11/24/2021 -  Chemotherapy   Patient is on Treatment Plan : COLORECTAL FOLFOX q14d x 4 months        INTERVAL HISTORY:  Aaron Moss is here for a follow up of duodenal cancer. He was last seen by me on 12/08/21. He was seen in the infusion area. He reports he is doing well and enjoyed his recent vacation. He notes his appetite is improving but is not back to normal due to taste change from chemo.   All other systems were reviewed with the patient and are negative.  MEDICAL HISTORY:  Past Medical History:  Diagnosis Date   Anemia 10/31/2021   Dx IDA   Carotid stenosis, left    Colon cancer ([place]) 2000   Coronary artery disease    per Dr. Jacalyn Lefevre note from 04/13/21   Diverticulosis of colon    Duodenal cancer ([place]) 01/2021   GERD (gastroesophageal reflux disease)    Glaucoma    History of chemotherapy 2000   colon cancer   History of radiation therapy 08/06/13- 10/03/13   prostate 7800 cGy 40 sessions, seminal vesicles 5600 cGy 40 sessions   Hyperlipidemia    Hypertension    Left knee DJD    Prostate cancer ([place]) 05/23/2013  gleason 3+4=7, volume 11.5 ml   Stroke ([place]) 04/18/2021   TIA (transient ischemic attack)     SURGICAL HISTORY: Past Surgical History:  Procedure Laterality Date   CHOLECYSTECTOMY N/A 10/19/2021   Procedure: CHOLECYSTECTOMY;  Surgeon: [name], MD;  Location: [facility];  Service: General;  Laterality: N/A;   COLON SURGERY  2000   colon cancer   COLONOSCOPY     ENDARTERECTOMY Left 04/25/2021   Procedure: LEFT ENDARTERECTOMY CAROTID;  Surgeon: Marty Heck, MD;  Location: [facility];  Service: Vascular;  Laterality: Left;   EUS      pancreatic cyst   KNEE ARTHROSCOPY  2007   LT- [facility]   LAPAROSCOPY N/A 10/19/2021   Procedure: LAPAROSCOPY DIAGNOSTIC;  Surgeon: [name], MD;  Location: [facility];  Service: General;  Laterality: N/A;  GENERAL AND TAP BLOCK   PATCH ANGIOPLASTY Left 04/25/2021   Procedure: PATCH ANGIOPLASTY XENOSURE 1CM X 6CM;  Surgeon: Marty Heck, MD;  Location: [facility];  Service: Vascular;  Laterality: Left;   PORTACATH PLACEMENT Left 11/17/2021   Procedure: PORT PLACEMENT;  Surgeon: [name], MD;  Location: [facility];  Service: General;  Laterality: Left;   PROSTATE BIOPSY  05/23/13   gleason 7, volume 11.5 ml   WHIPPLE PROCEDURE N/A 10/19/2021   Procedure: ATTEMPTED WHIPPLE PROCEDURE  WITH GASTRODUODENAL BYPASS;  Surgeon: [name], MD;  Location: [facility];  Service: General;  Laterality: N/A;  GENERAL AND TAP BLOCK    I have reviewed the social history and family history with the patient and they are unchanged from previous note.  ALLERGIES:  is allergic to lyrica [pregabalin].  MEDICATIONS:  Current Outpatient Medications  Medication Sig Dispense Refill   amLODipine (NORVASC) 10 MG tablet Take 1 tablet by mouth once daily 90 tablet 3   aspirin EC 81 MG EC tablet Take 1 tablet (81 mg total) by mouth daily. Swallow whole. 30 tablet 11   atorvastatin (LIPITOR) 40 MG tablet Take 1 tablet by mouth once daily 90 tablet 2   benazepril (LOTENSIN) 40 MG tablet Take 1 tablet by mouth once daily 90 tablet 3   carvedilol (COREG) 12.5 MG tablet Take 12.5 mg by mouth 2 (two) times daily.     iron polysaccharides (FERREX 150) 150 MG capsule Take 1 capsule by mouth twice daily 180 capsule 0   lidocaine-prilocaine (EMLA) cream Apply to affected area once 30 g 3   oxyCODONE (OXY IR/ROXICODONE) 5 MG immediate release tablet Take 1 tablet (5 mg total) by mouth every 4 (four) hours as needed for moderate pain. (Patient not taking: Reported on 11/16/2021) 20 tablet 0   oxyCODONE (OXY IR/ROXICODONE) 5 MG immediate  release tablet Take 1 tablet (5 mg total) by mouth every 6 (six) hours as needed for severe pain. 5 tablet 0   pantoprazole (PROTONIX) 40 MG tablet Take 1 tablet (40 mg total) by mouth daily. 90 tablet 3   potassium chloride 20 MEQ TBCR Take 20 mEq by mouth 3 (three) times daily. 90 tablet 0   prochlorperazine (COMPAZINE) 10 MG tablet Take 1 tablet (10 mg total) by mouth every 6 (six) hours as needed for nausea or vomiting (Use for nausea and / or vomiting unresolved with ondansetron (Zofran).). (Patient not taking: Reported on 11/16/2021) 30 tablet 0   traMADol (ULTRAM) 50 MG tablet TAKE 1 TABLET BY MOUTH EVERY 6 HOURS AS NEEDED FOR PAIN 120 tablet 1   Travoprost, BAK Free, (TRAVATAN) 0.004 % SOLN ophthalmic solution Place  1 drop into both eyes at bedtime.     VITAMIN D PO Take 2 tablets by mouth daily. Gummies     No current facility-administered medications for this visit.    PHYSICAL EXAMINATION: ECOG PERFORMANCE STATUS: 1 - Symptomatic but completely ambulatory  There were no vitals filed for this visit. Wt Readings from Last 3 Encounters:  12/28/21 200 lb 12 oz (91.1 kg)  12/08/21 203 lb 4.8 oz (92.2 kg)  11/24/21 207 lb (93.9 kg)     GENERAL:alert, no distress and comfortable SKIN: skin color normal, no rashes or significant lesions EYES: normal, Conjunctiva are pink and non-injected, sclera clear  NEURO: alert & oriented x 3 with fluent speech  LABORATORY DATA:  I have reviewed the data as listed    Latest Ref Rng & Units 12/28/2021   11:15 AM 12/08/2021   11:04 AM 11/24/2021   11:23 AM  CBC  WBC 4.0 - 10.5 K/uL 5.1  3.2  6.3   Hemoglobin 13.0 - 17.0 g/dL 10.9  9.2  8.4   Hematocrit 39.0 - 52.0 % 34.9  29.5  26.8   Platelets 150 - 400 K/uL 218  203  211         Latest Ref Rng & Units 12/28/2021   11:15 AM 12/08/2021   11:04 AM 11/24/2021   11:23 AM  CMP  Glucose 70 - 99 mg/dL 113  126  106   BUN 8 - 23 mg/dL _0 Creatinine 0.61 - 1.24 mg/dL 1.03  1.00  1.01    Sodium 135 - 145 mmol/L 144  144  142   Potassium 3.5 - 5.1 mmol/L 4.0  3.9  4.0   Chloride 98 - 111 mmol/L 113  114  111   CO2 22 - 32 mmol/L _1 Calcium 8.9 - 10.3 mg/dL 8.2  8.3  8.4   Total Protein 6.5 - 8.1 g/dL 5.6  5.5  5.7   Total Bilirubin 0.3 - 1.2 mg/dL 0.8  0.8  0.8   Alkaline Phos 38 - 126 U/L 100  143  273   AST 15 - 41 U/L 19  13  62   ALT 0 - 44 U/L 19  25  143       RADIOGRAPHIC STUDIES: I have personally reviewed the radiological images as listed and agreed with the findings in the report. No results found.    No orders of the defined types were placed in this encounter.  All questions were answered. The patient knows to call the clinic with any problems, questions or concerns. No barriers to learning was detected. The total time spent in the appointment was 30 minutes.     [name], MD 12/28/2021   I, Wilburn Mylar, am acting as scribe for [name], MD.   I have reviewed the above documentation for accuracy and completeness, and I agree with the above.

## 2021-12-30 ENCOUNTER — Inpatient Hospital Stay: Payer: Medicare Other

## 2021-12-30 ENCOUNTER — Other Ambulatory Visit: Payer: Self-pay | Admitting: Hematology

## 2021-12-30 VITALS — BP 128/78

## 2021-12-30 DIAGNOSIS — C170 Malignant neoplasm of duodenum: Secondary | ICD-10-CM

## 2021-12-30 MED ORDER — HEPARIN SOD (PORK) LOCK FLUSH 100 UNIT/ML IV SOLN: 500.0000 [IU] | Freq: Once | INTRAVENOUS | Status: DC | PRN

## 2021-12-30 MED ORDER — SODIUM CHLORIDE 0.9% FLUSH: 10.0000 mL | INTRAVENOUS | Status: DC | PRN

## 2022-01-02 ENCOUNTER — Other Ambulatory Visit: Payer: Self-pay | Admitting: Internal Medicine

## 2022-01-04 ENCOUNTER — Ambulatory Visit: Payer: Medicare Other | Admitting: Nurse Practitioner

## 2022-01-04 ENCOUNTER — Ambulatory Visit: Payer: Medicare Other

## 2022-01-04 ENCOUNTER — Other Ambulatory Visit: Payer: Medicare Other

## 2022-01-11 MED FILL — Dexamethasone Sodium Phosphate Inj 100 MG/10ML: INTRAMUSCULAR | Qty: 1 | Status: AC

## 2022-01-12 ENCOUNTER — Inpatient Hospital Stay: Payer: Medicare Other

## 2022-01-12 ENCOUNTER — Encounter: Payer: Self-pay | Admitting: Hematology

## 2022-01-12 ENCOUNTER — Other Ambulatory Visit: Payer: Self-pay

## 2022-01-12 ENCOUNTER — Inpatient Hospital Stay (HOSPITAL_BASED_OUTPATIENT_CLINIC_OR_DEPARTMENT_OTHER): Payer: Medicare Other | Admitting: Hematology

## 2022-01-12 VITALS — BP 140/54 | HR 68 | Temp 98.1°F | Resp 17 | Ht 66.0 in | Wt 200.0 lb

## 2022-01-12 DIAGNOSIS — C170 Malignant neoplasm of duodenum: Secondary | ICD-10-CM

## 2022-01-12 DIAGNOSIS — Z5111 Encounter for antineoplastic chemotherapy: Secondary | ICD-10-CM | POA: Diagnosis not present

## 2022-01-12 LAB — CBC WITH DIFFERENTIAL (CANCER CENTER ONLY)
Abs Immature Granulocytes: 0 10*3/uL (ref 0.00–0.07)
Basophils Absolute: 0 10*3/uL (ref 0.0–0.1)
Basophils Relative: 1 %
Eosinophils Absolute: 0.2 10*3/uL (ref 0.0–0.5)
Eosinophils Relative: 8 %
HCT: 34 % — ABNORMAL LOW (ref 39.0–52.0)
Hemoglobin: 10.6 g/dL — ABNORMAL LOW (ref 13.0–17.0)
Immature Granulocytes: 0 %
Lymphocytes Relative: 34 %
Lymphs Abs: 1 10*3/uL (ref 0.7–4.0)
MCH: 25.1 pg — ABNORMAL LOW (ref 26.0–34.0)
MCHC: 31.2 g/dL (ref 30.0–36.0)
MCV: 80.6 fL (ref 80.0–100.0)
Monocytes Absolute: 0.4 10*3/uL (ref 0.1–1.0)
Monocytes Relative: 14 %
Neutro Abs: 1.3 10*3/uL — ABNORMAL LOW (ref 1.7–7.7)
Neutrophils Relative %: 43 %
Platelet Count: 130 10*3/uL — ABNORMAL LOW (ref 150–400)
RBC: 4.22 MIL/uL (ref 4.22–5.81)
RDW: 20 % — ABNORMAL HIGH (ref 11.5–15.5)
WBC Count: 3 10*3/uL — ABNORMAL LOW (ref 4.0–10.5)
nRBC: 0 % (ref 0.0–0.2)

## 2022-01-12 LAB — CMP (CANCER CENTER ONLY)
ALT: 22 U/L (ref 0–44)
AST: 19 U/L (ref 15–41)
Albumin: 3 g/dL — ABNORMAL LOW (ref 3.5–5.0)
Alkaline Phosphatase: 93 U/L (ref 38–126)
Anion gap: 5 (ref 5–15)
BUN: 12 mg/dL (ref 8–23)
CO2: 25 mmol/L (ref 22–32)
Calcium: 8.3 mg/dL — ABNORMAL LOW (ref 8.9–10.3)
Chloride: 113 mmol/L — ABNORMAL HIGH (ref 98–111)
Creatinine: 0.97 mg/dL (ref 0.61–1.24)
GFR, Estimated: >60 mL/min (ref >60)
Glucose, Bld: 125 mg/dL — ABNORMAL HIGH (ref 70–99)
Potassium: 3.6 mmol/L (ref 3.5–5.1)
Sodium: 143 mmol/L (ref 135–145)
Total Bilirubin: 0.9 mg/dL (ref 0.3–1.2)
Total Protein: 5.4 g/dL — ABNORMAL LOW (ref 6.5–8.1)

## 2022-01-12 LAB — IRON AND IRON BINDING CAPACITY (CC-WL,HP ONLY)
Iron: 25 ug/dL — ABNORMAL LOW (ref 45–182)
Saturation Ratios: 10 % — ABNORMAL LOW (ref 17.9–39.5)
TIBC: 252 ug/dL (ref 250–450)
UIBC: 227 ug/dL (ref 117–376)

## 2022-01-12 LAB — FERRITIN: Ferritin: 66 ng/mL (ref 24–336)

## 2022-01-12 MED ORDER — PALONOSETRON HCL INJECTION 0.25 MG/5ML
0.2500 mg | Freq: Once | INTRAVENOUS | Status: AC
  Administered 2022-01-12: 0.25 mg via INTRAVENOUS
  Filled 2022-01-12: qty 5

## 2022-01-12 MED ORDER — SODIUM CHLORIDE 0.9 % IV SOLN
2375.0000 mg/m2 | INTRAVENOUS | Status: DC
  Administered 2022-01-12: 5000 mg via INTRAVENOUS
  Filled 2022-01-12: qty 100

## 2022-01-12 MED ORDER — OXALIPLATIN CHEMO INJECTION 100 MG/20ML
70.0000 mg/m2 | Freq: Once | INTRAVENOUS | Status: AC
  Administered 2022-01-12: 150 mg via INTRAVENOUS
  Filled 2022-01-12: qty 20

## 2022-01-12 MED ORDER — DEXTROSE 5 % IV SOLN: Freq: Once | INTRAVENOUS | Status: AC

## 2022-01-12 MED ORDER — SODIUM CHLORIDE 0.9 % IV SOLN
10.0000 mg | Freq: Once | INTRAVENOUS | Status: AC
  Administered 2022-01-12: 10 mg via INTRAVENOUS
  Filled 2022-01-12: qty 10

## 2022-01-12 MED ORDER — LEUCOVORIN CALCIUM INJECTION 350 MG
400.0000 mg/m2 | Freq: Once | INTRAVENOUS | Status: AC
  Administered 2022-01-12: 844 mg via INTRAVENOUS
  Filled 2022-01-12: qty 25

## 2022-01-12 NOTE — Patient Instructions (Signed)
[facility]  Discharge Instructions: Thank you for choosing [facility] to provide your oncology and hematology care.   If you have a lab appointment with the [facility], please go directly to the [facility] and check in at the registration area.   Wear comfortable clothing and clothing appropriate for easy access to any Portacath or PICC line.   We strive to give you quality time with your provider. You may need to reschedule your appointment if you arrive late (15 or more minutes).  Arriving late affects you and other patients whose appointments are after yours.  Also, if you miss three or more appointments without notifying the office, you may be dismissed from the clinic at the provider's discretion.      For prescription refill requests, have your pharmacy contact our office and allow 72 hours for refills to be completed.    Today you received the following chemotherapy and/or immunotherapy agents oxaliplatin, leucovorin, fluorourcil      To help prevent nausea and vomiting after your treatment, we encourage you to take your nausea medication as directed.  BELOW ARE SYMPTOMS THAT SHOULD BE REPORTED IMMEDIATELY: *FEVER GREATER THAN 100.4 F (38 C) OR HIGHER *CHILLS OR SWEATING *NAUSEA AND VOMITING THAT IS NOT CONTROLLED WITH YOUR NAUSEA MEDICATION *UNUSUAL SHORTNESS OF BREATH *UNUSUAL BRUISING OR BLEEDING *URINARY PROBLEMS (pain or burning when urinating, or frequent urination) *BOWEL PROBLEMS (unusual diarrhea, constipation, pain near the anus) TENDERNESS IN MOUTH AND THROAT WITH OR WITHOUT PRESENCE OF ULCERS (sore throat, sores in mouth, or a toothache) UNUSUAL RASH, SWELLING OR PAIN  UNUSUAL VAGINAL DISCHARGE OR ITCHING   Items with * indicate a potential emergency and should be followed up as soon as possible or go to the Emergency Department if any problems should occur.  Please show the CHEMOTHERAPY ALERT CARD or IMMUNOTHERAPY  ALERT CARD at check-in to the Emergency Department and triage nurse.  Should you have questions after your visit or need to cancel or reschedule your appointment, please contact [facility]  Dept: [phone]  and follow the prompts.  Office hours are 8:00 a.m. to 4:30 p.m. Monday - Friday. Please note that voicemails left after 4:00 p.m. may not be returned until the following business day.  We are closed weekends and major holidays. You have access to a nurse at all times for urgent questions. Please call the main number to the clinic Dept: [phone] and follow the prompts.   For any non-urgent questions, you may also contact your provider using MyChart. We now offer e-Visits for anyone 49 and older to request care online for non-urgent symptoms. For details visit [email or web address].   Also download the MyChart app! Go to the app store, search "MyChart", open the app, select [facility], and log in with your MyChart username and password.  Masks are optional in the cancer centers. If you would like for your care team to wear a mask while they are taking care of you, please let them know. You may have one support Mariadel Mruk who is at least 77 years old accompany you for your appointments.

## 2022-01-12 NOTE — Progress Notes (Signed)
[facility]   [phone] [phone]   Clinic Follow up Note   Patient Care Team: Biagio Borg, MD as PCP - General Stanford Breed, Denice Bors, MD as PCP - Cardiology (Cardiology) [name], MD as Consulting Physician (Gastroenterology) [name], MD as Consulting Physician (Orthopedic Surgery) [name], MD as Consulting Physician (Ophthalmology) [name], MD as Consulting Physician (Oncology)  Date of Service:  01/12/2022  CHIEF COMPLAINT: f/u of duodenal cancer  CURRENT THERAPY:  FOLFOX, q14d, starting 11/24/21  ASSESSMENT & PLAN:  Aaron Moss is a 76 y.o. male with   1. 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 Although the scope could not pass patient has no signs of obstruction. Biopsy confirmed moderate to poorly differentiated adenocarcinoma -resection was delayed due to his stroke on 04/18/21 -he received 8 cycles neoadjuvant Keytruda 05/11/21 - 09/28/21. He tolerated well with mild fatigue.  -he was taken for Whipple surgery on 10/19/21. However, during the procedure, Dr. Barry Dienes found that the tumor was unable to be resected due to obscuring of hepatic artery.  He underwent gastric bypass surgery due to the pending obstruction. -He began FOLFOX on 11/24/21. He has tolerated well overall with some taste changes and mild fatigue -labs reviewed, his counts recovered with the extra week off-- ANC 2.8, hgb 10.9. CMP showed further decrease in calcium. Adequate to proceed with C4 today. -plan to repeat CT after cycle 5 or 6    2. Chemo toxicities: Taste change -he has lost 7 lbs since starting FOLFOX. He reports taste change from chemo. -weight is stable since last cycle 3 weeks ago.   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 -he is  on oral iron.   4. Lynch Syndrome, H/o prostate (2015) and colon (2000) cancers -he has a personal history of stage pT3N0 colon cancer in 2000 and prostate cancer dx in 2015, s/p 8 weeks of IMRT with Dr. Valere Dross. -genetic testing and counseling on 04/07/21 confirmed a PMS2 mutation, as well as VUS in ATM, BLM, and SDHA.   5. Comorbidities: TIA, s/p left endarterectomy carotid on 04/25/21; Hypokalemia; HTN -TIA on 04/18/21, on aspirin and Plavix, no residual neurodeficits -he has chronic low potassium, on KCL.     Plan -proceed with C4 FOLFOX today at same dose  -lab, flush, f/u, and FOLFOX in 2 weeks   No problem-specific Assessment & Plan notes found for this encounter.   SUMMARY OF ONCOLOGIC HISTORY: Oncology History  Adenocarcinoma of duodenum ([place])  03/01/2021 Imaging   EXAM: ABDOMEN ULTRASOUND COMPLETE  IMPRESSION: 1. Simple appearing right renal cyst. 2. Otherwise negative abdominal ultrasound   03/18/2021 Procedure   Upper Endoscopy/Colonoscopy, Dr. Loletha Carrow  Upper Endoscopy Impression: - Normal esophagus. - A single submucosal papule (nodule) found in the stomach. - Duodenal mass. Biopsied. Concerning for malignancy.  Colonoscopy Impression: - Diverticulosis in the entire examined colon. - The examined portion of the ileum was normal. - Patent end-to-end colo-colonic anastomosis, characterized by healthy appearing mucosa. - Internal hemorrhoids. - No specimens collected.   03/18/2021 Initial Biopsy   Diagnosis Duodenum, Biopsy - INVASIVE MODERATE TO POORLY DIFFERENTIATED ADENOCARCINOMA   03/18/2021 Cancer Staging   Staging form: Small Intestine - Adenocarcinoma, AJCC 8th Edition - Clinical stage from 03/18/2021: Stage IIIA (cTX, cN1, cM0) - Signed by Burr Medico,  Krista Blue, MD on 06/22/2021 Stage prefix: Initial diagnosis   03/22/2021 Initial Diagnosis   Adenocarcinoma of duodenum ([place])   04/01/2021 Imaging   EXAM: CT CHEST, ABDOMEN, AND PELVIS WITH  CONTRAST  IMPRESSION: 1. Circumferential wall thickening of the first portion of the duodenum, in keeping with known primary malignancy. 2. Enlarged, hypodense lymph nodes anterior to the pancreatic head and in the portacaval station, concerning for nodal metastatic disease. 3. No other evidence of metastatic disease in the chest, abdomen, or pelvis. 4. Incidental note of a fluid attenuation lesion within the proximal pancreatic body measuring 1.9 x 1.4 cm, with prominence of the pancreatic duct distally, up to 0.6 cm. This lesion was present on remote prior examination dated 05/04/2009, and has slightly increased in size over a long period of time. This is consistent with a small IPMN and benign given indolent behavior over greater than 10 years. 5. Background of very fine centrilobular pulmonary nodules, most concentrated in the lung apices, most commonly seen in smoking-related respiratory bronchiolitis. 6. Coronary artery disease.   Aortic Atherosclerosis (ICD10-I70.0).    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 Of note, a variant of uncertain significance was detected in the ATM (p.A869G), BLM (c.800-3T>G), and SDHA (p.V632F) genes. Report date is 04/27/2021.  The CancerNext-Expanded gene panel offered by [facility] and includes sequencing, rearrangement, and RNA analysis for the following 77 genes: AIP, ALK, APC, ATM, AXIN2, BAP1, BARD1, BLM, BMPR1A, BRCA1, BRCA2, BRIP1, CDC73, CDH1, CDK4, CDKN1B, CDKN2A, CHEK2, CTNNA1, DICER1, FANCC, FH, FLCN, GALNT12, KIF1B, LZTR1, MAX, MEN1, MET, MLH1, MSH2, MSH3, MSH6, MUTYH, NBN, NF1, NF2, NTHL1, PALB2, PHOX2B, PMS2, POT1, PRKAR1A, PTCH1, PTEN, RAD51C, RAD51D, RB1, RECQL, RET, SDHA, SDHAF2, SDHB, SDHC, SDHD, SMAD4, SMARCA4, SMARCB1, SMARCE1, STK11, SUFU, TMEM127, TP53, TSC1, TSC2, VHL and XRCC2 (sequencing and deletion/duplication);  EGFR, EGLN1, HOXB13, KIT, MITF, PDGFRA, POLD1, and POLE (sequencing only); EPCAM and GREM1 (deletion/duplication only).    05/11/2021 - 09/28/2021 Chemotherapy   Patient is on Treatment Plan : COLORECTAL Pembrolizumab (200) q21d     11/24/2021 -  Chemotherapy   Patient is on Treatment Plan : COLORECTAL FOLFOX q14d x 4 months        INTERVAL HISTORY:  LIEM COPENHAVER is here for a follow up of duodenal cancer. He was last seen by me on 12/28/21. He presents to the clinic accompanied by his daughter. He reports he has fatigue for a day or two. He also reports continued taste change. He notes mild diarrhea, about 2-3 times a day. He also notes a tremble in his hands; he notes it was present previously but seems to have gotten worse lately.   All other systems were reviewed with the patient and are negative.  MEDICAL HISTORY:  Past Medical History:  Diagnosis Date   Anemia 10/31/2021   Dx IDA   Carotid stenosis, left    Colon cancer ([place]) 2000   Coronary artery disease    per Dr. Jacalyn Lefevre note from 04/13/21   Diverticulosis of colon    Duodenal cancer ([place]) 01/2021   GERD (gastroesophageal reflux disease)    Glaucoma    History of chemotherapy 2000   colon cancer   History of radiation therapy 08/06/13- 10/03/13   prostate 7800 cGy 40 sessions, seminal vesicles 5600 cGy 40 sessions   Hyperlipidemia    Hypertension    Left knee DJD    Prostate cancer ([place]) 05/23/2013  gleason 3+4=7, volume 11.5 ml   Stroke ([place]) 04/18/2021   TIA (transient ischemic attack)     SURGICAL HISTORY: Past Surgical History:  Procedure Laterality Date   CHOLECYSTECTOMY N/A 10/19/2021   Procedure: CHOLECYSTECTOMY;  Surgeon: [name], MD;  Location: [facility];  Service: General;  Laterality: N/A;   COLON SURGERY  2000   colon cancer   COLONOSCOPY     ENDARTERECTOMY Left 04/25/2021   Procedure: LEFT ENDARTERECTOMY CAROTID;  Surgeon: Marty Heck, MD;  Location: [facility];  Service: Vascular;   Laterality: Left;   EUS     pancreatic cyst   KNEE ARTHROSCOPY  2007   LT- [facility]   LAPAROSCOPY N/A 10/19/2021   Procedure: LAPAROSCOPY DIAGNOSTIC;  Surgeon: [name], MD;  Location: [facility];  Service: General;  Laterality: N/A;  GENERAL AND TAP BLOCK   PATCH ANGIOPLASTY Left 04/25/2021   Procedure: PATCH ANGIOPLASTY XENOSURE 1CM X 6CM;  Surgeon: Marty Heck, MD;  Location: [facility];  Service: Vascular;  Laterality: Left;   PORTACATH PLACEMENT Left 11/17/2021   Procedure: PORT PLACEMENT;  Surgeon: [name], MD;  Location: [facility];  Service: General;  Laterality: Left;   PROSTATE BIOPSY  05/23/13   gleason 7, volume 11.5 ml   WHIPPLE PROCEDURE N/A 10/19/2021   Procedure: ATTEMPTED WHIPPLE PROCEDURE  WITH GASTRODUODENAL BYPASS;  Surgeon: [name], MD;  Location: [facility];  Service: General;  Laterality: N/A;  GENERAL AND TAP BLOCK    I have reviewed the social history and family history with the patient and they are unchanged from previous note.  ALLERGIES:  is allergic to lyrica [pregabalin].  MEDICATIONS:  Current Outpatient Medications  Medication Sig Dispense Refill   amLODipine (NORVASC) 10 MG tablet Take 1 tablet by mouth once daily 90 tablet 3   aspirin EC 81 MG EC tablet Take 1 tablet (81 mg total) by mouth daily. Swallow whole. 30 tablet 11   atorvastatin (LIPITOR) 40 MG tablet Take 1 tablet by mouth once daily 90 tablet 2   benazepril (LOTENSIN) 40 MG tablet Take 1 tablet by mouth once daily 90 tablet 3   carvedilol (COREG) 12.5 MG tablet Take 12.5 mg by mouth 2 (two) times daily.     iron polysaccharides (FERREX 150) 150 MG capsule Take 1 capsule by mouth twice daily 180 capsule 0   lidocaine-prilocaine (EMLA) cream Apply to affected area once 30 g 3   oxyCODONE (OXY IR/ROXICODONE) 5 MG immediate release tablet Take 1 tablet (5 mg total) by mouth every 4 (four) hours as needed for moderate pain. (Patient not taking: Reported on 11/16/2021) 20 tablet 0   oxyCODONE (OXY  IR/ROXICODONE) 5 MG immediate release tablet Take 1 tablet (5 mg total) by mouth every 6 (six) hours as needed for severe pain. 5 tablet 0   pantoprazole (PROTONIX) 40 MG tablet Take 1 tablet (40 mg total) by mouth daily. 90 tablet 3   potassium chloride 20 MEQ TBCR Take 20 mEq by mouth 3 (three) times daily. 90 tablet 0   prochlorperazine (COMPAZINE) 10 MG tablet Take 1 tablet (10 mg total) by mouth every 6 (six) hours as needed for nausea or vomiting (Use for nausea and / or vomiting unresolved with ondansetron (Zofran).). (Patient not taking: Reported on 11/16/2021) 30 tablet 0   traMADol (ULTRAM) 50 MG tablet TAKE 1 TABLET BY MOUTH EVERY 6 HOURS AS NEEDED FOR PAIN 120 tablet 1   Travoprost, BAK Free, (TRAVATAN) 0.004 % SOLN ophthalmic solution Place  1 drop into both eyes at bedtime.     VITAMIN D PO Take 2 tablets by mouth daily. Gummies     No current facility-administered medications for this visit.   Facility-Administered Medications Ordered in Other Visits  Medication Dose Route Frequency Provider Last Rate Last Admin   fluorouracil (ADRUCIL) 5,000 mg in sodium chloride 0.9 % 150 mL chemo infusion  2,375 mg/m2 (Treatment Plan Recorded) Intravenous 1 day or 1 dose [name], MD   5,000 mg at 01/12/22 1520    PHYSICAL EXAMINATION: ECOG PERFORMANCE STATUS: 1 - Symptomatic but completely ambulatory  Vitals:   01/12/22 1105  BP: (!) 140/54  Pulse: 68  Resp: 17  Temp: 98.1 F (36.7 C)  SpO2: 99%   Wt Readings from Last 3 Encounters:  01/12/22 200 lb (90.7 kg)  12/28/21 200 lb 12 oz (91.1 kg)  12/08/21 203 lb 4.8 oz (92.2 kg)     GENERAL:alert, no distress and comfortable SKIN: skin color normal, no rashes or significant lesions EYES: normal, Conjunctiva are pink and non-injected, sclera clear  NEURO: alert & oriented x 3 with fluent speech  LABORATORY DATA:  I have reviewed the data as listed    Latest Ref Rng & Units 01/12/2022   10:37 AM 12/28/2021   11:15 AM 12/08/2021    11:04 AM  CBC  WBC 4.0 - 10.5 K/uL 3.0  5.1  3.2   Hemoglobin 13.0 - 17.0 g/dL 10.6  10.9  9.2   Hematocrit 39.0 - 52.0 % 34.0  34.9  29.5   Platelets 150 - 400 K/uL 130  218  203         Latest Ref Rng & Units 01/12/2022   10:37 AM 12/28/2021   11:15 AM 12/08/2021   11:04 AM  CMP  Glucose 70 - 99 mg/dL 125  113  126   BUN 8 - 23 mg/dL $Remove'12  14  12   'jAcFjBF$ Creatinine 0.61 - 1.24 mg/dL 0.97  1.03  1.00   Sodium 135 - 145 mmol/L 143  144  144   Potassium 3.5 - 5.1 mmol/L 3.6  4.0  3.9   Chloride 98 - 111 mmol/L 113  113  114   CO2 22 - 32 mmol/L $RemoveB'25  28  27   'IQlPdJnY$ Calcium 8.9 - 10.3 mg/dL 8.3  8.2  8.3   Total Protein 6.5 - 8.1 g/dL 5.4  5.6  5.5   Total Bilirubin 0.3 - 1.2 mg/dL 0.9  0.8  0.8   Alkaline Phos 38 - 126 U/L 93  100  143   AST 15 - 41 U/L $Remo'19  19  13   'gNwLa$ ALT 0 - 44 U/L $Remo'22  19  25       'bSMTu$ RADIOGRAPHIC STUDIES: I have personally reviewed the radiological images as listed and agreed with the findings in the report. No results found.    Orders Placed This Encounter  Procedures   CBC with Differential ([facility] Only)    Standing Status:   Future    Standing Expiration Date:   02/17/2023   CMP ([facility] only)    Standing Status:   Future    Standing Expiration Date:   02/17/2023   CBC with Differential ([facility] Only)    Standing Status:   Future    Standing Expiration Date:   03/03/2023   CMP ([facility] only)    Standing Status:   Future    Standing Expiration Date:   03/03/2023  All questions were answered. The patient knows to call the clinic with any problems, questions or concerns. No barriers to learning was detected. The total time spent in the appointment was 30 minutes.     [name], MD 01/12/2022   I, Wilburn Mylar, am acting as scribe for [name], MD.   I have reviewed the above documentation for accuracy and completeness, and I agree with the above.

## 2022-01-12 NOTE — Progress Notes (Signed)
Per Dr. Burr Medico, okay to treat with [place] 1.3.

## 2022-01-13 ENCOUNTER — Other Ambulatory Visit: Payer: Self-pay | Admitting: Hematology

## 2022-01-13 ENCOUNTER — Other Ambulatory Visit: Payer: Self-pay

## 2022-01-14 ENCOUNTER — Inpatient Hospital Stay: Payer: Medicare Other

## 2022-01-14 VITALS — BP 132/60 | HR 76 | Temp 98.6°F | Resp 14 | Ht 66.0 in | Wt 200.0 lb

## 2022-01-14 DIAGNOSIS — C170 Malignant neoplasm of duodenum: Secondary | ICD-10-CM

## 2022-01-14 DIAGNOSIS — Z5111 Encounter for antineoplastic chemotherapy: Secondary | ICD-10-CM | POA: Diagnosis not present

## 2022-01-14 DIAGNOSIS — D500 Iron deficiency anemia secondary to blood loss (chronic): Secondary | ICD-10-CM

## 2022-01-14 MED ORDER — SODIUM CHLORIDE 0.9% FLUSH
10.0000 mL | INTRAVENOUS | Status: DC | PRN
  Administered 2022-01-14: 10 mL

## 2022-01-14 MED ORDER — PEGFILGRASTIM-CBQV 6 MG/0.6ML ~~LOC~~ SOSY
6.0000 mg | PREFILLED_SYRINGE | Freq: Once | SUBCUTANEOUS | Status: AC
  Administered 2022-01-14: 6 mg via SUBCUTANEOUS
  Filled 2022-01-14: qty 0.6

## 2022-01-14 MED ORDER — HEPARIN SOD (PORK) LOCK FLUSH 100 UNIT/ML IV SOLN
500.0000 [IU] | Freq: Once | INTRAVENOUS | Status: AC | PRN
  Administered 2022-01-14: 500 [IU]

## 2022-01-14 NOTE — Patient Instructions (Signed)

## 2022-01-25 MED FILL — Dexamethasone Sodium Phosphate Inj 100 MG/10ML: INTRAMUSCULAR | Qty: 1 | Status: AC

## 2022-01-26 ENCOUNTER — Encounter: Payer: Self-pay | Admitting: Hematology

## 2022-01-26 ENCOUNTER — Inpatient Hospital Stay: Payer: Medicare Other | Attending: Nurse Practitioner

## 2022-01-26 ENCOUNTER — Inpatient Hospital Stay (HOSPITAL_BASED_OUTPATIENT_CLINIC_OR_DEPARTMENT_OTHER): Payer: Medicare Other | Admitting: Hematology

## 2022-01-26 ENCOUNTER — Inpatient Hospital Stay: Payer: Medicare Other

## 2022-01-26 ENCOUNTER — Other Ambulatory Visit: Payer: Self-pay | Admitting: Hematology

## 2022-01-26 VITALS — BP 131/54 | HR 66 | Temp 97.8°F | Resp 16 | Ht 66.0 in | Wt 198.9 lb

## 2022-01-26 DIAGNOSIS — Z8546 Personal history of malignant neoplasm of prostate: Secondary | ICD-10-CM | POA: Diagnosis not present

## 2022-01-26 DIAGNOSIS — I10 Essential (primary) hypertension: Secondary | ICD-10-CM | POA: Insufficient documentation

## 2022-01-26 DIAGNOSIS — Z1509 Genetic susceptibility to other malignant neoplasm: Secondary | ICD-10-CM | POA: Insufficient documentation

## 2022-01-26 DIAGNOSIS — Z7902 Long term (current) use of antithrombotics/antiplatelets: Secondary | ICD-10-CM | POA: Insufficient documentation

## 2022-01-26 DIAGNOSIS — Z9049 Acquired absence of other specified parts of digestive tract: Secondary | ICD-10-CM | POA: Insufficient documentation

## 2022-01-26 DIAGNOSIS — Z5111 Encounter for antineoplastic chemotherapy: Secondary | ICD-10-CM | POA: Diagnosis present

## 2022-01-26 DIAGNOSIS — Z8673 Personal history of transient ischemic attack (TIA), and cerebral infarction without residual deficits: Secondary | ICD-10-CM | POA: Insufficient documentation

## 2022-01-26 DIAGNOSIS — C170 Malignant neoplasm of duodenum: Secondary | ICD-10-CM

## 2022-01-26 DIAGNOSIS — Z9884 Bariatric surgery status: Secondary | ICD-10-CM | POA: Diagnosis not present

## 2022-01-26 DIAGNOSIS — D508 Other iron deficiency anemias: Secondary | ICD-10-CM | POA: Diagnosis present

## 2022-01-26 DIAGNOSIS — Z90411 Acquired partial absence of pancreas: Secondary | ICD-10-CM | POA: Diagnosis not present

## 2022-01-26 DIAGNOSIS — Z923 Personal history of irradiation: Secondary | ICD-10-CM | POA: Diagnosis not present

## 2022-01-26 DIAGNOSIS — Z7982 Long term (current) use of aspirin: Secondary | ICD-10-CM | POA: Diagnosis not present

## 2022-01-26 DIAGNOSIS — Z79899 Other long term (current) drug therapy: Secondary | ICD-10-CM | POA: Insufficient documentation

## 2022-01-26 DIAGNOSIS — E876 Hypokalemia: Secondary | ICD-10-CM | POA: Diagnosis not present

## 2022-01-26 DIAGNOSIS — Z95828 Presence of other vascular implants and grafts: Secondary | ICD-10-CM

## 2022-01-26 LAB — CBC WITH DIFFERENTIAL (CANCER CENTER ONLY)
Abs Immature Granulocytes: 0.11 10*3/uL — ABNORMAL HIGH (ref 0.00–0.07)
Basophils Absolute: 0 10*3/uL (ref 0.0–0.1)
Basophils Relative: 0 %
Eosinophils Absolute: 0.1 10*3/uL (ref 0.0–0.5)
Eosinophils Relative: 2 %
HCT: 36.9 % — ABNORMAL LOW (ref 39.0–52.0)
Hemoglobin: 11.6 g/dL — ABNORMAL LOW (ref 13.0–17.0)
Immature Granulocytes: 1 %
Lymphocytes Relative: 19 %
Lymphs Abs: 1.4 10*3/uL (ref 0.7–4.0)
MCH: 25.3 pg — ABNORMAL LOW (ref 26.0–34.0)
MCHC: 31.4 g/dL (ref 30.0–36.0)
MCV: 80.4 fL (ref 80.0–100.0)
Monocytes Absolute: 0.8 10*3/uL (ref 0.1–1.0)
Monocytes Relative: 11 %
Neutro Abs: 5.1 10*3/uL (ref 1.7–7.7)
Neutrophils Relative %: 67 %
Platelet Count: 140 10*3/uL — ABNORMAL LOW (ref 150–400)
RBC: 4.59 MIL/uL (ref 4.22–5.81)
RDW: 19.9 % — ABNORMAL HIGH (ref 11.5–15.5)
WBC Count: 7.6 10*3/uL (ref 4.0–10.5)
nRBC: 0.5 % — ABNORMAL HIGH (ref 0.0–0.2)

## 2022-01-26 LAB — CMP (CANCER CENTER ONLY)
ALT: 25 U/L (ref 0–44)
AST: 23 U/L (ref 15–41)
Albumin: 3.1 g/dL — ABNORMAL LOW (ref 3.5–5.0)
Alkaline Phosphatase: 112 U/L (ref 38–126)
Anion gap: 3 — ABNORMAL LOW (ref 5–15)
BUN: 16 mg/dL (ref 8–23)
CO2: 29 mmol/L (ref 22–32)
Calcium: 8.4 mg/dL — ABNORMAL LOW (ref 8.9–10.3)
Chloride: 112 mmol/L — ABNORMAL HIGH (ref 98–111)
Creatinine: 1.02 mg/dL (ref 0.61–1.24)
GFR, Estimated: >60 mL/min (ref >60)
Glucose, Bld: 110 mg/dL — ABNORMAL HIGH (ref 70–99)
Potassium: 3.8 mmol/L (ref 3.5–5.1)
Sodium: 144 mmol/L (ref 135–145)
Total Bilirubin: 0.8 mg/dL (ref 0.3–1.2)
Total Protein: 5.7 g/dL — ABNORMAL LOW (ref 6.5–8.1)

## 2022-01-26 LAB — CEA (IN HOUSE-CHCC): CEA (CHCC-In House): 8.02 ng/mL — ABNORMAL HIGH (ref 0.00–5.00)

## 2022-01-26 MED ORDER — OXALIPLATIN CHEMO INJECTION 100 MG/20ML
70.0000 mg/m2 | Freq: Once | INTRAVENOUS | Status: AC
  Administered 2022-01-26: 150 mg via INTRAVENOUS
  Filled 2022-01-26: qty 20

## 2022-01-26 MED ORDER — LEUCOVORIN CALCIUM INJECTION 350 MG
400.0000 mg/m2 | Freq: Once | INTRAVENOUS | Status: AC
  Administered 2022-01-26: 844 mg via INTRAVENOUS
  Filled 2022-01-26: qty 42.2

## 2022-01-26 MED ORDER — SODIUM CHLORIDE 0.9 % IV SOLN
2375.0000 mg/m2 | INTRAVENOUS | Status: DC
  Administered 2022-01-26: 5000 mg via INTRAVENOUS
  Filled 2022-01-26: qty 100

## 2022-01-26 MED ORDER — SODIUM CHLORIDE 0.9 % IV SOLN
10.0000 mg | Freq: Once | INTRAVENOUS | Status: AC
  Administered 2022-01-26: 10 mg via INTRAVENOUS
  Filled 2022-01-26: qty 10

## 2022-01-26 MED ORDER — SODIUM CHLORIDE 0.9% FLUSH
10.0000 mL | INTRAVENOUS | Status: AC | PRN
  Administered 2022-01-26: 10 mL

## 2022-01-26 MED ORDER — DEXTROSE 5 % IV SOLN: Freq: Once | INTRAVENOUS | Status: AC

## 2022-01-26 MED ORDER — PALONOSETRON HCL INJECTION 0.25 MG/5ML
0.2500 mg | Freq: Once | INTRAVENOUS | Status: AC
  Administered 2022-01-26: 0.25 mg via INTRAVENOUS
  Filled 2022-01-26: qty 5

## 2022-01-26 NOTE — Patient Instructions (Addendum)
[facility]   Discharge Instructions: Thank you for choosing [facility] to provide your oncology and hematology care.   If you have a lab appointment with the [facility], please go directly to the [facility] and check in at the registration area.   Wear comfortable clothing and clothing appropriate for easy access to any Portacath or PICC line.   We strive to give you quality time with your provider. You may need to reschedule your appointment if you arrive late (15 or more minutes).  Arriving late affects you and other patients whose appointments are after yours.  Also, if you miss three or more appointments without notifying the office, you may be dismissed from the clinic at the provider's discretion.      For prescription refill requests, have your pharmacy contact our office and allow 72 hours for refills to be completed.    Today you received the following chemotherapy and/or immunotherapy agents: Oxaliplatin, Leucovorin, and Fluorouracil (Adrucil)      To help prevent nausea and vomiting after your treatment, we encourage you to take your nausea medication as directed.  BELOW ARE SYMPTOMS THAT SHOULD BE REPORTED IMMEDIATELY: *FEVER GREATER THAN 100.4 F (38 C) OR HIGHER *CHILLS OR SWEATING *NAUSEA AND VOMITING THAT IS NOT CONTROLLED WITH YOUR NAUSEA MEDICATION *UNUSUAL SHORTNESS OF BREATH *UNUSUAL BRUISING OR BLEEDING *URINARY PROBLEMS (pain or burning when urinating, or frequent urination) *BOWEL PROBLEMS (unusual diarrhea, constipation, pain near the anus) TENDERNESS IN MOUTH AND THROAT WITH OR WITHOUT PRESENCE OF ULCERS (sore throat, sores in mouth, or a toothache) UNUSUAL RASH, SWELLING OR PAIN  UNUSUAL VAGINAL DISCHARGE OR ITCHING   Items with * indicate a potential emergency and should be followed up as soon as possible or go to the Emergency Department if any problems should occur.  Please show the CHEMOTHERAPY ALERT CARD  or IMMUNOTHERAPY ALERT CARD at check-in to the Emergency Department and triage nurse.  Should you have questions after your visit or need to cancel or reschedule your appointment, please contact [facility]  Dept: [phone]  and follow the prompts.  Office hours are 8:00 a.m. to 4:30 p.m. Monday - Friday. Please note that voicemails left after 4:00 p.m. may not be returned until the following business day.  We are closed weekends and major holidays. You have access to a nurse at all times for urgent questions. Please call the main number to the clinic Dept: [phone] and follow the prompts.   For any non-urgent questions, you may also contact your provider using MyChart. We now offer e-Visits for anyone 93 and older to request care online for non-urgent symptoms. For details visit [email or web address].   Also download the MyChart app! Go to the app store, search "MyChart", open the app, select [facility], and log in with your MyChart username and password.  Masks are optional in the cancer centers. If you would like for your care team to wear a mask while they are taking care of you, please let them know. You may have one support person who is at least 78 years old accompany you for your appointments.  The chemotherapy medication bag should finish at 46 hours, 96 hours, or 7 days. For example, if your pump is scheduled for 46 hours and it was put on at 4:00 p.m., it should finish at 2:00 p.m. the day it is scheduled to come off regardless of your appointment time.     Estimated  time to finish at 11/11 @ 12:30 PM .   If the display on your pump reads "Low Volume" and it is beeping, take the batteries out of the pump and come to the cancer center for it to be taken off.   If the pump alarms go off prior to the pump reading "Low Volume" then call [phone] and someone can assist you.  If the plunger comes out and the chemotherapy medication is leaking out,  please use your home chemo spill kit to clean up the spill. Do NOT use paper towels or other household products.  If you have problems or questions regarding your pump, please call either [phone] (24 hours a day) or the cancer center Monday-Friday 8:00 a.m.- 4:30 p.m. at the clinic number and we will assist you. If you are unable to get assistance, then go to the nearest Emergency Department and ask the staff to contact the IV team for assistance.

## 2022-01-26 NOTE — Progress Notes (Signed)
[place]   [phone]) [phone] [phone]   Clinic Follow up Note   Patient Care Team: Biagio Borg, MD as PCP - General Stanford Breed, Denice Bors, MD as PCP - Cardiology (Cardiology) [name], MD as Consulting Physician (Gastroenterology) [name], MD as Consulting Physician (Orthopedic Surgery) [name], MD as Consulting Physician (Ophthalmology) [name], MD as Consulting Physician (Oncology)  Date of Service:  01/26/2022  CHIEF COMPLAINT: f/u of duodenal cancer  CURRENT THERAPY:  FOLFOX, q14d, starting 11/24/21   ASSESSMENT & PLAN:  Aaron Moss is a 76 y.o. male with   1. 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 Treatment delayed due to his stroke on 04/18/21. S/p 8 cycles neoadjuvant Keytruda 05/11/21 - 09/28/21. He tolerated well with mild fatigue.  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 -He began FOLFOX on 11/24/21. He has tolerated well overall with some taste changes and mild fatigue -labs reviewed, improved-- ANC WNL, hgb 11.6. Adequate to proceed with C5 today. -plan to repeat CT after cycle 6; I ordered today.   2. Chemo toxicities: Taste change, Cold sensitivity -he has lost 7 lbs since starting FOLFOX. He reports taste change from chemo. -weight is stable lately. -he has developed cold sensitivity, denies persistent numbness. Will monitor.   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 -he is on oral iron.   4. Lynch Syndrome, H/o prostate (2015) and colon (2000) cancers -he has a personal history of stage pT3N0 colon cancer in 2000 and prostate cancer dx in 2015, s/p 8 weeks of IMRT with Dr. Valere Dross. -genetic testing and counseling on 04/07/21 confirmed a PMS2 mutation, as well as VUS in  ATM, BLM, and SDHA.   5. Comorbidities: TIA, s/p left endarterectomy carotid on 04/25/21; Hypokalemia; HTN -TIA on 04/18/21, on aspirin and Plavix, no residual neurodeficits -he has chronic low potassium, on KCL.     Plan -proceed with C5 FOLFOX today at same dose  -lab, flush, f/u, and FOLFOX in 3, 5, and 7 weeks -restaging CT to be done in 4-5 weeks   No problem-specific Assessment & Plan notes found for this encounter.   SUMMARY OF ONCOLOGIC HISTORY: Oncology History  Adenocarcinoma of duodenum ([place])  03/01/2021 Imaging   EXAM: ABDOMEN ULTRASOUND COMPLETE  IMPRESSION: 1. Simple appearing right renal cyst. 2. Otherwise negative abdominal ultrasound   03/18/2021 Procedure   Upper Endoscopy/Colonoscopy, Dr. Loletha Carrow  Upper Endoscopy Impression: - Normal esophagus. - A single submucosal papule (nodule) found in the stomach. - Duodenal mass. Biopsied. Concerning for malignancy.  Colonoscopy Impression: - Diverticulosis in the entire examined colon. - The examined portion of the ileum was normal. - Patent end-to-end colo-colonic anastomosis, characterized by healthy appearing mucosa. - Internal hemorrhoids. - No specimens collected.   03/18/2021 Initial Biopsy   Diagnosis Duodenum, Biopsy - INVASIVE MODERATE TO POORLY DIFFERENTIATED ADENOCARCINOMA   03/18/2021 Cancer Staging   Staging form: Small Intestine - Adenocarcinoma, AJCC 8th Edition - Clinical stage from 03/18/2021: Stage IIIA (cTX, cN1, cM0) - Signed by [name], MD on 06/22/2021 Stage prefix: Initial diagnosis   03/22/2021 Initial Diagnosis   Adenocarcinoma of duodenum ([place])   04/01/2021 Imaging   EXAM: CT CHEST, ABDOMEN, AND PELVIS WITH CONTRAST  IMPRESSION: 1. Circumferential wall thickening of the first portion of the duodenum,  in keeping with known primary malignancy. 2. Enlarged, hypodense lymph nodes anterior to the pancreatic head and in the portacaval station, concerning for nodal  metastatic disease. 3. No other evidence of metastatic disease in the chest, abdomen, or pelvis. 4. Incidental note of a fluid attenuation lesion within the proximal pancreatic body measuring 1.9 x 1.4 cm, with prominence of the pancreatic duct distally, up to 0.6 cm. This lesion was present on remote prior examination dated 05/04/2009, and has slightly increased in size over a long period of time. This is consistent with a small IPMN and benign given indolent behavior over greater than 10 years. 5. Background of very fine centrilobular pulmonary nodules, most concentrated in the lung apices, most commonly seen in smoking-related respiratory bronchiolitis. 6. Coronary artery disease.   Aortic Atherosclerosis (ICD10-I70.0).    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 Of note, a variant of uncertain significance was detected in the ATM (p.A869G), BLM (c.800-3T>G), and SDHA (p.V632F) genes. Report date is 04/27/2021.  The CancerNext-Expanded gene panel offered by [facility] and includes sequencing, rearrangement, and RNA analysis for the following 77 genes: AIP, ALK, APC, ATM, AXIN2, BAP1, BARD1, BLM, BMPR1A, BRCA1, BRCA2, BRIP1, CDC73, CDH1, CDK4, CDKN1B, CDKN2A, CHEK2, CTNNA1, DICER1, FANCC, FH, FLCN, GALNT12, KIF1B, LZTR1, MAX, MEN1, MET, MLH1, MSH2, MSH3, MSH6, MUTYH, NBN, NF1, NF2, NTHL1, PALB2, PHOX2B, PMS2, POT1, PRKAR1A, PTCH1, PTEN, RAD51C, RAD51D, RB1, RECQL, RET, SDHA, SDHAF2, SDHB, SDHC, SDHD, SMAD4, SMARCA4, SMARCB1, SMARCE1, STK11, SUFU, TMEM127, TP53, TSC1, TSC2, VHL and XRCC2 (sequencing and deletion/duplication); EGFR, EGLN1, HOXB13, KIT, MITF, PDGFRA, POLD1, and POLE (sequencing only); EPCAM and GREM1 (deletion/duplication only).    05/11/2021 - 09/28/2021 Chemotherapy   Patient is on Treatment Plan : COLORECTAL Pembrolizumab (200) q21d     11/24/2021 -  Chemotherapy    Patient is on Treatment Plan : COLORECTAL FOLFOX q14d x 4 months        INTERVAL HISTORY:  Aaron Moss is here for a follow up of duodenal cancer. He was last seen by me on 01/12/22. He presents to the clinic accompanied by his daughter. He reports he is stable. He denies numbness, reports only cold sensitivity.   All other systems were reviewed with the patient and are negative.  MEDICAL HISTORY:  Past Medical History:  Diagnosis Date   Anemia 10/31/2021   Dx IDA   Carotid stenosis, left    Colon cancer ([place]) 2000   Coronary artery disease    per Dr. Jacalyn Lefevre note from 04/13/21   Diverticulosis of colon    Duodenal cancer ([place]) 01/2021   GERD (gastroesophageal reflux disease)    Glaucoma    History of chemotherapy 2000   colon cancer   History of radiation therapy 08/06/13- 10/03/13   prostate 7800 cGy 40 sessions, seminal vesicles 5600 cGy 40 sessions   Hyperlipidemia    Hypertension    Left knee DJD    Prostate cancer ([place]) 05/23/2013   gleason 3+4=7, volume 11.5 ml   Stroke ([place]) 04/18/2021   TIA (transient ischemic attack)     SURGICAL HISTORY: Past Surgical History:  Procedure Laterality Date   CHOLECYSTECTOMY N/A 10/19/2021   Procedure: CHOLECYSTECTOMY;  Surgeon: [name], MD;  Location: [place];  Service: General;  Laterality: N/A;   COLON SURGERY  2000   colon cancer   COLONOSCOPY     ENDARTERECTOMY Left 04/25/2021   Procedure: LEFT ENDARTERECTOMY CAROTID;  Surgeon: Marty Heck, MD;  Location: [place];  Service: Vascular;  Laterality: Left;   EUS     pancreatic cyst   KNEE ARTHROSCOPY  2007   LT- [facility]   LAPAROSCOPY N/A 10/19/2021   Procedure: LAPAROSCOPY DIAGNOSTIC;  Surgeon: [name], MD;  Location: [place];  Service: General;  Laterality: N/A;  GENERAL AND TAP BLOCK   PATCH ANGIOPLASTY Left 04/25/2021   Procedure: PATCH ANGIOPLASTY XENOSURE 1CM X 6CM;  Surgeon: Marty Heck, MD;  Location: [place];  Service: Vascular;  Laterality:  Left;   PORTACATH PLACEMENT Left 11/17/2021   Procedure: PORT PLACEMENT;  Surgeon: [name], MD;  Location: [place];  Service: General;  Laterality: Left;   PROSTATE BIOPSY  05/23/13   gleason 7, volume 11.5 ml   WHIPPLE PROCEDURE N/A 10/19/2021   Procedure: ATTEMPTED WHIPPLE PROCEDURE  WITH GASTRODUODENAL BYPASS;  Surgeon: [name], MD;  Location: [place];  Service: General;  Laterality: N/A;  GENERAL AND TAP BLOCK    I have reviewed the social history and family history with the patient and they are unchanged from previous note.  ALLERGIES:  is allergic to lyrica [pregabalin].  MEDICATIONS:  Current Outpatient Medications  Medication Sig Dispense Refill   amLODipine (NORVASC) 10 MG tablet Take 1 tablet by mouth once daily 90 tablet 3   aspirin EC 81 MG EC tablet Take 1 tablet (81 mg total) by mouth daily. Swallow whole. 30 tablet 11   atorvastatin (LIPITOR) 40 MG tablet Take 1 tablet by mouth once daily 90 tablet 2   benazepril (LOTENSIN) 40 MG tablet Take 1 tablet by mouth once daily 90 tablet 3   carvedilol (COREG) 12.5 MG tablet Take 12.5 mg by mouth 2 (two) times daily.     iron polysaccharides (FERREX 150) 150 MG capsule Take 1 capsule by mouth twice daily 180 capsule 0   lidocaine-prilocaine (EMLA) cream Apply to affected area once 30 g 3   oxyCODONE (OXY IR/ROXICODONE) 5 MG immediate release tablet Take 1 tablet (5 mg total) by mouth every 6 (six) hours as needed for severe pain. 5 tablet 0   pantoprazole (PROTONIX) 40 MG tablet Take 1 tablet (40 mg total) by mouth daily. 90 tablet 3   potassium chloride 20 MEQ TBCR Take 20 mEq by mouth 3 (three) times daily. 90 tablet 0   prochlorperazine (COMPAZINE) 10 MG tablet Take 1 tablet (10 mg total) by mouth every 6 (six) hours as needed for nausea or vomiting (Use for nausea and / or vomiting unresolved with ondansetron (Zofran).). (Patient not taking: Reported on 11/16/2021) 30 tablet 0   traMADol (ULTRAM) 50 MG tablet TAKE 1 TABLET BY  MOUTH EVERY 6 HOURS AS NEEDED FOR PAIN 120 tablet 1   Travoprost, BAK Free, (TRAVATAN) 0.004 % SOLN ophthalmic solution Place 1 drop into both eyes at bedtime.     VITAMIN D PO Take 2 tablets by mouth daily. Gummies     No current facility-administered medications for this visit.    PHYSICAL EXAMINATION: ECOG PERFORMANCE STATUS: 1 - Symptomatic but completely ambulatory  Vitals:   01/26/22 1032  BP: (!) 131/54  Pulse: 66  Resp: 16  Temp: 97.8 F (36.6 C)  SpO2: 100%   Wt Readings from Last 3 Encounters:  01/26/22 198 lb 14.4 oz (90.2 kg)  01/14/22 200 lb (90.7 kg)  01/12/22 200 lb (90.7 kg)     GENERAL:alert, no distress and comfortable SKIN: skin color normal, no rashes or significant  lesions EYES: normal, Conjunctiva are pink and non-injected, sclera clear  NEURO: alert & oriented x 3 with fluent speech  LABORATORY DATA:  I have reviewed the data as listed    Latest Ref Rng & Units 01/26/2022   10:11 AM 01/12/2022   10:37 AM 12/28/2021   11:15 AM  CBC  WBC 4.0 - 10.5 K/uL 7.6  3.0  5.1   Hemoglobin 13.0 - 17.0 g/dL 11.6  10.6  10.9   Hematocrit 39.0 - 52.0 % 36.9  34.0  34.9   Platelets 150 - 400 K/uL 140  130  218         Latest Ref Rng & Units 01/26/2022   10:11 AM 01/12/2022   10:37 AM 12/28/2021   11:15 AM  CMP  Glucose 70 - 99 mg/dL 110  125  113   BUN 8 - 23 mg/dL _0 Creatinine 0.61 - 1.24 mg/dL 1.02  0.97  1.03   Sodium 135 - 145 mmol/L 144  143  144   Potassium 3.5 - 5.1 mmol/L 3.8  3.6  4.0   Chloride 98 - 111 mmol/L 112  113  113   CO2 22 - 32 mmol/L _1 Calcium 8.9 - 10.3 mg/dL 8.4  8.3  8.2   Total Protein 6.5 - 8.1 g/dL 5.7  5.4  5.6   Total Bilirubin 0.3 - 1.2 mg/dL 0.8  0.9  0.8   Alkaline Phos 38 - 126 U/L 112  93  100   AST 15 - 41 U/L _2 ALT 0 - 44 U/L _3 RADIOGRAPHIC STUDIES: I have personally reviewed the radiological images as listed and agreed with the findings in the report. No  results found.    Orders Placed This Encounter  Procedures   CT ABDOMEN PELVIS W CONTRAST    Standing Status:   Future    Standing Expiration Date:   01/27/2023    Order Specific Question:   If indicated for the ordered procedure, I authorize the administration of contrast media per Radiology protocol    Answer:   Yes    Order Specific Question:   Preferred imaging location?    Answer:   [facility]    Order Specific Question:   Release to patient    Answer:   Immediate    Order Specific Question:   Is Oral Contrast requested for this exam?    Answer:   Yes, Per Radiology protocol   CBC with Differential ([place] Only)    Standing Status:   Future    Standing Expiration Date:   03/24/2023   CMP ([place] only)    Standing Status:   Future    Standing Expiration Date:   03/24/2023   All questions were answered. The patient knows to call the clinic with any problems, questions or concerns. No barriers to learning was detected. The total time spent in the appointment was 30 minutes.     [name], MD 01/26/2022   I, Wilburn Mylar, am acting as scribe for [name], MD.   I have reviewed the above documentation for accuracy and completeness, and I agree with the above.

## 2022-01-28 ENCOUNTER — Inpatient Hospital Stay: Payer: Medicare Other

## 2022-01-28 VITALS — BP 116/55 | HR 68 | Temp 97.9°F | Resp 18 | Ht 66.0 in

## 2022-01-28 DIAGNOSIS — C170 Malignant neoplasm of duodenum: Secondary | ICD-10-CM

## 2022-01-28 DIAGNOSIS — Z5111 Encounter for antineoplastic chemotherapy: Secondary | ICD-10-CM | POA: Diagnosis not present

## 2022-01-28 MED ORDER — SODIUM CHLORIDE 0.9% FLUSH
10.0000 mL | INTRAVENOUS | Status: DC | PRN
  Administered 2022-01-28: 10 mL

## 2022-01-28 MED ORDER — HEPARIN SOD (PORK) LOCK FLUSH 100 UNIT/ML IV SOLN
500.0000 [IU] | Freq: Once | INTRAVENOUS | Status: AC | PRN
  Administered 2022-01-28: 500 [IU]

## 2022-01-28 NOTE — Patient Instructions (Signed)

## 2022-01-29 ENCOUNTER — Other Ambulatory Visit: Payer: Self-pay

## 2022-01-31 ENCOUNTER — Ambulatory Visit (INDEPENDENT_AMBULATORY_CARE_PROVIDER_SITE_OTHER): Payer: Medicare Other | Admitting: Internal Medicine

## 2022-01-31 ENCOUNTER — Encounter: Payer: Self-pay | Admitting: Internal Medicine

## 2022-01-31 VITALS — BP 118/60 | HR 67 | Temp 98.5°F | Ht 66.0 in | Wt 197.0 lb

## 2022-01-31 DIAGNOSIS — R739 Hyperglycemia, unspecified: Secondary | ICD-10-CM

## 2022-01-31 DIAGNOSIS — Z125 Encounter for screening for malignant neoplasm of prostate: Secondary | ICD-10-CM

## 2022-01-31 DIAGNOSIS — I10 Essential (primary) hypertension: Secondary | ICD-10-CM

## 2022-01-31 DIAGNOSIS — D500 Iron deficiency anemia secondary to blood loss (chronic): Secondary | ICD-10-CM

## 2022-01-31 DIAGNOSIS — E7800 Pure hypercholesterolemia, unspecified: Secondary | ICD-10-CM | POA: Diagnosis not present

## 2022-01-31 DIAGNOSIS — Z23 Encounter for immunization: Secondary | ICD-10-CM

## 2022-01-31 DIAGNOSIS — E559 Vitamin D deficiency, unspecified: Secondary | ICD-10-CM

## 2022-01-31 DIAGNOSIS — E538 Deficiency of other specified B group vitamins: Secondary | ICD-10-CM

## 2022-01-31 MED ORDER — POLYSACCHARIDE IRON COMPLEX 150 MG PO CAPS: 150.0000 mg | ORAL_CAPSULE | Freq: Two times a day (BID) | ORAL | 1 refills | Status: DC

## 2022-01-31 NOTE — Assessment & Plan Note (Signed)
Persistent, for contd oral replaement

## 2022-01-31 NOTE — Patient Instructions (Addendum)
Please have your Shingrix (shingles) shots done at your local pharmacy.  You had the flu shot today  Please watch for low blood pressures at home if you lose more weight, especially if you have dizziness or weakness or falls  Please continue all other medications as before, and refills have been done if requested - iron supplement  Please have the pharmacy call with any other refills you may need.  Please continue your efforts at being more active, low cholesterol diet, and weight control.  Please keep your appointments with your specialists as you may have planned  Please make an Appointment to return in 6 months, or sooner if needed, also with Lab Appointment for testing done 3-5 days before at the [facility] (so this is for TWO appointments - please see the scheduling desk as you leave)

## 2022-01-31 NOTE — Assessment & Plan Note (Signed)
Lab Results  Component Value Date   HGBA1C 5.6 10/12/2021   Stable, pt to continue current medical treatment  - diet, wt control, excercise

## 2022-01-31 NOTE — Assessment & Plan Note (Signed)
BP Readings from Last 3 Encounters:  01/31/22 118/60  01/28/22 (!) 116/55  01/26/22 (!) 131/54   Low normal, tolerating well, but may need to reduce BP meds if wt continues to decrease,  pt to continue medical treatment norvasc 10 mg qd, lotensin 40 mg qd, coreg 12.5 mg bid

## 2022-01-31 NOTE — Progress Notes (Signed)
Chief Complaint: follow up HTN, DM, iron deficiency anemia       HPI:  Aaron Moss is a 76 y.o. male here with hx of [place] wt about 247 approx 1 yr ago., now with significant wt loss.  Pt denies chest pain, increased sob or doe, wheezing, orthopnea, PND, increased LE swelling, palpitations, dizziness or syncope.   Pt denies polydipsia, polyuria, or new focal neuro s/s.    Pt denies fever, wt loss, night sweats, loss of appetite, or other constitutional symptoms   No overt bleeding but has persistent low iron indices.  For flu shot Wt Readings from Last 3 Encounters:  01/31/22 197 lb (89.4 kg)  01/26/22 198 lb 14.4 oz (90.2 kg)  01/14/22 200 lb (90.7 kg)   BP Readings from Last 3 Encounters:  01/31/22 118/60  01/28/22 (!) 116/55  01/26/22 (!) 131/54         Past Medical History:  Diagnosis Date   Anemia 10/31/2021   Dx IDA   Carotid stenosis, left    Colon cancer ([place]) 2000   Coronary artery disease    per Dr. Jacalyn Lefevre note from 04/13/21   Diverticulosis of colon    Duodenal cancer ([place]) 01/2021   GERD (gastroesophageal reflux disease)    Glaucoma    History of chemotherapy 2000   colon cancer   History of radiation therapy 08/06/13- 10/03/13   prostate 7800 cGy 40 sessions, seminal vesicles 5600 cGy 40 sessions   Hyperlipidemia    Hypertension    Left knee DJD    Prostate cancer ([place]) 05/23/2013   gleason 3+4=7, volume 11.5 ml   Stroke ([place]) 04/18/2021   TIA (transient ischemic attack)    Past Surgical History:  Procedure Laterality Date   CHOLECYSTECTOMY N/A 10/19/2021   Procedure: CHOLECYSTECTOMY;  Surgeon: [name], MD;  Location: [place];  Service: General;  Laterality: N/A;   COLON SURGERY  2000   colon cancer   COLONOSCOPY     ENDARTERECTOMY Left 04/25/2021   Procedure: LEFT ENDARTERECTOMY CAROTID;  Surgeon: Marty Heck, MD;  Location: [place];  Service: Vascular;  Laterality: Left;   EUS     pancreatic cyst   KNEE ARTHROSCOPY  2007   LT- [facility]   LAPAROSCOPY N/A 10/19/2021   Procedure: LAPAROSCOPY DIAGNOSTIC;  Surgeon: [name], MD;  Location: [place];  Service: General;  Laterality: N/A;  GENERAL AND TAP BLOCK   PATCH ANGIOPLASTY Left 04/25/2021   Procedure: PATCH ANGIOPLASTY XENOSURE 1CM X 6CM;  Surgeon: Marty Heck, MD;  Location: [place];  Service: Vascular;  Laterality: Left;   PORTACATH PLACEMENT Left 11/17/2021   Procedure: PORT PLACEMENT;  Surgeon: [name], MD;  Location: [place];  Service: General;  Laterality: Left;   PROSTATE BIOPSY  05/23/13   gleason 7, volume 11.5 ml   WHIPPLE PROCEDURE N/A 10/19/2021   Procedure: ATTEMPTED WHIPPLE PROCEDURE  WITH GASTRODUODENAL BYPASS;  Surgeon: [name], MD;  Location: [place];  Service: General;  Laterality: N/A;  GENERAL AND TAP BLOCK    reports that he quit smoking about 33 years ago. His smoking use included cigarettes. He has a 22.00 pack-year smoking history. He has never used smokeless tobacco. He reports that he does not currently use alcohol. He reports that he does not use drugs. family history includes Breast cancer in his maternal grandmother; Cancer in his brother, maternal aunt, and mother; Cancer (age of onset: 75) in his daughter; Colon  cancer (age of onset: 61) in his sister; Diabetes in his brother. Allergies  Allergen Reactions   Lyrica [Pregabalin] Other (See Comments)    "Just made me feel bad"   Current Outpatient Medications on File Prior to Visit  Medication Sig Dispense Refill   amLODipine (NORVASC) 10 MG tablet Take 1 tablet by mouth once daily 90 tablet 3   aspirin EC 81 MG EC tablet Take 1 tablet (81 mg total) by mouth daily. Swallow whole. 30 tablet 11   atorvastatin (LIPITOR) 40 MG tablet Take 1 tablet by mouth once daily 90 tablet 2   benazepril (LOTENSIN) 40 MG tablet Take 1 tablet by mouth once daily 90 tablet 3   carvedilol (COREG) 12.5 MG tablet Take 12.5 mg by mouth 2 (two) times daily.     lidocaine-prilocaine (EMLA) cream Apply to  affected area once 30 g 3   oxyCODONE (OXY IR/ROXICODONE) 5 MG immediate release tablet Take 1 tablet (5 mg total) by mouth every 6 (six) hours as needed for severe pain. 5 tablet 0   pantoprazole (PROTONIX) 40 MG tablet Take 1 tablet (40 mg total) by mouth daily. 90 tablet 3   potassium chloride 20 MEQ TBCR Take 20 mEq by mouth 3 (three) times daily. 90 tablet 0   traMADol (ULTRAM) 50 MG tablet TAKE 1 TABLET BY MOUTH EVERY 6 HOURS AS NEEDED FOR PAIN 120 tablet 1   Travoprost, BAK Free, (TRAVATAN) 0.004 % SOLN ophthalmic solution Place 1 drop into both eyes at bedtime.     VITAMIN D PO Take 2 tablets by mouth daily. Gummies     KLOR-CON M20 20 MEQ tablet Take 20 mEq by mouth 3 (three) times daily.     prochlorperazine (COMPAZINE) 10 MG tablet Take 1 tablet (10 mg total) by mouth every 6 (six) hours as needed for nausea or vomiting (Use for nausea and / or vomiting unresolved with ondansetron (Zofran).). (Patient not taking: Reported on 11/16/2021) 30 tablet 0   No current facility-administered medications on file prior to visit.        ROS:  All others reviewed and negative.  Objective        PE:  BP 118/60 (BP Location: Right Arm, Patient Position: Sitting, Cuff Size: Large)   Pulse 67   Temp 98.5 F (36.9 C) (Oral)   Ht '5\' 6"'$  (1.676 m)   Wt 197 lb (89.4 kg)   SpO2 98%   BMI 31.80 kg/m                 Constitutional: Pt appears in NAD               HENT: Head: NCAT.                Right Ear: External ear normal.                 Left Ear: External ear normal.                Eyes: . Pupils are equal, round, and reactive to light. Conjunctivae and EOM are normal               Nose: without d/c or deformity               Neck: Neck supple. Gross normal ROM               Cardiovascular: Normal rate and regular rhythm.  Pulmonary/Chest: Effort normal and breath sounds without rales or wheezing.                Abd:  Soft, NT, ND, + BS, no organomegaly                Neurological: Pt is alert. At baseline orientation, motor grossly intact               Skin: Skin is warm. No rashes, no other new lesions, LE edema - none               Psychiatric: Pt behavior is normal without agitation   Micro: none  Cardiac tracings I have personally interpreted today:  none  Pertinent Radiological findings (summarize): none   Lab Results  Component Value Date   WBC 7.6 01/26/2022   HGB 11.6 (L) 01/26/2022   HCT 36.9 (L) 01/26/2022   PLT 140 (L) 01/26/2022   GLUCOSE 110 (H) 01/26/2022   CHOL 77 04/26/2021   TRIG 62 04/26/2021   HDL 20 (L) 04/26/2021   LDLDIRECT 185.6 05/15/2007   LDLCALC 45 04/26/2021   ALT 25 01/26/2022   AST 23 01/26/2022   NA 144 01/26/2022   K 3.8 01/26/2022   CL 112 (H) 01/26/2022   CREATININE 1.02 01/26/2022   BUN 16 01/26/2022   CO2 29 01/26/2022   TSH 1.547 11/24/2021   PSA 0.08 (L) 07/07/2020   INR 1.2 10/22/2021   HGBA1C 5.6 10/12/2021   Assessment/Plan:  Aaron Moss is a 76 y.o. Black or African American [2] male with  has a past medical history of Anemia (10/31/2021), Carotid stenosis, left, Colon cancer ([place]) (2000), Coronary artery disease, Diverticulosis of colon, Duodenal cancer ([place]) (01/2021), GERD (gastroesophageal reflux disease), Glaucoma, History of chemotherapy (2000), History of radiation therapy (08/06/13- 10/03/13), Hyperlipidemia, Hypertension, Left knee DJD, Prostate cancer ([place]) (05/23/2013), Stroke ([place]) (04/18/2021), and TIA (transient ischemic attack).  Hypertension, uncontrolled BP Readings from Last 3 Encounters:  01/31/22 118/60  01/28/22 (!) 116/55  01/26/22 (!) 131/54   Low normal, tolerating well, but may need to reduce BP meds if wt continues to decrease,  pt to continue medical treatment norvasc 10 mg qd, lotensin 40 mg qd, coreg 12.5 mg bid   Hyperlipidemia Lab Results  Component Value Date   LDLCALC 45 04/26/2021   Stable, pt to continue current statin lipitor 40 mg  qd   Hyperglycemia Lab Results  Component Value Date   HGBA1C 5.6 10/12/2021   Stable, pt to continue current medical treatment  - diet, wt control, excercise   Iron deficiency anemia Persistent, for contd oral replaement  Followup: Return in about 6 months (around 08/01/2022).  Cathlean Cower, MD 01/31/2022 8:36 PM [facility]

## 2022-01-31 NOTE — Assessment & Plan Note (Signed)
Lab Results  Component Value Date   LDLCALC 45 04/26/2021   Stable, pt to continue current statin lipitor 40 mg qd

## 2022-02-15 ENCOUNTER — Other Ambulatory Visit: Payer: Self-pay | Admitting: Internal Medicine

## 2022-02-15 MED FILL — Dexamethasone Sodium Phosphate Inj 100 MG/10ML: INTRAMUSCULAR | Qty: 1 | Status: AC

## 2022-02-15 NOTE — Telephone Encounter (Signed)
Please refill as per office routine med refill policy (all routine meds to be refilled for 3 mo or monthly (per pt preference) up to one year from last visit, then month to month grace period for 3 mo, then further med refills will have to be denied) ? ?

## 2022-02-15 NOTE — Progress Notes (Unsigned)
[place]   Telephone:(336) [phone] Fax:(336) [phone]   Clinic Follow up Note   Patient Care Team: Biagio Borg, MD as PCP - General Stanford Breed, Denice Bors, MD as PCP - Cardiology (Cardiology) [name], MD as Consulting Physician (Gastroenterology) [name], MD as Consulting Physician (Orthopedic Surgery) [name], MD as Consulting Physician (Ophthalmology) [name], MD as Consulting Physician (Oncology)  Date of Service:  02/16/2022  CHIEF COMPLAINT: f/u of duodenal cancer    CURRENT THERAPY: FOLFOX, q14d, starting 11/24/21      ASSESSMENT:  Aaron Moss is a 76 y.o. male with      1. 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 Treatment delayed due to his stroke on 04/18/21. S/p 8 cycles neoadjuvant Keytruda 05/11/21 - 09/28/21. He tolerated well with mild fatigue.  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 -He began FOLFOX on 11/24/21. He has tolerated well overall with some taste changes and mild fatigue -plan to repeat CT after cycle 6   2. Chemo toxicities: Taste change, Cold sensitivity -he has lost 7 lbs since starting FOLFOX. He reports taste change from chemo. -weight is stable lately. -he has developed cold sensitivity, denies persistent numbness. Will monitor.   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 -he is on oral iron.   4. Lynch Syndrome, H/o prostate (2015) and colon (2000) cancers -he has a personal history of stage pT3N0 colon cancer in 2000 and prostate cancer dx in 2015, s/p 8 weeks of IMRT with Dr. Valere Dross. -genetic testing and counseling on 04/07/21 confirmed a PMS2 mutation, as well as VUS in ATM, BLM, and SDHA.   5. Comorbidities: TIA, s/p left endarterectomy carotid on 04/25/21;  Hypokalemia; HTN -TIA on 04/18/21, on aspirin and Plavix, no residual neurodeficits -he has chronic low potassium, on KCL.  No problem-specific Assessment & Plan notes found for this encounter.     PLAN: -restaging CT scan scheduled for 12/4  -Adequate to proceed with C6 FOLFOX today -F/u phone visit 12/7 to review scan findings    SUMMARY OF ONCOLOGIC HISTORY: Oncology History  Adenocarcinoma of duodenum ([facility])  03/01/2021 Imaging   EXAM: ABDOMEN ULTRASOUND COMPLETE  IMPRESSION: 1. Simple appearing right renal cyst. 2. Otherwise negative abdominal ultrasound   03/18/2021 Procedure   Upper Endoscopy/Colonoscopy, Dr. Loletha Carrow  Upper Endoscopy Impression: - Normal esophagus. - A single submucosal papule (nodule) found in the stomach. - Duodenal mass. Biopsied. Concerning for malignancy.  Colonoscopy Impression: - Diverticulosis in the entire examined colon. - The examined portion of the ileum was normal. - Patent end-to-end colo-colonic anastomosis, characterized by healthy appearing mucosa. - Internal hemorrhoids. - No specimens collected.   03/18/2021 Initial Biopsy   Diagnosis Duodenum, Biopsy - INVASIVE MODERATE TO POORLY DIFFERENTIATED ADENOCARCINOMA   03/18/2021 Cancer Staging   Staging form: Small Intestine - Adenocarcinoma, AJCC 8th Edition - Clinical stage from 03/18/2021: Stage IIIA (cTX, cN1, cM0) - Signed by [name], MD on 06/22/2021 Stage prefix: Initial diagnosis   03/22/2021 Initial Diagnosis   Adenocarcinoma of duodenum ([facility])   04/01/2021 Imaging   EXAM: CT CHEST, ABDOMEN, AND PELVIS WITH CONTRAST  IMPRESSION: 1. Circumferential wall thickening of the first portion of the duodenum, in keeping with known primary malignancy. 2. Enlarged, hypodense lymph nodes anterior to the pancreatic head and  in the portacaval station, concerning for nodal metastatic disease. 3. No other evidence of metastatic disease in the chest, abdomen, or pelvis. 4.  Incidental note of a fluid attenuation lesion within the proximal pancreatic body measuring 1.9 x 1.4 cm, with prominence of the pancreatic duct distally, up to 0.6 cm. This lesion was present on remote prior examination dated 05/04/2009, and has slightly increased in size over a long period of time. This is consistent with a small IPMN and benign given indolent behavior over greater than 10 years. 5. Background of very fine centrilobular pulmonary nodules, most concentrated in the lung apices, most commonly seen in smoking-related respiratory bronchiolitis. 6. Coronary artery disease.   Aortic Atherosclerosis (ICD10-I70.0).    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 Of note, a variant of uncertain significance was detected in the ATM (p.A869G), BLM (c.800-3T>G), and SDHA (p.V632F) genes. Report date is 04/27/2021.  The CancerNext-Expanded gene panel offered by [facility] and includes sequencing, rearrangement, and RNA analysis for the following 77 genes: AIP, ALK, APC, ATM, AXIN2, BAP1, BARD1, BLM, BMPR1A, BRCA1, BRCA2, BRIP1, CDC73, CDH1, CDK4, CDKN1B, CDKN2A, CHEK2, CTNNA1, DICER1, FANCC, FH, FLCN, GALNT12, KIF1B, LZTR1, MAX, MEN1, MET, MLH1, MSH2, MSH3, MSH6, MUTYH, NBN, NF1, NF2, NTHL1, PALB2, PHOX2B, PMS2, POT1, PRKAR1A, PTCH1, PTEN, RAD51C, RAD51D, RB1, RECQL, RET, SDHA, SDHAF2, SDHB, SDHC, SDHD, SMAD4, SMARCA4, SMARCB1, SMARCE1, STK11, SUFU, TMEM127, TP53, TSC1, TSC2, VHL and XRCC2 (sequencing and deletion/duplication); EGFR, EGLN1, HOXB13, KIT, MITF, PDGFRA, POLD1, and POLE (sequencing only); EPCAM and GREM1 (deletion/duplication only).    05/11/2021 - 09/28/2021 Chemotherapy   Patient is on Treatment Plan : COLORECTAL Pembrolizumab (200) q21d     11/24/2021 -  Chemotherapy   Patient is on Treatment Plan : COLORECTAL FOLFOX q14d x 4 months        INTERVAL HISTORY:  Aaron Moss is here for a follow up of duodenal cancer   He was last seen by me on 01/26/2022 He presents to the clinic accompanied by his daughter. Pt reports he is doing well.Pt denies pain. Pt states that he had some diarrhea but back to normal.     All other systems were reviewed with the patient and are negative.  MEDICAL HISTORY:  Past Medical History:  Diagnosis Date   Anemia 10/31/2021   Dx IDA   Carotid stenosis, left    Colon cancer ([facility]) 2000   Coronary artery disease    per Dr. Jacalyn Lefevre note from 04/13/21   Diverticulosis of colon    Duodenal cancer ([facility]) 01/2021   GERD (gastroesophageal reflux disease)    Glaucoma    History of chemotherapy 2000   colon cancer   History of radiation therapy 08/06/13- 10/03/13   prostate 7800 cGy 40 sessions, seminal vesicles 5600 cGy 40 sessions   Hyperlipidemia    Hypertension    Left knee DJD    Prostate cancer ([facility]) 05/23/2013   gleason 3+4=7, volume 11.5 ml   Stroke ([facility]) 04/18/2021   TIA (transient ischemic attack)     SURGICAL HISTORY: Past Surgical History:  Procedure Laterality Date   CHOLECYSTECTOMY N/A 10/19/2021   Procedure: CHOLECYSTECTOMY;  Surgeon: [name], MD;  Location: [facility];  Service: General;  Laterality: N/A;   COLON SURGERY  2000   colon cancer   COLONOSCOPY     ENDARTERECTOMY Left 04/25/2021   Procedure: LEFT ENDARTERECTOMY CAROTID;  Surgeon: Marty Heck, MD;  Location: [facility];  Service: Vascular;  Laterality: Left;   EUS     pancreatic cyst   KNEE ARTHROSCOPY  2007   LT- [place] N/A 10/19/2021   Procedure: LAPAROSCOPY DIAGNOSTIC;  Surgeon: [name], MD;  Location: [facility];  Service: General;  Laterality: N/A;  GENERAL AND TAP BLOCK   PATCH ANGIOPLASTY Left 04/25/2021   Procedure: PATCH ANGIOPLASTY XENOSURE 1CM X 6CM;  Surgeon: Marty Heck, MD;  Location: [facility];  Service: Vascular;  Laterality: Left;   PORTACATH PLACEMENT Left 11/17/2021   Procedure: PORT PLACEMENT;   Surgeon: [name], MD;  Location: [facility];  Service: General;  Laterality: Left;   PROSTATE BIOPSY  05/23/13   gleason 7, volume 11.5 ml   WHIPPLE PROCEDURE N/A 10/19/2021   Procedure: ATTEMPTED WHIPPLE PROCEDURE  WITH GASTRODUODENAL BYPASS;  Surgeon: [name], MD;  Location: [facility];  Service: General;  Laterality: N/A;  GENERAL AND TAP BLOCK    I have reviewed the social history and family history with the patient and they are unchanged from previous note.  ALLERGIES:  is allergic to lyrica [pregabalin].  MEDICATIONS:  Current Outpatient Medications  Medication Sig Dispense Refill   amLODipine (NORVASC) 10 MG tablet Take 1 tablet by mouth once daily 90 tablet 3   aspirin EC 81 MG EC tablet Take 1 tablet (81 mg total) by mouth daily. Swallow whole. 30 tablet 11   atorvastatin (LIPITOR) 40 MG tablet Take 1 tablet by mouth once daily 90 tablet 2   benazepril (LOTENSIN) 40 MG tablet Take 1 tablet by mouth once daily 90 tablet 3   carvedilol (COREG) 12.5 MG tablet Take 12.5 mg by mouth 2 (two) times daily.     iron polysaccharides (FERREX 150) 150 MG capsule Take 1 capsule by mouth twice daily 180 capsule 1   KLOR-CON M20 20 MEQ tablet Take 20 mEq by mouth 3 (three) times daily.     lidocaine-prilocaine (EMLA) cream Apply to affected area once 30 g 3   pantoprazole (PROTONIX) 40 MG tablet Take 1 tablet by mouth once daily 90 tablet 0   potassium chloride 20 MEQ TBCR Take 20 mEq by mouth 3 (three) times daily. 90 tablet 0   prochlorperazine (COMPAZINE) 10 MG tablet Take 1 tablet (10 mg total) by mouth every 6 (six) hours as needed for nausea or vomiting (Use for nausea and / or vomiting unresolved with ondansetron (Zofran).). (Patient not taking: Reported on 11/16/2021) 30 tablet 0   traMADol (ULTRAM) 50 MG tablet TAKE 1 TABLET BY MOUTH EVERY 6 HOURS AS NEEDED FOR PAIN 120 tablet 1   Travoprost, BAK Free, (TRAVATAN) 0.004 % SOLN ophthalmic solution Place 1 drop into both eyes at bedtime.      VITAMIN D PO Take 2 tablets by mouth daily. Gummies     No current facility-administered medications for this visit.    PHYSICAL EXAMINATION: ECOG PERFORMANCE STATUS: 1 - Symptomatic but completely ambulatory  Vitals:   02/16/22 1127  BP: (!) 153/64  Pulse: 72  Resp: 18  Temp: 98.4 F (36.9 C)  SpO2: 100%   Wt Readings from Last 3 Encounters:  02/16/22 204 lb 9.6 oz (92.8 kg)  01/31/22 197 lb (89.4 kg)  01/26/22 198 lb 14.4 oz (90.2 kg)     GENERAL:alert, no distress and comfortable SKIN: skin color normal, no rashes or significant lesions EYES: normal, Conjunctiva are pink and non-injected, sclera clear  NEURO: alert & oriented x 3 with  fluent speech  LABORATORY DATA:  I have reviewed the data as listed    Latest Ref Rng & Units 02/16/2022   10:54 AM 01/26/2022   10:11 AM 01/12/2022   10:37 AM  CBC  WBC 4.0 - 10.5 K/uL 5.4  7.6  3.0   Hemoglobin 13.0 - 17.0 g/dL 11.9  11.6  10.6   Hematocrit 39.0 - 52.0 % 37.6  36.9  34.0   Platelets 150 - 400 K/uL 199  140  130         Latest Ref Rng & Units 02/16/2022   10:54 AM 01/26/2022   10:11 AM 01/12/2022   10:37 AM  CMP  Glucose 70 - 99 mg/dL 95  110  125   BUN 8 - 23 mg/dL _0 Creatinine 0.61 - 1.24 mg/dL 0.88  1.02  0.97   Sodium 135 - 145 mmol/L 142  144  143   Potassium 3.5 - 5.1 mmol/L 4.1  3.8  3.6   Chloride 98 - 111 mmol/L 109  112  113   CO2 22 - 32 mmol/L _1 Calcium 8.9 - 10.3 mg/dL 9.0  8.4  8.3   Total Protein 6.5 - 8.1 g/dL 6.0  5.7  5.4   Total Bilirubin 0.3 - 1.2 mg/dL 0.7  0.8  0.9   Alkaline Phos 38 - 126 U/L 103  112  93   AST 15 - 41 U/L 46  23  19   ALT 0 - 44 U/L 73  25  22       RADIOGRAPHIC STUDIES: I have personally reviewed the radiological images as listed and agreed with the findings in the report. No results found.    No orders of the defined types were placed in this encounter.  All questions were answered. The patient knows to call the clinic with any  problems, questions or concerns. No barriers to learning was detected. The total time spent in the appointment was 30 minutes.     [name], MD 02/16/2022   I, Audry Riles, CMA, am acting as scribe for [name], MD.   I have reviewed the above documentation for accuracy and completeness, and I agree with the above.

## 2022-02-16 ENCOUNTER — Other Ambulatory Visit: Payer: Self-pay

## 2022-02-16 ENCOUNTER — Inpatient Hospital Stay: Payer: Medicare Other

## 2022-02-16 ENCOUNTER — Inpatient Hospital Stay (HOSPITAL_BASED_OUTPATIENT_CLINIC_OR_DEPARTMENT_OTHER): Payer: Medicare Other | Admitting: Hematology

## 2022-02-16 ENCOUNTER — Encounter: Payer: Self-pay | Admitting: Hematology

## 2022-02-16 VITALS — BP 153/64 | HR 72 | Temp 98.4°F | Resp 18 | Ht 66.0 in | Wt 204.6 lb

## 2022-02-16 DIAGNOSIS — Z95828 Presence of other vascular implants and grafts: Secondary | ICD-10-CM

## 2022-02-16 DIAGNOSIS — C170 Malignant neoplasm of duodenum: Secondary | ICD-10-CM

## 2022-02-16 DIAGNOSIS — Z5111 Encounter for antineoplastic chemotherapy: Secondary | ICD-10-CM | POA: Diagnosis not present

## 2022-02-16 LAB — CMP (CANCER CENTER ONLY)
ALT: 73 U/L — ABNORMAL HIGH (ref 0–44)
AST: 46 U/L — ABNORMAL HIGH (ref 15–41)
Albumin: 3.3 g/dL — ABNORMAL LOW (ref 3.5–5.0)
Alkaline Phosphatase: 103 U/L (ref 38–126)
Anion gap: 4 — ABNORMAL LOW (ref 5–15)
BUN: 17 mg/dL (ref 8–23)
CO2: 29 mmol/L (ref 22–32)
Calcium: 9 mg/dL (ref 8.9–10.3)
Chloride: 109 mmol/L (ref 98–111)
Creatinine: 0.88 mg/dL (ref 0.61–1.24)
GFR, Estimated: >60 mL/min (ref >60)
Glucose, Bld: 95 mg/dL (ref 70–99)
Potassium: 4.1 mmol/L (ref 3.5–5.1)
Sodium: 142 mmol/L (ref 135–145)
Total Bilirubin: 0.7 mg/dL (ref 0.3–1.2)
Total Protein: 6 g/dL — ABNORMAL LOW (ref 6.5–8.1)

## 2022-02-16 LAB — CBC WITH DIFFERENTIAL (CANCER CENTER ONLY)
Abs Immature Granulocytes: 0.02 10*3/uL (ref 0.00–0.07)
Basophils Absolute: 0 10*3/uL (ref 0.0–0.1)
Basophils Relative: 1 %
Eosinophils Absolute: 0.3 10*3/uL (ref 0.0–0.5)
Eosinophils Relative: 6 %
HCT: 37.6 % — ABNORMAL LOW (ref 39.0–52.0)
Hemoglobin: 11.9 g/dL — ABNORMAL LOW (ref 13.0–17.0)
Immature Granulocytes: 0 %
Lymphocytes Relative: 26 %
Lymphs Abs: 1.4 10*3/uL (ref 0.7–4.0)
MCH: 25.5 pg — ABNORMAL LOW (ref 26.0–34.0)
MCHC: 31.6 g/dL (ref 30.0–36.0)
MCV: 80.7 fL (ref 80.0–100.0)
Monocytes Absolute: 0.9 10*3/uL (ref 0.1–1.0)
Monocytes Relative: 17 %
Neutro Abs: 2.7 10*3/uL (ref 1.7–7.7)
Neutrophils Relative %: 50 %
Platelet Count: 199 10*3/uL (ref 150–400)
RBC: 4.66 MIL/uL (ref 4.22–5.81)
RDW: 19.4 % — ABNORMAL HIGH (ref 11.5–15.5)
WBC Count: 5.4 10*3/uL (ref 4.0–10.5)
nRBC: 0 % (ref 0.0–0.2)

## 2022-02-16 LAB — CEA (IN HOUSE-CHCC): CEA (CHCC-In House): 8.14 ng/mL — ABNORMAL HIGH (ref 0.00–5.00)

## 2022-02-16 MED ORDER — PALONOSETRON HCL INJECTION 0.25 MG/5ML
0.2500 mg | Freq: Once | INTRAVENOUS | Status: AC
  Administered 2022-02-16: 0.25 mg via INTRAVENOUS
  Filled 2022-02-16: qty 5

## 2022-02-16 MED ORDER — LEUCOVORIN CALCIUM INJECTION 350 MG
400.0000 mg/m2 | Freq: Once | INTRAVENOUS | Status: AC
  Administered 2022-02-16: 844 mg via INTRAVENOUS
  Filled 2022-02-16: qty 17.5

## 2022-02-16 MED ORDER — DEXTROSE 5 % IV SOLN: Freq: Once | INTRAVENOUS | Status: AC

## 2022-02-16 MED ORDER — SODIUM CHLORIDE 0.9% FLUSH
10.0000 mL | INTRAVENOUS | Status: AC | PRN
  Administered 2022-02-16: 10 mL

## 2022-02-16 MED ORDER — SODIUM CHLORIDE 0.9 % IV SOLN
2380.0000 mg/m2 | INTRAVENOUS | Status: DC
  Administered 2022-02-16: 5000 mg via INTRAVENOUS
  Filled 2022-02-16: qty 100

## 2022-02-16 MED ORDER — HEPARIN SOD (PORK) LOCK FLUSH 100 UNIT/ML IV SOLN: 500.0000 [IU] | Freq: Once | INTRAVENOUS | Status: DC | PRN

## 2022-02-16 MED ORDER — OXALIPLATIN CHEMO INJECTION 100 MG/20ML
70.0000 mg/m2 | Freq: Once | INTRAVENOUS | Status: AC
  Administered 2022-02-16: 150 mg via INTRAVENOUS
  Filled 2022-02-16: qty 20

## 2022-02-16 MED ORDER — SODIUM CHLORIDE 0.9 % IV SOLN
10.0000 mg | Freq: Once | INTRAVENOUS | Status: AC
  Administered 2022-02-16: 10 mg via INTRAVENOUS
  Filled 2022-02-16: qty 10

## 2022-02-16 MED ORDER — SODIUM CHLORIDE 0.9% FLUSH: 10.0000 mL | INTRAVENOUS | Status: DC | PRN

## 2022-02-16 NOTE — Patient Instructions (Signed)
[facility] ONCOLOGY   Discharge Instructions: Thank you for choosing [facility] to provide your oncology and hematology care.   If you have a lab appointment with the [facility], please go directly to the [facility] and check in at the registration area.   Wear comfortable clothing and clothing appropriate for easy access to any Portacath or PICC line.   We strive to give you quality time with your provider. You may need to reschedule your appointment if you arrive late (15 or more minutes).  Arriving late affects you and other patients whose appointments are after yours.  Also, if you miss three or more appointments without notifying the office, you may be dismissed from the clinic at the provider's discretion.      For prescription refill requests, have your pharmacy contact our office and allow 72 hours for refills to be completed.    Today you received the following chemotherapy and/or immunotherapy agents: Oxaliplatin, Leucovorin, and Fluorouracil (Adrucil)      To help prevent nausea and vomiting after your treatment, we encourage you to take your nausea medication as directed.  BELOW ARE SYMPTOMS THAT SHOULD BE REPORTED IMMEDIATELY: *FEVER GREATER THAN 100.4 F (38 C) OR HIGHER *CHILLS OR SWEATING *NAUSEA AND VOMITING THAT IS NOT CONTROLLED WITH YOUR NAUSEA MEDICATION *UNUSUAL SHORTNESS OF BREATH *UNUSUAL BRUISING OR BLEEDING *URINARY PROBLEMS (pain or burning when urinating, or frequent urination) *BOWEL PROBLEMS (unusual diarrhea, constipation, pain near the anus) TENDERNESS IN MOUTH AND THROAT WITH OR WITHOUT PRESENCE OF ULCERS (sore throat, sores in mouth, or a toothache) UNUSUAL RASH, SWELLING OR PAIN  UNUSUAL VAGINAL DISCHARGE OR ITCHING   Items with * indicate a potential emergency and should be followed up as soon as possible or go to the Emergency Department if any problems should occur.  Please show the CHEMOTHERAPY ALERT CARD  or IMMUNOTHERAPY ALERT CARD at check-in to the Emergency Department and triage nurse.  Should you have questions after your visit or need to cancel or reschedule your appointment, please contact [facility]  Dept: [phone]  and follow the prompts.  Office hours are 8:00 a.m. to 4:30 p.m. Monday - Friday. Please note that voicemails left after 4:00 p.m. may not be returned until the following business day.  We are closed weekends and major holidays. You have access to a nurse at all times for urgent questions. Please call the main number to the clinic Dept: [phone] and follow the prompts.   For any non-urgent questions, you may also contact your provider using MyChart. We now offer e-Visits for anyone 34 and older to request care online for non-urgent symptoms. For details visit [email or web address].   Also download the MyChart app! Go to the app store, search "MyChart", open the app, select [facility], and log in with your MyChart username and password.  Masks are optional in the cancer centers. If you would like for your care team to wear a mask while they are taking care of you, please let them know. You may have one support person who is at least 78 years old accompany you for your appointments.  The chemotherapy medication bag should finish at 46 hours, 96 hours, or 7 days. For example, if your pump is scheduled for 46 hours and it was put on at 4:00 p.m., it should finish at 2:00 p.m. the day it is scheduled to come off regardless of your appointment time.     Estimated  time to finish at 11/11 @ 12:30 PM .   If the display on your pump reads "Low Volume" and it is beeping, take the batteries out of the pump and come to the cancer center for it to be taken off.   If the pump alarms go off prior to the pump reading "Low Volume" then call [phone] and someone can assist you.  If the plunger comes out and the chemotherapy medication is leaking out,  please use your home chemo spill kit to clean up the spill. Do NOT use paper towels or other household products.  If you have problems or questions regarding your pump, please call either [phone] (24 hours a day) or the cancer center Monday-Friday 8:00 a.m.- 4:30 p.m. at the clinic number and we will assist you. If you are unable to get assistance, then go to the nearest Emergency Department and ask the staff to contact the IV team for assistance.

## 2022-02-18 ENCOUNTER — Inpatient Hospital Stay: Payer: Medicare Other | Attending: Nurse Practitioner

## 2022-02-18 DIAGNOSIS — Z7982 Long term (current) use of aspirin: Secondary | ICD-10-CM | POA: Insufficient documentation

## 2022-02-18 DIAGNOSIS — Z923 Personal history of irradiation: Secondary | ICD-10-CM | POA: Diagnosis not present

## 2022-02-18 DIAGNOSIS — C170 Malignant neoplasm of duodenum: Secondary | ICD-10-CM | POA: Insufficient documentation

## 2022-02-18 DIAGNOSIS — Z9884 Bariatric surgery status: Secondary | ICD-10-CM | POA: Diagnosis not present

## 2022-02-18 DIAGNOSIS — Z79899 Other long term (current) drug therapy: Secondary | ICD-10-CM | POA: Diagnosis not present

## 2022-02-18 DIAGNOSIS — Z5111 Encounter for antineoplastic chemotherapy: Secondary | ICD-10-CM | POA: Insufficient documentation

## 2022-02-18 DIAGNOSIS — Z8546 Personal history of malignant neoplasm of prostate: Secondary | ICD-10-CM | POA: Insufficient documentation

## 2022-02-18 DIAGNOSIS — I10 Essential (primary) hypertension: Secondary | ICD-10-CM | POA: Diagnosis not present

## 2022-02-18 DIAGNOSIS — D508 Other iron deficiency anemias: Secondary | ICD-10-CM | POA: Diagnosis present

## 2022-02-18 DIAGNOSIS — Z1509 Genetic susceptibility to other malignant neoplasm: Secondary | ICD-10-CM | POA: Insufficient documentation

## 2022-02-18 MED ORDER — HEPARIN SOD (PORK) LOCK FLUSH 100 UNIT/ML IV SOLN
500.0000 [IU] | Freq: Once | INTRAVENOUS | Status: AC | PRN
  Administered 2022-02-18: 500 [IU]

## 2022-02-18 MED ORDER — SODIUM CHLORIDE 0.9% FLUSH
10.0000 mL | INTRAVENOUS | Status: DC | PRN
  Administered 2022-02-18: 10 mL

## 2022-02-20 ENCOUNTER — Ambulatory Visit (HOSPITAL_COMMUNITY)
Admission: RE | Admit: 2022-02-20 | Discharge: 2022-02-20 | Disposition: A | Discharge status: Home / Self Care | Payer: Medicare Other | Source: Ambulatory Visit | Attending: Hematology | Admitting: Hematology

## 2022-02-20 DIAGNOSIS — C170 Malignant neoplasm of duodenum: Secondary | ICD-10-CM | POA: Insufficient documentation

## 2022-02-20 MED ORDER — HEPARIN SOD (PORK) LOCK FLUSH 100 UNIT/ML IV SOLN
INTRAVENOUS | Status: AC
  Filled 2022-02-20: qty 5

## 2022-02-20 MED ORDER — IOHEXOL 300 MG/ML  SOLN
100.0000 mL | Freq: Once | INTRAMUSCULAR | Status: AC | PRN
  Administered 2022-02-20: 100 mL via INTRAVENOUS

## 2022-02-20 MED ORDER — IOHEXOL 9 MG/ML PO SOLN
ORAL | Status: AC
  Filled 2022-02-20: qty 1000

## 2022-02-20 MED ORDER — HEPARIN SOD (PORK) LOCK FLUSH 100 UNIT/ML IV SOLN
500.0000 [IU] | Freq: Once | INTRAVENOUS | Status: AC
  Administered 2022-02-20: 500 [IU] via INTRAVENOUS

## 2022-02-20 MED ORDER — IOHEXOL 9 MG/ML PO SOLN: 1000.0000 mL | ORAL | Status: AC

## 2022-02-22 NOTE — Assessment & Plan Note (Signed)
-  diagnosed 02/2021 by endo-/colonoscopy for weight loss and anemia. Treatment delayed due to his stroke on 04/18/21. S/p 8 cycles neoadjuvant Keytruda 05/11/21 - 09/28/21. He tolerated well with mild fatigue.  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 -He began FOLFOX on 11/24/21. He has tolerated well overall with some taste changes and mild fatigue -restaging CT scan showed PR

## 2022-02-22 NOTE — Assessment & Plan Note (Signed)
-  he has a personal history of stage pT3N0 colon cancer in 2000 and prostate cancer dx in 2015, s/p 8 weeks of IMRT with Dr. Valere Dross. -genetic testing and counseling on 04/07/21 confirmed a PMS2 mutation, as well as VUS in ATM, BLM, and SDHA.

## 2022-02-23 ENCOUNTER — Inpatient Hospital Stay (HOSPITAL_BASED_OUTPATIENT_CLINIC_OR_DEPARTMENT_OTHER): Payer: Medicare Other | Admitting: Hematology

## 2022-02-23 ENCOUNTER — Encounter: Payer: Self-pay | Admitting: Hematology

## 2022-02-23 DIAGNOSIS — C170 Malignant neoplasm of duodenum: Secondary | ICD-10-CM | POA: Diagnosis not present

## 2022-02-23 DIAGNOSIS — Z1509 Genetic susceptibility to other malignant neoplasm: Secondary | ICD-10-CM

## 2022-02-23 NOTE — Progress Notes (Signed)
Aaron Moss   [phone]) [phone] Fax:([phone]) [phone]   Clinic Follow up Note   Patient Care Team: Biagio Borg, MD as PCP - General Stanford Breed, Denice Bors, MD as PCP - Cardiology (Cardiology) [name], MD as Consulting Physician (Gastroenterology) [name], MD as Consulting Physician (Orthopedic Surgery) [name], MD as Consulting Physician (Ophthalmology) [name], MD as Consulting Physician (Oncology)  Date of Service:  02/23/2022  I connected with Aaron Moss on 02/23/2022 at  8:40 AM EST by telephone visit and verified that I am speaking with the correct person using two identifiers.  I discussed the limitations, risks, security and privacy concerns of performing an evaluation and management service by telephone and the availability of in person appointments. I also discussed with the patient that there may be a patient responsible charge related to this service. The patient expressed understanding and agreed to proceed.   Other persons participating in the visit and their role in the encounter:   Patient's location:  Home Provider's location:  Office  CHIEF COMPLAINT: f/u of duodenal cancer    CURRENT THERAPY:  FOLFOX, q14d, starting 11/24/21    ASSESSMENT & PLAN:  Aaron Moss is a 76 y.o. 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 Treatment delayed due to his stroke on 04/18/21. S/p 8 cycles neoadjuvant Keytruda 05/11/21 - 09/28/21. He tolerated well with mild fatigue.  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 -He began FOLFOX on 11/24/21. 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  Valere Dross. -genetic testing and counseling on 04/07/21 confirmed a PMS2 mutation, as well as VUS in ATM, BLM, and SDHA.    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 Simple appearing right renal cyst. 2. Otherwise negative abdominal ultrasound   03/18/2021 Procedure   Upper Endoscopy/Colonoscopy, Dr. Loletha Carrow  Upper Endoscopy Impression: - Normal esophagus. - A single submucosal papule (nodule) found in the stomach. - Duodenal mass. Biopsied. Concerning for malignancy.  Colonoscopy Impression: - Diverticulosis in the entire examined colon. - The examined portion of the ileum was normal. - Patent end-to-end colo-colonic anastomosis, characterized by healthy appearing mucosa. - Internal hemorrhoids. - No specimens collected.   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 Circumferential wall thickening of the first portion of the duodenum, in keeping with known primary malignancy. 2. Enlarged, hypodense lymph nodes anterior to the pancreatic head and in the portacaval station, concerning for nodal metastatic disease. 3. No  other evidence of metastatic disease in the chest, abdomen, or pelvis. 4. Incidental note of a fluid attenuation lesion within the  proximal pancreatic body measuring 1.9 x 1.4 cm, with prominence of the pancreatic duct distally, up to 0.6 cm. This lesion was present on remote prior examination dated 05/04/2009, and has slightly increased in size over a long period of time. This is consistent with a small IPMN and benign given indolent behavior over greater than 10 years. 5. Background of very fine centrilobular pulmonary nodules, most concentrated in the lung apices, most commonly seen in smoking-related respiratory bronchiolitis. 6. Coronary artery disease.   Aortic Atherosclerosis (ICD10-I70.0).    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 Of note, a variant of uncertain significance was detected in the ATM (p.A869G), BLM (c.800-3T>G), and SDHA (p.V632F) genes. Report date is 04/27/2021.  The CancerNext-Expanded gene panel offered by [facility] and includes sequencing, rearrangement, and RNA analysis for the following 77 genes: AIP, ALK, APC, ATM, AXIN2, BAP1, BARD1, BLM, BMPR1A, BRCA1, BRCA2, BRIP1, CDC73, CDH1, CDK4, CDKN1B, CDKN2A, CHEK2, CTNNA1, DICER1, FANCC, FH, FLCN, GALNT12, KIF1B, LZTR1, MAX, MEN1, MET, MLH1, MSH2, MSH3, MSH6, MUTYH, NBN, NF1, NF2, NTHL1, PALB2, PHOX2B, PMS2, POT1, PRKAR1A, PTCH1, PTEN, RAD51C, RAD51D, RB1, RECQL, RET, SDHA, SDHAF2, SDHB, SDHC, SDHD, SMAD4, SMARCA4, SMARCB1, SMARCE1, STK11, SUFU, TMEM127, TP53, TSC1, TSC2, VHL and XRCC2 (sequencing and deletion/duplication); EGFR, EGLN1, HOXB13, KIT, MITF, PDGFRA, POLD1, and POLE (sequencing only); EPCAM and GREM1 (deletion/duplication only).    05/11/2021 - 09/28/2021 Chemotherapy   Patient is on Treatment Plan : COLORECTAL Pembrolizumab (200) q21d     11/24/2021 -  Chemotherapy   Patient is on Treatment Plan : COLORECTAL FOLFOX q14d x 4 months      Imaging     02/20/2022 Imaging    IMPRESSION: 1. Distal stomach/duodenal mass is  decreased in size when compared with prior exam. 2. Stable upper abdominal adenopathy. 3. 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 4. Aortic Atherosclerosis (ICD10-I70.0).      INTERVAL HISTORY:  Aaron Moss was contacted for a follow up of duodenal cancer  . He was last seen by me on 02/16/2022. Pt reports no concern after last treatment.   All other systems were reviewed with the patient and are negative.  MEDICAL HISTORY:  Past Medical History:  Diagnosis Date   Anemia 10/31/2021   Dx IDA   Carotid stenosis, left    Colon cancer ([place]) 2000   Coronary artery disease    per Dr. Jacalyn Lefevre note from 04/13/21   Diverticulosis of colon    Duodenal cancer ([place]) 01/2021   GERD (gastroesophageal reflux disease)    Glaucoma    History of chemotherapy 2000   colon cancer   History of radiation therapy 08/06/13- 10/03/13   prostate 7800 cGy 40 sessions, seminal vesicles 5600 cGy 40 sessions   Hyperlipidemia    Hypertension    Left knee DJD    Prostate cancer ([place]) 05/23/2013   gleason 3+4=7, volume 11.5 ml   Stroke ([place]) 04/18/2021   TIA (transient ischemic attack)     SURGICAL HISTORY: Past Surgical History:  Procedure Laterality Date   CHOLECYSTECTOMY N/A 10/19/2021   Procedure: CHOLECYSTECTOMY;  Surgeon: [name], MD;  Location: [place];  Service: General;  Laterality: N/A;   COLON SURGERY  2000   colon cancer   COLONOSCOPY     ENDARTERECTOMY Left 04/25/2021   Procedure: LEFT ENDARTERECTOMY CAROTID;  Surgeon: Marty Heck, MD;  Location: [place];  Service: Vascular;  Laterality: Left;   EUS     pancreatic cyst   KNEE ARTHROSCOPY  2007   LT- [facility]   LAPAROSCOPY N/A 10/19/2021   Procedure: LAPAROSCOPY DIAGNOSTIC;  Surgeon: [name], MD;  Location: [place];  Service: General;  Laterality: N/A;  GENERAL AND TAP BLOCK   PATCH ANGIOPLASTY  Left 04/25/2021   Procedure: PATCH ANGIOPLASTY XENOSURE 1CM X 6CM;  Surgeon: Marty Heck, MD;  Location: [place];  Service: Vascular;  Laterality: Left;   PORTACATH PLACEMENT Left 11/17/2021   Procedure: PORT PLACEMENT;  Surgeon: [name], MD;  Location: [place];  Service: General;  Laterality: Left;   PROSTATE BIOPSY  05/23/13   gleason 7, volume 11.5 ml   WHIPPLE PROCEDURE N/A 10/19/2021   Procedure: ATTEMPTED WHIPPLE PROCEDURE  WITH GASTRODUODENAL BYPASS;  Surgeon: [name], MD;  Location: [place];  Service: General;  Laterality: N/A;  GENERAL AND TAP BLOCK    I have reviewed the social history and family history with the patient and they are unchanged from previous note.  ALLERGIES:  is allergic to lyrica [pregabalin].  MEDICATIONS:  Current Outpatient Medications  Medication Sig Dispense Refill   amLODipine (NORVASC) 10 MG tablet Take 1 tablet by mouth once daily 90 tablet 3   aspirin EC 81 MG EC tablet Take 1 tablet (81 mg total) by mouth daily. Swallow whole. 30 tablet 11   atorvastatin (LIPITOR) 40 MG tablet Take 1 tablet by mouth once daily 90 tablet 2   benazepril (LOTENSIN) 40 MG tablet Take 1 tablet by mouth once daily 90 tablet 3   carvedilol (COREG) 12.5 MG tablet Take 12.5 mg by mouth 2 (two) times daily.     iron polysaccharides (FERREX 150) 150 MG capsule Take 1 capsule by mouth twice daily 180 capsule 1   KLOR-CON M20 20 MEQ tablet Take 20 mEq by mouth 3 (three) times daily.     lidocaine-prilocaine (EMLA) cream Apply to affected area once 30 g 3   pantoprazole (PROTONIX) 40 MG tablet Take 1 tablet by mouth once daily 90 tablet 0   potassium chloride 20 MEQ TBCR Take 20 mEq by mouth 3 (three) times daily. 90 tablet 0   prochlorperazine (COMPAZINE) 10 MG tablet Take 1 tablet (10 mg total) by mouth every 6 (six) hours as needed for nausea or vomiting (Use for nausea and / or vomiting unresolved with ondansetron (Zofran).). (Patient not taking: Reported on 11/16/2021)  30 tablet 0   traMADol (ULTRAM) 50 MG tablet TAKE 1 TABLET BY MOUTH EVERY 6 HOURS AS NEEDED FOR PAIN 120 tablet 1   Travoprost, BAK Free, (TRAVATAN) 0.004 % SOLN ophthalmic solution Place 1 drop into both eyes at bedtime.     VITAMIN D PO Take 2 tablets by mouth daily. Gummies     No current facility-administered medications for this visit.    PHYSICAL EXAMINATION: ECOG PERFORMANCE STATUS: 1 - Symptomatic but completely ambulatory  There were no vitals filed for this visit. Wt Readings from Last 3 Encounters:  02/16/22 204 lb 9.6 oz (92.8 kg)  01/31/22 197 lb (89.4 kg)  01/26/22 198 lb 14.4 oz (90.2 kg)     No vitals taken today, Exam not performed today  LABORATORY DATA:  I have reviewed the data as listed  Latest Ref Rng & Units 02/16/2022   10:54 AM 01/26/2022   10:11 AM 01/12/2022   10:37 AM  CBC  WBC 4.0 - 10.5 K/uL 5.4  7.6  3.0   Hemoglobin 13.0 - 17.0 g/dL 11.9  11.6  10.6   Hematocrit 39.0 - 52.0 % 37.6  36.9  34.0   Platelets 150 - 400 K/uL 199  140  130         Latest Ref Rng & Units 02/16/2022   10:54 AM 01/26/2022   10:11 AM 01/12/2022   10:37 AM  CMP  Glucose 70 - 99 mg/dL 95  110  125   BUN 8 - 23 mg/dL _0 Creatinine 0.61 - 1.24 mg/dL 0.88  1.02  0.97   Sodium 135 - 145 mmol/L 142  144  143   Potassium 3.5 - 5.1 mmol/L 4.1  3.8  3.6   Chloride 98 - 111 mmol/L 109  112  113   CO2 22 - 32 mmol/L _1 Calcium 8.9 - 10.3 mg/dL 9.0  8.4  8.3   Total Protein 6.5 - 8.1 g/dL 6.0  5.7  5.4   Total Bilirubin 0.3 - 1.2 mg/dL 0.7  0.8  0.9   Alkaline Phos 38 - 126 U/L 103  112  93   AST 15 - 41 U/L 46  23  19   ALT 0 - 44 U/L 73  25  22       RADIOGRAPHIC STUDIES: I have personally reviewed the radiological images as listed and agreed with the findings in the report. No results found.    No orders of the defined types were placed in this encounter.  All questions were answered. The patient knows to call the clinic with any  problems, questions or concerns. No barriers to learning was detected. The total time spent in the appointment was 22 minutes.     [name], MD 02/23/2022   Felicity Coyer am acting as scribe for [name], MD.   I have reviewed the above documentation for accuracy and completeness, and I agree with the above.

## 2022-02-24 ENCOUNTER — Other Ambulatory Visit: Payer: Self-pay

## 2022-02-24 ENCOUNTER — Telehealth: Payer: Self-pay | Admitting: Hematology

## 2022-02-24 NOTE — Telephone Encounter (Signed)
Called patient to notified of updated appointment times. Patient notified.

## 2022-03-01 MED FILL — Dexamethasone Sodium Phosphate Inj 100 MG/10ML: INTRAMUSCULAR | Qty: 1 | Status: AC

## 2022-03-02 ENCOUNTER — Inpatient Hospital Stay: Payer: Medicare Other

## 2022-03-02 ENCOUNTER — Inpatient Hospital Stay: Payer: Medicare Other | Admitting: Hematology

## 2022-03-02 VITALS — BP 153/62 | HR 64 | Temp 98.0°F | Resp 14 | Wt 201.8 lb

## 2022-03-02 DIAGNOSIS — C170 Malignant neoplasm of duodenum: Secondary | ICD-10-CM

## 2022-03-02 DIAGNOSIS — Z5111 Encounter for antineoplastic chemotherapy: Secondary | ICD-10-CM | POA: Diagnosis not present

## 2022-03-02 DIAGNOSIS — D500 Iron deficiency anemia secondary to blood loss (chronic): Secondary | ICD-10-CM

## 2022-03-02 LAB — CBC WITH DIFFERENTIAL (CANCER CENTER ONLY)
Abs Immature Granulocytes: 0.01 10*3/uL (ref 0.00–0.07)
Basophils Absolute: 0 10*3/uL (ref 0.0–0.1)
Basophils Relative: 0 %
Eosinophils Absolute: 0.3 10*3/uL (ref 0.0–0.5)
Eosinophils Relative: 7 %
HCT: 36 % — ABNORMAL LOW (ref 39.0–52.0)
Hemoglobin: 11.4 g/dL — ABNORMAL LOW (ref 13.0–17.0)
Immature Granulocytes: 0 %
Lymphocytes Relative: 28 %
Lymphs Abs: 1.3 10*3/uL (ref 0.7–4.0)
MCH: 25.3 pg — ABNORMAL LOW (ref 26.0–34.0)
MCHC: 31.7 g/dL (ref 30.0–36.0)
MCV: 80 fL (ref 80.0–100.0)
Monocytes Absolute: 0.6 10*3/uL (ref 0.1–1.0)
Monocytes Relative: 13 %
Neutro Abs: 2.4 10*3/uL (ref 1.7–7.7)
Neutrophils Relative %: 52 %
Platelet Count: 110 10*3/uL — ABNORMAL LOW (ref 150–400)
RBC: 4.5 MIL/uL (ref 4.22–5.81)
RDW: 18.4 % — ABNORMAL HIGH (ref 11.5–15.5)
WBC Count: 4.6 10*3/uL (ref 4.0–10.5)
nRBC: 0 % (ref 0.0–0.2)

## 2022-03-02 LAB — CMP (CANCER CENTER ONLY)
ALT: 27 U/L (ref 0–44)
AST: 25 U/L (ref 15–41)
Albumin: 3.2 g/dL — ABNORMAL LOW (ref 3.5–5.0)
Alkaline Phosphatase: 99 U/L (ref 38–126)
Anion gap: 3 — ABNORMAL LOW (ref 5–15)
BUN: 16 mg/dL (ref 8–23)
CO2: 29 mmol/L (ref 22–32)
Calcium: 9 mg/dL (ref 8.9–10.3)
Chloride: 111 mmol/L (ref 98–111)
Creatinine: 0.81 mg/dL (ref 0.61–1.24)
GFR, Estimated: >60 mL/min (ref >60)
Glucose, Bld: 94 mg/dL (ref 70–99)
Potassium: 3.8 mmol/L (ref 3.5–5.1)
Sodium: 143 mmol/L (ref 135–145)
Total Bilirubin: 0.8 mg/dL (ref 0.3–1.2)
Total Protein: 5.5 g/dL — ABNORMAL LOW (ref 6.5–8.1)

## 2022-03-02 LAB — IRON AND IRON BINDING CAPACITY (CC-WL,HP ONLY)
Iron: 37 ug/dL — ABNORMAL LOW (ref 45–182)
Saturation Ratios: 13 % — ABNORMAL LOW (ref 17.9–39.5)
TIBC: 283 ug/dL (ref 250–450)
UIBC: 246 ug/dL (ref 117–376)

## 2022-03-02 LAB — FERRITIN: Ferritin: 63 ng/mL (ref 24–336)

## 2022-03-02 MED ORDER — SODIUM CHLORIDE 0.9% FLUSH
10.0000 mL | Freq: Once | INTRAVENOUS | Status: AC | PRN
  Administered 2022-03-02: 10 mL

## 2022-03-02 MED ORDER — LEUCOVORIN CALCIUM INJECTION 350 MG
400.0000 mg/m2 | Freq: Once | INTRAVENOUS | Status: AC
  Administered 2022-03-02: 844 mg via INTRAVENOUS
  Filled 2022-03-02: qty 25

## 2022-03-02 MED ORDER — PALONOSETRON HCL INJECTION 0.25 MG/5ML
0.2500 mg | Freq: Once | INTRAVENOUS | Status: AC
  Administered 2022-03-02: 0.25 mg via INTRAVENOUS
  Filled 2022-03-02: qty 5

## 2022-03-02 MED ORDER — DEXTROSE 5 % IV SOLN: Freq: Once | INTRAVENOUS | Status: AC

## 2022-03-02 MED ORDER — OXALIPLATIN CHEMO INJECTION 100 MG/20ML
70.0000 mg/m2 | Freq: Once | INTRAVENOUS | Status: AC
  Administered 2022-03-02: 150 mg via INTRAVENOUS
  Filled 2022-03-02: qty 10

## 2022-03-02 MED ORDER — SODIUM CHLORIDE 0.9 % IV SOLN
2375.0000 mg/m2 | INTRAVENOUS | Status: DC
  Administered 2022-03-02: 5000 mg via INTRAVENOUS
  Filled 2022-03-02: qty 100

## 2022-03-02 MED ORDER — HEPARIN SOD (PORK) LOCK FLUSH 100 UNIT/ML IV SOLN: 500.0000 [IU] | Freq: Once | INTRAVENOUS | Status: DC | PRN

## 2022-03-02 MED ORDER — SODIUM CHLORIDE 0.9 % IV SOLN
10.0000 mg | Freq: Once | INTRAVENOUS | Status: AC
  Administered 2022-03-02: 10 mg via INTRAVENOUS
  Filled 2022-03-02: qty 10

## 2022-03-02 NOTE — Patient Instructions (Signed)
[facility] MEDICAL ONCOLOGY  Discharge Instructions: Thank you for choosing [facility] to provide your oncology and hematology care.   If you have a lab appointment with the Cancer Center, please go directly to the Cancer Center and check in at the registration area.   Wear comfortable clothing and clothing appropriate for easy access to any Portacath or PICC line.   We strive to give you quality time with your provider. You may need to reschedule your appointment if you arrive late (15 or more minutes).  Arriving late affects you and other patients whose appointments are after yours.  Also, if you miss three or more appointments without notifying the office, you may be dismissed from the clinic at the provider's discretion.      For prescription refill requests, have your pharmacy contact our office and allow 72 hours for refills to be completed.    Today you received the following chemotherapy and/or immunotherapy agents: Oxaliplatin, Leucovorin, Fluorouracil.       To help prevent nausea and vomiting after your treatment, we encourage you to take your nausea medication as directed.  BELOW ARE SYMPTOMS THAT SHOULD BE REPORTED IMMEDIATELY: *FEVER GREATER THAN 100.4 F (38 C) OR HIGHER *CHILLS OR SWEATING *NAUSEA AND VOMITING THAT IS NOT CONTROLLED WITH YOUR NAUSEA MEDICATION *UNUSUAL SHORTNESS OF BREATH *UNUSUAL BRUISING OR BLEEDING *URINARY PROBLEMS (pain or burning when urinating, or frequent urination) *BOWEL PROBLEMS (unusual diarrhea, constipation, pain near the anus) TENDERNESS IN MOUTH AND THROAT WITH OR WITHOUT PRESENCE OF ULCERS (sore throat, sores in mouth, or a toothache) UNUSUAL RASH, SWELLING OR PAIN  UNUSUAL VAGINAL DISCHARGE OR ITCHING   Items with * indicate a potential emergency and should be followed up as soon as possible or go to the Emergency Department if any problems should occur.  Please show the CHEMOTHERAPY ALERT CARD or  IMMUNOTHERAPY ALERT CARD at check-in to the Emergency Department and triage nurse.  Should you have questions after your visit or need to cancel or reschedule your appointment, please contact [facility] MEDICAL ONCOLOGY  Dept: [phone]  and follow the prompts.  Office hours are 8:00 a.m. to 4:30 p.m. Monday - Friday. Please note that voicemails left after 4:00 p.m. may not be returned until the following business day.  We are closed weekends and major holidays. You have access to a nurse at all times for urgent questions. Please call the main number to the clinic Dept: [phone] and follow the prompts.   For any non-urgent questions, you may also contact your provider using MyChart. We now offer e-Visits for anyone 18 and older to request care online for non-urgent symptoms. For details visit [email or web address].   Also download the MyChart app! Go to the app store, search "MyChart", open the app, select [facility], and log in with your MyChart username and password.  Masks are optional in the cancer centers. If you would like for your care team to wear a mask while they are taking care of you, please let them know. You may have one support person who is at least 78 years old accompany you for your appointments. 

## 2022-03-04 ENCOUNTER — Inpatient Hospital Stay: Payer: Medicare Other

## 2022-03-04 VITALS — BP 123/60 | HR 70 | Temp 97.7°F | Resp 19 | Ht 66.0 in

## 2022-03-04 DIAGNOSIS — C170 Malignant neoplasm of duodenum: Secondary | ICD-10-CM

## 2022-03-04 DIAGNOSIS — Z5111 Encounter for antineoplastic chemotherapy: Secondary | ICD-10-CM | POA: Diagnosis not present

## 2022-03-04 MED ORDER — SODIUM CHLORIDE 0.9% FLUSH
10.0000 mL | INTRAVENOUS | Status: DC | PRN
  Administered 2022-03-04: 10 mL

## 2022-03-04 MED ORDER — HEPARIN SOD (PORK) LOCK FLUSH 100 UNIT/ML IV SOLN
500.0000 [IU] | Freq: Once | INTRAVENOUS | Status: AC | PRN
  Administered 2022-03-04: 500 [IU]

## 2022-03-22 MED FILL — Dexamethasone Sodium Phosphate Inj 100 MG/10ML: INTRAMUSCULAR | Qty: 1 | Status: AC

## 2022-03-22 NOTE — Assessment & Plan Note (Signed)
-  he has a personal history of stage pT3N0 colon cancer in 2000 and prostate cancer dx in 2015, s/p 8 weeks of IMRT with Dr. Murray. -genetic testing and counseling on 04/07/21 confirmed a PMS2 mutation, as well as VUS in ATM, BLM, and SDHA. 

## 2022-03-22 NOTE — Progress Notes (Signed)
[place]   [phone] [phone]   Clinic Follow up Note   Patient Care Team: Biagio Borg, MD as PCP - General Stanford Breed, Denice Bors, MD as PCP - Cardiology (Cardiology) [name], MD as Consulting Physician (Gastroenterology) [name], MD as Consulting Physician (Orthopedic Surgery) [name], MD as Consulting Physician (Ophthalmology) [name], MD as Consulting Physician (Oncology)  Date of Service:  03/23/2022  CHIEF COMPLAINT: f/u of  duodenal cancer      CURRENT THERAPY:  FOLFOX, q14d, starting 11/24/21     ASSESSMENT:  Aaron Moss is a 77 y.o. 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 Treatment delayed due to his stroke on 04/18/21. S/p 8 cycles neoadjuvant Keytruda 05/11/21 - 09/28/21. He tolerated well with mild fatigue.  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 -He began FOLFOX on 11/24/21. He has tolerated well overall with some taste changes and mild fatigue -restaging CT scan 02/20/2022 showed PR  PMS2-related Lynch syndrome (HNPCC4) -he has a personal history of stage pT3N0 colon cancer in 2000 and prostate cancer dx in 2015, s/p 8 weeks of IMRT with Dr. Valere Dross. -genetic testing and counseling on 04/07/21 confirmed a PMS2 mutation, as well as VUS in ATM, BLM, and SDHA.    PLAN: - Reduce oxaliplatin dose of chemo 10% due to worsening fatigue  -proceed with C8 FOLFOX at reduce dose. 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 Simple appearing right renal cyst. 2. Otherwise negative abdominal ultrasound   03/18/2021 Procedure   Upper Endoscopy/Colonoscopy, Dr. Loletha Carrow  Upper Endoscopy Impression: - Normal esophagus. - A  single submucosal papule (nodule) found in the stomach. - Duodenal mass. Biopsied. Concerning for malignancy.  Colonoscopy Impression: - Diverticulosis in the entire examined colon. - The examined portion of the ileum was normal. - Patent end-to-end colo-colonic anastomosis, characterized by healthy appearing mucosa. - Internal hemorrhoids. - No specimens collected.   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 Circumferential wall thickening of the first portion of the duodenum, in keeping with known primary malignancy. 2. Enlarged, hypodense lymph nodes anterior to the pancreatic head and in the portacaval station, concerning for nodal metastatic disease. 3. No other evidence of metastatic disease in the chest, abdomen, or pelvis. 4. Incidental note of a fluid attenuation lesion within the proximal pancreatic body measuring 1.9 x 1.4 cm, with prominence of the pancreatic duct distally, up to 0.6 cm. This lesion was present on remote prior examination dated 05/04/2009, and has slightly increased in size over a long period of time. This is consistent with a small IPMN and benign given indolent behavior over greater than 10 years. 5. Background of very fine centrilobular pulmonary nodules, most concentrated in the lung apices, most commonly seen in smoking-related respiratory bronchiolitis. 6. Coronary artery disease.   Aortic  Atherosclerosis (ICD10-I70.0).    Genetic Testing   [facility]-Expanded identified a single  pathogenic variant in the PMS2 gene. 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 Of note, a variant of uncertain significance was detected in the ATM (p.A869G), BLM (c.800-3T>G), and SDHA (p.V632F) genes. Report date is 04/27/2021.  The CancerNext-Expanded gene panel offered by [facility] and includes sequencing, rearrangement, and RNA analysis for the following 77 genes: AIP, ALK, APC, ATM, AXIN2, BAP1, BARD1, BLM, BMPR1A, BRCA1, BRCA2, BRIP1, CDC73, CDH1, CDK4, CDKN1B, CDKN2A, CHEK2, CTNNA1, DICER1, FANCC, FH, FLCN, GALNT12, KIF1B, LZTR1, MAX, MEN1, MET, MLH1, MSH2, MSH3, MSH6, MUTYH, NBN, NF1, NF2, NTHL1, PALB2, PHOX2B, PMS2, POT1, PRKAR1A, PTCH1, PTEN, RAD51C, RAD51D, RB1, RECQL, RET, SDHA, SDHAF2, SDHB, SDHC, SDHD, SMAD4, SMARCA4, SMARCB1, SMARCE1, STK11, SUFU, TMEM127, TP53, TSC1, TSC2, VHL and XRCC2 (sequencing and deletion/duplication); EGFR, EGLN1, HOXB13, KIT, MITF, PDGFRA, POLD1, and POLE (sequencing only); EPCAM and GREM1 (deletion/duplication only).    05/11/2021 - 09/28/2021 Chemotherapy   Patient is on Treatment Plan : COLORECTAL Pembrolizumab (200) q21d     11/24/2021 -  Chemotherapy   Patient is on Treatment Plan : COLORECTAL FOLFOX q14d x 4 months      Imaging     02/20/2022 Imaging    IMPRESSION: 1. Distal stomach/duodenal mass is decreased in size when compared with prior exam. 2. Stable upper abdominal adenopathy. 3. 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 4. Aortic Atherosclerosis (ICD10-I70.0).      INTERVAL HISTORY:  Aaron Moss is here for a follow up of duodenal cancer     He was last seen by me on 02/23/22 He presents to the clinic accompanied by wife. Pt wife said fatigue is still there. After treatment pt states he energy level be at 90%. Right now the Pt reports his energy level is at 70%Pyt appetite is well.    All other systems  were reviewed with the patient and are negative.  MEDICAL HISTORY:  Past Medical History:  Diagnosis Date   Anemia 10/31/2021   Dx IDA   Carotid stenosis, left    Colon cancer ([place]) 2000   Coronary artery disease    per Dr. Jacalyn Lefevre note from 04/13/21   Diverticulosis of colon    Duodenal cancer ([place]) 01/2021   GERD (gastroesophageal reflux disease)    Glaucoma    History of chemotherapy 2000   colon cancer   History of radiation therapy 08/06/13- 10/03/13   prostate 7800 cGy 40 sessions, seminal vesicles 5600 cGy 40 sessions   Hyperlipidemia    Hypertension    Left knee DJD    Prostate cancer ([place]) 05/23/2013   gleason 3+4=7, volume 11.5 ml   Stroke ([place]) 04/18/2021   TIA (transient ischemic attack)     SURGICAL HISTORY: Past Surgical History:  Procedure Laterality Date   CHOLECYSTECTOMY N/A 10/19/2021   Procedure: CHOLECYSTECTOMY;  Surgeon: [name], MD;  Location: [facility];  Service: General;  Laterality: N/A;   COLON SURGERY  2000   colon cancer   COLONOSCOPY     ENDARTERECTOMY Left 04/25/2021   Procedure: LEFT ENDARTERECTOMY CAROTID;  Surgeon: Marty Heck, MD;  Location: [facility];  Service: Vascular;  Laterality: Left;   EUS     pancreatic cyst   KNEE ARTHROSCOPY  2007   LT- [facility]   LAPAROSCOPY N/A 10/19/2021   Procedure: LAPAROSCOPY DIAGNOSTIC;  Surgeon: [name], MD;  Location: [facility];  Service: General;  Laterality: N/A;  GENERAL AND TAP BLOCK   PATCH ANGIOPLASTY Left 04/25/2021   Procedure: PATCH ANGIOPLASTY XENOSURE 1CM X 6CM;  Surgeon: Marty Heck, MD;  Location: [facility];  Service: Vascular;  Laterality: Left;   PORTACATH PLACEMENT Left 11/17/2021   Procedure: PORT PLACEMENT;  Surgeon: [name], MD;  Location: [facility];  Service: General;  Laterality: Left;   PROSTATE BIOPSY  05/23/13   gleason 7, volume 11.5 ml   WHIPPLE PROCEDURE N/A 10/19/2021   Procedure: ATTEMPTED WHIPPLE PROCEDURE  WITH GASTRODUODENAL BYPASS;  Surgeon: [name], MD;   Location: [facility];  Service: General;  Laterality: N/A;  GENERAL AND TAP BLOCK    I have reviewed the social history and family history with the patient and they are unchanged from previous note.  ALLERGIES:  is allergic to lyrica [pregabalin].  MEDICATIONS:  Current Outpatient Medications  Medication Sig Dispense Refill   amLODipine (NORVASC) 10 MG tablet Take 1 tablet by mouth once daily 90 tablet 3   aspirin EC 81 MG EC tablet Take 1 tablet (81 mg total) by mouth daily. Swallow whole. 30 tablet 11   atorvastatin (LIPITOR) 40 MG tablet Take 1 tablet by mouth once daily 90 tablet 2   benazepril (LOTENSIN) 40 MG tablet Take 1 tablet by mouth once daily 90 tablet 3   carvedilol (COREG) 12.5 MG tablet Take 12.5 mg by mouth 2 (two) times daily.     iron polysaccharides (FERREX 150) 150 MG capsule Take 1 capsule by mouth twice daily 180 capsule 1   KLOR-CON M20 20 MEQ tablet Take 20 mEq by mouth 3 (three) times daily.     lidocaine-prilocaine (EMLA) cream Apply to affected area once 30 g 3   pantoprazole (PROTONIX) 40 MG tablet Take 1 tablet by mouth once daily 90 tablet 0   potassium chloride 20 MEQ TBCR Take 20 mEq by mouth 3 (three) times daily. 90 tablet 0   prochlorperazine (COMPAZINE) 10 MG tablet Take 1 tablet (10 mg total) by mouth every 6 (six) hours as needed for nausea or vomiting (Use for nausea and / or vomiting unresolved with ondansetron (Zofran).). (Patient not taking: Reported on 11/16/2021) 30 tablet 0   traMADol (ULTRAM) 50 MG tablet TAKE 1 TABLET BY MOUTH EVERY 6 HOURS AS NEEDED FOR PAIN 120 tablet 1   Travoprost, BAK Free, (TRAVATAN) 0.004 % SOLN ophthalmic solution Place 1 drop into both eyes at bedtime.     VITAMIN D PO Take 2 tablets by mouth daily. Gummies     No current facility-administered medications for this visit.   Facility-Administered Medications Ordered in Other Visits  Medication Dose Route Frequency Provider Last Rate Last Admin   fluorouracil (ADRUCIL)  5,000 mg in sodium chloride 0.9 % 150 mL chemo infusion  2,375 mg/m2 (Treatment Plan Recorded) Intravenous 1 day or 1 dose [name], MD   Infusion Verify at 03/23/22 1458    PHYSICAL EXAMINATION: ECOG PERFORMANCE STATUS: 1 - Symptomatic but completely ambulatory  Vitals:   03/23/22 1121  BP: (!) 146/60  Pulse: 66  Resp: 15  Temp: 98.4 F (36.9 C)  SpO2: 100%   Wt Readings from Last 3 Encounters:  03/23/22 204 lb 9.6 oz (92.8 kg)  03/02/22 201 lb 12.8 oz (91.5 kg)  02/16/22 204 lb 9.6 oz (92.8 kg)     GENERAL:alert, no distress and comfortable SKIN: skin color normal, no rashes or significant lesions EYES: normal, Conjunctiva are pink  and non-injected, sclera clear  NEURO: alert & oriented x 3 with fluent speech LABORATORY DATA:  I have reviewed the data as listed    Latest Ref Rng & Units 03/23/2022   10:11 AM 03/02/2022   10:08 AM 02/16/2022   10:54 AM  CBC  WBC 4.0 - 10.5 K/uL 5.6  4.6  5.4   Hemoglobin 13.0 - 17.0 g/dL 12.2  11.4  11.9   Hematocrit 39.0 - 52.0 % 37.7  36.0  37.6   Platelets 150 - 400 K/uL 159  110  199         Latest Ref Rng & Units 03/23/2022   10:11 AM 03/02/2022   10:08 AM 02/16/2022   10:54 AM  CMP  Glucose 70 - 99 mg/dL 101  94  95   BUN 8 - 23 mg/dL _0 Creatinine 0.61 - 1.24 mg/dL 0.93  0.81  0.88   Sodium 135 - 145 mmol/L 141  143  142   Potassium 3.5 - 5.1 mmol/L 3.9  3.8  4.1   Chloride 98 - 111 mmol/L 109  111  109   CO2 22 - 32 mmol/L _1 Calcium 8.9 - 10.3 mg/dL 9.0  9.0  9.0   Total Protein 6.5 - 8.1 g/dL 5.6  5.5  6.0   Total Bilirubin 0.3 - 1.2 mg/dL 0.7  0.8  0.7   Alkaline Phos 38 - 126 U/L 110  99  103   AST 15 - 41 U/L 37  25  46   ALT 0 - 44 U/L 52  27  73       RADIOGRAPHIC STUDIES: I have personally reviewed the radiological images as listed and agreed with the findings in the report. No results found.    Orders Placed This Encounter  Procedures   CBC with Differential ([facility] Only)     Standing Status:   Future    Standing Expiration Date:   04/21/2023   CMP ([facility] only)    Standing Status:   Future    Standing Expiration Date:   04/21/2023   CBC with Differential ([facility] Only)    Standing Status:   Future    Standing Expiration Date:   04/07/2023   CMP ([place] [facility] only)    Standing Status:   Future    Standing Expiration Date:   04/07/2023   All questions were answered. The patient knows to call the clinic with any problems, questions or concerns. No barriers to learning was detected. The total time spent in the appointment was 30 minutes.     [name], MD 03/23/2022   Felicity Coyer, CMA, am acting as scribe for [name], MD.   I have reviewed the above documentation for accuracy and completeness, and I agree with the above.

## 2022-03-22 NOTE — Assessment & Plan Note (Addendum)
-  diagnosed 02/2021 by endo-/colonoscopy for weight loss and anemia. Treatment delayed due to his stroke on 04/18/21. S/p 8 cycles neoadjuvant Keytruda 05/11/21 - 09/28/21. He tolerated well with mild fatigue.  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 -He began FOLFOX on 11/24/21. He has tolerated well overall with some taste changes and mild fatigue -restaging CT scan 02/20/2022 showed PR

## 2022-03-23 ENCOUNTER — Encounter: Payer: Self-pay | Admitting: Hematology

## 2022-03-23 ENCOUNTER — Inpatient Hospital Stay: Payer: Medicare Other

## 2022-03-23 ENCOUNTER — Other Ambulatory Visit: Payer: Self-pay

## 2022-03-23 ENCOUNTER — Inpatient Hospital Stay: Payer: Medicare Other | Attending: Nurse Practitioner

## 2022-03-23 ENCOUNTER — Inpatient Hospital Stay: Payer: Medicare Other | Admitting: Hematology

## 2022-03-23 VITALS — BP 146/60 | HR 66 | Temp 98.4°F | Resp 15 | Ht 66.0 in | Wt 204.6 lb

## 2022-03-23 DIAGNOSIS — Z5111 Encounter for antineoplastic chemotherapy: Secondary | ICD-10-CM | POA: Diagnosis present

## 2022-03-23 DIAGNOSIS — Z1509 Genetic susceptibility to other malignant neoplasm: Secondary | ICD-10-CM | POA: Diagnosis not present

## 2022-03-23 DIAGNOSIS — C170 Malignant neoplasm of duodenum: Secondary | ICD-10-CM

## 2022-03-23 DIAGNOSIS — D508 Other iron deficiency anemias: Secondary | ICD-10-CM | POA: Insufficient documentation

## 2022-03-23 DIAGNOSIS — Z90411 Acquired partial absence of pancreas: Secondary | ICD-10-CM | POA: Insufficient documentation

## 2022-03-23 DIAGNOSIS — Z923 Personal history of irradiation: Secondary | ICD-10-CM | POA: Insufficient documentation

## 2022-03-23 DIAGNOSIS — Z79899 Other long term (current) drug therapy: Secondary | ICD-10-CM | POA: Diagnosis not present

## 2022-03-23 DIAGNOSIS — Z8546 Personal history of malignant neoplasm of prostate: Secondary | ICD-10-CM | POA: Diagnosis not present

## 2022-03-23 DIAGNOSIS — D500 Iron deficiency anemia secondary to blood loss (chronic): Secondary | ICD-10-CM

## 2022-03-23 DIAGNOSIS — Z7982 Long term (current) use of aspirin: Secondary | ICD-10-CM | POA: Diagnosis not present

## 2022-03-23 LAB — CBC WITH DIFFERENTIAL (CANCER CENTER ONLY)
Abs Immature Granulocytes: 0.02 10*3/uL (ref 0.00–0.07)
Basophils Absolute: 0 10*3/uL (ref 0.0–0.1)
Basophils Relative: 1 %
Eosinophils Absolute: 0.2 10*3/uL (ref 0.0–0.5)
Eosinophils Relative: 3 %
HCT: 37.7 % — ABNORMAL LOW (ref 39.0–52.0)
Hemoglobin: 12.2 g/dL — ABNORMAL LOW (ref 13.0–17.0)
Immature Granulocytes: 0 %
Lymphocytes Relative: 20 %
Lymphs Abs: 1.1 10*3/uL (ref 0.7–4.0)
MCH: 26.3 pg (ref 26.0–34.0)
MCHC: 32.4 g/dL (ref 30.0–36.0)
MCV: 81.4 fL (ref 80.0–100.0)
Monocytes Absolute: 0.8 10*3/uL (ref 0.1–1.0)
Monocytes Relative: 14 %
Neutro Abs: 3.4 10*3/uL (ref 1.7–7.7)
Neutrophils Relative %: 62 %
Platelet Count: 159 10*3/uL (ref 150–400)
RBC: 4.63 MIL/uL (ref 4.22–5.81)
RDW: 19.1 % — ABNORMAL HIGH (ref 11.5–15.5)
WBC Count: 5.6 10*3/uL (ref 4.0–10.5)
nRBC: 0 % (ref 0.0–0.2)

## 2022-03-23 LAB — CMP (CANCER CENTER ONLY)
ALT: 52 U/L — ABNORMAL HIGH (ref 0–44)
AST: 37 U/L (ref 15–41)
Albumin: 3.2 g/dL — ABNORMAL LOW (ref 3.5–5.0)
Alkaline Phosphatase: 110 U/L (ref 38–126)
Anion gap: 4 — ABNORMAL LOW (ref 5–15)
BUN: 16 mg/dL (ref 8–23)
CO2: 28 mmol/L (ref 22–32)
Calcium: 9 mg/dL (ref 8.9–10.3)
Chloride: 109 mmol/L (ref 98–111)
Creatinine: 0.93 mg/dL (ref 0.61–1.24)
GFR, Estimated: >60 mL/min (ref >60)
Glucose, Bld: 101 mg/dL — ABNORMAL HIGH (ref 70–99)
Potassium: 3.9 mmol/L (ref 3.5–5.1)
Sodium: 141 mmol/L (ref 135–145)
Total Bilirubin: 0.7 mg/dL (ref 0.3–1.2)
Total Protein: 5.6 g/dL — ABNORMAL LOW (ref 6.5–8.1)

## 2022-03-23 LAB — CEA (IN HOUSE-CHCC): CEA (CHCC-In House): 7.74 ng/mL — ABNORMAL HIGH (ref 0.00–5.00)

## 2022-03-23 MED ORDER — OXALIPLATIN CHEMO INJECTION 100 MG/20ML
60.0000 mg/m2 | Freq: Once | INTRAVENOUS | Status: AC
  Administered 2022-03-23: 125 mg via INTRAVENOUS
  Filled 2022-03-23: qty 25

## 2022-03-23 MED ORDER — DEXTROSE 5 % IV SOLN: Freq: Once | INTRAVENOUS | Status: AC

## 2022-03-23 MED ORDER — SODIUM CHLORIDE 0.9 % IV SOLN
2375.0000 mg/m2 | INTRAVENOUS | Status: DC
  Administered 2022-03-23: 5000 mg via INTRAVENOUS
  Filled 2022-03-23: qty 100

## 2022-03-23 MED ORDER — SODIUM CHLORIDE 0.9% FLUSH
10.0000 mL | Freq: Once | INTRAVENOUS | Status: AC | PRN
  Administered 2022-03-23: 10 mL

## 2022-03-23 MED ORDER — PALONOSETRON HCL INJECTION 0.25 MG/5ML
0.2500 mg | Freq: Once | INTRAVENOUS | Status: AC
  Administered 2022-03-23: 0.25 mg via INTRAVENOUS
  Filled 2022-03-23: qty 5

## 2022-03-23 MED ORDER — SODIUM CHLORIDE 0.9 % IV SOLN
10.0000 mg | Freq: Once | INTRAVENOUS | Status: AC
  Administered 2022-03-23: 10 mg via INTRAVENOUS
  Filled 2022-03-23: qty 10

## 2022-03-23 MED ORDER — LEUCOVORIN CALCIUM INJECTION 350 MG
400.0000 mg/m2 | Freq: Once | INTRAVENOUS | Status: AC
  Administered 2022-03-23: 844 mg via INTRAVENOUS
  Filled 2022-03-23: qty 42.2

## 2022-03-24 ENCOUNTER — Other Ambulatory Visit: Payer: Self-pay

## 2022-03-24 ENCOUNTER — Telehealth: Payer: Self-pay | Admitting: Hematology

## 2022-03-24 NOTE — Telephone Encounter (Signed)
Spoke with patient confirming upcoming appointments  

## 2022-03-25 ENCOUNTER — Inpatient Hospital Stay: Payer: Medicare Other

## 2022-03-25 ENCOUNTER — Other Ambulatory Visit: Payer: Self-pay

## 2022-03-25 VITALS — BP 134/73 | HR 69 | Temp 98.1°F | Resp 14

## 2022-03-25 DIAGNOSIS — Z5111 Encounter for antineoplastic chemotherapy: Secondary | ICD-10-CM | POA: Diagnosis not present

## 2022-03-25 DIAGNOSIS — C170 Malignant neoplasm of duodenum: Secondary | ICD-10-CM

## 2022-03-25 MED ORDER — HEPARIN SOD (PORK) LOCK FLUSH 100 UNIT/ML IV SOLN
500.0000 [IU] | Freq: Once | INTRAVENOUS | Status: AC | PRN
  Administered 2022-03-25: 500 [IU]

## 2022-03-25 MED ORDER — SODIUM CHLORIDE 0.9% FLUSH
10.0000 mL | INTRAVENOUS | Status: DC | PRN
  Administered 2022-03-25: 10 mL

## 2022-03-26 ENCOUNTER — Other Ambulatory Visit: Payer: Self-pay

## 2022-04-05 NOTE — Progress Notes (Signed)
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 Simple appearing right renal cyst. 2. Otherwise negative abdominal ultrasound   03/18/2021 Procedure   Upper Endoscopy/Colonoscopy, Dr. Loletha Carrow  Upper Endoscopy Impression: - Normal esophagus. - A single submucosal papule (nodule) found in the stomach. - Duodenal mass. Biopsied. Concerning for malignancy.  Colonoscopy Impression: - Diverticulosis in the entire examined colon. - The examined portion of the ileum was normal. - Patent end-to-end colo-colonic anastomosis, characterized by healthy appearing mucosa. - Internal hemorrhoids. - No specimens collected.   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 Circumferential wall thickening of the first portion of the duodenum, in keeping with known primary malignancy. 2. Enlarged, hypodense lymph nodes anterior to the pancreatic head and in the portacaval station, concerning for nodal metastatic disease. 3. No other evidence of metastatic disease in the chest, abdomen, or pelvis. 4. Incidental note of a fluid attenuation lesion within the proximal pancreatic body measuring 1.9 x 1.4 cm, with prominence of the pancreatic duct distally, up to 0.6 cm. This lesion was present on remote prior examination dated  05/04/2009, and has slightly increased in size over a long period of time. This is consistent with a small IPMN and benign given indolent behavior over greater than 10 years. 5. Background of very fine centrilobular pulmonary nodules, most concentrated in the lung apices, most commonly seen in smoking-related respiratory bronchiolitis. 6. Coronary artery disease.   Aortic Atherosclerosis (ICD10-I70.0).    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 Of note, a variant of uncertain significance was detected in the ATM (p.A869G), BLM (c.800-3T>G), and SDHA (p.V632F) genes. Report date is 04/27/2021.  The CancerNext-Expanded gene panel offered by [facility] and includes sequencing, rearrangement, and RNA analysis for the following 77 genes: AIP, ALK, APC, ATM, AXIN2, BAP1, BARD1, BLM, BMPR1A, BRCA1, BRCA2, BRIP1, CDC73, CDH1, CDK4, CDKN1B, CDKN2A, CHEK2, CTNNA1, DICER1, FANCC, FH, FLCN, GALNT12, KIF1B, LZTR1, MAX, MEN1, MET, MLH1, MSH2, MSH3, MSH6, MUTYH, NBN, NF1, NF2, NTHL1, PALB2, PHOX2B, PMS2, POT1, PRKAR1A, PTCH1, PTEN, RAD51C, RAD51D, RB1, RECQL, RET, SDHA, SDHAF2, SDHB, SDHC, SDHD, SMAD4, SMARCA4, SMARCB1, SMARCE1, STK11, SUFU, TMEM127, TP53, TSC1, TSC2, VHL and XRCC2 (sequencing and deletion/duplication); EGFR, EGLN1, HOXB13, KIT, MITF, PDGFRA, POLD1, and POLE (sequencing only); EPCAM and GREM1 (deletion/duplication only).    05/11/2021 - 09/28/2021 Chemotherapy   Patient is on Treatment Plan : COLORECTAL Pembrolizumab (200) q21d     11/24/2021 -  Chemotherapy   Patient is on Treatment Plan : COLORECTAL FOLFOX q14d x 4 months      Imaging     02/20/2022 Imaging    IMPRESSION: 1. Distal stomach/duodenal mass is decreased in size when compared with prior exam. 2. Stable upper abdominal adenopathy. 3. 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 4. Aortic Atherosclerosis (ICD10-I70.0).      CURRENT THERAPY: FOLFOX, q14d, starting 11/24/21    INTERVAL HISTORY: Aaron Moss 77 y.o. male returns to the clinic today for a follow-up visit accompanied by his daughter.  The patient was last seen 2 weeks ago by Dr. Burr Medico.  The patient is currently undergoing treatment with FOLFOX and he tolerates it fairly well except for fatigue.  Dr. 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 He had not noticed appreciable improvement difference with the most recent cycle of treatment. He reports his energy has been good the last two days and he feels pretty good today. He overall states his fatigue is manageable and able to perform his normal day to day activities.  He denies any fever, chills, or night sweats.  Denies any abdominal pain.  Denies any odynophagia or dysphagia except he reports certain pills, like potassium, sometimes feel like they are taking long to "go down". His weight is stable. He is only drinking ensure once a day. He mentions Dr. Burr Medico previously encouraged him to increase this.  Denies any nausea, vomiting, or constipation. He mentions since starting treatment, he may intermittently get diarrhea which resolves without needing to take imodium.  Denies any chest pain, shortness of breath, cough, or hemoptysis.  He denies any jaundice or itching.  He is here today for evaluation repeat blood work before starting the next cycle of treatment.     MEDICAL HISTORY: Past Medical History:  Diagnosis Date   Anemia 10/31/2021   Dx IDA   Carotid stenosis, left    Colon cancer ([place]) 2000   Coronary artery disease    per Dr. Jacalyn Lefevre note from 04/13/21   Diverticulosis of colon    Duodenal cancer ([place]) 01/2021   GERD (gastroesophageal reflux disease)    Glaucoma    History of  chemotherapy 2000   colon cancer   History of radiation therapy 08/06/13- 10/03/13   prostate 7800 cGy 40 sessions, seminal vesicles 5600 cGy 40 sessions   Hyperlipidemia    Hypertension    Left knee DJD    Prostate cancer ([place]) 05/23/2013   gleason 3+4=7, volume 11.5 ml   Stroke ([facility] [place]) 04/18/2021   TIA (transient ischemic attack)     ALLERGIES:  is allergic to lyrica [pregabalin].  MEDICATIONS:  Current Outpatient Medications  Medication Sig Dispense Refill   amLODipine (NORVASC) 10 MG tablet Take 1 tablet by mouth once daily 90 tablet 3   aspirin EC 81 MG EC tablet Take 1 tablet (81 mg total) by mouth daily. Swallow whole. 30 tablet 11   benazepril (LOTENSIN) 40 MG tablet Take 1 tablet by mouth once daily 90 tablet 3   carvedilol (COREG) 12.5 MG tablet Take 12.5 mg by mouth 2 (two) times daily.     iron polysaccharides (FERREX 150) 150 MG capsule Take 1 capsule by mouth twice daily 180 capsule 1   KLOR-CON M20 20 MEQ tablet Take 20 mEq by mouth 3 (three) times daily.     lidocaine-prilocaine (EMLA) cream Apply to affected area once 30 g 3   pantoprazole (PROTONIX) 40 MG tablet Take 1 tablet by mouth once daily 90 tablet 0   potassium chloride 20 MEQ TBCR Take 20 mEq by mouth 3 (three) times daily. 90 tablet 0   prochlorperazine (  COMPAZINE) 10 MG tablet Take 1 tablet (10 mg total) by mouth every 6 (six) hours as needed for nausea or vomiting (Use for nausea and / or vomiting unresolved with ondansetron (Zofran).). 30 tablet 0   Travoprost, BAK Free, (TRAVATAN) 0.004 % SOLN ophthalmic solution Place 1 drop into both eyes at bedtime.     VITAMIN D PO Take 2 tablets by mouth daily. Gummies     atorvastatin (LIPITOR) 40 MG tablet Take 1 tablet by mouth once daily (Patient not taking: Reported on 04/06/2022) 90 tablet 2   No current facility-administered medications for this visit.    SURGICAL HISTORY:  Past Surgical History:  Procedure Laterality Date   CHOLECYSTECTOMY N/A 10/19/2021    Procedure: CHOLECYSTECTOMY;  Surgeon: [name], MD;  Location: [facility];  Service: General;  Laterality: N/A;   COLON SURGERY  2000   colon cancer   COLONOSCOPY     ENDARTERECTOMY Left 04/25/2021   Procedure: LEFT ENDARTERECTOMY CAROTID;  Surgeon: Marty Heck, MD;  Location: [facility];  Service: Vascular;  Laterality: Left;   EUS     pancreatic cyst   KNEE ARTHROSCOPY  2007   LT- [facility]   LAPAROSCOPY N/A 10/19/2021   Procedure: LAPAROSCOPY DIAGNOSTIC;  Surgeon: [name], MD;  Location: [facility];  Service: General;  Laterality: N/A;  GENERAL AND TAP BLOCK   PATCH ANGIOPLASTY Left 04/25/2021   Procedure: PATCH ANGIOPLASTY XENOSURE 1CM X 6CM;  Surgeon: Marty Heck, MD;  Location: [facility];  Service: Vascular;  Laterality: Left;   PORTACATH PLACEMENT Left 11/17/2021   Procedure: PORT PLACEMENT;  Surgeon: [name], MD;  Location: [facility];  Service: General;  Laterality: Left;   PROSTATE BIOPSY  05/23/13   gleason 7, volume 11.5 ml   WHIPPLE PROCEDURE N/A 10/19/2021   Procedure: ATTEMPTED WHIPPLE PROCEDURE  WITH GASTRODUODENAL BYPASS;  Surgeon: [name], MD;  Location: [facility];  Service: General;  Laterality: N/A;  GENERAL AND TAP BLOCK    REVIEW OF SYSTEMS:   Review of Systems  Constitutional: Positive for stable fatigue. Positive for diminished appetite. Negative for chills, fever and unexpected weight change.  HENT: Negative for mouth sores, nosebleeds, sore throat and trouble swallowing.   Eyes: Negative for eye problems and icterus.  Respiratory: Negative for cough, hemoptysis, shortness of breath and wheezing.   Cardiovascular: Negative for chest pain and leg swelling.  Gastrointestinal: Positive for occasional diarrhea (none at this time). Negative for abdominal pain, constipation, nausea and vomiting.  Genitourinary: Negative for bladder incontinence, difficulty urinating, dysuria, frequency and hematuria.   Musculoskeletal: Negative for back pain, gait problem, neck  pain and neck stiffness.  Skin: Negative for itching and rash.  Neurological: Negative for dizziness, extremity weakness, gait problem, headaches, light-headedness and seizures.  Hematological: Negative for adenopathy. Does not bruise/bleed easily.  Psychiatric/Behavioral: Negative for confusion, depression and sleep disturbance. The patient is not nervous/anxious.     PHYSICAL EXAMINATION:  Blood pressure (!) 142/61, pulse 74, temperature 98.2 F (36.8 C), temperature source Oral, resp. rate 14, weight 204 lb 12.8 oz (92.9 kg), SpO2 100 %.  ECOG PERFORMANCE STATUS: 1-2  Physical Exam  Constitutional: Oriented to person, place, and time and well-developed, well-nourished, and in no distress.  HENT:  Head: Normocephalic and atraumatic.  Mouth/Throat: Oropharynx is clear and moist. No oropharyngeal exudate.  Eyes: Conjunctivae are normal. Right eye exhibits no discharge. Left eye exhibits no discharge. No scleral icterus.  Neck: Normal range of motion. Neck supple.  Cardiovascular:  Normal rate, regular rhythm, normal heart sounds and intact distal pulses.   Pulmonary/Chest: Effort normal and breath sounds normal. No respiratory distress. No wheezes. No rales.  Abdominal: Soft. Bowel sounds are normal. Exhibits no distension and no mass. There is no tenderness.  Musculoskeletal: Normal range of motion. Exhibits no edema.  Lymphadenopathy:    No cervical adenopathy.  Neurological: Alert and oriented to person, place, and time. Exhibits normal muscle tone. Gait normal. Coordination normal.  Skin: Skin is warm and dry. No rash noted. Not diaphoretic. No erythema. No pallor.  Psychiatric: Mood, memory and judgment normal.  Vitals reviewed.  LABORATORY DATA: Lab Results  Component Value Date   WBC 5.3 04/06/2022   HGB 11.2 (L) 04/06/2022   HCT 34.5 (L) 04/06/2022   MCV 82.3 04/06/2022   PLT 122 (L) 04/06/2022      Chemistry      Component Value Date/Time   NA 141 04/06/2022 1056    K 3.7 04/06/2022 1056   CL 109 04/06/2022 1056   CO2 28 04/06/2022 1056   BUN 16 04/06/2022 1056   CREATININE 0.93 04/06/2022 1056      Component Value Date/Time   CALCIUM 8.7 (L) 04/06/2022 1056   ALKPHOS 90 04/06/2022 1056   AST 24 04/06/2022 1056   ALT 23 04/06/2022 1056   BILITOT 1.0 04/06/2022 1056       RADIOGRAPHIC STUDIES:  No results found.   ASSESSMENT/PLAN:  Aaron Moss is a 77 y.o. 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 Treatment delayed due to his stroke on 04/18/21. S/p 8 cycles neoadjuvant Keytruda 05/11/21 - 09/28/21. He tolerated well with mild fatigue.  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 -He began FOLFOX on 11/24/21. He has tolerated well overall with some taste changes and mild fatigue -restaging CT scan 02/20/2022 showed PR -Labs were reviewed. Recommend he continue on the same treatment at the same dose with the 10% dose reduction. He is concerned if the dose is reduced further if that will impact treatment effectiveness. He reports his fatigue is manageable.  -We will see him back for a follow up visit in 2 weeks for evaluation and repeat blood work before starting cycle #10. Discussed increasing protein drinks in diet.    PMS2-related Lynch syndrome (HNPCC4) -he has a personal history of stage pT3N0 colon cancer in 2000 and prostate cancer dx in 2015, s/p 8 weeks of IMRT with Dr. Valere Dross. -genetic testing and counseling on 04/07/21 confirmed a PMS2 mutation, as well as VUS in ATM, BLM, and SDHA.       PLAN: -proceed with C9 FOLFOX at continued reduce dose. -lab, f/u and cycle 10 chemo in 2 weeks       No orders of the defined types were placed in this encounter.     The total time spent in the appointment was 20-29 minutes.   Sumeet Geter L Anahy Esh, PA-C 04/06/22

## 2022-04-06 ENCOUNTER — Encounter: Payer: Self-pay | Admitting: Nurse Practitioner

## 2022-04-06 ENCOUNTER — Inpatient Hospital Stay: Payer: Medicare Other

## 2022-04-06 ENCOUNTER — Inpatient Hospital Stay (HOSPITAL_BASED_OUTPATIENT_CLINIC_OR_DEPARTMENT_OTHER): Payer: Medicare Other | Admitting: Physician Assistant

## 2022-04-06 ENCOUNTER — Other Ambulatory Visit: Payer: Self-pay

## 2022-04-06 VITALS — BP 165/69 | HR 72 | Temp 97.9°F | Resp 16

## 2022-04-06 DIAGNOSIS — Z95828 Presence of other vascular implants and grafts: Secondary | ICD-10-CM

## 2022-04-06 DIAGNOSIS — C170 Malignant neoplasm of duodenum: Secondary | ICD-10-CM | POA: Diagnosis not present

## 2022-04-06 DIAGNOSIS — D500 Iron deficiency anemia secondary to blood loss (chronic): Secondary | ICD-10-CM

## 2022-04-06 DIAGNOSIS — Z5111 Encounter for antineoplastic chemotherapy: Secondary | ICD-10-CM | POA: Diagnosis not present

## 2022-04-06 LAB — CBC WITH DIFFERENTIAL (CANCER CENTER ONLY)
Abs Immature Granulocytes: 0.01 10*3/uL (ref 0.00–0.07)
Basophils Absolute: 0 10*3/uL (ref 0.0–0.1)
Basophils Relative: 0 %
Eosinophils Absolute: 0.2 10*3/uL (ref 0.0–0.5)
Eosinophils Relative: 4 %
HCT: 34.5 % — ABNORMAL LOW (ref 39.0–52.0)
Hemoglobin: 11.2 g/dL — ABNORMAL LOW (ref 13.0–17.0)
Immature Granulocytes: 0 %
Lymphocytes Relative: 21 %
Lymphs Abs: 1.1 10*3/uL (ref 0.7–4.0)
MCH: 26.7 pg (ref 26.0–34.0)
MCHC: 32.5 g/dL (ref 30.0–36.0)
MCV: 82.3 fL (ref 80.0–100.0)
Monocytes Absolute: 0.8 10*3/uL (ref 0.1–1.0)
Monocytes Relative: 14 %
Neutro Abs: 3.2 10*3/uL (ref 1.7–7.7)
Neutrophils Relative %: 61 %
Platelet Count: 122 10*3/uL — ABNORMAL LOW (ref 150–400)
RBC: 4.19 MIL/uL — ABNORMAL LOW (ref 4.22–5.81)
RDW: 18.3 % — ABNORMAL HIGH (ref 11.5–15.5)
WBC Count: 5.3 10*3/uL (ref 4.0–10.5)
nRBC: 0 % (ref 0.0–0.2)

## 2022-04-06 LAB — CMP (CANCER CENTER ONLY)
ALT: 23 U/L (ref 0–44)
AST: 24 U/L (ref 15–41)
Albumin: 3 g/dL — ABNORMAL LOW (ref 3.5–5.0)
Alkaline Phosphatase: 90 U/L (ref 38–126)
Anion gap: 4 — ABNORMAL LOW (ref 5–15)
BUN: 16 mg/dL (ref 8–23)
CO2: 28 mmol/L (ref 22–32)
Calcium: 8.7 mg/dL — ABNORMAL LOW (ref 8.9–10.3)
Chloride: 109 mmol/L (ref 98–111)
Creatinine: 0.93 mg/dL (ref 0.61–1.24)
GFR, Estimated: >60 mL/min (ref >60)
Glucose, Bld: 86 mg/dL (ref 70–99)
Potassium: 3.7 mmol/L (ref 3.5–5.1)
Sodium: 141 mmol/L (ref 135–145)
Total Bilirubin: 1 mg/dL (ref 0.3–1.2)
Total Protein: 5.5 g/dL — ABNORMAL LOW (ref 6.5–8.1)

## 2022-04-06 MED ORDER — SODIUM CHLORIDE 0.9% FLUSH
10.0000 mL | INTRAVENOUS | Status: DC | PRN
  Administered 2022-04-06: 10 mL

## 2022-04-06 MED ORDER — SODIUM CHLORIDE 0.9 % IV SOLN
10.0000 mg | Freq: Once | INTRAVENOUS | Status: AC
  Administered 2022-04-06: 10 mg via INTRAVENOUS
  Filled 2022-04-06: qty 10

## 2022-04-06 MED ORDER — DEXTROSE 5 % IV SOLN: Freq: Once | INTRAVENOUS | Status: AC

## 2022-04-06 MED ORDER — LEUCOVORIN CALCIUM INJECTION 350 MG
400.0000 mg/m2 | Freq: Once | INTRAVENOUS | Status: AC
  Administered 2022-04-06: 844 mg via INTRAVENOUS
  Filled 2022-04-06: qty 42.2

## 2022-04-06 MED ORDER — SODIUM CHLORIDE 0.9% FLUSH
10.0000 mL | Freq: Once | INTRAVENOUS | Status: AC
  Administered 2022-04-06: 10 mL

## 2022-04-06 MED ORDER — SODIUM CHLORIDE 0.9 % IV SOLN
2375.0000 mg/m2 | INTRAVENOUS | Status: DC
  Administered 2022-04-06: 5000 mg via INTRAVENOUS
  Filled 2022-04-06: qty 100

## 2022-04-06 MED ORDER — OXALIPLATIN CHEMO INJECTION 100 MG/20ML
60.0000 mg/m2 | Freq: Once | INTRAVENOUS | Status: AC
  Administered 2022-04-06: 125 mg via INTRAVENOUS
  Filled 2022-04-06: qty 25

## 2022-04-06 MED ORDER — PALONOSETRON HCL INJECTION 0.25 MG/5ML
0.2500 mg | Freq: Once | INTRAVENOUS | Status: AC
  Administered 2022-04-06: 0.25 mg via INTRAVENOUS
  Filled 2022-04-06: qty 5

## 2022-04-06 MED ORDER — HEPARIN SOD (PORK) LOCK FLUSH 100 UNIT/ML IV SOLN: 500.0000 [IU] | Freq: Once | INTRAVENOUS | Status: DC | PRN

## 2022-04-06 NOTE — Patient Instructions (Signed)
[facility] ONCOLOGY  Discharge Instructions: Thank you for choosing [facility] to provide your oncology and hematology care.   If you have a lab appointment with the [facility], please go directly to the [facility] and check in at the registration area.   Wear comfortable clothing and clothing appropriate for easy access to any Portacath or PICC line.   We strive to give you quality time with your provider. You may need to reschedule your appointment if you arrive late (15 or more minutes).  Arriving late affects you and other patients whose appointments are after yours.  Also, if you miss three or more appointments without notifying the office, you may be dismissed from the clinic at the provider's discretion.      For prescription refill requests, have your pharmacy contact our office and allow 72 hours for refills to be completed.    Today you received the following chemotherapy and/or immunotherapy agents: Oxaliplatin, Leucovorin, Fluorouracil.       To help prevent nausea and vomiting after your treatment, we encourage you to take your nausea medication as directed.  BELOW ARE SYMPTOMS THAT SHOULD BE REPORTED IMMEDIATELY: *FEVER GREATER THAN 100.4 F (38 C) OR HIGHER *CHILLS OR SWEATING *NAUSEA AND VOMITING THAT IS NOT CONTROLLED WITH YOUR NAUSEA MEDICATION *UNUSUAL SHORTNESS OF BREATH *UNUSUAL BRUISING OR BLEEDING *URINARY PROBLEMS (pain or burning when urinating, or frequent urination) *BOWEL PROBLEMS (unusual diarrhea, constipation, pain near the anus) TENDERNESS IN MOUTH AND THROAT WITH OR WITHOUT PRESENCE OF ULCERS (sore throat, sores in mouth, or a toothache) UNUSUAL RASH, SWELLING OR PAIN  UNUSUAL VAGINAL DISCHARGE OR ITCHING   Items with * indicate a potential emergency and should be followed up as soon as possible or go to the Emergency Department if any problems should occur.  Please show the CHEMOTHERAPY ALERT CARD or  IMMUNOTHERAPY ALERT CARD at check-in to the Emergency Department and triage nurse.  Should you have questions after your visit or need to cancel or reschedule your appointment, please contact [facility]  Dept: [phone]  and follow the prompts.  Office hours are 8:00 a.m. to 4:30 p.m. Monday - Friday. Please note that voicemails left after 4:00 p.m. may not be returned until the following business day.  We are closed weekends and major holidays. You have access to a nurse at all times for urgent questions. Please call the main number to the clinic Dept: [phone] and follow the prompts.   For any non-urgent questions, you may also contact your provider using MyChart. We now offer e-Visits for anyone 84 and older to request care online for non-urgent symptoms. For details visit [email or web address].   Also download the MyChart app! Go to the app store, search "MyChart", open the app, select [facility], and log in with your MyChart username and password.  Masks are optional in the cancer centers. If you would like for your care team to wear a mask while they are taking care of you, please let them know. You may have one support person who is at least 79 years old accompany you for your appointments.

## 2022-04-08 ENCOUNTER — Inpatient Hospital Stay: Payer: Medicare Other

## 2022-04-08 VITALS — BP 127/58 | HR 69 | Temp 97.7°F | Resp 14

## 2022-04-08 DIAGNOSIS — Z5111 Encounter for antineoplastic chemotherapy: Secondary | ICD-10-CM | POA: Diagnosis not present

## 2022-04-08 DIAGNOSIS — C170 Malignant neoplasm of duodenum: Secondary | ICD-10-CM

## 2022-04-08 MED ORDER — SODIUM CHLORIDE 0.9% FLUSH
10.0000 mL | INTRAVENOUS | Status: DC | PRN
  Administered 2022-04-08: 10 mL

## 2022-04-08 MED ORDER — HEPARIN SOD (PORK) LOCK FLUSH 100 UNIT/ML IV SOLN
500.0000 [IU] | Freq: Once | INTRAVENOUS | Status: AC | PRN
  Administered 2022-04-08: 500 [IU]

## 2022-04-19 MED FILL — Dexamethasone Sodium Phosphate Inj 100 MG/10ML: INTRAMUSCULAR | Qty: 1 | Status: AC

## 2022-04-19 NOTE — Assessment & Plan Note (Signed)
-  diagnosed 02/2021 by endo-/colonoscopy for weight loss and anemia. Treatment delayed due to his stroke on 04/18/21. S/p 8 cycles neoadjuvant Keytruda 05/11/21 - 09/28/21. He tolerated well with mild fatigue.  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 -He began FOLFOX on 11/24/21. He has tolerated well overall with some taste changes and mild fatigue -restaging CT scan 02/20/2022 showed PR  -will continue chemo

## 2022-04-19 NOTE — Assessment & Plan Note (Signed)
-  he has a personal history of stage pT3N0 colon cancer in 2000 and prostate cancer dx in 2015, s/p 8 weeks of IMRT with Dr. Murray. -genetic testing and counseling on 04/07/21 confirmed a PMS2 mutation, as well as VUS in ATM, BLM, and SDHA. 

## 2022-04-19 NOTE — Progress Notes (Unsigned)
Aaron Moss   [phone] [phone]   Clinic Follow up Note   Patient Care Team: Biagio Borg, MD as PCP - General Stanford Breed, Denice Bors, MD as PCP - Cardiology (Cardiology) [name], MD as Consulting Physician (Gastroenterology) [name], MD as Consulting Physician (Orthopedic Surgery) [name], MD as Consulting Physician (Ophthalmology) [name], MD as Consulting Physician (Oncology)  Date of Service:  04/20/2022  CHIEF COMPLAINT: f/u of duodenal cancer    CURRENT THERAPY: FOLFOX, q14d, starting 11/24/21    ASSESSMENT:  Aaron Moss is a 77 y.o. 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 Treatment delayed due to his stroke on 04/18/21. S/p 8 cycles neoadjuvant Keytruda 05/11/21 - 09/28/21. He tolerated well with mild fatigue.  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 -He began FOLFOX on 11/24/21. He has tolerated well overall with some taste changes and mild fatigue -restaging CT scan 02/20/2022 showed PR  -will continue chemo   PMS2-related Lynch syndrome (HNPCC4) -he has a personal history of stage pT3N0 colon cancer in 2000 and prostate cancer dx in 2015, s/p 8 weeks of IMRT with Dr. Valere Dross. -genetic testing and counseling on 04/07/21 confirmed a PMS2 mutation, as well as VUS in ATM, BLM, and SDHA.     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 Simple appearing right renal cyst. 2. Otherwise negative abdominal ultrasound   03/18/2021 Procedure   Upper Endoscopy/Colonoscopy, Dr. Loletha Carrow  Upper Endoscopy Impression: - Normal esophagus. - A  single submucosal papule (nodule) found in the stomach. - Duodenal mass. Biopsied. Concerning for malignancy.  Colonoscopy Impression: - Diverticulosis in the entire examined colon. - The examined portion of the ileum was normal. - Patent end-to-end colo-colonic anastomosis, characterized by healthy appearing mucosa. - Internal hemorrhoids. - No specimens collected.   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 Circumferential wall thickening of the first portion of the duodenum, in keeping with known primary malignancy. 2. Enlarged, hypodense lymph nodes anterior to the pancreatic head and in the portacaval station, concerning for nodal metastatic disease. 3. No other evidence of metastatic disease in the chest, abdomen, or pelvis. 4. Incidental note of a fluid attenuation lesion within the proximal pancreatic body measuring 1.9 x 1.4 cm, with prominence of the pancreatic duct distally, up to 0.6 cm. This lesion was present on remote prior examination dated 05/04/2009, and has slightly increased in size over a long period of time. This is consistent with a small IPMN and benign given indolent behavior over greater than 10 years. 5. Background of very fine centrilobular pulmonary nodules, most concentrated in the lung apices, most commonly seen in smoking-related respiratory bronchiolitis. 6. Coronary artery disease.   Aortic Atherosclerosis (  ICD10-I70.0).    Genetic Testing   [facility] CancerNext-Expanded identified a single  pathogenic variant in the PMS2 gene. 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 Of note, a variant of uncertain significance was detected in the ATM (p.A869G), BLM (c.800-3T>G), and SDHA (p.V632F) genes. Report date is 04/27/2021.  The CancerNext-Expanded gene panel offered by [facility] and includes sequencing, rearrangement, and RNA analysis for the following 77 genes: AIP, ALK, APC, ATM, AXIN2, BAP1, BARD1, BLM, BMPR1A, BRCA1, BRCA2, BRIP1, CDC73, CDH1, CDK4, CDKN1B, CDKN2A, CHEK2, CTNNA1, DICER1, FANCC, FH, FLCN, GALNT12, KIF1B, LZTR1, MAX, MEN1, MET, MLH1, MSH2, MSH3, MSH6, MUTYH, NBN, NF1, NF2, NTHL1, PALB2, PHOX2B, PMS2, POT1, PRKAR1A, PTCH1, PTEN, RAD51C, RAD51D, RB1, RECQL, RET, SDHA, SDHAF2, SDHB, SDHC, SDHD, SMAD4, SMARCA4, SMARCB1, SMARCE1, STK11, SUFU, TMEM127, TP53, TSC1, TSC2, VHL and XRCC2 (sequencing and deletion/duplication); EGFR, EGLN1, HOXB13, KIT, MITF, PDGFRA, POLD1, and POLE (sequencing only); EPCAM and GREM1 (deletion/duplication only).    05/11/2021 - 09/28/2021 Chemotherapy   Patient is on Treatment Plan : COLORECTAL Pembrolizumab (200) q21d     11/24/2021 -  Chemotherapy   Patient is on Treatment Plan : COLORECTAL FOLFOX q14d x 4 months      Imaging     02/20/2022 Imaging    IMPRESSION: 1. Distal stomach/duodenal mass is decreased in size when compared with prior exam. 2. Stable upper abdominal adenopathy. 3. 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 4. Aortic Atherosclerosis (ICD10-I70.0).      INTERVAL HISTORY:  Aaron Moss is here for a follow up of  duodenal cancer  He was last seen by PA-C Cassie on 04/06/2022 He presents to the clinic accompanied by daughter. Pt state he still have some mild fatigue. Pt reports he walks around in the yard. Pt state he does everything for himself and appetite is good. Pt stated he has numbness and  tingling in hands and its about the same. Pt has no further questions.   All other systems were reviewed with the patient and are negative.  MEDICAL HISTORY:  Past Medical History:  Diagnosis Date   Anemia 10/31/2021   Dx IDA   Carotid stenosis, left    Colon cancer ([place]) 2000   Coronary artery disease    per Dr. Jacalyn Lefevre note from 04/13/21   Diverticulosis of colon    Duodenal cancer ([place]) 01/2021   GERD (gastroesophageal reflux disease)    Glaucoma    History of chemotherapy 2000   colon cancer   History of radiation therapy 08/06/13- 10/03/13   prostate 7800 cGy 40 sessions, seminal vesicles 5600 cGy 40 sessions   Hyperlipidemia    Hypertension    Left knee DJD    Prostate cancer ([place]) 05/23/2013   gleason 3+4=7, volume 11.5 ml   Stroke ([place]) 04/18/2021   TIA (transient ischemic attack)     SURGICAL HISTORY: Past Surgical History:  Procedure Laterality Date   CHOLECYSTECTOMY N/A 10/19/2021   Procedure: CHOLECYSTECTOMY;  Surgeon: [name], MD;  Location: [facility];  Service: General;  Laterality: N/A;   COLON SURGERY  2000   colon cancer   COLONOSCOPY     ENDARTERECTOMY Left 04/25/2021   Procedure: LEFT ENDARTERECTOMY CAROTID;  Surgeon: Marty Heck, MD;  Location: [facility];  Service: Vascular;  Laterality: Left;   EUS     pancreatic cyst   KNEE ARTHROSCOPY  2007   LT- [facility]   LAPAROSCOPY  N/A 10/19/2021   Procedure: LAPAROSCOPY DIAGNOSTIC;  Surgeon: [name], MD;  Location: [facility];  Service: General;  Laterality: N/A;  GENERAL AND TAP BLOCK   PATCH ANGIOPLASTY Left 04/25/2021   Procedure: PATCH ANGIOPLASTY XENOSURE 1CM X 6CM;  Surgeon: Marty Heck, MD;  Location: [facility];  Service: Vascular;  Laterality: Left;   PORTACATH PLACEMENT Left 11/17/2021   Procedure: PORT PLACEMENT;  Surgeon: [name], MD;  Location: [facility];  Service: General;  Laterality: Left;   PROSTATE BIOPSY  05/23/13   gleason 7, volume 11.5 ml   WHIPPLE PROCEDURE N/A 10/19/2021    Procedure: ATTEMPTED WHIPPLE PROCEDURE  WITH GASTRODUODENAL BYPASS;  Surgeon: [name], MD;  Location: [facility];  Service: General;  Laterality: N/A;  GENERAL AND TAP BLOCK    I have reviewed the social history and family history with the patient and they are unchanged from previous note.  ALLERGIES:  is allergic to lyrica [pregabalin].  MEDICATIONS:  Current Outpatient Medications  Medication Sig Dispense Refill   amLODipine (NORVASC) 10 MG tablet Take 1 tablet by mouth once daily 90 tablet 3   aspirin EC 81 MG EC tablet Take 1 tablet (81 mg total) by mouth daily. Swallow whole. 30 tablet 11   atorvastatin (LIPITOR) 40 MG tablet Take 1 tablet by mouth once daily (Patient not taking: Reported on 04/06/2022) 90 tablet 2   benazepril (LOTENSIN) 40 MG tablet Take 1 tablet by mouth once daily 90 tablet 3   carvedilol (COREG) 12.5 MG tablet Take 12.5 mg by mouth 2 (two) times daily.     iron polysaccharides (FERREX 150) 150 MG capsule Take 1 capsule by mouth twice daily 180 capsule 1   KLOR-CON M20 20 MEQ tablet Take 20 mEq by mouth 3 (three) times daily.     lidocaine-prilocaine (EMLA) cream Apply to affected area once 30 g 3   pantoprazole (PROTONIX) 40 MG tablet Take 1 tablet by mouth once daily 90 tablet 0   potassium chloride 20 MEQ TBCR Take 20 mEq by mouth 3 (three) times daily. 90 tablet 0   prochlorperazine (COMPAZINE) 10 MG tablet Take 1 tablet (10 mg total) by mouth every 6 (six) hours as needed for nausea or vomiting (Use for nausea and / or vomiting unresolved with ondansetron (Zofran).). 30 tablet 0   Travoprost, BAK Free, (TRAVATAN) 0.004 % SOLN ophthalmic solution Place 1 drop into both eyes at bedtime.     VITAMIN D PO Take 2 tablets by mouth daily. Gummies     No current facility-administered medications for this visit.   Facility-Administered Medications Ordered in Other Visits  Medication Dose Route Frequency Provider Last Rate Last Admin   fluorouracil (ADRUCIL) 5,000 mg  in sodium chloride 0.9 % 150 mL chemo infusion  2,375 mg/m2 (Treatment Plan Recorded) Intravenous 1 day or 1 dose [name], MD   Infusion Verify at 04/20/22 1545   sodium chloride flush (NS) 0.9 % injection 10 mL  10 mL Intracatheter PRN [name], MD   10 mL at 04/20/22 1535    PHYSICAL EXAMINATION: ECOG PERFORMANCE STATUS: 1 - Symptomatic but completely ambulatory  Vitals:   04/20/22 1127  BP: (!) 142/60  Pulse: 71  Resp: 17  Temp: 98 F (36.7 C)  SpO2: 100%   Wt Readings from Last 3 Encounters:  04/20/22 204 lb 6.4 oz (92.7 kg)  04/06/22 204 lb 12.8 oz (92.9 kg)  03/23/22 204 lb 9.6 oz (92.8 kg)     GENERAL:alert, no  distress and comfortable SKIN: skin color normal, no rashes or significant lesions EYES: normal, Conjunctiva are pink and non-injected, sclera clear  NEURO: alert & oriented x 3 with fluent speech  LABORATORY DATA:  I have reviewed the data as listed    Latest Ref Rng & Units 04/20/2022   11:04 AM 04/06/2022   10:56 AM 03/23/2022   10:11 AM  CBC  WBC 4.0 - 10.5 K/uL 4.7  5.3  5.6   Hemoglobin 13.0 - 17.0 g/dL 11.2  11.2  12.2   Hematocrit 39.0 - 52.0 % 34.2  34.5  37.7   Platelets 150 - 400 K/uL 120  122  159         Latest Ref Rng & Units 04/20/2022   11:04 AM 04/06/2022   10:56 AM 03/23/2022   10:11 AM  CMP  Glucose 70 - 99 mg/dL 83  86  101   BUN 8 - 23 mg/dL '19  16  16   '$ Creatinine 0.61 - 1.24 mg/dL 0.87  0.93  0.93   Sodium 135 - 145 mmol/L 139  141  141   Potassium 3.5 - 5.1 mmol/L 3.7  3.7  3.9   Chloride 98 - 111 mmol/L 108  109  109   CO2 22 - 32 mmol/L '27  28  28   '$ Calcium 8.9 - 10.3 mg/dL 8.7  8.7  9.0   Total Protein 6.5 - 8.1 g/dL 5.9  5.5  5.6   Total Bilirubin 0.3 - 1.2 mg/dL 1.2  1.0  0.7   Alkaline Phos 38 - 126 U/L 97  90  110   AST 15 - 41 U/L 25  24  37   ALT 0 - 44 U/L 23  23  52       RADIOGRAPHIC STUDIES: I have personally reviewed the radiological images as listed and agreed with the findings in the report. No results  found.    Orders Placed This Encounter  Procedures   CT CHEST ABDOMEN PELVIS W CONTRAST    Standing Status:   Future    Standing Expiration Date:   04/21/2023    Order Specific Question:   Preferred imaging location?    Answer:   [facility]    Order Specific Question:   Is Oral Contrast requested for this exam?    Answer:   Yes, Per Radiology protocol   CBC with Differential ([facility] Only)    Standing Status:   Future    Standing Expiration Date:   05/05/2023   CMP ([facility] only)    Standing Status:   Future    Standing Expiration Date:   05/05/2023   All questions were answered. The patient knows to call the clinic with any problems, questions or concerns. No barriers to learning was detected. The total time spent in the appointment was 30 minutes.     [name], MD 04/20/2022   Felicity Coyer, CMA, am acting as scribe for [name], MD.   I have reviewed the above documentation for accuracy and completeness, and I agree with the above.

## 2022-04-20 ENCOUNTER — Inpatient Hospital Stay: Payer: Medicare Other | Attending: Nurse Practitioner

## 2022-04-20 ENCOUNTER — Encounter: Payer: Self-pay | Admitting: Hematology

## 2022-04-20 ENCOUNTER — Inpatient Hospital Stay (HOSPITAL_BASED_OUTPATIENT_CLINIC_OR_DEPARTMENT_OTHER): Payer: Medicare Other | Admitting: Hematology

## 2022-04-20 ENCOUNTER — Inpatient Hospital Stay: Payer: Medicare Other

## 2022-04-20 VITALS — BP 142/60 | HR 71 | Temp 98.0°F | Resp 17 | Ht 66.0 in | Wt 204.4 lb

## 2022-04-20 DIAGNOSIS — Z95828 Presence of other vascular implants and grafts: Secondary | ICD-10-CM

## 2022-04-20 DIAGNOSIS — Z5111 Encounter for antineoplastic chemotherapy: Secondary | ICD-10-CM | POA: Insufficient documentation

## 2022-04-20 DIAGNOSIS — C170 Malignant neoplasm of duodenum: Secondary | ICD-10-CM

## 2022-04-20 DIAGNOSIS — Z9884 Bariatric surgery status: Secondary | ICD-10-CM | POA: Insufficient documentation

## 2022-04-20 DIAGNOSIS — D508 Other iron deficiency anemias: Secondary | ICD-10-CM | POA: Insufficient documentation

## 2022-04-20 DIAGNOSIS — Z8546 Personal history of malignant neoplasm of prostate: Secondary | ICD-10-CM | POA: Diagnosis not present

## 2022-04-20 DIAGNOSIS — Z79899 Other long term (current) drug therapy: Secondary | ICD-10-CM | POA: Diagnosis not present

## 2022-04-20 DIAGNOSIS — Z1509 Genetic susceptibility to other malignant neoplasm: Secondary | ICD-10-CM

## 2022-04-20 DIAGNOSIS — C61 Malignant neoplasm of prostate: Secondary | ICD-10-CM

## 2022-04-20 DIAGNOSIS — I10 Essential (primary) hypertension: Secondary | ICD-10-CM | POA: Diagnosis not present

## 2022-04-20 DIAGNOSIS — D500 Iron deficiency anemia secondary to blood loss (chronic): Secondary | ICD-10-CM

## 2022-04-20 DIAGNOSIS — Z90411 Acquired partial absence of pancreas: Secondary | ICD-10-CM | POA: Insufficient documentation

## 2022-04-20 DIAGNOSIS — Z7982 Long term (current) use of aspirin: Secondary | ICD-10-CM | POA: Diagnosis not present

## 2022-04-20 DIAGNOSIS — Z923 Personal history of irradiation: Secondary | ICD-10-CM | POA: Insufficient documentation

## 2022-04-20 LAB — CMP (CANCER CENTER ONLY)
ALT: 23 U/L (ref 0–44)
AST: 25 U/L (ref 15–41)
Albumin: 3 g/dL — ABNORMAL LOW (ref 3.5–5.0)
Alkaline Phosphatase: 97 U/L (ref 38–126)
Anion gap: 4 — ABNORMAL LOW (ref 5–15)
BUN: 19 mg/dL (ref 8–23)
CO2: 27 mmol/L (ref 22–32)
Calcium: 8.7 mg/dL — ABNORMAL LOW (ref 8.9–10.3)
Chloride: 108 mmol/L (ref 98–111)
Creatinine: 0.87 mg/dL (ref 0.61–1.24)
GFR, Estimated: >60 mL/min (ref >60)
Glucose, Bld: 83 mg/dL (ref 70–99)
Potassium: 3.7 mmol/L (ref 3.5–5.1)
Sodium: 139 mmol/L (ref 135–145)
Total Bilirubin: 1.2 mg/dL (ref 0.3–1.2)
Total Protein: 5.9 g/dL — ABNORMAL LOW (ref 6.5–8.1)

## 2022-04-20 LAB — CBC WITH DIFFERENTIAL (CANCER CENTER ONLY)
Abs Immature Granulocytes: 0.01 10*3/uL (ref 0.00–0.07)
Basophils Absolute: 0 10*3/uL (ref 0.0–0.1)
Basophils Relative: 0 %
Eosinophils Absolute: 0.2 10*3/uL (ref 0.0–0.5)
Eosinophils Relative: 3 %
HCT: 34.2 % — ABNORMAL LOW (ref 39.0–52.0)
Hemoglobin: 11.2 g/dL — ABNORMAL LOW (ref 13.0–17.0)
Immature Granulocytes: 0 %
Lymphocytes Relative: 24 %
Lymphs Abs: 1.1 10*3/uL (ref 0.7–4.0)
MCH: 27.1 pg (ref 26.0–34.0)
MCHC: 32.7 g/dL (ref 30.0–36.0)
MCV: 82.6 fL (ref 80.0–100.0)
Monocytes Absolute: 1 10*3/uL (ref 0.1–1.0)
Monocytes Relative: 21 %
Neutro Abs: 2.4 10*3/uL (ref 1.7–7.7)
Neutrophils Relative %: 52 %
Platelet Count: 120 10*3/uL — ABNORMAL LOW (ref 150–400)
RBC: 4.14 MIL/uL — ABNORMAL LOW (ref 4.22–5.81)
RDW: 18.2 % — ABNORMAL HIGH (ref 11.5–15.5)
WBC Count: 4.7 10*3/uL (ref 4.0–10.5)
nRBC: 0 % (ref 0.0–0.2)

## 2022-04-20 LAB — CEA (IN HOUSE-CHCC): CEA (CHCC-In House): 8.01 ng/mL — ABNORMAL HIGH (ref 0.00–5.00)

## 2022-04-20 MED ORDER — SODIUM CHLORIDE 0.9% FLUSH
10.0000 mL | Freq: Once | INTRAVENOUS | Status: AC
  Administered 2022-04-20: 10 mL

## 2022-04-20 MED ORDER — SODIUM CHLORIDE 0.9 % IV SOLN
10.0000 mg | Freq: Once | INTRAVENOUS | Status: AC
  Administered 2022-04-20: 10 mg via INTRAVENOUS
  Filled 2022-04-20: qty 10

## 2022-04-20 MED ORDER — OXALIPLATIN CHEMO INJECTION 100 MG/20ML
60.0000 mg/m2 | Freq: Once | INTRAVENOUS | Status: AC
  Administered 2022-04-20: 125 mg via INTRAVENOUS
  Filled 2022-04-20: qty 24.33

## 2022-04-20 MED ORDER — SODIUM CHLORIDE 0.9% FLUSH
10.0000 mL | INTRAVENOUS | Status: DC | PRN
  Administered 2022-04-20: 10 mL

## 2022-04-20 MED ORDER — DEXTROSE 5 % IV SOLN: Freq: Once | INTRAVENOUS | Status: AC

## 2022-04-20 MED ORDER — PALONOSETRON HCL INJECTION 0.25 MG/5ML
0.2500 mg | Freq: Once | INTRAVENOUS | Status: AC
  Administered 2022-04-20: 0.25 mg via INTRAVENOUS
  Filled 2022-04-20: qty 5

## 2022-04-20 MED ORDER — LEUCOVORIN CALCIUM INJECTION 350 MG
400.0000 mg/m2 | Freq: Once | INTRAVENOUS | Status: AC
  Administered 2022-04-20: 844 mg via INTRAVENOUS
  Filled 2022-04-20: qty 25

## 2022-04-20 MED ORDER — SODIUM CHLORIDE 0.9 % IV SOLN
2375.0000 mg/m2 | INTRAVENOUS | Status: DC
  Administered 2022-04-20: 5000 mg via INTRAVENOUS
  Filled 2022-04-20: qty 100

## 2022-04-20 NOTE — Patient Instructions (Signed)
Aaron Moss  Discharge Instructions: Thank you for choosing [facility] to provide your oncology and hematology care.   If you have a lab appointment with the [facility], please go directly to the [facility] and check in at the registration area.   Wear comfortable clothing and clothing appropriate for easy access to any Portacath or PICC line.   We strive to give you quality time with your provider. You may need to reschedule your appointment if you arrive late (15 or more minutes).  Arriving late affects you and other patients whose appointments are after yours.  Also, if you miss three or more appointments without notifying the office, you may be dismissed from the clinic at the provider's discretion.      For prescription refill requests, have your pharmacy contact our office and allow 72 hours for refills to be completed.    Today you received the following chemotherapy and/or immunotherapy agents: Oxaliplatin/Leucovorin/Fluorouracil   To help prevent nausea and vomiting after your treatment, we encourage you to take your nausea medication as directed.  BELOW ARE SYMPTOMS THAT SHOULD BE REPORTED IMMEDIATELY: *FEVER GREATER THAN 100.4 F (38 C) OR HIGHER *CHILLS OR SWEATING *NAUSEA AND VOMITING THAT IS NOT CONTROLLED WITH YOUR NAUSEA MEDICATION *UNUSUAL SHORTNESS OF BREATH *UNUSUAL BRUISING OR BLEEDING *URINARY PROBLEMS (pain or burning when urinating, or frequent urination) *BOWEL PROBLEMS (unusual diarrhea, constipation, pain near the anus) TENDERNESS IN MOUTH AND THROAT WITH OR WITHOUT PRESENCE OF ULCERS (sore throat, sores in mouth, or a toothache) UNUSUAL RASH, SWELLING OR PAIN  UNUSUAL VAGINAL DISCHARGE OR ITCHING   Items with * indicate a potential emergency and should be followed up as soon as possible or go to the Emergency Department if any problems should occur.  Please show the CHEMOTHERAPY ALERT CARD or  IMMUNOTHERAPY ALERT CARD at check-in to the Emergency Department and triage nurse.  Should you have questions after your visit or need to cancel or reschedule your appointment, please contact [facility]  Dept: [phone]  and follow the prompts.  Office hours are 8:00 a.m. to 4:30 p.m. Monday - Friday. Please note that voicemails left after 4:00 p.m. may not be returned until the following business day.  We are closed weekends and major holidays. You have access to a nurse at all times for urgent questions. Please call the main number to the clinic Dept: [phone] and follow the prompts.   For any non-urgent questions, you may also contact your provider using MyChart. We now offer e-Visits for anyone 70 and older to request care online for non-urgent symptoms. For details visit [email or web address].   Also download the MyChart app! Go to the app store, search "MyChart", open the app, select [facility], and log in with your MyChart username and password.  The chemotherapy medication bag should finish at 46 hours, 96 hours, or 7 days. For example, if your pump is scheduled for 46 hours and it was put on at 4:00 p.m., it should finish at 2:00 p.m. the day it is scheduled to come off regardless of your appointment time.     Estimated time to finish at 1:45 PM on Saturday 03/22/2022.   If the display on your pump reads "Low Volume" and it is beeping, take the batteries out of the pump and come to the cancer center for it to be taken off.   If the pump alarms go off prior to  the pump reading "Low Volume" then call [phone] and someone can assist you.  If the plunger comes out and the chemotherapy medication is leaking out, please use your home chemo spill kit to clean up the spill. Do NOT use paper towels or other household products.  If you have problems or questions regarding your pump, please call either [phone] (24 hours a day) or the cancer  center Monday-Friday 8:00 a.m.- 4:30 p.m. at the clinic number and we will assist you. If you are unable to get assistance, then go to the nearest Emergency Department and ask the staff to contact the IV team for assistance.

## 2022-04-21 ENCOUNTER — Telehealth: Payer: Self-pay | Admitting: Hematology

## 2022-04-21 NOTE — Telephone Encounter (Signed)
Left patient a vm regarding all upcoming appointments

## 2022-04-22 ENCOUNTER — Inpatient Hospital Stay: Payer: Medicare Other

## 2022-04-22 VITALS — BP 123/52 | HR 79 | Temp 98.1°F | Resp 17

## 2022-04-22 DIAGNOSIS — C170 Malignant neoplasm of duodenum: Secondary | ICD-10-CM

## 2022-04-22 DIAGNOSIS — Z5111 Encounter for antineoplastic chemotherapy: Secondary | ICD-10-CM | POA: Diagnosis not present

## 2022-04-22 MED ORDER — SODIUM CHLORIDE 0.9% FLUSH
10.0000 mL | INTRAVENOUS | Status: DC | PRN
  Administered 2022-04-22: 10 mL

## 2022-04-22 MED ORDER — HEPARIN SOD (PORK) LOCK FLUSH 100 UNIT/ML IV SOLN
500.0000 [IU] | Freq: Once | INTRAVENOUS | Status: AC | PRN
  Administered 2022-04-22: 500 [IU]

## 2022-04-23 ENCOUNTER — Other Ambulatory Visit: Payer: Self-pay

## 2022-04-30 NOTE — Progress Notes (Unsigned)
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 Simple appearing right renal cyst. 2. Otherwise negative abdominal ultrasound   03/18/2021 Procedure   Upper Endoscopy/Colonoscopy, Dr. Loletha Carrow  Upper Endoscopy Impression: - Normal esophagus. - A single submucosal papule (nodule) found in the stomach. - Duodenal mass. Biopsied. Concerning for malignancy.  Colonoscopy Impression: - Diverticulosis in the entire examined colon. - The examined portion of the ileum was normal. - Patent end-to-end colo-colonic anastomosis, characterized by healthy appearing mucosa. - Internal hemorrhoids. - No specimens collected.   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 Circumferential wall thickening of the first portion of the duodenum, in keeping with known primary malignancy. 2. Enlarged, hypodense lymph nodes anterior to the pancreatic head and in the portacaval station, concerning for nodal metastatic disease. 3. No other evidence of metastatic disease in the chest,  abdomen, or pelvis. 4. Incidental note of a fluid attenuation lesion within the proximal pancreatic body measuring 1.9 x 1.4 cm, with prominence of the pancreatic duct distally, up to 0.6 cm. This lesion was present on remote prior examination dated 05/04/2009, and has slightly increased in size over a long period of time. This is consistent with a small IPMN and benign given indolent behavior over greater than 10 years. 5. Background of very fine centrilobular pulmonary nodules, most concentrated in the lung apices, most commonly seen in smoking-related respiratory bronchiolitis. 6. Coronary artery disease.   Aortic Atherosclerosis (ICD10-I70.0).    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 Of note, a variant of uncertain significance was detected in the ATM (p.A869G), BLM (c.800-3T>G), and SDHA (p.V632F) genes. Report date is 04/27/2021.  The CancerNext-Expanded gene panel offered by [facility] and includes sequencing, rearrangement, and RNA analysis for the following 77 genes: AIP, ALK, APC, ATM, AXIN2, BAP1, BARD1, BLM, BMPR1A, BRCA1, BRCA2, BRIP1, CDC73, CDH1, CDK4, CDKN1B, CDKN2A, CHEK2, CTNNA1, DICER1, FANCC, FH, FLCN, GALNT12, KIF1B, LZTR1, MAX, MEN1, MET, MLH1, MSH2, MSH3, MSH6, MUTYH, NBN, NF1, NF2, NTHL1, PALB2, PHOX2B, PMS2, POT1, PRKAR1A, PTCH1, PTEN, RAD51C, RAD51D, RB1, RECQL, RET, SDHA, SDHAF2, SDHB, SDHC, SDHD, SMAD4, SMARCA4, SMARCB1, SMARCE1, STK11, SUFU, TMEM127, TP53, TSC1, TSC2, VHL and XRCC2 (sequencing and deletion/duplication); EGFR, EGLN1, HOXB13, KIT, MITF, PDGFRA, POLD1, and POLE (sequencing only); EPCAM and GREM1 (deletion/duplication only).    05/11/2021 - 09/28/2021 Chemotherapy   Patient is on Treatment Plan : COLORECTAL Pembrolizumab (200) q21d     11/24/2021 -  Chemotherapy   Patient is on Treatment Plan : COLORECTAL FOLFOX q14d x 4 months      Imaging  02/20/2022 Imaging    IMPRESSION: 1. Distal stomach/duodenal mass is decreased in size when compared with prior exam. 2. Stable upper abdominal adenopathy. 3. 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 4. Aortic Atherosclerosis (ICD10-I70.0).      CURRENT THERAPY: FOLFOX q14 days, starting 11/24/21  INTERVAL HISTORY Mr. Heeb returns for follow up and treatment as scheduled. Last seen by Dr. Burr Medico 04/20/22 and completed cycle 10 FOLFOX.   ROS   Past Medical History:  Diagnosis Date   Anemia 10/31/2021   Dx IDA   Carotid stenosis, left    Colon cancer ([place]) 2000   Coronary artery disease    per Dr. 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, volume 11.5 ml   Stroke ([place]) 04/18/2021   TIA (transient ischemic attack)      Past Surgical History:  Procedure Laterality Date   CHOLECYSTECTOMY N/A 10/19/2021   Procedure: CHOLECYSTECTOMY;  Surgeon: [name], MD;  Location: [place];  Service: General;  Laterality: N/A;   COLON SURGERY  2000   colon cancer   COLONOSCOPY     ENDARTERECTOMY Left 04/25/2021   Procedure: LEFT ENDARTERECTOMY CAROTID;  Surgeon: Marty Heck, MD;  Location: [place];  Service: Vascular;  Laterality: Left;   EUS     pancreatic cyst   KNEE ARTHROSCOPY  2007   LT- [facility]   LAPAROSCOPY N/A 10/19/2021   Procedure: LAPAROSCOPY DIAGNOSTIC;  Surgeon: [name], MD;  Location: [place];  Service: General;  Laterality: N/A;  GENERAL AND TAP BLOCK   PATCH ANGIOPLASTY Left  04/25/2021   Procedure: PATCH ANGIOPLASTY XENOSURE 1CM X 6CM;  Surgeon: Marty Heck, MD;  Location: [place];  Service: Vascular;  Laterality: Left;   PORTACATH PLACEMENT Left 11/17/2021   Procedure: PORT PLACEMENT;  Surgeon: [name], MD;  Location: [place];  Service: General;  Laterality: Left;   PROSTATE BIOPSY  05/23/13   gleason 7, volume 11.5 ml   WHIPPLE PROCEDURE N/A 10/19/2021   Procedure: ATTEMPTED WHIPPLE PROCEDURE  WITH GASTRODUODENAL BYPASS;  Surgeon: [name], MD;  Location: [place];  Service: General;  Laterality: N/A;  GENERAL AND TAP BLOCK     Outpatient Encounter Medications as of 05/03/2022  Medication Sig Note   amLODipine (NORVASC) 10 MG tablet Take 1 tablet by mouth once daily    aspirin EC 81 MG EC tablet Take 1 tablet (81 mg total) by mouth daily. Swallow whole.    atorvastatin (LIPITOR) 40 MG tablet Take 1 tablet by mouth once daily (Patient not taking: Reported on 04/06/2022)    benazepril (LOTENSIN) 40 MG tablet Take 1 tablet by mouth once daily    carvedilol (COREG) 12.5 MG tablet Take 12.5 mg by mouth 2 (two) times daily.    iron polysaccharides (FERREX 150) 150 MG capsule Take 1 capsule by mouth twice daily    KLOR-CON M20 20 MEQ tablet Take 20 mEq by mouth 3 (three) times daily.    lidocaine-prilocaine (  EMLA) cream Apply to affected area once 11/16/2021: Has not started   pantoprazole (PROTONIX) 40 MG tablet Take 1 tablet by mouth once daily    potassium chloride 20 MEQ TBCR Take 20 mEq by mouth 3 (three) times daily.    prochlorperazine (COMPAZINE) 10 MG tablet Take 1 tablet (10 mg total) by mouth every 6 (six) hours as needed for nausea or vomiting (Use for nausea and / or vomiting unresolved with ondansetron (Zofran).).    Travoprost, BAK Free, (TRAVATAN) 0.004 % SOLN ophthalmic solution Place 1 drop into both eyes at bedtime.    VITAMIN D PO Take 2 tablets by mouth daily. Gummies    No facility-administered encounter medications on file as of 05/03/2022.      There were no vitals filed for this visit. There is no height or weight on file to calculate BMI.   PHYSICAL EXAM GENERAL:alert, no distress and comfortable SKIN: no rash  EYES: sclera clear NECK: without mass LYMPH:  no palpable cervical or supraclavicular lymphadenopathy  LUNGS: clear with normal breathing effort HEART: regular rate & rhythm, no lower extremity edema ABDOMEN: abdomen soft, non-tender and normal bowel sounds NEURO: alert & oriented x 3 with fluent speech, no focal motor/sensory deficits Breast exam:  PAC without erythema    CBC    Component Value Date/Time   WBC 4.7 04/20/2022 1104   WBC 8.5 10/31/2021 1211   RBC 4.14 (L) 04/20/2022 1104   HGB 11.2 (L) 04/20/2022 1104   HCT 34.2 (L) 04/20/2022 1104   PLT 120 (L) 04/20/2022 1104   MCV 82.6 04/20/2022 1104   MCH 27.1 04/20/2022 1104   MCHC 32.7 04/20/2022 1104   RDW 18.2 (H) 04/20/2022 1104   LYMPHSABS 1.1 04/20/2022 1104   MONOABS 1.0 04/20/2022 1104   EOSABS 0.2 04/20/2022 1104   BASOSABS 0.0 04/20/2022 1104     CMP     Component Value Date/Time   NA 139 04/20/2022 1104   K 3.7 04/20/2022 1104   CL 108 04/20/2022 1104   CO2 27 04/20/2022 1104   GLUCOSE 83 04/20/2022 1104   BUN 19 04/20/2022 1104   CREATININE 0.87 04/20/2022 1104   CALCIUM 8.7 (L) 04/20/2022 1104   PROT 5.9 (L) 04/20/2022 1104   ALBUMIN 3.0 (L) 04/20/2022 1104   AST 25 04/20/2022 1104   ALT 23 04/20/2022 1104   ALKPHOS 97 04/20/2022 1104   BILITOT 1.2 04/20/2022 1104   GFRNONAA >60 04/20/2022 1104   GFRAA >60 02/24/2019 0207     ASSESSMENT & PLAN:  PLAN:  No orders of the defined types were placed in this encounter.     All questions were answered. The patient knows to call the clinic with any problems, questions or concerns. No barriers to learning were detected. I spent *** counseling the patient face to face. The total time spent in the appointment was *** and more than 50% was on counseling, review of test  results, and coordination of care.   Cira Rue, NP-C @DATE$ @

## 2022-05-02 ENCOUNTER — Other Ambulatory Visit: Payer: Self-pay | Admitting: Internal Medicine

## 2022-05-02 MED FILL — Dexamethasone Sodium Phosphate Inj 100 MG/10ML: INTRAMUSCULAR | Qty: 1 | Status: AC

## 2022-05-03 ENCOUNTER — Inpatient Hospital Stay: Payer: Medicare Other

## 2022-05-03 ENCOUNTER — Encounter: Payer: Self-pay | Admitting: Nurse Practitioner

## 2022-05-03 ENCOUNTER — Other Ambulatory Visit: Payer: Self-pay

## 2022-05-03 ENCOUNTER — Inpatient Hospital Stay (HOSPITAL_BASED_OUTPATIENT_CLINIC_OR_DEPARTMENT_OTHER): Payer: Medicare Other | Admitting: Nurse Practitioner

## 2022-05-03 DIAGNOSIS — C170 Malignant neoplasm of duodenum: Secondary | ICD-10-CM | POA: Diagnosis not present

## 2022-05-03 DIAGNOSIS — Z5111 Encounter for antineoplastic chemotherapy: Secondary | ICD-10-CM | POA: Diagnosis not present

## 2022-05-03 DIAGNOSIS — Z95828 Presence of other vascular implants and grafts: Secondary | ICD-10-CM

## 2022-05-03 DIAGNOSIS — D500 Iron deficiency anemia secondary to blood loss (chronic): Secondary | ICD-10-CM

## 2022-05-03 LAB — CMP (CANCER CENTER ONLY)
ALT: 22 U/L (ref 0–44)
AST: 24 U/L (ref 15–41)
Albumin: 3.2 g/dL — ABNORMAL LOW (ref 3.5–5.0)
Alkaline Phosphatase: 102 U/L (ref 38–126)
Anion gap: 4 — ABNORMAL LOW (ref 5–15)
BUN: 15 mg/dL (ref 8–23)
CO2: 28 mmol/L (ref 22–32)
Calcium: 8.8 mg/dL — ABNORMAL LOW (ref 8.9–10.3)
Chloride: 111 mmol/L (ref 98–111)
Creatinine: 0.92 mg/dL (ref 0.61–1.24)
GFR, Estimated: >60 mL/min (ref >60)
Glucose, Bld: 139 mg/dL — ABNORMAL HIGH (ref 70–99)
Potassium: 3.6 mmol/L (ref 3.5–5.1)
Sodium: 143 mmol/L (ref 135–145)
Total Bilirubin: 1.2 mg/dL (ref 0.3–1.2)
Total Protein: 5.9 g/dL — ABNORMAL LOW (ref 6.5–8.1)

## 2022-05-03 LAB — CBC WITH DIFFERENTIAL (CANCER CENTER ONLY)
Abs Immature Granulocytes: 0.01 10*3/uL (ref 0.00–0.07)
Basophils Absolute: 0 10*3/uL (ref 0.0–0.1)
Basophils Relative: 0 %
Eosinophils Absolute: 0.2 10*3/uL (ref 0.0–0.5)
Eosinophils Relative: 3 %
HCT: 35.5 % — ABNORMAL LOW (ref 39.0–52.0)
Hemoglobin: 11.6 g/dL — ABNORMAL LOW (ref 13.0–17.0)
Immature Granulocytes: 0 %
Lymphocytes Relative: 19 %
Lymphs Abs: 1 10*3/uL (ref 0.7–4.0)
MCH: 27.1 pg (ref 26.0–34.0)
MCHC: 32.7 g/dL (ref 30.0–36.0)
MCV: 82.9 fL (ref 80.0–100.0)
Monocytes Absolute: 0.7 10*3/uL (ref 0.1–1.0)
Monocytes Relative: 13 %
Neutro Abs: 3.3 10*3/uL (ref 1.7–7.7)
Neutrophils Relative %: 65 %
Platelet Count: 137 10*3/uL — ABNORMAL LOW (ref 150–400)
RBC: 4.28 MIL/uL (ref 4.22–5.81)
RDW: 18 % — ABNORMAL HIGH (ref 11.5–15.5)
WBC Count: 5.2 10*3/uL (ref 4.0–10.5)
nRBC: 0 % (ref 0.0–0.2)

## 2022-05-03 MED ORDER — SODIUM CHLORIDE 0.9 % IV SOLN
10.0000 mg | Freq: Once | INTRAVENOUS | Status: AC
  Administered 2022-05-03: 10 mg via INTRAVENOUS
  Filled 2022-05-03: qty 10

## 2022-05-03 MED ORDER — SODIUM CHLORIDE 0.9 % IV SOLN
2400.0000 mg/m2 | INTRAVENOUS | Status: DC
  Administered 2022-05-03: 5000 mg via INTRAVENOUS
  Filled 2022-05-03: qty 100

## 2022-05-03 MED ORDER — HEPARIN SOD (PORK) LOCK FLUSH 100 UNIT/ML IV SOLN: 500.0000 [IU] | Freq: Once | INTRAVENOUS | Status: DC | PRN

## 2022-05-03 MED ORDER — SODIUM CHLORIDE 0.9% FLUSH
10.0000 mL | Freq: Once | INTRAVENOUS | Status: AC
  Administered 2022-05-03: 10 mL

## 2022-05-03 MED ORDER — SODIUM CHLORIDE 0.9% FLUSH: 10.0000 mL | INTRAVENOUS | Status: DC | PRN

## 2022-05-03 MED ORDER — PALONOSETRON HCL INJECTION 0.25 MG/5ML
0.2500 mg | Freq: Once | INTRAVENOUS | Status: AC
  Administered 2022-05-03: 0.25 mg via INTRAVENOUS
  Filled 2022-05-03: qty 5

## 2022-05-03 MED ORDER — OXALIPLATIN CHEMO INJECTION 100 MG/20ML
60.0000 mg/m2 | Freq: Once | INTRAVENOUS | Status: AC
  Administered 2022-05-03: 125 mg via INTRAVENOUS
  Filled 2022-05-03: qty 5

## 2022-05-03 MED ORDER — LEUCOVORIN CALCIUM INJECTION 350 MG
400.0000 mg/m2 | Freq: Once | INTRAVENOUS | Status: AC
  Administered 2022-05-03: 844 mg via INTRAVENOUS
  Filled 2022-05-03: qty 42.2

## 2022-05-03 MED ORDER — DEXTROSE 5 % IV SOLN: Freq: Once | INTRAVENOUS | Status: AC

## 2022-05-05 ENCOUNTER — Inpatient Hospital Stay: Payer: Medicare Other

## 2022-05-05 ENCOUNTER — Other Ambulatory Visit: Payer: Self-pay

## 2022-05-05 VITALS — BP 128/61 | HR 57 | Temp 98.5°F | Resp 18

## 2022-05-05 DIAGNOSIS — Z5111 Encounter for antineoplastic chemotherapy: Secondary | ICD-10-CM | POA: Diagnosis not present

## 2022-05-05 DIAGNOSIS — Z95828 Presence of other vascular implants and grafts: Secondary | ICD-10-CM

## 2022-05-05 DIAGNOSIS — C170 Malignant neoplasm of duodenum: Secondary | ICD-10-CM

## 2022-05-05 DIAGNOSIS — D500 Iron deficiency anemia secondary to blood loss (chronic): Secondary | ICD-10-CM

## 2022-05-05 MED ORDER — SODIUM CHLORIDE 0.9% FLUSH
10.0000 mL | Freq: Once | INTRAVENOUS | Status: AC
  Administered 2022-05-05: 10 mL

## 2022-05-05 MED ORDER — HEPARIN SOD (PORK) LOCK FLUSH 100 UNIT/ML IV SOLN
500.0000 [IU] | Freq: Once | INTRAVENOUS | Status: AC
  Administered 2022-05-05: 500 [IU]

## 2022-05-07 ENCOUNTER — Other Ambulatory Visit: Payer: Self-pay | Admitting: Hematology

## 2022-05-07 DIAGNOSIS — C170 Malignant neoplasm of duodenum: Secondary | ICD-10-CM

## 2022-05-09 ENCOUNTER — Other Ambulatory Visit: Payer: Self-pay | Admitting: Internal Medicine

## 2022-05-11 ENCOUNTER — Other Ambulatory Visit: Payer: Self-pay | Admitting: Internal Medicine

## 2022-05-15 ENCOUNTER — Encounter (HOSPITAL_COMMUNITY): Payer: Self-pay

## 2022-05-15 ENCOUNTER — Ambulatory Visit (HOSPITAL_COMMUNITY)
Admission: RE | Admit: 2022-05-15 | Discharge: 2022-05-15 | Disposition: A | Discharge status: Home / Self Care | Payer: Medicare Other | Source: Ambulatory Visit | Attending: Hematology | Admitting: Hematology

## 2022-05-15 DIAGNOSIS — C170 Malignant neoplasm of duodenum: Secondary | ICD-10-CM | POA: Diagnosis present

## 2022-05-15 MED ORDER — IOHEXOL 9 MG/ML PO SOLN
500.0000 mL | ORAL | Status: AC
  Administered 2022-05-15: 1000 mL via ORAL

## 2022-05-15 MED ORDER — IOHEXOL 300 MG/ML  SOLN
100.0000 mL | Freq: Once | INTRAMUSCULAR | Status: AC | PRN
  Administered 2022-05-15: 100 mL via INTRAVENOUS

## 2022-05-15 MED ORDER — HEPARIN SOD (PORK) LOCK FLUSH 100 UNIT/ML IV SOLN: 500.0000 [IU] | Freq: Once | INTRAVENOUS | Status: AC

## 2022-05-15 MED ORDER — IOHEXOL 9 MG/ML PO SOLN
ORAL | Status: AC
  Filled 2022-05-15: qty 1000

## 2022-05-15 MED ORDER — SODIUM CHLORIDE (PF) 0.9 % IJ SOLN
INTRAMUSCULAR | Status: AC
  Filled 2022-05-15: qty 50

## 2022-05-15 MED ORDER — HEPARIN SOD (PORK) LOCK FLUSH 100 UNIT/ML IV SOLN
INTRAVENOUS | Status: AC
  Administered 2022-05-15: 500 [IU] via INTRAVENOUS
  Filled 2022-05-15: qty 5

## 2022-05-16 ENCOUNTER — Ambulatory Visit (HOSPITAL_COMMUNITY)
Admission: RE | Admit: 2022-05-16 | Discharge: 2022-05-16 | Disposition: A | Discharge status: Home / Self Care | Payer: Medicare Other | Source: Ambulatory Visit | Attending: Vascular Surgery | Admitting: Vascular Surgery

## 2022-05-16 ENCOUNTER — Ambulatory Visit: Payer: Medicare Other | Admitting: Vascular Surgery

## 2022-05-16 ENCOUNTER — Other Ambulatory Visit: Payer: Self-pay

## 2022-05-16 ENCOUNTER — Encounter: Payer: Self-pay | Admitting: Vascular Surgery

## 2022-05-16 VITALS — BP 134/75 | HR 70 | Temp 98.1°F | Resp 16 | Ht 66.0 in | Wt 196.0 lb

## 2022-05-16 DIAGNOSIS — I6522 Occlusion and stenosis of left carotid artery: Secondary | ICD-10-CM | POA: Diagnosis present

## 2022-05-16 DIAGNOSIS — I63232 Cerebral infarction due to unspecified occlusion or stenosis of left carotid arteries: Secondary | ICD-10-CM | POA: Diagnosis not present

## 2022-05-16 NOTE — Progress Notes (Signed)
Patient name: Aaron Moss MRN: [id number] DOB: 05/25/1945 Sex: male  REASON FOR VISIT: 9 month follow-up for carotid surveillance   HPI: Aaron Moss is a 77 y.o. male with multiple medical comorbidities that presents for 9 month follow-up for carotid surveilalnce.  He had a symptomatic high-grade stenosis on the left and presented with TIA with right arm weakness.  He underwent left CEA on 04/25/2021 and was discharged home.    He reports no new neurologic symptoms since last evaluation.  He is undergoing chemotherapy for duodenal cancer with FOLFOX.  Take aspirin statin.  No new concerns today.  Past Medical History:  Diagnosis Date   Anemia 10/31/2021   Dx IDA   Carotid stenosis, left    Colon cancer ([place]) 2000   Coronary artery disease    per Dr. 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, volume 11.5 ml   Stroke ([place]) 04/18/2021   TIA (transient ischemic attack)     Past Surgical History:  Procedure Laterality Date   CHOLECYSTECTOMY N/A 10/19/2021   Procedure: CHOLECYSTECTOMY;  Surgeon: [name], MD;  Location: [place];  Service: General;  Laterality: N/A;   COLON SURGERY  2000   colon cancer   COLONOSCOPY     ENDARTERECTOMY Left 04/25/2021   Procedure: LEFT ENDARTERECTOMY CAROTID;  Surgeon: Marty Heck, MD;  Location: [place];  Service: Vascular;  Laterality: Left;   EUS     pancreatic cyst   KNEE ARTHROSCOPY  2007   LT- [facility]   LAPAROSCOPY N/A 10/19/2021   Procedure: LAPAROSCOPY DIAGNOSTIC;  Surgeon: [name], MD;  Location: [place];  Service: General;  Laterality: N/A;  GENERAL AND  TAP BLOCK   PATCH ANGIOPLASTY Left 04/25/2021   Procedure: PATCH ANGIOPLASTY XENOSURE 1CM X 6CM;  Surgeon: Marty Heck, MD;  Location: [place];  Service: Vascular;  Laterality: Left;   PORTACATH PLACEMENT Left 11/17/2021   Procedure: PORT PLACEMENT;  Surgeon: [name], MD;  Location: [place];  Service: General;  Laterality: Left;   PROSTATE BIOPSY  05/23/13   gleason 7, volume 11.5 ml   WHIPPLE PROCEDURE N/A 10/19/2021   Procedure: ATTEMPTED WHIPPLE PROCEDURE  WITH GASTRODUODENAL BYPASS;  Surgeon: [name], MD;  Location: [place];  Service: General;  Laterality: N/A;  GENERAL AND TAP BLOCK    Family History  Problem Relation Age of Onset   Cancer Mother        Liver? bladder?, dx. late 17s   Colon cancer Sister 77   Cancer Brother        Lung   Diabetes Brother    Cancer Maternal Aunt        unknown type   Breast cancer Maternal Grandmother        dx. >50   Cancer Daughter 19       colon   Esophageal cancer Neg Hx    Rectal cancer Neg Hx    Stomach cancer Neg Hx     SOCIAL HISTORY: Social History   Tobacco Use   Smoking status:  Former    Packs/day: 1.00    Years: 22.00    Total pack years: 22.00    Types: Cigarettes    Quit date: 03/20/1988    Years since quitting: 34.1   Smokeless tobacco: Never  Substance Use Topics   Alcohol use: Not Currently    Comment: rare    Allergies  Allergen Reactions   Lyrica [Pregabalin] Other (See Comments)    "Just made me feel bad"    Current Outpatient Medications  Medication Sig Dispense Refill   amLODipine (NORVASC) 10 MG tablet Take 1 tablet by mouth once daily 90 tablet 3   aspirin EC 81 MG EC tablet Take 1 tablet (81 mg total) by mouth daily. Swallow whole. 30 tablet 11   atorvastatin (LIPITOR) 40 MG tablet Take 1 tablet by mouth once daily 90 tablet 2   benazepril (LOTENSIN) 40 MG tablet Take 1 tablet by mouth once daily 90 tablet 3   carvedilol (COREG) 12.5 MG tablet Take 12.5 mg by mouth 2 (two) times daily.      iron polysaccharides (FERREX 150) 150 MG capsule Take 1 capsule by mouth twice daily 180 capsule 1   lidocaine-prilocaine (EMLA) cream Apply to affected area once 30 g 3   pantoprazole (PROTONIX) 40 MG tablet Take 1 tablet by mouth once daily 90 tablet 0   potassium chloride 20 MEQ TBCR Take 20 mEq by mouth 3 (three) times daily. 90 tablet 0   prochlorperazine (COMPAZINE) 10 MG tablet Take 1 tablet (10 mg total) by mouth every 6 (six) hours as needed for nausea or vomiting (Use for nausea and / or vomiting unresolved with ondansetron (Zofran).). 30 tablet 0   Travoprost, BAK Free, (TRAVATAN) 0.004 % SOLN ophthalmic solution Place 1 drop into both eyes at bedtime.     VITAMIN D PO Take 2 tablets by mouth daily. Gummies     No current facility-administered medications for this visit.    REVIEW OF SYSTEMS:  '[X]'$  denotes positive finding, '[ ]'$  denotes negative finding Cardiac  Comments:  Chest pain or chest pressure:    Shortness of breath upon exertion:    Short of breath when lying flat:    Irregular heart rhythm:        Vascular    Pain in calf, thigh, or hip brought on by ambulation:    Pain in feet at night that wakes you up from your sleep:     Blood clot in your veins:    Leg swelling:         Pulmonary    Oxygen at home:    Productive cough:     Wheezing:         Neurologic    Sudden weakness in arms or legs:     Sudden numbness in arms or legs:     Sudden onset of difficulty speaking or slurred speech:    Temporary loss of vision in one eye:     Problems with dizziness:         Gastrointestinal    Blood in stool:     Vomited blood:         Genitourinary    Burning when urinating:     Blood in urine:        Psychiatric    Major depression:         Hematologic    Bleeding problems:    Problems with blood clotting too easily:        Skin  Rashes or ulcers:        Constitutional    Fever or chills:      PHYSICAL EXAM: Vitals:   05/16/22 0848 05/16/22 0851   BP: (!) 148/76 134/75  Pulse: 71 70  Resp: 16   Temp: 98.1 F (36.7 C)   TempSrc: Temporal   SpO2: 98%   Weight: 196 lb (88.9 kg)   Height: '5\' 6"'$  (1.676 m)     GENERAL: The patient is a well-nourished male, in no acute distress. The vital signs are documented above. CARDIAC: There is a regular rate and rhythm.  VASCULAR:  Left neck incision well healed. PULMONARY: No respiratory distress. ABDOMEN: Soft and non-tender. NEUROLOGIC: No focal weakness or paresthesias are detected.  Cranial nerves II through XII grossly intact SKIN: There are no ulcers or rashes noted. PSYCHIATRIC: The patient has a normal affect.  DATA:   Carotid duplex today shows 1 to 39% stenosis bilaterally  Assessment/Plan:  77 year old male status post left carotid enterectomy on 04/25/2021 for symptomatic high-grade calcified stenosis.  He presented with a TIA and had a right upper extremity weakness.  He presents for 59-monthfollow-up today.  He had no neurologic events since last evaluation.  Discussed that his carotid duplex today shows minimal 1 to 39% stenosis bilaterally.  No evidence of significant recurrent disease on the left at the site of previous endarterectomy.  Discussed he remain on aspirin and statin for risk reduction.  I will see him in 1 year with carotid duplex for ongoing surveillance.   CMarty Heck MD [facility] FlatsOffice: [phone]

## 2022-05-17 ENCOUNTER — Other Ambulatory Visit: Payer: Self-pay | Admitting: Internal Medicine

## 2022-05-17 MED FILL — Dexamethasone Sodium Phosphate Inj 100 MG/10ML: INTRAMUSCULAR | Qty: 1 | Status: AC

## 2022-05-17 NOTE — Telephone Encounter (Signed)
Please refill as per office routine med refill policy (all routine meds to be refilled for 3 mo or monthly (per pt preference) up to one year from last visit, then month to month grace period for 3 mo, then further med refills will have to be denied) ? ?

## 2022-05-17 NOTE — Assessment & Plan Note (Signed)
-  diagnosed 02/2021 by endo-/colonoscopy for weight loss and anemia. Treatment delayed due to his stroke on 04/18/21. S/p 8 cycles neoadjuvant Keytruda 05/11/21 - 09/28/21. He tolerated well with mild fatigue.  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 -He began FOLFOX on 11/24/21. He has tolerated well overall with some taste changes and mild fatigue -restaging CT scan 02/20/2022 showed PR -restaging CT from 05/15/2022 showed improved masslike enlargement of the duodenal as well as fold nodularity and thickening along the stomach, however it showed a new liver lesion, indeterminate, will obtain liver MRI for further evaluation  -will continue chemo for now

## 2022-05-17 NOTE — Assessment & Plan Note (Signed)
-  he has a personal history of stage pT3N0 colon cancer in 2000 and prostate cancer dx in 2015, s/p 8 weeks of IMRT with Dr. Murray. -genetic testing and counseling on 04/07/21 confirmed a PMS2 mutation, as well as VUS in ATM, BLM, and SDHA. 

## 2022-05-18 ENCOUNTER — Inpatient Hospital Stay (HOSPITAL_BASED_OUTPATIENT_CLINIC_OR_DEPARTMENT_OTHER): Payer: Medicare Other | Admitting: Hematology

## 2022-05-18 ENCOUNTER — Encounter: Payer: Self-pay | Admitting: Hematology

## 2022-05-18 ENCOUNTER — Inpatient Hospital Stay: Payer: Medicare Other

## 2022-05-18 VITALS — BP 149/58 | HR 66 | Temp 98.4°F | Resp 18 | Ht 66.0 in | Wt 200.3 lb

## 2022-05-18 DIAGNOSIS — C170 Malignant neoplasm of duodenum: Secondary | ICD-10-CM

## 2022-05-18 DIAGNOSIS — K769 Liver disease, unspecified: Secondary | ICD-10-CM | POA: Diagnosis not present

## 2022-05-18 DIAGNOSIS — Z5111 Encounter for antineoplastic chemotherapy: Secondary | ICD-10-CM | POA: Diagnosis not present

## 2022-05-18 DIAGNOSIS — D500 Iron deficiency anemia secondary to blood loss (chronic): Secondary | ICD-10-CM

## 2022-05-18 DIAGNOSIS — Z95828 Presence of other vascular implants and grafts: Secondary | ICD-10-CM

## 2022-05-18 DIAGNOSIS — Z1509 Genetic susceptibility to other malignant neoplasm: Secondary | ICD-10-CM | POA: Diagnosis not present

## 2022-05-18 LAB — CMP (CANCER CENTER ONLY)
ALT: 27 U/L (ref 0–44)
AST: 30 U/L (ref 15–41)
Albumin: 3.1 g/dL — ABNORMAL LOW (ref 3.5–5.0)
Alkaline Phosphatase: 94 U/L (ref 38–126)
Anion gap: 4 — ABNORMAL LOW (ref 5–15)
BUN: 14 mg/dL (ref 8–23)
CO2: 29 mmol/L (ref 22–32)
Calcium: 8.3 mg/dL — ABNORMAL LOW (ref 8.9–10.3)
Chloride: 110 mmol/L (ref 98–111)
Creatinine: 0.85 mg/dL (ref 0.61–1.24)
GFR, Estimated: >60 mL/min (ref >60)
Glucose, Bld: 79 mg/dL (ref 70–99)
Potassium: 3.8 mmol/L (ref 3.5–5.1)
Sodium: 143 mmol/L (ref 135–145)
Total Bilirubin: 0.9 mg/dL (ref 0.3–1.2)
Total Protein: 5.5 g/dL — ABNORMAL LOW (ref 6.5–8.1)

## 2022-05-18 LAB — CBC WITH DIFFERENTIAL (CANCER CENTER ONLY)
Abs Immature Granulocytes: 0.01 10*3/uL (ref 0.00–0.07)
Basophils Absolute: 0 10*3/uL (ref 0.0–0.1)
Basophils Relative: 1 %
Eosinophils Absolute: 0.2 10*3/uL (ref 0.0–0.5)
Eosinophils Relative: 4 %
HCT: 34.8 % — ABNORMAL LOW (ref 39.0–52.0)
Hemoglobin: 11.3 g/dL — ABNORMAL LOW (ref 13.0–17.0)
Immature Granulocytes: 0 %
Lymphocytes Relative: 27 %
Lymphs Abs: 1.3 10*3/uL (ref 0.7–4.0)
MCH: 27.4 pg (ref 26.0–34.0)
MCHC: 32.5 g/dL (ref 30.0–36.0)
MCV: 84.3 fL (ref 80.0–100.0)
Monocytes Absolute: 1 10*3/uL (ref 0.1–1.0)
Monocytes Relative: 21 %
Neutro Abs: 2.3 10*3/uL (ref 1.7–7.7)
Neutrophils Relative %: 47 %
Platelet Count: 130 10*3/uL — ABNORMAL LOW (ref 150–400)
RBC: 4.13 MIL/uL — ABNORMAL LOW (ref 4.22–5.81)
RDW: 18.1 % — ABNORMAL HIGH (ref 11.5–15.5)
WBC Count: 4.7 10*3/uL (ref 4.0–10.5)
nRBC: 0 % (ref 0.0–0.2)

## 2022-05-18 LAB — CEA (IN HOUSE-CHCC): CEA (CHCC-In House): 8.71 ng/mL — ABNORMAL HIGH (ref 0.00–5.00)

## 2022-05-18 MED ORDER — SODIUM CHLORIDE 0.9% FLUSH
10.0000 mL | Freq: Once | INTRAVENOUS | Status: AC
  Administered 2022-05-18: 10 mL

## 2022-05-18 NOTE — Progress Notes (Signed)
[place]   [phone]) [phone] Fax:(336) [phone]   Clinic Follow up Note   Patient Care Team: Biagio Borg, MD as PCP - General Stanford Breed, Denice Bors, MD as PCP - Cardiology (Cardiology) [name], MD as Consulting Physician (Gastroenterology) [name], MD as Consulting Physician (Orthopedic Surgery) [name], MD as Consulting Physician (Ophthalmology) [name], MD as Consulting Physician (Oncology)  Date of Service:  05/18/2022  CHIEF COMPLAINT: f/u of uodenal cancer    CURRENT THERAPY:  FOLFOX q14 days, starting 11/24/21   ASSESSMENT:  Aaron Moss is a 77 y.o. 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 Treatment delayed due to his stroke on 04/18/21. S/p 8 cycles neoadjuvant Keytruda 05/11/21 - 09/28/21. He tolerated well with mild fatigue.  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 -He began FOLFOX on 11/24/21. He has tolerated well overall with some taste changes and mild fatigue -restaging CT scan 02/20/2022 showed PR -restaging CT from 05/15/2022 showed improved masslike enlargement of the duodenal as well as fold nodularity and thickening along the stomach, however it showed a new liver lesion, indeterminate, will obtain liver MRI  -Patient complains of worsening fatigue, not able to do much activities at home, and worsening neuropathy.  I will hold on chemotherapy today and give him a break. -If liver MRI shows no new metastasis, I will change his treatment to maintenance Xeloda alone   PMS2-related Lynch syndrome (HNPCC4) -he has a personal history of stage pT3N0 colon cancer in 2000 and prostate cancer dx in 2015, s/p 8 weeks of IMRT with Dr. Valere Dross. -genetic testing and counseling on 04/07/21 confirmed a PMS2 mutation, as well as VUS in ATM, BLM, and SDHA.     PLAN: -discuss CT scan -order MRI of the  Liver to be done in next few weeks, phone visit after MRI  -Will cancel her chemo treatment today. 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 Simple appearing right renal cyst. 2. Otherwise negative abdominal ultrasound   03/18/2021 Procedure   Upper Endoscopy/Colonoscopy, Dr. Loletha Carrow  Upper Endoscopy Impression: - Normal esophagus. - A single submucosal papule (nodule) found in the stomach. - Duodenal mass. Biopsied. Concerning for malignancy.  Colonoscopy Impression: - Diverticulosis in the entire examined colon. - The examined portion of the ileum was normal. - Patent end-to-end colo-colonic anastomosis, characterized by healthy appearing mucosa. - Internal hemorrhoids. - No specimens collected.   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 Circumferential  wall thickening of the first portion of the duodenum, in keeping with known primary malignancy. 2. Enlarged, hypodense lymph nodes anterior to the  pancreatic head and in the portacaval station, concerning for nodal metastatic disease. 3. No other evidence of metastatic disease in the chest, abdomen, or pelvis. 4. Incidental note of a fluid attenuation lesion within the proximal pancreatic body measuring 1.9 x 1.4 cm, with prominence of the pancreatic duct distally, up to 0.6 cm. This lesion was present on remote prior examination dated 05/04/2009, and has slightly increased in size over a long period of time. This is consistent with a small IPMN and benign given indolent behavior over greater than 10 years. 5. Background of very fine centrilobular pulmonary nodules, most concentrated in the lung apices, most commonly seen in smoking-related respiratory bronchiolitis. 6. Coronary artery disease.   Aortic Atherosclerosis (ICD10-I70.0).    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 Of note, a variant of uncertain significance was detected in the ATM (p.A869G), BLM (c.800-3T>G), and SDHA (p.V632F) genes. Report date is 04/27/2021.  The CancerNext-Expanded gene panel offered by [facility] and includes sequencing, rearrangement, and RNA analysis for the following 77 genes: AIP, ALK, APC, ATM, AXIN2, BAP1, BARD1, BLM, BMPR1A, BRCA1, BRCA2, BRIP1, CDC73, CDH1, CDK4, CDKN1B, CDKN2A, CHEK2, CTNNA1, DICER1, FANCC, FH, FLCN, GALNT12, KIF1B, LZTR1, MAX, MEN1, MET, MLH1, MSH2, MSH3, MSH6, MUTYH, NBN, NF1, NF2, NTHL1, PALB2, PHOX2B, PMS2, POT1, PRKAR1A, PTCH1, PTEN, RAD51C, RAD51D, RB1, RECQL, RET, SDHA, SDHAF2, SDHB, SDHC, SDHD, SMAD4, SMARCA4, SMARCB1, SMARCE1, STK11, SUFU, TMEM127, TP53, TSC1, TSC2, VHL and XRCC2 (sequencing and deletion/duplication); EGFR, EGLN1, HOXB13, KIT, MITF, PDGFRA, POLD1, and POLE (sequencing only); EPCAM and GREM1 (deletion/duplication only).    05/11/2021 - 09/28/2021 Chemotherapy   Patient is on Treatment Plan :  COLORECTAL Pembrolizumab (200) q21d     11/24/2021 -  Chemotherapy   Patient is on Treatment Plan : COLORECTAL FOLFOX q14d x 4 months      Imaging     02/20/2022 Imaging    IMPRESSION: 1. Distal stomach/duodenal mass is decreased in size when compared with prior exam. 2. Stable upper abdominal adenopathy. 3. 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 4. Aortic Atherosclerosis (ICD10-I70.0).      INTERVAL HISTORY:  Aaron Moss is here for a follow up of uodenal cancer   He was last seen by  NP Lacie on 05/03/2022 He presents to the clinic accompanied by wife.Pt state he feels fatigue. Pt reports of having tingling in his feet and its getting worse.Pt rate the tingling at a 6. Pt stated after the last treatment is when it got worst. Pt stated his fingers are not as bad as his right foot. Pt state that when he drinks more the one Ensure in a day it gives him diarrhea.So it cut back to every other day.     All other systems were reviewed with the patient and are negative.  MEDICAL HISTORY:  Past Medical History:  Diagnosis Date   Anemia 10/31/2021   Dx IDA   Carotid stenosis, left    Colon cancer [facility]) 2000   Coronary artery disease    per Dr. Jacalyn Lefevre note from 04/13/21   Diverticulosis of colon    Duodenal cancer ([place]) 01/2021   GERD (gastroesophageal reflux disease)    Glaucoma  History of chemotherapy 2000   colon cancer   History of radiation therapy 08/06/13- 10/03/13   prostate 7800 cGy 40 sessions, seminal vesicles 5600 cGy 40 sessions   Hyperlipidemia    Hypertension    Left knee DJD    Prostate cancer ([place]) 05/23/2013   gleason 3+4=7, volume 11.5 ml   Stroke ([place]) 04/18/2021   TIA (transient ischemic attack)     SURGICAL HISTORY: Past Surgical History:  Procedure Laterality Date   CHOLECYSTECTOMY N/A 10/19/2021   Procedure:  CHOLECYSTECTOMY;  Surgeon: [name], MD;  Location: [place];  Service: General;  Laterality: N/A;   COLON SURGERY  2000   colon cancer   COLONOSCOPY     ENDARTERECTOMY Left 04/25/2021   Procedure: LEFT ENDARTERECTOMY CAROTID;  Surgeon: Marty Heck, MD;  Location: [place];  Service: Vascular;  Laterality: Left;   EUS     pancreatic cyst   KNEE ARTHROSCOPY  2007   LT- [facility]   LAPAROSCOPY N/A 10/19/2021   Procedure: LAPAROSCOPY DIAGNOSTIC;  Surgeon: [name], MD;  Location: [place];  Service: General;  Laterality: N/A;  GENERAL AND TAP BLOCK   PATCH ANGIOPLASTY Left 04/25/2021   Procedure: PATCH ANGIOPLASTY XENOSURE 1CM X 6CM;  Surgeon: Marty Heck, MD;  Location: [place];  Service: Vascular;  Laterality: Left;   PORTACATH PLACEMENT Left 11/17/2021   Procedure: PORT PLACEMENT;  Surgeon: [name], MD;  Location: [place];  Service: General;  Laterality: Left;   PROSTATE BIOPSY  05/23/13   gleason 7, volume 11.5 ml   WHIPPLE PROCEDURE N/A 10/19/2021   Procedure: ATTEMPTED WHIPPLE PROCEDURE  WITH GASTRODUODENAL BYPASS;  Surgeon: [name], MD;  Location: [place];  Service: General;  Laterality: N/A;  GENERAL AND TAP BLOCK    I have reviewed the social history and family history with the patient and they are unchanged from previous note.  ALLERGIES:  is allergic to lyrica [pregabalin].  MEDICATIONS:  Current Outpatient Medications  Medication Sig Dispense Refill   amLODipine (NORVASC) 10 MG tablet Take 1 tablet by mouth once daily 90 tablet 3   aspirin EC 81 MG EC tablet Take 1 tablet (81 mg total) by mouth daily. Swallow whole. 30 tablet 11   atorvastatin (LIPITOR) 40 MG tablet Take 1 tablet by mouth once daily 90 tablet 2   benazepril (LOTENSIN) 40 MG tablet Take 1 tablet by mouth once daily 90 tablet 3   carvedilol (COREG) 12.5 MG tablet Take 12.5 mg by mouth 2 (two) times daily.     iron polysaccharides (FERREX 150) 150 MG capsule Take 1 capsule by mouth twice daily  180 capsule 1   lidocaine-prilocaine (EMLA) cream Apply to affected area once 30 g 3   pantoprazole (PROTONIX) 40 MG tablet Take 1 tablet by mouth once daily 90 tablet 0   potassium chloride 20 MEQ TBCR Take 20 mEq by mouth 3 (three) times daily. 90 tablet 0   prochlorperazine (COMPAZINE) 10 MG tablet Take 1 tablet (10 mg total) by mouth every 6 (six) hours as needed for nausea or vomiting (Use for nausea and / or vomiting unresolved with ondansetron (Zofran).). 30 tablet 0   Travoprost, BAK Free, (TRAVATAN) 0.004 % SOLN ophthalmic solution Place 1 drop into both eyes at bedtime.     VITAMIN D PO Take 2 tablets by mouth daily. Gummies     No current facility-administered medications for this visit.    PHYSICAL EXAMINATION: ECOG PERFORMANCE STATUS: 2 - Symptomatic, <50% confined  to bed  Vitals:   05/18/22 1208  BP: (!) 149/58  Pulse: 66  Resp: 18  Temp: 98.4 F (36.9 C)  SpO2: 99%   Wt Readings from Last 3 Encounters:  05/18/22 200 lb 4.8 oz (90.9 kg)  05/16/22 196 lb (88.9 kg)  05/03/22 203 lb 3.2 oz (92.2 kg)     GENERAL:alert, no distress and comfortable SKIN: skin color normal, no rashes or significant lesions EYES: normal, Conjunctiva are pink and non-injected, sclera clear  NEURO: alert & oriented x 3 with fluent speech  LABORATORY DATA:  I have reviewed the data as listed    Latest Ref Rng & Units 05/18/2022   11:34 AM 05/03/2022    8:39 AM 04/20/2022   11:04 AM  CBC  WBC 4.0 - 10.5 K/uL 4.7  5.2  4.7   Hemoglobin 13.0 - 17.0 g/dL 11.3  11.6  11.2   Hematocrit 39.0 - 52.0 % 34.8  35.5  34.2   Platelets 150 - 400 K/uL 130  137  120         Latest Ref Rng & Units 05/18/2022   11:34 AM 05/03/2022    8:39 AM 04/20/2022   11:04 AM  CMP  Glucose 70 - 99 mg/dL 79  139  83   BUN 8 - 23 mg/dL '14  15  19   '$ Creatinine 0.61 - 1.24 mg/dL 0.85  0.92  0.87   Sodium 135 - 145 mmol/L 143  143  139   Potassium 3.5 - 5.1 mmol/L 3.8  3.6  3.7   Chloride 98 - 111 mmol/L 110  111   108   CO2 22 - 32 mmol/L '29  28  27   '$ Calcium 8.9 - 10.3 mg/dL 8.3  8.8  8.7   Total Protein 6.5 - 8.1 g/dL 5.5  5.9  5.9   Total Bilirubin 0.3 - 1.2 mg/dL 0.9  1.2  1.2   Alkaline Phos 38 - 126 U/L 94  102  97   AST 15 - 41 U/L '30  24  25   '$ ALT 0 - 44 U/L '27  22  23       '$ RADIOGRAPHIC STUDIES: I have personally reviewed the radiological images as listed and agreed with the findings in the report. No results found.    Orders Placed This Encounter  Procedures   MR Abdomen W Wo Contrast    Eovist liver MRI protocol to evaluate the liver lesion on recent CT , rule out metastasis [facility] MCR  Epic Order  Wt  200 Ht 5'6 / NO needs/ NO Claus/No to all covid ?'s/Aware of CX fee/NO Metal in eyes or removed/ PORT ACCESSED  NO implants inside or outside,BB's, bullets or glucose monitors , defibrillators,  stimulator, or injectors or pacemaker,  brain anuerysm clip, or  COLON SX,  No hx of sx  brain, heart, eyes or ears/NKDA to contrast/ No renal Failure, Not on Dialysis/ w/ Pt [facility] 05/18/2022    Standing Status:   Future    Standing Expiration Date:   05/18/2023    Order Specific Question:   If indicated for the ordered procedure, I authorize the administration of contrast media per Radiology protocol    Answer:   Yes    Order Specific Question:   What is the patient's sedation requirement?    Answer:   No Sedation    Order Specific Question:   Does the patient have a pacemaker or implanted devices?  Answer:   No    Order Specific Question:   Preferred imaging location?    Answer:   [facility] (table limit-550lbs)   All questions were answered. The patient knows to call the clinic with any problems, questions or concerns. No barriers to learning was detected. The total time spent in the appointment was 30 minutes.     [name], MD 05/18/2022   Felicity Coyer, CMA, am acting as scribe for [name], MD.   I have reviewed the above documentation for accuracy and completeness,  and I agree with the above.

## 2022-05-20 ENCOUNTER — Inpatient Hospital Stay: Payer: Medicare Other

## 2022-05-22 ENCOUNTER — Ambulatory Visit: Payer: Medicare Other | Admitting: Internal Medicine

## 2022-05-22 VITALS — BP 136/64 | HR 72 | Temp 98.6°F | Ht 66.0 in | Wt 200.0 lb

## 2022-05-22 DIAGNOSIS — E7800 Pure hypercholesterolemia, unspecified: Secondary | ICD-10-CM

## 2022-05-22 DIAGNOSIS — M25561 Pain in right knee: Secondary | ICD-10-CM | POA: Diagnosis not present

## 2022-05-22 DIAGNOSIS — I10 Essential (primary) hypertension: Secondary | ICD-10-CM

## 2022-05-22 DIAGNOSIS — Z125 Encounter for screening for malignant neoplasm of prostate: Secondary | ICD-10-CM | POA: Diagnosis not present

## 2022-05-22 DIAGNOSIS — M25562 Pain in left knee: Secondary | ICD-10-CM

## 2022-05-22 DIAGNOSIS — Z0001 Encounter for general adult medical examination with abnormal findings: Secondary | ICD-10-CM

## 2022-05-22 DIAGNOSIS — E559 Vitamin D deficiency, unspecified: Secondary | ICD-10-CM

## 2022-05-22 DIAGNOSIS — R739 Hyperglycemia, unspecified: Secondary | ICD-10-CM

## 2022-05-22 DIAGNOSIS — E538 Deficiency of other specified B group vitamins: Secondary | ICD-10-CM

## 2022-05-22 DIAGNOSIS — G8929 Other chronic pain: Secondary | ICD-10-CM

## 2022-05-22 LAB — LIPID PANEL
Cholesterol: 134 mg/dL (ref 0–200)
HDL: 38.9 mg/dL — ABNORMAL LOW (ref >39.00)
LDL Cholesterol: 63 mg/dL (ref 0–99)
NonHDL: 94.86
Total CHOL/HDL Ratio: 3
Triglycerides: 157 mg/dL — ABNORMAL HIGH (ref 0.0–149.0)
VLDL: 31.4 mg/dL (ref 0.0–40.0)

## 2022-05-22 LAB — URINALYSIS, ROUTINE W REFLEX MICROSCOPIC
Bilirubin Urine: NEGATIVE
Hgb urine dipstick: NEGATIVE
Ketones, ur: NEGATIVE
Leukocytes,Ua: NEGATIVE
Nitrite: NEGATIVE
RBC / HPF: NONE SEEN (ref 0–?)
Specific Gravity, Urine: 1.02 (ref 1.000–1.030)
Total Protein, Urine: NEGATIVE
Urine Glucose: NEGATIVE
Urobilinogen, UA: 0.2 (ref 0.0–1.0)
pH: 6 (ref 5.0–8.0)

## 2022-05-22 LAB — MICROALBUMIN / CREATININE URINE RATIO
Creatinine,U: 141.8 mg/dL
Microalb Creat Ratio: 0.9 mg/g (ref 0.0–30.0)
Microalb, Ur: 1.3 mg/dL (ref 0.0–1.9)

## 2022-05-22 LAB — VITAMIN B12: Vitamin B-12: 709 pg/mL (ref 211–911)

## 2022-05-22 LAB — TSH: TSH: 1.73 u[IU]/mL (ref 0.35–5.50)

## 2022-05-22 LAB — HEMOGLOBIN A1C: Hgb A1c MFr Bld: 5.7 % (ref 4.6–6.5)

## 2022-05-22 LAB — PSA: PSA: 0.12 ng/mL (ref 0.10–4.00)

## 2022-05-22 LAB — VITAMIN D 25 HYDROXY (VIT D DEFICIENCY, FRACTURES): VITD: 33.75 ng/mL (ref 30.00–100.00)

## 2022-05-22 MED ORDER — TRAMADOL HCL 50 MG PO TABS: 50.0000 mg | ORAL_TABLET | Freq: Two times a day (BID) | ORAL | 2 refills | Status: DC | PRN

## 2022-05-22 NOTE — Progress Notes (Signed)
Patient ID: Aaron Moss, male   DOB: 1945/11/13, 77 y.o.   MRN: [id number]         Chief Complaint:: wellness exam and 30mofollow up (No concerns or questions) and Medication Refill (Tramadol-patient states this medication was removed from med list but is still taking)  For chronic bilateral knee pain, htn, hyperglycemia, hld, low vit d       HPI:  Aaron Moss a 77y.o. male here for wellness exam; declines covid booster, for shingrix at pharmacy, o/w up to date                         Also BP has been controlled at home.  Pt denies chest pain, increased sob or doe, wheezing, orthopnea, PND, increased LE swelling, palpitations, dizziness or syncope.   Pt denies polydipsia, polyuria, or new focal neuro s/s.    Pt denies fever, wt loss, night sweats, loss of appetite, or other constitutional symptoms  BP at home < 140/90.  Has chronic persistent bilateral knee pain, worse recently, ran out of tramadol , asks for restart.   Wt Readings from Last 3 Encounters:  05/22/22 200 lb (90.7 kg)  05/18/22 200 lb 4.8 oz (90.9 kg)  05/16/22 196 lb (88.9 kg)   BP Readings from Last 3 Encounters:  05/22/22 136/64  05/18/22 (!) 149/58  05/16/22 134/75   Immunization History  Administered Date(s) Administered   Fluad Quad(high Dose 65+) 11/16/2020, 01/31/2022   Influenza Whole 04/30/2009, 04/25/2010   Influenza, High Dose Seasonal PF 12/05/2016, 02/17/2018, 12/12/2018   Influenza,inj,Quad PF,6+ Mos 03/05/2013, 04/27/2014, 11/30/2015   Influenza-Unspecified 11/19/2014, 12/26/2019   PFIZER(Purple Top)SARS-COV-2 Vaccination 04/10/2019, 04/28/2019, 12/26/2019, 10/01/2020, 12/20/2020   Pneumococcal Conjugate-13 03/26/2013   Pneumococcal Polysaccharide-23 06/08/2009   Td 03/21/2003   Tdap 05/28/2015, 06/21/2020   Zoster, Live 02/19/2006  There are no preventive care reminders to display for this patient.    Past Medical History:  Diagnosis Date   Anemia 10/31/2021   Dx IDA   Carotid stenosis,  left    [place]   Coronary artery disease    per Dr. CJacalyn Lefevrenote from 04/13/21   Diverticulosis of colon    [place]   GERD (gastroesophageal reflux disease)    Glaucoma    History of chemotherapy 2000   colon cancer   History of radiation therapy 08/06/13- 10/03/13   prostate 7800 cGy 40 sessions, seminal vesicles 5600 cGy 40 sessions   Hyperlipidemia    Hypertension    Left knee DJD    [place]   gleason 3+4=7, volume 11.5 ml   [place] 04/18/2021   TIA (transient ischemic attack)    Past Surgical History:  Procedure Laterality Date   CHOLECYSTECTOMY N/A 10/19/2021   Procedure: CHOLECYSTECTOMY;  Surgeon: BStark Klein MD;  [facility] [place] [facility]  Service: General;  Laterality: N/A;   COLON SURGERY  2000   colon cancer   COLONOSCOPY     ENDARTERECTOMY Left 04/25/2021   Procedure: LEFT ENDARTERECTOMY CAROTID;  Surgeon: CMarty Heck MD;  [place]  Service: Vascular;  Laterality: Left;   EUS     pancreatic cyst   KNEE ARTHROSCOPY  2007   LT- [facility]   LAPAROSCOPY N/A 10/19/2021   Procedure: LAPAROSCOPY DIAGNOSTIC;  Surgeon: BStark Klein MD;  [place]  Service: General;  Laterality: N/A;  GENERAL AND TAP BLOCK   PATCH ANGIOPLASTY Left  04/25/2021   Procedure: PATCH ANGIOPLASTY XENOSURE 1CM X 6CM;  Surgeon: Marty Heck, MD;  Location: [place];  Service: Vascular;  Laterality: Left;   PORTACATH PLACEMENT Left 11/17/2021   Procedure: PORT PLACEMENT;  Surgeon: [name], MD;  Location: [place];  Service: General;  Laterality: Left;   PROSTATE BIOPSY  05/23/13   gleason 7, volume 11.5 ml   WHIPPLE PROCEDURE N/A 10/19/2021   Procedure: ATTEMPTED WHIPPLE PROCEDURE  WITH GASTRODUODENAL BYPASS;  Surgeon: [name], MD;  Location: [place];  Service: General;  Laterality: N/A;  GENERAL AND TAP BLOCK    reports that he quit smoking about 34 years ago. His smoking use included cigarettes. He has a 22.00  pack-year smoking history. He has never used smokeless tobacco. He reports that he does not currently use alcohol. He reports that he does not use drugs. family history includes Breast cancer in his maternal grandmother; Cancer in his brother, maternal aunt, and mother; Cancer (age of onset: 14) in his daughter; Colon cancer (age of onset: 86) in his sister; Diabetes in his brother. Allergies  Allergen Reactions   Lyrica [Pregabalin] Other (See Comments)    "Just made me feel bad"   Current Outpatient Medications on File Prior to Visit  Medication Sig Dispense Refill   amLODipine (NORVASC) 10 MG tablet Take 1 tablet by mouth once daily 90 tablet 3   aspirin EC 81 MG EC tablet Take 1 tablet (81 mg total) by mouth daily. Swallow whole. 30 tablet 11   atorvastatin (LIPITOR) 40 MG tablet Take 1 tablet by mouth once daily 90 tablet 2   benazepril (LOTENSIN) 40 MG tablet Take 1 tablet by mouth once daily 90 tablet 3   carvedilol (COREG) 12.5 MG tablet Take 12.5 mg by mouth 2 (two) times daily.     iron polysaccharides (FERREX 150) 150 MG capsule Take 1 capsule by mouth twice daily 180 capsule 1   lidocaine-prilocaine (EMLA) cream Apply to affected area once 30 g 3   pantoprazole (PROTONIX) 40 MG tablet Take 1 tablet by mouth once daily 90 tablet 0   potassium chloride 20 MEQ TBCR Take 20 mEq by mouth 3 (three) times daily. 90 tablet 0   potassium chloride SA (KLOR-CON M20) 20 MEQ tablet Take 1 tablet by mouth 3 (three) times daily.     prochlorperazine (COMPAZINE) 10 MG tablet Take 1 tablet (10 mg total) by mouth every 6 (six) hours as needed for nausea or vomiting (Use for nausea and / or vomiting unresolved with ondansetron (Zofran).). 30 tablet 0   Travoprost, BAK Free, (TRAVATAN) 0.004 % SOLN ophthalmic solution Place 1 drop into both eyes at bedtime.     VITAMIN D PO Take 2 tablets by mouth daily. Gummies     No current facility-administered medications on file prior to visit.        ROS:   All others reviewed and negative.  Objective        PE:  BP 136/64 (BP Location: Right Arm, Patient Position: Sitting, Cuff Size: Large)   Pulse 72   Temp 98.6 F (37 C) (Oral)   Ht '5\' 6"'$  (1.676 m)   Wt 200 lb (90.7 kg)   SpO2 98%   BMI 32.28 kg/m                 Constitutional: Pt appears in NAD               HENT: Head: NCAT.  Right Ear: External ear normal.                 Left Ear: External ear normal.                Eyes: . Pupils are equal, round, and reactive to light. Conjunctivae and EOM are normal               Nose: without d/c or deformity               Neck: Neck supple. Gross normal ROM               Cardiovascular: Normal rate and regular rhythm.                 Pulmonary/Chest: Effort normal and breath sounds without rales or wheezing.                Abd:  Soft, NT, ND, + BS, no organomegaly               Neurological: Pt is alert. At baseline orientation, motor grossly intact               Skin: Skin is warm. No rashes, no other new lesions, LE edema - none               Psychiatric: Pt behavior is normal without agitation   Micro: none  Cardiac tracings I have personally interpreted today:  none  Pertinent Radiological findings (summarize): none   Lab Results  Component Value Date   WBC 4.7 05/18/2022   HGB 11.3 (L) 05/18/2022   HCT 34.8 (L) 05/18/2022   PLT 130 (L) 05/18/2022   GLUCOSE 79 05/18/2022   CHOL 134 05/22/2022   TRIG 157.0 (H) 05/22/2022   HDL 38.90 (L) 05/22/2022   LDLDIRECT 185.6 05/15/2007   LDLCALC 63 05/22/2022   ALT 27 05/18/2022   AST 30 05/18/2022   NA 143 05/18/2022   K 3.8 05/18/2022   CL 110 05/18/2022   CREATININE 0.85 05/18/2022   BUN 14 05/18/2022   CO2 29 05/18/2022   TSH 1.73 05/22/2022   PSA 0.12 05/22/2022   INR 1.2 10/22/2021   HGBA1C 5.7 05/22/2022   MICROALBUR 1.3 05/22/2022   Assessment/Plan:  Aaron Moss is a 77 y.o. Black or African American [2] male with  has a past medical history of  Anemia (10/31/2021), Carotid stenosis, left, Colon cancer ([place]) (2000), Coronary artery disease, Diverticulosis of colon, Duodenal cancer ([place]) (01/2021), GERD (gastroesophageal reflux disease), Glaucoma, History of chemotherapy (2000), History of radiation therapy (08/06/13- 10/03/13), Hyperlipidemia, Hypertension, Left knee DJD, Prostate cancer ([place]) (05/23/2013), Stroke ([place]) (04/18/2021), and TIA (transient ischemic attack).  Encounter for well adult exam with abnormal findings Age and sex appropriate education and counseling updated with regular exercise and diet Referrals for preventative services - none needed Immunizations addressed - declines covid booster, for shingrix at pharmacy Smoking counseling  - none needed Evidence for depression or other mood disorder - none significant Most recent labs reviewed. I have personally reviewed and have noted: 1) the patient's medical and social history 2) The patient's current medications and supplements 3) The patient's height, weight, and BMI have been recorded in the chart   Hyperglycemia Lab Results  Component Value Date   HGBA1C 5.7 05/22/2022   Stable, pt to continue current medical treatment  - diet, wt control   Hyperlipidemia Lab Results  Component Value Date   LDLCALC 63 05/22/2022  Stable, pt to continue current statin lipitor 40 mg qd   Hypertension, uncontrolled BP Readings from Last 3 Encounters:  05/22/22 136/64  05/18/22 (!) 149/58  05/16/22 134/75   Stable, pt to continue medical treatment norvasc 10 mg qd, lotensin 40 qd, coreg 12.5 bid   Vitamin D deficiency Last vitamin D Lab Results  Component Value Date   VD25OH 33.75 05/22/2022   Low, to start oral replacement   Bilateral knee pain Chronic with recent worsening overall, ok for restart tramadol prn, declines ortho referral for now,  to f/u any worsening symptoms or concerns  Followup: Return in about 6 months (around 11/22/2022).  Cathlean Cower, MD  05/24/2022 8:37 PM [facility]

## 2022-05-22 NOTE — Patient Instructions (Addendum)
Please have your Shingrix (shingles) shots done at your local pharmacy.  Ok to restart the tramadol twice per day as needed  Please continue all other medications as before, and refills have been done if requested.  Please have the pharmacy call with any other refills you may need.  Please continue your efforts at being more active, low cholesterol diet, and weight control.  You are otherwise up to date with prevention measures today.  Please keep your appointments with your specialists as you may have planned  Please go to the LAB at the blood drawing area for the tests to be done  You will be contacted by phone if any changes need to be made immediately.  Otherwise, you will receive a letter about your results with an explanation, but please check with MyChart first.  Please remember to sign up for MyChart if you have not done so, as this will be important to you in the future with finding out test results, communicating by private email, and scheduling acute appointments online when needed.  Please make an Appointment to return in 6 months, or sooner if needed,

## 2022-05-24 ENCOUNTER — Encounter: Payer: Self-pay | Admitting: Internal Medicine

## 2022-05-24 NOTE — Assessment & Plan Note (Signed)
Lab Results  Component Value Date   LDLCALC 63 05/22/2022   Stable, pt to continue current statin lipitor 40 mg qd

## 2022-05-24 NOTE — Assessment & Plan Note (Signed)
Chronic with recent worsening overall, ok for restart tramadol prn, declines ortho referral for now,  to f/u any worsening symptoms or concerns

## 2022-05-24 NOTE — Assessment & Plan Note (Signed)
Lab Results  Component Value Date   HGBA1C 5.7 05/22/2022   Stable, pt to continue current medical treatment  - diet, wt control

## 2022-05-24 NOTE — Assessment & Plan Note (Signed)
Age and sex appropriate education and counseling updated with regular exercise and diet Referrals for preventative services - none needed Immunizations addressed - declines covid booster, for shingrix at pharmacy Smoking counseling  - none needed Evidence for depression or other mood disorder - none significant Most recent labs reviewed. I have personally reviewed and have noted: 1) the patient's medical and social history 2) The patient's current medications and supplements 3) The patient's height, weight, and BMI have been recorded in the chart

## 2022-05-24 NOTE — Assessment & Plan Note (Signed)
Last vitamin D Lab Results  Component Value Date   VD25OH 33.75 05/22/2022   Low, to start oral replacement

## 2022-05-24 NOTE — Assessment & Plan Note (Signed)
BP Readings from Last 3 Encounters:  05/22/22 136/64  05/18/22 (!) 149/58  05/16/22 134/75   Stable, pt to continue medical treatment norvasc 10 mg qd, lotensin 40 qd, coreg 12.5 bid

## 2022-05-31 MED FILL — Dexamethasone Sodium Phosphate Inj 100 MG/10ML: INTRAMUSCULAR | Qty: 1 | Status: AC

## 2022-06-01 ENCOUNTER — Inpatient Hospital Stay: Payer: Medicare Other | Admitting: Hematology

## 2022-06-01 ENCOUNTER — Inpatient Hospital Stay: Payer: Medicare Other

## 2022-06-01 ENCOUNTER — Telehealth: Payer: Self-pay

## 2022-06-01 ENCOUNTER — Other Ambulatory Visit: Payer: Self-pay

## 2022-06-01 NOTE — Progress Notes (Deleted)
[place]   [phone] [phone]   Clinic Follow up Note   Patient Care Team: Biagio Borg, MD as PCP - General Stanford Breed, Denice Bors, MD as PCP - Cardiology (Cardiology) [name], MD as Consulting Physician (Gastroenterology) [name], MD as Consulting Physician (Orthopedic Surgery) [name], MD as Consulting Physician (Ophthalmology) [name], MD as Consulting Physician (Oncology)  Date of Service:  06/01/2022  I connected with Aaron Moss on 06/01/2022 at  2:00 PM EDT by {Blank single:19197::"video enabled telemedicine visit","telephone visit"} and verified that I am speaking with the correct person using two identifiers.  I discussed the limitations, risks, security and privacy concerns of performing an evaluation and management service by telephone and the availability of in person appointments. I also discussed with the patient that there may be a patient responsible charge related to this service. The patient expressed understanding and agreed to proceed.   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 Treatment delayed due to his stroke on 04/18/21. S/p 8 cycles neoadjuvant Keytruda 05/11/21 - 09/28/21. He tolerated well with mild fatigue.  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 -He began FOLFOX on 11/24/21. 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 Simple appearing right renal cyst. 2. Otherwise negative abdominal ultrasound   03/18/2021 Procedure   Upper Endoscopy/Colonoscopy, Dr. Loletha Carrow  Upper Endoscopy Impression: - Normal esophagus. - A single submucosal papule (nodule) found in the stomach. - Duodenal mass. Biopsied. Concerning for malignancy.  Colonoscopy Impression: - Diverticulosis in the entire examined colon. - The examined portion of the ileum was normal. - Patent end-to-end colo-colonic anastomosis, characterized by healthy appearing mucosa. - Internal hemorrhoids. - No specimens collected.   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 Circumferential wall thickening of the first portion of the duodenum, in keeping with known primary malignancy.  2. Enlarged, hypodense lymph nodes anterior  to the pancreatic head and in the portacaval station, concerning for nodal metastatic disease. 3. No other evidence of metastatic disease in the chest, abdomen, or pelvis. 4. Incidental note of a fluid attenuation lesion within the proximal pancreatic body measuring 1.9 x 1.4 cm, with prominence of the pancreatic duct distally, up to 0.6 cm. This lesion was present on remote prior examination dated 05/04/2009, and has slightly increased in size over a long period of time. This is consistent with a small IPMN and benign given indolent behavior over greater than 10 years. 5. Background of very fine centrilobular pulmonary nodules, most concentrated in the lung apices, most commonly seen in smoking-related respiratory bronchiolitis. 6. Coronary artery disease.   Aortic Atherosclerosis (ICD10-I70.0).    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 Of note, a variant of uncertain significance was detected in the ATM (p.A869G), BLM (c.800-3T>G), and SDHA (p.V632F) genes. Report date is 04/27/2021.  The CancerNext-Expanded gene panel offered by [facility] and includes sequencing, rearrangement, and RNA analysis for the following 77 genes: AIP, ALK, APC, ATM, AXIN2, BAP1, BARD1, BLM, BMPR1A, BRCA1, BRCA2, BRIP1, CDC73, CDH1, CDK4, CDKN1B, CDKN2A, CHEK2, CTNNA1, DICER1, FANCC, FH, FLCN, GALNT12, KIF1B, LZTR1, MAX, MEN1, MET, MLH1, MSH2, MSH3, MSH6, MUTYH, NBN, NF1, NF2, NTHL1, PALB2, PHOX2B, PMS2, POT1, PRKAR1A, PTCH1, PTEN, RAD51C, RAD51D, RB1, RECQL, RET, SDHA, SDHAF2, SDHB, SDHC, SDHD, SMAD4, SMARCA4, SMARCB1, SMARCE1, STK11, SUFU, TMEM127, TP53, TSC1, TSC2, VHL and XRCC2 (sequencing and deletion/duplication); EGFR, EGLN1, HOXB13, KIT, MITF, PDGFRA, POLD1, and POLE (sequencing only); EPCAM and GREM1 (deletion/duplication only).    05/11/2021 - 09/28/2021 Chemotherapy   Patient is on Treatment  Plan : COLORECTAL Pembrolizumab (200) q21d     11/24/2021 -  Chemotherapy   Patient is on Treatment Plan : COLORECTAL FOLFOX q14d x 4 months      Imaging     02/20/2022 Imaging    IMPRESSION: 1. Distal stomach/duodenal mass is decreased in size when compared with prior exam. 2. Stable upper abdominal adenopathy. 3. 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 4. Aortic Atherosclerosis (ICD10-I70.0).      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 He was last seen by me on 05/18/2022.    All other systems were reviewed with the patient and are negative.  MEDICAL HISTORY:  Past Medical History:  Diagnosis Date   Anemia 10/31/2021   Dx IDA   Carotid stenosis, left    Colon cancer ([place]) 2000   Coronary artery disease    per Dr. Jacalyn Lefevre note from 04/13/21   Diverticulosis of colon    Duodenal cancer ([place]) 01/2021   GERD (gastroesophageal reflux disease)    Glaucoma    History of chemotherapy 2000   colon cancer   History of radiation therapy 08/06/13- 10/03/13   prostate 7800 cGy 40 sessions, seminal vesicles 5600 cGy 40 sessions   Hyperlipidemia    Hypertension    Left knee DJD    Prostate cancer ([place]) 05/23/2013   gleason 3+4=7, volume 11.5 ml   Stroke ([place]) 04/18/2021   TIA (transient ischemic attack)     SURGICAL HISTORY: Past Surgical History:  Procedure Laterality Date   CHOLECYSTECTOMY N/A 10/19/2021   Procedure: CHOLECYSTECTOMY;  Surgeon: Barry Dienes,  Dorris Fetch, MD;  Location: [facility];  Service: General;  Laterality: N/A;   COLON SURGERY  2000   colon cancer   COLONOSCOPY     ENDARTERECTOMY Left 04/25/2021   Procedure: LEFT ENDARTERECTOMY CAROTID;  Surgeon: Marty Heck, MD;  Location: [facility];  Service: Vascular;  Laterality: Left;   EUS     pancreatic cyst   KNEE ARTHROSCOPY  2007    LT- [facility]   LAPAROSCOPY N/A 10/19/2021   Procedure: LAPAROSCOPY DIAGNOSTIC;  Surgeon: [name], MD;  Location: [facility];  Service: General;  Laterality: N/A;  GENERAL AND TAP BLOCK   PATCH ANGIOPLASTY Left 04/25/2021   Procedure: PATCH ANGIOPLASTY XENOSURE 1CM X 6CM;  Surgeon: Marty Heck, MD;  Location: [facility];  Service: Vascular;  Laterality: Left;   PORTACATH PLACEMENT Left 11/17/2021   Procedure: PORT PLACEMENT;  Surgeon: [name], MD;  Location: [facility];  Service: General;  Laterality: Left;   PROSTATE BIOPSY  05/23/13   gleason 7, volume 11.5 ml   WHIPPLE PROCEDURE N/A 10/19/2021   Procedure: ATTEMPTED WHIPPLE PROCEDURE  WITH GASTRODUODENAL BYPASS;  Surgeon: [name], MD;  Location: [facility];  Service: General;  Laterality: N/A;  GENERAL AND TAP BLOCK    I have reviewed the social history and family history with the patient and they are unchanged from previous note.  ALLERGIES:  is allergic to lyrica [pregabalin].  MEDICATIONS:  Current Outpatient Medications  Medication Sig Dispense Refill   amLODipine (NORVASC) 10 MG tablet Take 1 tablet by mouth once daily 90 tablet 3   aspirin EC 81 MG EC tablet Take 1 tablet (81 mg total) by mouth daily. Swallow whole. 30 tablet 11   atorvastatin (LIPITOR) 40 MG tablet Take 1 tablet by mouth once daily 90 tablet 2   benazepril (LOTENSIN) 40 MG tablet Take 1 tablet by mouth once daily 90 tablet 3   carvedilol (COREG) 12.5 MG tablet Take 12.5 mg by mouth 2 (two) times daily.     iron polysaccharides (FERREX 150) 150 MG capsule Take 1 capsule by mouth twice daily 180 capsule 1   lidocaine-prilocaine (EMLA) cream Apply to affected area once 30 g 3   pantoprazole (PROTONIX) 40 MG tablet Take 1 tablet by mouth once daily 90 tablet 0   potassium chloride 20 MEQ TBCR Take 20 mEq by mouth 3 (three) times daily. 90 tablet 0   potassium chloride SA (KLOR-CON M20) 20 MEQ tablet Take 1 tablet by mouth 3 (three) times daily.     prochlorperazine  (COMPAZINE) 10 MG tablet Take 1 tablet (10 mg total) by mouth every 6 (six) hours as needed for nausea or vomiting (Use for nausea and / or vomiting unresolved with ondansetron (Zofran).). 30 tablet 0   traMADol (ULTRAM) 50 MG tablet Take 1 tablet (50 mg total) by mouth every 12 (twelve) hours as needed. 60 tablet 2   Travoprost, BAK Free, (TRAVATAN) 0.004 % SOLN ophthalmic solution Place 1 drop into both eyes at bedtime.     VITAMIN D PO Take 2 tablets by mouth daily. Gummies     No current facility-administered medications for this visit.    PHYSICAL EXAMINATION: ECOG PERFORMANCE STATUS: {CHL ONC ECOG PS:[id number]}  There were no vitals filed for this visit. Wt Readings from Last 3 Encounters:  05/22/22 200 lb (90.7 kg)  05/18/22 200 lb 4.8 oz (90.9 kg)  05/16/22 196 lb (88.9 kg)    *** No vitals taken today, Exam not performed today  LABORATORY DATA:  I have reviewed the data as listed    Latest Ref Rng & Units 05/18/2022   11:34 AM 05/03/2022    8:39 AM 04/20/2022   11:04 AM  CBC  WBC 4.0 - 10.5 K/uL 4.7  5.2  4.7   Hemoglobin 13.0 - 17.0 g/dL 11.3  11.6  11.2   Hematocrit 39.0 - 52.0 % 34.8  35.5  34.2   Platelets 150 - 400 K/uL 130  137  120         Latest Ref Rng & Units 05/18/2022   11:34 AM 05/03/2022    8:39 AM 04/20/2022   11:04 AM  CMP  Glucose 70 - 99 mg/dL 79  139  83   BUN 8 - 23 mg/dL '14  15  19   '$ Creatinine 0.61 - 1.24 mg/dL 0.85  0.92  0.87   Sodium 135 - 145 mmol/L 143  143  139   Potassium 3.5 - 5.1 mmol/L 3.8  3.6  3.7   Chloride 98 - 111 mmol/L 110  111  108   CO2 22 - 32 mmol/L '29  28  27   '$ Calcium 8.9 - 10.3 mg/dL 8.3  8.8  8.7   Total Protein 6.5 - 8.1 g/dL 5.5  5.9  5.9   Total Bilirubin 0.3 - 1.2 mg/dL 0.9  1.2  1.2   Alkaline Phos 38 - 126 U/L 94  102  97   AST 15 - 41 U/L '30  24  25   '$ ALT 0 - 44 U/L '27  22  23       '$ RADIOGRAPHIC STUDIES: I have personally reviewed the radiological images as listed and agreed with the findings in the  report. No results found.    No orders of the defined types were placed in this encounter.  All questions were answered. The patient knows to call the clinic with any problems, questions or concerns. No barriers to learning was detected. The total time spent in the appointment was {CHL ONC TIME VISIT - [id number]:[id number].     Baldemar Friday, CMA 06/01/2022   Felicity Coyer am acting as scribe for [name], MD.   {Add scribe attestation statement}

## 2022-06-01 NOTE — Telephone Encounter (Signed)
LVM for pt and spouse Aaron Moss informing them both that Dr. Burr Medico has requested for the pt's MRI of Abdomen be rescheduled to an earlier date w/[facility].  Informed both that the pt's MRI has been changed from 06/09/2022 to 06/05/2022 '@12'$ :30 NPO 4hrs prior.  Informed both that Dr. Burr Medico would like to discuss the results of the MRI prior to Dr. Burr Medico leaving on vacation.  Instructed pt and spouse Aaron Moss to please contact Dr. Ernestina Penna office once they receive this message.

## 2022-06-01 NOTE — Assessment & Plan Note (Signed)
-  diagnosed 02/2021 by endo-/colonoscopy for weight loss and anemia. Treatment delayed due to his stroke on 04/18/21. S/p 8 cycles neoadjuvant Keytruda 05/11/21 - 09/28/21. He tolerated well with mild fatigue.  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 -He began FOLFOX on 11/24/21. He has tolerated well overall with some taste changes and mild fatigue -restaging CT scan 02/20/2022 showed PR -restaging CT from 05/15/2022 showed improved masslike enlargement of the duodenal as well as fold nodularity and thickening along the stomach, however it showed a new liver lesion, indeterminate, will obtain liver MRI  -Patient complains of worsening fatigue, not able to do much activities at home, and worsening neuropathy

## 2022-06-03 ENCOUNTER — Inpatient Hospital Stay: Payer: Medicare Other

## 2022-06-05 ENCOUNTER — Ambulatory Visit
Admission: RE | Admit: 2022-06-05 | Discharge: 2022-06-05 | Disposition: A | Discharge status: Home / Self Care | Payer: Medicare Other | Source: Ambulatory Visit | Attending: Hematology | Admitting: Hematology

## 2022-06-05 DIAGNOSIS — K769 Liver disease, unspecified: Secondary | ICD-10-CM

## 2022-06-05 MED ORDER — HEPARIN SOD (PORK) LOCK FLUSH 100 UNIT/ML IV SOLN
500.0000 [IU] | Freq: Once | INTRAVENOUS | Status: AC
  Administered 2022-06-05: 500 [IU] via INTRAVENOUS

## 2022-06-05 MED ORDER — SODIUM CHLORIDE 0.9% FLUSH
10.0000 mL | INTRAVENOUS | Status: DC | PRN
  Administered 2022-06-05: 10 mL via INTRAVENOUS

## 2022-06-05 MED ORDER — GADOXETATE DISODIUM 0.25 MMOL/ML IV SOLN
9.0000 mL | Freq: Once | INTRAVENOUS | Status: AC | PRN
  Administered 2022-06-05: 9 mL via INTRAVENOUS

## 2022-06-05 NOTE — Assessment & Plan Note (Signed)
-  diagnosed 02/2021 by endo-/colonoscopy for weight loss and anemia. Treatment delayed due to his stroke on 04/18/21. S/p 8 cycles neoadjuvant Keytruda 05/11/21 - 09/28/21. He tolerated well with mild fatigue.  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 -He began FOLFOX on 11/24/21. He has tolerated well overall with some taste changes and mild fatigue -restaging CT scan 02/20/2022 showed PR -restaging CT from 05/15/2022 showed improved masslike enlargement of the duodenal as well as fold nodularity and thickening along the stomach, however it showed a new liver lesion, indeterminate, will obtain liver MRI  -Patient complained of worsening fatigue, last cycle chemo was held on 05/18/22.

## 2022-06-05 NOTE — Progress Notes (Unsigned)
[place]   [phone] [phone]   Clinic Follow up Note   Patient Care Team: Biagio Borg, MD as PCP - General Stanford Breed, Denice Bors, MD as PCP - Cardiology (Cardiology) [name], MD as Consulting Physician (Gastroenterology) [name], MD as Consulting Physician (Orthopedic Surgery) [name], MD as Consulting Physician (Ophthalmology) [name], MD as Consulting Physician (Oncology)  Date of Service:  06/06/2022  I connected with Aaron Moss on 06/06/2022 at  9:00 AM EDT by telephone visit and verified that I am speaking with the correct person using two identifiers.  I discussed the limitations, risks, security and privacy concerns of performing an evaluation and management service by telephone and the availability of in person appointments. I also discussed with the patient that there may be a patient responsible charge related to this service. The patient expressed understanding and agreed to proceed.   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 3 tablets in the Morning and 4 tablets in the evening   ASSESSMENT & PLAN:  Aaron Moss is a 77 y.o. 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 Treatment delayed due to his stroke on 04/18/21. S/p 8 cycles neoadjuvant Keytruda 05/11/21 - 09/28/21. He tolerated well with mild fatigue.  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 -He began FOLFOX on 11/24/21. He has tolerated well overall with some taste changes and mild fatigue -restaging CT scan 02/20/2022 showed PR -restaging CT from 05/15/2022 showed improved masslike enlargement of the  duodenal as well as fold nodularity and thickening along the stomach, however it showed a new liver lesion, indeterminate, will obtain liver MRI  -Patient complained of worsening fatigue, last cycle chemo was held on 05/18/22. -Liver MRI yesterday showed benign liver lesions and a cystic lesion in pancreas, no evidence of liver metastasis.  I reviewed and discussed the findings with patient and his wife. -Due to his neuropathy and fatigue, I will change his treatment to maintenance Xeloda.  Will slightly reduce the dose for first cycle, I called in today, he will start next week.  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   PLAN: - I DISCUSS MRI findings  -f/u in 6 months with a repeat LIVER MRI - recommend OTC B COMPLEX -I change chemo regiment to XELODA, 2 wks on and 1 wk off, slightly low dose for first cycle -I discuss Xeloda and its side effects -I recommend the pt to start The Xeloda on next Monday 3 tablets in the morning and 4 tablets at night the first week. 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 Simple  appearing right renal cyst. 2. Otherwise negative abdominal ultrasound   03/18/2021 Procedure   Upper Endoscopy/Colonoscopy, Dr. Loletha Carrow  Upper Endoscopy Impression: - Normal esophagus. - A single submucosal papule (nodule) found in the stomach. - Duodenal mass. Biopsied.  Concerning for malignancy.  Colonoscopy Impression: - Diverticulosis in the entire examined colon. - The examined portion of the ileum was normal. - Patent end-to-end colo-colonic anastomosis, characterized by healthy appearing mucosa. - Internal hemorrhoids. - No specimens collected.   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 Circumferential wall thickening of the first portion of the duodenum, in keeping with known primary malignancy. 2. Enlarged, hypodense lymph nodes anterior to the pancreatic head and in the portacaval station, concerning for nodal metastatic disease. 3. No other evidence of metastatic disease in the chest, abdomen, or pelvis. 4. Incidental note of a fluid attenuation lesion within the proximal pancreatic body measuring 1.9 x 1.4 cm, with prominence of the pancreatic duct distally, up to 0.6 cm. This lesion was present on remote prior examination dated 05/04/2009, and has slightly increased in size over a long period of time. This is consistent with a small IPMN and benign given indolent behavior over greater than 10 years. 5. Background of very fine centrilobular pulmonary nodules, most concentrated in the lung apices, most commonly seen in smoking-related respiratory bronchiolitis. 6. Coronary artery disease.   Aortic Atherosclerosis (ICD10-I70.0).    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 Of  note, a variant of uncertain significance was detected in the ATM (p.A869G), BLM (c.800-3T>G), and SDHA (p.V632F) genes. Report date is 04/27/2021.  The CancerNext-Expanded gene panel offered by [facility] and includes sequencing, rearrangement, and RNA analysis for the following 77 genes: AIP, ALK, APC, ATM, AXIN2, BAP1, BARD1, BLM, BMPR1A, BRCA1, BRCA2, BRIP1, CDC73, CDH1, CDK4, CDKN1B, CDKN2A, CHEK2, CTNNA1, DICER1, FANCC, FH, FLCN, GALNT12, KIF1B, LZTR1, MAX, MEN1, MET, MLH1, MSH2, MSH3, MSH6, MUTYH, NBN, NF1, NF2, NTHL1, PALB2, PHOX2B, PMS2, POT1, PRKAR1A, PTCH1, PTEN, RAD51C, RAD51D, RB1, RECQL, RET, SDHA, SDHAF2, SDHB, SDHC, SDHD, SMAD4, SMARCA4, SMARCB1, SMARCE1, STK11, SUFU, TMEM127, TP53, TSC1, TSC2, VHL and XRCC2 (sequencing and deletion/duplication); EGFR, EGLN1, HOXB13, KIT, MITF, PDGFRA, POLD1, and POLE (sequencing only); EPCAM and GREM1 (deletion/duplication only).    05/11/2021 - 09/28/2021 Chemotherapy   Patient is on Treatment Plan : COLORECTAL Pembrolizumab (200) q21d     11/24/2021 -  Chemotherapy   Patient is on Treatment Plan : COLORECTAL FOLFOX q14d x 4 months      Imaging     02/20/2022 Imaging    IMPRESSION: 1. Distal stomach/duodenal mass is decreased in size when compared with prior exam. 2. Stable upper abdominal adenopathy. 3. 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 4. Aortic Atherosclerosis (ICD10-I70.0).      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 He was last seen by  me on 05/18/2022. Pt reports of having numbness and tingling in his hands feet. Pt reports of having difficulty picking up objects.   All other systems were reviewed with the patient and are negative.  MEDICAL HISTORY:  Past Medical History:  Diagnosis Date   Anemia 10/31/2021   Dx IDA   Carotid stenosis, left     Colon cancer ([place]) 2000   Coronary artery disease    per Dr. Jacalyn Lefevre note from 04/13/21   Diverticulosis of colon    Duodenal cancer ([place]) 01/2021   GERD (gastroesophageal reflux disease)    Glaucoma    History of chemotherapy 2000   colon cancer   History of radiation therapy 08/06/13- 10/03/13   prostate 7800 cGy 40 sessions, seminal vesicles 5600 cGy 40 sessions   Hyperlipidemia    Hypertension    Left knee DJD    Prostate cancer ([place]) 05/23/2013   gleason 3+4=7, volume 11.5 ml   Stroke ([place]) 04/18/2021   TIA (transient ischemic attack)     SURGICAL HISTORY: Past Surgical History:  Procedure Laterality Date   CHOLECYSTECTOMY N/A 10/19/2021   Procedure: CHOLECYSTECTOMY;  Surgeon: [name], MD;  Location: [place];  Service: General;  Laterality: N/A;   COLON SURGERY  2000   colon cancer   COLONOSCOPY     ENDARTERECTOMY Left 04/25/2021   Procedure: LEFT ENDARTERECTOMY CAROTID;  Surgeon: Marty Heck, MD;  Location: [place];  Service: Vascular;  Laterality: Left;   EUS     pancreatic cyst   KNEE ARTHROSCOPY  2007   LT- [facility]   LAPAROSCOPY N/A 10/19/2021   Procedure: LAPAROSCOPY DIAGNOSTIC;  Surgeon: [name], MD;  Location: [place];  Service: General;  Laterality: N/A;  GENERAL AND TAP BLOCK   PATCH ANGIOPLASTY Left 04/25/2021   Procedure: PATCH ANGIOPLASTY XENOSURE 1CM X 6CM;  Surgeon: Marty Heck, MD;  Location: [place];  Service: Vascular;  Laterality: Left;   PORTACATH PLACEMENT Left 11/17/2021   Procedure: PORT PLACEMENT;  Surgeon: [name], MD;  Location: [place];  Service: General;  Laterality: Left;   PROSTATE BIOPSY  05/23/13   gleason 7, volume 11.5 ml   WHIPPLE PROCEDURE N/A 10/19/2021   Procedure: ATTEMPTED WHIPPLE PROCEDURE  WITH GASTRODUODENAL BYPASS;  Surgeon: [name], MD;  Location: [place];  Service: General;  Laterality: N/A;  GENERAL AND TAP BLOCK    I have reviewed the social history and family history with the patient and they are  unchanged from previous note.  ALLERGIES:  is allergic to lyrica [pregabalin].  MEDICATIONS:  Current Outpatient Medications  Medication Sig Dispense Refill   capecitabine (XELODA) 500 MG tablet Take 3 tabs in morning, and 4 tabs in evening, every 10-12 hours, take 30 minutes after meals, take for 14 days then off for 7 days 98 tablet 0   amLODipine (NORVASC) 10 MG tablet Take 1 tablet by mouth once daily 90 tablet 3   aspirin EC 81 MG EC tablet Take 1 tablet (81 mg total) by mouth daily. Swallow whole. 30 tablet 11   atorvastatin (LIPITOR) 40 MG tablet Take 1 tablet by mouth once daily 90 tablet 2   benazepril (LOTENSIN) 40 MG tablet Take 1 tablet by mouth once daily 90 tablet 3   carvedilol (COREG) 12.5 MG tablet Take 12.5 mg by mouth 2 (two) times daily.     iron polysaccharides (FERREX 150) 150 MG capsule Take 1 capsule by mouth twice daily 180 capsule 1   lidocaine-prilocaine (EMLA)  cream Apply to affected area once 30 g 3   pantoprazole (PROTONIX) 40 MG tablet Take 1 tablet by mouth once daily 90 tablet 0   potassium chloride 20 MEQ TBCR Take 20 mEq by mouth 3 (three) times daily. 90 tablet 0   potassium chloride SA (KLOR-CON M20) 20 MEQ tablet Take 1 tablet by mouth 3 (three) times daily.     prochlorperazine (COMPAZINE) 10 MG tablet Take 1 tablet (10 mg total) by mouth every 6 (six) hours as needed for nausea or vomiting (Use for nausea and / or vomiting unresolved with ondansetron (Zofran).). 30 tablet 0   traMADol (ULTRAM) 50 MG tablet Take 1 tablet (50 mg total) by mouth every 12 (twelve) hours as needed. 60 tablet 2   Travoprost, BAK Free, (TRAVATAN) 0.004 % SOLN ophthalmic solution Place 1 drop into both eyes at bedtime.     VITAMIN D PO Take 2 tablets by mouth daily. Gummies     No current facility-administered medications for this visit.    PHYSICAL EXAMINATION: ECOG PERFORMANCE STATUS: 1 - Symptomatic but completely ambulatory  There were no vitals filed for this  visit. Wt Readings from Last 3 Encounters:  05/22/22 200 lb (90.7 kg)  05/18/22 200 lb 4.8 oz (90.9 kg)  05/16/22 196 lb (88.9 kg)     No vitals taken today, Exam not performed today  LABORATORY DATA:  I have reviewed the data as listed    Latest Ref Rng & Units 05/18/2022   11:34 AM 05/03/2022    8:39 AM 04/20/2022   11:04 AM  CBC  WBC 4.0 - 10.5 K/uL 4.7  5.2  4.7   Hemoglobin 13.0 - 17.0 g/dL 11.3  11.6  11.2   Hematocrit 39.0 - 52.0 % 34.8  35.5  34.2   Platelets 150 - 400 K/uL 130  137  120         Latest Ref Rng & Units 05/18/2022   11:34 AM 05/03/2022    8:39 AM 04/20/2022   11:04 AM  CMP  Glucose 70 - 99 mg/dL 79  139  83   BUN 8 - 23 mg/dL 14  15  19    Creatinine 0.61 - 1.24 mg/dL 0.85  0.92  0.87   Sodium 135 - 145 mmol/L 143  143  139   Potassium 3.5 - 5.1 mmol/L 3.8  3.6  3.7   Chloride 98 - 111 mmol/L 110  111  108   CO2 22 - 32 mmol/L 29  28  27    Calcium 8.9 - 10.3 mg/dL 8.3  8.8  8.7   Total Protein 6.5 - 8.1 g/dL 5.5  5.9  5.9   Total Bilirubin 0.3 - 1.2 mg/dL 0.9  1.2  1.2   Alkaline Phos 38 - 126 U/L 94  102  97   AST 15 - 41 U/L 30  24  25    ALT 0 - 44 U/L 27  22  23        RADIOGRAPHIC STUDIES: I have personally reviewed the radiological images as listed and agreed with the findings in the report. MR Abdomen W Wo Contrast  Result Date: 06/06/2022 CLINICAL DATA:  Possible new liver lesions on CT. History of duodenal adenocarcinoma. EXAM: MRI ABDOMEN WITHOUT AND WITH CONTRAST TECHNIQUE: Multiplanar multisequence MR imaging of the abdomen was performed both before and after the administration of intravenous contrast. Contrast was administered via a port which was accessed by a registered nurse. CONTRAST:  93mL EOVIST GADOXETATE DISODIUM  0.25 MOL/L IV SOLN COMPARISON:  Abdominal CT 05/15/2022 and 03/02/2022. MRI 07/04/2010. FINDINGS: Lower chest: No significant findings are seen in the visualized lower chest. Hepatobiliary: No morphologic changes of cirrhosis  or significant steatosis demonstrated. There are scattered punctate foci of T2 hyperintensity throughout the liver which are not associated with any restricted diffusion or suspicious enhancement, although some of these lesions are not well seen on the postcontrast images. No highly suspicious liver lesions are identified. Stable mild extrahepatic biliary dilatation status post cholecystectomy. No evidence of choledocholithiasis. Pancreas: Cystic lesion within the pancreatic body measures 1.5 cm on image 21/14 and likely communicates with the main pancreatic duct which is mildly dilated within the pancreatic tail. This lesion is unchanged in size from recent CT, although has slowly enlarged from older prior examinations. There is no pancreatic ductal dilatation in the pancreatic head. No enhancing pancreatic lesions are identified. Spleen: Normal in size without focal abnormality. Adrenals/Urinary Tract: Both adrenal glands appear normal. No evidence of hydronephrosis or suspicious renal lesions. Grossly stable renal cysts for which no follow-up imaging is recommended. Stomach/Bowel: Unchanged residual irregular thickening of the walls of the distal stomach and proximal duodenum. No evidence of bowel obstruction or new focal bowel lesion. Vascular/Lymphatic: There are no enlarged abdominal lymph nodes. Small lymph nodes in the porta hepatis appear unchanged. No acute vascular findings are identified. Aortic and branch vessel atherosclerosis, better seen on CT. No evidence of large vessel occlusion. Other: Postsurgical changes in the anterior abdominal wall. No ascites or suspicious peritoneal nodularity identified. Susceptibility artifact in the left upper quadrant of the abdomen related to multiple surgical clips. Musculoskeletal: No acute or significant osseous findings. Mild spondylosis. IMPRESSION: 1. No definite evidence of hepatic metastatic disease. Scattered punctate foci of T2 hyperintensity throughout the  liver are not associated with any suspicious enhancement or restricted diffusion and are favored to reflect small cysts or biliary hamartomas. Some of these are suboptimally characterized based on their small size, and given nonvisualization on older prior studies, attention on follow-up CT in 3-6 months recommended. 2. Unchanged residual irregular thickening of the walls of the distal stomach and proximal duodenum. 3. 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 This lesion is unchanged in size from recent CT, although has slowly enlarged from older prior examinations. Recommend follow-up abdominal MRI in 1 year. 4. Stable mild extrahepatic biliary dilatation status post cholecystectomy. 5. Aortic and branch vessel atherosclerosis, better seen on CT. Electronically Signed   By: Richardean Sale M.D.   On: 06/06/2022 08:47      No orders of the defined types were placed in this encounter.  All questions were answered. The patient knows to call the clinic with any problems, questions or concerns. No barriers to learning was detected. The total time spent in the appointment was 22 minutes.     [name], MD 06/06/2022   Felicity Coyer am acting as scribe for [name], MD.   I have reviewed the above documentation for accuracy and completeness, and I agree with the above.

## 2022-06-06 ENCOUNTER — Encounter: Payer: Self-pay | Admitting: Hematology

## 2022-06-06 ENCOUNTER — Encounter: Payer: Self-pay | Admitting: Nurse Practitioner

## 2022-06-06 ENCOUNTER — Telehealth: Payer: Self-pay

## 2022-06-06 ENCOUNTER — Inpatient Hospital Stay: Payer: Medicare Other | Attending: Nurse Practitioner | Admitting: Hematology

## 2022-06-06 ENCOUNTER — Other Ambulatory Visit (HOSPITAL_COMMUNITY): Payer: Self-pay

## 2022-06-06 ENCOUNTER — Telehealth: Payer: Self-pay | Admitting: Pharmacy Technician

## 2022-06-06 ENCOUNTER — Other Ambulatory Visit: Payer: Self-pay

## 2022-06-06 DIAGNOSIS — C170 Malignant neoplasm of duodenum: Secondary | ICD-10-CM

## 2022-06-06 MED ORDER — CAPECITABINE 500 MG PO TABS
ORAL_TABLET | ORAL | 0 refills | Status: DC
  Filled 2022-06-06: qty 98, 21d supply, fill #0

## 2022-06-06 MED ORDER — CAPECITABINE 500 MG PO TABS
ORAL_TABLET | ORAL | 0 refills | Status: DC
  Filled 2022-06-06: qty 98, fill #0

## 2022-06-06 NOTE — Telephone Encounter (Signed)
Oral Oncology Student Pharmacist Encounter  Received new prescription for Xeloda for the treatment of Adenocarcinoma of duodenum, planned duration until disease progression or unacceptable toxicity.  Labs from 05/18/2022 assessed, no interventions needed.  Pt's calculated CrCL 92 mL/min.  Prescription dose and frequency assessed for appropriateness.  Current medication list in Epic reviewed, DDIs with Xeloda identified: pantoprazole (pantoprazole decreases effects of capecitabine; last dispensed: 90-day supply on 05/19/2022)  Evaluated chart and no patient barriers to medication adherence noted.   Patient agreement for treatment documented in MD note on 06/06/2022.  Prescription has been e-scribed to the [facility] for benefits analysis and approval.  Oral Oncology Clinic will continue to follow for insurance authorization, copayment issues, initial counseling and start date.  Aaron Moss, PharmD Candidate 06/06/2022 10:44 AM Oral Oncology Clinic [phone]

## 2022-06-06 NOTE — Telephone Encounter (Signed)
Oral Chemotherapy Student Pharmacist Encounter  I spoke with patient for overview of: Xeloda (capecitabine) for the treatment of Adenocarcinoma of duodenum, planned duration until disease progression or unacceptable drug toxicity.  Counseled patient on administration, dosing, side effects, monitoring, drug-food interactions, safe handling, storage, and disposal.  Patient to switch from pantoprazole to famotidine as pantoprazole decreases effects of Xeloda.  Patient will take Xeloda 500 mg tablets, 3 tablets (1500 mg) by mouth in AM and 4 tabs (2000 mg) by mouth in PM, within 30 minutes of finishing meals, for 14 days on, 7 days off, repeated every 21 days.  Xeloda start date: 06/12/2022  Adverse effects include but are not limited to: fatigue, decreased blood counts, GI upset, diarrhea, mouth sores, and hand-foot syndrome.  Patient has anti-emetic on hand and knows to take it if nausea develops.   Patient will obtain anti diarrheal and alert the office of 4 or more loose stools above baseline.  Reviewed with patient importance of keeping a medication schedule and plan for any missed doses. No barriers to medication adherence identified.  Medication reconciliation performed and medication/allergy list updated.  This will ship from the [facility] on 06/07/2022 to deliver to patient's home on 06/08/2022.  Patient informed the pharmacy will reach out 5-7 days prior to needing next fill of Xeloda to coordinate continued medication acquisition to prevent break in therapy.  All questions answered.  Aaron Moss voiced understanding and appreciation.   Medication education handout placed in mail for patient. Patient knows to call the office with questions or concerns. Oral Chemotherapy Clinic phone number provided to patient.   Donney Rankins, PharmD Candidate 06/06/2022 12:15PM Oral Oncology Clinic [phone]

## 2022-06-06 NOTE — Telephone Encounter (Signed)
Oral Oncology Patient Advocate Encounter  After completing a benefits investigation, prior authorization for Capecitabine is not required at this time through [facility] Medicare D.  Patient's copay is $12.88.     Lady Deutscher, CPhT-Adv Oncology Pharmacy Patient [place] Direct Number: [phone]  Fax: [phone]

## 2022-06-07 ENCOUNTER — Other Ambulatory Visit: Payer: Self-pay

## 2022-06-07 ENCOUNTER — Other Ambulatory Visit (HOSPITAL_COMMUNITY): Payer: Self-pay

## 2022-06-07 NOTE — Telephone Encounter (Signed)
Oral Oncology Pharmacist Encounter   Patient counseled by student in my presence regarding pantoprazole medication. Patient states that he will not take the pantoprazole while he is on the Xeloda due to interaction and will try famotidine for any acid reflex.   Drema Halon, PharmD Hematology/Oncology Clinical Pharmacist [facility] [phone]

## 2022-06-08 ENCOUNTER — Other Ambulatory Visit (HOSPITAL_COMMUNITY): Payer: Self-pay

## 2022-06-09 ENCOUNTER — Inpatient Hospital Stay: Admission: RE | Admit: 2022-06-09 | Payer: Medicare Other | Source: Ambulatory Visit

## 2022-06-20 ENCOUNTER — Other Ambulatory Visit: Payer: Self-pay

## 2022-06-20 ENCOUNTER — Other Ambulatory Visit (HOSPITAL_COMMUNITY): Payer: Self-pay

## 2022-06-20 NOTE — Progress Notes (Unsigned)
Aaron Moss   Telephone:([phone]) [phone] Fax:([phone]) [phone]   Clinic Follow up Note   Patient Care Team: Biagio Borg, MD as PCP - General Stanford Breed, Denice Bors, MD as PCP - Cardiology (Cardiology) [name], MD as Consulting Physician (Gastroenterology) [name], MD as Consulting Physician (Orthopedic Surgery) [name], MD as Consulting Physician (Ophthalmology) [name], MD as Consulting Physician (Oncology)  Date of Service:  06/21/2022  I connected with Aaron Moss on 06/21/2022 at  1:20 PM EDT by telephone visit and verified that I am speaking with the correct person using two identifiers.  I discussed the limitations, risks, security and privacy concerns of performing an evaluation and management service by telephone and the availability of in person appointments. I also discussed with the patient that there may be a patient responsible charge related to this service. The patient expressed understanding and agreed to proceed.   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 3 tablets in the Morning and 4 tablets in the evening   ASSESSMENT & PLAN:  Aaron Moss is a 77 y.o. 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 Treatment delayed due to his stroke on 04/18/21. S/p 8 cycles neoadjuvant Keytruda 05/11/21 - 09/28/21. He tolerated well with mild fatigue.  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 -He began FOLFOX on 11/24/21. He has tolerated well overall with some taste changes and mild fatigue -restaging CT scan 02/20/2022 showed PR -restaging CT from 05/15/2022 showed improved masslike enlargement of  the duodenal as well as fold nodularity and thickening along the stomach, however it showed a new liver lesion, indeterminate, will obtain liver MRI  -Patient complained of worsening fatigue, last cycle chemo was held on 05/18/22. -Liver MRI in March 2024 showed benign liver lesions and a cystic lesion in pancreas, no evidence of liver metastasis.   -Due to his neuropathy and fatigue, I changed his treatment to maintenance Xeloda.    PMS2-related Lynch syndrome (HNPCC4) -he has a personal history of stage pT3N0 colon cancer in 2000 and prostate cancer dx in 2015, s/p 8 weeks of IMRT with Dr. Valere Dross. -genetic testing and counseling on 04/07/21 confirmed a PMS2 mutation, as well as VUS in ATM, BLM, and SDHA.  Peripheral neuropathy secondary to chemotherapy -Secondary to oxaliplatin which I have stopped in February 2024 -Patient reports worsening numbness in his fingers and toes today, I will slightly reduce Xeloda dose to see if it gets better.  PLAN: -reduce Xeloda dose to 3 tablets in the morning and 3 tablets in the evening due to increase numbness in hands and feet.  -I refill Xeloda for next cycle  -lab and f/u on 06/27/2022     SUMMARY OF ONCOLOGIC HISTORY: Oncology History Overview Note   Cancer Staging  Adenocarcinoma of duodenum [facility]) Staging form: Small Intestine - Adenocarcinoma, AJCC 8th Edition - Clinical stage from 03/18/2021: Stage IIIA (cTX, cN1, cM0) - Signed by [name], MD on 06/22/2021 Stage prefix: Initial diagnosis     Adenocarcinoma of duodenum  03/01/2021 Imaging   EXAM: ABDOMEN ULTRASOUND COMPLETE  IMPRESSION: 1. Simple appearing right renal cyst. 2. Otherwise negative abdominal ultrasound  03/18/2021 Procedure   Upper Endoscopy/Colonoscopy, Dr. Loletha Carrow  Upper Endoscopy Impression: - Normal esophagus. - A single submucosal papule (nodule) found in the stomach. - Duodenal mass. Biopsied. Concerning for malignancy.  Colonoscopy Impression: -  Diverticulosis in the entire examined colon. - The examined portion of the ileum was normal. - Patent end-to-end colo-colonic anastomosis, characterized by healthy appearing mucosa. - Internal hemorrhoids. - No specimens collected.   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 Circumferential wall thickening of the first portion of the duodenum, in keeping with known primary malignancy. 2. Enlarged, hypodense lymph nodes anterior to the pancreatic head and in the portacaval station, concerning for nodal metastatic disease. 3. No other evidence of metastatic disease in the chest, abdomen, or pelvis. 4. Incidental note of a fluid attenuation lesion within the proximal pancreatic body measuring 1.9 x 1.4 cm, with prominence of the pancreatic duct distally, up to 0.6 cm. This lesion was present on remote prior examination dated 05/04/2009, and has slightly increased in size over a long period of time. This is consistent with a small IPMN and benign given indolent behavior over greater than 10 years. 5. Background of very fine centrilobular pulmonary nodules, most concentrated in the lung apices, most commonly seen in smoking-related respiratory bronchiolitis. 6. Coronary artery disease.   Aortic Atherosclerosis (ICD10-I70.0).    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 Of note, a variant of uncertain significance was detected in  the ATM (p.A869G), BLM (c.800-3T>G), and SDHA (p.V632F) genes. Report date is 04/27/2021.  The CancerNext-Expanded gene panel offered by [facility] and includes sequencing, rearrangement, and RNA analysis for the following 77 genes: AIP, ALK, APC, ATM, AXIN2, BAP1, BARD1, BLM, BMPR1A, BRCA1, BRCA2, BRIP1, CDC73, CDH1, CDK4, CDKN1B, CDKN2A, CHEK2, CTNNA1, DICER1, FANCC, FH, FLCN, GALNT12, KIF1B, LZTR1, MAX, MEN1, MET, MLH1, MSH2, MSH3, MSH6, MUTYH, NBN, NF1, NF2, NTHL1, PALB2, PHOX2B, PMS2, POT1, PRKAR1A, PTCH1, PTEN, RAD51C, RAD51D, RB1, RECQL, RET, SDHA, SDHAF2, SDHB, SDHC, SDHD, SMAD4, SMARCA4, SMARCB1, SMARCE1, STK11, SUFU, TMEM127, TP53, TSC1, TSC2, VHL and XRCC2 (sequencing and deletion/duplication); EGFR, EGLN1, HOXB13, KIT, MITF, PDGFRA, POLD1, and POLE (sequencing only); EPCAM and GREM1 (deletion/duplication only).    05/11/2021 - 09/28/2021 Chemotherapy   Patient is on Treatment Plan : COLORECTAL Pembrolizumab (200) q21d     11/24/2021 -  Chemotherapy   Patient is on Treatment Plan : COLORECTAL FOLFOX q14d x 4 months     02/20/2022 Imaging    IMPRESSION: 1. Distal stomach/duodenal mass is decreased in size when compared with prior exam. 2. Stable upper abdominal adenopathy. 3. 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 4. Aortic Atherosclerosis (ICD10-I70.0).   05/15/2022 Imaging    IMPRESSION: Improved masslike enlargement of the duodenal as well as fold nodularity and thickening along the stomach. In addition there are some small nodes identified in the upper abdomen which are similar overall compared to the study of  December 2023   However there is a new spiculated nodule in the middle lobe which is worrisome. In addition there are some small low-attenuation liver lesions which are too small to completely characterize but were not clearly seen on the previous  examination. Recommend further evaluation. For the liver lesions in particular a Eovist liver MRI may be of some benefit as clinically directed.   Persistent cystic lesion along the midbody of the pancreas with associated duct dilatation. A cystic neoplasm of the pancreas is possible.   06/06/2022 Imaging   MR Abdomen W Wo Contrast     IMPRESSION: 1. No definite evidence of hepatic metastatic disease. Scattered punctate foci of T2 hyperintensity throughout the liver are not associated with any suspicious enhancement or restricted diffusion and are favored to reflect small cysts or biliary hamartomas. Some of these are suboptimally characterized based on their small size, and given nonvisualization on older prior studies, attention on follow-up CT in 3-6 months recommended. 2. Unchanged residual irregular thickening of the walls of the distal stomach and proximal duodenum. 3. 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 This lesion is unchanged in size from recent CT, although has slowly enlarged from older prior examinations. Recommend follow-up abdominal MRI in 1 year. 4. Stable mild extrahepatic biliary dilatation status post cholecystectomy. 5. Aortic and branch vessel atherosclerosis, better seen on CT.      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 He was last seen by me on 06/06/2022. Pt started the Xeloda last Monday. Pt report of numbness in hands and feet . Pt state its hard to take tops off jar. Pt state that  his feet feel swollen. Pt state that the numbness is worse then the tingling feeling.Pt state that he has a little more energy but gets tired  easily after doing activities.   All other systems were reviewed with the patient and are negative.  MEDICAL HISTORY:  Past Medical History:  Diagnosis Date   Anemia 10/31/2021   Dx IDA    Carotid stenosis, left    Colon cancer 2000   Coronary artery disease    per Dr. Jacalyn Lefevre note from 04/13/21   Diverticulosis of colon    Duodenal cancer 01/2021   GERD (gastroesophageal reflux disease)    Glaucoma    History of chemotherapy 2000   colon cancer   History of radiation therapy 08/06/13- 10/03/13   prostate 7800 cGy 40 sessions, seminal vesicles 5600 cGy 40 sessions   Hyperlipidemia    Hypertension    Left knee DJD    Prostate cancer 05/23/2013   gleason 3+4=7, volume 11.5 ml   Stroke 04/18/2021   TIA (transient ischemic attack)     SURGICAL HISTORY: Past Surgical History:  Procedure Laterality Date   CHOLECYSTECTOMY N/A 10/19/2021   Procedure: CHOLECYSTECTOMY;  Surgeon: [name], MD;  Location: [place];  Service: General;  Laterality: N/A;   COLON SURGERY  2000   colon cancer   COLONOSCOPY     ENDARTERECTOMY Left 04/25/2021   Procedure: LEFT ENDARTERECTOMY CAROTID;  Surgeon: Marty Heck, MD;  Location: [place];  Service: Vascular;  Laterality: Left;   EUS     pancreatic cyst   KNEE ARTHROSCOPY  2007   LT- [facility]   LAPAROSCOPY N/A 10/19/2021   Procedure: LAPAROSCOPY DIAGNOSTIC;  Surgeon: [name], MD;  Location: [place];  Service: General;  Laterality: N/A;  GENERAL AND TAP BLOCK   PATCH ANGIOPLASTY Left 04/25/2021   Procedure: PATCH ANGIOPLASTY XENOSURE 1CM X 6CM;  Surgeon: Marty Heck, MD;  Location: [place];  Service: Vascular;  Laterality: Left;   PORTACATH PLACEMENT Left 11/17/2021   Procedure: PORT PLACEMENT;  Surgeon: [name], MD;  Location: [place];  Service: General;  Laterality: Left;   PROSTATE BIOPSY  05/23/13   gleason 7, volume 11.5 ml   WHIPPLE PROCEDURE N/A 10/19/2021   Procedure: ATTEMPTED WHIPPLE PROCEDURE  WITH GASTRODUODENAL BYPASS;  Surgeon: [name], MD;  Location: [place];  Service: General;  Laterality: N/A;  GENERAL AND TAP BLOCK    I have reviewed the social history and family history with the patient and they are  unchanged from previous note.  ALLERGIES:  is allergic to lyrica [pregabalin].  MEDICATIONS:  Current Outpatient Medications  Medication Sig Dispense Refill   amLODipine (NORVASC) 10 MG tablet Take 1 tablet by mouth once daily 90 tablet 3   aspirin EC 81 MG EC tablet Take 1 tablet (81 mg total) by mouth daily. Swallow whole. 30 tablet 11   atorvastatin (LIPITOR) 40 MG tablet Take 1 tablet by mouth once daily 90 tablet 2   benazepril (LOTENSIN) 40 MG tablet Take 1 tablet by mouth once daily 90 tablet 3   capecitabine (XELODA) 500 MG tablet Take 3 tablets (1,500 mg total) by mouth 2 (two) times daily after a meal. Take within 30 mins of a meal. Take for 14 days then off for 7 days. Repeat ever 21 days. 84 tablet 1   carvedilol (COREG) 12.5 MG tablet Take 12.5 mg by mouth 2 (two) times daily.     iron polysaccharides (FERREX 150) 150 MG capsule Take 1 capsule by mouth twice daily 180 capsule 1   lidocaine-prilocaine (EMLA) cream Apply to affected area once 30 g 3   pantoprazole (PROTONIX) 40 MG tablet Take 1 tablet by mouth once daily (Patient not taking: Reported on 06/07/2022) 90 tablet 0   potassium chloride 20 MEQ TBCR Take 20 mEq by mouth 3 (three) times daily. 90 tablet 0   potassium chloride SA (KLOR-CON M20) 20 MEQ tablet Take 1 tablet by mouth 3 (three) times daily.     prochlorperazine (COMPAZINE) 10 MG tablet Take 1 tablet (10 mg total) by mouth every 6 (six) hours as needed for nausea or vomiting (Use for nausea and / or vomiting unresolved with ondansetron (Zofran).). 30 tablet 0   traMADol (ULTRAM) 50 MG tablet Take 1 tablet (50 mg total) by mouth every 12 (twelve) hours as needed. 60 tablet 2   Travoprost, BAK Free, (TRAVATAN) 0.004 % SOLN ophthalmic solution Place 1 drop into both eyes at bedtime.     VITAMIN D PO Take 2 tablets by mouth daily. Gummies     No current facility-administered medications for this visit.    PHYSICAL EXAMINATION: ECOG PERFORMANCE STATUS: 2 -  Symptomatic, <50% confined to bed  There were no vitals filed for this visit. Wt Readings from Last 3 Encounters:  05/22/22 200 lb (90.7 kg)  05/18/22 200 lb 4.8 oz (90.9 kg)  05/16/22 196 lb (88.9 kg)     No vitals taken today, Exam not performed today  LABORATORY DATA:  I have reviewed the data as listed    Latest Ref Rng & Units 05/18/2022   11:34 AM 05/03/2022    8:39 AM 04/20/2022   11:04 AM  CBC  WBC 4.0 - 10.5 K/uL 4.7  5.2  4.7   Hemoglobin 13.0 - 17.0 g/dL 11.3  11.6  11.2   Hematocrit 39.0 - 52.0 % 34.8  35.5  34.2   Platelets 150 - 400 K/uL 130  137  120         Latest Ref Rng & Units 05/18/2022   11:34 AM 05/03/2022    8:39 AM 04/20/2022   11:04 AM  CMP  Glucose 70 - 99 mg/dL 79  139  83   BUN 8 - 23 mg/dL 14  15  19    Creatinine 0.61 - 1.24 mg/dL 0.85  0.92  0.87   Sodium 135 - 145 mmol/L 143  143  139   Potassium 3.5 - 5.1 mmol/L 3.8  3.6  3.7   Chloride 98 - 111 mmol/L 110  111  108   CO2 22 - 32 mmol/L 29  28  27    Calcium 8.9 - 10.3 mg/dL 8.3  8.8  8.7   Total Protein 6.5 - 8.1 g/dL 5.5  5.9  5.9   Total Bilirubin 0.3 - 1.2 mg/dL 0.9  1.2  1.2   Alkaline Phos 38 - 126 U/L 94  102  97   AST 15 - 41 U/L 30  24  25    ALT 0 - 44 U/L 27  22  23        RADIOGRAPHIC STUDIES: I have personally reviewed the radiological images as listed and agreed with the findings in the report. No results found.    No orders of the defined types were placed in this encounter.  All questions were answered. The patient knows to call the clinic with any problems, questions or concerns. No barriers to learning was detected. The total time spent in the appointment was 15 minutes.     [name], MD 06/21/2022   Felicity Coyer am acting as scribe for [name], MD.   I have reviewed the above documentation for accuracy and completeness, and I agree with the above.

## 2022-06-21 ENCOUNTER — Other Ambulatory Visit (HOSPITAL_COMMUNITY): Payer: Self-pay

## 2022-06-21 ENCOUNTER — Encounter: Payer: Self-pay | Admitting: Hematology

## 2022-06-21 ENCOUNTER — Other Ambulatory Visit: Payer: Self-pay

## 2022-06-21 ENCOUNTER — Inpatient Hospital Stay: Payer: Medicare Other | Attending: Nurse Practitioner | Admitting: Hematology

## 2022-06-21 DIAGNOSIS — Z923 Personal history of irradiation: Secondary | ICD-10-CM | POA: Insufficient documentation

## 2022-06-21 DIAGNOSIS — I10 Essential (primary) hypertension: Secondary | ICD-10-CM | POA: Insufficient documentation

## 2022-06-21 DIAGNOSIS — C170 Malignant neoplasm of duodenum: Secondary | ICD-10-CM | POA: Insufficient documentation

## 2022-06-21 DIAGNOSIS — D508 Other iron deficiency anemias: Secondary | ICD-10-CM | POA: Insufficient documentation

## 2022-06-21 DIAGNOSIS — Z9884 Bariatric surgery status: Secondary | ICD-10-CM | POA: Insufficient documentation

## 2022-06-21 DIAGNOSIS — I2510 Atherosclerotic heart disease of native coronary artery without angina pectoris: Secondary | ICD-10-CM | POA: Insufficient documentation

## 2022-06-21 DIAGNOSIS — G620 Drug-induced polyneuropathy: Secondary | ICD-10-CM | POA: Insufficient documentation

## 2022-06-21 DIAGNOSIS — Z79899 Other long term (current) drug therapy: Secondary | ICD-10-CM | POA: Insufficient documentation

## 2022-06-21 DIAGNOSIS — Z1509 Genetic susceptibility to other malignant neoplasm: Secondary | ICD-10-CM | POA: Diagnosis not present

## 2022-06-21 DIAGNOSIS — Z9221 Personal history of antineoplastic chemotherapy: Secondary | ICD-10-CM | POA: Insufficient documentation

## 2022-06-21 DIAGNOSIS — Z452 Encounter for adjustment and management of vascular access device: Secondary | ICD-10-CM | POA: Insufficient documentation

## 2022-06-21 DIAGNOSIS — Z7982 Long term (current) use of aspirin: Secondary | ICD-10-CM | POA: Insufficient documentation

## 2022-06-21 DIAGNOSIS — Z8546 Personal history of malignant neoplasm of prostate: Secondary | ICD-10-CM | POA: Insufficient documentation

## 2022-06-21 MED ORDER — CAPECITABINE 500 MG PO TABS
1500.0000 mg | ORAL_TABLET | Freq: Two times a day (BID) | ORAL | 1 refills | Status: DC
  Filled 2022-06-21 – 2022-06-26 (×2): qty 84, 14d supply, fill #0
  Filled 2022-07-20: qty 84, 14d supply, fill #1

## 2022-06-21 NOTE — Assessment & Plan Note (Signed)
-  he has a personal history of stage pT3N0 colon cancer in 2000 and prostate cancer dx in 2015, s/p 8 weeks of IMRT with Dr. Murray. -genetic testing and counseling on 04/07/21 confirmed a PMS2 mutation, as well as VUS in ATM, BLM, and SDHA. 

## 2022-06-21 NOTE — Assessment & Plan Note (Signed)
-  diagnosed 02/2021 by endo-/colonoscopy for weight loss and anemia. Treatment delayed due to his stroke on 04/18/21. S/p 8 cycles neoadjuvant Keytruda 05/11/21 - 09/28/21. He tolerated well with mild fatigue.  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 -He began FOLFOX on 11/24/21. He has tolerated well overall with some taste changes and mild fatigue -restaging CT scan 02/20/2022 showed PR -restaging CT from 05/15/2022 showed improved masslike enlargement of the duodenal as well as fold nodularity and thickening along the stomach, however it showed a new liver lesion, indeterminate, will obtain liver MRI  -Patient complained of worsening fatigue, last cycle chemo was held on 05/18/22. -Liver MRI in March 2024 showed benign liver lesions and a cystic lesion in pancreas, no evidence of liver metastasis.   -Due to his neuropathy and fatigue, I changed his treatment to maintenance Xeloda.

## 2022-06-22 ENCOUNTER — Other Ambulatory Visit: Payer: Self-pay

## 2022-06-23 ENCOUNTER — Other Ambulatory Visit (HOSPITAL_COMMUNITY): Payer: Self-pay

## 2022-06-25 ENCOUNTER — Other Ambulatory Visit: Payer: Self-pay | Admitting: Nurse Practitioner

## 2022-06-25 DIAGNOSIS — C170 Malignant neoplasm of duodenum: Secondary | ICD-10-CM

## 2022-06-25 NOTE — Progress Notes (Signed)
Patient Care Team: Corwin Levins, MD as PCP - General Jens Som, Madolyn Frieze, MD as PCP - Cardiology (Cardiology) Iva Boop, MD as Consulting Physician (Gastroenterology) Gean Birchwood, MD as Consulting Physician (Orthopedic Surgery) Sallye Lat, MD as Consulting Physician (Ophthalmology) Malachy Mood, MD as Consulting Physician (Oncology)   CHIEF COMPLAINT: Follow up adenocarcinoma of duodenum  Oncology History Overview Note   Cancer Staging  Adenocarcinoma of duodenum [facility]) Staging form: Small Intestine - Adenocarcinoma, AJCC 8th Edition - Clinical stage from 03/18/2021: Stage IIIA (cTX, cN1, cM0) - Signed by Malachy Mood, MD on 06/22/2021 Stage prefix: Initial diagnosis     Adenocarcinoma of duodenum  03/01/2021 Imaging   EXAM: ABDOMEN ULTRASOUND COMPLETE  IMPRESSION: 1. Simple appearing right renal cyst. 2. Otherwise negative abdominal ultrasound   03/18/2021 Procedure   Upper Endoscopy/Colonoscopy, Dr. Myrtie Neither  Upper Endoscopy Impression: - Normal esophagus. - A single submucosal papule (nodule) found in the stomach. - Duodenal mass. Biopsied. Concerning for malignancy.  Colonoscopy Impression: - Diverticulosis in the entire examined colon. - The examined portion of the ileum was normal. - Patent end-to-end colo-colonic anastomosis, characterized by healthy appearing mucosa. - Internal hemorrhoids. - No specimens collected.   03/18/2021 Initial Biopsy   Diagnosis Duodenum, Biopsy - INVASIVE MODERATE TO POORLY DIFFERENTIATED ADENOCARCINOMA   03/18/2021 Cancer Staging   Staging form: Small Intestine - Adenocarcinoma, AJCC 8th Edition - Clinical stage from 03/18/2021: Stage IIIA (cTX, cN1, cM0) - Signed by Malachy Mood, MD on 06/22/2021 Stage prefix: Initial diagnosis   03/22/2021 Initial Diagnosis   Adenocarcinoma of duodenum (HCC)   04/01/2021 Imaging   EXAM: CT CHEST, ABDOMEN, AND PELVIS WITH CONTRAST  IMPRESSION: 1. Circumferential wall thickening of the  first portion of the duodenum, in keeping with known primary malignancy. 2. Enlarged, hypodense lymph nodes anterior to the pancreatic head and in the portacaval station, concerning for nodal metastatic disease. 3. No other evidence of metastatic disease in the chest, abdomen, or pelvis. 4. Incidental note of a fluid attenuation lesion within the proximal pancreatic body measuring 1.9 x 1.4 cm, with prominence of the pancreatic duct distally, up to 0.6 cm. This lesion was present on remote prior examination dated 05/04/2009, and has slightly increased in size over a long period of time. This is consistent with a small IPMN and benign given indolent behavior over greater than 10 years. 5. Background of very fine centrilobular pulmonary nodules, most concentrated in the lung apices, most commonly seen in smoking-related respiratory bronchiolitis. 6. Coronary artery disease.   Aortic Atherosclerosis (ICD10-I70.0).    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 Of note, a variant of uncertain significance was detected in the ATM (p.A869G), BLM (c.800-3T>G), and SDHA (p.V632F) genes. Report date is 04/27/2021.  The CancerNext-Expanded gene panel offered by [facility] and includes sequencing, rearrangement, and RNA analysis for the following 77 genes: AIP, ALK, APC, ATM, AXIN2, BAP1, BARD1, BLM, BMPR1A, BRCA1, BRCA2, BRIP1, CDC73, CDH1, CDK4, CDKN1B, CDKN2A, CHEK2, CTNNA1, DICER1, FANCC, FH, FLCN, GALNT12, KIF1B, LZTR1, MAX, MEN1, MET, MLH1, MSH2, MSH3, MSH6, MUTYH, NBN, NF1, NF2, NTHL1, PALB2, PHOX2B, PMS2, POT1, PRKAR1A, PTCH1, PTEN, RAD51C, RAD51D, RB1, RECQL, RET, SDHA, SDHAF2, SDHB, SDHC, SDHD, SMAD4, SMARCA4, SMARCB1, SMARCE1, STK11, SUFU, TMEM127, TP53, TSC1, TSC2, VHL and XRCC2 (sequencing and deletion/duplication); EGFR, EGLN1, HOXB13, KIT, MITF, PDGFRA, POLD1, and POLE (sequencing  only); EPCAM and GREM1 (deletion/duplication only).    05/11/2021 - 09/28/2021  Chemotherapy   Patient is on Treatment Plan : COLORECTAL Pembrolizumab (200) q21d     11/24/2021 -  Chemotherapy   Patient is on Treatment Plan : COLORECTAL FOLFOX q14d x 4 months     02/20/2022 Imaging    IMPRESSION: 1. Distal stomach/duodenal mass is decreased in size when compared with prior exam. 2. Stable upper abdominal adenopathy. 3. 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 4. Aortic Atherosclerosis (ICD10-I70.0).   05/15/2022 Imaging    IMPRESSION: Improved masslike enlargement of the duodenal as well as fold nodularity and thickening along the stomach. In addition there are some small nodes identified in the upper abdomen which are similar overall compared to the study of December 2023   However there is a new spiculated nodule in the middle lobe which is worrisome. In addition there are some small low-attenuation liver lesions which are too small to completely characterize but were not clearly seen on the previous examination. Recommend further evaluation. For the liver lesions in particular a Eovist liver MRI may be of some benefit as clinically directed.   Persistent cystic lesion along the midbody of the pancreas with associated duct dilatation. A cystic neoplasm of the pancreas is possible.   06/06/2022 Imaging   MR Abdomen W Wo Contrast     IMPRESSION: 1. No definite evidence of hepatic metastatic disease. Scattered punctate foci of T2 hyperintensity throughout the liver are not associated with any suspicious enhancement or restricted diffusion and are favored to reflect small cysts or biliary hamartomas. Some of these are suboptimally characterized based on their small size, and given nonvisualization on older prior studies, attention on follow-up CT in 3-6 months  recommended. 2. Unchanged residual irregular thickening of the walls of the distal stomach and proximal duodenum. 3. 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 This lesion is unchanged in size from recent CT, although has slowly enlarged from older prior examinations. Recommend follow-up abdominal MRI in 1 year. 4. Stable mild extrahepatic biliary dilatation status post cholecystectomy. 5. Aortic and branch vessel atherosclerosis, better seen on CT.      CURRENT THERAPY: Xeloda 2 weeks on1/week off starting 05/2022   INTERVAL HISTORY Aaron Moss returns for follow up as scheduled. Last seen by Dr. Mosetta Putt 06/21/22. Xeloda was reduced to 1500 mg to complete that cycle due to worsening neuropathy. He completed the cycle 4/7 and is currently on his week off. Neuropathy in fingers is a little better but never goes away. He has intermittent pain in the wrists and forearms. This is present only at night, during the day he is not hurting. Some difficulty with fine motor movements and buttoning/zipping clothes.  Otherwise tolerating well.  Energy and appetite are good, bowels are moving well.  Denies new or worsening pain.     ROS  All other systems reviewed and negative   Past Medical History:  Diagnosis Date   Anemia 10/31/2021   Dx IDA   Carotid stenosis, left    Colon cancer 2000   Coronary artery disease    per Dr. 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, volume 11.5 ml   Stroke 04/18/2021   TIA (transient ischemic attack)      Past Surgical History:   Procedure Laterality Date   CHOLECYSTECTOMY N/A 10/19/2021   Procedure: CHOLECYSTECTOMY;  Surgeon: Almond Lint, MD;  Location: [facility] OR;  Service: General;  Laterality: N/A;   COLON SURGERY  2000   colon cancer   COLONOSCOPY     ENDARTERECTOMY Left 04/25/2021   Procedure: LEFT ENDARTERECTOMY CAROTID;  Surgeon: Cephus Shelling, MD;  Location: [facility] OR;  Service: Vascular;  Laterality: Left;   EUS     pancreatic cyst   KNEE ARTHROSCOPY  2007   LT- [facility]   LAPAROSCOPY N/A 10/19/2021   Procedure: LAPAROSCOPY DIAGNOSTIC;  Surgeon: Almond Lint, MD;  Location: [facility] OR;  Service: General;  Laterality: N/A;  GENERAL AND TAP BLOCK   PATCH ANGIOPLASTY Left 04/25/2021   Procedure: PATCH ANGIOPLASTY XENOSURE 1CM X 6CM;  Surgeon: Cephus Shelling, MD;  Location: [facility] OR;  Service: Vascular;  Laterality: Left;   PORTACATH PLACEMENT Left 11/17/2021   Procedure: PORT PLACEMENT;  Surgeon: Almond Lint, MD;  Location: [facility] OR;  Service: General;  Laterality: Left;   PROSTATE BIOPSY  05/23/13   gleason 7, volume 11.5 ml   WHIPPLE PROCEDURE N/A 10/19/2021   Procedure: ATTEMPTED WHIPPLE PROCEDURE  WITH GASTRODUODENAL BYPASS;  Surgeon: Almond Lint, MD;  Location: [facility] OR;  Service: General;  Laterality: N/A;  GENERAL AND TAP BLOCK     Outpatient Encounter Medications as of 06/27/2022  Medication Sig Note   amLODipine (NORVASC) 10 MG tablet Take 1 tablet by mouth once daily    aspirin EC 81 MG EC tablet Take 1 tablet (81 mg total) by mouth daily. Swallow whole.    atorvastatin (LIPITOR) 40 MG tablet Take 1 tablet by mouth once daily    benazepril (LOTENSIN) 40 MG tablet Take 1 tablet by mouth once daily    capecitabine (XELODA) 500 MG tablet Take 3 tablets (1,500 mg total) by mouth 2 (two) times daily after a meal. Take within 30 mins of a meal. Take for 14 days then off for 7 days. Repeat ever 21 days.    carvedilol (COREG) 12.5 MG tablet Take 12.5 mg by mouth 2 (two) times daily.    iron polysaccharides (FERREX  150) 150 MG capsule Take 1 capsule by mouth twice daily    lidocaine-prilocaine (EMLA) cream Apply to affected area once 11/16/2021: Has not started   pantoprazole (PROTONIX) 40 MG tablet Take 1 tablet by mouth once daily (Patient not taking: Reported on 06/07/2022)    potassium chloride 20 MEQ TBCR Take 20 mEq by mouth 3 (three) times daily.    potassium chloride SA (KLOR-CON M20) 20 MEQ tablet Take 1 tablet by mouth 3 (three) times daily.    prochlorperazine (COMPAZINE) 10 MG tablet Take 1 tablet (10 mg total) by mouth every 6 (six) hours as needed for nausea or vomiting (Use for nausea and / or vomiting unresolved with ondansetron (Zofran).).    traMADol (ULTRAM) 50 MG tablet Take 1 tablet (50 mg total) by mouth every 12 (twelve) hours as needed.    Travoprost, BAK Free, (TRAVATAN) 0.004 % SOLN ophthalmic solution Place 1 drop into both eyes at bedtime.    VITAMIN D PO Take 2 tablets by mouth daily. Gummies    No  facility-administered encounter medications on file as of 06/27/2022.     Today's Vitals   06/27/22 1018  BP: (!) 147/67  Pulse: 70  Resp: 16  Temp: 98.3 F (36.8 C)  TempSrc: Oral  SpO2: 99%  Weight: 204 lb 9.6 oz (92.8 kg)   Body mass index is 33.02 kg/m.   PHYSICAL EXAM GENERAL:alert, no distress and comfortable SKIN: no rash. Palms with mild erythema and hyperpigmentation, no cracks/peeling  EYES: sclera clear LUNGS: clear with normal breathing effort HEART: regular rate & rhythm, no lower extremity edema ABDOMEN: abdomen soft, non-tender and normal bowel sounds NEURO: alert & oriented x 3 with fluent speech, mildly decreased peripheral vibratory sense over the fingertips per tuning fork exam PAC without erythema    CBC    Component Value Date/Time   WBC 4.8 06/27/2022 1000   WBC 8.5 10/31/2021 1211   RBC 3.75 (L) 06/27/2022 1000   HGB 10.6 (L) 06/27/2022 1000   HCT 32.4 (L) 06/27/2022 1000   PLT 174 06/27/2022 1000   MCV 86.4 06/27/2022 1000   MCH 28.3  06/27/2022 1000   MCHC 32.7 06/27/2022 1000   RDW 18.2 (H) 06/27/2022 1000   LYMPHSABS 1.2 06/27/2022 1000   MONOABS 0.4 06/27/2022 1000   EOSABS 0.2 06/27/2022 1000   BASOSABS 0.0 06/27/2022 1000     CMP     Component Value Date/Time   NA 142 06/27/2022 1000   K 3.3 (L) 06/27/2022 1000   CL 109 06/27/2022 1000   CO2 29 06/27/2022 1000   GLUCOSE 126 (H) 06/27/2022 1000   BUN 19 06/27/2022 1000   CREATININE 1.05 06/27/2022 1000   CALCIUM 8.9 06/27/2022 1000   PROT 6.4 (L) 06/27/2022 1000   ALBUMIN 3.3 (L) 06/27/2022 1000   AST 18 06/27/2022 1000   ALT 12 06/27/2022 1000   ALKPHOS 93 06/27/2022 1000   BILITOT 1.6 (H) 06/27/2022 1000   GFRNONAA >60 06/27/2022 1000   GFRAA >60 02/24/2019 0207     ASSESSMENT & PLAN:Aaron Moss is a 77 y.o. 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  -Unfortunately suffered a stroke on 04/18/2021 therefore he did not proceed with upfront surgery  -S/p 8 cycles neoadjuvant Keytruda 05/11/21 - 09/28/21. He tolerated well with mild fatigue.  -attempted Whipple surgery 10/19/21, which was aborted due to obscuring of hepatic artery, s/p gastric bypass. -He began FOLFOX on 11/24/21; restaging CT scan 02/20/2022 showed partial response and 05/15/22 showed improved primary site but new liver lesion -liver MRI in 05/2022 showed benign liver lesions and a cystic lesion in pancreas, no evidence of liver metastasis  -Due to neuropathy and fatigue, he changed to maintenance Xeloda in 05/2022, dose recently decreased to 1500 mg twice daily for 2 weeks on/1 week off -Aaron Moss appears stable.  He completed 1 cycle of maintenance Xeloda, tolerated well except worsening neuropathy.  The dose has been modified.  See #2.  Otherwise tolerated well with no clinical evidence of disease progression -Labs reviewed, CBC is stable.  CMP with K 3.3 (on oral K 3 times daily); increased K rich diet.  T. bili 1.6 with  normal AST/ALT/alk phos likely related to recent Xeloda -Will continue monitoring labs closely.  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  I recommend to start B complex vitamin for nerve health.  We discussed additional medication such as gabapentin, Lyrica, or Cymbalta -He tried Lyrica when he had shingles, made him feel bad, but is open to gabapentin or Cymbalta if symptoms progress -Encouraged him to use other nonpharmacologic remedies such as heat, massage, and hand exercises/as  PMS2-related Lynch syndrome (HNPCC4) -he has a personal history of stage pT3N0 colon cancer in 2000 and prostate cancer dx in 2015, s/p 8 weeks of IMRT with Dr. Dayton Scrape. -genetic testing and counseling on 04/07/21 confirmed a PMS2 mutation, as well as VUS in ATM, BLM, and SDHA.      PLAN: -Labs reviewed -Symptom management for neuropathy, he will begin B complex vitamin -Increase K rich diet, continue oral K 3 times daily -Start cycle 2 Xeloda on 07/03/22, with 1500 mg twice daily for 2 weeks on/1 week off  -Follow-up in 3 weeks, or sooner if needed  No orders of the defined types were placed in this encounter.     All questions were answered. The patient knows to call the clinic with any problems, questions or concerns. No barriers to learning were detected. I spent 20 minutes counseling the patient face to face. The total time spent in the appointment was 30 minutes and more than 50% was on counseling, review of test results, and coordination of care.   Santiago Glad, NP-C 06/27/2022

## 2022-06-26 ENCOUNTER — Other Ambulatory Visit: Payer: Self-pay

## 2022-06-27 ENCOUNTER — Inpatient Hospital Stay (HOSPITAL_BASED_OUTPATIENT_CLINIC_OR_DEPARTMENT_OTHER): Payer: Medicare Other | Admitting: Nurse Practitioner

## 2022-06-27 ENCOUNTER — Telehealth: Payer: Self-pay | Admitting: *Deleted

## 2022-06-27 ENCOUNTER — Encounter: Payer: Self-pay | Admitting: Nurse Practitioner

## 2022-06-27 ENCOUNTER — Inpatient Hospital Stay: Payer: Medicare Other

## 2022-06-27 VITALS — BP 147/67 | HR 70 | Temp 98.3°F | Resp 16 | Wt 204.6 lb

## 2022-06-27 DIAGNOSIS — Z9884 Bariatric surgery status: Secondary | ICD-10-CM | POA: Diagnosis not present

## 2022-06-27 DIAGNOSIS — Z8546 Personal history of malignant neoplasm of prostate: Secondary | ICD-10-CM | POA: Diagnosis not present

## 2022-06-27 DIAGNOSIS — I10 Essential (primary) hypertension: Secondary | ICD-10-CM | POA: Diagnosis not present

## 2022-06-27 DIAGNOSIS — G620 Drug-induced polyneuropathy: Secondary | ICD-10-CM | POA: Diagnosis not present

## 2022-06-27 DIAGNOSIS — Z1509 Genetic susceptibility to other malignant neoplasm: Secondary | ICD-10-CM

## 2022-06-27 DIAGNOSIS — I2510 Atherosclerotic heart disease of native coronary artery without angina pectoris: Secondary | ICD-10-CM | POA: Diagnosis not present

## 2022-06-27 DIAGNOSIS — C170 Malignant neoplasm of duodenum: Secondary | ICD-10-CM | POA: Diagnosis present

## 2022-06-27 DIAGNOSIS — Z79899 Other long term (current) drug therapy: Secondary | ICD-10-CM | POA: Diagnosis not present

## 2022-06-27 DIAGNOSIS — Z7982 Long term (current) use of aspirin: Secondary | ICD-10-CM | POA: Diagnosis not present

## 2022-06-27 DIAGNOSIS — Z9221 Personal history of antineoplastic chemotherapy: Secondary | ICD-10-CM | POA: Diagnosis not present

## 2022-06-27 DIAGNOSIS — Z95828 Presence of other vascular implants and grafts: Secondary | ICD-10-CM

## 2022-06-27 DIAGNOSIS — D500 Iron deficiency anemia secondary to blood loss (chronic): Secondary | ICD-10-CM

## 2022-06-27 DIAGNOSIS — Z923 Personal history of irradiation: Secondary | ICD-10-CM | POA: Diagnosis not present

## 2022-06-27 DIAGNOSIS — D508 Other iron deficiency anemias: Secondary | ICD-10-CM | POA: Diagnosis present

## 2022-06-27 DIAGNOSIS — Z452 Encounter for adjustment and management of vascular access device: Secondary | ICD-10-CM | POA: Diagnosis present

## 2022-06-27 LAB — CBC WITH DIFFERENTIAL (CANCER CENTER ONLY)
Abs Immature Granulocytes: 0.01 10*3/uL (ref 0.00–0.07)
Basophils Absolute: 0 10*3/uL (ref 0.0–0.1)
Basophils Relative: 0 %
Eosinophils Absolute: 0.2 10*3/uL (ref 0.0–0.5)
Eosinophils Relative: 5 %
HCT: 32.4 % — ABNORMAL LOW (ref 39.0–52.0)
Hemoglobin: 10.6 g/dL — ABNORMAL LOW (ref 13.0–17.0)
Immature Granulocytes: 0 %
Lymphocytes Relative: 25 %
Lymphs Abs: 1.2 10*3/uL (ref 0.7–4.0)
MCH: 28.3 pg (ref 26.0–34.0)
MCHC: 32.7 g/dL (ref 30.0–36.0)
MCV: 86.4 fL (ref 80.0–100.0)
Monocytes Absolute: 0.4 10*3/uL (ref 0.1–1.0)
Monocytes Relative: 8 %
Neutro Abs: 3 10*3/uL (ref 1.7–7.7)
Neutrophils Relative %: 62 %
Platelet Count: 174 10*3/uL (ref 150–400)
RBC: 3.75 MIL/uL — ABNORMAL LOW (ref 4.22–5.81)
RDW: 18.2 % — ABNORMAL HIGH (ref 11.5–15.5)
WBC Count: 4.8 10*3/uL (ref 4.0–10.5)
nRBC: 0 % (ref 0.0–0.2)

## 2022-06-27 LAB — CMP (CANCER CENTER ONLY)
ALT: 12 U/L (ref 0–44)
AST: 18 U/L (ref 15–41)
Albumin: 3.3 g/dL — ABNORMAL LOW (ref 3.5–5.0)
Alkaline Phosphatase: 93 U/L (ref 38–126)
Anion gap: 4 — ABNORMAL LOW (ref 5–15)
BUN: 19 mg/dL (ref 8–23)
CO2: 29 mmol/L (ref 22–32)
Calcium: 8.9 mg/dL (ref 8.9–10.3)
Chloride: 109 mmol/L (ref 98–111)
Creatinine: 1.05 mg/dL (ref 0.61–1.24)
GFR, Estimated: >60 mL/min (ref >60)
Glucose, Bld: 126 mg/dL — ABNORMAL HIGH (ref 70–99)
Potassium: 3.3 mmol/L — ABNORMAL LOW (ref 3.5–5.1)
Sodium: 142 mmol/L (ref 135–145)
Total Bilirubin: 1.6 mg/dL — ABNORMAL HIGH (ref 0.3–1.2)
Total Protein: 6.4 g/dL — ABNORMAL LOW (ref 6.5–8.1)

## 2022-06-27 LAB — CEA (IN HOUSE-CHCC): CEA (CHCC-In House): 6.74 ng/mL — ABNORMAL HIGH (ref 0.00–5.00)

## 2022-06-27 MED ORDER — SODIUM CHLORIDE 0.9% FLUSH
10.0000 mL | Freq: Once | INTRAVENOUS | Status: AC
  Administered 2022-06-27: 10 mL

## 2022-06-27 MED ORDER — HEPARIN SOD (PORK) LOCK FLUSH 100 UNIT/ML IV SOLN
500.0000 [IU] | Freq: Once | INTRAVENOUS | Status: AC
  Administered 2022-06-27: 500 [IU]

## 2022-06-27 NOTE — Telephone Encounter (Signed)
-----   Message from Pollyann Samples, NP sent at 06/27/2022 12:42 PM EDT ----- Darel Hong or Teryl Gubler,  Please let pt know his K is low, he tells me he takes 1 tab TID. Please verify, and review K rick diet with him.  Thanks, Clayborn Heron NP

## 2022-06-27 NOTE — Telephone Encounter (Signed)
Lm with note below. Asked to call Zachery Conch nurse

## 2022-06-28 ENCOUNTER — Telehealth: Payer: Self-pay | Admitting: Hematology

## 2022-06-28 NOTE — Telephone Encounter (Signed)
Contacted patient to scheduled appointments. Patient is aware of appointments that are scheduled.   

## 2022-06-30 ENCOUNTER — Other Ambulatory Visit: Payer: Self-pay

## 2022-07-02 ENCOUNTER — Other Ambulatory Visit: Payer: Self-pay

## 2022-07-04 ENCOUNTER — Telehealth: Payer: Self-pay | Admitting: *Deleted

## 2022-07-04 NOTE — Telephone Encounter (Signed)
PC to patient regarding his low potassium - 3.3 on 06/27/22.  Patient verified that he is taking his potassium 20 meq three times daily as prescribed.  Encouraged patient to eat potassium rich foods such as fresh fruits & vegetables.  Patient verbalizes understanding.

## 2022-07-04 NOTE — Telephone Encounter (Signed)
-----   Message from Lacie K Burton, NP sent at 06/27/2022 12:42 PM EDT ----- Judy or Tammi,  Please let pt know his K is low, he tells me he takes 1 tab TID. Please verify, and review K rick diet with him.  Thanks, Lacie NP 

## 2022-07-05 ENCOUNTER — Other Ambulatory Visit: Payer: Self-pay | Admitting: Cardiology

## 2022-07-14 ENCOUNTER — Other Ambulatory Visit: Payer: Self-pay | Admitting: Internal Medicine

## 2022-07-16 DIAGNOSIS — G629 Polyneuropathy, unspecified: Secondary | ICD-10-CM | POA: Insufficient documentation

## 2022-07-16 NOTE — Assessment & Plan Note (Signed)
-  Secondary to oxaliplatin which I have stopped in February 2024 -Patient reports worsening numbness in his fingers and toes today, I will slightly reduce Xeloda dose to see if it gets better.

## 2022-07-16 NOTE — Assessment & Plan Note (Signed)
-  diagnosed 02/2021 by endo-/colonoscopy for weight loss and anemia. Treatment delayed due to his stroke on 04/18/21. S/p 8 cycles neoadjuvant Keytruda 05/11/21 - 09/28/21. He tolerated well with mild fatigue.  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 -He began FOLFOX on 11/24/21. He has tolerated well overall with some taste changes and mild fatigue -restaging CT scan 02/20/2022 showed PR -restaging CT from 05/15/2022 showed improved masslike enlargement of the duodenal as well as fold nodularity and thickening along the stomach, however it showed a new liver lesion, indeterminate, will obtain liver MRI  -Patient complained of worsening fatigue, last cycle chemo was held on 05/18/22. -Liver MRI in March 2024 showed benign liver lesions and a cystic lesion in pancreas, no evidence of liver metastasis.   -Due to his neuropathy and fatigue, I changed his treatment to maintenance Xeloda.   

## 2022-07-17 ENCOUNTER — Other Ambulatory Visit: Payer: Self-pay | Admitting: *Deleted

## 2022-07-17 ENCOUNTER — Encounter: Payer: Self-pay | Admitting: Hematology

## 2022-07-17 ENCOUNTER — Inpatient Hospital Stay: Payer: Medicare Other

## 2022-07-17 ENCOUNTER — Inpatient Hospital Stay (HOSPITAL_BASED_OUTPATIENT_CLINIC_OR_DEPARTMENT_OTHER): Payer: Medicare Other | Admitting: Hematology

## 2022-07-17 VITALS — BP 135/59 | HR 70 | Temp 98.3°F | Resp 16 | Ht 66.0 in | Wt 208.0 lb

## 2022-07-17 DIAGNOSIS — C170 Malignant neoplasm of duodenum: Secondary | ICD-10-CM | POA: Diagnosis not present

## 2022-07-17 DIAGNOSIS — G620 Drug-induced polyneuropathy: Secondary | ICD-10-CM | POA: Diagnosis not present

## 2022-07-17 DIAGNOSIS — Z452 Encounter for adjustment and management of vascular access device: Secondary | ICD-10-CM | POA: Diagnosis not present

## 2022-07-17 DIAGNOSIS — D500 Iron deficiency anemia secondary to blood loss (chronic): Secondary | ICD-10-CM

## 2022-07-17 DIAGNOSIS — Z95828 Presence of other vascular implants and grafts: Secondary | ICD-10-CM

## 2022-07-17 LAB — CBC WITH DIFFERENTIAL (CANCER CENTER ONLY)
Abs Immature Granulocytes: 0.01 10*3/uL (ref 0.00–0.07)
Basophils Absolute: 0 10*3/uL (ref 0.0–0.1)
Basophils Relative: 0 %
Eosinophils Absolute: 0.2 10*3/uL (ref 0.0–0.5)
Eosinophils Relative: 3 %
HCT: 32.6 % — ABNORMAL LOW (ref 39.0–52.0)
Hemoglobin: 10.7 g/dL — ABNORMAL LOW (ref 13.0–17.0)
Immature Granulocytes: 0 %
Lymphocytes Relative: 20 %
Lymphs Abs: 1.2 10*3/uL (ref 0.7–4.0)
MCH: 30.1 pg (ref 26.0–34.0)
MCHC: 32.8 g/dL (ref 30.0–36.0)
MCV: 91.6 fL (ref 80.0–100.0)
Monocytes Absolute: 0.6 10*3/uL (ref 0.1–1.0)
Monocytes Relative: 11 %
Neutro Abs: 3.8 10*3/uL (ref 1.7–7.7)
Neutrophils Relative %: 66 %
Platelet Count: 145 10*3/uL — ABNORMAL LOW (ref 150–400)
RBC: 3.56 MIL/uL — ABNORMAL LOW (ref 4.22–5.81)
RDW: 21.6 % — ABNORMAL HIGH (ref 11.5–15.5)
WBC Count: 5.8 10*3/uL (ref 4.0–10.5)
nRBC: 0 % (ref 0.0–0.2)

## 2022-07-17 LAB — CMP (CANCER CENTER ONLY)
ALT: 14 U/L (ref 0–44)
AST: 19 U/L (ref 15–41)
Albumin: 3.4 g/dL — ABNORMAL LOW (ref 3.5–5.0)
Alkaline Phosphatase: 92 U/L (ref 38–126)
Anion gap: 5 (ref 5–15)
BUN: 20 mg/dL (ref 8–23)
CO2: 28 mmol/L (ref 22–32)
Calcium: 9.1 mg/dL (ref 8.9–10.3)
Chloride: 110 mmol/L (ref 98–111)
Creatinine: 1.12 mg/dL (ref 0.61–1.24)
GFR, Estimated: >60 mL/min (ref >60)
Glucose, Bld: 85 mg/dL (ref 70–99)
Potassium: 3.8 mmol/L (ref 3.5–5.1)
Sodium: 143 mmol/L (ref 135–145)
Total Bilirubin: 1.8 mg/dL — ABNORMAL HIGH (ref 0.3–1.2)
Total Protein: 6.2 g/dL — ABNORMAL LOW (ref 6.5–8.1)

## 2022-07-17 MED ORDER — HEPARIN SOD (PORK) LOCK FLUSH 100 UNIT/ML IV SOLN
500.0000 [IU] | Freq: Once | INTRAVENOUS | Status: AC
  Administered 2022-07-17: 500 [IU]

## 2022-07-17 MED ORDER — SODIUM CHLORIDE 0.9% FLUSH
10.0000 mL | Freq: Once | INTRAVENOUS | Status: AC
  Administered 2022-07-17: 10 mL

## 2022-07-17 NOTE — Progress Notes (Signed)
[facility]   [phone] Fax:([phone]) [phone]   Clinic Follow up Note   Patient Care Team: Corwin Levins, MD as PCP - General Jens Som, Madolyn Frieze, MD as PCP - Cardiology (Cardiology) Iva Boop, MD as Consulting Physician (Gastroenterology) Gean Birchwood, MD as Consulting Physician (Orthopedic Surgery) Sallye Lat, MD as Consulting Physician (Ophthalmology) Malachy Mood, MD as Consulting Physician (Oncology)  Date of Service:  07/17/2022  CHIEF COMPLAINT: f/u of adenocarcinoma of duodenum   CURRENT THERAPY: Xeloda 2 weeks on1/week off starting 05/2022    ASSESSMENT:  Aaron Moss is a 77 y.o. 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 Treatment delayed due to his stroke on 04/18/21. S/p 8 cycles neoadjuvant Keytruda 05/11/21 - 09/28/21. He tolerated well with mild fatigue.  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 -He began FOLFOX on 11/24/21. He has tolerated well overall with some taste changes and mild fatigue -restaging CT scan 02/20/2022 showed PR -restaging CT from 05/15/2022 showed improved masslike enlargement of the duodenal as well as fold nodularity and thickening along the stomach, however it showed a new liver lesion, indeterminate, will obtain liver MRI  -Patient complained of worsening fatigue, last cycle chemo was held on 05/18/22. -Liver MRI in March 2024 showed benign liver lesions and a cystic lesion in pancreas, no evidence of liver metastasis.   -Due to his neuropathy and fatigue, I changed his treatment to maintenance Xeloda.    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       PLAN: -lab reviewed --Continue Xeloda 3 tablet in the  AM , and 3 tablet in PM 2 wks on /1 wk off. --Repeat CT scan in June will order next visit. -lab and f/u in 3 weeks  SUMMARY OF ONCOLOGIC HISTORY: Oncology History Overview Note   Cancer Staging  Adenocarcinoma of duodenum [facility]) Staging form: Small Intestine - Adenocarcinoma, AJCC 8th Edition - Clinical stage from 03/18/2021: Stage IIIA (cTX, cN1, cM0) - Signed by Malachy Mood, MD on 06/22/2021 Stage prefix: Initial diagnosis     Adenocarcinoma of duodenum (HCC)  03/01/2021 Imaging   EXAM: ABDOMEN ULTRASOUND COMPLETE  IMPRESSION: 1. Simple appearing right renal cyst. 2. Otherwise negative abdominal ultrasound   03/18/2021 Procedure   Upper Endoscopy/Colonoscopy, Dr. Myrtie Neither  Upper Endoscopy Impression: - Normal esophagus. - A single submucosal papule (nodule) found in the stomach. - Duodenal mass. Biopsied. Concerning for malignancy.  Colonoscopy Impression: - Diverticulosis in the entire examined colon. - The examined portion of the ileum was normal. - Patent end-to-end colo-colonic anastomosis, characterized by healthy appearing mucosa. - Internal hemorrhoids. - No specimens collected.   03/18/2021 Initial Biopsy   Diagnosis Duodenum, Biopsy - INVASIVE MODERATE TO POORLY DIFFERENTIATED ADENOCARCINOMA   03/18/2021 Cancer Staging   Staging form: Small Intestine - Adenocarcinoma, AJCC 8th Edition - Clinical stage from 03/18/2021: Stage IIIA (cTX, cN1, cM0) - Signed by Malachy Mood, MD on 06/22/2021 Stage prefix: Initial diagnosis   03/22/2021 Initial Diagnosis   Adenocarcinoma of duodenum (HCC)   04/01/2021 Imaging   EXAM: CT CHEST, ABDOMEN, AND PELVIS WITH CONTRAST  IMPRESSION: 1. Circumferential wall thickening of the first portion of the duodenum, in keeping with known primary malignancy. 2. Enlarged, hypodense lymph nodes anterior to the  pancreatic head and in the portacaval station, concerning for nodal metastatic disease. 3. No other evidence of metastatic disease in  the chest, abdomen, or pelvis. 4. Incidental note of a fluid attenuation lesion within the proximal pancreatic body measuring 1.9 x 1.4 cm, with prominence of the pancreatic duct distally, up to 0.6 cm. This lesion was present on remote prior examination dated 05/04/2009, and has slightly increased in size over a long period of time. This is consistent with a small IPMN and benign given indolent behavior over greater than 10 years. 5. Background of very fine centrilobular pulmonary nodules, most concentrated in the lung apices, most commonly seen in smoking-related respiratory bronchiolitis. 6. Coronary artery disease.   Aortic Atherosclerosis (ICD10-I70.0).    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 Of note, a variant of uncertain significance was detected in the ATM (p.A869G), BLM (c.800-3T>G), and SDHA (p.V632F) genes. Report date is 04/27/2021.  The CancerNext-Expanded gene panel offered by [facility] and includes sequencing, rearrangement, and RNA analysis for the following 77 genes: AIP, ALK, APC, ATM, AXIN2, BAP1, BARD1, BLM, BMPR1A, BRCA1, BRCA2, BRIP1, CDC73, CDH1, CDK4, CDKN1B, CDKN2A, CHEK2, CTNNA1, DICER1, FANCC, FH, FLCN, GALNT12, KIF1B, LZTR1, MAX, MEN1, MET, MLH1, MSH2, MSH3, MSH6, MUTYH, NBN, NF1, NF2, NTHL1, PALB2, PHOX2B, PMS2, POT1, PRKAR1A, PTCH1, PTEN, RAD51C, RAD51D, RB1, RECQL, RET, SDHA, SDHAF2, SDHB, SDHC, SDHD, SMAD4, SMARCA4, SMARCB1, SMARCE1, STK11, SUFU, TMEM127, TP53, TSC1, TSC2, VHL and XRCC2 (sequencing and deletion/duplication); EGFR, EGLN1, HOXB13, KIT, MITF, PDGFRA, POLD1, and POLE (sequencing only); EPCAM and GREM1 (deletion/duplication only).    05/11/2021 - 09/28/2021 Chemotherapy   Patient is on Treatment Plan : COLORECTAL Pembrolizumab (200) q21d     11/24/2021 -  Chemotherapy   Patient is on Treatment Plan : COLORECTAL FOLFOX q14d x 4 months      02/20/2022 Imaging    IMPRESSION: 1. Distal stomach/duodenal mass is decreased in size when compared with prior exam. 2. Stable upper abdominal adenopathy. 3. 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 4. Aortic Atherosclerosis (ICD10-I70.0).   05/15/2022 Imaging    IMPRESSION: Improved masslike enlargement of the duodenal as well as fold nodularity and thickening along the stomach. In addition there are some small nodes identified in the upper abdomen which are similar overall compared to the study of December 2023   However there is a new spiculated nodule in the middle lobe which is worrisome. In addition there are some small low-attenuation liver lesions which are too small to completely characterize but were not clearly seen on the previous examination. Recommend further evaluation. For the liver lesions in particular a Eovist liver MRI may be of some benefit as clinically directed.   Persistent cystic lesion along the midbody of the pancreas with associated duct dilatation. A cystic neoplasm of the pancreas is possible.   06/06/2022 Imaging   MR Abdomen W Wo Contrast     IMPRESSION: 1. No definite evidence of hepatic metastatic disease. Scattered punctate foci of T2 hyperintensity throughout the liver are not associated with any suspicious enhancement or restricted diffusion and are favored to reflect small cysts or biliary hamartomas. Some of these are suboptimally characterized based on their small size, and given nonvisualization on older prior studies, attention on follow-up CT in 3-6 months recommended. 2. Unchanged residual irregular thickening of the walls of  the distal stomach and proximal duodenum. 3. 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 This lesion is unchanged  in size from recent CT, although has slowly enlarged from older prior examinations. Recommend follow-up abdominal MRI in 1 year. 4. Stable mild extrahepatic biliary dilatation status post cholecystectomy. 5. Aortic and branch vessel atherosclerosis, better seen on CT.      INTERVAL HISTORY:  Aaron Moss is here for a follow up of adenocarcinoma of duodenum . He was last seen by NP Lacie on 06/27/2022. He presents to the clinic accompanied by daughter-in law. Pt state that his hands feet are still having numbness /tingling in hands and feet. Pt state that it is getting harder for him to button his shirt and tied his shoes. Pt report  sharp pain on the right side abdominal.Pt stat it hurts when he get ready to lie down.    All other systems were reviewed with the patient and are negative.  MEDICAL HISTORY:  Past Medical History:  Diagnosis Date   Anemia 10/31/2021   Dx IDA   Carotid stenosis, left    Colon cancer (HCC) 2000   Coronary artery disease    per Dr. 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, volume 11.5 ml   Stroke (HCC) 04/18/2021   TIA (transient ischemic attack)     SURGICAL HISTORY: Past Surgical History:  Procedure Laterality Date   CHOLECYSTECTOMY N/A 10/19/2021   Procedure: CHOLECYSTECTOMY;  Surgeon: Almond Lint, MD;  Location: [facility] OR;  Service: General;  Laterality: N/A;   COLON SURGERY  2000   colon cancer   COLONOSCOPY     ENDARTERECTOMY Left 04/25/2021   Procedure: LEFT ENDARTERECTOMY CAROTID;  Surgeon: Cephus Shelling, MD;  Location: [facility] OR;  Service: Vascular;  Laterality: Left;    EUS     pancreatic cyst   KNEE ARTHROSCOPY  2007   LT- [facility]   LAPAROSCOPY N/A 10/19/2021   Procedure: LAPAROSCOPY DIAGNOSTIC;  Surgeon: Almond Lint, MD;  Location: [facility] OR;  Service: General;  Laterality: N/A;  GENERAL AND TAP BLOCK   PATCH ANGIOPLASTY Left 04/25/2021   Procedure: PATCH ANGIOPLASTY XENOSURE 1CM X 6CM;  Surgeon: Cephus Shelling, MD;  Location: [facility] OR;  Service: Vascular;  Laterality: Left;   PORTACATH PLACEMENT Left 11/17/2021   Procedure: PORT PLACEMENT;  Surgeon: Almond Lint, MD;  [facility] OR;  Service: General;  Laterality: Left;   PROSTATE BIOPSY  05/23/13   gleason 7, volume 11.5 ml   WHIPPLE PROCEDURE N/A 10/19/2021   Procedure: ATTEMPTED WHIPPLE PROCEDURE  WITH GASTRODUODENAL BYPASS;  Surgeon: Almond Lint, MD;  Location: [facility] OR;  Service: General;  Laterality: N/A;  GENERAL AND TAP BLOCK    I have reviewed the social history and family history with the patient and they are unchanged from previous note.  ALLERGIES:  is allergic to lyrica [pregabalin].  MEDICATIONS:  Current Outpatient Medications  Medication Sig Dispense Refill   amLODipine (NORVASC) 10 MG tablet Take 1 tablet by mouth once daily  90 tablet 3   aspirin EC 81 MG EC tablet Take 1 tablet (81 mg total) by mouth daily. Swallow whole. 30 tablet 11   atorvastatin (LIPITOR) 40 MG tablet Take 1 tablet by mouth once daily 90 tablet 2   benazepril (LOTENSIN) 40 MG tablet Take 1 tablet by mouth once daily 90 tablet 3   capecitabine (XELODA) 500 MG tablet Take 3 tablets (1,500 mg total) by mouth 2 (two) times daily after a meal. Take within 30 mins of a meal. Take for 14 days then off for 7 days. Repeat ever 21 days. 84 tablet 1   carvedilol (COREG) 12.5 MG tablet TAKE 1 TABLET BY MOUTH 2 TIMES DAILY. 180 tablet 3   iron polysaccharides (FERREX 150) 150 MG capsule Take 1 capsule by mouth twice daily 180 capsule 1   lidocaine-prilocaine (EMLA) cream Apply to affected area once 30 g 3   pantoprazole  (PROTONIX) 40 MG tablet Take 1 tablet by mouth once daily (Patient not taking: Reported on 06/07/2022) 90 tablet 0   potassium chloride 20 MEQ TBCR Take 20 mEq by mouth 3 (three) times daily. 90 tablet 0   potassium chloride SA (KLOR-CON M20) 20 MEQ tablet Take 1 tablet by mouth 3 (three) times daily.     prochlorperazine (COMPAZINE) 10 MG tablet Take 1 tablet (10 mg total) by mouth every 6 (six) hours as needed for nausea or vomiting (Use for nausea and / or vomiting unresolved with ondansetron (Zofran).). 30 tablet 0   traMADol (ULTRAM) 50 MG tablet Take 1 tablet (50 mg total) by mouth every 12 (twelve) hours as needed. 60 tablet 2   Travoprost, BAK Free, (TRAVATAN) 0.004 % SOLN ophthalmic solution Place 1 drop into both eyes at bedtime.     VITAMIN D PO Take 2 tablets by mouth daily. Gummies     No current facility-administered medications for this visit.    PHYSICAL EXAMINATION: ECOG PERFORMANCE STATUS: 1 - Symptomatic but completely ambulatory  Vitals:   07/17/22 1123  BP: (!) 135/59  Pulse: 70  Resp: 16  Temp: 98.3 F (36.8 C)  SpO2: 100%   Wt Readings from Last 3 Encounters:  07/17/22 208 lb (94.3 kg)  06/27/22 204 lb 9.6 oz (92.8 kg)  05/22/22 200 lb (90.7 kg)     GENERAL:alert, no distress and comfortable SKIN: skin color normal, no rashes or significant lesions EYES: normal, Conjunctiva are pink and non-injected, sclera clear  NEURO: alert & oriented x 3 with fluent speech ABDOMEN:(-) abdomen soft, (-) non-tender and normal bowel sounds. Pain on the right side quadrant of the abdomen.   LABORATORY DATA:  I have reviewed the data as listed    Latest Ref Rng & Units 07/17/2022   11:16 AM 06/27/2022   10:00 AM 05/18/2022   11:34 AM  CBC  WBC 4.0 - 10.5 K/uL 5.8  4.8  4.7   Hemoglobin 13.0 - 17.0 g/dL 16.1  09.6  04.5   Hematocrit 39.0 - 52.0 % 32.6  32.4  34.8   Platelets 150 - 400 K/uL 145  174  130         Latest Ref Rng & Units 07/17/2022   11:16 AM 06/27/2022    10:00 AM 05/18/2022   11:34 AM  CMP  Glucose 70 - 99 mg/dL 85  409  79   BUN 8 - 23 mg/dL 20  19  14    Creatinine 0.61 - 1.24 mg/dL 8.11  9.14  7.82   Sodium  135 - 145 mmol/L 143  142  143   Potassium 3.5 - 5.1 mmol/L 3.8  3.3  3.8   Chloride 98 - 111 mmol/L 110  109  110   CO2 22 - 32 mmol/L 28  29  29    Calcium 8.9 - 10.3 mg/dL 9.1  8.9  8.3   Total Protein 6.5 - 8.1 g/dL 6.2  6.4  5.5   Total Bilirubin 0.3 - 1.2 mg/dL 1.8  1.6  0.9   Alkaline Phos 38 - 126 U/L 92  93  94   AST 15 - 41 U/L 19  18  30    ALT 0 - 44 U/L 14  12  27        RADIOGRAPHIC STUDIES: I have personally reviewed the radiological images as listed and agreed with the findings in the report. No results found.    No orders of the defined types were placed in this encounter.  All questions were answered. The patient knows to call the clinic with any problems, questions or concerns. No barriers to learning was detected. The total time spent in the appointment was 25 minutes.     Malachy Mood, MD 07/17/2022   Carolin Coy, CMA, am acting as scribe for Malachy Mood, MD.   I have reviewed the above documentation for accuracy and completeness, and I agree with the above.

## 2022-07-19 ENCOUNTER — Telehealth: Payer: Self-pay | Admitting: Internal Medicine

## 2022-07-19 ENCOUNTER — Other Ambulatory Visit: Payer: Self-pay

## 2022-07-19 NOTE — Telephone Encounter (Signed)
Prescription Request  07/19/2022  LOV: 05/22/2022  What is the name of the medication or equipment?  atorvastatin (LIPITOR) 40 MG tablet  Have you contacted your pharmacy to request a refill? Yes   Which pharmacy would you like this sent to?  [id number] - [place] ([place]), [place] - [address] [place]) [place] [address] Phone: [phone] Fax: [phone]     Patient notified that their request is being sent to the clinical staff for review and that they should receive a response within 2 business days.   Please advise at Mobile [phone] (mobile)

## 2022-07-20 ENCOUNTER — Other Ambulatory Visit (HOSPITAL_COMMUNITY): Payer: Self-pay

## 2022-07-20 ENCOUNTER — Other Ambulatory Visit: Payer: Self-pay

## 2022-07-24 ENCOUNTER — Telehealth: Payer: Self-pay | Admitting: Internal Medicine

## 2022-07-24 NOTE — Telephone Encounter (Signed)
Called patient to schedule Medicare Annual Wellness Visit (AWV). Left message for patient to call back and schedule Medicare Annual Wellness Visit (AWV).  Last date of AWV: 08/12/2021  Please schedule an appointment at any time with [facility] .  If any questions, please contact me at [phone].  Thank you ,  Randon Goldsmith Care Guide [facility] AWV TEAM Direct Dial: [phone]

## 2022-07-26 ENCOUNTER — Other Ambulatory Visit: Payer: Self-pay | Admitting: Internal Medicine

## 2022-07-27 ENCOUNTER — Other Ambulatory Visit: Payer: Self-pay | Admitting: Internal Medicine

## 2022-07-28 ENCOUNTER — Other Ambulatory Visit: Payer: Self-pay

## 2022-08-03 ENCOUNTER — Other Ambulatory Visit (HOSPITAL_COMMUNITY): Payer: Self-pay

## 2022-08-03 ENCOUNTER — Other Ambulatory Visit: Payer: Self-pay | Admitting: Hematology

## 2022-08-03 DIAGNOSIS — C170 Malignant neoplasm of duodenum: Secondary | ICD-10-CM

## 2022-08-03 MED ORDER — CAPECITABINE 500 MG PO TABS
1500.0000 mg | ORAL_TABLET | Freq: Two times a day (BID) | ORAL | 1 refills | Status: DC
Start: 2022-08-03 — End: 2022-09-18
  Filled 2022-08-03: qty 84, 14d supply, fill #0
  Filled 2022-08-22: qty 84, 21d supply, fill #1

## 2022-08-07 ENCOUNTER — Other Ambulatory Visit (HOSPITAL_COMMUNITY): Payer: Self-pay

## 2022-08-08 NOTE — Progress Notes (Unsigned)
Symptom Management Consult Note [facility]    Patient Care Team: Corwin Levins, MD as PCP - General Jens Som, Madolyn Frieze, MD as PCP - Cardiology (Cardiology) Iva Boop, MD as Consulting Physician (Gastroenterology) Gean Birchwood, MD as Consulting Physician (Orthopedic Surgery) Sallye Lat, MD as Consulting Physician (Ophthalmology) Malachy Mood, MD as Consulting Physician (Oncology)    Name / MRN / DOB: Aaron Moss  [id number]  02-02-1946   Date of visit: 08/09/2022   Chief Complaint/Reason for visit: f/u adenocarcinoma of duodennum   Current Therapy: Xeloda 2 weeks on/1 week off starting 05/2022    ASSESSMENT & PLAN: Patient is a 77 y.o. 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  I have viewed most recent oncology note and lab work.    #Adenocarcinoma of duodenum -Currently on maintenance Xeloda - Restaging scan ordered for June (next month). Patient will follow up with primary oncology team to discuss results.  #Peripheral neuropathy -Per last oncology visit 07/17/22: secondary to oxaliplatin which was stopped in 04/2022. Xeloda dose was slightly reduced.    -lab reviewed --Continue Xeloda 3 tablet in the AM , and 3 tablet in PM 2 wks on /1 wk off. --Repeat CT scan in June will order next visit. -lab and f/u in 3 weeks   Heme/Onc History: Oncology History Overview Note   Cancer Staging  Adenocarcinoma of duodenum [facility]) Staging form: Small Intestine - Adenocarcinoma, AJCC 8th Edition - Clinical stage from 03/18/2021: Stage IIIA (cTX, cN1, cM0) - Signed by Malachy Mood, MD on 06/22/2021 Stage prefix: Initial diagnosis     Adenocarcinoma of duodenum (HCC)  03/01/2021 Imaging   EXAM: ABDOMEN ULTRASOUND COMPLETE  IMPRESSION: 1. Simple appearing right renal cyst. 2. Otherwise negative abdominal ultrasound   03/18/2021 Procedure   Upper Endoscopy/Colonoscopy, Dr. Myrtie Neither  Upper Endoscopy  Impression: - Normal esophagus. - A single submucosal papule (nodule) found in the stomach. - Duodenal mass. Biopsied. Concerning for malignancy.  Colonoscopy Impression: - Diverticulosis in the entire examined colon. - The examined portion of the ileum was normal. - Patent end-to-end colo-colonic anastomosis, characterized by healthy appearing mucosa. - Internal hemorrhoids. - No specimens collected.   03/18/2021 Initial Biopsy   Diagnosis Duodenum, Biopsy - INVASIVE MODERATE TO POORLY DIFFERENTIATED ADENOCARCINOMA   03/18/2021 Cancer Staging   Staging form: Small Intestine - Adenocarcinoma, AJCC 8th Edition - Clinical stage from 03/18/2021: Stage IIIA (cTX, cN1, cM0) - Signed by Malachy Mood, MD on 06/22/2021 Stage prefix: Initial diagnosis   03/22/2021 Initial Diagnosis   Adenocarcinoma of duodenum (HCC)   04/01/2021 Imaging   EXAM: CT CHEST, ABDOMEN, AND PELVIS WITH CONTRAST  IMPRESSION: 1. Circumferential wall thickening of the first portion of the duodenum, in keeping with known primary malignancy. 2. Enlarged, hypodense lymph nodes anterior to the pancreatic head and in the portacaval station, concerning for nodal metastatic disease. 3. No other evidence of metastatic disease in the chest, abdomen, or pelvis. 4. Incidental note of a fluid attenuation lesion within the proximal pancreatic body measuring 1.9 x 1.4 cm, with prominence of the pancreatic duct distally, up to 0.6 cm. This lesion was present on remote prior examination dated 05/04/2009, and has slightly increased in size over a long period of time. This is consistent with a small IPMN and benign given indolent behavior over greater than 10 years. 5. Background of very fine centrilobular pulmonary nodules, most concentrated in the lung apices, most commonly seen in smoking-related respiratory bronchiolitis.  6. Coronary artery disease.   Aortic Atherosclerosis (ICD10-I70.0).    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 Of note, a variant of uncertain significance was detected in the ATM (p.A869G), BLM (c.800-3T>G), and SDHA (p.V632F) genes. Report date is 04/27/2021.  The CancerNext-Expanded gene panel offered by [facility] and includes sequencing, rearrangement, and RNA analysis for the following 77 genes: AIP, ALK, APC, ATM, AXIN2, BAP1, BARD1, BLM, BMPR1A, BRCA1, BRCA2, BRIP1, CDC73, CDH1, CDK4, CDKN1B, CDKN2A, CHEK2, CTNNA1, DICER1, FANCC, FH, FLCN, GALNT12, KIF1B, LZTR1, MAX, MEN1, MET, MLH1, MSH2, MSH3, MSH6, MUTYH, NBN, NF1, NF2, NTHL1, PALB2, PHOX2B, PMS2, POT1, PRKAR1A, PTCH1, PTEN, RAD51C, RAD51D, RB1, RECQL, RET, SDHA, SDHAF2, SDHB, SDHC, SDHD, SMAD4, SMARCA4, SMARCB1, SMARCE1, STK11, SUFU, TMEM127, TP53, TSC1, TSC2, VHL and XRCC2 (sequencing and deletion/duplication); EGFR, EGLN1, HOXB13, KIT, MITF, PDGFRA, POLD1, and POLE (sequencing only); EPCAM and GREM1 (deletion/duplication only).    05/11/2021 - 09/28/2021 Chemotherapy   Patient is on Treatment Plan : COLORECTAL Pembrolizumab (200) q21d     11/24/2021 -  Chemotherapy   Patient is on Treatment Plan : COLORECTAL FOLFOX q14d x 4 months     02/20/2022 Imaging    IMPRESSION: 1. Distal stomach/duodenal mass is decreased in size when compared with prior exam. 2. Stable upper abdominal adenopathy. 3. 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 4. Aortic Atherosclerosis (ICD10-I70.0).   05/15/2022 Imaging    IMPRESSION: Improved masslike enlargement of the duodenal as well as fold nodularity and thickening along the stomach. In addition there are some small nodes identified in the upper abdomen which are similar overall compared to the study of December 2023   However there is a new spiculated nodule in the  middle lobe which is worrisome. In addition there are some small low-attenuation liver lesions which are too small to completely characterize but were not clearly seen on the previous examination. Recommend further evaluation. For the liver lesions in particular a Eovist liver MRI may be of some benefit as clinically directed.   Persistent cystic lesion along the midbody of the pancreas with associated duct dilatation. A cystic neoplasm of the pancreas is possible.   06/06/2022 Imaging   MR Abdomen W Wo Contrast     IMPRESSION: 1. No definite evidence of hepatic metastatic disease. Scattered punctate foci of T2 hyperintensity throughout the liver are not associated with any suspicious enhancement or restricted diffusion and are favored to reflect small cysts or biliary hamartomas. Some of these are suboptimally characterized based on their small size, and given nonvisualization on older prior studies, attention on follow-up CT in 3-6 months recommended. 2. Unchanged residual irregular thickening of the walls of the distal stomach and proximal duodenum. 3. 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 This lesion is unchanged in size from recent CT, although has slowly enlarged from older prior examinations. Recommend follow-up abdominal MRI in 1 year. 4. Stable mild extrahepatic biliary dilatation status post cholecystectomy. 5. Aortic and branch vessel atherosclerosis, better seen on CT.       Interval history-: Aaron Moss is a 77 y.o. male with oncologic history as above presenting to [facility] today with chief complaint of      ROS  All other systems are reviewed and are negative for acute change except  as noted in the HPI.    Allergies  Allergen Reactions   Lyrica [Pregabalin] Other (See Comments)    "Just made me feel bad"     Past Medical History:  Diagnosis Date   Anemia 10/31/2021    Dx IDA   Carotid stenosis, left    Colon cancer (HCC) 2000   Coronary artery disease    per Dr. 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, volume 11.5 ml   Stroke (HCC) 04/18/2021   TIA (transient ischemic attack)      Past Surgical History:  Procedure Laterality Date   CHOLECYSTECTOMY N/A 10/19/2021   Procedure: CHOLECYSTECTOMY;  Surgeon: Almond Lint, MD;  Location: [facility] OR;  Service: General;  Laterality: N/A;   COLON SURGERY  2000   colon cancer   COLONOSCOPY     ENDARTERECTOMY Left 04/25/2021   Procedure: LEFT ENDARTERECTOMY CAROTID;  Surgeon: Cephus Shelling, MD;  Location: [facility] OR;  Service: Vascular;  Laterality: Left;   EUS     pancreatic cyst   KNEE ARTHROSCOPY  2007   LT- [facility]   LAPAROSCOPY N/A 10/19/2021   Procedure: LAPAROSCOPY DIAGNOSTIC;  Surgeon: Almond Lint, MD;  Location: [facility] OR;  Service: General;  Laterality: N/A;  GENERAL AND TAP BLOCK   PATCH ANGIOPLASTY Left 04/25/2021   Procedure: PATCH ANGIOPLASTY XENOSURE 1CM X 6CM;  Surgeon: Cephus Shelling, MD;  Location: [facility] OR;  Service: Vascular;  Laterality: Left;   PORTACATH PLACEMENT Left 11/17/2021   Procedure: PORT PLACEMENT;  Surgeon: Almond Lint, MD;  Location: [facility] OR;  Service: General;  Laterality: Left;   PROSTATE BIOPSY  05/23/13   gleason 7, volume 11.5 ml   WHIPPLE PROCEDURE N/A 10/19/2021   Procedure: ATTEMPTED WHIPPLE PROCEDURE  WITH GASTRODUODENAL BYPASS;  Surgeon: Almond Lint, MD;  Location: [facility] OR;  Service: General;  Laterality: N/A;  GENERAL AND TAP BLOCK    Social History   Socioeconomic History   Marital status: Married     Spouse name: Earley Abide   Number of children: 4   Years of education: Not on file   Highest education level: Not on file  Occupational History   Not on file  Tobacco Use   Smoking status: Former    Packs/day: 1.00    Years: 22.00    Additional pack years: 0.00    Total pack years: 22.00    Types: Cigarettes    Quit date: 03/20/1988    Years since quitting: 34.4   Smokeless tobacco: Never  Vaping Use   Vaping Use: Never used  Substance and Sexual Activity   Alcohol use: Not Currently    Comment: rare   Drug use: No   Sexual activity: Not Currently  Other Topics Concern   Not on file  Social History Narrative   Not on file   Social Determinants of Health   Financial Resource Strain: Low Risk  (08/11/2020)   Overall Financial Resource Strain (CARDIA)    Difficulty of Paying Living Expenses: Not hard at all  Food Insecurity: No Food Insecurity (08/11/2020)  Hunger Vital Sign    Worried About Running Out of Food in the Last Year: Never true    Ran Out of Food in the Last Year: Never true  Transportation Needs: No Transportation Needs (08/11/2020)   PRAPARE - Administrator, Civil Service (Medical): No    Lack of Transportation (Non-Medical): No  Physical Activity: Sufficiently Active (08/11/2020)   Exercise Vital Sign    Days of Exercise per Week: 3 days    Minutes of Exercise per Session: 60 min  Stress: No Stress Concern Present (08/11/2020)   [employer] of Occupational Health - Occupational Stress Questionnaire    Feeling of Stress : Not at all  Social Connections: Socially Integrated (08/11/2020)   Social Connection and Isolation Panel [NHANES]    Frequency of Communication with Friends and Family: More than three times a week    Frequency of Social Gatherings with Friends and Family: More than three times a week    Attends Religious Services: 1 to 4 times per year    Active Member of [employer] or Organizations: Yes    Attends Banker Meetings:  1 to 4 times per year    Marital Status: Married  Catering manager Violence: Not At Risk (05/03/2018)   Humiliation, Afraid, Rape, and Kick questionnaire    Fear of Current or Ex-Partner: No    Emotionally Abused: No    Physically Abused: No    Sexually Abused: No    Family History  Problem Relation Age of Onset   Cancer Mother        Liver? bladder?, dx. late 18s   Colon cancer Sister 67   Cancer Brother        Lung   Diabetes Brother    Cancer Maternal Aunt        unknown type   Breast cancer Maternal Grandmother        dx. >50   Cancer Daughter 29       colon   Esophageal cancer Neg Hx    Rectal cancer Neg Hx    Stomach cancer Neg Hx      Current Outpatient Medications:    amLODipine (NORVASC) 10 MG tablet, Take 1 tablet by mouth once daily, Disp: 90 tablet, Rfl: 0   aspirin EC 81 MG EC tablet, Take 1 tablet (81 mg total) by mouth daily. Swallow whole., Disp: 30 tablet, Rfl: 11   atorvastatin (LIPITOR) 40 MG tablet, Take 1 tablet by mouth once daily, Disp: 90 tablet, Rfl: 2   benazepril (LOTENSIN) 40 MG tablet, Take 1 tablet by mouth once daily, Disp: 90 tablet, Rfl: 0   capecitabine (XELODA) 500 MG tablet, Take 3 tablets (1,500 mg total) by mouth 2 (two) times daily after a meal. Take within 30 mins of a meal. Take for 14 days then off for 7 days. Repeat ever 21 days., Disp: 84 tablet, Rfl: 1   carvedilol (COREG) 12.5 MG tablet, TAKE 1 TABLET BY MOUTH 2 TIMES DAILY., Disp: 180 tablet, Rfl: 3   FERREX 150 150 MG capsule, Take 1 capsule by mouth twice daily, Disp: 180 capsule, Rfl: 0   lidocaine-prilocaine (EMLA) cream, Apply to affected area once, Disp: 30 g, Rfl: 3   pantoprazole (PROTONIX) 40 MG tablet, Take 1 tablet by mouth once daily (Patient not taking: Reported on 06/07/2022), Disp: 90 tablet, Rfl: 0   potassium chloride 20 MEQ TBCR, Take 20 mEq by mouth 3 (three) times daily., Disp: 90 tablet, Rfl: 0  potassium chloride SA (KLOR-CON M20) 20 MEQ tablet, Take 1 tablet  by mouth 3 (three) times daily., Disp: , Rfl:    prochlorperazine (COMPAZINE) 10 MG tablet, Take 1 tablet (10 mg total) by mouth every 6 (six) hours as needed for nausea or vomiting (Use for nausea and / or vomiting unresolved with ondansetron (Zofran).)., Disp: 30 tablet, Rfl: 0   traMADol (ULTRAM) 50 MG tablet, Take 1 tablet (50 mg total) by mouth every 12 (twelve) hours as needed., Disp: 60 tablet, Rfl: 2   Travoprost, BAK Free, (TRAVATAN) 0.004 % SOLN ophthalmic solution, Place 1 drop into both eyes at bedtime., Disp: , Rfl:    VITAMIN D PO, Take 2 tablets by mouth daily. Gummies, Disp: , Rfl:   PHYSICAL EXAM: ECOG FS:{CHL ONC KG:[id number]}   There were no vitals filed for this visit. Physical Exam     LABORATORY DATA: I have reviewed the data as listed    Latest Ref Rng & Units 07/17/2022   11:16 AM 06/27/2022   10:00 AM 05/18/2022   11:34 AM  CBC  WBC 4.0 - 10.5 K/uL 5.8  4.8  4.7   Hemoglobin 13.0 - 17.0 g/dL 64.4  03.4  74.2   Hematocrit 39.0 - 52.0 % 32.6  32.4  34.8   Platelets 150 - 400 K/uL 145  174  130         Latest Ref Rng & Units 07/17/2022   11:16 AM 06/27/2022   10:00 AM 05/18/2022   11:34 AM  CMP  Glucose 70 - 99 mg/dL 85  595  79   BUN 8 - 23 mg/dL 20  19  14    Creatinine 0.61 - 1.24 mg/dL 6.38  7.56  4.33   Sodium 135 - 145 mmol/L 143  142  143   Potassium 3.5 - 5.1 mmol/L 3.8  3.3  3.8   Chloride 98 - 111 mmol/L 110  109  110   CO2 22 - 32 mmol/L 28  29  29    Calcium 8.9 - 10.3 mg/dL 9.1  8.9  8.3   Total Protein 6.5 - 8.1 g/dL 6.2  6.4  5.5   Total Bilirubin 0.3 - 1.2 mg/dL 1.8  1.6  0.9   Alkaline Phos 38 - 126 U/L 92  93  94   AST 15 - 41 U/L 19  18  30    ALT 0 - 44 U/L 14  12  27         RADIOGRAPHIC STUDIES (from last 24 hours if applicable) I have personally reviewed the radiological images as listed and agreed with the findings in the report. No results found.      Visit Diagnosis: No diagnosis found.   No orders of the defined  types were placed in this encounter.   All questions were answered. The patient knows to call the clinic with any problems, questions or concerns. No barriers to learning was detected.  A total of more than *** minutes were spent on this encounter with face-to-face time and non-face-to-face time, including preparing to see the patient, ordering tests and/or medications, counseling the patient and coordination of care as outlined above.    Thank you for allowing me to participate in the care of this patient.    Shanon Ace, PA-C Department of Hematology/Oncology [facility] at [facility] Phone: [phone]  [phone]    08/08/2022 10:38 PM

## 2022-08-09 ENCOUNTER — Inpatient Hospital Stay: Payer: Medicare Other

## 2022-08-09 ENCOUNTER — Other Ambulatory Visit: Payer: Self-pay | Admitting: Nurse Practitioner

## 2022-08-09 ENCOUNTER — Inpatient Hospital Stay: Payer: Medicare Other | Attending: Nurse Practitioner | Admitting: Physician Assistant

## 2022-08-09 VITALS — BP 136/50 | HR 60 | Temp 97.8°F | Resp 16 | Wt 213.6 lb

## 2022-08-09 DIAGNOSIS — Z87891 Personal history of nicotine dependence: Secondary | ICD-10-CM | POA: Diagnosis not present

## 2022-08-09 DIAGNOSIS — Z7982 Long term (current) use of aspirin: Secondary | ICD-10-CM | POA: Insufficient documentation

## 2022-08-09 DIAGNOSIS — M7989 Other specified soft tissue disorders: Secondary | ICD-10-CM

## 2022-08-09 DIAGNOSIS — D500 Iron deficiency anemia secondary to blood loss (chronic): Secondary | ICD-10-CM

## 2022-08-09 DIAGNOSIS — C170 Malignant neoplasm of duodenum: Secondary | ICD-10-CM | POA: Diagnosis not present

## 2022-08-09 DIAGNOSIS — Z452 Encounter for adjustment and management of vascular access device: Secondary | ICD-10-CM | POA: Insufficient documentation

## 2022-08-09 DIAGNOSIS — R600 Localized edema: Secondary | ICD-10-CM | POA: Insufficient documentation

## 2022-08-09 DIAGNOSIS — D508 Other iron deficiency anemias: Secondary | ICD-10-CM | POA: Insufficient documentation

## 2022-08-09 DIAGNOSIS — G629 Polyneuropathy, unspecified: Secondary | ICD-10-CM

## 2022-08-09 DIAGNOSIS — Z9221 Personal history of antineoplastic chemotherapy: Secondary | ICD-10-CM | POA: Insufficient documentation

## 2022-08-09 DIAGNOSIS — Z79899 Other long term (current) drug therapy: Secondary | ICD-10-CM | POA: Diagnosis not present

## 2022-08-09 DIAGNOSIS — Z90411 Acquired partial absence of pancreas: Secondary | ICD-10-CM | POA: Diagnosis not present

## 2022-08-09 DIAGNOSIS — Z95828 Presence of other vascular implants and grafts: Secondary | ICD-10-CM

## 2022-08-09 LAB — CBC WITH DIFFERENTIAL (CANCER CENTER ONLY)
Abs Immature Granulocytes: 0.03 10*3/uL (ref 0.00–0.07)
Basophils Absolute: 0 10*3/uL (ref 0.0–0.1)
Basophils Relative: 1 %
Eosinophils Absolute: 0.3 10*3/uL (ref 0.0–0.5)
Eosinophils Relative: 4 %
HCT: 32.1 % — ABNORMAL LOW (ref 39.0–52.0)
Hemoglobin: 10.9 g/dL — ABNORMAL LOW (ref 13.0–17.0)
Immature Granulocytes: 1 %
Lymphocytes Relative: 20 %
Lymphs Abs: 1.3 10*3/uL (ref 0.7–4.0)
MCH: 31.9 pg (ref 26.0–34.0)
MCHC: 34 g/dL (ref 30.0–36.0)
MCV: 93.9 fL (ref 80.0–100.0)
Monocytes Absolute: 0.9 10*3/uL (ref 0.1–1.0)
Monocytes Relative: 13 %
Neutro Abs: 4 10*3/uL (ref 1.7–7.7)
Neutrophils Relative %: 61 %
Platelet Count: 172 10*3/uL (ref 150–400)
RBC: 3.42 MIL/uL — ABNORMAL LOW (ref 4.22–5.81)
RDW: 23.2 % — ABNORMAL HIGH (ref 11.5–15.5)
WBC Count: 6.4 10*3/uL (ref 4.0–10.5)
nRBC: 0.5 % — ABNORMAL HIGH (ref 0.0–0.2)

## 2022-08-09 LAB — CMP (CANCER CENTER ONLY)
ALT: 12 U/L (ref 0–44)
AST: 19 U/L (ref 15–41)
Albumin: 3.5 g/dL (ref 3.5–5.0)
Alkaline Phosphatase: 97 U/L (ref 38–126)
Anion gap: 3 — ABNORMAL LOW (ref 5–15)
BUN: 22 mg/dL (ref 8–23)
CO2: 29 mmol/L (ref 22–32)
Calcium: 8.8 mg/dL — ABNORMAL LOW (ref 8.9–10.3)
Chloride: 110 mmol/L (ref 98–111)
Creatinine: 1.1 mg/dL (ref 0.61–1.24)
GFR, Estimated: >60 mL/min (ref >60)
Glucose, Bld: 80 mg/dL (ref 70–99)
Potassium: 4 mmol/L (ref 3.5–5.1)
Sodium: 142 mmol/L (ref 135–145)
Total Bilirubin: 1.8 mg/dL — ABNORMAL HIGH (ref 0.3–1.2)
Total Protein: 6.3 g/dL — ABNORMAL LOW (ref 6.5–8.1)

## 2022-08-09 LAB — CEA (IN HOUSE-CHCC): CEA (CHCC-In House): 6.77 ng/mL — ABNORMAL HIGH (ref 0.00–5.00)

## 2022-08-09 MED ORDER — SODIUM CHLORIDE 0.9% FLUSH
10.0000 mL | Freq: Once | INTRAVENOUS | Status: AC | PRN
  Administered 2022-08-09: 10 mL

## 2022-08-09 MED ORDER — HEPARIN SOD (PORK) LOCK FLUSH 100 UNIT/ML IV SOLN
500.0000 [IU] | Freq: Once | INTRAVENOUS | Status: AC | PRN
  Administered 2022-08-09: 500 [IU]

## 2022-08-09 MED ORDER — DULOXETINE HCL 30 MG PO CPEP: ORAL_CAPSULE | ORAL | 0 refills | Status: DC

## 2022-08-12 ENCOUNTER — Other Ambulatory Visit (HOSPITAL_COMMUNITY): Payer: Self-pay

## 2022-08-21 ENCOUNTER — Other Ambulatory Visit: Payer: Self-pay | Admitting: Internal Medicine

## 2022-08-22 ENCOUNTER — Other Ambulatory Visit: Payer: Self-pay

## 2022-08-22 ENCOUNTER — Other Ambulatory Visit (HOSPITAL_COMMUNITY): Payer: Self-pay

## 2022-08-28 ENCOUNTER — Other Ambulatory Visit: Payer: Self-pay

## 2022-08-28 ENCOUNTER — Ambulatory Visit (HOSPITAL_COMMUNITY)
Admission: RE | Admit: 2022-08-28 | Discharge: 2022-08-28 | Disposition: A | Discharge status: Home / Self Care | Payer: Medicare Other | Source: Ambulatory Visit | Attending: Nurse Practitioner | Admitting: Nurse Practitioner

## 2022-08-28 DIAGNOSIS — C170 Malignant neoplasm of duodenum: Secondary | ICD-10-CM | POA: Diagnosis present

## 2022-08-28 MED ORDER — IOHEXOL 300 MG/ML  SOLN
100.0000 mL | Freq: Once | INTRAMUSCULAR | Status: AC | PRN
  Administered 2022-08-28: 100 mL via INTRAVENOUS

## 2022-08-28 MED ORDER — HEPARIN SOD (PORK) LOCK FLUSH 100 UNIT/ML IV SOLN
500.0000 [IU] | Freq: Once | INTRAVENOUS | Status: AC
  Administered 2022-08-28: 500 [IU] via INTRAVENOUS

## 2022-08-28 MED ORDER — IOHEXOL 9 MG/ML PO SOLN
1000.0000 mL | ORAL | Status: AC
  Administered 2022-08-28: 1000 mL via ORAL

## 2022-08-28 MED ORDER — SODIUM CHLORIDE (PF) 0.9 % IJ SOLN
INTRAMUSCULAR | Status: AC
  Filled 2022-08-28: qty 50

## 2022-08-28 MED ORDER — HEPARIN SOD (PORK) LOCK FLUSH 100 UNIT/ML IV SOLN
INTRAVENOUS | Status: AC
  Filled 2022-08-28: qty 5

## 2022-09-01 ENCOUNTER — Inpatient Hospital Stay: Payer: Medicare Other | Admitting: Hematology

## 2022-09-01 ENCOUNTER — Other Ambulatory Visit: Payer: Self-pay

## 2022-09-01 ENCOUNTER — Encounter: Payer: Self-pay | Admitting: Hematology

## 2022-09-01 ENCOUNTER — Inpatient Hospital Stay: Payer: Medicare Other | Attending: Nurse Practitioner

## 2022-09-01 VITALS — BP 179/67 | HR 65 | Temp 98.4°F | Resp 16 | Wt 211.3 lb

## 2022-09-01 DIAGNOSIS — G629 Polyneuropathy, unspecified: Secondary | ICD-10-CM | POA: Diagnosis not present

## 2022-09-01 DIAGNOSIS — Z5111 Encounter for antineoplastic chemotherapy: Secondary | ICD-10-CM | POA: Diagnosis present

## 2022-09-01 DIAGNOSIS — Z923 Personal history of irradiation: Secondary | ICD-10-CM | POA: Diagnosis not present

## 2022-09-01 DIAGNOSIS — D500 Iron deficiency anemia secondary to blood loss (chronic): Secondary | ICD-10-CM

## 2022-09-01 DIAGNOSIS — Z9884 Bariatric surgery status: Secondary | ICD-10-CM | POA: Insufficient documentation

## 2022-09-01 DIAGNOSIS — Z8546 Personal history of malignant neoplasm of prostate: Secondary | ICD-10-CM | POA: Insufficient documentation

## 2022-09-01 DIAGNOSIS — Z7982 Long term (current) use of aspirin: Secondary | ICD-10-CM | POA: Insufficient documentation

## 2022-09-01 DIAGNOSIS — C170 Malignant neoplasm of duodenum: Secondary | ICD-10-CM | POA: Diagnosis present

## 2022-09-01 DIAGNOSIS — Z8673 Personal history of transient ischemic attack (TIA), and cerebral infarction without residual deficits: Secondary | ICD-10-CM | POA: Diagnosis not present

## 2022-09-01 DIAGNOSIS — Z9049 Acquired absence of other specified parts of digestive tract: Secondary | ICD-10-CM | POA: Insufficient documentation

## 2022-09-01 DIAGNOSIS — Z79899 Other long term (current) drug therapy: Secondary | ICD-10-CM | POA: Diagnosis not present

## 2022-09-01 DIAGNOSIS — Z95828 Presence of other vascular implants and grafts: Secondary | ICD-10-CM

## 2022-09-01 DIAGNOSIS — D508 Other iron deficiency anemias: Secondary | ICD-10-CM | POA: Insufficient documentation

## 2022-09-01 LAB — CBC WITH DIFFERENTIAL (CANCER CENTER ONLY)
Abs Immature Granulocytes: 0.01 10*3/uL (ref 0.00–0.07)
Basophils Absolute: 0 10*3/uL (ref 0.0–0.1)
Basophils Relative: 0 %
Eosinophils Absolute: 0.3 10*3/uL (ref 0.0–0.5)
Eosinophils Relative: 4 %
HCT: 32.1 % — ABNORMAL LOW (ref 39.0–52.0)
Hemoglobin: 11.2 g/dL — ABNORMAL LOW (ref 13.0–17.0)
Immature Granulocytes: 0 %
Lymphocytes Relative: 24 %
Lymphs Abs: 1.5 10*3/uL (ref 0.7–4.0)
MCH: 33.3 pg (ref 26.0–34.0)
MCHC: 34.9 g/dL (ref 30.0–36.0)
MCV: 95.5 fL (ref 80.0–100.0)
Monocytes Absolute: 0.9 10*3/uL (ref 0.1–1.0)
Monocytes Relative: 14 %
Neutro Abs: 3.7 10*3/uL (ref 1.7–7.7)
Neutrophils Relative %: 58 %
Platelet Count: 160 10*3/uL (ref 150–400)
RBC: 3.36 MIL/uL — ABNORMAL LOW (ref 4.22–5.81)
RDW: 22.2 % — ABNORMAL HIGH (ref 11.5–15.5)
WBC Count: 6.5 10*3/uL (ref 4.0–10.5)
nRBC: 0 % (ref 0.0–0.2)

## 2022-09-01 LAB — CMP (CANCER CENTER ONLY)
ALT: 12 U/L (ref 0–44)
AST: 19 U/L (ref 15–41)
Albumin: 3.2 g/dL — ABNORMAL LOW (ref 3.5–5.0)
Alkaline Phosphatase: 105 U/L (ref 38–126)
Anion gap: 3 — ABNORMAL LOW (ref 5–15)
BUN: 16 mg/dL (ref 8–23)
CO2: 28 mmol/L (ref 22–32)
Calcium: 9 mg/dL (ref 8.9–10.3)
Chloride: 107 mmol/L (ref 98–111)
Creatinine: 0.94 mg/dL (ref 0.61–1.24)
GFR, Estimated: >60 mL/min (ref >60)
Glucose, Bld: 80 mg/dL (ref 70–99)
Potassium: 3.8 mmol/L (ref 3.5–5.1)
Sodium: 138 mmol/L (ref 135–145)
Total Bilirubin: 1.6 mg/dL — ABNORMAL HIGH (ref 0.3–1.2)
Total Protein: 6.1 g/dL — ABNORMAL LOW (ref 6.5–8.1)

## 2022-09-01 LAB — CEA (IN HOUSE-CHCC): CEA (CHCC-In House): 7.14 ng/mL — ABNORMAL HIGH (ref 0.00–5.00)

## 2022-09-01 MED ORDER — SODIUM CHLORIDE 0.9% FLUSH
10.0000 mL | Freq: Once | INTRAVENOUS | Status: AC
  Administered 2022-09-01: 10 mL

## 2022-09-01 MED ORDER — HEPARIN SOD (PORK) LOCK FLUSH 100 UNIT/ML IV SOLN
500.0000 [IU] | Freq: Once | INTRAVENOUS | Status: AC
  Administered 2022-09-01: 500 [IU]

## 2022-09-01 NOTE — Progress Notes (Signed)
DISCONTINUE OFF PATHWAY REGIMEN - Other   [id number] (Leucovorin IV D1 + Fluorouracil IV D1/CIV D1,2 + Oxaliplatin IV D1) q14 Days:   A cycle is every 14 days:     Oxaliplatin      Leucovorin      Fluorouracil      Fluorouracil   **Always confirm dose/schedule in your pharmacy ordering system**  REASON: Disease Progression PRIOR TREATMENT: [id number] (Leucovorin IV D1 + Fluorouracil IV D1/CIV D1,2 + Oxaliplatin IV D1) q14 Days TREATMENT RESPONSE: Partial Response (PR)  START OFF PATHWAY REGIMEN - Other   [id number] (Leucovorin IV D1 + Fluorouracil IV D1/CIV D1,2 + Irinotecan IV D1) q14 Days:   A cycle is every 14 days:     Irinotecan      Leucovorin      Fluorouracil      Fluorouracil   **Always confirm dose/schedule in your pharmacy ordering system**  Patient Characteristics: Intent of Therapy: Non-Curative / Palliative Intent, Discussed with Patient 

## 2022-09-01 NOTE — Progress Notes (Signed)
[facility]   Telephone:([phone] Fax:([phone]) [phone]   Clinic Follow up Note   Patient Care Team: Corwin Levins, MD as PCP - General Jens Som, Madolyn Frieze, MD as PCP - Cardiology (Cardiology) Iva Boop, MD as Consulting Physician (Gastroenterology) Gean Birchwood, MD as Consulting Physician (Orthopedic Surgery) Sallye Lat, MD as Consulting Physician (Ophthalmology) Malachy Mood, MD as Consulting Physician (Oncology)  Date of Service:  09/01/2022  CHIEF COMPLAINT: f/u of adenocarcinoma of duodenum    CURRENT THERAPY:  FOLFIRI Q14D starting 6/20  ASSESSMENT:  Aaron Moss is a 77 y.o. 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 Treatment delayed due to his stroke on 04/18/21. S/p 8 cycles neoadjuvant Keytruda 05/11/21 - 09/28/21. He tolerated well with mild fatigue.  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 -He began FOLFOX on 11/24/21. He has tolerated well overall with some taste changes and mild fatigue -restaging CT scan 02/20/2022 showed PR -restaging CT from 05/15/2022 showed improved masslike enlargement of the duodenal as well as fold nodularity and thickening along the stomach, however it showed a new liver lesion, indeterminate, will obtain liver MRI  -Patient complained of worsening fatigue, last cycle chemo was held on 05/18/22. -Liver MRI in March 2024 showed benign liver lesions and a cystic lesion in pancreas, no evidence of liver metastasis.   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  No evidence of distant metastasis. -Due to the cancer progression, and his  persistent peripheral neuropathy, I recommend change treatment to second line FOLFIRI --Chemotherapy consent: Side effects including but does not not limited to, fatigue, nausea, vomiting, diarrhea, hair loss, neuropathy, fluid retention, renal and kidney dysfunction, neutropenic fever, needed for blood transfusion, bleeding, were discussed with patient in great detail. He agrees to proceed. 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 in February 2024 -Patient reports worsening numbness in his fingers and toes today, stable lately       PLAN: -change chemo treatment to FOLFIRI, and will review the role of radiation  -start treatment in 1-2 weeks  -discontinue the Xeloda -lab reviewed - Starting [facility] 6/20 -office visit with second cycle chemo    SUMMARY OF ONCOLOGIC HISTORY: Oncology History Overview Note   Cancer Staging  Adenocarcinoma of duodenum [facility]) Staging form: Small Intestine - Adenocarcinoma, AJCC 8th Edition - Clinical stage from 03/18/2021: Stage IIIA (cTX, cN1, cM0) - Signed by Malachy Mood, MD on 06/22/2021 Stage prefix: Initial diagnosis     Adenocarcinoma of duodenum (HCC)  03/01/2021 Imaging   EXAM: ABDOMEN ULTRASOUND COMPLETE  IMPRESSION: 1. Simple appearing right renal cyst. 2. Otherwise negative abdominal ultrasound   03/18/2021 Procedure   Upper Endoscopy/Colonoscopy, Dr. Myrtie Neither  Upper Endoscopy Impression: - Normal esophagus. - A single submucosal papule (nodule) found in the stomach. - Duodenal mass. Biopsied. Concerning for malignancy.  Colonoscopy Impression: - Diverticulosis in the entire examined colon. - The examined portion of the ileum was normal. - Patent end-to-end colo-colonic anastomosis, characterized by healthy appearing mucosa. - Internal hemorrhoids. - No  specimens collected.    03/18/2021 Initial Biopsy   Diagnosis Duodenum, Biopsy - INVASIVE MODERATE TO POORLY DIFFERENTIATED ADENOCARCINOMA   03/18/2021 Cancer Staging   Staging form: Small Intestine - Adenocarcinoma, AJCC 8th Edition - Clinical stage from 03/18/2021: Stage IIIA (cTX, cN1, cM0) - Signed by Malachy Mood, MD on 06/22/2021 Stage prefix: Initial diagnosis   03/22/2021 Initial Diagnosis   Adenocarcinoma of duodenum (HCC)   04/01/2021 Imaging   EXAM: CT CHEST, ABDOMEN, AND PELVIS WITH CONTRAST  IMPRESSION: 1. Circumferential wall thickening of the first portion of the duodenum, in keeping with known primary malignancy. 2. Enlarged, hypodense lymph nodes anterior to the pancreatic head and in the portacaval station, concerning for nodal metastatic disease. 3. No other evidence of metastatic disease in the chest, abdomen, or pelvis. 4. Incidental note of a fluid attenuation lesion within the proximal pancreatic body measuring 1.9 x 1.4 cm, with prominence of the pancreatic duct distally, up to 0.6 cm. This lesion was present on remote prior examination dated 05/04/2009, and has slightly increased in size over a long period of time. This is consistent with a small IPMN and benign given indolent behavior over greater than 10 years. 5. Background of very fine centrilobular pulmonary nodules, most concentrated in the lung apices, most commonly seen in smoking-related respiratory bronchiolitis. 6. Coronary artery disease.   Aortic Atherosclerosis (ICD10-I70.0).    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 Of note, a variant of uncertain significance was detected in the ATM (p.A869G), BLM (c.800-3T>G), and SDHA (p.V632F) genes. Report date is 04/27/2021.  The CancerNext-Expanded gene panel offered by [employer] and includes sequencing, rearrangement, and RNA analysis for the following 77 genes:  AIP, ALK, APC, ATM, AXIN2, BAP1, BARD1, BLM, BMPR1A, BRCA1, BRCA2, BRIP1, CDC73, CDH1, CDK4, CDKN1B, CDKN2A, CHEK2, CTNNA1, DICER1, FANCC, FH, FLCN, GALNT12, KIF1B, LZTR1, MAX, MEN1, MET, MLH1, MSH2, MSH3, MSH6, MUTYH, NBN, NF1, NF2, NTHL1, PALB2, PHOX2B, PMS2, POT1, PRKAR1A, PTCH1, PTEN, RAD51C, RAD51D, RB1, RECQL, RET, SDHA, SDHAF2, SDHB, SDHC, SDHD, SMAD4, SMARCA4, SMARCB1, SMARCE1, STK11, SUFU, TMEM127, TP53, TSC1, TSC2, VHL and XRCC2 (sequencing and deletion/duplication); EGFR, EGLN1, HOXB13, KIT, MITF, PDGFRA, POLD1, and POLE (sequencing only); EPCAM and GREM1 (deletion/duplication only).    05/11/2021 - 09/28/2021 Chemotherapy   Patient is on Treatment Plan : COLORECTAL Pembrolizumab (200) q21d     11/24/2021 - 05/03/2022 Chemotherapy   Patient is on Treatment Plan : COLORECTAL FOLFOX q14d x 4 months     02/20/2022 Imaging    IMPRESSION: 1. Distal stomach/duodenal mass is decreased in size when compared with prior exam. 2. Stable upper abdominal adenopathy. 3. 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 4. Aortic Atherosclerosis (ICD10-I70.0).   05/15/2022 Imaging    IMPRESSION: Improved masslike enlargement of the duodenal as well as fold nodularity and thickening along the stomach. In addition there are some small nodes identified in the upper abdomen which are similar overall compared to the study of December 2023   However there is a new spiculated nodule in the middle lobe which is worrisome. In addition there are some small low-attenuation liver lesions which are too small to completely characterize but were not clearly seen on the previous examination. Recommend further evaluation. For the liver lesions in particular a Eovist liver MRI may be of some benefit as clinically directed.  Persistent cystic lesion along the midbody of the pancreas with associated  duct dilatation. A cystic neoplasm of the pancreas is possible.   06/06/2022 Imaging   MR Abdomen W Wo Contrast     IMPRESSION: 1. No definite evidence of hepatic metastatic disease. Scattered punctate foci of T2 hyperintensity throughout the liver are not associated with any suspicious enhancement or restricted diffusion and are favored to reflect small cysts or biliary hamartomas. Some of these are suboptimally characterized based on their small size, and given nonvisualization on older prior studies, attention on follow-up CT in 3-6 months recommended. 2. Unchanged residual irregular thickening of the walls of the distal stomach and proximal duodenum. 3. 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 This lesion is unchanged in size from recent CT, although has slowly enlarged from older prior examinations. Recommend follow-up abdominal MRI in 1 year. 4. Stable mild extrahepatic biliary dilatation status post cholecystectomy. 5. Aortic and branch vessel atherosclerosis, better seen on CT.   09/07/2022 -  Chemotherapy   Patient is on Treatment Plan : COLORECTAL FOLFIRI q14d        INTERVAL HISTORY:  Aaron Moss is here for a follow up of adenocarcinoma of duodenum. He was last seen by me on 07/17/2022. He presents to the clinic accompanied by wife. Pt is taking the Xeloda and he is on the off week. Pt state that his neuropathy is worse. He reports that he if takes his time he can button up his shirt.    All other systems were reviewed with the patient and are negative.  MEDICAL HISTORY:  Past Medical History:  Diagnosis Date   Anemia 10/31/2021   Dx IDA   Carotid stenosis, left    Colon cancer (HCC) 2000   Coronary artery disease    per Dr. 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   GERD (gastroesophageal reflux disease)    Glaucoma    History of  chemotherapy 2000   colon cancer   History of radiation therapy 08/06/13- 10/03/13   prostate 7800 cGy 40 sessions, seminal vesicles 5600 cGy 40 sessions   Hyperlipidemia    Hypertension    Left knee DJD    Prostate cancer (HCC) 05/23/2013   gleason 3+4=7, volume 11.5 ml   Stroke (HCC) 04/18/2021   TIA (transient ischemic attack)     SURGICAL HISTORY: Past Surgical History:  Procedure Laterality Date   CHOLECYSTECTOMY N/A 10/19/2021   Procedure: CHOLECYSTECTOMY;  Surgeon: Almond Lint, MD;  Location: [facility] OR;  Service: General;  Laterality: N/A;   COLON SURGERY  2000   colon cancer   COLONOSCOPY     ENDARTERECTOMY Left 04/25/2021   Procedure: LEFT ENDARTERECTOMY CAROTID;  Surgeon: Cephus Shelling, MD;  Location: [facility];  Service: Vascular;  Laterality: Left;   EUS     pancreatic cyst   KNEE ARTHROSCOPY  2007   LT- [facility]   LAPAROSCOPY N/A 10/19/2021   Procedure: LAPAROSCOPY DIAGNOSTIC;  Surgeon: Almond Lint, MD;  Location: [facility] OR;  Service: General;  Laterality: N/A;  GENERAL AND TAP BLOCK   PATCH ANGIOPLASTY Left 04/25/2021   Procedure: PATCH ANGIOPLASTY XENOSURE 1CM X 6CM;  Surgeon: Cephus Shelling, MD;  Location: [facility] OR;  Service: Vascular;  Laterality: Left;   PORTACATH PLACEMENT Left 11/17/2021   Procedure: PORT PLACEMENT;  Surgeon: Almond Lint, MD;  Location: [facility] OR;  Service: General;  Laterality: Left;   PROSTATE BIOPSY  05/23/13   gleason 7, volume 11.5 ml   WHIPPLE PROCEDURE N/A 10/19/2021   Procedure: ATTEMPTED WHIPPLE PROCEDURE  WITH GASTRODUODENAL BYPASS;  Surgeon: Almond Lint, MD;  Location: [facility] OR;  Service: General;  Laterality: N/A;  GENERAL AND TAP BLOCK    I have reviewed the social history and family history with the patient and they are unchanged from previous note.  ALLERGIES:  is allergic to lyrica [pregabalin].  MEDICATIONS:  Current Outpatient Medications  Medication Sig Dispense Refill   amLODipine (NORVASC) 10 MG tablet Take 1 tablet by mouth  once daily 90 tablet 0   aspirin EC 81 MG EC tablet Take 1 tablet (81 mg total) by mouth daily. Swallow whole. 30 tablet 11   atorvastatin (LIPITOR) 40 MG tablet Take 1 tablet by mouth once daily 90 tablet 2   benazepril (LOTENSIN) 40 MG tablet Take 1 tablet by mouth once daily 90 tablet 0   capecitabine (XELODA) 500 MG tablet Take 3 tablets (1,500 mg total) by mouth 2 (two) times daily after a meal. Take within 30 mins of a meal. Take for 14 days then off for 7 days. Repeat ever 21 days. 84 tablet 1   carvedilol (COREG) 12.5 MG tablet TAKE 1 TABLET BY MOUTH 2 TIMES DAILY. 180 tablet 3   DULoxetine (CYMBALTA) 30 MG capsule Take 1 capsule (30 mg total) by mouth daily for 7 days, THEN 2 capsules (60 mg total) daily for 24 days. 55 capsule 0   FERREX 150 150 MG capsule Take 1 capsule by mouth twice daily 180 capsule 0   pantoprazole (PROTONIX) 40 MG tablet Take 1 tablet by mouth once daily (Patient not taking: Reported on 06/07/2022) 90 tablet 0   potassium chloride 20 MEQ TBCR Take 20 mEq by mouth 3 (three) times daily. 90 tablet 0   potassium chloride SA (KLOR-CON M20) 20 MEQ tablet Take 1 tablet by mouth 3 (three) times daily.     prochlorperazine (COMPAZINE) 10 MG tablet Take 1 tablet (10 mg total) by mouth every 6 (six) hours as needed for nausea or vomiting (Use for nausea and / or vomiting unresolved with ondansetron (Zofran).). 30 tablet 0   traMADol (ULTRAM) 50 MG tablet TAKE 1 TABLET BY MOUTH EVERY 12 HOURS AS NEEDED 60 tablet 2   Travoprost, BAK Free, (TRAVATAN) 0.004 % SOLN ophthalmic solution Place 1 drop into both eyes at bedtime.     VITAMIN D PO Take 2 tablets by mouth daily. Gummies     No current facility-administered medications for this visit.    PHYSICAL EXAMINATION: ECOG PERFORMANCE STATUS: 1 - Symptomatic but completely ambulatory  Vitals:   09/01/22 1301  BP: (!) 179/67  Pulse: 65  Resp: 16  Temp: 98.4 F (36.9 C)  SpO2: 100%   Wt Readings from Last 3 Encounters:   09/01/22 211 lb 4.8 oz (95.8 kg)  08/09/22 213 lb 9.6 oz (96.9 kg)  07/17/22 208 lb (94.3 kg)     GENERAL:alert, no distress and comfortable SKIN: skin color normal, no rashes or significant lesions EYES: normal, Conjunctiva are pink and non-injected, sclera clear  NEURO: alert & oriented x 3 with fluent speech    LABORATORY DATA:  I have reviewed the data as listed    Latest Ref Rng & Units 09/01/2022   12:43 PM 08/09/2022   11:13 AM 07/17/2022   11:16 AM  CBC  WBC 4.0 - 10.5 K/uL 6.5  6.4  5.8  Hemoglobin 13.0 - 17.0 g/dL 82.9  56.2  13.0   Hematocrit 39.0 - 52.0 % 32.1  32.1  32.6   Platelets 150 - 400 K/uL 160  172  145         Latest Ref Rng & Units 09/01/2022   12:43 PM 08/09/2022   11:13 AM 07/17/2022   11:16 AM  CMP  Glucose 70 - 99 mg/dL 80  80  85   BUN 8 - 23 mg/dL 16  22  20    Creatinine 0.61 - 1.24 mg/dL 8.65  7.84  6.96   Sodium 135 - 145 mmol/L 138  142  143   Potassium 3.5 - 5.1 mmol/L 3.8  4.0  3.8   Chloride 98 - 111 mmol/L 107  110  110   CO2 22 - 32 mmol/L 28  29  28    Calcium 8.9 - 10.3 mg/dL 9.0  8.8  9.1   Total Protein 6.5 - 8.1 g/dL 6.1  6.3  6.2   Total Bilirubin 0.3 - 1.2 mg/dL 1.6  1.8  1.8   Alkaline Phos 38 - 126 U/L 105  97  92   AST 15 - 41 U/L 19  19  19    ALT 0 - 44 U/L 12  12  14        RADIOGRAPHIC STUDIES: I have personally reviewed the radiological images as listed and agreed with the findings in the report. No results found.    Orders Placed This Encounter  Procedures   CBC with Differential (Cancer Center Only)    Standing Status:   Future    Standing Expiration Date:   09/07/2023   CMP (Cancer Center only)    Standing Status:   Future    Standing Expiration Date:   09/07/2023   CBC with Differential (Cancer Center Only)    Standing Status:   Future    Standing Expiration Date:   09/20/2023   CMP (Cancer Center only)    Standing Status:   Future    Standing Expiration Date:   09/20/2023   CBC with Differential (Cancer  Center Only)    Standing Status:   Future    Standing Expiration Date:   10/04/2023   CMP (Cancer Center only)    Standing Status:   Future    Standing Expiration Date:   10/04/2023   CBC with Differential (Cancer Center Only)    Standing Status:   Future    Standing Expiration Date:   10/18/2023   CMP (Cancer Center only)    Standing Status:   Future    Standing Expiration Date:   10/18/2023   All questions were answered. The patient knows to call the clinic with any problems, questions or concerns. No barriers to learning was detected. The total time spent in the appointment was 40 minutes.     Malachy Mood, MD 09/01/2022   Carolin Coy, CMA, am acting as scribe for Malachy Mood, MD.   I have reviewed the above documentation for accuracy and completeness, and I agree with the above.

## 2022-09-04 ENCOUNTER — Telehealth: Payer: Self-pay | Admitting: Hematology

## 2022-09-05 ENCOUNTER — Other Ambulatory Visit: Payer: Self-pay

## 2022-09-06 MED FILL — Dexamethasone Sodium Phosphate Inj 100 MG/10ML: INTRAMUSCULAR | Qty: 1 | Status: AC

## 2022-09-07 ENCOUNTER — Inpatient Hospital Stay: Payer: Medicare Other

## 2022-09-07 ENCOUNTER — Other Ambulatory Visit: Payer: Self-pay

## 2022-09-07 ENCOUNTER — Other Ambulatory Visit: Payer: Self-pay | Admitting: Hematology

## 2022-09-07 VITALS — BP 154/60 | HR 66 | Temp 97.6°F | Resp 17 | Wt 207.5 lb

## 2022-09-07 DIAGNOSIS — Z5111 Encounter for antineoplastic chemotherapy: Secondary | ICD-10-CM | POA: Diagnosis not present

## 2022-09-07 DIAGNOSIS — D500 Iron deficiency anemia secondary to blood loss (chronic): Secondary | ICD-10-CM

## 2022-09-07 DIAGNOSIS — C170 Malignant neoplasm of duodenum: Secondary | ICD-10-CM

## 2022-09-07 DIAGNOSIS — Z95828 Presence of other vascular implants and grafts: Secondary | ICD-10-CM

## 2022-09-07 LAB — CMP (CANCER CENTER ONLY)
ALT: 12 U/L (ref 0–44)
AST: 20 U/L (ref 15–41)
Albumin: 3.2 g/dL — ABNORMAL LOW (ref 3.5–5.0)
Alkaline Phosphatase: 100 U/L (ref 38–126)
Anion gap: 4 — ABNORMAL LOW (ref 5–15)
BUN: 17 mg/dL (ref 8–23)
CO2: 27 mmol/L (ref 22–32)
Calcium: 8.8 mg/dL — ABNORMAL LOW (ref 8.9–10.3)
Chloride: 111 mmol/L (ref 98–111)
Creatinine: 1.09 mg/dL (ref 0.61–1.24)
GFR, Estimated: >60 mL/min (ref >60)
Glucose, Bld: 176 mg/dL — ABNORMAL HIGH (ref 70–99)
Potassium: 3.7 mmol/L (ref 3.5–5.1)
Sodium: 142 mmol/L (ref 135–145)
Total Bilirubin: 1.6 mg/dL — ABNORMAL HIGH (ref 0.3–1.2)
Total Protein: 5.9 g/dL — ABNORMAL LOW (ref 6.5–8.1)

## 2022-09-07 LAB — CBC WITH DIFFERENTIAL (CANCER CENTER ONLY)
Abs Immature Granulocytes: 0.02 10*3/uL (ref 0.00–0.07)
Basophils Absolute: 0 10*3/uL (ref 0.0–0.1)
Basophils Relative: 0 %
Eosinophils Absolute: 0.2 10*3/uL (ref 0.0–0.5)
Eosinophils Relative: 4 %
HCT: 34.5 % — ABNORMAL LOW (ref 39.0–52.0)
Hemoglobin: 11.7 g/dL — ABNORMAL LOW (ref 13.0–17.0)
Immature Granulocytes: 0 %
Lymphocytes Relative: 16 %
Lymphs Abs: 0.8 10*3/uL (ref 0.7–4.0)
MCH: 33.1 pg (ref 26.0–34.0)
MCHC: 33.9 g/dL (ref 30.0–36.0)
MCV: 97.7 fL (ref 80.0–100.0)
Monocytes Absolute: 0.5 10*3/uL (ref 0.1–1.0)
Monocytes Relative: 9 %
Neutro Abs: 3.6 10*3/uL (ref 1.7–7.7)
Neutrophils Relative %: 71 %
Platelet Count: 158 10*3/uL (ref 150–400)
RBC: 3.53 MIL/uL — ABNORMAL LOW (ref 4.22–5.81)
RDW: 21.3 % — ABNORMAL HIGH (ref 11.5–15.5)
WBC Count: 5.2 10*3/uL (ref 4.0–10.5)
nRBC: 0 % (ref 0.0–0.2)

## 2022-09-07 MED ORDER — SODIUM CHLORIDE 0.9 % IV SOLN: Freq: Once | INTRAVENOUS | Status: AC

## 2022-09-07 MED ORDER — ATROPINE SULFATE 1 MG/ML IV SOLN
0.5000 mg | Freq: Once | INTRAVENOUS | Status: AC | PRN
  Administered 2022-09-07: 0.5 mg via INTRAVENOUS
  Filled 2022-09-07: qty 1

## 2022-09-07 MED ORDER — SODIUM CHLORIDE 0.9 % IV SOLN
10.0000 mg | Freq: Once | INTRAVENOUS | Status: AC
  Administered 2022-09-07: 10 mg via INTRAVENOUS
  Filled 2022-09-07: qty 10

## 2022-09-07 MED ORDER — HEPARIN SOD (PORK) LOCK FLUSH 100 UNIT/ML IV SOLN: 500.0000 [IU] | Freq: Once | INTRAVENOUS | Status: DC | PRN

## 2022-09-07 MED ORDER — SODIUM CHLORIDE 0.9 % IV SOLN
2400.0000 mg/m2 | INTRAVENOUS | Status: DC
  Administered 2022-09-07: 5000 mg via INTRAVENOUS
  Filled 2022-09-07: qty 100

## 2022-09-07 MED ORDER — SODIUM CHLORIDE 0.9 % IV SOLN
150.0000 mg/m2 | Freq: Once | INTRAVENOUS | Status: AC
  Administered 2022-09-07: 300 mg via INTRAVENOUS
  Filled 2022-09-07: qty 15

## 2022-09-07 MED ORDER — SODIUM CHLORIDE 0.9% FLUSH
10.0000 mL | Freq: Once | INTRAVENOUS | Status: AC
  Administered 2022-09-07: 10 mL

## 2022-09-07 MED ORDER — SODIUM CHLORIDE 0.9% FLUSH: 10.0000 mL | INTRAVENOUS | Status: DC | PRN

## 2022-09-07 MED ORDER — PALONOSETRON HCL INJECTION 0.25 MG/5ML
0.2500 mg | Freq: Once | INTRAVENOUS | Status: AC
  Administered 2022-09-07: 0.25 mg via INTRAVENOUS
  Filled 2022-09-07: qty 5

## 2022-09-07 MED ORDER — SODIUM CHLORIDE 0.9 % IV SOLN
400.0000 mg/m2 | Freq: Once | INTRAVENOUS | Status: AC
  Administered 2022-09-07: 844 mg via INTRAVENOUS
  Filled 2022-09-07: qty 25

## 2022-09-07 NOTE — Patient Instructions (Signed)
[facility]  Discharge Instructions: Thank you for choosing [facility] to provide your oncology and hematology care.   If you have a lab appointment with the [facility], please go directly to the [facility] and check in at the registration area.   Wear comfortable clothing and clothing appropriate for easy access to any Portacath or PICC line.   We strive to give you quality time with your provider. You may need to reschedule your appointment if you arrive late (15 or more minutes).  Arriving late affects you and other patients whose appointments are after yours.  Also, if you miss three or more appointments without notifying the office, you may be dismissed from the clinic at the provider's discretion.      For prescription refill requests, have your pharmacy contact our office and allow 72 hours for refills to be completed.    Today you received the following chemotherapy and/or immunotherapy agents: Irinotecan and Fluorouracil       To help prevent nausea and vomiting after your treatment, we encourage you to take your nausea medication as directed.  BELOW ARE SYMPTOMS THAT SHOULD BE REPORTED IMMEDIATELY: *FEVER GREATER THAN 100.4 F (38 C) OR HIGHER *CHILLS OR SWEATING *NAUSEA AND VOMITING THAT IS NOT CONTROLLED WITH YOUR NAUSEA MEDICATION *UNUSUAL SHORTNESS OF BREATH *UNUSUAL BRUISING OR BLEEDING *URINARY PROBLEMS (pain or burning when urinating, or frequent urination) *BOWEL PROBLEMS (unusual diarrhea, constipation, pain near the anus) TENDERNESS IN MOUTH AND THROAT WITH OR WITHOUT PRESENCE OF ULCERS (sore throat, sores in mouth, or a toothache) UNUSUAL RASH, SWELLING OR PAIN  UNUSUAL VAGINAL DISCHARGE OR ITCHING   Items with * indicate a potential emergency and should be followed up as soon as possible or go to the Emergency Department if any problems should occur.  Please show the CHEMOTHERAPY ALERT CARD or IMMUNOTHERAPY  ALERT CARD at check-in to the Emergency Department and triage nurse.  Should you have questions after your visit or need to cancel or reschedule your appointment, please contact [place] [facility]  Dept: [phone]  and follow the prompts.  Office hours are 8:00 a.m. to 4:30 p.m. Monday - Friday. Please note that voicemails left after 4:00 p.m. may not be returned until the following business day.  We are closed weekends and major holidays. You have access to a nurse at all times for urgent questions. Please call the main number to the clinic Dept: [phone] and follow the prompts.   For any non-urgent questions, you may also contact your provider using MyChart. We now offer e-Visits for anyone 60 and older to request care online for non-urgent symptoms. For details visit [email or web address].   Also download the MyChart app! Go to the app store, search "MyChart", open the app, select [place], and log in with your MyChart username and password.  Irinotecan Injection What is this medication? IRINOTECAN (ir in oh TEE kan) treats some types of cancer. It works by slowing down the growth of cancer cells. This medicine may be used for other purposes; ask your health care provider or pharmacist if you have questions. COMMON BRAND NAME(S): Camptosar What should I tell my care team before I take this medication? They need to know if you have any of these conditions: Dehydration Diarrhea Infection, especially a viral infection, such as chickenpox, cold sores, herpes Liver disease Low blood cell levels (white cells, red cells, and platelets) Low levels of electrolytes, such  as calcium, magnesium, or potassium in your blood Recent or ongoing radiation An unusual or allergic reaction to irinotecan, other medications, foods, dyes, or preservatives If you or your partner are pregnant or trying to get pregnant Breast-feeding How should I use this medication? This  medication is injected into a vein. It is given by your care team in a hospital or clinic setting. Talk to your care team about the use of this medication in children. Special care may be needed. Overdosage: If you think you have taken too much of this medicine contact a poison control [facility] or emergency room at once. NOTE: This medicine is only for you. Do not share this medicine with others. What if I miss a dose? Keep appointments for follow-up doses. It is important not to miss your dose. Call your care team if you are unable to keep an appointment. What may interact with this medication? Do not take this medication with any of the following: Cobicistat Itraconazole This medication may also interact with the following: Certain antibiotics, such as clarithromycin, rifampin, rifabutin Certain antivirals for HIV or AIDS Certain medications for fungal infections, such as ketoconazole, posaconazole, voriconazole Certain medications for seizures, such as carbamazepine, phenobarbital, phenytoin Gemfibrozil Nefazodone St. John's wort This list may not describe all possible interactions. Give your health care provider a list of all the medicines, herbs, non-prescription drugs, or dietary supplements you use. Also tell them if you smoke, drink alcohol, or use illegal drugs. Some items may interact with your medicine. What should I watch for while using this medication? Your condition will be monitored carefully while you are receiving this medication. You may need blood work while taking this medication. This medication may make you feel generally unwell. This is not uncommon as chemotherapy can affect healthy cells as well as cancer cells. Report any side effects. Continue your course of treatment even though you feel ill unless your care team tells you to stop. This medication can cause serious side effects. To reduce the risk, your care team may give you other medications to take before receiving  this one. Be sure to follow the directions from your care team. This medication may affect your coordination, reaction time, or judgement. Do not drive or operate machinery until you know how this medication affects you. Sit up or stand slowly to reduce the risk of dizzy or fainting spells. Drinking alcohol with this medication can increase the risk of these side effects. This medication may increase your risk of getting an infection. Call your care team for advice if you get a fever, chills, sore throat, or other symptoms of a cold or flu. Do not treat yourself. Try to avoid being around people who are sick. Avoid taking medications that contain aspirin, acetaminophen, ibuprofen, naproxen, or ketoprofen unless instructed by your care team. These medications may hide a fever. This medication may increase your risk to bruise or bleed. Call your care team if you notice any unusual bleeding. Be careful brushing or flossing your teeth or using a toothpick because you may get an infection or bleed more easily. If you have any dental work done, tell your dentist you are receiving this medication. Talk to your care team if you or your partner are pregnant or think either of you might be pregnant. This medication can cause serious birth defects if taken during pregnancy and for 6 months after the last dose. You will need a negative pregnancy test before starting this medication. Contraception is recommended while taking  this medication and for 6 months after the last dose. Your care team can help you find the option that works for you. Do not father a child while taking this medication and for 3 months after the last dose. Use a condom for contraception during this time period. Do not breastfeed while taking this medication and for 7 days after the last dose. This medication may cause infertility. Talk to your care team if you are concerned about your fertility. What side effects may I notice from receiving this  medication? Side effects that you should report to your care team as soon as possible: Allergic reactions--skin rash, itching, hives, swelling of the face, lips, tongue, or throat Dry cough, shortness of breath or trouble breathing Increased saliva or tears, increased sweating, stomach cramping, diarrhea, small pupils, unusual weakness or fatigue, slow heartbeat Infection--fever, chills, cough, sore throat, wounds that don't heal, pain or trouble when passing urine, general feeling of discomfort or being unwell Kidney injury--decrease in the amount of urine, swelling of the ankles, hands, or feet Low red blood cell level--unusual weakness or fatigue, dizziness, headache, trouble breathing Severe or prolonged diarrhea Unusual bruising or bleeding Side effects that usually do not require medical attention (report to your care team if they continue or are bothersome): Constipation Diarrhea Hair loss Loss of appetite Nausea Stomach pain This list may not describe all possible side effects. Call your doctor for medical advice about side effects. You may report side effects to FDA at 1-800-FDA-1088. Where should I keep my medication? This medication is given in a hospital or clinic. It will not be stored at home. NOTE: This sheet is a summary. It may not cover all possible information. If you have questions about this medicine, talk to your doctor, pharmacist, or health care provider.  2024 Elsevier/Gold Standard (2021-07-18 00:00:00)  Fluorouracil Injection What is this medication? FLUOROURACIL (flure oh YOOR a sil) treats some types of cancer. It works by slowing down the growth of cancer cells. This medicine may be used for other purposes; ask your health care provider or pharmacist if you have questions. COMMON BRAND NAME(S): Adrucil What should I tell my care team before I take this medication? They need to know if you have any of these conditions: Blood disorders Dihydropyrimidine  dehydrogenase (DPD) deficiency Infection, such as chickenpox, cold sores, herpes Kidney disease Liver disease Poor nutrition Recent or ongoing radiation therapy An unusual or allergic reaction to fluorouracil, other medications, foods, dyes, or preservatives If you or your partner are pregnant or trying to get pregnant Breast-feeding How should I use this medication? This medication is injected into a vein. It is administered by your care team in a hospital or clinic setting. Talk to your care team about the use of this medication in children. Special care may be needed. Overdosage: If you think you have taken too much of this medicine contact a poison control [facility] or emergency room at once. NOTE: This medicine is only for you. Do not share this medicine with others. What if I miss a dose? Keep appointments for follow-up doses. It is important not to miss your dose. Call your care team if you are unable to keep an appointment. What may interact with this medication? Do not take this medication with any of the following: Live virus vaccines This medication may also interact with the following: Medications that treat or prevent blood clots, such as warfarin, enoxaparin, dalteparin This list may not describe all possible interactions. Give your health  care provider a list of all the medicines, herbs, non-prescription drugs, or dietary supplements you use. Also tell them if you smoke, drink alcohol, or use illegal drugs. Some items may interact with your medicine. What should I watch for while using this medication? Your condition will be monitored carefully while you are receiving this medication. This medication may make you feel generally unwell. This is not uncommon as chemotherapy can affect healthy cells as well as cancer cells. Report any side effects. Continue your course of treatment even though you feel ill unless your care team tells you to stop. In some cases, you may be given  additional medications to help with side effects. Follow all directions for their use. This medication may increase your risk of getting an infection. Call your care team for advice if you get a fever, chills, sore throat, or other symptoms of a cold or flu. Do not treat yourself. Try to avoid being around people who are sick. This medication may increase your risk to bruise or bleed. Call your care team if you notice any unusual bleeding. Be careful brushing or flossing your teeth or using a toothpick because you may get an infection or bleed more easily. If you have any dental work done, tell your dentist you are receiving this medication. Avoid taking medications that contain aspirin, acetaminophen, ibuprofen, naproxen, or ketoprofen unless instructed by your care team. These medications may hide a fever. Do not treat diarrhea with over the counter products. Contact your care team if you have diarrhea that lasts more than 2 days or if it is severe and watery. This medication can make you more sensitive to the sun. Keep out of the sun. If you cannot avoid being in the sun, wear protective clothing and sunscreen. Do not use sun lamps, tanning beds, or tanning booths. Talk to your care team if you or your partner wish to become pregnant or think you might be pregnant. This medication can cause serious birth defects if taken during pregnancy and for 3 months after the last dose. A reliable form of contraception is recommended while taking this medication and for 3 months after the last dose. Talk to your care team about effective forms of contraception. Do not father a child while taking this medication and for 3 months after the last dose. Use a condom while having sex during this time period. Do not breastfeed while taking this medication. This medication may cause infertility. Talk to your care team if you are concerned about your fertility. What side effects may I notice from receiving this  medication? Side effects that you should report to your care team as soon as possible: Allergic reactions--skin rash, itching, hives, swelling of the face, lips, tongue, or throat Heart attack--pain or tightness in the chest, shoulders, arms, or jaw, nausea, shortness of breath, cold or clammy skin, feeling faint or lightheaded Heart failure--shortness of breath, swelling of the ankles, feet, or hands, sudden weight gain, unusual weakness or fatigue Heart rhythm changes--fast or irregular heartbeat, dizziness, feeling faint or lightheaded, chest pain, trouble breathing High ammonia level--unusual weakness or fatigue, confusion, loss of appetite, nausea, vomiting, seizures Infection--fever, chills, cough, sore throat, wounds that don't heal, pain or trouble when passing urine, general feeling of discomfort or being unwell Low red blood cell level--unusual weakness or fatigue, dizziness, headache, trouble breathing Pain, tingling, or numbness in the hands or feet, muscle weakness, change in vision, confusion or trouble speaking, loss of balance or coordination, trouble walking, seizures  Redness, swelling, and blistering of the skin over hands and feet Severe or prolonged diarrhea Unusual bruising or bleeding Side effects that usually do not require medical attention (report to your care team if they continue or are bothersome): Dry skin Headache Increased tears Nausea Pain, redness, or swelling with sores inside the mouth or throat Sensitivity to light Vomiting This list may not describe all possible side effects. Call your doctor for medical advice about side effects. You may report side effects to FDA at 1-800-FDA-1088. Where should I keep my medication? This medication is given in a hospital or clinic. It will not be stored at home. NOTE: This sheet is a summary. It may not cover all possible information. If you have questions about this medicine, talk to your doctor, pharmacist, or health  care provider.  2024 Elsevier/Gold Standard (2021-07-12 00:00:00)

## 2022-09-07 NOTE — Progress Notes (Signed)
Okay to treat with ttl bili 1.6 per Dr. Mosetta Putt

## 2022-09-08 ENCOUNTER — Other Ambulatory Visit: Payer: Self-pay | Admitting: Physician Assistant

## 2022-09-09 ENCOUNTER — Inpatient Hospital Stay: Payer: Medicare Other

## 2022-09-09 VITALS — BP 114/53 | HR 73 | Temp 98.1°F | Resp 18

## 2022-09-09 DIAGNOSIS — Z5111 Encounter for antineoplastic chemotherapy: Secondary | ICD-10-CM | POA: Diagnosis not present

## 2022-09-09 DIAGNOSIS — C170 Malignant neoplasm of duodenum: Secondary | ICD-10-CM

## 2022-09-09 MED ORDER — HEPARIN SOD (PORK) LOCK FLUSH 100 UNIT/ML IV SOLN
500.0000 [IU] | Freq: Once | INTRAVENOUS | Status: AC | PRN
  Administered 2022-09-09: 500 [IU]

## 2022-09-09 MED ORDER — SODIUM CHLORIDE 0.9% FLUSH
10.0000 mL | INTRAVENOUS | Status: DC | PRN
  Administered 2022-09-09: 10 mL

## 2022-09-13 ENCOUNTER — Other Ambulatory Visit: Payer: Self-pay

## 2022-09-14 ENCOUNTER — Other Ambulatory Visit: Payer: Self-pay

## 2022-09-15 MED FILL — Dexamethasone Sodium Phosphate Inj 100 MG/10ML: INTRAMUSCULAR | Qty: 1 | Status: AC

## 2022-09-18 ENCOUNTER — Inpatient Hospital Stay: Payer: Medicare Other | Attending: Nurse Practitioner | Admitting: Adult Health

## 2022-09-18 ENCOUNTER — Inpatient Hospital Stay: Payer: Medicare Other

## 2022-09-18 ENCOUNTER — Other Ambulatory Visit: Payer: Self-pay

## 2022-09-18 ENCOUNTER — Telehealth: Payer: Self-pay | Admitting: Hematology

## 2022-09-18 ENCOUNTER — Encounter: Payer: Self-pay | Admitting: Adult Health

## 2022-09-18 VITALS — BP 159/6 | HR 89 | Temp 97.9°F | Resp 18 | Ht 66.0 in | Wt 205.1 lb

## 2022-09-18 DIAGNOSIS — Z79899 Other long term (current) drug therapy: Secondary | ICD-10-CM | POA: Diagnosis not present

## 2022-09-18 DIAGNOSIS — D701 Agranulocytosis secondary to cancer chemotherapy: Secondary | ICD-10-CM | POA: Diagnosis not present

## 2022-09-18 DIAGNOSIS — Z5189 Encounter for other specified aftercare: Secondary | ICD-10-CM | POA: Diagnosis not present

## 2022-09-18 DIAGNOSIS — D500 Iron deficiency anemia secondary to blood loss (chronic): Secondary | ICD-10-CM

## 2022-09-18 DIAGNOSIS — Z5111 Encounter for antineoplastic chemotherapy: Secondary | ICD-10-CM | POA: Diagnosis present

## 2022-09-18 DIAGNOSIS — Z87891 Personal history of nicotine dependence: Secondary | ICD-10-CM | POA: Diagnosis not present

## 2022-09-18 DIAGNOSIS — C170 Malignant neoplasm of duodenum: Secondary | ICD-10-CM | POA: Diagnosis present

## 2022-09-18 DIAGNOSIS — Z7982 Long term (current) use of aspirin: Secondary | ICD-10-CM | POA: Diagnosis not present

## 2022-09-18 DIAGNOSIS — T451X5A Adverse effect of antineoplastic and immunosuppressive drugs, initial encounter: Secondary | ICD-10-CM | POA: Insufficient documentation

## 2022-09-18 DIAGNOSIS — Z95828 Presence of other vascular implants and grafts: Secondary | ICD-10-CM

## 2022-09-18 DIAGNOSIS — D508 Other iron deficiency anemias: Secondary | ICD-10-CM | POA: Diagnosis present

## 2022-09-18 DIAGNOSIS — Z8546 Personal history of malignant neoplasm of prostate: Secondary | ICD-10-CM | POA: Diagnosis not present

## 2022-09-18 LAB — CMP (CANCER CENTER ONLY)
ALT: 13 U/L (ref 0–44)
AST: 15 U/L (ref 15–41)
Albumin: 3.1 g/dL — ABNORMAL LOW (ref 3.5–5.0)
Alkaline Phosphatase: 100 U/L (ref 38–126)
Anion gap: 5 (ref 5–15)
BUN: 15 mg/dL (ref 8–23)
CO2: 25 mmol/L (ref 22–32)
Calcium: 8.5 mg/dL — ABNORMAL LOW (ref 8.9–10.3)
Chloride: 111 mmol/L (ref 98–111)
Creatinine: 1.04 mg/dL (ref 0.61–1.24)
GFR, Estimated: >60 mL/min (ref >60)
Glucose, Bld: 122 mg/dL — ABNORMAL HIGH (ref 70–99)
Potassium: 3.4 mmol/L — ABNORMAL LOW (ref 3.5–5.1)
Sodium: 141 mmol/L (ref 135–145)
Total Bilirubin: 1.8 mg/dL — ABNORMAL HIGH (ref 0.3–1.2)
Total Protein: 6.4 g/dL — ABNORMAL LOW (ref 6.5–8.1)

## 2022-09-18 LAB — CBC WITH DIFFERENTIAL (CANCER CENTER ONLY)
Abs Immature Granulocytes: 0 10*3/uL (ref 0.00–0.07)
Basophils Absolute: 0 10*3/uL (ref 0.0–0.1)
Basophils Relative: 1 %
Eosinophils Absolute: 0.1 10*3/uL (ref 0.0–0.5)
Eosinophils Relative: 13 %
HCT: 32.1 % — ABNORMAL LOW (ref 39.0–52.0)
Hemoglobin: 10.9 g/dL — ABNORMAL LOW (ref 13.0–17.0)
Immature Granulocytes: 0 %
Lymphocytes Relative: 58 %
Lymphs Abs: 0.5 10*3/uL — ABNORMAL LOW (ref 0.7–4.0)
MCH: 32.7 pg (ref 26.0–34.0)
MCHC: 34 g/dL (ref 30.0–36.0)
MCV: 96.4 fL (ref 80.0–100.0)
Monocytes Absolute: 0.1 10*3/uL (ref 0.1–1.0)
Monocytes Relative: 11 %
Neutro Abs: 0.2 10*3/uL — CL (ref 1.7–7.7)
Neutrophils Relative %: 17 %
Platelet Count: 86 10*3/uL — ABNORMAL LOW (ref 150–400)
RBC: 3.33 MIL/uL — ABNORMAL LOW (ref 4.22–5.81)
RDW: 18.5 % — ABNORMAL HIGH (ref 11.5–15.5)
Smear Review: NORMAL
WBC Count: 0.9 10*3/uL — CL (ref 4.0–10.5)
nRBC: 0 % (ref 0.0–0.2)

## 2022-09-18 MED ORDER — SODIUM CHLORIDE 0.9% FLUSH
10.0000 mL | Freq: Once | INTRAVENOUS | Status: AC
  Administered 2022-09-18: 10 mL

## 2022-09-18 NOTE — Assessment & Plan Note (Signed)
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  He began treatment last week.  He is very tired.  In reviewing his labs his platelet count is 86 and his white blood cell count is 0.9.  His neutrophil count is pending however lab estimates it to be around 0.1-0.2.  I reviewed with [name] that he is at risk for neutropenic fever and he knows that if he develops want to call us because it is considered an emergency.  We discussed neutropenic precautions in detail.  Due to his neutropenia he will not receive chemotherapy today.  I will defer this about 1 week.  I sent Dr. Blake Divine a message to understand if she wants [place] to order growth factor for him such as Granix and also whether she wants [place] to go ahead and dose reduce his treatment.  [name] will return in 1 week for labs, follow-up, and his next treatment.

## 2022-09-18 NOTE — Progress Notes (Signed)
[facility] Cancer Follow up:    Corwin Levins, MD 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[facility]) Staging form: Small Intestine - Adenocarcinoma, AJCC 8th Edition - Clinical stage from 03/18/2021: Stage IIIA (cTX, cN1, cM0) - Signed by Malachy Mood, MD on 06/22/2021 Stage prefix: Initial diagnosis   SUMMARY OF ONCOLOGIC HISTORY: Oncology History Overview Note   Cancer Staging  Adenocarcinoma of duodenum (HCC) Staging form: Small Intestine - Adenocarcinoma, AJCC 8th Edition - Clinical stage from 03/18/2021: Stage IIIA (cTX, cN1, cM0) - Signed by Malachy Mood, MD on 06/22/2021 Stage prefix: Initial diagnosis     Adenocarcinoma of duodenum (HCC)  03/01/2021 Imaging   EXAM: ABDOMEN ULTRASOUND COMPLETE  IMPRESSION: 1. Simple appearing right renal cyst. 2. Otherwise negative abdominal ultrasound   03/18/2021 Procedure   Upper Endoscopy/Colonoscopy, Dr. Myrtie Neither  Upper Endoscopy Impression: - Normal esophagus. - A single submucosal papule (nodule) found in the stomach. - Duodenal mass. Biopsied. Concerning for malignancy.  Colonoscopy Impression: - Diverticulosis in the entire examined colon. - The examined portion of the ileum was normal. - Patent end-to-end colo-colonic anastomosis, characterized by healthy appearing mucosa. - Internal hemorrhoids. - No specimens collected.   03/18/2021 Initial Biopsy   Diagnosis Duodenum, Biopsy - INVASIVE MODERATE TO POORLY DIFFERENTIATED ADENOCARCINOMA   03/18/2021 Cancer Staging   Staging form: Small Intestine - Adenocarcinoma, AJCC 8th Edition - Clinical stage from 03/18/2021: Stage IIIA (cTX, cN1, cM0) - Signed by Malachy Mood, MD on 06/22/2021 Stage prefix: Initial diagnosis   03/22/2021 Initial Diagnosis   Adenocarcinoma of duodenum (HCC)   04/01/2021 Imaging   EXAM: CT CHEST, ABDOMEN, AND PELVIS WITH CONTRAST  IMPRESSION: 1. Circumferential wall thickening of the  first portion of the duodenum, in keeping with known primary malignancy. 2. Enlarged, hypodense lymph nodes anterior to the pancreatic head and in the portacaval station, concerning for nodal metastatic disease. 3. No other evidence of metastatic disease in the chest, abdomen, or pelvis. 4. Incidental note of a fluid attenuation lesion within the proximal pancreatic body measuring 1.9 x 1.4 cm, with prominence of the pancreatic duct distally, up to 0.6 cm. This lesion was present on remote prior examination dated 05/04/2009, and has slightly increased in size over a long period of time. This is consistent with a small IPMN and benign given indolent behavior over greater than 10 years. 5. Background of very fine centrilobular pulmonary nodules, most concentrated in the lung apices, most commonly seen in smoking-related respiratory bronchiolitis. 6. Coronary artery disease.   Aortic Atherosclerosis (ICD10-I70.0).    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 Of note, a variant of uncertain significance was detected in the ATM (p.A869G), BLM (c.800-3T>G), and SDHA (p.V632F) genes. Report date is 04/27/2021.  The CancerNext-Expanded gene panel offered by [facility] and includes sequencing, rearrangement, and RNA analysis for the following 77 genes: AIP, ALK, APC, ATM, AXIN2, BAP1, BARD1, BLM, BMPR1A, BRCA1, BRCA2, BRIP1, CDC73, CDH1, CDK4, CDKN1B, CDKN2A, CHEK2, CTNNA1, DICER1, FANCC, FH, FLCN, GALNT12, KIF1B, LZTR1, MAX, MEN1, MET, MLH1, MSH2, MSH3, MSH6, MUTYH, NBN, NF1, NF2, NTHL1, PALB2, PHOX2B, PMS2, POT1, PRKAR1A, PTCH1, PTEN, RAD51C, RAD51D, RB1, RECQL, RET, SDHA, SDHAF2, SDHB, SDHC, SDHD, SMAD4, SMARCA4, SMARCB1, SMARCE1, STK11, SUFU, TMEM127, TP53, TSC1, TSC2, VHL and XRCC2 (sequencing and deletion/duplication); EGFR, EGLN1, HOXB13, KIT, MITF, PDGFRA, POLD1, and POLE (sequencing  only); EPCAM and GREM1 (  deletion/duplication only).    05/11/2021 - 09/28/2021 Chemotherapy   Patient is on Treatment Plan : COLORECTAL Pembrolizumab (200) q21d     11/24/2021 - 05/03/2022 Chemotherapy   Patient is on Treatment Plan : COLORECTAL FOLFOX q14d x 4 months     02/20/2022 Imaging    IMPRESSION: 1. Distal stomach/duodenal mass is decreased in size when compared with prior exam. 2. Stable upper abdominal adenopathy. 3. 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 4. Aortic Atherosclerosis (ICD10-I70.0).   05/15/2022 Imaging    IMPRESSION: Improved masslike enlargement of the duodenal as well as fold nodularity and thickening along the stomach. In addition there are some small nodes identified in the upper abdomen which are similar overall compared to the study of December 2023   However there is a new spiculated nodule in the middle lobe which is worrisome. In addition there are some small low-attenuation liver lesions which are too small to completely characterize but were not clearly seen on the previous examination. Recommend further evaluation. For the liver lesions in particular a Eovist liver MRI may be of some benefit as clinically directed.   Persistent cystic lesion along the midbody of the pancreas with associated duct dilatation. A cystic neoplasm of the pancreas is possible.   06/06/2022 Imaging   MR Abdomen W Wo Contrast     IMPRESSION: 1. No definite evidence of hepatic metastatic disease. Scattered punctate foci of T2 hyperintensity throughout the liver are not associated with any suspicious enhancement or restricted diffusion and are favored to reflect small cysts or biliary hamartomas. Some of these are suboptimally characterized based on their small size, and given nonvisualization on older prior studies, attention on follow-up CT in  3-6 months recommended. 2. Unchanged residual irregular thickening of the walls of the distal stomach and proximal duodenum. 3. 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 This lesion is unchanged in size from recent CT, although has slowly enlarged from older prior examinations. Recommend follow-up abdominal MRI in 1 year. 4. Stable mild extrahepatic biliary dilatation status post cholecystectomy. 5. Aortic and branch vessel atherosclerosis, better seen on CT.   09/07/2022 -  Chemotherapy   Patient is on Treatment Plan : COLORECTAL FOLFIRI q14d       CURRENT THERAPY: FOLFIRI   INTERVAL HISTORY: VINCENZO WAMPLER 77 y.o. male returns for follow-up and evaluation prior to receiving his next every 2-week dose of FOLFIRI.  His white blood cell count is 0.9 today and his DIF is pending however lab estimates it to be around 0.1 neutrophil count.  He feels very tired but otherwise denies any issues such as fever chills cough shortness of breath funny heartbeats or any worsening symptoms.   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) 04/25/2021   TIA (transient ischemic attack) 04/18/2021   Left carotid artery stenosis 04/18/2021   Fall at home, initial encounter 04/18/2021   Adenocarcinoma of duodenum (HCC) 03/22/2021   Loss of weight 03/08/2021   Iron deficiency anemia 02/16/2021   Epigastric pain 02/16/2021   Abnormal LFTs 02/16/2021   Leg swelling 11/16/2020   Anemia 11/16/2020  Hypokalemia 07/07/2020   Vitamin D deficiency 07/07/2020   Dog bite 06/26/2020   Change in bowel habits 11/01/2019   Chronic right-sided low back pain without sciatica 07/02/2019    Skin lesion 12/24/2018   Cough 05/03/2018   Hyperglycemia 05/03/2018   Left ankle sprain 11/01/2017   Left knee pain 12/05/2016   Allergic rhinitis 07/29/2015   CAP (community acquired pneumonia) 04/01/2015   Abnormal urine findings 04/01/2015   UTI (urinary tract infection) 08/21/2013   Malignant neoplasm of prostate (HCC) 07/02/2013   Bilateral knee pain 08/29/2011   Bladder neck obstruction 08/29/2011   Erectile dysfunction 06/20/2011   Encounter for well adult exam with abnormal findings 12/14/2010   POSTHERPETIC NEURALGIA 06/08/2009   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  MEDICAL HISTORY: Past Medical History:  Diagnosis Date   Anemia 10/31/2021   Dx IDA   Carotid stenosis, left    Colon cancer (HCC) 2000   Coronary artery disease    per Dr. 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, volume 11.5 ml   Stroke (HCC) 04/18/2021   TIA (transient ischemic attack)     SURGICAL HISTORY: Past Surgical History:  Procedure Laterality Date   CHOLECYSTECTOMY N/A 10/19/2021   Procedure: CHOLECYSTECTOMY;  Surgeon: Almond Lint, MD;  Location: [facility] OR;  Service: General;  Laterality: N/A;   COLON SURGERY  2000   colon cancer   COLONOSCOPY     ENDARTERECTOMY Left 04/25/2021   Procedure: LEFT ENDARTERECTOMY CAROTID;  Surgeon: Cephus Shelling, MD;  Location[facility] OR;  Service:  Vascular;  Laterality: Left;   EUS     pancreatic cyst   KNEE ARTHROSCOPY  2007   LT- [facility]   LAPAROSCOPY N/A 10/19/2021   Procedure: LAPAROSCOPY DIAGNOSTIC;  Surgeon: Almond Lint, MD;  Location: [facility] OR;  Service: General;  Laterality: N/A;  GENERAL AND TAP BLOCK   PATCH ANGIOPLASTY Left 04/25/2021   Procedure: PATCH ANGIOPLASTY XENOSURE 1CM X 6CM;  Surgeon: Cephus Shelling, MD;  Location: [facility] OR;  Service: Vascular;  Laterality: Left;   PORTACATH PLACEMENT Left 11/17/2021   Procedure: PORT PLACEMENT;  Surgeon: Almond Lint, MD;  Location: [facility] OR;  Service: General;  Laterality: Left;   PROSTATE BIOPSY  05/23/13   gleason 7, volume 11.5 ml   WHIPPLE PROCEDURE N/A 10/19/2021   Procedure: ATTEMPTED WHIPPLE PROCEDURE  WITH GASTRODUODENAL BYPASS;  Surgeon: Almond Lint, MD;  Location: [facility] OR;  Service: General;  Laterality: N/A;  GENERAL AND TAP BLOCK    SOCIAL HISTORY: Social History   Socioeconomic History   Marital status: Married    Spouse name: Earley Abide   Number of children: 4   Years of education: Not on file   Highest education level: Not on file  Occupational History   Not on file  Tobacco Use   Smoking status:  Former    Packs/day: 1.00    Years: 22.00    Additional pack years: 0.00    Total pack years: 22.00    Types: Cigarettes    Quit date: 03/20/1988    Years since quitting: 34.5   Smokeless tobacco: Never  Vaping Use   Vaping Use: Never used  Substance and Sexual Activity   Alcohol use: Not Currently    Comment: rare   Drug use: No   Sexual activity: Not Currently  Other Topics Concern   Not on file  Social History Narrative   Not on file   Social Determinants of Health   Financial Resource Strain: Low Risk  (08/11/2020)   Overall Financial Resource Strain (CARDIA)    Difficulty of Paying Living Expenses: Not hard at all  Food Insecurity: No Food Insecurity (08/11/2020)   Hunger Vital Sign    Worried About Running Out of Food in the Last Year: Never true     Ran Out of Food in the Last Year: Never true  Transportation Needs: No Transportation Needs (08/11/2020)   PRAPARE - Administrator, Civil Service (Medical): No    Lack of Transportation (Non-Medical): No  Physical Activity: Sufficiently Active (08/11/2020)   Exercise Vital Sign    Days of Exercise per Week: 3 days    Minutes of Exercise per Session: 60 min  Stress: No Stress Concern Present (08/11/2020)   [employer] of Occupational Health - Occupational Stress Questionnaire    Feeling of Stress : Not at all  Social Connections: Socially Integrated (08/11/2020)   Social Connection and Isolation Panel [NHANES]    Frequency of Communication with Friends and Family: More than three times a week    Frequency of Social Gatherings with Friends and Family: More than three times a week    Attends Religious Services: 1 to 4 times per year    Active Member of [employer] or Organizations: Yes    Attends Banker Meetings: 1 to 4 times per year    Marital Status: Married  Catering manager Violence: Not At Risk (05/03/2018)   Humiliation, Afraid, Rape, and Kick questionnaire    Fear of Current or Ex-Partner: No    Emotionally Abused: No    Physically Abused: No    Sexually Abused: No    FAMILY HISTORY: Family History  Problem Relation Age of Onset   Cancer Mother        Liver? bladder?, dx. late 19s   Colon cancer Sister 42   Cancer Brother        Lung   Diabetes Brother    Cancer Maternal Aunt        unknown type   Breast cancer Maternal Grandmother        dx. >50   Cancer Daughter 53       colon   Esophageal cancer Neg Hx    Rectal cancer Neg Hx    Stomach cancer Neg Hx     Review of Systems  Constitutional:  Positive for fatigue. Negative for appetite change, chills, fever and unexpected weight change.  HENT:   Negative for hearing loss, lump/mass and trouble swallowing.   Eyes:  Negative for eye problems and icterus.  Respiratory:  Negative for chest  tightness, cough and shortness of breath.   Cardiovascular:  Negative for chest pain, leg swelling and palpitations.  Gastrointestinal:  Negative for abdominal distention, abdominal pain, constipation, diarrhea, nausea and vomiting.  Endocrine: Negative for hot flashes.  Genitourinary:  Negative for difficulty urinating.   Musculoskeletal:  Negative for arthralgias.  Skin:  Negative for itching and rash.  Neurological:  Negative for dizziness, extremity weakness, headaches and numbness.  Hematological:  Negative for adenopathy. Does not bruise/bleed easily.  Psychiatric/Behavioral:  Negative for depression. The patient is not nervous/anxious.     PHYSICAL EXAMINATION   Onc Performance Status - 09/18/22 0900       KPS SCALE   KPS % SCORE Normal activity with effort, some s/s of disease             Vitals:   09/18/22 0907  BP: (!) 159/6  Pulse: 89  Resp: 18  Temp: 97.9 F (36.6 C)  SpO2: 100%    Physical Exam Constitutional:      General: He is not in acute distress.    Appearance: Normal appearance. He is not toxic-appearing.  HENT:     Head: Normocephalic and atraumatic.     Mouth/Throat:     Mouth: Mucous membranes are moist.     Pharynx: Oropharynx is clear. No oropharyngeal exudate or posterior oropharyngeal erythema.  Eyes:     General: No scleral icterus. Cardiovascular:     Rate and Rhythm: Normal rate and regular rhythm.     Pulses: Normal pulses.     Heart sounds: Normal heart sounds.  Pulmonary:     Effort: Pulmonary effort is normal.     Breath sounds: Normal breath sounds.  Abdominal:     General: Abdomen is flat. Bowel sounds are normal. There is no distension.     Palpations: Abdomen is soft.     Tenderness: There is no abdominal tenderness.  Musculoskeletal:        General: No swelling.     Cervical back: Neck supple.  Lymphadenopathy:     Cervical: No cervical adenopathy.  Skin:    General: Skin is warm and dry.     Findings: No rash.   Neurological:     General: No focal deficit present.     Mental Status: He is alert.  Psychiatric:        Mood and Affect: Mood normal.        Behavior: Behavior normal.     LABORATORY DATA:  CBC    Component Value Date/Time   WBC 0.9 (LL) 09/18/2022 0832   WBC 8.5 10/31/2021 1211   RBC 3.33 (L) 09/18/2022 0832   HGB 10.9 (L) 09/18/2022 0832   HCT 32.1 (L) 09/18/2022 0832   PLT 86 (L) 09/18/2022 0832   MCV 96.4 09/18/2022 0832   MCH 32.7 09/18/2022 0832   MCHC 34.0 09/18/2022 0832   RDW 18.5 (H) 09/18/2022 0832   LYMPHSABS 0.5 (L) 09/18/2022 0832   MONOABS 0.1 09/18/2022 0832   EOSABS 0.1 09/18/2022 0832   BASOSABS 0.0 09/18/2022 0832    CMP     Component Value Date/Time   NA 141 09/18/2022 0832   K 3.4 (L) 09/18/2022 0832   CL 111 09/18/2022 0832   CO2 25 09/18/2022 0832   GLUCOSE 122 (H) 09/18/2022 0832   BUN 15 09/18/2022 0832   CREATININE 1.04 09/18/2022 0832   CALCIUM 8.5 (L) 09/18/2022 0832   PROT 6.4 (L) 09/18/2022 0832   ALBUMIN 3.1 (L) 09/18/2022 0832   AST 15 09/18/2022 0832   ALT 13 09/18/2022 0832   ALKPHOS 100 09/18/2022 0832   BILITOT 1.8 (H) 09/18/2022 0832   GFRNONAA >60 09/18/2022 0832   GFRAA >60 02/24/2019 0207  ASSESSMENT and THERAPY PLAN:   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  He began treatment last week.  He is very tired.  In reviewing his labs his platelet count is 86 and his white blood cell count is 0.9.  His neutrophil count is pending however lab estimates it to be around 0.1-0.2.  I reviewed with Pervis that he is at risk for neutropenic fever and he knows that if he develops want to call us because it is considered an emergency.  We discussed neutropenic precautions in detail.  Due to his neutropenia he will not receive chemotherapy today.  I will defer this about 1 week.  I sent Dr. Blake Divine a message to understand if she wants  [place] to order growth factor for him such as Granix and also whether she wants [place] to go ahead and dose reduce his treatment.  Rea will return in 1 week for labs, follow-up, and his next treatment.  All questions were answered. The patient knows to call the clinic with any problems, questions or concerns. We can certainly see the patient much sooner if necessary.  Total encounter time:20 minutes*in face-to-face visit time, chart review, lab review, care coordination, order entry, and documentation of the encounter time.  Lillard Anes, NP 09/18/22 10:33 AM Medical Oncology and Hematology [facility] [address] [place], [place] [address] Tel. [phone]    Fax. [phone]  *Total Encounter Time as defined by the Centers for Medicare and Medicaid Services includes, in addition to the face-to-face time of a patient visit (documented in the note above) non-face-to-face time: obtaining and reviewing outside history, ordering and reviewing medications, tests or procedures, care coordination (communications with other health care professionals or caregivers) and documentation in the medical record.

## 2022-09-19 ENCOUNTER — Other Ambulatory Visit: Payer: Self-pay

## 2022-09-20 ENCOUNTER — Ambulatory Visit: Payer: Medicare Other

## 2022-09-20 ENCOUNTER — Inpatient Hospital Stay: Payer: Medicare Other

## 2022-09-20 ENCOUNTER — Other Ambulatory Visit: Payer: Medicare Other

## 2022-09-20 ENCOUNTER — Ambulatory Visit: Payer: Medicare Other | Admitting: Hematology

## 2022-09-24 ENCOUNTER — Other Ambulatory Visit: Payer: Self-pay | Admitting: Hematology

## 2022-09-26 ENCOUNTER — Other Ambulatory Visit (HOSPITAL_COMMUNITY): Payer: Self-pay

## 2022-09-27 MED FILL — Dexamethasone Sodium Phosphate Inj 100 MG/10ML: INTRAMUSCULAR | Qty: 1 | Status: AC

## 2022-09-28 ENCOUNTER — Inpatient Hospital Stay: Payer: Medicare Other

## 2022-09-28 ENCOUNTER — Inpatient Hospital Stay: Payer: Medicare Other | Admitting: Dietician

## 2022-09-28 ENCOUNTER — Other Ambulatory Visit: Payer: Self-pay

## 2022-09-28 ENCOUNTER — Encounter: Payer: Self-pay | Admitting: Hematology

## 2022-09-28 ENCOUNTER — Inpatient Hospital Stay: Payer: Medicare Other | Admitting: Hematology

## 2022-09-28 VITALS — BP 141/58 | HR 72 | Temp 98.2°F | Resp 18 | Ht 66.0 in | Wt 208.4 lb

## 2022-09-28 DIAGNOSIS — Z95828 Presence of other vascular implants and grafts: Secondary | ICD-10-CM

## 2022-09-28 DIAGNOSIS — G620 Drug-induced polyneuropathy: Secondary | ICD-10-CM

## 2022-09-28 DIAGNOSIS — C170 Malignant neoplasm of duodenum: Secondary | ICD-10-CM | POA: Diagnosis not present

## 2022-09-28 DIAGNOSIS — Z5111 Encounter for antineoplastic chemotherapy: Secondary | ICD-10-CM | POA: Diagnosis not present

## 2022-09-28 DIAGNOSIS — D500 Iron deficiency anemia secondary to blood loss (chronic): Secondary | ICD-10-CM

## 2022-09-28 DIAGNOSIS — Z1509 Genetic susceptibility to other malignant neoplasm: Secondary | ICD-10-CM

## 2022-09-28 LAB — CBC WITH DIFFERENTIAL (CANCER CENTER ONLY)
Abs Immature Granulocytes: 0.04 10*3/uL (ref 0.00–0.07)
Basophils Absolute: 0 10*3/uL (ref 0.0–0.1)
Basophils Relative: 1 %
Eosinophils Absolute: 0.1 10*3/uL (ref 0.0–0.5)
Eosinophils Relative: 1 %
HCT: 34.1 % — ABNORMAL LOW (ref 39.0–52.0)
Hemoglobin: 11.6 g/dL — ABNORMAL LOW (ref 13.0–17.0)
Immature Granulocytes: 1 %
Lymphocytes Relative: 15 %
Lymphs Abs: 1 10*3/uL (ref 0.7–4.0)
MCH: 32.5 pg (ref 26.0–34.0)
MCHC: 34 g/dL (ref 30.0–36.0)
MCV: 95.5 fL (ref 80.0–100.0)
Monocytes Absolute: 0.6 10*3/uL (ref 0.1–1.0)
Monocytes Relative: 8 %
Neutro Abs: 5.1 10*3/uL (ref 1.7–7.7)
Neutrophils Relative %: 74 %
Platelet Count: 238 10*3/uL (ref 150–400)
RBC: 3.57 MIL/uL — ABNORMAL LOW (ref 4.22–5.81)
RDW: 19.2 % — ABNORMAL HIGH (ref 11.5–15.5)
WBC Count: 6.9 10*3/uL (ref 4.0–10.5)
nRBC: 0 % (ref 0.0–0.2)

## 2022-09-28 LAB — CMP (CANCER CENTER ONLY)
ALT: 13 U/L (ref 0–44)
AST: 19 U/L (ref 15–41)
Albumin: 3 g/dL — ABNORMAL LOW (ref 3.5–5.0)
Alkaline Phosphatase: 106 U/L (ref 38–126)
Anion gap: 4 — ABNORMAL LOW (ref 5–15)
BUN: 17 mg/dL (ref 8–23)
CO2: 28 mmol/L (ref 22–32)
Calcium: 8.9 mg/dL (ref 8.9–10.3)
Chloride: 110 mmol/L (ref 98–111)
Creatinine: 1.01 mg/dL (ref 0.61–1.24)
GFR, Estimated: >60 mL/min (ref >60)
Glucose, Bld: 121 mg/dL — ABNORMAL HIGH (ref 70–99)
Potassium: 3.6 mmol/L (ref 3.5–5.1)
Sodium: 142 mmol/L (ref 135–145)
Total Bilirubin: 0.9 mg/dL (ref 0.3–1.2)
Total Protein: 6.4 g/dL — ABNORMAL LOW (ref 6.5–8.1)

## 2022-09-28 LAB — CEA (IN HOUSE-CHCC): CEA (CHCC-In House): 6.06 ng/mL — ABNORMAL HIGH (ref 0.00–5.00)

## 2022-09-28 MED ORDER — SODIUM CHLORIDE 0.9 % IV SOLN
180.0000 mg/m2 | Freq: Once | INTRAVENOUS | Status: AC
  Administered 2022-09-28: 400 mg via INTRAVENOUS
  Filled 2022-09-28: qty 5

## 2022-09-28 MED ORDER — ATROPINE SULFATE 1 MG/ML IV SOLN
0.5000 mg | Freq: Once | INTRAVENOUS | Status: AC | PRN
  Administered 2022-09-28: 0.5 mg via INTRAVENOUS
  Filled 2022-09-28: qty 1

## 2022-09-28 MED ORDER — SODIUM CHLORIDE 0.9 % IV SOLN
400.0000 mg/m2 | Freq: Once | INTRAVENOUS | Status: AC
  Administered 2022-09-28: 844 mg via INTRAVENOUS
  Filled 2022-09-28: qty 42.2

## 2022-09-28 MED ORDER — SODIUM CHLORIDE 0.9 % IV SOLN: Freq: Once | INTRAVENOUS | Status: AC

## 2022-09-28 MED ORDER — SODIUM CHLORIDE 0.9% FLUSH: 10.0000 mL | INTRAVENOUS | Status: DC | PRN

## 2022-09-28 MED ORDER — PALONOSETRON HCL INJECTION 0.25 MG/5ML
0.2500 mg | Freq: Once | INTRAVENOUS | Status: AC
  Administered 2022-09-28: 0.25 mg via INTRAVENOUS
  Filled 2022-09-28: qty 5

## 2022-09-28 MED ORDER — SODIUM CHLORIDE 0.9% FLUSH
10.0000 mL | Freq: Once | INTRAVENOUS | Status: AC
  Administered 2022-09-28: 10 mL

## 2022-09-28 MED ORDER — SODIUM CHLORIDE 0.9 % IV SOLN
2400.0000 mg/m2 | INTRAVENOUS | Status: DC
  Administered 2022-09-28: 5000 mg via INTRAVENOUS
  Filled 2022-09-28: qty 100

## 2022-09-28 MED ORDER — SODIUM CHLORIDE 0.9 % IV SOLN
10.0000 mg | Freq: Once | INTRAVENOUS | Status: AC
  Administered 2022-09-28: 10 mg via INTRAVENOUS
  Filled 2022-09-28: qty 10
  Filled 2022-09-28: qty 1

## 2022-09-28 NOTE — Patient Instructions (Signed)
[facility]  Discharge Instructions: Thank you for choosing [facility] to provide your oncology and hematology care.   If you have a lab appointment with the Cancer Center, please go directly to the Cancer Center and check in at the registration area.   Wear comfortable clothing and clothing appropriate for easy access to any Portacath or PICC line.   We strive to give you quality time with your provider. You may need to reschedule your appointment if you arrive late (15 or more minutes).  Arriving late affects you and other patients whose appointments are after yours.  Also, if you miss three or more appointments without notifying the office, you may be dismissed from the clinic at the provider's discretion.      For prescription refill requests, have your pharmacy contact our office and allow 72 hours for refills to be completed.    Today you received the following chemotherapy and/or immunotherapy agents: Irinotecan and Fluorouracil       To help prevent nausea and vomiting after your treatment, we encourage you to take your nausea medication as directed.  BELOW ARE SYMPTOMS THAT SHOULD BE REPORTED IMMEDIATELY: *FEVER GREATER THAN 100.4 F (38 C) OR HIGHER *CHILLS OR SWEATING *NAUSEA AND VOMITING THAT IS NOT CONTROLLED WITH YOUR NAUSEA MEDICATION *UNUSUAL SHORTNESS OF BREATH *UNUSUAL BRUISING OR BLEEDING *URINARY PROBLEMS (pain or burning when urinating, or frequent urination) *BOWEL PROBLEMS (unusual diarrhea, constipation, pain near the anus) TENDERNESS IN MOUTH AND THROAT WITH OR WITHOUT PRESENCE OF ULCERS (sore throat, sores in mouth, or a toothache) UNUSUAL RASH, SWELLING OR PAIN  UNUSUAL VAGINAL DISCHARGE OR ITCHING   Items with * indicate a potential emergency and should be followed up as soon as possible or go to the Emergency Department if any problems should occur.  Please show the CHEMOTHERAPY ALERT CARD or IMMUNOTHERAPY  ALERT CARD at check-in to the Emergency Department and triage nurse.  Should you have questions after your visit or need to cancel or reschedule your appointment, please contact [facility]  Dept: [phone]  and follow the prompts.  Office hours are 8:00 a.m. to 4:30 p.m. Monday - Friday. Please note that voicemails left after 4:00 p.m. may not be returned until the following business day.  We are closed weekends and major holidays. You have access to a nurse at all times for urgent questions. Please call the main number to the clinic Dept: [phone] and follow the prompts.   For any non-urgent questions, you may also contact your provider using MyChart. We now offer e-Visits for anyone 18 and older to request care online for non-urgent symptoms. For details visit [email or web address].   Also download the MyChart app! Go to the app store, search "MyChart", open the app, select [facility], and log in with your MyChart username and password.  Irinotecan Injection What is this medication? IRINOTECAN (ir in oh TEE kan) treats some types of cancer. It works by slowing down the growth of cancer cells. This medicine may be used for other purposes; ask your health care provider or pharmacist if you have questions. COMMON BRAND NAME(S): Camptosar What should I tell my care team before I take this medication? They need to know if you have any of these conditions: Dehydration Diarrhea Infection, especially a viral infection, such as chickenpox, cold sores, herpes Liver disease Low blood cell levels (white cells, red cells, and platelets) Low levels of electrolytes, such   as calcium, magnesium, or potassium in your blood Recent or ongoing radiation An unusual or allergic reaction to irinotecan, other medications, foods, dyes, or preservatives If you or your partner are pregnant or trying to get pregnant Breast-feeding How should I use this medication? This  medication is injected into a vein. It is given by your care team in a hospital or clinic setting. Talk to your care team about the use of this medication in children. Special care may be needed. Overdosage: If you think you have taken too much of this medicine contact a poison control center or emergency room at once. NOTE: This medicine is only for you. Do not share this medicine with others. What if I miss a dose? Keep appointments for follow-up doses. It is important not to miss your dose. Call your care team if you are unable to keep an appointment. What may interact with this medication? Do not take this medication with any of the following: Cobicistat Itraconazole This medication may also interact with the following: Certain antibiotics, such as clarithromycin, rifampin, rifabutin Certain antivirals for HIV or AIDS Certain medications for fungal infections, such as ketoconazole, posaconazole, voriconazole Certain medications for seizures, such as carbamazepine, phenobarbital, phenytoin Gemfibrozil Nefazodone St. John's wort This list may not describe all possible interactions. Give your health care provider a list of all the medicines, herbs, non-prescription drugs, or dietary supplements you use. Also tell them if you smoke, drink alcohol, or use illegal drugs. Some items may interact with your medicine. What should I watch for while using this medication? Your condition will be monitored carefully while you are receiving this medication. You may need blood work while taking this medication. This medication may make you feel generally unwell. This is not uncommon as chemotherapy can affect healthy cells as well as cancer cells. Report any side effects. Continue your course of treatment even though you feel ill unless your care team tells you to stop. This medication can cause serious side effects. To reduce the risk, your care team may give you other medications to take before receiving  this one. Be sure to follow the directions from your care team. This medication may affect your coordination, reaction time, or judgement. Do not drive or operate machinery until you know how this medication affects you. Sit up or stand slowly to reduce the risk of dizzy or fainting spells. Drinking alcohol with this medication can increase the risk of these side effects. This medication may increase your risk of getting an infection. Call your care team for advice if you get a fever, chills, sore throat, or other symptoms of a cold or flu. Do not treat yourself. Try to avoid being around people who are sick. Avoid taking medications that contain aspirin, acetaminophen, ibuprofen, naproxen, or ketoprofen unless instructed by your care team. These medications may hide a fever. This medication may increase your risk to bruise or bleed. Call your care team if you notice any unusual bleeding. Be careful brushing or flossing your teeth or using a toothpick because you may get an infection or bleed more easily. If you have any dental work done, tell your dentist you are receiving this medication. Talk to your care team if you or your partner are pregnant or think either of you might be pregnant. This medication can cause serious birth defects if taken during pregnancy and for 6 months after the last dose. You will need a negative pregnancy test before starting this medication. Contraception is recommended while taking   this medication and for 6 months after the last dose. Your care team can help you find the option that works for you. Do not father a child while taking this medication and for 3 months after the last dose. Use a condom for contraception during this time period. Do not breastfeed while taking this medication and for 7 days after the last dose. This medication may cause infertility. Talk to your care team if you are concerned about your fertility. What side effects may I notice from receiving this  medication? Side effects that you should report to your care team as soon as possible: Allergic reactions--skin rash, itching, hives, swelling of the face, lips, tongue, or throat Dry cough, shortness of breath or trouble breathing Increased saliva or tears, increased sweating, stomach cramping, diarrhea, small pupils, unusual weakness or fatigue, slow heartbeat Infection--fever, chills, cough, sore throat, wounds that don't heal, pain or trouble when passing urine, general feeling of discomfort or being unwell Kidney injury--decrease in the amount of urine, swelling of the ankles, hands, or feet Low red blood cell level--unusual weakness or fatigue, dizziness, headache, trouble breathing Severe or prolonged diarrhea Unusual bruising or bleeding Side effects that usually do not require medical attention (report to your care team if they continue or are bothersome): Constipation Diarrhea Hair loss Loss of appetite Nausea Stomach pain This list may not describe all possible side effects. Call your doctor for medical advice about side effects. You may report side effects to FDA at 1-800-FDA-1088. Where should I keep my medication? This medication is given in a hospital or clinic. It will not be stored at home. NOTE: This sheet is a summary. It may not cover all possible information. If you have questions about this medicine, talk to your doctor, pharmacist, or health care provider.  2024 Elsevier/Gold Standard (2021-07-18 00:00:00)  Fluorouracil Injection What is this medication? FLUOROURACIL (flure oh YOOR a sil) treats some types of cancer. It works by slowing down the growth of cancer cells. This medicine may be used for other purposes; ask your health care provider or pharmacist if you have questions. COMMON BRAND NAME(S): Adrucil What should I tell my care team before I take this medication? They need to know if you have any of these conditions: Blood disorders Dihydropyrimidine  dehydrogenase (DPD) deficiency Infection, such as chickenpox, cold sores, herpes Kidney disease Liver disease Poor nutrition Recent or ongoing radiation therapy An unusual or allergic reaction to fluorouracil, other medications, foods, dyes, or preservatives If you or your partner are pregnant or trying to get pregnant Breast-feeding How should I use this medication? This medication is injected into a vein. It is administered by your care team in a hospital or clinic setting. Talk to your care team about the use of this medication in children. Special care may be needed. Overdosage: If you think you have taken too much of this medicine contact a poison control center or emergency room at once. NOTE: This medicine is only for you. Do not share this medicine with others. What if I miss a dose? Keep appointments for follow-up doses. It is important not to miss your dose. Call your care team if you are unable to keep an appointment. What may interact with this medication? Do not take this medication with any of the following: Live virus vaccines This medication may also interact with the following: Medications that treat or prevent blood clots, such as warfarin, enoxaparin, dalteparin This list may not describe all possible interactions. Give your health   care provider a list of all the medicines, herbs, non-prescription drugs, or dietary supplements you use. Also tell them if you smoke, drink alcohol, or use illegal drugs. Some items may interact with your medicine. What should I watch for while using this medication? Your condition will be monitored carefully while you are receiving this medication. This medication may make you feel generally unwell. This is not uncommon as chemotherapy can affect healthy cells as well as cancer cells. Report any side effects. Continue your course of treatment even though you feel ill unless your care team tells you to stop. In some cases, you may be given  additional medications to help with side effects. Follow all directions for their use. This medication may increase your risk of getting an infection. Call your care team for advice if you get a fever, chills, sore throat, or other symptoms of a cold or flu. Do not treat yourself. Try to avoid being around people who are sick. This medication may increase your risk to bruise or bleed. Call your care team if you notice any unusual bleeding. Be careful brushing or flossing your teeth or using a toothpick because you may get an infection or bleed more easily. If you have any dental work done, tell your dentist you are receiving this medication. Avoid taking medications that contain aspirin, acetaminophen, ibuprofen, naproxen, or ketoprofen unless instructed by your care team. These medications may hide a fever. Do not treat diarrhea with over the counter products. Contact your care team if you have diarrhea that lasts more than 2 days or if it is severe and watery. This medication can make you more sensitive to the sun. Keep out of the sun. If you cannot avoid being in the sun, wear protective clothing and sunscreen. Do not use sun lamps, tanning beds, or tanning booths. Talk to your care team if you or your partner wish to become pregnant or think you might be pregnant. This medication can cause serious birth defects if taken during pregnancy and for 3 months after the last dose. A reliable form of contraception is recommended while taking this medication and for 3 months after the last dose. Talk to your care team about effective forms of contraception. Do not father a child while taking this medication and for 3 months after the last dose. Use a condom while having sex during this time period. Do not breastfeed while taking this medication. This medication may cause infertility. Talk to your care team if you are concerned about your fertility. What side effects may I notice from receiving this  medication? Side effects that you should report to your care team as soon as possible: Allergic reactions--skin rash, itching, hives, swelling of the face, lips, tongue, or throat Heart attack--pain or tightness in the chest, shoulders, arms, or jaw, nausea, shortness of breath, cold or clammy skin, feeling faint or lightheaded Heart failure--shortness of breath, swelling of the ankles, feet, or hands, sudden weight gain, unusual weakness or fatigue Heart rhythm changes--fast or irregular heartbeat, dizziness, feeling faint or lightheaded, chest pain, trouble breathing High ammonia level--unusual weakness or fatigue, confusion, loss of appetite, nausea, vomiting, seizures Infection--fever, chills, cough, sore throat, wounds that don't heal, pain or trouble when passing urine, general feeling of discomfort or being unwell Low red blood cell level--unusual weakness or fatigue, dizziness, headache, trouble breathing Pain, tingling, or numbness in the hands or feet, muscle weakness, change in vision, confusion or trouble speaking, loss of balance or coordination, trouble walking, seizures   Redness, swelling, and blistering of the skin over hands and feet Severe or prolonged diarrhea Unusual bruising or bleeding Side effects that usually do not require medical attention (report to your care team if they continue or are bothersome): Dry skin Headache Increased tears Nausea Pain, redness, or swelling with sores inside the mouth or throat Sensitivity to light Vomiting This list may not describe all possible side effects. Call your doctor for medical advice about side effects. You may report side effects to FDA at 1-800-FDA-1088. Where should I keep my medication? This medication is given in a hospital or clinic. It will not be stored at home. NOTE: This sheet is a summary. It may not cover all possible information. If you have questions about this medicine, talk to your doctor, pharmacist, or health  care provider.  2024 Elsevier/Gold Standard (2021-07-12 00:00:00)  

## 2022-09-28 NOTE — Assessment & Plan Note (Signed)
diagnosed 02/2021 by endo-/colonoscopy for weight loss and anemia. Treatment delayed due to his stroke on 04/18/21. S/p 8 cycles neoadjuvant Keytruda 05/11/21 - 09/28/21. He tolerated well with mild fatigue.  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 -He began FOLFOX on 11/24/21. He has tolerated well overall with some taste changes and mild fatigue -restaging CT scan 02/20/2022 showed PR -restaging CT from 05/15/2022 showed improved masslike enlargement of the duodenal as well as fold nodularity and thickening along the stomach, however it showed a new liver lesion, indeterminate, will obtain liver MRI  -Patient complained of worsening fatigue, last cycle chemo was held on 05/18/22. -Liver MRI in March 2024 showed benign liver lesions and a cystic lesion in pancreas, no evidence of liver metastasis.   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  No evidence of distant metastasis. -Due to the cancer progression, and his persistent peripheral neuropathy, I recommend change treatment to second line FOLFIRI, he started on 6/20

## 2022-09-28 NOTE — Assessment & Plan Note (Signed)
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

## 2022-09-28 NOTE — Progress Notes (Signed)
[facility]   Telephone:(336) [phone] [phone]   Clinic Follow up Note   Patient Care Team: Corwin Levins, MD as PCP - General Jens Som, Madolyn Frieze, MD as PCP - Cardiology (Cardiology) Iva Boop, MD as Consulting Physician (Gastroenterology) Gean Birchwood, MD as Consulting Physician (Orthopedic Surgery) Sallye Lat, MD as Consulting Physician (Ophthalmology) Malachy Mood, MD as Consulting Physician (Oncology)  Date of Service:  09/28/2022  CHIEF COMPLAINT: f/u of Adenocarcinoma of duodenum   CURRENT THERAPY:  FOLFIRI q14d  ASSESSMENT:  Aaron Moss is a 77 y.o. male with   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 Treatment delayed due to his stroke on 04/18/21. S/p 8 cycles neoadjuvant Keytruda 05/11/21 - 09/28/21. He tolerated well with mild fatigue.  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 -He began FOLFOX on 11/24/21. He has tolerated well overall with some taste changes and mild fatigue -restaging CT scan 02/20/2022 showed PR -restaging CT from 05/15/2022 showed improved masslike enlargement of the duodenal as well as fold nodularity and thickening along the stomach, however it showed a new liver lesion, indeterminate, will obtain liver MRI  -Patient complained of worsening fatigue, last cycle chemo was held on 05/18/22. -Liver MRI in March 2024 showed benign liver lesions and a cystic lesion in pancreas, no evidence of liver metastasis.   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  No evidence of distant metastasis. -Due to the cancer progression, and his persistent  peripheral neuropathy, I recommend change treatment to second line FOLFIRI, he started on 6/20 -Due to significant neutropenia, her cycle 2 chemotherapy was postponed for week, proceed today with Udenyca on day 3. -He otherwise tolerated first cycle chemotherapy very well, will increase irinotecan to full dose today.  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 in February 2024 -Patient reports worsening numbness in his fingers and toes today, stable lately       PLAN: -add on Udenyca injection on day 3 -lab reviewed -proceed with C2 FOLFIRI today at full dose  -lab/flush and treatment 7/25   SUMMARY OF ONCOLOGIC HISTORY: Oncology History Overview Note   Cancer Staging  Adenocarcinoma of duodenum [facility]) Staging form: Small Intestine - Adenocarcinoma, AJCC 8th Edition - Clinical stage from 03/18/2021: Stage IIIA (cTX, cN1, cM0) - Signed by Malachy Mood, MD on 06/22/2021 Stage prefix: Initial diagnosis     Adenocarcinoma of duodenum (HCC)  03/01/2021 Imaging   EXAM: ABDOMEN ULTRASOUND COMPLETE  IMPRESSION: 1. Simple appearing right renal cyst. 2. Otherwise negative abdominal ultrasound   03/18/2021 Procedure   Upper Endoscopy/Colonoscopy, Dr. Myrtie Neither  Upper Endoscopy Impression: - Normal esophagus. - A single submucosal papule (nodule) found in the stomach. - Duodenal mass. Biopsied. Concerning for malignancy.  Colonoscopy Impression: - Diverticulosis in the entire examined colon. - The examined portion of the ileum was normal. - Patent end-to-end colo-colonic anastomosis, characterized by healthy appearing mucosa. - Internal hemorrhoids. - No specimens collected.   03/18/2021 Initial Biopsy   Diagnosis Duodenum, Biopsy - INVASIVE MODERATE TO POORLY DIFFERENTIATED ADENOCARCINOMA   03/18/2021 Cancer Staging   Staging form: Small Intestine - Adenocarcinoma, AJCC 8th Edition - Clinical stage from 03/18/2021: Stage IIIA (cTX, cN1, cM0) -  Signed by  Malachy Mood, MD on 06/22/2021 Stage prefix: Initial diagnosis   03/22/2021 Initial Diagnosis   Adenocarcinoma of duodenum (HCC)   04/01/2021 Imaging   EXAM: CT CHEST, ABDOMEN, AND PELVIS WITH CONTRAST  IMPRESSION: 1. Circumferential wall thickening of the first portion of the duodenum, in keeping with known primary malignancy. 2. Enlarged, hypodense lymph nodes anterior to the pancreatic head and in the portacaval station, concerning for nodal metastatic disease. 3. No other evidence of metastatic disease in the chest, abdomen, or pelvis. 4. Incidental note of a fluid attenuation lesion within the proximal pancreatic body measuring 1.9 x 1.4 cm, with prominence of the pancreatic duct distally, up to 0.6 cm. This lesion was present on remote prior examination dated 05/04/2009, and has slightly increased in size over a long period of time. This is consistent with a small IPMN and benign given indolent behavior over greater than 10 years. 5. Background of very fine centrilobular pulmonary nodules, most concentrated in the lung apices, most commonly seen in smoking-related respiratory bronchiolitis. 6. Coronary artery disease.   Aortic Atherosclerosis (ICD10-I70.0).    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 Of note, a variant of uncertain significance was detected in the ATM (p.A869G), BLM (c.800-3T>G), and SDHA (p.V632F) genes. Report date is 04/27/2021.  The CancerNext-Expanded gene panel offered by [facility] and includes sequencing, rearrangement, and RNA analysis for the following 77 genes: AIP, ALK, APC, ATM, AXIN2, BAP1, BARD1, BLM, BMPR1A, BRCA1, BRCA2, BRIP1, CDC73, CDH1, CDK4, CDKN1B, CDKN2A, CHEK2, CTNNA1, DICER1, FANCC, FH, FLCN, GALNT12, KIF1B, LZTR1, MAX, MEN1, MET, MLH1, MSH2, MSH3, MSH6, MUTYH, NBN, NF1, NF2, NTHL1, PALB2, PHOX2B, PMS2, POT1, PRKAR1A, PTCH1, PTEN,  RAD51C, RAD51D, RB1, RECQL, RET, SDHA, SDHAF2, SDHB, SDHC, SDHD, SMAD4, SMARCA4, SMARCB1, SMARCE1, STK11, SUFU, TMEM127, TP53, TSC1, TSC2, VHL and XRCC2 (sequencing and deletion/duplication); EGFR, EGLN1, HOXB13, KIT, MITF, PDGFRA, POLD1, and POLE (sequencing only); EPCAM and GREM1 (deletion/duplication only).    05/11/2021 - 09/28/2021 Chemotherapy   Patient is on Treatment Plan : COLORECTAL Pembrolizumab (200) q21d     11/24/2021 - 05/03/2022 Chemotherapy   Patient is on Treatment Plan : COLORECTAL FOLFOX q14d x 4 months     02/20/2022 Imaging    IMPRESSION: 1. Distal stomach/duodenal mass is decreased in size when compared with prior exam. 2. Stable upper abdominal adenopathy. 3. 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 4. Aortic Atherosclerosis (ICD10-I70.0).   05/15/2022 Imaging    IMPRESSION: Improved masslike enlargement of the duodenal as well as fold nodularity and thickening along the stomach. In addition there are some small nodes identified in the upper abdomen which are similar overall compared to the study of December 2023   However there is a new spiculated nodule in the middle lobe which is worrisome. In addition there are some small low-attenuation liver lesions which are too small to completely characterize but were not clearly seen on the previous examination. Recommend further evaluation. For the liver lesions in particular a Eovist liver MRI may be of some benefit as clinically directed.   Persistent cystic lesion along the midbody of the pancreas with associated duct dilatation. A cystic neoplasm of the pancreas is possible.   06/06/2022 Imaging   MR Abdomen W Wo Contrast     IMPRESSION: 1. No definite evidence of hepatic metastatic disease. Scattered  punctate foci of T2 hyperintensity throughout the liver are not associated with any  suspicious enhancement or restricted diffusion and are favored to reflect small cysts or biliary hamartomas. Some of these are suboptimally characterized based on their small size, and given nonvisualization on older prior studies, attention on follow-up CT in 3-6 months recommended. 2. Unchanged residual irregular thickening of the walls of the distal stomach and proximal duodenum. 3. 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 This lesion is unchanged in size from recent CT, although has slowly enlarged from older prior examinations. Recommend follow-up abdominal MRI in 1 year. 4. Stable mild extrahepatic biliary dilatation status post cholecystectomy. 5. Aortic and branch vessel atherosclerosis, better seen on CT.   09/07/2022 -  Chemotherapy   Patient is on Treatment Plan : COLORECTAL FOLFIRI q14d        INTERVAL HISTORY:  Aaron Moss is here for a follow up of  Adenocarcinoma of duodenum He was last seen by NP Mardella Layman on 09/18/2022. He presents to the clinic accompanied by wife. Pt state that he had some constipation. He also took Miralax for the symptoms. Pt still has some numbness in his finger tips. He has a problem tieing his shoes. Pt state that his appetite comes and goes. Pt goes out in the yard and walk.    All other systems were reviewed with the patient and are negative.  MEDICAL HISTORY:  Past Medical History:  Diagnosis Date   Anemia 10/31/2021   Dx IDA   Carotid stenosis, left    Colon cancer (HCC) 2000   Coronary artery disease    per Dr. 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   GERD (gastroesophageal reflux disease)    Glaucoma    History of chemotherapy 2000   colon cancer   History of radiation therapy 08/06/13- 10/03/13   prostate 7800 cGy 40 sessions, seminal vesicles 5600 cGy 40 sessions   Hyperlipidemia    Hypertension     Left knee DJD    Prostate cancer (HCC) 05/23/2013   gleason 3+4=7, volume 11.5 ml   Stroke (HCC) 04/18/2021   TIA (transient ischemic attack)     SURGICAL HISTORY: Past Surgical History:  Procedure Laterality Date   CHOLECYSTECTOMY N/A 10/19/2021   Procedure: CHOLECYSTECTOMY;  Surgeon: Almond Lint, MD;  Location: [facility] OR;  Service: General;  Laterality: N/A;   COLON SURGERY  2000   colon cancer   COLONOSCOPY     ENDARTERECTOMY Left 04/25/2021   Procedure: LEFT ENDARTERECTOMY CAROTID;  Surgeon: Cephus Shelling, MD;  Location: [facility] OR;  Service: Vascular;  Laterality: Left;   EUS     pancreatic cyst   KNEE ARTHROSCOPY  2007   LT- [facility]   LAPAROSCOPY N/A 10/19/2021   Procedure: LAPAROSCOPY DIAGNOSTIC;  Surgeon: Almond Lint, MD;  Location: [facility] OR;  Service: General;  Laterality: N/A;  GENERAL AND TAP BLOCK   PATCH ANGIOPLASTY Left 04/25/2021   Procedure: PATCH ANGIOPLASTY XENOSURE 1CM X 6CM;  Surgeon: Cephus Shelling, MD;  Location: [facility] OR;  Service: Vascular;  Laterality: Left;   PORTACATH PLACEMENT Left 11/17/2021   Procedure: PORT PLACEMENT;  Surgeon: Almond Lint, MD;  Location: [facility] OR;  Service: General;  Laterality: Left;   PROSTATE BIOPSY  05/23/13   gleason 7, volume 11.5 ml   WHIPPLE PROCEDURE N/A 10/19/2021   Procedure: ATTEMPTED WHIPPLE PROCEDURE  WITH GASTRODUODENAL BYPASS;  Surgeon: Almond Lint, MD;  Location: [facility] OR;  Service: General;  Laterality: N/A;  GENERAL AND TAP BLOCK    I have reviewed the social history and family history with the patient and they are unchanged from previous note.  ALLERGIES:  is allergic to lyrica [pregabalin].  MEDICATIONS:  Current Outpatient Medications  Medication Sig Dispense Refill   amLODipine (NORVASC) 10 MG tablet Take 1 tablet by mouth once daily 90 tablet 0   aspirin EC 81 MG EC tablet Take 1 tablet (81 mg total) by mouth daily. Swallow whole. 30 tablet 11   atorvastatin (LIPITOR) 40 MG tablet Take 1 tablet by mouth once daily  90 tablet 2   b complex vitamins capsule Take 1 capsule by mouth daily.     benazepril (LOTENSIN) 40 MG tablet Take 1 tablet by mouth once daily 90 tablet 0   carvedilol (COREG) 12.5 MG tablet TAKE 1 TABLET BY MOUTH 2 TIMES DAILY. 180 tablet 3   cyanocobalamin (VITAMIN B12) 500 MCG tablet Take 500 mcg by mouth daily.     DULoxetine (CYMBALTA) 30 MG capsule TAKE 1 CAPSULE BY MOUTH ONCE DAILY FOR 7 DAYS AND THEN TAKE 2 CAPSULES ONCE DAILY FOR 24 DAYS 55 capsule 0   FERREX 150 150 MG capsule Take 1 capsule by mouth twice daily 180 capsule 0   pantoprazole (PROTONIX) 40 MG tablet Take 1 tablet by mouth once daily (Patient not taking: Reported on 06/07/2022) 90 tablet 0   potassium chloride 20 MEQ TBCR Take 20 mEq by mouth 3 (three) times daily. 90 tablet 0   potassium chloride SA (KLOR-CON M20) 20 MEQ tablet Take 1 tablet by mouth 3 (three) times daily.     prochlorperazine (COMPAZINE) 10 MG tablet Take 1 tablet (10 mg total) by mouth every 6 (six) hours as needed for nausea or vomiting (Use for nausea and / or vomiting unresolved with ondansetron (Zofran).). 30 tablet 0   traMADol (ULTRAM) 50 MG tablet TAKE 1 TABLET BY MOUTH EVERY 12 HOURS AS NEEDED 60 tablet 2   Travoprost, BAK Free, (TRAVATAN) 0.004 % SOLN ophthalmic solution Place 1 drop into both eyes at bedtime.     VITAMIN D PO Take 2 tablets by mouth daily. Gummies     No current facility-administered medications for this visit.   Facility-Administered Medications Ordered in Other Visits  Medication Dose Route Frequency Provider Last Rate Last Admin   fluorouracil (ADRUCIL) 5,000 mg in sodium chloride 0.9 % 150 mL chemo infusion  2,400 mg/m2 (Treatment Plan Recorded) Intravenous 1 day or 1 dose Malachy Mood, MD   Infusion Verify at 09/28/22 1354   sodium chloride flush (NS) 0.9 % injection 10 mL  10 mL Intracatheter PRN Malachy Mood, MD        PHYSICAL EXAMINATION: ECOG PERFORMANCE STATUS: 1 - Symptomatic but completely ambulatory  Vitals:    09/28/22 1039  BP: (!) 141/58  Pulse: 72  Resp: 18  Temp: 98.2 F (36.8 C)  SpO2: 100%   Wt Readings from Last 3 Encounters:  09/28/22 208 lb 6.4 oz (94.5 kg)  09/18/22 205 lb 1.6 oz (93 kg)  09/07/22 207 lb 8 oz (94.1 kg)     GENERAL:alert, no distress and comfortable SKIN: skin color normal, no rashes or significant lesions EYES: normal, Conjunctiva are pink and non-injected, sclera clear  NEURO: alert & oriented x 3 with fluent speech  LABORATORY DATA:  I have reviewed the data as listed    Latest Ref Rng &  Units 09/28/2022    9:58 AM 09/18/2022    8:32 AM 09/07/2022   10:08 AM  CBC  WBC 4.0 - 10.5 K/uL 6.9  0.9  5.2   Hemoglobin 13.0 - 17.0 g/dL 16.1  09.6  04.5   Hematocrit 39.0 - 52.0 % 34.1  32.1  34.5   Platelets 150 - 400 K/uL 238  86  158         Latest Ref Rng & Units 09/28/2022    9:58 AM 09/18/2022    8:32 AM 09/07/2022   10:08 AM  CMP  Glucose 70 - 99 mg/dL 409  811  914   BUN 8 - 23 mg/dL 17  15  17    Creatinine 0.61 - 1.24 mg/dL 7.82  9.56  2.13   Sodium 135 - 145 mmol/L 142  141  142   Potassium 3.5 - 5.1 mmol/L 3.6  3.4  3.7   Chloride 98 - 111 mmol/L 110  111  111   CO2 22 - 32 mmol/L 28  25  27    Calcium 8.9 - 10.3 mg/dL 8.9  8.5  8.8   Total Protein 6.5 - 8.1 g/dL 6.4  6.4  5.9   Total Bilirubin 0.3 - 1.2 mg/dL 0.9  1.8  1.6   Alkaline Phos 38 - 126 U/L 106  100  100   AST 15 - 41 U/L 19  15  20    ALT 0 - 44 U/L 13  13  12        RADIOGRAPHIC STUDIES: I have personally reviewed the radiological images as listed and agreed with the findings in the report. No results found.    Orders Placed This Encounter  Procedures   CBC with Differential (Cancer Center Only)    Standing Status:   Future    Standing Expiration Date:   11/08/2023   CMP (Cancer Center only)    Standing Status:   Future    Standing Expiration Date:   11/08/2023   CBC with Differential (Cancer Center Only)    Standing Status:   Future    Standing Expiration Date:    11/22/2023   CMP (Cancer Center only)    Standing Status:   Future    Standing Expiration Date:   11/22/2023   All questions were answered. The patient knows to call the clinic with any problems, questions or concerns. No barriers to learning was detected. The total time spent in the appointment was 25 minutes.     Malachy Mood, MD 09/28/2022   Carolin Coy, CMA, am acting as scribe for Malachy Mood, MD.   I have reviewed the above documentation for accuracy and completeness, and I agree with the above.

## 2022-09-28 NOTE — Progress Notes (Signed)
Proceed with full dose irinotecan today per Dr Mosetta Putt

## 2022-09-28 NOTE — Progress Notes (Signed)
Nutrition Follow-up:   Patient with colorectal cancer. He is currently receiving second line Folfiri (start 6/20).  Met with patient in infusion. He reports appetite comes and goes. Patient recalls eating 3-4 small meals most days. Usually drinks an Ensure for breakfast. He ate [employer] roast beef sandwich with fruit for lunch. Reports baked chicken, mashed potatoes, green beans for supper. Patient taking miralax. He denies constipation, diarrhea, nausea, vomiting.    Medications: reviewed   Labs: albumin 3.0, glucose 121  Anthropometrics: Wt 208 lb 6.4 oz today - increased   7/1 - 205 lb 1.6 oz  6/20 - 207 lb 8 oz    NUTRITION DIAGNOSIS: Unintended wt loss improving    INTERVENTION:  Continue strategies for increasing calories and protein with small frequent meals/snacks Continue drinking Ensure Plus/equivalent 1-2/day - coupons provided Continue bowel regimen per MD    MONITORING, EVALUATION, GOAL: weight trends, intake   NEXT VISIT: To be scheduled as needed with treatment

## 2022-09-29 ENCOUNTER — Other Ambulatory Visit: Payer: Self-pay

## 2022-09-30 ENCOUNTER — Inpatient Hospital Stay: Payer: Medicare Other

## 2022-09-30 VITALS — BP 120/54 | HR 66 | Temp 97.4°F | Resp 20

## 2022-09-30 DIAGNOSIS — Z5111 Encounter for antineoplastic chemotherapy: Secondary | ICD-10-CM | POA: Diagnosis not present

## 2022-09-30 DIAGNOSIS — C170 Malignant neoplasm of duodenum: Secondary | ICD-10-CM

## 2022-09-30 MED ORDER — SODIUM CHLORIDE 0.9% FLUSH
10.0000 mL | INTRAVENOUS | Status: DC | PRN
  Administered 2022-09-30: 10 mL

## 2022-09-30 MED ORDER — PEGFILGRASTIM-CBQV 6 MG/0.6ML ~~LOC~~ SOSY
6.0000 mg | PREFILLED_SYRINGE | Freq: Once | SUBCUTANEOUS | Status: AC
  Administered 2022-09-30: 6 mg via SUBCUTANEOUS

## 2022-09-30 MED ORDER — HEPARIN SOD (PORK) LOCK FLUSH 100 UNIT/ML IV SOLN
500.0000 [IU] | Freq: Once | INTRAVENOUS | Status: AC | PRN
  Administered 2022-09-30: 500 [IU]

## 2022-10-04 ENCOUNTER — Inpatient Hospital Stay: Payer: Medicare Other

## 2022-10-04 ENCOUNTER — Inpatient Hospital Stay: Payer: Medicare Other | Admitting: Hematology

## 2022-10-04 ENCOUNTER — Inpatient Hospital Stay: Payer: Medicare Other | Admitting: Dietician

## 2022-10-06 ENCOUNTER — Other Ambulatory Visit: Payer: Self-pay | Admitting: Hematology

## 2022-10-11 MED FILL — Dexamethasone Sodium Phosphate Inj 100 MG/10ML: INTRAMUSCULAR | Qty: 1 | Status: AC

## 2022-10-11 NOTE — Assessment & Plan Note (Signed)
-  he has a personal history of stage pT3N0 colon cancer in 2000 and prostate cancer dx in 2015, s/p 8 weeks of IMRT with Dr. Murray. -genetic testing and counseling on 04/07/21 confirmed a PMS2 mutation, as well as VUS in ATM, BLM, and SDHA. 

## 2022-10-11 NOTE — Assessment & Plan Note (Signed)
diagnosed 02/2021 by endo-/colonoscopy for weight loss and anemia. Treatment delayed due to his stroke on 04/18/21. S/p 8 cycles neoadjuvant Keytruda 05/11/21 - 09/28/21. He tolerated well with mild fatigue.  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 -He began FOLFOX on 11/24/21. He has tolerated well overall with some taste changes and mild fatigue -restaging CT scan 02/20/2022 showed PR -restaging CT from 05/15/2022 showed improved masslike enlargement of the duodenal as well as fold nodularity and thickening along the stomach, however it showed a new liver lesion, indeterminate, will obtain liver MRI  -Patient complained of worsening fatigue, last cycle chemo was held on 05/18/22. -Liver MRI in March 2024 showed benign liver lesions and a cystic lesion in pancreas, no evidence of liver metastasis.   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  No evidence of distant metastasis. -Due to the cancer progression, and his persistent peripheral neuropathy, I recommend change treatment to second line FOLFIRI, he started on 09/07/22, he is tolerating well so far  -Due to his moderate fatigue, I reduced irinotecan dose slightly from C3

## 2022-10-11 NOTE — Assessment & Plan Note (Signed)
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

## 2022-10-12 ENCOUNTER — Inpatient Hospital Stay: Payer: Medicare Other | Admitting: Hematology

## 2022-10-12 ENCOUNTER — Encounter: Payer: Self-pay | Admitting: Hematology

## 2022-10-12 ENCOUNTER — Other Ambulatory Visit: Payer: Self-pay

## 2022-10-12 ENCOUNTER — Inpatient Hospital Stay: Payer: Medicare Other

## 2022-10-12 VITALS — BP 137/59 | HR 80 | Temp 98.6°F | Resp 15 | Ht 66.0 in | Wt 202.4 lb

## 2022-10-12 DIAGNOSIS — Z1509 Genetic susceptibility to other malignant neoplasm: Secondary | ICD-10-CM | POA: Diagnosis not present

## 2022-10-12 DIAGNOSIS — G620 Drug-induced polyneuropathy: Secondary | ICD-10-CM | POA: Diagnosis not present

## 2022-10-12 DIAGNOSIS — C170 Malignant neoplasm of duodenum: Secondary | ICD-10-CM

## 2022-10-12 DIAGNOSIS — D500 Iron deficiency anemia secondary to blood loss (chronic): Secondary | ICD-10-CM

## 2022-10-12 DIAGNOSIS — Z95828 Presence of other vascular implants and grafts: Secondary | ICD-10-CM

## 2022-10-12 DIAGNOSIS — Z5111 Encounter for antineoplastic chemotherapy: Secondary | ICD-10-CM | POA: Diagnosis not present

## 2022-10-12 LAB — CBC WITH DIFFERENTIAL (CANCER CENTER ONLY)
Abs Immature Granulocytes: 0.11 10*3/uL — ABNORMAL HIGH (ref 0.00–0.07)
Basophils Absolute: 0 10*3/uL (ref 0.0–0.1)
Basophils Relative: 0 %
Eosinophils Absolute: 0.3 10*3/uL (ref 0.0–0.5)
Eosinophils Relative: 4 %
HCT: 34.6 % — ABNORMAL LOW (ref 39.0–52.0)
Hemoglobin: 11.6 g/dL — ABNORMAL LOW (ref 13.0–17.0)
Immature Granulocytes: 1 %
Lymphocytes Relative: 14 %
Lymphs Abs: 1.1 10*3/uL (ref 0.7–4.0)
MCH: 31.3 pg (ref 26.0–34.0)
MCHC: 33.5 g/dL (ref 30.0–36.0)
MCV: 93.3 fL (ref 80.0–100.0)
Monocytes Absolute: 0.7 10*3/uL (ref 0.1–1.0)
Monocytes Relative: 9 %
Neutro Abs: 5.6 10*3/uL (ref 1.7–7.7)
Neutrophils Relative %: 72 %
Platelet Count: 149 10*3/uL — ABNORMAL LOW (ref 150–400)
RBC: 3.71 MIL/uL — ABNORMAL LOW (ref 4.22–5.81)
RDW: 17.9 % — ABNORMAL HIGH (ref 11.5–15.5)
WBC Count: 7.8 10*3/uL (ref 4.0–10.5)
nRBC: 1.4 % — ABNORMAL HIGH (ref 0.0–0.2)

## 2022-10-12 LAB — CMP (CANCER CENTER ONLY)
ALT: 20 U/L (ref 0–44)
AST: 20 U/L (ref 15–41)
Albumin: 3.2 g/dL — ABNORMAL LOW (ref 3.5–5.0)
Alkaline Phosphatase: 123 U/L (ref 38–126)
Anion gap: 4 — ABNORMAL LOW (ref 5–15)
BUN: 14 mg/dL (ref 8–23)
CO2: 28 mmol/L (ref 22–32)
Calcium: 9 mg/dL (ref 8.9–10.3)
Chloride: 114 mmol/L — ABNORMAL HIGH (ref 98–111)
Creatinine: 1.07 mg/dL (ref 0.61–1.24)
GFR, Estimated: >60 mL/min (ref >60)
Glucose, Bld: 98 mg/dL (ref 70–99)
Potassium: 3.4 mmol/L — ABNORMAL LOW (ref 3.5–5.1)
Sodium: 146 mmol/L — ABNORMAL HIGH (ref 135–145)
Total Bilirubin: 0.8 mg/dL (ref 0.3–1.2)
Total Protein: 6 g/dL — ABNORMAL LOW (ref 6.5–8.1)

## 2022-10-12 MED ORDER — ATROPINE SULFATE 1 MG/ML IV SOLN
0.5000 mg | Freq: Once | INTRAVENOUS | Status: DC | PRN
  Filled 2022-10-12: qty 1

## 2022-10-12 MED ORDER — SODIUM CHLORIDE 0.9% FLUSH
10.0000 mL | Freq: Once | INTRAVENOUS | Status: AC
  Administered 2022-10-12: 10 mL

## 2022-10-12 MED ORDER — SODIUM CHLORIDE 0.9 % IV SOLN
400.0000 mg/m2 | Freq: Once | INTRAVENOUS | Status: AC
  Administered 2022-10-12: 844 mg via INTRAVENOUS
  Filled 2022-10-12: qty 42.2

## 2022-10-12 MED ORDER — HEPARIN SOD (PORK) LOCK FLUSH 100 UNIT/ML IV SOLN: 500.0000 [IU] | Freq: Once | INTRAVENOUS | Status: DC | PRN

## 2022-10-12 MED ORDER — SODIUM CHLORIDE 0.9 % IV SOLN: Freq: Once | INTRAVENOUS | Status: AC

## 2022-10-12 MED ORDER — SODIUM CHLORIDE 0.9 % IV SOLN
10.0000 mg | Freq: Once | INTRAVENOUS | Status: AC
  Administered 2022-10-12: 10 mg via INTRAVENOUS
  Filled 2022-10-12: qty 10

## 2022-10-12 MED ORDER — SODIUM CHLORIDE 0.9% FLUSH: 10.0000 mL | INTRAVENOUS | Status: DC | PRN

## 2022-10-12 MED ORDER — SODIUM CHLORIDE 0.9 % IV SOLN
150.0000 mg/m2 | Freq: Once | INTRAVENOUS | Status: AC
  Administered 2022-10-12: 300 mg via INTRAVENOUS
  Filled 2022-10-12: qty 15

## 2022-10-12 MED ORDER — PALONOSETRON HCL INJECTION 0.25 MG/5ML
0.2500 mg | Freq: Once | INTRAVENOUS | Status: AC
  Administered 2022-10-12: 0.25 mg via INTRAVENOUS
  Filled 2022-10-12: qty 5

## 2022-10-12 MED ORDER — SODIUM CHLORIDE 0.9 % IV SOLN
2400.0000 mg/m2 | INTRAVENOUS | Status: DC
  Administered 2022-10-12: 5000 mg via INTRAVENOUS
  Filled 2022-10-12: qty 100

## 2022-10-12 NOTE — Patient Instructions (Signed)
Aaron [facility]  Discharge Instructions: Thank you for choosing [facility] to provide your oncology and hematology care.   If you have a lab appointment with the [facility], please go directly to the [facility] and check in at the registration area.   Wear comfortable clothing and clothing appropriate for easy access to any Portacath or PICC line.   We strive to give you quality time with your provider. You may need to reschedule your appointment if you arrive late (15 or more minutes).  Arriving late affects you and other patients whose appointments are after yours.  Also, if you miss three or more appointments without notifying the office, you may be dismissed from the clinic at the provider's discretion.      For prescription refill requests, have your pharmacy contact our office and allow 72 hours for refills to be completed.    Today you received the following chemotherapy and/or immunotherapy agents: Irinotecan, Leucovorin, Fluorouracil.       To help prevent nausea and vomiting after your treatment, we encourage you to take your nausea medication as directed.  BELOW ARE SYMPTOMS THAT SHOULD BE REPORTED IMMEDIATELY: *FEVER GREATER THAN 100.4 F (38 C) OR HIGHER *CHILLS OR SWEATING *NAUSEA AND VOMITING THAT IS NOT CONTROLLED WITH YOUR NAUSEA MEDICATION *UNUSUAL SHORTNESS OF BREATH *UNUSUAL BRUISING OR BLEEDING *URINARY PROBLEMS (pain or burning when urinating, or frequent urination) *BOWEL PROBLEMS (unusual diarrhea, constipation, pain near the anus) TENDERNESS IN MOUTH AND THROAT WITH OR WITHOUT PRESENCE OF ULCERS (sore throat, sores in mouth, or a toothache) UNUSUAL RASH, SWELLING OR PAIN  UNUSUAL VAGINAL DISCHARGE OR ITCHING   Items with * indicate a potential emergency and should be followed up as soon as possible or go to the Emergency Department if any problems should occur.  Please show the CHEMOTHERAPY ALERT CARD or  IMMUNOTHERAPY ALERT CARD at check-in to the Emergency Department and triage nurse.  Should you have questions after your visit or need to cancel or reschedule your appointment, please contact [facility]  Dept: [phone]  and follow the prompts.  Office hours are 8:00 a.m. to 4:30 p.m. Monday - Friday. Please note that voicemails left after 4:00 p.m. may not be returned until the following business day.  We are closed weekends and major holidays. You have access to a nurse at all times for urgent questions. Please call the main number to the clinic Dept: [phone] and follow the prompts.   For any non-urgent questions, you may also contact your provider using MyChart. We now offer e-Visits for anyone 25 and older to request care online for non-urgent symptoms. For details visit [email or web address].   Also download the MyChart app! Go to the app store, search "MyChart", open the app, select [facility], and log in with your MyChart username and password.

## 2022-10-12 NOTE — Progress Notes (Signed)
[facility]   [phone] [phone]   Clinic Follow up Note   Patient Care Team: Corwin Levins, MD as PCP - General Jens Som, Madolyn Frieze, MD as PCP - Cardiology (Cardiology) Iva Boop, MD as Consulting Physician (Gastroenterology) Gean Birchwood, MD as Consulting Physician (Orthopedic Surgery) Sallye Lat, MD as Consulting Physician (Ophthalmology) Malachy Mood, MD as Consulting Physician (Oncology)  Date of Service:  10/12/2022  CHIEF COMPLAINT: f/u of Adenocarcinoma of duodenum     CURRENT THERAPY:  FOLFIRI q14d   ASSESSMENT:  Aaron Moss is a 77 y.o. male with   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 Treatment delayed due to his stroke on 04/18/21. S/p 8 cycles neoadjuvant Keytruda 05/11/21 - 09/28/21. He tolerated well with mild fatigue.  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 -He began FOLFOX on 11/24/21. He has tolerated well overall with some taste changes and mild fatigue -restaging CT scan 02/20/2022 showed PR -restaging CT from 05/15/2022 showed improved masslike enlargement of the duodenal as well as fold nodularity and thickening along the stomach, however it showed a new liver lesion, indeterminate, will obtain liver MRI  -Patient complained of worsening fatigue, last cycle chemo was held on 05/18/22. -Liver MRI in March 2024 showed benign liver lesions and a cystic lesion in pancreas, no evidence of liver metastasis.   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  No evidence of distant metastasis. -Due to the cancer progression, and his persistent  peripheral neuropathy, I recommend change treatment to second line FOLFIRI, he started on 09/07/22, he is tolerating well so far  -Due to his moderate fatigue, I will reduce irinotecan dose slightly today  PMS2-related Lynch syndrome (HNPCC4) -he has a personal history of stage pT3N0 colon cancer in 2000 and prostate cancer dx in 2015, s/p 8 weeks of IMRT with Dr. Dayton Scrape. -genetic testing and counseling on 04/07/21 confirmed a PMS2 mutation, as well as VUS in ATM, BLM, and SDHA.  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 in February 2024 -Patient reports worsening numbness in his fingers and toes today, stable lately        PLAN: -lab reviewed - proceed with C3  FOLFIRI  -Irinotecan at  reduce dose due to fatigue and weight loss -lab/flush and treatment 8/8  SUMMARY OF ONCOLOGIC HISTORY: Oncology History Overview Note   Cancer Staging  Adenocarcinoma of duodenum (HCC) Staging form: Small Intestine - Adenocarcinoma, AJCC 8th Edition - Clinical stage from 03/18/2021: Stage IIIA (cTX, cN1, cM0) - Signed by Malachy Mood, MD on 06/22/2021 Stage prefix: Initial diagnosis     Adenocarcinoma of duodenum (HCC)  03/01/2021 Imaging   EXAM: ABDOMEN ULTRASOUND COMPLETE  IMPRESSION: 1. Simple appearing right renal cyst. 2. Otherwise negative abdominal ultrasound   03/18/2021 Procedure   Upper Endoscopy/Colonoscopy, Dr. Myrtie Neither  Upper Endoscopy Impression: - Normal esophagus. - A single submucosal papule (nodule) found in the stomach. - Duodenal mass. Biopsied. Concerning for malignancy.  Colonoscopy Impression: - Diverticulosis in the entire examined colon. - The examined portion of the ileum was normal. - Patent end-to-end colo-colonic anastomosis, characterized by healthy appearing mucosa. - Internal hemorrhoids. - No specimens collected.   03/18/2021  Initial Biopsy   Diagnosis Duodenum, Biopsy - INVASIVE MODERATE TO POORLY DIFFERENTIATED ADENOCARCINOMA    03/18/2021 Cancer Staging   Staging form: Small Intestine - Adenocarcinoma, AJCC 8th Edition - Clinical stage from 03/18/2021: Stage IIIA (cTX, cN1, cM0) - Signed by Malachy Mood, MD on 06/22/2021 Stage prefix: Initial diagnosis   03/22/2021 Initial Diagnosis   Adenocarcinoma of duodenum (HCC)   04/01/2021 Imaging   EXAM: CT CHEST, ABDOMEN, AND PELVIS WITH CONTRAST  IMPRESSION: 1. Circumferential wall thickening of the first portion of the duodenum, in keeping with known primary malignancy. 2. Enlarged, hypodense lymph nodes anterior to the pancreatic head and in the portacaval station, concerning for nodal metastatic disease. 3. No other evidence of metastatic disease in the chest, abdomen, or pelvis. 4. Incidental note of a fluid attenuation lesion within the proximal pancreatic body measuring 1.9 x 1.4 cm, with prominence of the pancreatic duct distally, up to 0.6 cm. This lesion was present on remote prior examination dated 05/04/2009, and has slightly increased in size over a long period of time. This is consistent with a small IPMN and benign given indolent behavior over greater than 10 years. 5. Background of very fine centrilobular pulmonary nodules, most concentrated in the lung apices, most commonly seen in smoking-related respiratory bronchiolitis. 6. Coronary artery disease.   Aortic Atherosclerosis (ICD10-I70.0).    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 Of note, a variant of uncertain significance was detected in the ATM (p.A869G), BLM (c.800-3T>G), and SDHA (p.V632F) genes. Report date is 04/27/2021.  The CancerNext-Expanded gene panel offered by [facility] and includes sequencing, rearrangement, and RNA analysis for the following 77 genes: AIP, ALK, APC, ATM, AXIN2, BAP1, BARD1, BLM, BMPR1A, BRCA1, BRCA2, BRIP1, CDC73, CDH1, CDK4, CDKN1B, CDKN2A, CHEK2, CTNNA1,  DICER1, FANCC, FH, FLCN, GALNT12, KIF1B, LZTR1, MAX, MEN1, MET, MLH1, MSH2, MSH3, MSH6, MUTYH, NBN, NF1, NF2, NTHL1, PALB2, PHOX2B, PMS2, POT1, PRKAR1A, PTCH1, PTEN, RAD51C, RAD51D, RB1, RECQL, RET, SDHA, SDHAF2, SDHB, SDHC, SDHD, SMAD4, SMARCA4, SMARCB1, SMARCE1, STK11, SUFU, TMEM127, TP53, TSC1, TSC2, VHL and XRCC2 (sequencing and deletion/duplication); EGFR, EGLN1, HOXB13, KIT, MITF, PDGFRA, POLD1, and POLE (sequencing only); EPCAM and GREM1 (deletion/duplication only).    05/11/2021 - 09/28/2021 Chemotherapy   Patient is on Treatment Plan : COLORECTAL Pembrolizumab (200) q21d     11/24/2021 - 05/03/2022 Chemotherapy   Patient is on Treatment Plan : COLORECTAL FOLFOX q14d x 4 months     02/20/2022 Imaging    IMPRESSION: 1. Distal stomach/duodenal mass is decreased in size when compared with prior exam. 2. Stable upper abdominal adenopathy. 3. 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 4. Aortic Atherosclerosis (ICD10-I70.0).   05/15/2022 Imaging    IMPRESSION: Improved masslike enlargement of the duodenal as well as fold nodularity and thickening along the stomach. In addition there are some small nodes identified in the upper abdomen which are similar overall compared to the study of December 2023   However there is a new spiculated nodule in the middle lobe which is worrisome. In addition there are some small low-attenuation liver lesions which are too small to completely characterize but were not clearly seen on the previous examination. Recommend further evaluation. For the liver lesions in particular a Eovist liver MRI may be of some benefit as clinically directed.   Persistent cystic lesion along the  midbody of the pancreas with associated duct dilatation. A cystic neoplasm of the pancreas is possible.   06/06/2022 Imaging   MR Abdomen W Wo Contrast      IMPRESSION: 1. No definite evidence of hepatic metastatic disease. Scattered punctate foci of T2 hyperintensity throughout the liver are not associated with any suspicious enhancement or restricted diffusion and are favored to reflect small cysts or biliary hamartomas. Some of these are suboptimally characterized based on their small size, and given nonvisualization on older prior studies, attention on follow-up CT in 3-6 months recommended. 2. Unchanged residual irregular thickening of the walls of the distal stomach and proximal duodenum. 3. 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 This lesion is unchanged in size from recent CT, although has slowly enlarged from older prior examinations. Recommend follow-up abdominal MRI in 1 year. 4. Stable mild extrahepatic biliary dilatation status post cholecystectomy. 5. Aortic and branch vessel atherosclerosis, better seen on CT.   09/07/2022 -  Chemotherapy   Patient is on Treatment Plan : COLORECTAL FOLFIRI q14d        INTERVAL HISTORY:  Aaron Moss is here for a follow up of Adenocarcinoma of duodenum . He was last seen by me on 09/28/2022. He presents to the clinic accompanied by wife. Pt state that his appetite is not so good. So he states he would be eating frequent small meals. Pt report it usually take a week to get appetite back but the las treatment it took a little longer. Pt state that he had a cramping pain in his abdomen. Pt reports that he drinks two bottles of Ensure a day.      All other systems were reviewed with the patient and are negative.  MEDICAL HISTORY:  Past Medical History:  Diagnosis Date   Anemia 10/31/2021   Dx IDA   Carotid stenosis, left    Colon cancer (HCC) 2000   Coronary artery disease    per Dr. 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   GERD (gastroesophageal  reflux disease)    Glaucoma    History of chemotherapy 2000   colon cancer   History of radiation therapy 08/06/13- 10/03/13   prostate 7800 cGy 40 sessions, seminal vesicles 5600 cGy 40 sessions   Hyperlipidemia    Hypertension    Left knee DJD    Prostate cancer (HCC) 05/23/2013   gleason 3+4=7, volume 11.5 ml   Stroke (HCC) 04/18/2021   TIA (transient ischemic attack)     SURGICAL HISTORY: Past Surgical History:  Procedure Laterality Date   CHOLECYSTECTOMY N/A 10/19/2021   Procedure: CHOLECYSTECTOMY;  Surgeon: Almond Lint, MD;  Location: [facility] OR;  Service: General;  Laterality: N/A;   COLON SURGERY  2000   colon cancer   COLONOSCOPY     ENDARTERECTOMY Left 04/25/2021   Procedure: LEFT ENDARTERECTOMY CAROTID;  Surgeon: Cephus Shelling, MD;  Location: [facility] OR;  Service: Vascular;  Laterality: Left;   EUS     pancreatic cyst   KNEE ARTHROSCOPY  2007   LT- [facility]   LAPAROSCOPY N/A 10/19/2021   Procedure: LAPAROSCOPY DIAGNOSTIC;  Surgeon: Almond Lint, MD;  Location: [facility] OR;  Service: General;  Laterality: N/A;  GENERAL AND TAP BLOCK   PATCH ANGIOPLASTY Left 04/25/2021   Procedure: PATCH ANGIOPLASTY XENOSURE 1CM X 6CM;  Surgeon: Cephus Shelling, MD;  Location[facility] OR;  Service: Vascular;  Laterality: Left;   PORTACATH PLACEMENT Left 11/17/2021   Procedure: PORT PLACEMENT;  Surgeon: Almond Lint, MD;  Location: [facility] OR;  Service: General;  Laterality: Left;   PROSTATE BIOPSY  05/23/13   gleason 7, volume 11.5 ml   WHIPPLE PROCEDURE N/A 10/19/2021   Procedure: ATTEMPTED WHIPPLE PROCEDURE  WITH GASTRODUODENAL BYPASS;  Surgeon: Almond Lint, MD;  Location: [facility] OR;  Service: General;  Laterality: N/A;  GENERAL AND TAP BLOCK    I have reviewed the social history and family history with the patient and they are unchanged from previous note.  ALLERGIES:  is allergic to lyrica [pregabalin].  MEDICATIONS:  Current Outpatient Medications  Medication Sig Dispense Refill   amLODipine  (NORVASC) 10 MG tablet Take 1 tablet by mouth once daily 90 tablet 0   aspirin EC 81 MG EC tablet Take 1 tablet (81 mg total) by mouth daily. Swallow whole. 30 tablet 11   atorvastatin (LIPITOR) 40 MG tablet Take 1 tablet by mouth once daily 90 tablet 2   b complex vitamins capsule Take 1 capsule by mouth daily.     benazepril (LOTENSIN) 40 MG tablet Take 1 tablet by mouth once daily 90 tablet 0   carvedilol (COREG) 12.5 MG tablet TAKE 1 TABLET BY MOUTH 2 TIMES DAILY. 180 tablet 3   cyanocobalamin (VITAMIN B12) 500 MCG tablet Take 500 mcg by mouth daily.     DULoxetine (CYMBALTA) 30 MG capsule TAKE 1 CAPSULE BY MOUTH ONCE DAILY FOR 7 DAYS AND THEN TAKE 2 CAPSULES ONCE DAILY FOR 24 DAYS 55 capsule 0   FERREX 150 150 MG capsule Take 1 capsule by mouth twice daily 180 capsule 0   pantoprazole (PROTONIX) 40 MG tablet Take 1 tablet by mouth once daily (Patient not taking: Reported on 06/07/2022) 90 tablet 0   potassium chloride 20 MEQ TBCR Take 20 mEq by mouth 3 (three) times daily. 90 tablet 0   potassium chloride SA (KLOR-CON M20) 20 MEQ tablet Take 1 tablet by mouth 3 (three) times daily.     prochlorperazine (COMPAZINE) 10 MG tablet Take 1 tablet (10 mg total) by mouth every 6 (six) hours as needed for nausea or vomiting (Use for nausea and / or vomiting unresolved with ondansetron (Zofran).). 30 tablet 0   traMADol (ULTRAM) 50 MG tablet TAKE 1 TABLET BY MOUTH EVERY 12 HOURS AS NEEDED 60 tablet 2   Travoprost, BAK Free, (TRAVATAN) 0.004 % SOLN ophthalmic solution Place 1 drop into both eyes at bedtime.     VITAMIN D PO Take 2 tablets by mouth daily. Gummies     No current facility-administered medications for this visit.   Facility-Administered Medications Ordered in Other Visits  Medication Dose Route Frequency Provider Last Rate Last Admin   atropine injection 0.5 mg  0.5 mg Intravenous Once PRN Malachy Mood, MD       fluorouracil (ADRUCIL) 5,000 mg in sodium chloride 0.9 % 150 mL chemo infusion   2,400 mg/m2 (Treatment Plan Recorded) Intravenous 1 day or 1 dose Malachy Mood, MD       heparin lock flush 100 unit/mL  500 Units Intracatheter Once PRN Malachy Mood, MD       irinotecan (CAMPTOSAR) 300 mg in sodium chloride 0.9 % 500 mL chemo infusion  150 mg/m2 (Treatment Plan Recorded) Intravenous Once Malachy Mood, MD       leucovorin 844 mg in sodium chloride 0.9 % 250 mL infusion  400 mg/m2 (Treatment Plan Recorded) Intravenous Once Malachy Mood, MD  sodium chloride flush (NS) 0.9 % injection 10 mL  10 mL Intracatheter PRN Malachy Mood, MD        PHYSICAL EXAMINATION: ECOG PERFORMANCE STATUS: 1 - Symptomatic but completely ambulatory  Vitals:   10/12/22 1022  BP: (!) 137/59  Pulse: 80  Resp: 15  Temp: 98.6 F (37 C)  SpO2: 100%   Wt Readings from Last 3 Encounters:  10/12/22 202 lb 6.4 oz (91.8 kg)  09/28/22 208 lb 6.4 oz (94.5 kg)  09/18/22 205 lb 1.6 oz (93 kg)     GENERAL:alert, no distress and comfortable SKIN: skin color normal, no rashes or significant lesions EYES: normal, Conjunctiva are pink and non-injected, sclera clear  NEURO: alert & oriented x 3 with fluent speech  LABORATORY DATA:  I have reviewed the data as listed    Latest Ref Rng & Units 10/12/2022    9:55 AM 09/28/2022    9:58 AM 09/18/2022    8:32 AM  CBC  WBC 4.0 - 10.5 K/uL 7.8  6.9  0.9   Hemoglobin 13.0 - 17.0 g/dL 40.9  81.1  91.4   Hematocrit 39.0 - 52.0 % 34.6  34.1  32.1   Platelets 150 - 400 K/uL 149  238  86         Latest Ref Rng & Units 10/12/2022    9:55 AM 09/28/2022    9:58 AM 09/18/2022    8:32 AM  CMP  Glucose 70 - 99 mg/dL 98  782  956   BUN 8 - 23 mg/dL 14  17  15    Creatinine 0.61 - 1.24 mg/dL 2.13  0.86  5.78   Sodium 135 - 145 mmol/L 146  142  141   Potassium 3.5 - 5.1 mmol/L 3.4  3.6  3.4   Chloride 98 - 111 mmol/L 114  110  111   CO2 22 - 32 mmol/L 28  28  25    Calcium 8.9 - 10.3 mg/dL 9.0  8.9  8.5   Total Protein 6.5 - 8.1 g/dL 6.0  6.4  6.4   Total Bilirubin 0.3 - 1.2  mg/dL 0.8  0.9  1.8   Alkaline Phos 38 - 126 U/L 123  106  100   AST 15 - 41 U/L 20  19  15    ALT 0 - 44 U/L 20  13  13        RADIOGRAPHIC STUDIES: I have personally reviewed the radiological images as listed and agreed with the findings in the report. No results found.    Orders Placed This Encounter  Procedures   CBC with Differential (Cancer Center Only)    Standing Status:   Future    Standing Expiration Date:   12/06/2023   CMP (Cancer Center only)    Standing Status:   Future    Standing Expiration Date:   12/06/2023   All questions were answered. The patient knows to call the clinic with any problems, questions or concerns. No barriers to learning was detected. The total time spent in the appointment was 25 minutes.     Malachy Mood, MD 10/12/2022   Carolin Coy, CMA, am acting as scribe for Malachy Mood, MD.   I have reviewed the above documentation for accuracy and completeness, and I agree with the above.

## 2022-10-14 ENCOUNTER — Inpatient Hospital Stay: Payer: Medicare Other

## 2022-10-14 ENCOUNTER — Other Ambulatory Visit: Payer: Self-pay

## 2022-10-14 VITALS — BP 135/54 | HR 65 | Temp 98.2°F | Resp 18

## 2022-10-14 DIAGNOSIS — Z5111 Encounter for antineoplastic chemotherapy: Secondary | ICD-10-CM | POA: Diagnosis not present

## 2022-10-14 DIAGNOSIS — C170 Malignant neoplasm of duodenum: Secondary | ICD-10-CM

## 2022-10-14 MED ORDER — HEPARIN SOD (PORK) LOCK FLUSH 100 UNIT/ML IV SOLN
500.0000 [IU] | Freq: Once | INTRAVENOUS | Status: AC | PRN
  Administered 2022-10-14: 500 [IU]

## 2022-10-14 MED ORDER — PEGFILGRASTIM-CBQV 6 MG/0.6ML ~~LOC~~ SOSY
6.0000 mg | PREFILLED_SYRINGE | Freq: Once | SUBCUTANEOUS | Status: AC
  Administered 2022-10-14: 6 mg via SUBCUTANEOUS

## 2022-10-14 MED ORDER — SODIUM CHLORIDE 0.9% FLUSH
10.0000 mL | INTRAVENOUS | Status: DC | PRN
  Administered 2022-10-14: 10 mL

## 2022-10-16 ENCOUNTER — Telehealth: Payer: Self-pay | Admitting: Internal Medicine

## 2022-10-16 MED ORDER — POTASSIUM CHLORIDE CRYS ER 20 MEQ PO TBCR: 20.0000 meq | EXTENDED_RELEASE_TABLET | Freq: Three times a day (TID) | ORAL | 2 refills | Status: DC

## 2022-10-16 NOTE — Telephone Encounter (Signed)
Next OV is 10/19/2022.   Prescription Request  10/16/2022  LOV: 05/22/2022  What is the name of the medication or equipment? potassium chloride SA (KLOR-CON M20) 20 MEQ tablet   Have you contacted your pharmacy to request a refill? Yes   Which pharmacy would you like this sent to?  [id number] - [place] ([place]), [place] - [address] [place] ([place]) [place] [address] Phone: [phone] Fax: [phone]    Patient notified that their request is being sent to the clinical staff for review and that they should receive a response within 2 business days.   Please advise at Mobile [phone] (mobile)

## 2022-10-16 NOTE — Telephone Encounter (Signed)
Refill sent to [facility].../lmb 

## 2022-10-18 ENCOUNTER — Ambulatory Visit: Payer: Medicare Other

## 2022-10-18 ENCOUNTER — Ambulatory Visit: Payer: Medicare Other | Admitting: Hematology

## 2022-10-18 ENCOUNTER — Encounter: Payer: Self-pay | Admitting: Hematology

## 2022-10-18 ENCOUNTER — Encounter: Payer: Self-pay | Admitting: Nurse Practitioner

## 2022-10-18 ENCOUNTER — Other Ambulatory Visit: Payer: Medicare Other

## 2022-10-18 NOTE — Progress Notes (Signed)
This encounter was created in error - please disregard.

## 2022-10-19 ENCOUNTER — Other Ambulatory Visit: Payer: Self-pay | Admitting: Internal Medicine

## 2022-10-19 ENCOUNTER — Ambulatory Visit (INDEPENDENT_AMBULATORY_CARE_PROVIDER_SITE_OTHER): Payer: Medicare Other

## 2022-10-19 ENCOUNTER — Ambulatory Visit: Payer: Medicare Other | Admitting: Internal Medicine

## 2022-10-19 ENCOUNTER — Encounter: Payer: Self-pay | Admitting: Internal Medicine

## 2022-10-19 VITALS — BP 158/60 | HR 70 | Temp 98.5°F | Ht 66.0 in | Wt 202.0 lb

## 2022-10-19 DIAGNOSIS — Z0000 Encounter for general adult medical examination without abnormal findings: Secondary | ICD-10-CM | POA: Diagnosis not present

## 2022-10-19 DIAGNOSIS — R351 Nocturia: Secondary | ICD-10-CM | POA: Diagnosis not present

## 2022-10-19 DIAGNOSIS — E7800 Pure hypercholesterolemia, unspecified: Secondary | ICD-10-CM

## 2022-10-19 DIAGNOSIS — I10 Essential (primary) hypertension: Secondary | ICD-10-CM

## 2022-10-19 DIAGNOSIS — E559 Vitamin D deficiency, unspecified: Secondary | ICD-10-CM

## 2022-10-19 DIAGNOSIS — R739 Hyperglycemia, unspecified: Secondary | ICD-10-CM

## 2022-10-19 DIAGNOSIS — C61 Malignant neoplasm of prostate: Secondary | ICD-10-CM

## 2022-10-19 LAB — URINALYSIS, ROUTINE W REFLEX MICROSCOPIC
Bilirubin Urine: NEGATIVE
Ketones, ur: NEGATIVE
Nitrite: NEGATIVE
Specific Gravity, Urine: 1.01 (ref 1.000–1.030)
Urine Glucose: NEGATIVE
Urobilinogen, UA: 0.2 (ref 0.0–1.0)
pH: 6 (ref 5.0–8.0)

## 2022-10-19 MED ORDER — CIPROFLOXACIN HCL 500 MG PO TABS: 500.0000 mg | ORAL_TABLET | Freq: Two times a day (BID) | ORAL | 0 refills | Status: DC

## 2022-10-19 NOTE — Patient Instructions (Signed)
[name] , Thank you for taking time to come for your Medicare Wellness Visit. I appreciate your ongoing commitment to your health goals. Please review the following plan we discussed and let me know if I can assist you in the future.   Referrals/Orders/Follow-Ups/Clinician Recommendations: No  This is a list of the screening recommended for you and due dates:  Health Maintenance  Topic Date Due   Zoster (Shingles) Vaccine (1 of 2) 12/12/1964   Flu Shot  10/19/2022   COVID-19 Vaccine (6 - 2023-24 season) 11/04/2022*   Medicare Annual Wellness Visit  10/19/2023   Colon Cancer Screening  03/18/2026   DTaP/Tdap/Td vaccine (4 - Td or Tdap) 06/22/2030   Pneumonia Vaccine  Completed   Hepatitis C Screening  Completed   HPV Vaccine  Aged Out  *Topic was postponed. The date shown is not the original due date.    Advanced directives: (Copy Requested) Please bring a copy of your health care power of attorney and living will to the office to be added to your chart at your convenience.  Next Medicare Annual Wellness Visit scheduled for next year: No  Preventive Care 79 Years and Older, Male  Preventive care refers to lifestyle choices and visits with your health care provider that can promote health and wellness. What does preventive care include? A yearly physical exam. This is also called an annual well check. Dental exams once or twice a year. Routine eye exams. Ask your health care provider how often you should have your eyes checked. Personal lifestyle choices, including: Daily care of your teeth and gums. Regular physical activity. Eating a healthy diet. Avoiding tobacco and drug use. Limiting alcohol use. Practicing safe sex. Taking low doses of aspirin every day. Taking vitamin and mineral supplements as recommended by your health care provider. What happens during an annual well check? The services and screenings done by your health care provider during your annual well check  will depend on your age, overall health, lifestyle risk factors, and family history of disease. Counseling  Your health care provider may ask you questions about your: Alcohol use. Tobacco use. Drug use. Emotional well-being. Home and relationship well-being. Sexual activity. Eating habits. History of falls. Memory and ability to understand (cognition). Work and work Astronomer. Screening  You may have the following tests or measurements: Height, weight, and BMI. Blood pressure. Lipid and cholesterol levels. These may be checked every 5 years, or more frequently if you are over 79 years old. Skin check. Lung cancer screening. You may have this screening every year starting at age 79 if you have a 30-pack-year history of smoking and currently smoke or have quit within the past 15 years. Fecal occult blood test (FOBT) of the stool. You may have this test every year starting at age 79. Flexible sigmoidoscopy or colonoscopy. You may have a sigmoidoscopy every 5 years or a colonoscopy every 10 years starting at age 79. Prostate cancer screening. Recommendations will vary depending on your family history and other risks. Hepatitis C blood test. Hepatitis B blood test. Sexually transmitted disease (STD) testing. Diabetes screening. This is done by checking your blood sugar (glucose) after you have not eaten for a while (fasting). You may have this done every 1-3 years. Abdominal aortic aneurysm (AAA) screening. You may need this if you are a current or former smoker. Osteoporosis. You may be screened starting at age 79 if you are at high risk. Talk with your health care provider about your test results,  treatment options, and if necessary, the need for more tests. Vaccines  Your health care provider may recommend certain vaccines, such as: Influenza vaccine. This is recommended every year. Tetanus, diphtheria, and acellular pertussis (Tdap, Td) vaccine. You may need a Td booster every 10  years. Zoster vaccine. You may need this after age 79. Pneumococcal 13-valent conjugate (PCV13) vaccine. One dose is recommended after age 79. Pneumococcal polysaccharide (PPSV23) vaccine. One dose is recommended after age 79. Talk to your health care provider about which screenings and vaccines you need and how often you need them. This information is not intended to replace advice given to you by your health care provider. Make sure you discuss any questions you have with your health care provider. Document Released: 04/02/2015 Document Revised: 11/24/2015 Document Reviewed: 01/05/2015 Elsevier Interactive Patient Education  2017 [employer].  Fall Prevention in the Home Falls can cause injuries. They can happen to people of all ages. There are many things you can do to make your home safe and to help prevent falls. What can I do on the outside of my home? Regularly fix the edges of walkways and driveways and fix any cracks. Remove anything that might make you trip as you walk through a door, such as a raised step or threshold. Trim any bushes or trees on the path to your home. Use bright outdoor lighting. Clear any walking paths of anything that might make someone trip, such as rocks or tools. Regularly check to see if handrails are loose or broken. Make sure that both sides of any steps have handrails. Any raised decks and porches should have guardrails on the edges. Have any leaves, snow, or ice cleared regularly. Use sand or salt on walking paths during winter. Clean up any spills in your garage right away. This includes oil or grease spills. What can I do in the bathroom? Use night lights. Install grab bars by the toilet and in the tub and shower. Do not use towel bars as grab bars. Use non-skid mats or decals in the tub or shower. If you need to sit down in the shower, use a plastic, non-slip stool. Keep the floor dry. Clean up any water that spills on the floor as soon as it  happens. Remove soap buildup in the tub or shower regularly. Attach bath mats securely with double-sided non-slip rug tape. Do not have throw rugs and other things on the floor that can make you trip. What can I do in the bedroom? Use night lights. Make sure that you have a light by your bed that is easy to reach. Do not use any sheets or blankets that are too big for your bed. They should not hang down onto the floor. Have a firm chair that has side arms. You can use this for support while you get dressed. Do not have throw rugs and other things on the floor that can make you trip. What can I do in the kitchen? Clean up any spills right away. Avoid walking on wet floors. Keep items that you use a lot in easy-to-reach places. If you need to reach something above you, use a strong step stool that has a grab bar. Keep electrical cords out of the way. Do not use floor polish or wax that makes floors slippery. If you must use wax, use non-skid floor wax. Do not have throw rugs and other things on the floor that can make you trip. What can I do with my stairs? Do  not leave any items on the stairs. Make sure that there are handrails on both sides of the stairs and use them. Fix handrails that are broken or loose. Make sure that handrails are as long as the stairways. Check any carpeting to make sure that it is firmly attached to the stairs. Fix any carpet that is loose or worn. Avoid having throw rugs at the top or bottom of the stairs. If you do have throw rugs, attach them to the floor with carpet tape. Make sure that you have a light switch at the top of the stairs and the bottom of the stairs. If you do not have them, ask someone to add them for you. What else can I do to help prevent falls? Wear shoes that: Do not have high heels. Have rubber bottoms. Are comfortable and fit you well. Are closed at the toe. Do not wear sandals. If you use a stepladder: Make sure that it is fully opened.  Do not climb a closed stepladder. Make sure that both sides of the stepladder are locked into place. Ask someone to hold it for you, if possible. Clearly mark and make sure that you can see: Any grab bars or handrails. First and last steps. Where the edge of each step is. Use tools that help you move around (mobility aids) if they are needed. These include: Canes. Walkers. Scooters. Crutches. Turn on the lights when you go into a dark area. Replace any light bulbs as soon as they burn out. Set up your furniture so you have a clear path. Avoid moving your furniture around. If any of your floors are uneven, fix them. If there are any pets around you, be aware of where they are. Review your medicines with your doctor. Some medicines can make you feel dizzy. This can increase your chance of falling. Ask your doctor what other things that you can do to help prevent falls. This information is not intended to replace advice given to you by your health care provider. Make sure you discuss any questions you have with your health care provider. Document Released: 12/31/2008 Document Revised: 08/12/2015 Document Reviewed: 04/10/2014 Elsevier Interactive Patient Education  2017 [employer].

## 2022-10-19 NOTE — Assessment & Plan Note (Signed)
Last vitamin D Lab Results  Component Value Date   VD25OH 33.75 05/22/2022   Low, to start oral replacement

## 2022-10-19 NOTE — Progress Notes (Signed)
Patient ID: Aaron Moss, male   DOB: 1945-08-05, 77 y.o.   MRN: [id number]        Chief Complaint: follow up nocturia , hx of prostate ca, low vit d, hld, htn, hyperglycemia,        HPI:  Aaron Moss is a 77 y.o. male here with hx of prostate ca s/p xrt more then 5 yrs ago, now with worseing nocturia x 1-2 months, even up to 8 times per night.  Denies urinary symptoms such as dysuria, urgency, flank pain, hematuria or n/v, fever, chills.  Pt denies chest pain, increased sob or doe, wheezing, orthopnea, PND, increased LE swelling, palpitations, dizziness or syncope.  Pt denies polydipsia, polyuria, or new focal neuro s/s.   No alcohol use recent.  Conts to have ongoing successfl wt loss intentional, Peak wt had been 290 in the past just prior to retirement 2008, slowly losing since then with more walking.  Wt Readings from Last 3 Encounters:  10/19/22 202 lb (91.6 kg)  10/19/22 202 lb (91.6 kg)  10/12/22 202 lb 6.4 oz (91.8 kg)   BP Readings from Last 3 Encounters:  10/19/22 (!) 158/60  10/19/22 (!) 158/60  10/14/22 (!) 135/54         Past Medical History:  Diagnosis Date   Anemia 10/31/2021   Dx IDA   Carotid stenosis, left    Colon cancer (HCC) 2000   Coronary artery disease    per Dr. 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, volume 11.5 ml   Stroke (HCC) 04/18/2021   TIA (transient ischemic attack)    Past Surgical History:  Procedure Laterality Date   CHOLECYSTECTOMY N/A 10/19/2021   Procedure: CHOLECYSTECTOMY;  Surgeon: Almond Lint, MD;  Location: [facility] OR;  Service: General;  Laterality: N/A;   COLON  SURGERY  2000   colon cancer   COLONOSCOPY     ENDARTERECTOMY Left 04/25/2021   Procedure: LEFT ENDARTERECTOMY CAROTID;  Surgeon: Cephus Shelling, MD;  Location: [facility] OR;  Service: Vascular;  Laterality: Left;   EUS     pancreatic cyst   KNEE ARTHROSCOPY  2007   LT- [facility]   LAPAROSCOPY N/A 10/19/2021   Procedure: LAPAROSCOPY DIAGNOSTIC;  Surgeon: Almond Lint, MD;  Location: [facility] OR;  Service: General;  Laterality: N/A;  GENERAL AND TAP BLOCK   PATCH ANGIOPLASTY Left 04/25/2021   Procedure: PATCH ANGIOPLASTY XENOSURE 1CM X 6CM;  Surgeon: Cephus Shelling, MD;  Location: [facility] OR;  Service: Vascular;  Laterality: Left;   PORTACATH PLACEMENT Left 11/17/2021   Procedure: PORT PLACEMENT;  Surgeon: Almond Lint, MD;  Location: [facility] OR;  Service: General;  Laterality: Left;   PROSTATE BIOPSY  05/23/13   gleason 7, volume 11.5 ml   WHIPPLE PROCEDURE N/A 10/19/2021   Procedure: ATTEMPTED WHIPPLE PROCEDURE  WITH GASTRODUODENAL BYPASS;  Surgeon: Almond Lint, MD;  Location: [facility] OR;  Service: General;  Laterality: N/A;  GENERAL AND TAP BLOCK    reports that he quit smoking about 34 years ago. His smoking use included cigarettes. He  started smoking about 56 years ago. He has a 22 pack-year smoking history. He has never used smokeless tobacco. He reports that he does not currently use alcohol. He reports that he does not use drugs. family history includes Breast cancer in his maternal grandmother; Cancer in his brother, maternal aunt, and mother; Cancer (age of onset: 69) in his daughter; Colon cancer (age of onset: 67) in his sister; Diabetes in his brother. Allergies  Allergen Reactions   Lyrica [Pregabalin] Other (See Comments)    "Just made me feel bad"   Current Outpatient Medications on File Prior to Visit  Medication Sig Dispense Refill   amLODipine (NORVASC) 10 MG tablet Take 1 tablet by mouth once daily 90 tablet 0   aspirin EC 81 MG EC tablet Take 1 tablet (81 mg total) by mouth daily. Swallow  whole. 30 tablet 11   atorvastatin (LIPITOR) 40 MG tablet Take 1 tablet by mouth once daily 90 tablet 2   b complex vitamins capsule Take 1 capsule by mouth daily.     benazepril (LOTENSIN) 40 MG tablet Take 1 tablet by mouth once daily 90 tablet 0   carvedilol (COREG) 12.5 MG tablet TAKE 1 TABLET BY MOUTH 2 TIMES DAILY. 180 tablet 3   cyanocobalamin (VITAMIN B12) 500 MCG tablet Take 500 mcg by mouth daily.     DULoxetine (CYMBALTA) 30 MG capsule TAKE 1 CAPSULE BY MOUTH ONCE DAILY FOR 7 DAYS AND THEN TAKE 2 CAPSULES ONCE DAILY FOR 24 DAYS 55 capsule 0   FERREX 150 150 MG capsule Take 1 capsule by mouth twice daily 180 capsule 0   pantoprazole (PROTONIX) 40 MG tablet Take 1 tablet by mouth once daily 90 tablet 0   potassium chloride SA (KLOR-CON M20) 20 MEQ tablet Take 1 tablet (20 mEq total) by mouth 3 (three) times daily. 90 tablet 2   prochlorperazine (COMPAZINE) 10 MG tablet Take 1 tablet (10 mg total) by mouth every 6 (six) hours as needed for nausea or vomiting (Use for nausea and / or vomiting unresolved with ondansetron (Zofran).). 30 tablet 0   traMADol (ULTRAM) 50 MG tablet TAKE 1 TABLET BY MOUTH EVERY 12 HOURS AS NEEDED 60 tablet 2   Travoprost, BAK Free, (TRAVATAN) 0.004 % SOLN ophthalmic solution Place 1 drop into both eyes at bedtime.     VITAMIN D PO Take 2 tablets by mouth daily. Gummies     No current facility-administered medications on file prior to visit.        ROS:  All others reviewed and negative.  Objective        PE:  BP (!) 158/60 (BP Location: Left Arm, Patient Position: Sitting, Cuff Size: Normal)   Pulse 70   Temp 98.5 F (36.9 C) (Oral)   Ht 5\' 6"  (1.676 m)   Wt 202 lb (91.6 kg)   SpO2 99%   BMI 32.60 kg/m                 Constitutional: Pt appears in NAD               HENT: Head: NCAT.                Right Ear: External ear normal.                 Left Ear: External ear normal.                Eyes: . Pupils are equal, round, and reactive to  light.  Conjunctivae and EOM are normal               Nose: without d/c or deformity               Neck: Neck supple. Gross normal ROM               Cardiovascular: Normal rate and regular rhythm.                 Pulmonary/Chest: Effort normal and breath sounds without rales or wheezing.                Abd:  Soft, NT, ND, + BS, no organomegaly               Neurological: Pt is alert. At baseline orientation, motor grossly intact               Skin: Skin is warm. No rashes, no other new lesions, LE edema - none               Psychiatric: Pt behavior is normal without agitation   Micro: none  Cardiac tracings I have personally interpreted today:  none  Pertinent Radiological findings (summarize): none   Lab Results  Component Value Date   WBC 7.8 10/12/2022   HGB 11.6 (L) 10/12/2022   HCT 34.6 (L) 10/12/2022   PLT 149 (L) 10/12/2022   GLUCOSE 98 10/12/2022   CHOL 134 05/22/2022   TRIG 157.0 (H) 05/22/2022   HDL 38.90 (L) 05/22/2022   LDLDIRECT 185.6 05/15/2007   LDLCALC 63 05/22/2022   ALT 20 10/12/2022   AST 20 10/12/2022   NA 146 (H) 10/12/2022   K 3.4 (L) 10/12/2022   CL 114 (H) 10/12/2022   CREATININE 1.07 10/12/2022   BUN 14 10/12/2022   CO2 28 10/12/2022   TSH 1.73 05/22/2022   PSA 0.12 05/22/2022   INR 1.2 10/22/2021   HGBA1C 5.7 05/22/2022   MICROALBUR 1.3 05/22/2022   Assessment/Plan:  Aaron Moss is a 77 y.o. Black or African American [2] male with  has a past medical history of Anemia (10/31/2021), Carotid stenosis, left, Colon cancer (HCC) (2000), Coronary artery disease, Diverticulosis of colon, Duodenal cancer (HCC) (01/2021), GERD (gastroesophageal reflux disease), Glaucoma, History of chemotherapy (2000), History of radiation therapy (08/06/13- 10/03/13), Hyperlipidemia, Hypertension, Left knee DJD, Prostate cancer (HCC) (05/23/2013), Stroke (HCC) (04/18/2021), and TIA (transient ischemic attack).  Vitamin D deficiency Last vitamin D Lab Results  Component  Value Date   VD25OH 33.75 05/22/2022   Low, to start oral replacement   Malignant neoplasm of prostate [facility]) Also for f/u with urology with referral  Lab Results  Component Value Date   PSA 0.12 05/22/2022   PSA 0.08 (L) 07/07/2020   PSA 0.06 (L) 05/06/2019      Hyperlipidemia Lab Results  Component Value Date   LDLCALC 63 05/22/2022   Stable, pt to continue current statin lipitor 40 mg qd   Hypertension, uncontrolled BP Readings from Last 3 Encounters:  10/19/22 (!) 158/60  10/19/22 (!) 158/60  10/14/22 (!) 135/54   Uncontrolled here, but pt states controlled at home, pt to continue medical treatment norvasc 10 every day, lotension 40 every day, coreg 12l.5 bid as declines change for now, cont to monitor home BP and bring cuff next visit   Hyperglycemia Lab Results  Component Value Date   HGBA1C 5.7 05/22/2022   Stable, pt to continue current medical treatment  - diet,w t control '  Nocturia Worsening recent, for ua and culture r/o uti, but also refer Urology Dr Retta Diones  Followup: Return in about 6 months (around 04/21/2023).  Oliver Barre, MD 10/22/2022 11:01 AM [facility]

## 2022-10-19 NOTE — Patient Instructions (Signed)
Please continue all other medications as before, and refills have been done if requested.  Please have the pharmacy call with any other refills you may need.  Please continue your efforts at being more active, low cholesterol diet, and weight control.  Please keep your appointments with your specialists as you may have planned  You will be contacted regarding the referral for: urology  Please go to the LAB at the blood drawing area for the tests to be done - the urine testing only today  You will be contacted by phone if any changes need to be made immediately.  Otherwise, you will receive a letter about your results with an explanation, but please check with MyChart first.

## 2022-10-19 NOTE — Progress Notes (Addendum)
Subjective:   Aaron Moss is a 77 y.o. male who presents for Medicare Annual/Subsequent preventive examination.  Visit Complete: In person  Patient Medicare AWV questionnaire was completed by the patient on 10/18/2022; I have confirmed that all information answered by patient is correct and no changes since this date.  Review of Systems     Cardiac Risk Factors include: advanced age (>10men, >68 women);dyslipidemia;hypertension;male gender;obesity (BMI >30kg/m2);sedentary lifestyle     Objective:    Today's Vitals   10/19/22 1059 10/19/22 1141  BP: (!) 158/60   Pulse: 70   Temp: 98.5 F (36.9 C)   TempSrc: Oral   SpO2: 99%   Weight: 202 lb (91.6 kg)   Height: 5\' 6"  (1.676 m)   PainSc: 2  2    Body mass index is 32.6 kg/m.     10/19/2022   11:47 AM 11/17/2021   11:43 AM 10/19/2021    6:00 PM 10/12/2021   10:29 AM 09/15/2021    9:31 PM 08/26/2021   12:11 PM 08/12/2021    9:18 AM  Advanced Directives  Does Patient Have a Medical Advance Directive? Yes No No No No No No  Type of Estate agent of [place];Living will        Copy of Healthcare Power of Attorney in Chart? No - copy requested        Would patient like information on creating a medical advance directive?  No - Patient declined No - Patient declined No - Patient declined  No - Patient declined No - Patient declined    Current Medications (verified) Outpatient Encounter Medications as of 10/19/2022  Medication Sig   amLODipine (NORVASC) 10 MG tablet Take 1 tablet by mouth once daily   aspirin EC 81 MG EC tablet Take 1 tablet (81 mg total) by mouth daily. Swallow whole.   atorvastatin (LIPITOR) 40 MG tablet Take 1 tablet by mouth once daily   b complex vitamins capsule Take 1 capsule by mouth daily.   benazepril (LOTENSIN) 40 MG tablet Take 1 tablet by mouth once daily   carvedilol (COREG) 12.5 MG tablet TAKE 1 TABLET BY MOUTH 2 TIMES DAILY.   cyanocobalamin (VITAMIN B12) 500 MCG tablet Take  500 mcg by mouth daily.   DULoxetine (CYMBALTA) 30 MG capsule TAKE 1 CAPSULE BY MOUTH ONCE DAILY FOR 7 DAYS AND THEN TAKE 2 CAPSULES ONCE DAILY FOR 24 DAYS   FERREX 150 150 MG capsule Take 1 capsule by mouth twice daily   pantoprazole (PROTONIX) 40 MG tablet Take 1 tablet by mouth once daily   potassium chloride SA (KLOR-CON M20) 20 MEQ tablet Take 1 tablet (20 mEq total) by mouth 3 (three) times daily.   prochlorperazine (COMPAZINE) 10 MG tablet Take 1 tablet (10 mg total) by mouth every 6 (six) hours as needed for nausea or vomiting (Use for nausea and / or vomiting unresolved with ondansetron (Zofran).).   traMADol (ULTRAM) 50 MG tablet TAKE 1 TABLET BY MOUTH EVERY 12 HOURS AS NEEDED   Travoprost, BAK Free, (TRAVATAN) 0.004 % SOLN ophthalmic solution Place 1 drop into both eyes at bedtime.   VITAMIN D PO Take 2 tablets by mouth daily. Gummies   No facility-administered encounter medications on file as of 10/19/2022.    Allergies (verified) Lyrica [pregabalin]   History: Past Medical History:  Diagnosis Date   Anemia 10/31/2021   Dx IDA   Carotid stenosis, left    Colon cancer (HCC) 2000   Coronary artery  disease    per Dr. 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, volume 11.5 ml   Stroke (HCC) 04/18/2021   TIA (transient ischemic attack)    Past Surgical History:  Procedure Laterality Date   CHOLECYSTECTOMY N/A 10/19/2021   Procedure: CHOLECYSTECTOMY;  Surgeon: Almond Lint, MD;  Location: [facility] OR;  Service: General;  Laterality: N/A;   COLON SURGERY  2000   colon cancer   COLONOSCOPY     ENDARTERECTOMY Left  04/25/2021   Procedure: LEFT ENDARTERECTOMY CAROTID;  Surgeon: Cephus Shelling, MD;  Location: [facility];  Service: Vascular;  Laterality: Left;   EUS     pancreatic cyst   KNEE ARTHROSCOPY  2007   LT- [facility]   LAPAROSCOPY N/A 10/19/2021   Procedure: LAPAROSCOPY DIAGNOSTIC;  Surgeon: Almond Lint, MD;  Location: [facility] OR;  Service: General;  Laterality: N/A;  GENERAL AND TAP BLOCK   PATCH ANGIOPLASTY Left 04/25/2021   Procedure: PATCH ANGIOPLASTY XENOSURE 1CM X 6CM;  Surgeon: Cephus Shelling, MD;  Location: [facility] OR;  Service: Vascular;  Laterality: Left;   PORTACATH PLACEMENT Left 11/17/2021   Procedure: PORT PLACEMENT;  Surgeon: Almond Lint, MD;  Location: [facility] OR;  Service: General;  Laterality: Left;   PROSTATE BIOPSY  05/23/13   gleason 7, volume 11.5 ml   WHIPPLE PROCEDURE N/A 10/19/2021   Procedure: ATTEMPTED WHIPPLE PROCEDURE  WITH GASTRODUODENAL BYPASS;  Surgeon: Almond Lint, MD;  Location: [facility] OR;  Service: General;  Laterality: N/A;  GENERAL AND TAP BLOCK   Family History  Problem Relation Age of Onset   Cancer Mother        Liver? bladder?, dx. late 19s   Colon cancer Sister 55   Cancer Brother        Lung   Diabetes Brother    Cancer Maternal Aunt        unknown type   Breast cancer Maternal Grandmother        dx. >50   Cancer Daughter 85       colon   Esophageal cancer Neg Hx    Rectal cancer Neg Hx    Stomach cancer Neg Hx    Social History   Socioeconomic History   Marital status: Married    Spouse name: Earley Abide   Number of children: 4   Years of education: Not on file   Highest education level: 12th grade  Occupational History   Not on file  Tobacco Use   Smoking status: Former    Current packs/day: 0.00    Average packs/day: 1 pack/day for 22.0 years (22.0 ttl pk-yrs)    Types: Cigarettes    Start date: 03/20/1966    Quit date: 03/20/1988    Years since quitting: 34.6   Smokeless tobacco: Never  Vaping Use   Vaping status: Never Used  Substance and Sexual  Activity   Alcohol use: Not Currently    Comment: rare   Drug use: No   Sexual activity: Not Currently  Other Topics Concern   Not on file  Social History Narrative  Not on file   Social Determinants of Health   Financial Resource Strain: Low Risk  (10/19/2022)   Overall Financial Resource Strain (CARDIA)    Difficulty of Paying Living Expenses: Not hard at all  Food Insecurity: No Food Insecurity (10/19/2022)   Hunger Vital Sign    Worried About Running Out of Food in the Last Year: Never true    Ran Out of Food in the Last Year: Never true  Transportation Needs: No Transportation Needs (10/19/2022)   PRAPARE - Administrator, Civil Service (Medical): No    Lack of Transportation (Non-Medical): No  Physical Activity: Inactive (10/19/2022)   Exercise Vital Sign    Days of Exercise per Week: 0 days    Minutes of Exercise per Session: 0 min  Stress: No Stress Concern Present (10/19/2022)   [employer] of Occupational Health - Occupational Stress Questionnaire    Feeling of Stress : Not at all  Social Connections: Moderately Integrated (10/19/2022)   Social Connection and Isolation Panel [NHANES]    Frequency of Communication with Friends and Family: More than three times a week    Frequency of Social Gatherings with Friends and Family: More than three times a week    Attends Religious Services: 1 to 4 times per year    Active Member of [employer] or Organizations: No    Attends Engineer, structural: Not on file    Marital Status: Married    Tobacco Counseling Counseling given: Not Answered   Clinical Intake:  Pre-visit preparation completed: Yes  Pain : 0-10 Pain Score: 2  Pain Location: Abdomen     BMI - recorded: 32.6 Nutritional Status: BMI > 30  Obese Nutritional Risks: None Diabetes: No  How often do you need to have someone help you when you read instructions, pamphlets, or other written materials from your doctor or pharmacy?: 1 - Never What  is the last grade level you completed in school?: HSG  Interpreter Needed?: No  Information entered by :: Susie Cassette, LPN.   Activities of Daily Living    10/19/2022   11:48 AM 10/18/2022    6:55 PM  In your present state of health, do you have any difficulty performing the following activities:  Hearing? 1 1  Vision? 0 0  Difficulty concentrating or making decisions? 0 0  Walking or climbing stairs? 1 1  Dressing or bathing? 0 0  Doing errands, shopping? 0 0  Preparing Food and eating ? N N  Using the Toilet? N N  In the past six months, have you accidently leaked urine? Y Y  Do you have problems with loss of bowel control? N N  Managing your Medications? N N  Managing your Finances? N N  Housekeeping or managing your Housekeeping? N N    Patient Care Team: Corwin Levins, MD as PCP - General Jens Som Madolyn Frieze, MD as PCP - Cardiology (Cardiology) Iva Boop, MD as Consulting Physician (Gastroenterology) Gean Birchwood, MD as Consulting Physician (Orthopedic Surgery) Sallye Lat, MD as Consulting Physician (Ophthalmology) Malachy Mood, MD as Consulting Physician (Oncology)  Indicate any recent Medical Services you may have received from other than [facility] providers in the past year (date may be approximate).     Assessment:   This is a routine wellness examination for Othal.  Hearing/Vision screen Hearing Screening - Comments:: Patient has hearing difficulty and wears hearing aids. Vision Screening - Comments:: Patient does wear corrective lenses/contacts.  Annual eye exam done  by: [facility]    Dietary issues and exercise activities discussed:     Goals Addressed             This Visit's Progress    Client understands the importance of follow-up with providers by attending scheduled visits        Depression Screen    10/19/2022   11:45 AM 10/19/2022   10:45 AM 05/22/2022    9:16 AM 05/22/2022    8:51 AM 01/31/2022    9:03 AM 10/31/2021   11:19  AM 08/12/2021    9:24 AM  PHQ 2/9 Scores  PHQ - 2 Score 0 0 0 0 0  0  PHQ- 9 Score 0   0 0    Exception Documentation      Patient refusal     Fall Risk    10/19/2022   11:48 AM 10/19/2022   10:44 AM 10/18/2022    6:55 PM 05/22/2022    8:51 AM 01/31/2022    9:03 AM  Fall Risk   Falls in the past year? 0 0 0 0 0  Number falls in past yr: 0 0  0   Injury with Fall? 0 0  0   Risk for fall due to : No Fall Risks   No Fall Risks No Fall Risks  Follow up Falls prevention discussed Falls evaluation completed  Falls evaluation completed;Education provided Falls evaluation completed    MEDICARE RISK AT HOME:  Medicare Risk at Home - 10/19/22 1149     Any stairs in or around the home? Yes    If so, are there any without handrails? No    Home free of loose throw rugs in walkways, pet beds, electrical cords, etc? Yes    Adequate lighting in your home to reduce risk of falls? Yes    Life alert? No    Use of a cane, walker or w/c? No    Grab bars in the bathroom? Yes    Shower chair or bench in shower? No    Elevated toilet seat or a handicapped toilet? Yes             TIMED UP AND GO:  Was the test performed?  Yes  Length of time to ambulate 10 feet: 8 sec Gait steady and fast without use of assistive device    Cognitive Function:    02/05/2015    9:53 AM  MMSE - Mini Mental State Exam  Not completed: --        10/19/2022   11:49 AM 08/12/2021    9:25 AM 07/18/2019   10:46 AM  6CIT Screen  What Year? 0 points 0 points 0 points  What month? 0 points 0 points 0 points  What time? 0 points 0 points 0 points  Count back from 20 0 points 0 points 0 points  Months in reverse 0 points 0 points 0 points  Repeat phrase 0 points 0 points 0 points  Total Score 0 points 0 points 0 points    Immunizations Immunization History  Administered Date(s) Administered   Fluad Quad(high Dose 65+) 11/16/2020, 01/31/2022   Influenza Whole 04/30/2009, 04/25/2010   Influenza, High Dose  Seasonal PF 12/05/2016, 02/17/2018, 12/12/2018   Influenza,inj,Quad PF,6+ Mos 03/05/2013, 04/27/2014, 11/30/2015   Influenza-Unspecified 11/19/2014, 12/26/2019   PFIZER(Purple Top)SARS-COV-2 Vaccination 04/10/2019, 04/28/2019, 12/26/2019, 10/01/2020, 12/20/2020   Pneumococcal Conjugate-13 03/26/2013   Pneumococcal Polysaccharide-23 06/08/2009   Td 03/21/2003   Tdap 05/28/2015, 06/21/2020   Zoster,  Live 02/19/2006    TDAP status: Up to date  Flu Vaccine status: Up to date  Pneumococcal vaccine status: Up to date  Covid-19 vaccine status: Completed vaccines  Qualifies for Shingles Vaccine? Yes   Zostavax completed Yes   Shingrix Completed?: No.    Education has been provided regarding the importance of this vaccine. Patient has been advised to call insurance company to determine out of pocket expense if they have not yet received this vaccine. Advised may also receive vaccine at local pharmacy or Health Dept. Verbalized acceptance and understanding.  Screening Tests Health Maintenance  Topic Date Due   Zoster Vaccines- Shingrix (1 of 2) 12/12/1964   INFLUENZA VACCINE  10/19/2022   COVID-19 Vaccine (6 - 2023-24 season) 11/04/2022 (Originally 11/18/2021)   Medicare Annual Wellness (AWV)  10/19/2023   Colonoscopy  03/18/2026   DTaP/Tdap/Td (4 - Td or Tdap) 06/22/2030   Pneumonia Vaccine 72+ Years old  Completed   Hepatitis C Screening  Completed   HPV VACCINES  Aged Out    Health Maintenance  Health Maintenance Due  Topic Date Due   Zoster Vaccines- Shingrix (1 of 2) 12/12/1964   INFLUENZA VACCINE  10/19/2022    Colorectal cancer screening: Type of screening: Colonoscopy. Completed 03/18/2021. Repeat every 5 years  Lung Cancer Screening: (Low Dose CT Chest recommended if Age 65-80 years, 20 pack-year currently smoking OR have quit w/in 15years.) does not qualify.   Lung Cancer Screening Referral: no  Additional Screening:  Hepatitis C Screening: does qualify; Completed  06/01/2016  Vision Screening: Recommended annual ophthalmology exams for early detection of glaucoma and other disorders of the eye. Is the patient up to date with their annual eye exam?  Yes  Who is the provider or what is the name of the office in which the patient attends annual eye exams? Sallye Lat, MD. If pt is not established with a provider, would they like to be referred to a provider to establish care? No .   Dental Screening: Recommended annual dental exams for proper oral hygiene  Diabetic Foot Exam: Diabetic Foot Exam: Completed 05/22/2022  Community Resource Referral / Chronic Care Management: CRR required this visit?  No   CCM required this visit?  No     Plan:     I have personally reviewed and noted the following in the patient's chart:   Medical and social history Use of alcohol, tobacco or illicit drugs  Current medications and supplements including opioid prescriptions. Patient is not currently taking opioid prescriptions. Functional ability and status Nutritional status Physical activity Advanced directives List of other physicians Hospitalizations, surgeries, and ER visits in previous 12 months Vitals Screenings to include cognitive, depression, and falls Referrals and appointments  In addition, I have reviewed and discussed with patient certain preventive protocols, quality metrics, and best practice recommendations. A written personalized care plan for preventive services as well as general preventive health recommendations were provided to patient.     Mickeal Needy, LPN   0/11/8117   After Visit Summary: Mailed to patient.  Nurse Notes: Normal cognitive status assessed by direct observation by this Nurse Health Advisor. No abnormalities found.

## 2022-10-20 ENCOUNTER — Other Ambulatory Visit: Payer: Self-pay | Admitting: Internal Medicine

## 2022-10-22 ENCOUNTER — Encounter: Payer: Self-pay | Admitting: Internal Medicine

## 2022-10-22 NOTE — Assessment & Plan Note (Signed)
Also for f/u with urology with referral  Lab Results  Component Value Date   PSA 0.12 05/22/2022   PSA 0.08 (L) 07/07/2020   PSA 0.06 (L) 05/06/2019

## 2022-10-22 NOTE — Assessment & Plan Note (Signed)
Lab Results  Component Value Date   LDLCALC 63 05/22/2022   Stable, pt to continue current statin lipitor 40 mg qd

## 2022-10-22 NOTE — Assessment & Plan Note (Signed)
Lab Results  Component Value Date   HGBA1C 5.7 05/22/2022   Stable, pt to continue current medical treatment  - diet, wt control

## 2022-10-22 NOTE — Assessment & Plan Note (Signed)
BP Readings from Last 3 Encounters:  10/19/22 (!) 158/60  10/19/22 (!) 158/60  10/14/22 (!) 135/54   Uncontrolled here, but pt states controlled at home, pt to continue medical treatment norvasc 10 every day, lotension 40 every day, coreg 12l.5 bid as declines change for now, cont to monitor home BP and bring cuff next visit

## 2022-10-22 NOTE — Progress Notes (Unsigned)
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 Simple appearing right renal cyst. 2. Otherwise negative abdominal ultrasound   03/18/2021 Procedure   Upper Endoscopy/Colonoscopy, Dr. Myrtie Neither  Upper Endoscopy Impression: - Normal esophagus. - A single submucosal papule (nodule) found in the stomach. - Duodenal mass. Biopsied. Concerning for malignancy.  Colonoscopy Impression: - Diverticulosis in the entire examined colon. - The examined portion of the ileum was normal. - Patent end-to-end colo-colonic anastomosis, characterized by healthy appearing mucosa. - Internal hemorrhoids. - No specimens collected.   03/18/2021 Initial Biopsy   Diagnosis Duodenum, Biopsy - INVASIVE MODERATE TO POORLY DIFFERENTIATED ADENOCARCINOMA   03/18/2021 Cancer Staging   Staging form: Small Intestine - Adenocarcinoma, AJCC 8th Edition - Clinical stage from 03/18/2021: Stage IIIA (cTX, cN1, cM0) - Signed by Malachy Mood, MD on 06/22/2021 Stage prefix: Initial diagnosis   03/22/2021 Initial Diagnosis   Adenocarcinoma of duodenum (HCC)   04/01/2021 Imaging   EXAM: CT CHEST, ABDOMEN, AND PELVIS WITH CONTRAST  IMPRESSION: 1. Circumferential wall thickening  of the first portion of the duodenum, in keeping with known primary malignancy. 2. Enlarged, hypodense lymph nodes anterior to the pancreatic head and in the portacaval station, concerning for nodal metastatic disease. 3. No other evidence of metastatic disease in the chest, abdomen, or pelvis. 4. Incidental note of a fluid attenuation lesion within the proximal pancreatic body measuring 1.9 x 1.4 cm, with prominence of the pancreatic duct distally, up to 0.6 cm. This lesion was present on remote prior examination dated 05/04/2009, and has slightly increased in size over a long period of time. This is consistent with a small IPMN and benign given indolent behavior over greater than 10 years. 5. Background of very fine centrilobular pulmonary nodules, most concentrated in the lung apices, most commonly seen in smoking-related respiratory bronchiolitis. 6. Coronary artery disease.   Aortic Atherosclerosis (ICD10-I70.0).    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 Of note, a variant of uncertain significance was detected in the ATM (p.A869G), BLM (c.800-3T>G), and SDHA (p.V632F) genes. Report date is 04/27/2021.  The CancerNext-Expanded gene panel offered by [facility] and includes sequencing, rearrangement, and RNA analysis for the following 77 genes: AIP, ALK, APC, ATM, AXIN2, BAP1, BARD1, BLM, BMPR1A, BRCA1, BRCA2, BRIP1, CDC73, CDH1, CDK4, CDKN1B, CDKN2A, CHEK2, CTNNA1, DICER1, FANCC, FH, FLCN, GALNT12, KIF1B, LZTR1, MAX, MEN1, MET, MLH1, MSH2, MSH3, MSH6, MUTYH, NBN, NF1, NF2, NTHL1, PALB2, PHOX2B, PMS2, POT1, PRKAR1A, PTCH1, PTEN, RAD51C, RAD51D, RB1, RECQL, RET, SDHA, SDHAF2, SDHB, SDHC, SDHD, SMAD4, SMARCA4, SMARCB1, SMARCE1, STK11, SUFU, TMEM127, TP53, TSC1, TSC2, VHL and XRCC2 (sequencing and deletion/duplication); EGFR, EGLN1, HOXB13, KIT, MITF, PDGFRA, POLD1, and POLE  (sequencing only); EPCAM and GREM1 (deletion/duplication only).    05/11/2021 -  09/28/2021 Chemotherapy   Patient is on Treatment Plan : COLORECTAL Pembrolizumab (200) q21d     11/24/2021 - 05/03/2022 Chemotherapy   Patient is on Treatment Plan : COLORECTAL FOLFOX q14d x 4 months     02/20/2022 Imaging    IMPRESSION: 1. Distal stomach/duodenal mass is decreased in size when compared with prior exam. 2. Stable upper abdominal adenopathy. 3. 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 4. Aortic Atherosclerosis (ICD10-I70.0).   05/15/2022 Imaging    IMPRESSION: Improved masslike enlargement of the duodenal as well as fold nodularity and thickening along the stomach. In addition there are some small nodes identified in the upper abdomen which are similar overall compared to the study of December 2023   However there is a new spiculated nodule in the middle lobe which is worrisome. In addition there are some small low-attenuation liver lesions which are too small to completely characterize but were not clearly seen on the previous examination. Recommend further evaluation. For the liver lesions in particular a Eovist liver MRI may be of some benefit as clinically directed.   Persistent cystic lesion along the midbody of the pancreas with associated duct dilatation. A cystic neoplasm of the pancreas is possible.   06/06/2022 Imaging   MR Abdomen W Wo Contrast     IMPRESSION: 1. No definite evidence of hepatic metastatic disease. Scattered punctate foci of T2 hyperintensity throughout the liver are not associated with any suspicious enhancement or restricted diffusion and are favored to reflect small cysts or biliary hamartomas. Some of these are suboptimally characterized based on their small size, and given nonvisualization on older prior studies, attention  on follow-up CT in 3-6 months recommended. 2. Unchanged residual irregular thickening of the walls of the distal stomach and proximal duodenum. 3. 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 This lesion is unchanged in size from recent CT, although has slowly enlarged from older prior examinations. Recommend follow-up abdominal MRI in 1 year. 4. Stable mild extrahepatic biliary dilatation status post cholecystectomy. 5. Aortic and branch vessel atherosclerosis, better seen on CT.   09/07/2022 -  Chemotherapy   Patient is on Treatment Plan : COLORECTAL FOLFIRI q14d        CURRENT THERAPY: FOLFIRI q2 weeks, starting 09/07/22  INTERVAL HISTORY Aaron Moss returns for follow up as scheduled, last seen by Dr. Mosetta Putt 10/12/22 and completed C3 of FOLFIRI, dose reduced for fatigue.   ROS   Past Medical History:  Diagnosis Date   Anemia 10/31/2021   Dx IDA   Carotid stenosis, left    Colon cancer (HCC) 2000   Coronary artery disease    per Dr. 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, volume 11.5 ml   Stroke (HCC) 04/18/2021   TIA (transient ischemic attack)      Past Surgical History:  Procedure Laterality Date   CHOLECYSTECTOMY N/A 10/19/2021   Procedure: CHOLECYSTECTOMY;  Surgeon: Almond Lint, MD;  Location: [facility] OR;  Service: General;  Laterality: N/A;   COLON SURGERY  2000   colon cancer   COLONOSCOPY     ENDARTERECTOMY Left 04/25/2021   Procedure: LEFT ENDARTERECTOMY CAROTID;  Surgeon: Cephus Shelling, MD;  Location: [facility] OR;  Service:  Vascular;  Laterality: Left;   EUS     pancreatic cyst   KNEE ARTHROSCOPY  2007   LT- [facility]   LAPAROSCOPY N/A 10/19/2021   Procedure: LAPAROSCOPY DIAGNOSTIC;  Surgeon: Almond Lint, MD;  Location: [facility] OR;  Service: General;  Laterality: N/A;  GENERAL AND TAP BLOCK   PATCH ANGIOPLASTY Left 04/25/2021   Procedure: PATCH ANGIOPLASTY XENOSURE 1CM X 6CM;  Surgeon: Cephus Shelling, MD;  Location: [facility] OR;  Service: Vascular;  Laterality: Left;   PORTACATH PLACEMENT Left 11/17/2021   Procedure: PORT PLACEMENT;  Surgeon: Almond Lint, MD;  Location: [facility] OR;  Service: General;  Laterality: Left;   PROSTATE BIOPSY  05/23/13   gleason 7, volume 11.5 ml   WHIPPLE PROCEDURE N/A 10/19/2021   Procedure: ATTEMPTED WHIPPLE PROCEDURE  WITH GASTRODUODENAL BYPASS;  Surgeon: Almond Lint, MD;  Location: [facility] OR;  Service: General;  Laterality: N/A;  GENERAL AND TAP BLOCK     Outpatient Encounter Medications as of 10/26/2022  Medication Sig   amLODipine (NORVASC) 10 MG tablet Take 1 tablet by mouth once daily   aspirin EC 81 MG EC tablet Take 1 tablet (81 mg total) by mouth daily. Swallow whole.   atorvastatin (LIPITOR) 40 MG tablet Take 1 tablet by mouth once daily   b complex vitamins capsule Take 1 capsule by mouth daily.   benazepril (LOTENSIN) 40 MG tablet Take 1 tablet by mouth once daily   carvedilol (COREG) 12.5 MG tablet TAKE 1 TABLET BY MOUTH 2 TIMES DAILY.   ciprofloxacin (CIPRO) 500 MG tablet Take 1 tablet (500 mg total) by mouth 2 (two) times daily for 10 days.   cyanocobalamin (VITAMIN B12) 500 MCG tablet Take 500 mcg by mouth daily.   DULoxetine (CYMBALTA) 30 MG capsule TAKE 1 CAPSULE BY MOUTH ONCE DAILY FOR 7 DAYS AND THEN TAKE 2 CAPSULES ONCE DAILY FOR 24 DAYS   FERREX 150 150 MG capsule Take 1 capsule by mouth twice daily   pantoprazole (PROTONIX) 40 MG tablet Take 1 tablet by mouth once daily   potassium chloride SA (KLOR-CON M20) 20 MEQ tablet Take 1 tablet (20 mEq total) by mouth 3 (three)  times daily.   prochlorperazine (COMPAZINE) 10 MG tablet Take 1 tablet (10 mg total) by mouth every 6 (six) hours as needed for nausea or vomiting (Use for nausea and / or vomiting unresolved with ondansetron (Zofran).).   traMADol (ULTRAM) 50 MG tablet TAKE 1 TABLET BY MOUTH EVERY 12 HOURS AS NEEDED   Travoprost, BAK Free, (TRAVATAN) 0.004 % SOLN ophthalmic solution Place 1 drop into both eyes at bedtime.   VITAMIN D PO Take 2 tablets by mouth daily. Gummies   No facility-administered encounter medications on file as of 10/26/2022.     There were no vitals filed for this visit. There is no height or weight on file to calculate BMI.   PHYSICAL EXAM GENERAL:alert, no distress and comfortable SKIN: no rash  EYES: sclera clear NECK: without mass LYMPH:  no palpable cervical or supraclavicular lymphadenopathy  LUNGS: clear with normal breathing effort HEART: regular rate & rhythm, no lower extremity edema ABDOMEN: abdomen soft, non-tender and normal bowel sounds NEURO: alert & oriented x 3 with fluent  speech, no focal motor/sensory deficits Breast exam:  PAC without erythema    CBC    Component Value Date/Time   WBC 7.8 10/12/2022 0955   WBC 8.5 10/31/2021 1211   RBC 3.71 (L) 10/12/2022 0955   HGB 11.6 (L) 10/12/2022 0955   HCT 34.6 (L) 10/12/2022 0955   PLT 149 (L) 10/12/2022 0955   MCV 93.3 10/12/2022 0955   MCH 31.3 10/12/2022 0955   MCHC 33.5 10/12/2022 0955   RDW 17.9 (H) 10/12/2022 0955   LYMPHSABS 1.1 10/12/2022 0955   MONOABS 0.7 10/12/2022 0955   EOSABS 0.3 10/12/2022 0955   BASOSABS 0.0 10/12/2022 0955     CMP     Component Value Date/Time   NA 146 (H) 10/12/2022 0955   K 3.4 (L) 10/12/2022 0955   CL 114 (H) 10/12/2022 0955   CO2 28 10/12/2022 0955   GLUCOSE 98 10/12/2022 0955   BUN 14 10/12/2022 0955   CREATININE 1.07 10/12/2022 0955   CALCIUM 9.0 10/12/2022 0955   PROT 6.0 (L) 10/12/2022 0955   ALBUMIN 3.2 (L) 10/12/2022 0955   AST 20 10/12/2022 0955    ALT 20 10/12/2022 0955   ALKPHOS 123 10/12/2022 0955   BILITOT 0.8 10/12/2022 0955   GFRNONAA >60 10/12/2022 0955   GFRAA >60 02/24/2019 0207     ASSESSMENT & PLAN:Aaron Moss is a 77 y.o. 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  -Unfortunately suffered a stroke on 04/18/2021 therefore he did not proceed with upfront surgery  -S/p 8 cycles neoadjuvant Keytruda 05/11/21 - 09/28/21. He tolerated well with mild fatigue.  -attempted Whipple surgery 10/19/21, which was aborted due to obscuring of hepatic artery, s/p gastric bypass. 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  I recommend to start B complex vitamin for nerve health.  We discussed additional medication such as gabapentin, Lyrica, or Cymbalta -He tried Lyrica when he had shingles, made him feel bad, but is open to gabapentin or Cymbalta if symptoms progress -Encouraged him to use other nonpharmacologic remedies such as heat, massage, and hand exercises/as   PMS2-related Lynch syndrome (HNPCC4) -he has a personal history of stage pT3N0 colon cancer in 2000 and prostate cancer dx in 2015, s/p 8 weeks of IMRT with Dr. Dayton Scrape. -genetic testing and counseling on 04/07/21 confirmed a PMS2 mutation, as well as VUS in ATM, BLM, and SDHA.        PLAN:  No orders of the defined types were placed in this encounter.     All questions were answered. The patient knows to call the clinic with any problems,  questions or concerns. No barriers to learning were detected. I spent *** counseling the patient face to face. The total time spent in the appointment was *** and more than 50% was on counseling, review of test results, and coordination of care.   Santiago Glad, NP-C @DATE @

## 2022-10-22 NOTE — Assessment & Plan Note (Signed)
Worsening recent, for ua and culture r/o uti, but also refer Urology Dr Retta Diones

## 2022-10-25 MED FILL — Dexamethasone Sodium Phosphate Inj 100 MG/10ML: INTRAMUSCULAR | Qty: 1 | Status: AC

## 2022-10-26 ENCOUNTER — Other Ambulatory Visit: Payer: Self-pay

## 2022-10-26 ENCOUNTER — Encounter: Payer: Self-pay | Admitting: Nurse Practitioner

## 2022-10-26 ENCOUNTER — Other Ambulatory Visit: Payer: Self-pay | Admitting: Internal Medicine

## 2022-10-26 ENCOUNTER — Inpatient Hospital Stay: Payer: Medicare Other

## 2022-10-26 ENCOUNTER — Inpatient Hospital Stay: Payer: Medicare Other | Attending: Nurse Practitioner

## 2022-10-26 ENCOUNTER — Inpatient Hospital Stay: Payer: Medicare Other | Attending: Nurse Practitioner | Admitting: Nurse Practitioner

## 2022-10-26 VITALS — BP 130/55 | HR 70 | Resp 16

## 2022-10-26 DIAGNOSIS — Z8546 Personal history of malignant neoplasm of prostate: Secondary | ICD-10-CM | POA: Diagnosis not present

## 2022-10-26 DIAGNOSIS — G620 Drug-induced polyneuropathy: Secondary | ICD-10-CM | POA: Insufficient documentation

## 2022-10-26 DIAGNOSIS — T451X5A Adverse effect of antineoplastic and immunosuppressive drugs, initial encounter: Secondary | ICD-10-CM | POA: Diagnosis not present

## 2022-10-26 DIAGNOSIS — Z5189 Encounter for other specified aftercare: Secondary | ICD-10-CM | POA: Diagnosis not present

## 2022-10-26 DIAGNOSIS — Z5111 Encounter for antineoplastic chemotherapy: Secondary | ICD-10-CM | POA: Insufficient documentation

## 2022-10-26 DIAGNOSIS — C170 Malignant neoplasm of duodenum: Secondary | ICD-10-CM

## 2022-10-26 DIAGNOSIS — Z9221 Personal history of antineoplastic chemotherapy: Secondary | ICD-10-CM | POA: Insufficient documentation

## 2022-10-26 DIAGNOSIS — Z923 Personal history of irradiation: Secondary | ICD-10-CM | POA: Diagnosis not present

## 2022-10-26 DIAGNOSIS — Z1509 Genetic susceptibility to other malignant neoplasm: Secondary | ICD-10-CM | POA: Insufficient documentation

## 2022-10-26 DIAGNOSIS — D508 Other iron deficiency anemias: Secondary | ICD-10-CM | POA: Diagnosis present

## 2022-10-26 LAB — CBC WITH DIFFERENTIAL (CANCER CENTER ONLY)
Abs Immature Granulocytes: 0.33 10*3/uL — ABNORMAL HIGH (ref 0.00–0.07)
Basophils Absolute: 0.1 10*3/uL (ref 0.0–0.1)
Basophils Relative: 1 %
Eosinophils Absolute: 0.4 10*3/uL (ref 0.0–0.5)
Eosinophils Relative: 4 %
HCT: 34.4 % — ABNORMAL LOW (ref 39.0–52.0)
Hemoglobin: 11.2 g/dL — ABNORMAL LOW (ref 13.0–17.0)
Immature Granulocytes: 3 %
Lymphocytes Relative: 11 %
Lymphs Abs: 1.1 10*3/uL (ref 0.7–4.0)
MCH: 30.5 pg (ref 26.0–34.0)
MCHC: 32.6 g/dL (ref 30.0–36.0)
MCV: 93.7 fL (ref 80.0–100.0)
Monocytes Absolute: 0.9 10*3/uL (ref 0.1–1.0)
Monocytes Relative: 9 %
Neutro Abs: 6.9 10*3/uL (ref 1.7–7.7)
Neutrophils Relative %: 72 %
Platelet Count: 215 10*3/uL (ref 150–400)
RBC: 3.67 MIL/uL — ABNORMAL LOW (ref 4.22–5.81)
RDW: 18.1 % — ABNORMAL HIGH (ref 11.5–15.5)
WBC Count: 9.6 10*3/uL (ref 4.0–10.5)
nRBC: 1.8 % — ABNORMAL HIGH (ref 0.0–0.2)

## 2022-10-26 LAB — CMP (CANCER CENTER ONLY)
ALT: 25 U/L (ref 0–44)
AST: 24 U/L (ref 15–41)
Albumin: 3.2 g/dL — ABNORMAL LOW (ref 3.5–5.0)
Alkaline Phosphatase: 130 U/L — ABNORMAL HIGH (ref 38–126)
Anion gap: 3 — ABNORMAL LOW (ref 5–15)
BUN: 17 mg/dL (ref 8–23)
CO2: 29 mmol/L (ref 22–32)
Calcium: 8.4 mg/dL — ABNORMAL LOW (ref 8.9–10.3)
Chloride: 112 mmol/L — ABNORMAL HIGH (ref 98–111)
Creatinine: 1.29 mg/dL — ABNORMAL HIGH (ref 0.61–1.24)
GFR, Estimated: 57 mL/min — ABNORMAL LOW (ref >60)
Glucose, Bld: 98 mg/dL (ref 70–99)
Potassium: 4.2 mmol/L (ref 3.5–5.1)
Sodium: 144 mmol/L (ref 135–145)
Total Bilirubin: 0.7 mg/dL (ref 0.3–1.2)
Total Protein: 5.9 g/dL — ABNORMAL LOW (ref 6.5–8.1)

## 2022-10-26 LAB — CEA (IN HOUSE-CHCC): CEA (CHCC-In House): 7.78 ng/mL — ABNORMAL HIGH (ref 0.00–5.00)

## 2022-10-26 MED ORDER — FAMOTIDINE IN NACL 20-0.9 MG/50ML-% IV SOLN
20.0000 mg | Freq: Once | INTRAVENOUS | Status: AC
  Administered 2022-10-26: 20 mg via INTRAVENOUS
  Filled 2022-10-26: qty 50

## 2022-10-26 MED ORDER — SODIUM CHLORIDE 0.9 % IV SOLN: Freq: Once | INTRAVENOUS | Status: AC

## 2022-10-26 MED ORDER — ATROPINE SULFATE 1 MG/ML IV SOLN
0.5000 mg | Freq: Once | INTRAVENOUS | Status: DC | PRN
  Filled 2022-10-26: qty 1

## 2022-10-26 MED ORDER — SODIUM CHLORIDE 0.9 % IV SOLN
400.0000 mg/m2 | Freq: Once | INTRAVENOUS | Status: AC
  Administered 2022-10-26: 844 mg via INTRAVENOUS
  Filled 2022-10-26: qty 42.2

## 2022-10-26 MED ORDER — NITROFURANTOIN MACROCRYSTAL 50 MG PO CAPS: 50.0000 mg | ORAL_CAPSULE | Freq: Two times a day (BID) | ORAL | 0 refills | Status: DC

## 2022-10-26 MED ORDER — SODIUM CHLORIDE 0.9 % IV SOLN
2400.0000 mg/m2 | INTRAVENOUS | Status: DC
  Administered 2022-10-26: 5000 mg via INTRAVENOUS
  Filled 2022-10-26: qty 100

## 2022-10-26 MED ORDER — SODIUM CHLORIDE 0.9 % IV SOLN
150.0000 mg/m2 | Freq: Once | INTRAVENOUS | Status: AC
  Administered 2022-10-26: 300 mg via INTRAVENOUS
  Filled 2022-10-26: qty 3.2

## 2022-10-26 MED ORDER — SODIUM CHLORIDE 0.9 % IV SOLN
10.0000 mg | Freq: Once | INTRAVENOUS | Status: AC
  Administered 2022-10-26: 10 mg via INTRAVENOUS
  Filled 2022-10-26: qty 10

## 2022-10-26 MED ORDER — SODIUM CHLORIDE 0.9% FLUSH
10.0000 mL | INTRAVENOUS | Status: DC | PRN
  Administered 2022-10-26: 10 mL

## 2022-10-26 MED ORDER — DIPHENHYDRAMINE HCL 25 MG PO CAPS
50.0000 mg | ORAL_CAPSULE | Freq: Once | ORAL | Status: AC
  Administered 2022-10-26: 50 mg via ORAL
  Filled 2022-10-26: qty 2

## 2022-10-26 MED ORDER — PALONOSETRON HCL INJECTION 0.25 MG/5ML
0.2500 mg | Freq: Once | INTRAVENOUS | Status: AC
  Administered 2022-10-26: 0.25 mg via INTRAVENOUS
  Filled 2022-10-26: qty 5

## 2022-10-26 NOTE — Telephone Encounter (Signed)
Ok to change cipro to nitrofurantoin for uti due to rash  Staff to please inform pt, I will do rx x 1

## 2022-10-26 NOTE — Telephone Encounter (Signed)
LM for pt informing of rx change

## 2022-10-26 NOTE — Patient Instructions (Signed)
Aaron Moss  Discharge Instructions: Thank you for choosing [facility] to provide your oncology and hematology care.   If you have a lab appointment with the [facility], please go directly to the [facility] and check in at the registration area.   Wear comfortable clothing and clothing appropriate for easy access to any Portacath or PICC line.   We strive to give you quality time with your provider. You may need to reschedule your appointment if you arrive late (15 or more minutes).  Arriving late affects you and other patients whose appointments are after yours.  Also, if you miss three or more appointments without notifying the office, you may be dismissed from the clinic at the provider's discretion.      For prescription refill requests, have your pharmacy contact our office and allow 72 hours for refills to be completed.    Today you received the following chemotherapy and/or immunotherapy agents: Irinotecan, Leucovorin, Fluorouracil.       To help prevent nausea and vomiting after your treatment, we encourage you to take your nausea medication as directed.  BELOW ARE SYMPTOMS THAT SHOULD BE REPORTED IMMEDIATELY: *FEVER GREATER THAN 100.4 F (38 C) OR HIGHER *CHILLS OR SWEATING *NAUSEA AND VOMITING THAT IS NOT CONTROLLED WITH YOUR NAUSEA MEDICATION *UNUSUAL SHORTNESS OF BREATH *UNUSUAL BRUISING OR BLEEDING *URINARY PROBLEMS (pain or burning when urinating, or frequent urination) *BOWEL PROBLEMS (unusual diarrhea, constipation, pain near the anus) TENDERNESS IN MOUTH AND THROAT WITH OR WITHOUT PRESENCE OF ULCERS (sore throat, sores in mouth, or a toothache) UNUSUAL RASH, SWELLING OR PAIN  UNUSUAL VAGINAL DISCHARGE OR ITCHING   Items with * indicate a potential emergency and should be followed up as soon as possible or go to the Emergency Department if any problems should occur.  Please show the CHEMOTHERAPY ALERT CARD or  IMMUNOTHERAPY ALERT CARD at check-in to the Emergency Department and triage nurse.  Should you have questions after your visit or need to cancel or reschedule your appointment, please contact [facility]  Dept: [phone]  and follow the prompts.  Office hours are 8:00 a.m. to 4:30 p.m. Monday - Friday. Please note that voicemails left after 4:00 p.m. may not be returned until the following business day.  We are closed weekends and major holidays. You have access to a nurse at all times for urgent questions. Please call the main number to the clinic Dept: [phone] and follow the prompts.   For any non-urgent questions, you may also contact your provider using MyChart. We now offer e-Visits for anyone 25 and older to request care online for non-urgent symptoms. For details visit [email or web address].   Also download the MyChart app! Go to the app store, search "MyChart", open the app, select [name], and log in with your MyChart username and password.

## 2022-10-28 ENCOUNTER — Other Ambulatory Visit: Payer: Self-pay

## 2022-10-28 ENCOUNTER — Inpatient Hospital Stay: Payer: Medicare Other

## 2022-10-28 VITALS — BP 102/51 | HR 72 | Temp 98.4°F | Resp 18

## 2022-10-28 DIAGNOSIS — Z5111 Encounter for antineoplastic chemotherapy: Secondary | ICD-10-CM | POA: Diagnosis not present

## 2022-10-28 DIAGNOSIS — C170 Malignant neoplasm of duodenum: Secondary | ICD-10-CM

## 2022-10-28 MED ORDER — PEGFILGRASTIM-CBQV 6 MG/0.6ML ~~LOC~~ SOSY
6.0000 mg | PREFILLED_SYRINGE | Freq: Once | SUBCUTANEOUS | Status: AC
  Administered 2022-10-28: 6 mg via SUBCUTANEOUS
  Filled 2022-10-28: qty 0.6

## 2022-10-28 MED ORDER — HEPARIN SOD (PORK) LOCK FLUSH 100 UNIT/ML IV SOLN
500.0000 [IU] | Freq: Once | INTRAVENOUS | Status: AC | PRN
  Administered 2022-10-28: 500 [IU]

## 2022-10-28 MED ORDER — SODIUM CHLORIDE 0.9% FLUSH
10.0000 mL | INTRAVENOUS | Status: DC | PRN
  Administered 2022-10-28: 10 mL

## 2022-11-03 ENCOUNTER — Other Ambulatory Visit: Payer: Self-pay

## 2022-11-03 NOTE — Progress Notes (Signed)
Medication already send in

## 2022-11-05 ENCOUNTER — Other Ambulatory Visit: Payer: Self-pay

## 2022-11-07 ENCOUNTER — Other Ambulatory Visit: Payer: Self-pay | Admitting: Hematology

## 2022-11-08 MED FILL — Dexamethasone Sodium Phosphate Inj 100 MG/10ML: INTRAMUSCULAR | Qty: 1 | Status: AC

## 2022-11-09 ENCOUNTER — Inpatient Hospital Stay: Payer: Medicare Other

## 2022-11-09 ENCOUNTER — Inpatient Hospital Stay: Payer: Medicare Other | Admitting: Nurse Practitioner

## 2022-11-09 ENCOUNTER — Encounter: Payer: Self-pay | Admitting: Nurse Practitioner

## 2022-11-09 DIAGNOSIS — C170 Malignant neoplasm of duodenum: Secondary | ICD-10-CM

## 2022-11-09 DIAGNOSIS — Z5111 Encounter for antineoplastic chemotherapy: Secondary | ICD-10-CM | POA: Diagnosis not present

## 2022-11-09 DIAGNOSIS — Z95828 Presence of other vascular implants and grafts: Secondary | ICD-10-CM

## 2022-11-09 DIAGNOSIS — D500 Iron deficiency anemia secondary to blood loss (chronic): Secondary | ICD-10-CM

## 2022-11-09 LAB — CBC WITH DIFFERENTIAL (CANCER CENTER ONLY)
Abs Immature Granulocytes: 0.12 10*3/uL — ABNORMAL HIGH (ref 0.00–0.07)
Basophils Absolute: 0 10*3/uL (ref 0.0–0.1)
Basophils Relative: 0 %
Eosinophils Absolute: 0.2 10*3/uL (ref 0.0–0.5)
Eosinophils Relative: 2 %
HCT: 34.9 % — ABNORMAL LOW (ref 39.0–52.0)
Hemoglobin: 11.3 g/dL — ABNORMAL LOW (ref 13.0–17.0)
Immature Granulocytes: 1 %
Lymphocytes Relative: 12 %
Lymphs Abs: 1.2 10*3/uL (ref 0.7–4.0)
MCH: 30.3 pg (ref 26.0–34.0)
MCHC: 32.4 g/dL (ref 30.0–36.0)
MCV: 93.6 fL (ref 80.0–100.0)
Monocytes Absolute: 0.8 10*3/uL (ref 0.1–1.0)
Monocytes Relative: 8 %
Neutro Abs: 8 10*3/uL — ABNORMAL HIGH (ref 1.7–7.7)
Neutrophils Relative %: 77 %
Platelet Count: 197 10*3/uL (ref 150–400)
RBC: 3.73 MIL/uL — ABNORMAL LOW (ref 4.22–5.81)
RDW: 17.3 % — ABNORMAL HIGH (ref 11.5–15.5)
WBC Count: 10.4 10*3/uL (ref 4.0–10.5)
nRBC: 0.9 % — ABNORMAL HIGH (ref 0.0–0.2)

## 2022-11-09 LAB — CMP (CANCER CENTER ONLY)
ALT: 22 U/L (ref 0–44)
AST: 20 U/L (ref 15–41)
Albumin: 3 g/dL — ABNORMAL LOW (ref 3.5–5.0)
Alkaline Phosphatase: 132 U/L — ABNORMAL HIGH (ref 38–126)
Anion gap: 6 (ref 5–15)
BUN: 20 mg/dL (ref 8–23)
CO2: 27 mmol/L (ref 22–32)
Calcium: 8.6 mg/dL — ABNORMAL LOW (ref 8.9–10.3)
Chloride: 110 mmol/L (ref 98–111)
Creatinine: 1.13 mg/dL (ref 0.61–1.24)
GFR, Estimated: >60 mL/min (ref >60)
Glucose, Bld: 121 mg/dL — ABNORMAL HIGH (ref 70–99)
Potassium: 3.7 mmol/L (ref 3.5–5.1)
Sodium: 143 mmol/L (ref 135–145)
Total Bilirubin: 0.8 mg/dL (ref 0.3–1.2)
Total Protein: 5.9 g/dL — ABNORMAL LOW (ref 6.5–8.1)

## 2022-11-09 MED ORDER — SODIUM CHLORIDE 0.9% FLUSH
10.0000 mL | INTRAVENOUS | Status: DC | PRN
  Administered 2022-11-09: 10 mL

## 2022-11-09 MED ORDER — SODIUM CHLORIDE 0.9 % IV SOLN
10.0000 mg | Freq: Once | INTRAVENOUS | Status: AC
  Administered 2022-11-09: 10 mg via INTRAVENOUS
  Filled 2022-11-09: qty 10

## 2022-11-09 MED ORDER — SODIUM CHLORIDE 0.9% FLUSH
10.0000 mL | Freq: Once | INTRAVENOUS | Status: AC
  Administered 2022-11-09: 10 mL

## 2022-11-09 MED ORDER — SODIUM CHLORIDE 0.9 % IV SOLN
400.0000 mg/m2 | Freq: Once | INTRAVENOUS | Status: AC
  Administered 2022-11-09: 844 mg via INTRAVENOUS
  Filled 2022-11-09: qty 42.2

## 2022-11-09 MED ORDER — SODIUM CHLORIDE 0.9 % IV SOLN: Freq: Once | INTRAVENOUS | Status: AC

## 2022-11-09 MED ORDER — SODIUM CHLORIDE 0.9 % IV SOLN
150.0000 mg/m2 | Freq: Once | INTRAVENOUS | Status: AC
  Administered 2022-11-09: 300 mg via INTRAVENOUS
  Filled 2022-11-09: qty 3.96

## 2022-11-09 MED ORDER — SODIUM CHLORIDE 0.9 % IV SOLN
2400.0000 mg/m2 | INTRAVENOUS | Status: DC
  Administered 2022-11-09: 5000 mg via INTRAVENOUS
  Filled 2022-11-09: qty 100

## 2022-11-09 MED ORDER — PALONOSETRON HCL INJECTION 0.25 MG/5ML
0.2500 mg | Freq: Once | INTRAVENOUS | Status: AC
  Administered 2022-11-09: 0.25 mg via INTRAVENOUS
  Filled 2022-11-09: qty 5

## 2022-11-09 NOTE — Progress Notes (Signed)
Patient Care Team: Corwin Levins, MD as PCP - General Jens Som, Madolyn Frieze, MD as PCP - Cardiology (Cardiology) Iva Boop, MD as Consulting Physician (Gastroenterology) Gean Birchwood, MD as Consulting Physician (Orthopedic Surgery) Sallye Lat, MD as Consulting Physician (Ophthalmology) Malachy Mood, MD as Consulting Physician (Oncology)   CHIEF COMPLAINT: Follow-up adenocarcinoma of duodenum  Oncology History Overview Note   Cancer Staging  Adenocarcinoma of duodenum [facility]) Staging form: Small Intestine - Adenocarcinoma, AJCC 8th Edition - Clinical stage from 03/18/2021: Stage IIIA (cTX, cN1, cM0) - Signed by Malachy Mood, MD on 06/22/2021 Stage prefix: Initial diagnosis     Adenocarcinoma of duodenum (HCC)  03/01/2021 Imaging   EXAM: ABDOMEN ULTRASOUND COMPLETE  IMPRESSION: 1. Simple appearing right renal cyst. 2. Otherwise negative abdominal ultrasound   03/18/2021 Procedure   Upper Endoscopy/Colonoscopy, Dr. Myrtie Neither  Upper Endoscopy Impression: - Normal esophagus. - A single submucosal papule (nodule) found in the stomach. - Duodenal mass. Biopsied. Concerning for malignancy.  Colonoscopy Impression: - Diverticulosis in the entire examined colon. - The examined portion of the ileum was normal. - Patent end-to-end colo-colonic anastomosis, characterized by healthy appearing mucosa. - Internal hemorrhoids. - No specimens collected.   03/18/2021 Initial Biopsy   Diagnosis Duodenum, Biopsy - INVASIVE MODERATE TO POORLY DIFFERENTIATED ADENOCARCINOMA   03/18/2021 Cancer Staging   Staging form: Small Intestine - Adenocarcinoma, AJCC 8th Edition - Clinical stage from 03/18/2021: Stage IIIA (cTX, cN1, cM0) - Signed by Malachy Mood, MD on 06/22/2021 Stage prefix: Initial diagnosis   03/22/2021 Initial Diagnosis   Adenocarcinoma of duodenum (HCC)   04/01/2021 Imaging   EXAM: CT CHEST, ABDOMEN, AND PELVIS WITH CONTRAST  IMPRESSION: 1. Circumferential wall thickening  of the first portion of the duodenum, in keeping with known primary malignancy. 2. Enlarged, hypodense lymph nodes anterior to the pancreatic head and in the portacaval station, concerning for nodal metastatic disease. 3. No other evidence of metastatic disease in the chest, abdomen, or pelvis. 4. Incidental note of a fluid attenuation lesion within the proximal pancreatic body measuring 1.9 x 1.4 cm, with prominence of the pancreatic duct distally, up to 0.6 cm. This lesion was present on remote prior examination dated 05/04/2009, and has slightly increased in size over a long period of time. This is consistent with a small IPMN and benign given indolent behavior over greater than 10 years. 5. Background of very fine centrilobular pulmonary nodules, most concentrated in the lung apices, most commonly seen in smoking-related respiratory bronchiolitis. 6. Coronary artery disease.   Aortic Atherosclerosis (ICD10-I70.0).    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 Of note, a variant of uncertain significance was detected in the ATM (p.A869G), BLM (c.800-3T>G), and SDHA (p.V632F) genes. Report date is 04/27/2021.  The CancerNext-Expanded gene panel offered by [facility] and includes sequencing, rearrangement, and RNA analysis for the following 77 genes: AIP, ALK, APC, ATM, AXIN2, BAP1, BARD1, BLM, BMPR1A, BRCA1, BRCA2, BRIP1, CDC73, CDH1, CDK4, CDKN1B, CDKN2A, CHEK2, CTNNA1, DICER1, FANCC, FH, FLCN, GALNT12, KIF1B, LZTR1, MAX, MEN1, MET, MLH1, MSH2, MSH3, MSH6, MUTYH, NBN, NF1, NF2, NTHL1, PALB2, PHOX2B, PMS2, POT1, PRKAR1A, PTCH1, PTEN, RAD51C, RAD51D, RB1, RECQL, RET, SDHA, SDHAF2, SDHB, SDHC, SDHD, SMAD4, SMARCA4, SMARCB1, SMARCE1, STK11, SUFU, TMEM127, TP53, TSC1, TSC2, VHL and XRCC2 (sequencing and deletion/duplication); EGFR, EGLN1, HOXB13, KIT, MITF, PDGFRA, POLD1, and POLE  (sequencing only); EPCAM and GREM1 (deletion/duplication only).    05/11/2021 - 09/28/2021  Chemotherapy   Patient is on Treatment Plan : COLORECTAL Pembrolizumab (200) q21d     11/24/2021 - 05/03/2022 Chemotherapy   Patient is on Treatment Plan : COLORECTAL FOLFOX q14d x 4 months     02/20/2022 Imaging    IMPRESSION: 1. Distal stomach/duodenal mass is decreased in size when compared with prior exam. 2. Stable upper abdominal adenopathy. 3. 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 4. Aortic Atherosclerosis (ICD10-I70.0).   05/15/2022 Imaging    IMPRESSION: Improved masslike enlargement of the duodenal as well as fold nodularity and thickening along the stomach. In addition there are some small nodes identified in the upper abdomen which are similar overall compared to the study of December 2023   However there is a new spiculated nodule in the middle lobe which is worrisome. In addition there are some small low-attenuation liver lesions which are too small to completely characterize but were not clearly seen on the previous examination. Recommend further evaluation. For the liver lesions in particular a Eovist liver MRI may be of some benefit as clinically directed.   Persistent cystic lesion along the midbody of the pancreas with associated duct dilatation. A cystic neoplasm of the pancreas is possible.   06/06/2022 Imaging   MR Abdomen W Wo Contrast     IMPRESSION: 1. No definite evidence of hepatic metastatic disease. Scattered punctate foci of T2 hyperintensity throughout the liver are not associated with any suspicious enhancement or restricted diffusion and are favored to reflect small cysts or biliary hamartomas. Some of these are suboptimally characterized based on their small size, and given nonvisualization on older prior studies, attention  on follow-up CT in 3-6 months recommended. 2. Unchanged residual irregular thickening of the walls of the distal stomach and proximal duodenum. 3. 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 This lesion is unchanged in size from recent CT, although has slowly enlarged from older prior examinations. Recommend follow-up abdominal MRI in 1 year. 4. Stable mild extrahepatic biliary dilatation status post cholecystectomy. 5. Aortic and branch vessel atherosclerosis, better seen on CT.   09/07/2022 -  Chemotherapy   Patient is on Treatment Plan : COLORECTAL FOLFIRI q14d        CURRENT THERAPY: FOLFIRI every 2 weeks, starting 09/07/2022; G-CSF added with cycle 2, irinotecan dose reduced with cycle 3  INTERVAL HISTORY Aaron Moss returns for follow-up and treatment as scheduled, last seen by me 10/26/2022 with C4 FOLFIRI and IVF.  He was treated for a Cipro rash at that time which has resolved.  He tolerates treatment similarly to previous cycles, with mild to moderate fatigue for a week and a half and stable neuropathy.  He continues to have a few good days before the next treatment.  He reports having more difficulty swallowing potassium pill, denies reflux or n/v. Denies pain, but states the skin of his abdomen feels sore to touch.  ROS  All other systems reviewed and negative   Past Medical History:  Diagnosis Date   Anemia 10/31/2021   Dx IDA   Carotid stenosis, left    Colon cancer (HCC) 2000   Coronary artery disease    per Dr. 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/23/2013   gleason 3+4=7, volume 11.5 ml   Stroke (HCC) 04/18/2021   TIA (transient ischemic attack)      Past Surgical History:  Procedure Laterality Date   CHOLECYSTECTOMY N/A 10/19/2021   Procedure: CHOLECYSTECTOMY;  Surgeon: Almond Lint, MD;  Location: [facility] OR;  Service: General;  Laterality: N/A;   COLON SURGERY  2000   colon cancer   COLONOSCOPY     ENDARTERECTOMY Left 04/25/2021   Procedure: LEFT ENDARTERECTOMY CAROTID;  Surgeon: Cephus Shelling, MD;  Location: [facility] OR;  Service: Vascular;  Laterality: Left;   EUS     pancreatic cyst   KNEE ARTHROSCOPY  2007   LT- [facility]   LAPAROSCOPY N/A 10/19/2021   Procedure: LAPAROSCOPY DIAGNOSTIC;  Surgeon: Almond Lint, MD;  Location: [facility] OR;  Service: General;  Laterality: N/A;  GENERAL AND TAP BLOCK   PATCH ANGIOPLASTY Left 04/25/2021   Procedure: PATCH ANGIOPLASTY XENOSURE 1CM X 6CM;  Surgeon: Cephus Shelling, MD;  Location: [facility] OR;  Service: Vascular;  Laterality: Left;   PORTACATH PLACEMENT Left 11/17/2021   Procedure: PORT PLACEMENT;  Surgeon: Almond Lint, MD;  Location: [facility] OR;  Service: General;  Laterality: Left;   PROSTATE BIOPSY  05/23/13   gleason 7, volume 11.5 ml   WHIPPLE PROCEDURE N/A 10/19/2021   Procedure: ATTEMPTED WHIPPLE PROCEDURE  WITH GASTRODUODENAL BYPASS;  Surgeon: Almond Lint, MD;  Location: [facility] OR;  Service: General;  Laterality: N/A;  GENERAL AND TAP BLOCK     Outpatient Encounter Medications as of 11/09/2022  Medication Sig   amLODipine (NORVASC) 10 MG tablet Take 1 tablet by mouth once daily   aspirin EC 81 MG EC tablet Take 1 tablet (81 mg total) by mouth daily. Swallow whole.   atorvastatin (LIPITOR) 40 MG tablet Take 1 tablet by mouth once daily   b complex vitamins capsule Take 1 capsule by mouth daily.   benazepril (LOTENSIN) 40 MG tablet Take 1 tablet by mouth once daily   carvedilol (COREG) 12.5 MG tablet TAKE 1 TABLET BY MOUTH 2 TIMES DAILY.   cyanocobalamin (VITAMIN B12) 500 MCG tablet Take  500 mcg by mouth daily.   DULoxetine (CYMBALTA) 30 MG capsule TAKE 1 CAPSULE BY MOUTH ONCE DAILY FOR 7 DAYS AND THEN TAKE 2 CAPSULES BY MOUTH ONCE DAILY FOR 24 DAYS   iron polysaccharides (NIFEREX) 150 MG capsule TAKE 1 CAPSULE BY MOUTH TWICE A DAY   potassium chloride SA (KLOR-CON M20) 20 MEQ tablet Take 1 tablet (20 mEq total) by mouth 3 (three) times daily.   prochlorperazine (COMPAZINE) 10 MG tablet Take 1 tablet (10 mg total) by mouth every 6 (six) hours as needed for nausea or vomiting (Use for nausea and / or vomiting unresolved with ondansetron (Zofran).).   traMADol (ULTRAM) 50 MG tablet TAKE 1 TABLET BY MOUTH EVERY 12 HOURS AS NEEDED   Travoprost, BAK Free, (TRAVATAN) 0.004 % SOLN ophthalmic solution Place 1 drop into both eyes at bedtime.   VITAMIN D PO Take 2 tablets by mouth daily. Gummies   [DISCONTINUED] nitrofurantoin (MACRODANTIN) 50 MG capsule Take 1 capsule (50 mg total) by mouth 2 (two) times daily. (Patient not taking: Reported on 11/09/2022)   [DISCONTINUED] pantoprazole (PROTONIX) 40 MG tablet Take 1 tablet  by mouth once daily (Patient not taking: Reported on 11/09/2022)   No facility-administered encounter medications on file as of 11/09/2022.     Today's Vitals   11/09/22 1038 11/09/22 1043  BP: (!) 148/62   Pulse: 83   Resp: 17   Temp: 97.8 F (36.6 C)   TempSrc: Oral   SpO2: 100%   Weight: 204 lb 4.8 oz (92.7 kg)   PainSc:  0-No pain   Body mass index is 32.97 kg/m.   PHYSICAL EXAM GENERAL:alert, no distress and comfortable SKIN: no rash  EYES: sclera clear NECK: without mass LYMPH:  no palpable cervical or supraclavicular lymphadenopathy  LUNGS: clear with normal breathing effort HEART: regular rate & rhythm, no lower extremity edema ABDOMEN: abdomen soft, non-tender and normal bowel sounds NEURO: alert & oriented x 3 with fluent speech, no focal motor deficits PAC without erythema    CBC    Component Value Date/Time   WBC 10.4 11/09/2022 1020    WBC 8.5 10/31/2021 1211   RBC 3.73 (L) 11/09/2022 1020   HGB 11.3 (L) 11/09/2022 1020   HCT 34.9 (L) 11/09/2022 1020   PLT 197 11/09/2022 1020   MCV 93.6 11/09/2022 1020   MCH 30.3 11/09/2022 1020   MCHC 32.4 11/09/2022 1020   RDW 17.3 (H) 11/09/2022 1020   LYMPHSABS 1.2 11/09/2022 1020   MONOABS 0.8 11/09/2022 1020   EOSABS 0.2 11/09/2022 1020   BASOSABS 0.0 11/09/2022 1020     CMP     Component Value Date/Time   NA 143 11/09/2022 1020   K 3.7 11/09/2022 1020   CL 110 11/09/2022 1020   CO2 27 11/09/2022 1020   GLUCOSE 121 (H) 11/09/2022 1020   BUN 20 11/09/2022 1020   CREATININE 1.13 11/09/2022 1020   CALCIUM 8.6 (L) 11/09/2022 1020   PROT 5.9 (L) 11/09/2022 1020   ALBUMIN 3.0 (L) 11/09/2022 1020   AST 20 11/09/2022 1020   ALT 22 11/09/2022 1020   ALKPHOS 132 (H) 11/09/2022 1020   BILITOT 0.8 11/09/2022 1020   GFRNONAA >60 11/09/2022 1020   GFRAA >60 02/24/2019 0207     ASSESSMENT & PLAN: Aaron Moss is a 77 y.o. 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  -Unfortunately suffered a stroke on 04/18/2021 therefore he did not proceed with upfront surgery  -S/p 8 cycles neoadjuvant Keytruda 05/11/21 - 09/28/21. He tolerated well with mild fatigue.  -attempted Whipple surgery 10/19/21, which was aborted due to obscuring of hepatic artery, s/p gastric bypass. -He began first line FOLFOX on 11/24/21, changed to maintenance Xeloda 05/2022 until he progressed in 08/2022 -Due to his existing neuropathy, he started FOLFIRI 08/2022, dose reduced with C3 for fatigue -Aaron Moss appears stable.  S/p cycle 4 FOLFIRI with G-CSF, tolerating moderately well with fatigue and stable neuropathy.  He has mild dysphagia with pills which is new.  Will monitor closely for now and see what CT next week shows.  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  I recommend to start B complex vitamin for nerve health.  We discussed additional medication such as gabapentin, Lyrica, or Cymbalta -He tried Lyrica when he had shingles, made him feel bad, but is open to gabapentin or Cymbalta if symptoms progress -Encouraged him to use other nonpharmacologic remedies such as heat, massage, and hand exercises/as -Stable   PMS2-related Lynch syndrome (HNPCC4) -he has a personal history of stage pT3N0 colon cancer in 2000 and prostate cancer dx in 2015, s/p 8 weeks of IMRT with Dr. Dayton Scrape. -genetic testing and counseling on 04/07/21 confirmed a PMS2 mutation, as well as VUS in ATM, BLM, and SDHA.    PLAN: -Labs reviewed, adequate to proceed with cycle 5 FOLFIRI today -GCSF on day 3 -Restaging CT 8/29  -Follow-up, CT review, and next cycle in 2 weeks -Refilled Cymbalta    All questions were answered. The patient knows to call the clinic with any problems, questions or concerns. No barriers to learning were detected.   Santiago Glad, NP-C 11/09/2022

## 2022-11-09 NOTE — Patient Instructions (Addendum)
[facility]  Discharge Instructions: Thank you for choosing [facility] to provide your oncology and hematology care.   If you have a lab appointment with the Cancer Center, please go directly to the Cancer Center and check in at the registration area.   Wear comfortable clothing and clothing appropriate for easy access to any Portacath or PICC line.   We strive to give you quality time with your provider. You may need to reschedule your appointment if you arrive late (15 or more minutes).  Arriving late affects you and other patients whose appointments are after yours.  Also, if you miss three or more appointments without notifying the office, you may be dismissed from the clinic at the provider's discretion.      For prescription refill requests, have your pharmacy contact our office and allow 72 hours for refills to be completed.    Today you received the following chemotherapy and/or immunotherapy agents: Irinotecan/Leucovorin/Fluorouracil     To help prevent nausea and vomiting after your treatment, we encourage you to take your nausea medication as directed.  BELOW ARE SYMPTOMS THAT SHOULD BE REPORTED IMMEDIATELY: *FEVER GREATER THAN 100.4 F (38 C) OR HIGHER *CHILLS OR SWEATING *NAUSEA AND VOMITING THAT IS NOT CONTROLLED WITH YOUR NAUSEA MEDICATION *UNUSUAL SHORTNESS OF BREATH *UNUSUAL BRUISING OR BLEEDING *URINARY PROBLEMS (pain or burning when urinating, or frequent urination) *BOWEL PROBLEMS (unusual diarrhea, constipation, pain near the anus) TENDERNESS IN MOUTH AND THROAT WITH OR WITHOUT PRESENCE OF ULCERS (sore throat, sores in mouth, or a toothache) UNUSUAL RASH, SWELLING OR PAIN  UNUSUAL VAGINAL DISCHARGE OR ITCHING   Items with * indicate a potential emergency and should be followed up as soon as possible or go to the Emergency Department if any problems should occur.  Please show the CHEMOTHERAPY ALERT CARD or  IMMUNOTHERAPY ALERT CARD at check-in to the Emergency Department and triage nurse.  Should you have questions after your visit or need to cancel or reschedule your appointment, please contact [facility]  Dept: [phone]  and follow the prompts.  Office hours are 8:00 a.m. to 4:30 p.m. Monday - Friday. Please note that voicemails left after 4:00 p.m. may not be returned until the following business day.  We are closed weekends and major holidays. You have access to a nurse at all times for urgent questions. Please call the main number to the clinic Dept: [phone] and follow the prompts.   For any non-urgent questions, you may also contact your provider using MyChart. We now offer e-Visits for anyone 27 and older to request care online for non-urgent symptoms. For details visit [email or web address].   Also download the MyChart app! Go to the app store, search "MyChart", open the app, select [place], and log in with your MyChart username and password.

## 2022-11-11 ENCOUNTER — Other Ambulatory Visit: Payer: Self-pay

## 2022-11-11 ENCOUNTER — Inpatient Hospital Stay: Payer: Medicare Other

## 2022-11-11 VITALS — BP 115/56 | HR 68 | Temp 97.9°F | Resp 17

## 2022-11-11 DIAGNOSIS — C170 Malignant neoplasm of duodenum: Secondary | ICD-10-CM

## 2022-11-11 DIAGNOSIS — Z5111 Encounter for antineoplastic chemotherapy: Secondary | ICD-10-CM | POA: Diagnosis not present

## 2022-11-11 MED ORDER — SODIUM CHLORIDE 0.9% FLUSH
10.0000 mL | INTRAVENOUS | Status: DC | PRN
  Administered 2022-11-11: 10 mL

## 2022-11-11 MED ORDER — HEPARIN SOD (PORK) LOCK FLUSH 100 UNIT/ML IV SOLN
500.0000 [IU] | Freq: Once | INTRAVENOUS | Status: AC | PRN
  Administered 2022-11-11: 500 [IU]

## 2022-11-11 MED ORDER — PEGFILGRASTIM-CBQV 6 MG/0.6ML ~~LOC~~ SOSY
6.0000 mg | PREFILLED_SYRINGE | Freq: Once | SUBCUTANEOUS | Status: AC
  Administered 2022-11-11: 6 mg via SUBCUTANEOUS

## 2022-11-11 MED ORDER — COLD PACK MISC ONCOLOGY: 1.0000 | Freq: Once | Status: DC | PRN

## 2022-11-16 ENCOUNTER — Ambulatory Visit (HOSPITAL_COMMUNITY): Payer: Medicare Other

## 2022-11-17 ENCOUNTER — Telehealth: Payer: Self-pay | Admitting: Internal Medicine

## 2022-11-17 MED ORDER — PREDNISONE 10 MG PO TABS: ORAL_TABLET | ORAL | 0 refills | Status: DC

## 2022-11-17 NOTE — Telephone Encounter (Signed)
Patient called and states that Dr. Jonny Ruiz had given him and antibiotic and he had broken out in a rash. Dr. Jonny Ruiz changed the antibiotic and the rash went away.  Now that patient has completed the 2nd antibiotic he has broken out in a rash again - worse then the one that he had.  Please call [phone]

## 2022-11-17 NOTE — Telephone Encounter (Signed)
If the rash occurred after finishing the antibiotic, this would argue against the antibiotic being the cause.  I can send a short course of prednisone for this however, thanks

## 2022-11-17 NOTE — Telephone Encounter (Signed)
Called and left voice mail

## 2022-11-22 MED FILL — Dexamethasone Sodium Phosphate Inj 100 MG/10ML: INTRAMUSCULAR | Qty: 1 | Status: AC

## 2022-11-23 ENCOUNTER — Inpatient Hospital Stay: Payer: Medicare Other | Attending: Nurse Practitioner

## 2022-11-23 ENCOUNTER — Inpatient Hospital Stay: Payer: Medicare Other

## 2022-11-23 ENCOUNTER — Inpatient Hospital Stay: Payer: Medicare Other | Attending: Nurse Practitioner | Admitting: Hematology

## 2022-11-23 ENCOUNTER — Encounter: Payer: Self-pay | Admitting: Hematology

## 2022-11-23 VITALS — BP 138/63 | HR 73 | Temp 98.0°F | Resp 18 | Ht 66.0 in | Wt 207.4 lb

## 2022-11-23 DIAGNOSIS — Z5111 Encounter for antineoplastic chemotherapy: Secondary | ICD-10-CM | POA: Diagnosis present

## 2022-11-23 DIAGNOSIS — Z5189 Encounter for other specified aftercare: Secondary | ICD-10-CM | POA: Insufficient documentation

## 2022-11-23 DIAGNOSIS — Z1509 Genetic susceptibility to other malignant neoplasm: Secondary | ICD-10-CM

## 2022-11-23 DIAGNOSIS — Z79899 Other long term (current) drug therapy: Secondary | ICD-10-CM | POA: Diagnosis not present

## 2022-11-23 DIAGNOSIS — G620 Drug-induced polyneuropathy: Secondary | ICD-10-CM

## 2022-11-23 DIAGNOSIS — Z8546 Personal history of malignant neoplasm of prostate: Secondary | ICD-10-CM | POA: Diagnosis not present

## 2022-11-23 DIAGNOSIS — C170 Malignant neoplasm of duodenum: Secondary | ICD-10-CM

## 2022-11-23 DIAGNOSIS — Z7982 Long term (current) use of aspirin: Secondary | ICD-10-CM | POA: Insufficient documentation

## 2022-11-23 DIAGNOSIS — D500 Iron deficiency anemia secondary to blood loss (chronic): Secondary | ICD-10-CM

## 2022-11-23 DIAGNOSIS — T451X5A Adverse effect of antineoplastic and immunosuppressive drugs, initial encounter: Secondary | ICD-10-CM | POA: Insufficient documentation

## 2022-11-23 DIAGNOSIS — Z95828 Presence of other vascular implants and grafts: Secondary | ICD-10-CM

## 2022-11-23 DIAGNOSIS — D508 Other iron deficiency anemias: Secondary | ICD-10-CM | POA: Insufficient documentation

## 2022-11-23 LAB — CBC WITH DIFFERENTIAL (CANCER CENTER ONLY)
Abs Immature Granulocytes: 0.45 10*3/uL — ABNORMAL HIGH (ref 0.00–0.07)
Basophils Absolute: 0 10*3/uL (ref 0.0–0.1)
Basophils Relative: 0 %
Eosinophils Absolute: 0.1 10*3/uL (ref 0.0–0.5)
Eosinophils Relative: 1 %
HCT: 34.6 % — ABNORMAL LOW (ref 39.0–52.0)
Hemoglobin: 11.2 g/dL — ABNORMAL LOW (ref 13.0–17.0)
Immature Granulocytes: 3 %
Lymphocytes Relative: 14 %
Lymphs Abs: 2 10*3/uL (ref 0.7–4.0)
MCH: 29.6 pg (ref 26.0–34.0)
MCHC: 32.4 g/dL (ref 30.0–36.0)
MCV: 91.3 fL (ref 80.0–100.0)
Monocytes Absolute: 1.6 10*3/uL — ABNORMAL HIGH (ref 0.1–1.0)
Monocytes Relative: 11 %
Neutro Abs: 10.2 10*3/uL — ABNORMAL HIGH (ref 1.7–7.7)
Neutrophils Relative %: 71 %
Platelet Count: 202 10*3/uL (ref 150–400)
RBC: 3.79 MIL/uL — ABNORMAL LOW (ref 4.22–5.81)
RDW: 17.7 % — ABNORMAL HIGH (ref 11.5–15.5)
WBC Count: 14.3 10*3/uL — ABNORMAL HIGH (ref 4.0–10.5)
nRBC: 1.3 % — ABNORMAL HIGH (ref 0.0–0.2)

## 2022-11-23 LAB — CMP (CANCER CENTER ONLY)
ALT: 30 U/L (ref 0–44)
AST: 17 U/L (ref 15–41)
Albumin: 3.2 g/dL — ABNORMAL LOW (ref 3.5–5.0)
Alkaline Phosphatase: 124 U/L (ref 38–126)
Anion gap: 3 — ABNORMAL LOW (ref 5–15)
BUN: 21 mg/dL (ref 8–23)
CO2: 30 mmol/L (ref 22–32)
Calcium: 8.5 mg/dL — ABNORMAL LOW (ref 8.9–10.3)
Chloride: 109 mmol/L (ref 98–111)
Creatinine: 1.01 mg/dL (ref 0.61–1.24)
GFR, Estimated: >60 mL/min (ref >60)
Glucose, Bld: 83 mg/dL (ref 70–99)
Potassium: 3.7 mmol/L (ref 3.5–5.1)
Sodium: 142 mmol/L (ref 135–145)
Total Bilirubin: 0.7 mg/dL (ref 0.3–1.2)
Total Protein: 5.7 g/dL — ABNORMAL LOW (ref 6.5–8.1)

## 2022-11-23 LAB — CEA (IN HOUSE-CHCC): CEA (CHCC-In House): 7.97 ng/mL — ABNORMAL HIGH (ref 0.00–5.00)

## 2022-11-23 MED ORDER — SODIUM CHLORIDE 0.9 % IV SOLN: Freq: Once | INTRAVENOUS | Status: AC

## 2022-11-23 MED ORDER — SODIUM CHLORIDE 0.9 % IV SOLN
2000.0000 mg/m2 | INTRAVENOUS | Status: DC
  Administered 2022-11-23: 4200 mg via INTRAVENOUS
  Filled 2022-11-23: qty 84

## 2022-11-23 MED ORDER — SODIUM CHLORIDE 0.9 % IV SOLN
400.0000 mg/m2 | Freq: Once | INTRAVENOUS | Status: AC
  Administered 2022-11-23: 844 mg via INTRAVENOUS
  Filled 2022-11-23: qty 42.2

## 2022-11-23 MED ORDER — SODIUM CHLORIDE 0.9 % IV SOLN
10.0000 mg | Freq: Once | INTRAVENOUS | Status: AC
  Administered 2022-11-23: 10 mg via INTRAVENOUS
  Filled 2022-11-23: qty 10

## 2022-11-23 MED ORDER — PALONOSETRON HCL INJECTION 0.25 MG/5ML
0.2500 mg | Freq: Once | INTRAVENOUS | Status: AC
  Administered 2022-11-23: 0.25 mg via INTRAVENOUS
  Filled 2022-11-23: qty 5

## 2022-11-23 MED ORDER — SODIUM CHLORIDE 0.9% FLUSH
10.0000 mL | Freq: Once | INTRAVENOUS | Status: AC
  Administered 2022-11-23: 10 mL

## 2022-11-23 MED ORDER — SODIUM CHLORIDE 0.9 % IV SOLN
120.0000 mg/m2 | Freq: Once | INTRAVENOUS | Status: AC
  Administered 2022-11-23: 260 mg via INTRAVENOUS
  Filled 2022-11-23: qty 13

## 2022-11-23 MED ORDER — ATROPINE SULFATE 1 MG/ML IV SOLN
0.5000 mg | Freq: Once | INTRAVENOUS | Status: AC | PRN
  Administered 2022-11-23: 0.5 mg via INTRAVENOUS
  Filled 2022-11-23: qty 1

## 2022-11-23 NOTE — Assessment & Plan Note (Signed)
-  he has a personal history of stage pT3N0 colon cancer in 2000 and prostate cancer dx in 2015, s/p 8 weeks of IMRT with Dr. Murray. -genetic testing and counseling on 04/07/21 confirmed a PMS2 mutation, as well as VUS in ATM, BLM, and SDHA. 

## 2022-11-23 NOTE — Patient Instructions (Addendum)
[facility]  Discharge Instructions: Thank you for choosing [facility] to provide your oncology and hematology care.   If you have a lab appointment with the Cancer Center, please go directly to the Cancer Center and check in at the registration area.   Wear comfortable clothing and clothing appropriate for easy access to any Portacath or PICC line.   We strive to give you quality time with your provider. You may need to reschedule your appointment if you arrive late (15 or more minutes).  Arriving late affects you and other patients whose appointments are after yours.  Also, if you miss three or more appointments without notifying the office, you may be dismissed from the clinic at the provider's discretion.      For prescription refill requests, have your pharmacy contact our office and allow 72 hours for refills to be completed.    Today you received the following chemotherapy and/or immunotherapy agents: Irinotecan/Leucovorin/Fluorouracil     To help prevent nausea and vomiting after your treatment, we encourage you to take your nausea medication as directed.  BELOW ARE SYMPTOMS THAT SHOULD BE REPORTED IMMEDIATELY: *FEVER GREATER THAN 100.4 F (38 C) OR HIGHER *CHILLS OR SWEATING *NAUSEA AND VOMITING THAT IS NOT CONTROLLED WITH YOUR NAUSEA MEDICATION *UNUSUAL SHORTNESS OF BREATH *UNUSUAL BRUISING OR BLEEDING *URINARY PROBLEMS (pain or burning when urinating, or frequent urination) *BOWEL PROBLEMS (unusual diarrhea, constipation, pain near the anus) TENDERNESS IN MOUTH AND THROAT WITH OR WITHOUT PRESENCE OF ULCERS (sore throat, sores in mouth, or a toothache) UNUSUAL RASH, SWELLING OR PAIN  UNUSUAL VAGINAL DISCHARGE OR ITCHING   Items with * indicate a potential emergency and should be followed up as soon as possible or go to the Emergency Department if any problems should occur.  Please show the CHEMOTHERAPY ALERT CARD or  IMMUNOTHERAPY ALERT CARD at check-in to the Emergency Department and triage nurse.  Should you have questions after your visit or need to cancel or reschedule your appointment, please contact [facility]  Dept: [phone]  and follow the prompts.  Office hours are 8:00 a.m. to 4:30 p.m. Monday - Friday. Please note that voicemails left after 4:00 p.m. may not be returned until the following business day.  We are closed weekends and major holidays. You have access to a nurse at all times for urgent questions. Please call the main number to the clinic Dept: [phone] and follow the prompts.   For any non-urgent questions, you may also contact your provider using MyChart. We now offer e-Visits for anyone 27 and older to request care online for non-urgent symptoms. For details visit [email or web address].   Also download the MyChart app! Go to the app store, search "MyChart", open the app, select [place], and log in with your MyChart username and password.

## 2022-11-23 NOTE — Assessment & Plan Note (Addendum)
diagnosed 02/2021 by endo-/colonoscopy for weight loss and anemia. Treatment delayed due to his stroke on 04/18/21. S/p 8 cycles neoadjuvant Keytruda 05/11/21 - 09/28/21. He tolerated well with mild fatigue.  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 -He began FOLFOX on 11/24/21. He has tolerated well overall with some taste changes and mild fatigue -restaging CT scan 02/20/2022 showed PR -restaging CT from 05/15/2022 showed improved masslike enlargement of the duodenal as well as fold nodularity and thickening along the stomach, however it showed a new liver lesion, indeterminate, will obtain liver MRI  -Patient complained of worsening fatigue, last cycle chemo was held on 05/18/22. -Liver MRI in March 2024 showed benign liver lesions and a cystic lesion in pancreas, no evidence of liver metastasis.   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  No evidence of distant metastasis. -Due to the cancer progression, and his persistent peripheral neuropathy, I recommend change treatment to second line FOLFIRI, he started on 09/07/22, he is tolerating well so far  -Due to his moderate fatigue, I reduced irinotecan dose slightly from C3 -His performance status has been low due to significant fatigue, I will reduce his chemo dose further, and give him a month of chemo break if his next CT scan is stable. -will schedule restaging CT in next 2 weeks

## 2022-11-23 NOTE — Progress Notes (Signed)
[facility]   [phone] [phone]   Clinic Follow up Note   Patient Care Team: Corwin Levins, MD as PCP - General Jens Som, Madolyn Frieze, MD as PCP - Cardiology (Cardiology) Iva Boop, MD as Consulting Physician (Gastroenterology) Gean Birchwood, MD as Consulting Physician (Orthopedic Surgery) Sallye Lat, MD as Consulting Physician (Ophthalmology) Malachy Mood, MD as Consulting Physician (Oncology)  Date of Service:  11/23/2022  CHIEF COMPLAINT: f/u of adenocarcinoma of duodenum   CURRENT THERAPY:   FOLFIRI every 2 weeks, starting 09/07/2022; G-CSF added with cycle 2, irinotecan dose reduced with cycle 3    ASSESSMENT:  Aaron Moss is a 77 y.o. male with   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 Treatment delayed due to his stroke on 04/18/21. S/p 8 cycles neoadjuvant Keytruda 05/11/21 - 09/28/21. He tolerated well with mild fatigue.  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 -He began FOLFOX on 11/24/21. He has tolerated well overall with some taste changes and mild fatigue -restaging CT scan 02/20/2022 showed PR -restaging CT from 05/15/2022 showed improved masslike enlargement of the duodenal as well as fold nodularity and thickening along the stomach, however it showed a new liver lesion, indeterminate, will obtain liver MRI  -Patient complained of worsening fatigue, last cycle chemo was held on 05/18/22. -Liver MRI in March 2024 showed benign liver lesions and a cystic lesion in pancreas, no evidence of liver metastasis.   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   No evidence of distant metastasis. -Due to the cancer progression, and his persistent peripheral neuropathy, I recommend change treatment to second line FOLFIRI, he started on 09/07/22, he is tolerating well so far  -Due to his moderate fatigue, I reduced irinotecan dose slightly from C3 -His performance status has been low due to significant fatigue, I will reduce his chemo dose further, and give him a month of chemo break if his next CT scan is stable. -will schedule restaging CT in next 2 weeks   PMS2-related Lynch syndrome (HNPCC4) -he has a personal history of stage pT3N0 colon cancer in 2000 and prostate cancer dx in 2015, s/p 8 weeks of IMRT with Dr. Dayton Scrape. -genetic testing and counseling on 04/07/21 confirmed a PMS2 mutation, as well as VUS in ATM, BLM, and SDHA.  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 in February 2024 -Patient reports worsening numbness in his fingers and toes today, stable lately       PLAN: -lab reviewed -CMP-pending -proceed with Folfiri, Irinotecan and leucovorin at  reduce dose today -lab/CT scan in 2 weeks -postpone treatment 9/19  -f/u phone in 3 week  SUMMARY OF ONCOLOGIC HISTORY: Oncology History Overview Note   Cancer Staging  Adenocarcinoma of duodenum [facility]) Staging form: Small Intestine - Adenocarcinoma, AJCC 8th Edition - Clinical stage from 03/18/2021: Stage IIIA (cTX, cN1, cM0) - Signed by Malachy Mood, MD on 06/22/2021 Stage prefix: Initial diagnosis     Adenocarcinoma of duodenum (HCC)  03/01/2021 Imaging   EXAM: ABDOMEN ULTRASOUND COMPLETE  IMPRESSION: 1. Simple appearing right renal cyst. 2. Otherwise negative abdominal ultrasound   03/18/2021 Procedure   Upper Endoscopy/Colonoscopy, Dr. Myrtie Neither  Upper Endoscopy Impression: -  Normal esophagus. - A single submucosal papule (nodule) found in the stomach. - Duodenal mass. Biopsied. Concerning for malignancy.  Colonoscopy Impression: - Diverticulosis  in the entire examined colon. - The examined portion of the ileum was normal. - Patent end-to-end colo-colonic anastomosis, characterized by healthy appearing mucosa. - Internal hemorrhoids. - No specimens collected.   03/18/2021 Initial Biopsy   Diagnosis Duodenum, Biopsy - INVASIVE MODERATE TO POORLY DIFFERENTIATED ADENOCARCINOMA   03/18/2021 Cancer Staging   Staging form: Small Intestine - Adenocarcinoma, AJCC 8th Edition - Clinical stage from 03/18/2021: Stage IIIA (cTX, cN1, cM0) - Signed by Malachy Mood, MD on 06/22/2021 Stage prefix: Initial diagnosis   03/22/2021 Initial Diagnosis   Adenocarcinoma of duodenum (HCC)   04/01/2021 Imaging   EXAM: CT CHEST, ABDOMEN, AND PELVIS WITH CONTRAST  IMPRESSION: 1. Circumferential wall thickening of the first portion of the duodenum, in keeping with known primary malignancy. 2. Enlarged, hypodense lymph nodes anterior to the pancreatic head and in the portacaval station, concerning for nodal metastatic disease. 3. No other evidence of metastatic disease in the chest, abdomen, or pelvis. 4. Incidental note of a fluid attenuation lesion within the proximal pancreatic body measuring 1.9 x 1.4 cm, with prominence of the pancreatic duct distally, up to 0.6 cm. This lesion was present on remote prior examination dated 05/04/2009, and has slightly increased in size over a long period of time. This is consistent with a small IPMN and benign given indolent behavior over greater than 10 years. 5. Background of very fine centrilobular pulmonary nodules, most concentrated in the lung apices, most commonly seen in smoking-related respiratory bronchiolitis. 6. Coronary artery disease.   Aortic Atherosclerosis (ICD10-I70.0).    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 Of note, a variant of uncertain significance was detected in the ATM  (p.A869G), BLM (c.800-3T>G), and SDHA (p.V632F) genes. Report date is 04/27/2021.  The CancerNext-Expanded gene panel offered by [facility] and includes sequencing, rearrangement, and RNA analysis for the following 77 genes: AIP, ALK, APC, ATM, AXIN2, BAP1, BARD1, BLM, BMPR1A, BRCA1, BRCA2, BRIP1, CDC73, CDH1, CDK4, CDKN1B, CDKN2A, CHEK2, CTNNA1, DICER1, FANCC, FH, FLCN, GALNT12, KIF1B, LZTR1, MAX, MEN1, MET, MLH1, MSH2, MSH3, MSH6, MUTYH, NBN, NF1, NF2, NTHL1, PALB2, PHOX2B, PMS2, POT1, PRKAR1A, PTCH1, PTEN, RAD51C, RAD51D, RB1, RECQL, RET, SDHA, SDHAF2, SDHB, SDHC, SDHD, SMAD4, SMARCA4, SMARCB1, SMARCE1, STK11, SUFU, TMEM127, TP53, TSC1, TSC2, VHL and XRCC2 (sequencing and deletion/duplication); EGFR, EGLN1, HOXB13, KIT, MITF, PDGFRA, POLD1, and POLE (sequencing only); EPCAM and GREM1 (deletion/duplication only).    05/11/2021 - 09/28/2021 Chemotherapy   Patient is on Treatment Plan : COLORECTAL Pembrolizumab (200) q21d     11/24/2021 - 05/03/2022 Chemotherapy   Patient is on Treatment Plan : COLORECTAL FOLFOX q14d x 4 months     02/20/2022 Imaging    IMPRESSION: 1. Distal stomach/duodenal mass is decreased in size when compared with prior exam. 2. Stable upper abdominal adenopathy. 3. 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 4. Aortic Atherosclerosis (ICD10-I70.0).   05/15/2022 Imaging    IMPRESSION: Improved masslike enlargement of the duodenal as well as fold nodularity and thickening along the stomach. In addition there are some small nodes identified in the upper abdomen which are similar overall compared to the study of December 2023   However there is a new spiculated nodule in the  middle lobe which is worrisome. In addition there are some small low-attenuation liver lesions which are too small to completely characterize but were not clearly seen on the previous  examination. Recommend further evaluation. For the liver lesions in particular a Eovist liver MRI may be of some benefit as clinically directed.   Persistent cystic lesion along the midbody of the pancreas with associated duct dilatation. A cystic neoplasm of the pancreas is possible.   06/06/2022 Imaging   MR Abdomen W Wo Contrast     IMPRESSION: 1. No definite evidence of hepatic metastatic disease. Scattered punctate foci of T2 hyperintensity throughout the liver are not associated with any suspicious enhancement or restricted diffusion and are favored to reflect small cysts or biliary hamartomas. Some of these are suboptimally characterized based on their small size, and given nonvisualization on older prior studies, attention on follow-up CT in 3-6 months recommended. 2. Unchanged residual irregular thickening of the walls of the distal stomach and proximal duodenum. 3. 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 This lesion is unchanged in size from recent CT, although has slowly enlarged from older prior examinations. Recommend follow-up abdominal MRI in 1 year. 4. Stable mild extrahepatic biliary dilatation status post cholecystectomy. 5. Aortic and branch vessel atherosclerosis, better seen on CT.   09/07/2022 -  Chemotherapy   Patient is on Treatment Plan : COLORECTAL FOLFIRI q14d        INTERVAL HISTORY:  Aaron Moss is here for a follow up of adenocarcinoma of duodenum. He was last seen by NP Lacie on 11/09/2022. He presents to the clinic accompanied by wife.  Pt state that his appetite is good, but his energy level is about the same. Pt state that he does very little movement Pt state that the 2nd week after chemo he feels his energy coming back, but its time for more treatment. Pt state that he had a rash in his abdomin and his leg.He was prescribe prednisone. He also have diarrhea most of  the day.     All other systems were reviewed with the patient and are negative.  MEDICAL HISTORY:  Past Medical History:  Diagnosis Date   Anemia 10/31/2021   Dx IDA   Carotid stenosis, left    Colon cancer (HCC) 2000   Coronary artery disease    per Dr. 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, volume 11.5 ml   Stroke (HCC) 04/18/2021   TIA (transient ischemic attack)     SURGICAL HISTORY: Past Surgical History:  Procedure Laterality Date   CHOLECYSTECTOMY N/A 10/19/2021   Procedure: CHOLECYSTECTOMY;  Surgeon: Almond Lint, MD;  Location: [facility] OR;  Service: General;  Laterality: N/A;   COLON SURGERY  2000   colon cancer   COLONOSCOPY     ENDARTERECTOMY Left 04/25/2021   Procedure: LEFT ENDARTERECTOMY CAROTID;  Surgeon: Cephus Shelling, MD;  Location: [facility] OR;  Service: Vascular;  Laterality: Left;   EUS     pancreatic cyst   KNEE ARTHROSCOPY  2007  LT- [facility]   LAPAROSCOPY N/A 10/19/2021   Procedure: LAPAROSCOPY DIAGNOSTIC;  Surgeon: Almond Lint, MD;  Location: [facility] OR;  Service: General;  Laterality: N/A;  GENERAL AND TAP BLOCK   PATCH ANGIOPLASTY Left 04/25/2021   Procedure: PATCH ANGIOPLASTY XENOSURE 1CM X 6CM;  Surgeon: Cephus Shelling, MD;  Location: [facility] OR;  Service: Vascular;  Laterality: Left;   PORTACATH PLACEMENT Left 11/17/2021   Procedure: PORT PLACEMENT;  Surgeon: Almond Lint, MD;  Location: [facility] OR;  Service: General;  Laterality: Left;   PROSTATE BIOPSY  05/23/13   gleason 7, volume 11.5 ml   WHIPPLE PROCEDURE N/A 10/19/2021   Procedure: ATTEMPTED WHIPPLE PROCEDURE  WITH GASTRODUODENAL  BYPASS;  Surgeon: Almond Lint, MD;  Location: [facility] OR;  Service: General;  Laterality: N/A;  GENERAL AND TAP BLOCK    I have reviewed the social history and family history with the patient and they are unchanged from previous note.  ALLERGIES:  is allergic to lyrica [pregabalin] and ciprofloxacin.  MEDICATIONS:  Current Outpatient Medications  Medication Sig Dispense Refill   amLODipine (NORVASC) 10 MG tablet Take 1 tablet by mouth once daily 90 tablet 0   aspirin EC 81 MG EC tablet Take 1 tablet (81 mg total) by mouth daily. Swallow whole. 30 tablet 11   atorvastatin (LIPITOR) 40 MG tablet Take 1 tablet by mouth once daily 90 tablet 2   b complex vitamins capsule Take 1 capsule by mouth daily.     benazepril (LOTENSIN) 40 MG tablet Take 1 tablet by mouth once daily 90 tablet 2   carvedilol (COREG) 12.5 MG tablet TAKE 1 TABLET BY MOUTH 2 TIMES DAILY. 180 tablet 3   cyanocobalamin (VITAMIN B12) 500 MCG tablet Take 500 mcg by mouth daily.     DULoxetine (CYMBALTA) 30 MG capsule TAKE 1 CAPSULE BY MOUTH ONCE DAILY FOR 7 DAYS AND THEN TAKE 2 CAPSULES BY MOUTH ONCE DAILY FOR 24 DAYS 55 capsule 0   iron polysaccharides (NIFEREX) 150 MG capsule TAKE 1 CAPSULE BY MOUTH TWICE A DAY 60 capsule 2   potassium chloride SA (KLOR-CON M20) 20 MEQ tablet Take 1 tablet (20 mEq total) by mouth 3 (three) times daily. 90 tablet 2   predniSONE (DELTASONE) 10 MG tablet 3 tabs by mouth per day for 3 days,2tabs per day for 3 days,1tab per day for 3 days 18 tablet 0   prochlorperazine (COMPAZINE) 10 MG tablet Take 1 tablet (10 mg total) by mouth every 6 (six) hours as needed for nausea or vomiting (Use for nausea and / or vomiting unresolved with ondansetron (Zofran).). 30 tablet 0   traMADol (ULTRAM) 50 MG tablet TAKE 1 TABLET BY MOUTH EVERY 12 HOURS AS NEEDED 60 tablet 2   Travoprost, BAK Free, (TRAVATAN) 0.004 % SOLN ophthalmic solution Place 1 drop into both eyes at bedtime.     VITAMIN D PO Take 2 tablets by mouth  daily. Gummies     No current facility-administered medications for this visit.   Facility-Administered Medications Ordered in Other Visits  Medication Dose Route Frequency Provider Last Rate Last Admin   fluorouracil (ADRUCIL) 4,200 mg in sodium chloride 0.9 % 66 mL chemo infusion  2,000 mg/m2 (Treatment Plan Recorded) Intravenous 1 day or 1 dose Malachy Mood, MD   Infusion Verify at 11/23/22 1611    PHYSICAL EXAMINATION: ECOG PERFORMANCE STATUS: 2 - Symptomatic, <50% confined to bed  Vitals:   11/23/22 1146  BP: 138/63  Pulse: 73  Resp: 18  Temp:  98 F (36.7 C)  SpO2: 99%   Wt Readings from Last 3 Encounters:  11/23/22 207 lb 6.4 oz (94.1 kg)  11/09/22 204 lb 4.8 oz (92.7 kg)  10/26/22 209 lb (94.8 kg)     GENERAL:alert, no distress and comfortable SKIN: skin color normal, no rashes or significant lesions EYES: normal, Conjunctiva are pink and non-injected, sclera clear  NEURO: alert & oriented x 3 with fluent speech LABORATORY DATA:  I have reviewed the data as listed    Latest Ref Rng & Units 11/23/2022   11:00 AM 11/09/2022   10:20 AM 10/26/2022    9:17 AM  CBC  WBC 4.0 - 10.5 K/uL 14.3  10.4  9.6   Hemoglobin 13.0 - 17.0 g/dL 21.3  08.6  57.8   Hematocrit 39.0 - 52.0 % 34.6  34.9  34.4   Platelets 150 - 400 K/uL 202  197  215         Latest Ref Rng & Units 11/23/2022   11:00 AM 11/09/2022   10:20 AM 10/26/2022    9:17 AM  CMP  Glucose 70 - 99 mg/dL 83  469  98   BUN 8 - 23 mg/dL 21  20  17    Creatinine 0.61 - 1.24 mg/dL 6.29  5.28  4.13   Sodium 135 - 145 mmol/L 142  143  144   Potassium 3.5 - 5.1 mmol/L 3.7  3.7  4.2   Chloride 98 - 111 mmol/L 109  110  112   CO2 22 - 32 mmol/L 30  27  29    Calcium 8.9 - 10.3 mg/dL 8.5  8.6  8.4   Total Protein 6.5 - 8.1 g/dL 5.7  5.9  5.9   Total Bilirubin 0.3 - 1.2 mg/dL 0.7  0.8  0.7   Alkaline Phos 38 - 126 U/L 124  132  130   AST 15 - 41 U/L 17  20  24    ALT 0 - 44 U/L 30  22  25        RADIOGRAPHIC STUDIES: I have  personally reviewed the radiological images as listed and agreed with the findings in the report. No results found.    Orders Placed This Encounter  Procedures   CBC with Differential (Cancer Center Only)    Standing Status:   Future    Standing Expiration Date:   01/11/2024   CMP (Cancer Center only)    Standing Status:   Future    Standing Expiration Date:   01/11/2024   All questions were answered. The patient knows to call the clinic with any problems, questions or concerns. No barriers to learning was detected. The total time spent in the appointment was 30 minutes.     Malachy Mood, MD 11/23/2022   Carolin Coy, CMA, am acting as scribe for Malachy Mood, MD.   I have reviewed the above documentation for accuracy and completeness, and I agree with the above.

## 2022-11-23 NOTE — Assessment & Plan Note (Signed)
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

## 2022-11-24 ENCOUNTER — Telehealth: Payer: Self-pay | Admitting: Hematology

## 2022-11-25 ENCOUNTER — Other Ambulatory Visit: Payer: Self-pay

## 2022-11-25 ENCOUNTER — Other Ambulatory Visit: Payer: Self-pay | Admitting: Internal Medicine

## 2022-11-25 ENCOUNTER — Inpatient Hospital Stay: Payer: Medicare Other

## 2022-11-25 VITALS — BP 127/62 | HR 74 | Temp 97.6°F | Resp 15

## 2022-11-25 DIAGNOSIS — C170 Malignant neoplasm of duodenum: Secondary | ICD-10-CM

## 2022-11-25 DIAGNOSIS — Z5111 Encounter for antineoplastic chemotherapy: Secondary | ICD-10-CM | POA: Diagnosis not present

## 2022-11-25 MED ORDER — PEGFILGRASTIM-CBQV 6 MG/0.6ML ~~LOC~~ SOSY
6.0000 mg | PREFILLED_SYRINGE | Freq: Once | SUBCUTANEOUS | Status: AC
  Administered 2022-11-25: 6 mg via SUBCUTANEOUS

## 2022-11-25 MED ORDER — SODIUM CHLORIDE 0.9% FLUSH
10.0000 mL | INTRAVENOUS | Status: DC | PRN
  Administered 2022-11-25: 10 mL

## 2022-11-25 MED ORDER — HEPARIN SOD (PORK) LOCK FLUSH 100 UNIT/ML IV SOLN
500.0000 [IU] | Freq: Once | INTRAVENOUS | Status: AC | PRN
  Administered 2022-11-25: 500 [IU]

## 2022-11-29 NOTE — Telephone Encounter (Signed)
Received call from pt reporting that he has a rash on stomach, chest & spotty on arms & legs.  He had before & was given a script which helped.  Reports that rash is itching, red, not raised.  Reviewed chart & he was given prednisone dose pack which he took & rash went away but came back when finished & he refilled & rash went away but is back again.  He doesn't have any refills.  Please advise.  Message to Dr Feng/Pod RN

## 2022-12-01 ENCOUNTER — Telehealth: Payer: Self-pay

## 2022-12-01 ENCOUNTER — Inpatient Hospital Stay (HOSPITAL_BASED_OUTPATIENT_CLINIC_OR_DEPARTMENT_OTHER): Payer: Medicare Other | Admitting: Physician Assistant

## 2022-12-01 VITALS — BP 134/50 | HR 72 | Resp 16 | Wt 211.1 lb

## 2022-12-01 DIAGNOSIS — C170 Malignant neoplasm of duodenum: Secondary | ICD-10-CM | POA: Diagnosis not present

## 2022-12-01 DIAGNOSIS — R21 Rash and other nonspecific skin eruption: Secondary | ICD-10-CM | POA: Diagnosis not present

## 2022-12-01 DIAGNOSIS — Z5111 Encounter for antineoplastic chemotherapy: Secondary | ICD-10-CM | POA: Diagnosis not present

## 2022-12-01 MED ORDER — METHYLPREDNISOLONE 4 MG PO TBPK
ORAL_TABLET | ORAL | 0 refills | Status: AC
Start: 2022-12-02 — End: 2022-12-12

## 2022-12-01 NOTE — Progress Notes (Signed)
Symptom Management Consult Note [facility]    Patient Care Team: Corwin Levins, MD as PCP - General Jens Som, Madolyn Frieze, MD as PCP - Cardiology (Cardiology) Iva Boop, MD as Consulting Physician (Gastroenterology) Gean Birchwood, MD as Consulting Physician (Orthopedic Surgery) Sallye Lat, MD as Consulting Physician (Ophthalmology) Malachy Mood, MD as Consulting Physician (Oncology)    Name / MRN / DOB: Aaron Moss  [id number]  May 25, 1945   Date of visit: 12/01/2022   Chief Complaint/Reason for visit: rash   Current Therapy: FOLFIRI every 2 weeks, starting 09/07/2022; G-CSF added with cycle 2, irinotecan dose reduced with cycle 3   Last treatment:  Day 1   Cycle 6 on 11/23/22   ASSESSMENT & PLAN: Patient is a 77 y.o. 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  I have viewed most recent oncology note and lab work.    #Adenocarcinoma of duodenum  - Next appointment with oncologist is 12/07/22   #Rash -Pt unsure exactly when rash started, thought it was after beginning antibiotic ciprofloxacin for UTI. After lengthy discussion with patient pruritis prior to course of ciprofloxacin. Patient did have treatment on 10/12/22 and pruritus started shortly after he believes. Symptoms improved while on course of steroids however returned upon completion.  -PE is consistent with allergic contact dermatitis. This is a common reaction to fluorouracil, so question if that is the etiology of rash. PE not suggestive of Hand-Foot Syndrome. Will defer to primary oncology team to discuss treatment changes if they deem necessary.  If they do not feel this is related to treatment then would recommend dermatology referral. -Will have patient start taking zyrtec daily and I prescribed a long taper of medrol dosepak as rashes spread.. Patient already taking Pepcid as home medication, will have him continue this.   Strict ED precautions  discussed should symptoms worsen.    Heme/Onc History: Oncology History Overview Note   Cancer Staging  Adenocarcinoma of duodenum [facility]) Staging form: Small Intestine - Adenocarcinoma, AJCC 8th Edition - Clinical stage from 03/18/2021: Stage IIIA (cTX, cN1, cM0) - Signed by Malachy Mood, MD on 06/22/2021 Stage prefix: Initial diagnosis     Adenocarcinoma of duodenum (HCC)  03/01/2021 Imaging   EXAM: ABDOMEN ULTRASOUND COMPLETE  IMPRESSION: 1. Simple appearing right renal cyst. 2. Otherwise negative abdominal ultrasound   03/18/2021 Procedure   Upper Endoscopy/Colonoscopy, Dr. Myrtie Neither  Upper Endoscopy Impression: - Normal esophagus. - A single submucosal papule (nodule) found in the stomach. - Duodenal mass. Biopsied. Concerning for malignancy.  Colonoscopy Impression: - Diverticulosis in the entire examined colon. - The examined portion of the ileum was normal. - Patent end-to-end colo-colonic anastomosis, characterized by healthy appearing mucosa. - Internal hemorrhoids. - No specimens collected.   03/18/2021 Initial Biopsy   Diagnosis Duodenum, Biopsy - INVASIVE MODERATE TO POORLY DIFFERENTIATED ADENOCARCINOMA   03/18/2021 Cancer Staging   Staging form: Small Intestine - Adenocarcinoma, AJCC 8th Edition - Clinical stage from 03/18/2021: Stage IIIA (cTX, cN1, cM0) - Signed by Malachy Mood, MD on 06/22/2021 Stage prefix: Initial diagnosis   03/22/2021 Initial Diagnosis   Adenocarcinoma of duodenum (HCC)   04/01/2021 Imaging   EXAM: CT CHEST, ABDOMEN, AND PELVIS WITH CONTRAST  IMPRESSION: 1. Circumferential wall thickening of the first portion of the duodenum, in keeping with known primary malignancy. 2. Enlarged, hypodense lymph nodes anterior to the pancreatic head and in the portacaval station, concerning for nodal metastatic disease. 3. No other evidence of metastatic  disease in the chest, abdomen, or pelvis. 4. Incidental note of a fluid attenuation lesion within the  proximal pancreatic body measuring 1.9 x 1.4 cm, with prominence of the pancreatic duct distally, up to 0.6 cm. This lesion was present on remote prior examination dated 05/04/2009, and has slightly increased in size over a long period of time. This is consistent with a small IPMN and benign given indolent behavior over greater than 10 years. 5. Background of very fine centrilobular pulmonary nodules, most concentrated in the lung apices, most commonly seen in smoking-related respiratory bronchiolitis. 6. Coronary artery disease.   Aortic Atherosclerosis (ICD10-I70.0).    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 Of note, a variant of uncertain significance was detected in the ATM (p.A869G), BLM (c.800-3T>G), and SDHA (p.V632F) genes. Report date is 04/27/2021.  The CancerNext-Expanded gene panel offered by [facility] and includes sequencing, rearrangement, and RNA analysis for the following 77 genes: AIP, ALK, APC, ATM, AXIN2, BAP1, BARD1, BLM, BMPR1A, BRCA1, BRCA2, BRIP1, CDC73, CDH1, CDK4, CDKN1B, CDKN2A, CHEK2, CTNNA1, DICER1, FANCC, FH, FLCN, GALNT12, KIF1B, LZTR1, MAX, MEN1, MET, MLH1, MSH2, MSH3, MSH6, MUTYH, NBN, NF1, NF2, NTHL1, PALB2, PHOX2B, PMS2, POT1, PRKAR1A, PTCH1, PTEN, RAD51C, RAD51D, RB1, RECQL, RET, SDHA, SDHAF2, SDHB, SDHC, SDHD, SMAD4, SMARCA4, SMARCB1, SMARCE1, STK11, SUFU, TMEM127, TP53, TSC1, TSC2, VHL and XRCC2 (sequencing and deletion/duplication); EGFR, EGLN1, HOXB13, KIT, MITF, PDGFRA, POLD1, and POLE (sequencing only); EPCAM and GREM1 (deletion/duplication only).    05/11/2021 - 09/28/2021 Chemotherapy   Patient is on Treatment Plan : COLORECTAL Pembrolizumab (200) q21d     11/24/2021 - 05/03/2022 Chemotherapy   Patient is on Treatment Plan : COLORECTAL FOLFOX q14d x 4 months     02/20/2022 Imaging    IMPRESSION: 1. Distal stomach/duodenal mass is decreased  in size when compared with prior exam. 2. Stable upper abdominal adenopathy. 3. 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 4. Aortic Atherosclerosis (ICD10-I70.0).   05/15/2022 Imaging    IMPRESSION: Improved masslike enlargement of the duodenal as well as fold nodularity and thickening along the stomach. In addition there are some small nodes identified in the upper abdomen which are similar overall compared to the study of December 2023   However there is a new spiculated nodule in the middle lobe which is worrisome. In addition there are some small low-attenuation liver lesions which are too small to completely characterize but were not clearly seen on the previous examination. Recommend further evaluation. For the liver lesions in particular a Eovist liver MRI may be of some benefit as clinically directed.   Persistent cystic lesion along the midbody of the pancreas with associated duct dilatation. A cystic neoplasm of the pancreas is possible.   06/06/2022 Imaging   MR Abdomen W Wo Contrast     IMPRESSION: 1. No definite evidence of hepatic metastatic disease. Scattered punctate foci of T2 hyperintensity throughout the liver are not associated with any suspicious enhancement or restricted diffusion and are favored to reflect small cysts or biliary hamartomas. Some of these are suboptimally characterized based on their small size, and given nonvisualization on older prior studies, attention on follow-up CT in 3-6 months recommended. 2. Unchanged residual irregular thickening of the walls of the distal stomach and proximal duodenum. 3. 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 This lesion is unchanged in size from recent CT, although has slowly enlarged from older prior examinations.  Recommend follow-up abdominal MRI in 1 year. 4. Stable mild extrahepatic biliary dilatation status post cholecystectomy. 5. Aortic and branch vessel atherosclerosis, better seen on CT.   09/07/2022 -  Chemotherapy   Patient is on Treatment Plan : COLORECTAL FOLFIRI q14d         Interval history-: BOSTON BLAIZE is a 77 y.o. male with oncologic history as above presenting to [facility] today with chief complaint of rash. He is accompanied by spouse who provides additional history.  Patient states he has had intermittent rash x 1.5 months.  Patient initially thought rash developed after starting antibiotics ciprofloxacin for UTI on 10/19/2022.  Patient is now recalling that he had generalized itching prior to the antibiotic, does not recall there being a rash.  Patient did have treatment on 10/12/2022 is when he thinks the itching for started.  He was prescribed a prednisone taper that he finished on September 8.  While on the steroids the rash resolved however recurred upon completion.  Rash is located on his stomach, extremities and groin creases.  He has constant itching.  It will sometimes keep him awake at night.  He has been applying over the counter hydrocortisone ointment which gives him temporary relief.  He denies any new detergents, soaps or lotions.  No one in the home has similar rash.      ROS  All other systems are reviewed and are negative for acute change except as noted in the HPI.    Allergies  Allergen Reactions   Lyrica [Pregabalin] Other (See Comments)    "Just made me feel bad"   Ciprofloxacin Itching and Rash     Past Medical History:  Diagnosis Date   Anemia 10/31/2021   Dx IDA   Carotid stenosis, left    Colon cancer (HCC) 2000   Coronary artery disease    per Dr. 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   GERD (gastroesophageal reflux disease)    Glaucoma    History of chemotherapy 2000   colon cancer    History of radiation therapy 08/06/13- 10/03/13   prostate 7800 cGy 40 sessions, seminal vesicles 5600 cGy 40 sessions   Hyperlipidemia    Hypertension    Left knee DJD    Prostate cancer (HCC) 05/23/2013   gleason 3+4=7, volume 11.5 ml   Stroke (HCC) 04/18/2021   TIA (transient ischemic attack)      Past Surgical History:  Procedure Laterality Date   CHOLECYSTECTOMY N/A 10/19/2021   Procedure: CHOLECYSTECTOMY;  Surgeon: Almond Lint, MD;  Location: [facility] OR;  Service: General;  Laterality: N/A;   COLON SURGERY  2000   colon cancer   COLONOSCOPY     ENDARTERECTOMY Left 04/25/2021   Procedure: LEFT ENDARTERECTOMY CAROTID;  Surgeon: Cephus Shelling, MD;  Location: [facility] OR;  Service: Vascular;  Laterality: Left;   EUS     pancreatic cyst   KNEE ARTHROSCOPY  2007   LT- [facility]   LAPAROSCOPY N/A 10/19/2021   Procedure: LAPAROSCOPY DIAGNOSTIC;  Surgeon: Almond Lint, MD;  Location: [facility] OR;  Service: General;  Laterality: N/A;  GENERAL AND TAP BLOCK   PATCH ANGIOPLASTY Left 04/25/2021   Procedure: PATCH ANGIOPLASTY XENOSURE 1CM X 6CM;  Surgeon: Cephus Shelling, MD;  Location[facility] OR;  Service: Vascular;  Laterality: Left;  PORTACATH PLACEMENT Left 11/17/2021   Procedure: PORT PLACEMENT;  Surgeon: Almond Lint, MD;  Location: [facility] OR;  Service: General;  Laterality: Left;   PROSTATE BIOPSY  05/23/13   gleason 7, volume 11.5 ml   WHIPPLE PROCEDURE N/A 10/19/2021   Procedure: ATTEMPTED WHIPPLE PROCEDURE  WITH GASTRODUODENAL BYPASS;  Surgeon: Almond Lint, MD;  Location: [facility] OR;  Service: General;  Laterality: N/A;  GENERAL AND TAP BLOCK    Social History   Socioeconomic History   Marital status: Married    Spouse name: Earley Abide   Number of children: 4   Years of education: Not on file   Highest education level: 12th grade  Occupational History   Not on file  Tobacco Use   Smoking status: Former    Current packs/day: 0.00    Average packs/day: 1 pack/day for 22.0 years (22.0 ttl pk-yrs)     Types: Cigarettes    Start date: 03/20/1966    Quit date: 03/20/1988    Years since quitting: 34.7   Smokeless tobacco: Never  Vaping Use   Vaping status: Never Used  Substance and Sexual Activity   Alcohol use: Not Currently    Comment: rare   Drug use: No   Sexual activity: Not Currently  Other Topics Concern   Not on file  Social History Narrative   Not on file   Social Determinants of Health   Financial Resource Strain: Low Risk  (10/19/2022)   Overall Financial Resource Strain (CARDIA)    Difficulty of Paying Living Expenses: Not hard at all  Food Insecurity: No Food Insecurity (10/19/2022)   Hunger Vital Sign    Worried About Running Out of Food in the Last Year: Never true    Ran Out of Food in the Last Year: Never true  Transportation Needs: No Transportation Needs (10/19/2022)   PRAPARE - Administrator, Civil Service (Medical): No    Lack of Transportation (Non-Medical): No  Physical Activity: Inactive (10/19/2022)   Exercise Vital Sign    Days of Exercise per Week: 0 days    Minutes of Exercise per Session: 0 min  Stress: No Stress Concern Present (10/19/2022)   [employer] of Occupational Health - Occupational Stress Questionnaire    Feeling of Stress : Not at all  Social Connections: Moderately Integrated (10/19/2022)   Social Connection and Isolation Panel [NHANES]    Frequency of Communication with Friends and Family: More than three times a week    Frequency of Social Gatherings with Friends and Family: More than three times a week    Attends Religious Services: 1 to 4 times per year    Active Member of [employer] or Organizations: No    Attends Banker Meetings: Not on file    Marital Status: Married  Catering manager Violence: Not At Risk (10/19/2022)   Humiliation, Afraid, Rape, and Kick questionnaire    Fear of Current or Ex-Partner: No    Emotionally Abused: No    Physically Abused: No    Sexually Abused: No    Family History   Problem Relation Age of Onset   Cancer Mother        Liver? bladder?, dx. late 48s   Colon cancer Sister 87   Cancer Brother        Lung   Diabetes Brother    Cancer Maternal Aunt        unknown type   Breast cancer Maternal Grandmother  dx. >50   Cancer Daughter 18       colon   Esophageal cancer Neg Hx    Rectal cancer Neg Hx    Stomach cancer Neg Hx      Current Outpatient Medications:    [START ON 12/02/2022] methylPREDNISolone (MEDROL DOSEPAK) 4 MG TBPK tablet, Take 5 tablets (20 mg total) by mouth daily with breakfast for 2 days, THEN 4 tablets (16 mg total) daily with breakfast for 2 days, THEN 3 tablets (12 mg total) daily with breakfast for 2 days, THEN 2 tablets (8 mg total) daily with breakfast for 2 days, THEN 1 tablet (4 mg total) daily with breakfast for 2 days., Disp: 30 tablet, Rfl: 0   amLODipine (NORVASC) 10 MG tablet, Take 1 tablet by mouth once daily, Disp: 90 tablet, Rfl: 0   aspirin EC 81 MG EC tablet, Take 1 tablet (81 mg total) by mouth daily. Swallow whole., Disp: 30 tablet, Rfl: 11   atorvastatin (LIPITOR) 40 MG tablet, Take 1 tablet by mouth once daily, Disp: 90 tablet, Rfl: 2   b complex vitamins capsule, Take 1 capsule by mouth daily., Disp: , Rfl:    benazepril (LOTENSIN) 40 MG tablet, Take 1 tablet by mouth once daily, Disp: 90 tablet, Rfl: 2   carvedilol (COREG) 12.5 MG tablet, TAKE 1 TABLET BY MOUTH 2 TIMES DAILY., Disp: 180 tablet, Rfl: 3   cyanocobalamin (VITAMIN B12) 500 MCG tablet, Take 500 mcg by mouth daily., Disp: , Rfl:    DULoxetine (CYMBALTA) 30 MG capsule, TAKE 1 CAPSULE BY MOUTH ONCE DAILY FOR 7 DAYS AND THEN TAKE 2 CAPSULES BY MOUTH ONCE DAILY FOR 24 DAYS, Disp: 55 capsule, Rfl: 0   iron polysaccharides (NIFEREX) 150 MG capsule, TAKE 1 CAPSULE BY MOUTH TWICE A DAY, Disp: 60 capsule, Rfl: 2   potassium chloride SA (KLOR-CON M20) 20 MEQ tablet, Take 1 tablet (20 mEq total) by mouth 3 (three) times daily., Disp: 90 tablet, Rfl: 2    prochlorperazine (COMPAZINE) 10 MG tablet, Take 1 tablet (10 mg total) by mouth every 6 (six) hours as needed for nausea or vomiting (Use for nausea and / or vomiting unresolved with ondansetron (Zofran).)., Disp: 30 tablet, Rfl: 0   traMADol (ULTRAM) 50 MG tablet, TAKE 1 TABLET BY MOUTH EVERY 12 HOURS AS NEEDED, Disp: 60 tablet, Rfl: 2   Travoprost, BAK Free, (TRAVATAN) 0.004 % SOLN ophthalmic solution, Place 1 drop into both eyes at bedtime., Disp: , Rfl:    VITAMIN D PO, Take 2 tablets by mouth daily. Gummies, Disp: , Rfl:   PHYSICAL EXAM: ECOG FS:1 - Symptomatic but completely ambulatory    Vitals:   12/01/22 1305  BP: (!) 134/50  Pulse: 72  Resp: 16  TempSrc: Oral  SpO2: 100%  Weight: 211 lb 1.6 oz (95.8 kg)   Physical Exam Vitals and nursing note reviewed.  Constitutional:      Appearance: He is not ill-appearing or toxic-appearing.  HENT:     Head: Normocephalic.  Eyes:     Conjunctiva/sclera: Conjunctivae normal.  Cardiovascular:     Rate and Rhythm: Normal rate.     Pulses: Normal pulses.  Pulmonary:     Effort: Pulmonary effort is normal.  Abdominal:     General: There is no distension.  Musculoskeletal:     Cervical back: Normal range of motion.     Right lower leg: No edema.     Left lower leg: No edema.  Skin:    General:  Skin is warm and dry.     Findings: Rash present.     Comments: Please see media below  Confluent macular rash on all extremities, abdomen and groin folds. Excoriation noted to BUE. No rash on palms or soles.  Neurological:     Mental Status: He is alert.           LABORATORY DATA: I have reviewed the data as listed    Latest Ref Rng & Units 11/23/2022   11:00 AM 11/09/2022   10:20 AM 10/26/2022    9:17 AM  CBC  WBC 4.0 - 10.5 K/uL 14.3  10.4  9.6   Hemoglobin 13.0 - 17.0 g/dL 40.9  81.1  91.4   Hematocrit 39.0 - 52.0 % 34.6  34.9  34.4   Platelets 150 - 400 K/uL 202  197  215         Latest Ref Rng & Units 11/23/2022    11:00 AM 11/09/2022   10:20 AM 10/26/2022    9:17 AM  CMP  Glucose 70 - 99 mg/dL 83  782  98   BUN 8 - 23 mg/dL 21  20  17    Creatinine 0.61 - 1.24 mg/dL 9.56  2.13  0.86   Sodium 135 - 145 mmol/L 142  143  144   Potassium 3.5 - 5.1 mmol/L 3.7  3.7  4.2   Chloride 98 - 111 mmol/L 109  110  112   CO2 22 - 32 mmol/L 30  27  29    Calcium 8.9 - 10.3 mg/dL 8.5  8.6  8.4   Total Protein 6.5 - 8.1 g/dL 5.7  5.9  5.9   Total Bilirubin 0.3 - 1.2 mg/dL 0.7  0.8  0.7   Alkaline Phos 38 - 126 U/L 124  132  130   AST 15 - 41 U/L 17  20  24    ALT 0 - 44 U/L 30  22  25         RADIOGRAPHIC STUDIES (from last 24 hours if applicable) I have personally reviewed the radiological images as listed and agreed with the findings in the report. No results found.      Visit Diagnosis: No diagnosis found.   No orders of the defined types were placed in this encounter.   All questions were answered. The patient knows to call the clinic with any problems, questions or concerns. No barriers to learning was detected.  A total of more than 30 minutes were spent on this encounter with face-to-face time and non-face-to-face time, including preparing to see the patient, ordering tests and/or medications, counseling the patient and coordination of care as outlined above.    Thank you for allowing me to participate in the care of this patient.    Shanon Ace, PA-C Department of Hematology/Oncology [facility] at [facility] Phone: [phone]  [phone]    12/01/2022 3:55 PM

## 2022-12-01 NOTE — Telephone Encounter (Signed)
Called and spoke with patient regarding ongoing rash. [facility] appointment made for today, 9/13, at 1pm for patient to be evaluated. He verbalized understanding of date, time and location of appointment.

## 2022-12-04 ENCOUNTER — Ambulatory Visit (HOSPITAL_COMMUNITY)
Admission: RE | Admit: 2022-12-04 | Discharge: 2022-12-04 | Disposition: A | Discharge status: Home / Self Care | Payer: Medicare Other | Source: Ambulatory Visit | Attending: Nurse Practitioner | Admitting: Nurse Practitioner

## 2022-12-04 DIAGNOSIS — C170 Malignant neoplasm of duodenum: Secondary | ICD-10-CM | POA: Diagnosis present

## 2022-12-04 MED ORDER — IOHEXOL 300 MG/ML  SOLN
100.0000 mL | Freq: Once | INTRAMUSCULAR | Status: AC | PRN
  Administered 2022-12-04: 100 mL via INTRAVENOUS

## 2022-12-04 MED ORDER — IOHEXOL 9 MG/ML PO SOLN
1000.0000 mL | Freq: Once | ORAL | Status: AC
  Administered 2022-12-04: 1000 mL via ORAL

## 2022-12-04 MED ORDER — HEPARIN SOD (PORK) LOCK FLUSH 100 UNIT/ML IV SOLN
INTRAVENOUS | Status: AC
  Filled 2022-12-04: qty 5

## 2022-12-04 MED ORDER — HEPARIN SOD (PORK) LOCK FLUSH 100 UNIT/ML IV SOLN
500.0000 [IU] | Freq: Once | INTRAVENOUS | Status: AC
  Administered 2022-12-04: 500 [IU] via INTRAVENOUS

## 2022-12-07 ENCOUNTER — Inpatient Hospital Stay: Payer: Medicare Other | Admitting: Nurse Practitioner

## 2022-12-07 ENCOUNTER — Inpatient Hospital Stay: Payer: Medicare Other

## 2022-12-07 ENCOUNTER — Encounter: Payer: Self-pay | Admitting: Nurse Practitioner

## 2022-12-07 NOTE — Progress Notes (Deleted)
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 Simple appearing right renal cyst. 2. Otherwise negative abdominal ultrasound   03/18/2021 Procedure   Upper Endoscopy/Colonoscopy, Dr. Myrtie Neither  Upper Endoscopy Impression: - Normal esophagus. - A single submucosal papule (nodule) found in the stomach. - Duodenal mass. Biopsied. Concerning for malignancy.  Colonoscopy Impression: - Diverticulosis in the entire examined colon. - The examined portion of the ileum was normal. - Patent end-to-end colo-colonic anastomosis, characterized by healthy appearing mucosa. - Internal hemorrhoids. - No specimens collected.   03/18/2021 Initial Biopsy   Diagnosis Duodenum, Biopsy - INVASIVE MODERATE TO POORLY DIFFERENTIATED ADENOCARCINOMA   03/18/2021 Cancer Staging   Staging form: Small Intestine - Adenocarcinoma, AJCC 8th Edition - Clinical stage from 03/18/2021: Stage IIIA (cTX, cN1, cM0) - Signed by Malachy Mood, MD on 06/22/2021 Stage prefix: Initial diagnosis   03/22/2021 Initial Diagnosis   Adenocarcinoma of duodenum (HCC)   04/01/2021 Imaging   EXAM: CT CHEST, ABDOMEN, AND PELVIS WITH CONTRAST  IMPRESSION: 1. Circumferential wall  thickening of the first portion of the duodenum, in keeping with known primary malignancy. 2. Enlarged, hypodense lymph nodes anterior to the pancreatic head and in the portacaval station, concerning for nodal metastatic disease. 3. No other evidence of metastatic disease in the chest, abdomen, or pelvis. 4. Incidental note of a fluid attenuation lesion within the proximal pancreatic body measuring 1.9 x 1.4 cm, with prominence of the pancreatic duct distally, up to 0.6 cm. This lesion was present on remote prior examination dated 05/04/2009, and has slightly increased in size over a long period of time. This is consistent with a small IPMN and benign given indolent behavior over greater than 10 years. 5. Background of very fine centrilobular pulmonary nodules, most concentrated in the lung apices, most commonly seen in smoking-related respiratory bronchiolitis. 6. Coronary artery disease.   Aortic Atherosclerosis (ICD10-I70.0).    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 Of note, a variant of uncertain significance was detected in the ATM (p.A869G), BLM (c.800-3T>G), and SDHA (p.V632F) genes. Report date is 04/27/2021.  The CancerNext-Expanded gene panel offered by [facility] and includes sequencing, rearrangement, and RNA analysis for the following 77 genes: AIP, ALK, APC, ATM, AXIN2, BAP1, BARD1, BLM, BMPR1A, BRCA1, BRCA2, BRIP1, CDC73, CDH1, CDK4, CDKN1B, CDKN2A, CHEK2, CTNNA1, DICER1, FANCC, FH, FLCN, GALNT12, KIF1B, LZTR1, MAX, MEN1, MET, MLH1, MSH2, MSH3, MSH6, MUTYH, NBN, NF1, NF2, NTHL1, PALB2, PHOX2B, PMS2, POT1, PRKAR1A, PTCH1, PTEN, RAD51C, RAD51D, RB1, RECQL, RET, SDHA, SDHAF2, SDHB, SDHC, SDHD, SMAD4, SMARCA4, SMARCB1, SMARCE1, STK11, SUFU, TMEM127, TP53, TSC1, TSC2, VHL and XRCC2 (sequencing and deletion/duplication); EGFR, EGLN1, HOXB13, KIT, MITF, PDGFRA, POLD1, and  POLE (sequencing only); EPCAM and GREM1 (deletion/duplication only).    05/11/2021 -  09/28/2021 Chemotherapy   Patient is on Treatment Plan : COLORECTAL Pembrolizumab (200) q21d     11/24/2021 - 05/03/2022 Chemotherapy   Patient is on Treatment Plan : COLORECTAL FOLFOX q14d x 4 months     02/20/2022 Imaging    IMPRESSION: 1. Distal stomach/duodenal mass is decreased in size when compared with prior exam. 2. Stable upper abdominal adenopathy. 3. 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 4. Aortic Atherosclerosis (ICD10-I70.0).   05/15/2022 Imaging    IMPRESSION: Improved masslike enlargement of the duodenal as well as fold nodularity and thickening along the stomach. In addition there are some small nodes identified in the upper abdomen which are similar overall compared to the study of December 2023   However there is a new spiculated nodule in the middle lobe which is worrisome. In addition there are some small low-attenuation liver lesions which are too small to completely characterize but were not clearly seen on the previous examination. Recommend further evaluation. For the liver lesions in particular a Eovist liver MRI may be of some benefit as clinically directed.   Persistent cystic lesion along the midbody of the pancreas with associated duct dilatation. A cystic neoplasm of the pancreas is possible.   06/06/2022 Imaging   MR Abdomen W Wo Contrast     IMPRESSION: 1. No definite evidence of hepatic metastatic disease. Scattered punctate foci of T2 hyperintensity throughout the liver are not associated with any suspicious enhancement or restricted diffusion and are favored to reflect small cysts or biliary hamartomas. Some of these are suboptimally characterized based on their small size, and given nonvisualization on older prior studies, attention  on follow-up CT in 3-6 months recommended. 2. Unchanged residual irregular thickening of the walls of the distal stomach and proximal duodenum. 3. 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 This lesion is unchanged in size from recent CT, although has slowly enlarged from older prior examinations. Recommend follow-up abdominal MRI in 1 year. 4. Stable mild extrahepatic biliary dilatation status post cholecystectomy. 5. Aortic and branch vessel atherosclerosis, better seen on CT.   09/07/2022 -  Chemotherapy   Patient is on Treatment Plan : COLORECTAL FOLFIRI q14d        CURRENT THERAPY: FOLFIRI every 2 weeks, starting 09/07/2022; G-CSF added with cycle 2, irinotecan dose reduced with cycle 3   INTERVAL HISTORY Aaron Moss returns for follow-up and treatment as scheduled  ROS   Past Medical History:  Diagnosis Date  . Anemia 10/31/2021   Dx IDA  . Carotid stenosis, left   . Colon cancer (HCC) 2000  . Coronary artery disease    per Dr. Ludwig Clarks note from 04/13/21  . Diverticulosis of colon   . Duodenal cancer (HCC) 01/2021  . GERD (gastroesophageal reflux disease)   . Glaucoma   . History of chemotherapy 2000   colon cancer  . History of radiation therapy 08/06/13- 10/03/13   prostate 7800 cGy 40 sessions, seminal vesicles 5600 cGy 40 sessions  . Hyperlipidemia   . Hypertension   . Left knee DJD   . Prostate cancer (HCC) 05/23/2013   gleason 3+4=7, volume 11.5 ml  . Stroke (HCC) 04/18/2021  . TIA (transient ischemic attack)      Past Surgical History:  Procedure Laterality Date  . CHOLECYSTECTOMY N/A 10/19/2021   Procedure: CHOLECYSTECTOMY;  Surgeon: Almond Lint, MD;  Location:  [facility] OR;  Service: General;  Laterality: N/A;  . COLON SURGERY  2000   colon cancer  . COLONOSCOPY    . ENDARTERECTOMY Left 04/25/2021   Procedure: LEFT ENDARTERECTOMY CAROTID;  Surgeon: Cephus Shelling, MD;  Location:  [facility] OR;  Service: Vascular;  Laterality: Left;  . EUS     pancreatic cyst  . KNEE ARTHROSCOPY  2007   LT- [facility]  . LAPAROSCOPY N/A 10/19/2021   Procedure: LAPAROSCOPY DIAGNOSTIC;  Surgeon: Almond Lint, MD;  Location: [facility] OR;  Service: General;  Laterality: N/A;  GENERAL AND TAP BLOCK  . PATCH ANGIOPLASTY Left 04/25/2021   Procedure: PATCH ANGIOPLASTY XENOSURE 1CM X 6CM;  Surgeon: Cephus Shelling, MD;  Location: [facility] OR;  Service: Vascular;  Laterality: Left;  . PORTACATH PLACEMENT Left 11/17/2021   Procedure: PORT PLACEMENT;  Surgeon: Almond Lint, MD;  Location: [facility] OR;  Service: General;  Laterality: Left;  . PROSTATE BIOPSY  05/23/13   gleason 7, volume 11.5 ml  . WHIPPLE PROCEDURE N/A 10/19/2021   Procedure: ATTEMPTED WHIPPLE PROCEDURE  WITH GASTRODUODENAL BYPASS;  Surgeon: Almond Lint, MD;  Location: [facility] OR;  Service: General;  Laterality: N/A;  GENERAL AND TAP BLOCK     Outpatient Encounter Medications as of 12/07/2022  Medication Sig  . amLODipine (NORVASC) 10 MG tablet Take 1 tablet by mouth once daily  . aspirin EC 81 MG EC tablet Take 1 tablet (81 mg total) by mouth daily. Swallow whole.  Marland Kitchen atorvastatin (LIPITOR) 40 MG tablet Take 1 tablet by mouth once daily  . b complex vitamins capsule Take 1 capsule by mouth daily.  . benazepril (LOTENSIN) 40 MG tablet Take 1 tablet by mouth once daily  . carvedilol (COREG) 12.5 MG tablet TAKE 1 TABLET BY MOUTH 2 TIMES DAILY.  . cyanocobalamin (VITAMIN B12) 500 MCG tablet Take 500 mcg by mouth daily.  . DULoxetine (CYMBALTA) 30 MG capsule TAKE 1 CAPSULE BY MOUTH ONCE DAILY FOR 7 DAYS AND THEN TAKE 2 CAPSULES BY MOUTH ONCE DAILY FOR 24 DAYS  . iron polysaccharides (NIFEREX) 150 MG capsule TAKE 1 CAPSULE BY MOUTH TWICE A DAY  . methylPREDNISolone (MEDROL DOSEPAK) 4 MG TBPK tablet Take 5 tablets (20 mg total) by mouth daily with breakfast for 2 days, THEN 4 tablets (16 mg total) daily with breakfast for 2 days, THEN 3 tablets (12 mg total) daily  with breakfast for 2 days, THEN 2 tablets (8 mg total) daily with breakfast for 2 days, THEN 1 tablet (4 mg total) daily with breakfast for 2 days.  . potassium chloride SA (KLOR-CON M20) 20 MEQ tablet Take 1 tablet (20 mEq total) by mouth 3 (three) times daily.  . prochlorperazine (COMPAZINE) 10 MG tablet Take 1 tablet (10 mg total) by mouth every 6 (six) hours as needed for nausea or vomiting (Use for nausea and / or vomiting unresolved with ondansetron (Zofran).).  Marland Kitchen traMADol (ULTRAM) 50 MG tablet TAKE 1 TABLET BY MOUTH EVERY 12 HOURS AS NEEDED  . Travoprost, BAK Free, (TRAVATAN) 0.004 % SOLN ophthalmic solution Place 1 drop into both eyes at bedtime.  Marland Kitchen VITAMIN D PO Take 2 tablets by mouth daily. Gummies   No facility-administered encounter medications on file as of 12/07/2022.     There were no vitals filed for this visit. There is no height or weight on file to calculate BMI.   PHYSICAL EXAM GENERAL:alert, no distress and comfortable SKIN: no rash  EYES: sclera clear NECK: without mass LYMPH:  no palpable cervical or supraclavicular lymphadenopathy  LUNGS: clear with normal breathing effort HEART: regular rate & rhythm, no lower extremity edema ABDOMEN: abdomen soft, non-tender and normal bowel sounds NEURO: alert & oriented x 3 with fluent speech, no focal motor/sensory deficits Breast exam:  PAC without erythema    CBC    Component Value Date/Time   WBC 14.3 (H) 11/23/2022 1100   WBC 8.5 10/31/2021 1211   RBC 3.79 (L) 11/23/2022 1100   HGB 11.2 (L) 11/23/2022 1100   HCT 34.6 (L) 11/23/2022 1100   PLT 202 11/23/2022 1100   MCV 91.3 11/23/2022 1100   MCH 29.6 11/23/2022 1100   MCHC 32.4 11/23/2022 1100   RDW 17.7 (H) 11/23/2022 1100   LYMPHSABS 2.0 11/23/2022 1100   MONOABS 1.6 (H) 11/23/2022 1100   EOSABS 0.1 11/23/2022 1100   BASOSABS 0.0 11/23/2022 1100     CMP     Component Value Date/Time   NA 142 11/23/2022 1100   K 3.7 11/23/2022 1100   CL 109 11/23/2022  1100   CO2 30 11/23/2022 1100   GLUCOSE 83 11/23/2022 1100   BUN 21 11/23/2022 1100   CREATININE 1.01 11/23/2022 1100   CALCIUM 8.5 (L) 11/23/2022 1100   PROT 5.7 (L) 11/23/2022 1100   ALBUMIN 3.2 (L) 11/23/2022 1100   AST 17 11/23/2022 1100   ALT 30 11/23/2022 1100   ALKPHOS 124 11/23/2022 1100   BILITOT 0.7 11/23/2022 1100   GFRNONAA >60 11/23/2022 1100   GFRAA >60 02/24/2019 0207     ASSESSMENT & PLAN:Aaron Moss is a 77 y.o. 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  -Unfortunately suffered a stroke on 04/18/2021 therefore he did not proceed with upfront surgery  -S/p 8 cycles neoadjuvant Keytruda 05/11/21 - 09/28/21. He tolerated well with mild fatigue.  -attempted Whipple surgery 10/19/21, which was aborted due to obscuring of hepatic artery, s/p gastric bypass. -He began first line FOLFOX on 11/24/21, changed to maintenance Xeloda 05/2022 until he progressed in 08/2022 -Due to his existing neuropathy, he started FOLFIRI 08/2022, dose reduced with C3 for fatigue -Aaron Moss appears stable.  S/p cycle 4 FOLFIRI with G-CSF, tolerating moderately well with fatigue and stable neuropathy.  He has mild dysphagia with pills which is new.  Will monitor closely for now and see what CT next week shows.  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  I recommend to start B complex vitamin for nerve health.  We discussed additional medication such as gabapentin, Lyrica, or Cymbalta -He tried Lyrica when he had shingles, made him feel bad, but is open to gabapentin or Cymbalta if symptoms progress -  Encouraged him to use other nonpharmacologic remedies such as heat, massage, and hand exercises/as -Stable   PMS2-related Lynch syndrome (HNPCC4) -he has a personal history of stage pT3N0 colon cancer in 2000 and prostate cancer dx in 2015, s/p 8 weeks of IMRT with Dr. Dayton Scrape. -genetic testing and counseling on 04/07/21 confirmed a PMS2 mutation, as well as VUS in ATM, BLM, and SDHA.  PLAN:  No orders of the defined types were placed in this encounter.     All questions were answered. The patient knows to call the clinic with any problems, questions or concerns. No barriers to learning were detected. I spent *** counseling the patient face to face. The total time spent in the appointment was *** and more than 50% was on counseling, review of test results, and coordination of care.   Santiago Glad, NP-C @DATE @

## 2022-12-08 ENCOUNTER — Other Ambulatory Visit: Payer: Self-pay | Admitting: Nurse Practitioner

## 2022-12-09 ENCOUNTER — Inpatient Hospital Stay: Payer: Medicare Other

## 2022-12-10 ENCOUNTER — Other Ambulatory Visit: Payer: Self-pay

## 2022-12-12 ENCOUNTER — Encounter: Payer: Self-pay | Admitting: Hematology

## 2022-12-12 ENCOUNTER — Inpatient Hospital Stay (HOSPITAL_BASED_OUTPATIENT_CLINIC_OR_DEPARTMENT_OTHER): Payer: Medicare Other | Admitting: Hematology

## 2022-12-12 DIAGNOSIS — C170 Malignant neoplasm of duodenum: Secondary | ICD-10-CM

## 2022-12-12 NOTE — Assessment & Plan Note (Signed)
diagnosed 02/2021 by endo-/colonoscopy for weight loss and anemia. Treatment delayed due to his stroke on 04/18/21. S/p 8 cycles neoadjuvant Keytruda 05/11/21 - 09/28/21. He tolerated well with mild fatigue.  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 -He began FOLFOX on 11/24/21. He has tolerated well overall with some taste changes and mild fatigue -restaging CT scan 02/20/2022 showed PR -restaging CT from 05/15/2022 showed improved masslike enlargement of the duodenal as well as fold nodularity and thickening along the stomach, however it showed a new liver lesion, indeterminate, will obtain liver MRI  -Patient complained of worsening fatigue, last cycle chemo was held on 05/18/22. -Liver MRI in March 2024 showed benign liver lesions and a cystic lesion in pancreas, no evidence of liver metastasis.   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  No evidence of distant metastasis. -Due to the cancer progression, and his persistent peripheral neuropathy, I recommend change treatment to second line FOLFIRI, he started on 09/07/22, he is tolerating well so far  -Due to his moderate fatigue, I reduced irinotecan dose slightly from C3 -His performance status has been low due to significant fatigue, I will reduce his chemo dose further, and give him a month of chemo break if his next CT scan is stable. -will schedule restaging CT in next 2 weeks

## 2022-12-12 NOTE — Progress Notes (Signed)
[facility]   Telephone:([phone] Fax:(336) [phone]   Clinic Follow up Note   Patient Care Team: Corwin Levins, MD as PCP - General Jens Som, Madolyn Frieze, MD as PCP - Cardiology (Cardiology) Iva Boop, MD as Consulting Physician (Gastroenterology) Gean Birchwood, MD as Consulting Physician (Orthopedic Surgery) Sallye Lat, MD as Consulting Physician (Ophthalmology) Malachy Mood, MD as Consulting Physician (Oncology) 12/12/2022  I connected with Aaron Moss on 12/12/22 at  1:20 PM EDT by telephone and verified that I am speaking with the correct person using two identifiers.   I discussed the limitations, risks, security and privacy concerns of performing an evaluation and management service by telephone and the availability of in person appointments. I also discussed with the patient that there may be a patient responsible charge related to this service. The patient expressed understanding and agreed to proceed.   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 Treatment delayed due to his stroke on 04/18/21. S/p 8 cycles neoadjuvant Keytruda 05/11/21 - 09/28/21. He tolerated well with mild fatigue.  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 -He began FOLFOX on 11/24/21. He has tolerated well overall with some taste changes and mild fatigue -restaging CT scan 02/20/2022 showed PR -restaging CT from 05/15/2022 showed improved masslike enlargement of the duodenal as well as fold nodularity and thickening along the stomach, however it showed a new liver lesion, indeterminate, will obtain liver MRI  -Patient complained of worsening fatigue, last cycle chemo was held on 05/18/22. -Liver MRI in March 2024  showed benign liver lesions and a cystic lesion in pancreas, no evidence of liver metastasis.   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  No evidence of distant metastasis. -Due to the cancer progression, and his persistent peripheral neuropathy, I recommend change treatment to second line FOLFIRI, he started on 09/07/22, he is tolerating well so far  -Due to his moderate fatigue, I reduced irinotecan dose slightly from C3 -His performance status has been low due to significant fatigue, I will reduce his chemo dose further, and give him a month of chemo break if his next CT scan is stable. -Restaging CT scan from December 04, 2022 showed excellent response, the primary duodenal mass is not really measurable, no other signs of metastasis.  Assessment and Plan    Duodenal Cancer Significant response to chemotherapy with reduction in tumor size from 4.4 cm to not visible on recent CT scan. No evidence of metastasis. Patient experiencing fatigue from treatment. -Continue current chemotherapy regimen. -Consider maintenance therapy with one drug instead of two, to be discussed at next visit. -Next chemotherapy treatment scheduled for October 3rd, 2024. -We also discussed changing treatment to maintenance irinotecan every 2 weeks.  He will think about it.  Oral Mucositis Sore tongue, difficulty eating spicy foods. -Continue supportive care.  Peripheral Neuropathy Stable, no change. -Continue supportive care.   Follow-up Scheduled for October 3rd, 2024.         SUMMARY OF ONCOLOGIC HISTORY: Oncology History Overview Note   Cancer Staging  Adenocarcinoma of duodenum (HCC)  Staging form: Small Intestine - Adenocarcinoma, AJCC  8th Edition - Clinical stage from 03/18/2021: Stage IIIA (cTX, cN1, cM0) - Signed by Malachy Mood, MD on 06/22/2021 Stage prefix: Initial diagnosis     Adenocarcinoma of duodenum (HCC)  03/01/2021 Imaging   EXAM: ABDOMEN ULTRASOUND COMPLETE  IMPRESSION: 1. Simple appearing right renal cyst. 2. Otherwise negative abdominal ultrasound   03/18/2021 Procedure   Upper Endoscopy/Colonoscopy, Dr. Myrtie Neither  Upper Endoscopy Impression: - Normal esophagus. - A single submucosal papule (nodule) found in the stomach. - Duodenal mass. Biopsied. Concerning for malignancy.  Colonoscopy Impression: - Diverticulosis in the entire examined colon. - The examined portion of the ileum was normal. - Patent end-to-end colo-colonic anastomosis, characterized by healthy appearing mucosa. - Internal hemorrhoids. - No specimens collected.   03/18/2021 Initial Biopsy   Diagnosis Duodenum, Biopsy - INVASIVE MODERATE TO POORLY DIFFERENTIATED ADENOCARCINOMA   03/18/2021 Cancer Staging   Staging form: Small Intestine - Adenocarcinoma, AJCC 8th Edition - Clinical stage from 03/18/2021: Stage IIIA (cTX, cN1, cM0) - Signed by Malachy Mood, MD on 06/22/2021 Stage prefix: Initial diagnosis   03/22/2021 Initial Diagnosis   Adenocarcinoma of duodenum (HCC)   04/01/2021 Imaging   EXAM: CT CHEST, ABDOMEN, AND PELVIS WITH CONTRAST  IMPRESSION: 1. Circumferential wall thickening of the first portion of the duodenum, in keeping with known primary malignancy. 2. Enlarged, hypodense lymph nodes anterior to the pancreatic head and in the portacaval station, concerning for nodal metastatic disease. 3. No other evidence of metastatic disease in the chest, abdomen, or pelvis. 4. Incidental note of a fluid attenuation lesion within the proximal pancreatic body measuring 1.9 x 1.4 cm, with prominence of the pancreatic duct distally, up to 0.6 cm. This lesion was present on remote prior examination dated 05/04/2009, and has  slightly increased in size over a long period of time. This is consistent with a small IPMN and benign given indolent behavior over greater than 10 years. 5. Background of very fine centrilobular pulmonary nodules, most concentrated in the lung apices, most commonly seen in smoking-related respiratory bronchiolitis. 6. Coronary artery disease.   Aortic Atherosclerosis (ICD10-I70.0).    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 Of note, a variant of uncertain significance was detected in the ATM (p.A869G), BLM (c.800-3T>G), and SDHA (p.V632F) genes. Report date is 04/27/2021.  The CancerNext-Expanded gene panel offered by [facility] and includes sequencing, rearrangement, and RNA analysis for the following 77 genes: AIP, ALK, APC, ATM, AXIN2, BAP1, BARD1, BLM, BMPR1A, BRCA1, BRCA2, BRIP1, CDC73, CDH1, CDK4, CDKN1B, CDKN2A, CHEK2, CTNNA1, DICER1, FANCC, FH, FLCN, GALNT12, KIF1B, LZTR1, MAX, MEN1, MET, MLH1, MSH2, MSH3, MSH6, MUTYH, NBN, NF1, NF2, NTHL1, PALB2, PHOX2B, PMS2, POT1, PRKAR1A, PTCH1, PTEN, RAD51C, RAD51D, RB1, RECQL, RET, SDHA, SDHAF2, SDHB, SDHC, SDHD, SMAD4, SMARCA4, SMARCB1, SMARCE1, STK11, SUFU, TMEM127, TP53, TSC1, TSC2, VHL and XRCC2 (sequencing and deletion/duplication); EGFR, EGLN1, HOXB13, KIT, MITF, PDGFRA, POLD1, and POLE (sequencing only); EPCAM and GREM1 (deletion/duplication only).    05/11/2021 - 09/28/2021 Chemotherapy   Patient is on Treatment Plan : COLORECTAL Pembrolizumab (200) q21d     11/24/2021 - 05/03/2022 Chemotherapy   Patient is on Treatment Plan : COLORECTAL FOLFOX q14d x 4 months     02/20/2022 Imaging    IMPRESSION: 1. Distal stomach/duodenal mass is decreased in size when compared with prior exam. 2. Stable upper abdominal adenopathy. 3. 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 4. Aortic Atherosclerosis (ICD10-I70.0).   05/15/2022 Imaging    IMPRESSION: Improved masslike enlargement of the duodenal as well as fold nodularity and thickening along the stomach. In addition there are some small nodes identified in the upper abdomen which are similar overall compared to the study of December 2023   However there is a new spiculated nodule in the middle lobe which is worrisome. In addition there are some small low-attenuation liver lesions which are too small to completely characterize but were not clearly seen on the previous examination. Recommend further evaluation. For the liver lesions in particular a Eovist liver MRI may be of some benefit as clinically directed.   Persistent cystic lesion along the midbody of the pancreas with associated duct dilatation. A cystic neoplasm of the pancreas is possible.   06/06/2022 Imaging   MR Abdomen W Wo Contrast     IMPRESSION: 1. No definite evidence of hepatic metastatic disease. Scattered punctate foci of T2 hyperintensity throughout the liver are not associated with any suspicious enhancement or restricted diffusion and are favored to reflect small cysts or biliary hamartomas. Some of these are suboptimally characterized based on their small size, and given nonvisualization on older prior studies, attention on follow-up CT in 3-6 months recommended. 2. Unchanged residual irregular thickening of the walls of the distal stomach and proximal duodenum. 3. 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 This lesion is unchanged in size from recent CT, although has slowly enlarged from older prior examinations. Recommend follow-up abdominal MRI in 1 year. 4. Stable mild extrahepatic biliary dilatation status post cholecystectomy. 5. Aortic and branch vessel atherosclerosis, better seen on  CT.   09/07/2022 -  Chemotherapy   Patient is on Treatment Plan : COLORECTAL FOLFIRI q14d       Discussed the use of AI scribe software for clinical note transcription with the patient, who gave verbal consent to proceed.  History of Present Illness   Saajan, a patient with a known diagnosis of duodenal cancer, has been undergoing chemotherapy. He reports significant fatigue from the treatment. He also reports a sore tongue, which has made it difficult for him to eat spicy foods. He has ongoing neuropathy, which has not changed. His bowel movements have returned to normal and his stool is no longer black. He has been taking iron pills twice a day. He has recently finished a course of prednisone, which he reports gave him an appetite.         REVIEW OF SYSTEMS:   Constitutional: Denies fevers, chills or abnormal weight loss Eyes: Denies blurriness of vision Ears, nose, mouth, throat, and face: Denies mucositis or sore throat Respiratory: Denies cough, dyspnea or wheezes Cardiovascular: Denies palpitation, chest discomfort or lower extremity swelling Gastrointestinal:  Denies nausea, heartburn or change in bowel habits Skin: Denies abnormal skin rashes Lymphatics: Denies new lymphadenopathy or easy bruising Neurological:Denies numbness, tingling or new weaknesses Behavioral/Psych: Mood is stable, no new changes  All other systems were reviewed with the patient and are negative.  MEDICAL HISTORY:  Past Medical History:  Diagnosis Date   Anemia 10/31/2021   Dx IDA   Carotid stenosis, left    Colon cancer [facility]) 2000   Coronary artery disease    per Dr. 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   GERD (gastroesophageal reflux  disease)    Glaucoma    History of chemotherapy 2000   colon cancer   History of radiation therapy 08/06/13- 10/03/13   prostate 7800 cGy 40 sessions, seminal vesicles 5600 cGy 40 sessions   Hyperlipidemia     Hypertension    Left knee DJD    Prostate cancer (HCC) 05/23/2013   gleason 3+4=7, volume 11.5 ml   Stroke (HCC) 04/18/2021   TIA (transient ischemic attack)     SURGICAL HISTORY: Past Surgical History:  Procedure Laterality Date   CHOLECYSTECTOMY N/A 10/19/2021   Procedure: CHOLECYSTECTOMY;  Surgeon: Almond Lint, MD;  Location: [facility] OR;  Service: General;  Laterality: N/A;   COLON SURGERY  2000   colon cancer   COLONOSCOPY     ENDARTERECTOMY Left 04/25/2021   Procedure: LEFT ENDARTERECTOMY CAROTID;  Surgeon: Cephus Shelling, MD;  Location: [facility] OR;  Service: Vascular;  Laterality: Left;   EUS     pancreatic cyst   KNEE ARTHROSCOPY  2007   LT- [facility]   LAPAROSCOPY N/A 10/19/2021   Procedure: LAPAROSCOPY DIAGNOSTIC;  Surgeon: Almond Lint, MD;  Location: [facility] OR;  Service: General;  Laterality: N/A;  GENERAL AND TAP BLOCK   PATCH ANGIOPLASTY Left 04/25/2021   Procedure: PATCH ANGIOPLASTY XENOSURE 1CM X 6CM;  Surgeon: Cephus Shelling, MD;  Location: [facility] OR;  Service: Vascular;  Laterality: Left;   PORTACATH PLACEMENT Left 11/17/2021   Procedure: PORT PLACEMENT;  Surgeon: Almond Lint, MD;  Location: [facility] OR;  Service: General;  Laterality: Left;   PROSTATE BIOPSY  05/23/13   gleason 7, volume 11.5 ml   WHIPPLE PROCEDURE N/A 10/19/2021   Procedure: ATTEMPTED WHIPPLE PROCEDURE  WITH GASTRODUODENAL BYPASS;  Surgeon: Almond Lint, MD;  Location: [facility] OR;  Service: General;  Laterality: N/A;  GENERAL AND TAP BLOCK    I have reviewed the social history and family history with the patient and they are unchanged from previous note.  ALLERGIES:  is allergic to lyrica [pregabalin] and ciprofloxacin.  MEDICATIONS:  Current Outpatient Medications  Medication Sig Dispense Refill   amLODipine (NORVASC) 10 MG tablet Take 1 tablet by mouth once daily 90 tablet 0   aspirin EC 81 MG EC tablet Take 1 tablet (81 mg total) by mouth daily. Swallow whole. 30 tablet 11   atorvastatin (LIPITOR) 40 MG  tablet Take 1 tablet by mouth once daily 90 tablet 2   b complex vitamins capsule Take 1 capsule by mouth daily.     benazepril (LOTENSIN) 40 MG tablet Take 1 tablet by mouth once daily 90 tablet 2   carvedilol (COREG) 12.5 MG tablet TAKE 1 TABLET BY MOUTH 2 TIMES DAILY. 180 tablet 3   cyanocobalamin (VITAMIN B12) 500 MCG tablet Take 500 mcg by mouth daily.     DULoxetine (CYMBALTA) 30 MG capsule TAKE 1 CAPSULE BY MOUTH ONCE DAILY FOR 7 DAYS AND THEN TAKE 2 CAPSULES BY MOUTH ONCE DAILY FOR 24 DAYS 55 capsule 0   iron polysaccharides (NIFEREX) 150 MG capsule TAKE 1 CAPSULE BY MOUTH TWICE A DAY 60 capsule 2   methylPREDNISolone (MEDROL DOSEPAK) 4 MG TBPK tablet Take 5 tablets (20 mg total) by mouth daily with breakfast for 2 days, THEN 4 tablets (16 mg total) daily with breakfast for 2 days, THEN 3 tablets (12 mg total) daily with breakfast for 2 days, THEN 2 tablets (8 mg total) daily with breakfast for 2 days, THEN 1 tablet (4 mg total) daily with breakfast for 2 days. 30 tablet  0   potassium chloride SA (KLOR-CON M20) 20 MEQ tablet Take 1 tablet (20 mEq total) by mouth 3 (three) times daily. 90 tablet 2   prochlorperazine (COMPAZINE) 10 MG tablet Take 1 tablet (10 mg total) by mouth every 6 (six) hours as needed for nausea or vomiting (Use for nausea and / or vomiting unresolved with ondansetron (Zofran).). 30 tablet 0   traMADol (ULTRAM) 50 MG tablet TAKE 1 TABLET BY MOUTH EVERY 12 HOURS AS NEEDED 60 tablet 2   Travoprost, BAK Free, (TRAVATAN) 0.004 % SOLN ophthalmic solution Place 1 drop into both eyes at bedtime.     VITAMIN D PO Take 2 tablets by mouth daily. Gummies     No current facility-administered medications for this visit.    PHYSICAL EXAMINATION: Not performed   LABORATORY DATA:  I have reviewed the data as listed    Latest Ref Rng & Units 11/23/2022   11:00 AM 11/09/2022   10:20 AM 10/26/2022    9:17 AM  CBC  WBC 4.0 - 10.5 K/uL 14.3  10.4  9.6   Hemoglobin 13.0 - 17.0 g/dL  16.1  09.6  04.5   Hematocrit 39.0 - 52.0 % 34.6  34.9  34.4   Platelets 150 - 400 K/uL 202  197  215         Latest Ref Rng & Units 11/23/2022   11:00 AM 11/09/2022   10:20 AM 10/26/2022    9:17 AM  CMP  Glucose 70 - 99 mg/dL 83  409  98   BUN 8 - 23 mg/dL 21  20  17    Creatinine 0.61 - 1.24 mg/dL 8.11  9.14  7.82   Sodium 135 - 145 mmol/L 142  143  144   Potassium 3.5 - 5.1 mmol/L 3.7  3.7  4.2   Chloride 98 - 111 mmol/L 109  110  112   CO2 22 - 32 mmol/L 30  27  29    Calcium 8.9 - 10.3 mg/dL 8.5  8.6  8.4   Total Protein 6.5 - 8.1 g/dL 5.7  5.9  5.9   Total Bilirubin 0.3 - 1.2 mg/dL 0.7  0.8  0.7   Alkaline Phos 38 - 126 U/L 124  132  130   AST 15 - 41 U/L 17  20  24    ALT 0 - 44 U/L 30  22  25        RADIOGRAPHIC STUDIES: I have personally reviewed the radiological images as listed and agreed with the findings in the report. No results found.     I discussed the assessment and treatment plan with the patient. The patient was provided an opportunity to ask questions and all were answered. The patient agreed with the plan and demonstrated an understanding of the instructions.   The patient was advised to call back or seek an in-person evaluation if the symptoms worsen or if the condition fails to improve as anticipated.  I provided 15 minutes of non face-to-face telephone visit time during this encounter, and > 50% was spent counseling as documented under my assessment & plan.     Malachy Mood, MD 12/12/22

## 2022-12-20 MED FILL — Dexamethasone Sodium Phosphate Inj 100 MG/10ML: INTRAMUSCULAR | Qty: 1 | Status: AC

## 2022-12-20 NOTE — Assessment & Plan Note (Signed)
diagnosed 02/2021 by endo-/colonoscopy for weight loss and anemia. Treatment delayed due to his stroke on 04/18/21. S/p 8 cycles neoadjuvant Keytruda 05/11/21 - 09/28/21. He tolerated well with mild fatigue.  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 -He began FOLFOX on 11/24/21. He has tolerated well overall with some taste changes and mild fatigue -restaging CT scan 02/20/2022 showed PR -restaging CT from 05/15/2022 showed improved masslike enlargement of the duodenal as well as fold nodularity and thickening along the stomach, however it showed a new liver lesion, indeterminate, will obtain liver MRI  -Patient complained of worsening fatigue, last cycle chemo was held on 05/18/22. -Liver MRI in March 2024 showed benign liver lesions and a cystic lesion in pancreas, no evidence of liver metastasis.   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  No evidence of distant metastasis. -Due to the cancer progression, and his persistent peripheral neuropathy, I recommend change treatment to second line FOLFIRI, he started on 09/07/22, he is tolerating well so far  -Due to his moderate fatigue, I reduced irinotecan dose slightly from C3 -His performance status has been low due to significant fatigue, required chemo dose reduction and chemo break  --Restaging CT scan from December 04, 2022 showed excellent response, the primary duodenal mass is not really measurable, no other signs of metastasis.

## 2022-12-21 ENCOUNTER — Inpatient Hospital Stay: Payer: Medicare Other

## 2022-12-21 ENCOUNTER — Inpatient Hospital Stay: Payer: Medicare Other | Attending: Nurse Practitioner

## 2022-12-21 ENCOUNTER — Inpatient Hospital Stay: Payer: Medicare Other | Attending: Nurse Practitioner | Admitting: Hematology

## 2022-12-21 VITALS — BP 150/65 | HR 78 | Temp 97.9°F | Resp 16 | Ht 66.0 in | Wt 222.1 lb

## 2022-12-21 DIAGNOSIS — Z7982 Long term (current) use of aspirin: Secondary | ICD-10-CM | POA: Diagnosis not present

## 2022-12-21 DIAGNOSIS — C170 Malignant neoplasm of duodenum: Secondary | ICD-10-CM

## 2022-12-21 DIAGNOSIS — Z79899 Other long term (current) drug therapy: Secondary | ICD-10-CM | POA: Insufficient documentation

## 2022-12-21 DIAGNOSIS — M25569 Pain in unspecified knee: Secondary | ICD-10-CM | POA: Insufficient documentation

## 2022-12-21 DIAGNOSIS — G629 Polyneuropathy, unspecified: Secondary | ICD-10-CM | POA: Diagnosis not present

## 2022-12-21 DIAGNOSIS — Z5111 Encounter for antineoplastic chemotherapy: Secondary | ICD-10-CM | POA: Diagnosis present

## 2022-12-21 DIAGNOSIS — Z8546 Personal history of malignant neoplasm of prostate: Secondary | ICD-10-CM | POA: Diagnosis not present

## 2022-12-21 DIAGNOSIS — Z923 Personal history of irradiation: Secondary | ICD-10-CM | POA: Diagnosis not present

## 2022-12-21 DIAGNOSIS — Z95828 Presence of other vascular implants and grafts: Secondary | ICD-10-CM

## 2022-12-21 DIAGNOSIS — D508 Other iron deficiency anemias: Secondary | ICD-10-CM | POA: Diagnosis present

## 2022-12-21 DIAGNOSIS — Z9221 Personal history of antineoplastic chemotherapy: Secondary | ICD-10-CM | POA: Diagnosis not present

## 2022-12-21 DIAGNOSIS — D500 Iron deficiency anemia secondary to blood loss (chronic): Secondary | ICD-10-CM

## 2022-12-21 DIAGNOSIS — Z9884 Bariatric surgery status: Secondary | ICD-10-CM | POA: Diagnosis not present

## 2022-12-21 LAB — CEA (IN HOUSE-CHCC): CEA (CHCC-In House): 6.15 ng/mL — ABNORMAL HIGH (ref 0.00–5.00)

## 2022-12-21 LAB — CBC WITH DIFFERENTIAL (CANCER CENTER ONLY)
Abs Immature Granulocytes: 0.02 10*3/uL (ref 0.00–0.07)
Basophils Absolute: 0 10*3/uL (ref 0.0–0.1)
Basophils Relative: 1 %
Eosinophils Absolute: 0.1 10*3/uL (ref 0.0–0.5)
Eosinophils Relative: 2 %
HCT: 37.6 % — ABNORMAL LOW (ref 39.0–52.0)
Hemoglobin: 11.9 g/dL — ABNORMAL LOW (ref 13.0–17.0)
Immature Granulocytes: 0 %
Lymphocytes Relative: 14 %
Lymphs Abs: 0.9 10*3/uL (ref 0.7–4.0)
MCH: 29.5 pg (ref 26.0–34.0)
MCHC: 31.6 g/dL (ref 30.0–36.0)
MCV: 93.3 fL (ref 80.0–100.0)
Monocytes Absolute: 0.9 10*3/uL (ref 0.1–1.0)
Monocytes Relative: 13 %
Neutro Abs: 4.7 10*3/uL (ref 1.7–7.7)
Neutrophils Relative %: 70 %
Platelet Count: 162 10*3/uL (ref 150–400)
RBC: 4.03 MIL/uL — ABNORMAL LOW (ref 4.22–5.81)
RDW: 19.1 % — ABNORMAL HIGH (ref 11.5–15.5)
WBC Count: 6.6 10*3/uL (ref 4.0–10.5)
nRBC: 0 % (ref 0.0–0.2)

## 2022-12-21 LAB — CMP (CANCER CENTER ONLY)
ALT: 27 U/L (ref 0–44)
AST: 21 U/L (ref 15–41)
Albumin: 3.3 g/dL — ABNORMAL LOW (ref 3.5–5.0)
Alkaline Phosphatase: 86 U/L (ref 38–126)
Anion gap: 3 — ABNORMAL LOW (ref 5–15)
BUN: 25 mg/dL — ABNORMAL HIGH (ref 8–23)
CO2: 29 mmol/L (ref 22–32)
Calcium: 8.9 mg/dL (ref 8.9–10.3)
Chloride: 111 mmol/L (ref 98–111)
Creatinine: 0.98 mg/dL (ref 0.61–1.24)
GFR, Estimated: >60 mL/min (ref >60)
Glucose, Bld: 115 mg/dL — ABNORMAL HIGH (ref 70–99)
Potassium: 3.8 mmol/L (ref 3.5–5.1)
Sodium: 143 mmol/L (ref 135–145)
Total Bilirubin: 1.1 mg/dL (ref 0.3–1.2)
Total Protein: 5.9 g/dL — ABNORMAL LOW (ref 6.5–8.1)

## 2022-12-21 MED ORDER — ATROPINE SULFATE 1 MG/ML IV SOLN
0.5000 mg | Freq: Once | INTRAVENOUS | Status: AC | PRN
  Administered 2022-12-21: 0.5 mg via INTRAVENOUS
  Filled 2022-12-21: qty 1

## 2022-12-21 MED ORDER — DULOXETINE HCL 30 MG PO CPEP: 30.0000 mg | ORAL_CAPSULE | Freq: Every day | ORAL | 1 refills | Status: DC

## 2022-12-21 MED ORDER — SODIUM CHLORIDE 0.9 % IV SOLN
120.0000 mg/m2 | Freq: Once | INTRAVENOUS | Status: AC
  Administered 2022-12-21: 260 mg via INTRAVENOUS
  Filled 2022-12-21: qty 13

## 2022-12-21 MED ORDER — SODIUM CHLORIDE 0.9% FLUSH
10.0000 mL | INTRAVENOUS | Status: DC | PRN
  Administered 2022-12-21: 10 mL

## 2022-12-21 MED ORDER — SODIUM CHLORIDE 0.9 % IV SOLN
400.0000 mg/m2 | Freq: Once | INTRAVENOUS | Status: AC
  Administered 2022-12-21: 844 mg via INTRAVENOUS
  Filled 2022-12-21: qty 17.5

## 2022-12-21 MED ORDER — PALONOSETRON HCL INJECTION 0.25 MG/5ML
0.2500 mg | Freq: Once | INTRAVENOUS | Status: AC
  Administered 2022-12-21: 0.25 mg via INTRAVENOUS
  Filled 2022-12-21: qty 5

## 2022-12-21 MED ORDER — HEPARIN SOD (PORK) LOCK FLUSH 100 UNIT/ML IV SOLN
500.0000 [IU] | Freq: Once | INTRAVENOUS | Status: AC | PRN
  Administered 2022-12-21: 500 [IU]

## 2022-12-21 MED ORDER — SODIUM CHLORIDE 0.9% FLUSH
10.0000 mL | Freq: Once | INTRAVENOUS | Status: AC
  Administered 2022-12-21: 10 mL

## 2022-12-21 MED ORDER — SODIUM CHLORIDE 0.9 % IV SOLN: Freq: Once | INTRAVENOUS | Status: AC

## 2022-12-21 MED ORDER — SODIUM CHLORIDE 0.9 % IV SOLN
10.0000 mg | Freq: Once | INTRAVENOUS | Status: AC
  Administered 2022-12-21: 10 mg via INTRAVENOUS
  Filled 2022-12-21: qty 10

## 2022-12-21 NOTE — Patient Instructions (Signed)
[facility]  Discharge Instructions: Thank you for choosing [facility] to provide your oncology and hematology care.   If you have a lab appointment with the Cancer Center, please go directly to the Cancer Center and check in at the registration area.   Wear comfortable clothing and clothing appropriate for easy access to any Portacath or PICC line.   We strive to give you quality time with your provider. You may need to reschedule your appointment if you arrive late (15 or more minutes).  Arriving late affects you and other patients whose appointments are after yours.  Also, if you miss three or more appointments without notifying the office, you may be dismissed from the clinic at the provider's discretion.      For prescription refill requests, have your pharmacy contact our office and allow 72 hours for refills to be completed.    Today you received the following chemotherapy and/or immunotherapy agents: Irinotecan/Leucovorin/Fluorouracil     To help prevent nausea and vomiting after your treatment, we encourage you to take your nausea medication as directed.  BELOW ARE SYMPTOMS THAT SHOULD BE REPORTED IMMEDIATELY: *FEVER GREATER THAN 100.4 F (38 C) OR HIGHER *CHILLS OR SWEATING *NAUSEA AND VOMITING THAT IS NOT CONTROLLED WITH YOUR NAUSEA MEDICATION *UNUSUAL SHORTNESS OF BREATH *UNUSUAL BRUISING OR BLEEDING *URINARY PROBLEMS (pain or burning when urinating, or frequent urination) *BOWEL PROBLEMS (unusual diarrhea, constipation, pain near the anus) TENDERNESS IN MOUTH AND THROAT WITH OR WITHOUT PRESENCE OF ULCERS (sore throat, sores in mouth, or a toothache) UNUSUAL RASH, SWELLING OR PAIN  UNUSUAL VAGINAL DISCHARGE OR ITCHING   Items with * indicate a potential emergency and should be followed up as soon as possible or go to the Emergency Department if any problems should occur.  Please show the CHEMOTHERAPY ALERT CARD or  IMMUNOTHERAPY ALERT CARD at check-in to the Emergency Department and triage nurse.  Should you have questions after your visit or need to cancel or reschedule your appointment, please contact [facility]  Dept: [phone]  and follow the prompts.  Office hours are 8:00 a.m. to 4:30 p.m. Monday - Friday. Please note that voicemails left after 4:00 p.m. may not be returned until the following business day.  We are closed weekends and major holidays. You have access to a nurse at all times for urgent questions. Please call the main number to the clinic Dept: [phone] and follow the prompts.   For any non-urgent questions, you may also contact your provider using MyChart. We now offer e-Visits for anyone 27 and older to request care online for non-urgent symptoms. For details visit [email or web address].   Also download the MyChart app! Go to the app store, search "MyChart", open the app, select [place], and log in with your MyChart username and password.

## 2022-12-21 NOTE — Progress Notes (Signed)
[facility]   [phone] [phone]   Clinic Follow up Note   Patient Care Team: Corwin Levins, MD as PCP - General Jens Som, Madolyn Frieze, MD as PCP - Cardiology (Cardiology) Iva Boop, MD as Consulting Physician (Gastroenterology) Gean Birchwood, MD as Consulting Physician (Orthopedic Surgery) Sallye Lat, MD as Consulting Physician (Ophthalmology) Malachy Mood, MD as Consulting Physician (Oncology)  Date of Service:  12/21/2022  CHIEF COMPLAINT: f/u of duodenal cancer  CURRENT THERAPY:  Third line FOLFIRI  Oncology History   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 Treatment delayed due to his stroke on 04/18/21. S/p 8 cycles neoadjuvant Keytruda 05/11/21 - 09/28/21. He tolerated well with mild fatigue.  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 -He began FOLFOX on 11/24/21. He has tolerated well overall with some taste changes and mild fatigue -restaging CT scan 02/20/2022 showed PR -restaging CT from 05/15/2022 showed improved masslike enlargement of the duodenal as well as fold nodularity and thickening along the stomach, however it showed a new liver lesion, indeterminate, will obtain liver MRI  -Patient complained of worsening fatigue, last cycle chemo was held on 05/18/22. -Liver MRI in March 2024 showed benign liver lesions and a cystic lesion in pancreas, no evidence of liver metastasis.   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  No evidence of distant metastasis. -Due to the cancer progression, and his persistent peripheral neuropathy, I recommend change  treatment to second line FOLFIRI, he started on 09/07/22, he is tolerating well so far  -Due to his moderate fatigue, I reduced irinotecan dose slightly from C3 -His performance status has been low due to significant fatigue, required chemo dose reduction and chemo break  --Restaging CT scan from December 04, 2022 showed excellent response, the primary duodenal mass is not really measurable, no other signs of metastasis.    Assessment and Plan    Adenocarcinoma of duodenum Good response to chemotherapy with improved energy levels and weight gain. Neuropathy remains stable with Cymbalta. Discussed the balance between disease control and quality of life. -Change to maintenance therapy with Irinotecan alone without 5-fu pump, starting today. -Next treatments scheduled for October 24, November 7, and November 21.  Neuropathy Stable with Cymbalta, experiencing tingling and numbness in the tips of fingers. -Continue Cymbalta. -Refill prescription for Cymbalta at the same pharmacy.  Knee Pain Experiencing knee pain, possibly related to weight gain. -Continue Tramadol as needed for pain.  General Health Maintenance -Continue exercise regimen for overall health and weight management. -Plan for potential treatment break during the holidays. -Schedule follow-up appointments for October 24, November 7, and November 21.     Plan -Given the good response on restaging CT scan and his fatigue, we will change his chemotherapy to irinotecan maintenance therapy every 2 weeks, starting today -Follow-up in 3 weeks before treatment, postpone next cycle 4 weekly due to his travel    SUMMARY OF ONCOLOGIC HISTORY: Oncology History Overview Note   Cancer Staging  Adenocarcinoma of duodenum [facility]) Staging form: Small Intestine - Adenocarcinoma, AJCC 8th Edition - Clinical stage from 03/18/2021: Stage IIIA (cTX, cN1, cM0) - Signed by Malachy Mood, MD on 06/22/2021 Stage prefix: Initial diagnosis  Adenocarcinoma of duodenum (HCC)  03/01/2021 Imaging   EXAM: ABDOMEN ULTRASOUND COMPLETE  IMPRESSION: 1. Simple appearing right renal cyst. 2. Otherwise negative abdominal ultrasound   03/18/2021 Procedure   Upper Endoscopy/Colonoscopy, Dr. Myrtie Neither  Upper Endoscopy Impression: - Normal esophagus. - A single submucosal papule (nodule) found in the stomach. - Duodenal mass. Biopsied. Concerning for malignancy.  Colonoscopy Impression: - Diverticulosis in the entire examined colon. - The examined portion of the ileum was normal. - Patent end-to-end colo-colonic anastomosis, characterized by healthy appearing mucosa. - Internal hemorrhoids. - No specimens collected.   03/18/2021 Initial Biopsy   Diagnosis Duodenum, Biopsy - INVASIVE MODERATE TO POORLY DIFFERENTIATED ADENOCARCINOMA   03/18/2021 Cancer Staging   Staging form: Small Intestine - Adenocarcinoma, AJCC 8th Edition - Clinical stage from 03/18/2021: Stage IIIA (cTX, cN1, cM0) - Signed by Malachy Mood, MD on 06/22/2021 Stage prefix: Initial diagnosis   03/22/2021 Initial Diagnosis   Adenocarcinoma of duodenum (HCC)   04/01/2021 Imaging   EXAM: CT CHEST, ABDOMEN, AND PELVIS WITH CONTRAST  IMPRESSION: 1. Circumferential wall thickening of the first portion of the duodenum, in keeping with known primary malignancy. 2. Enlarged, hypodense lymph nodes anterior to the pancreatic head and in the portacaval station, concerning for nodal metastatic disease. 3. No other evidence of metastatic disease in the chest, abdomen, or pelvis. 4. Incidental note of a fluid attenuation lesion within the proximal pancreatic body measuring 1.9 x 1.4 cm, with prominence of the pancreatic duct distally, up to 0.6 cm. This lesion was present on remote prior examination dated 05/04/2009, and has slightly increased in size over a long period of time. This is consistent with a small IPMN and benign given indolent behavior over greater than 10  years. 5. Background of very fine centrilobular pulmonary nodules, most concentrated in the lung apices, most commonly seen in smoking-related respiratory bronchiolitis. 6. Coronary artery disease.   Aortic Atherosclerosis (ICD10-I70.0).    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 Of note, a variant of uncertain significance was detected in the ATM (p.A869G), BLM (c.800-3T>G), and SDHA (p.V632F) genes. Report date is 04/27/2021.  The CancerNext-Expanded gene panel offered by [facility] and includes sequencing, rearrangement, and RNA analysis for the following 77 genes: AIP, ALK, APC, ATM, AXIN2, BAP1, BARD1, BLM, BMPR1A, BRCA1, BRCA2, BRIP1, CDC73, CDH1, CDK4, CDKN1B, CDKN2A, CHEK2, CTNNA1, DICER1, FANCC, FH, FLCN, GALNT12, KIF1B, LZTR1, MAX, MEN1, MET, MLH1, MSH2, MSH3, MSH6, MUTYH, NBN, NF1, NF2, NTHL1, PALB2, PHOX2B, PMS2, POT1, PRKAR1A, PTCH1, PTEN, RAD51C, RAD51D, RB1, RECQL, RET, SDHA, SDHAF2, SDHB, SDHC, SDHD, SMAD4, SMARCA4, SMARCB1, SMARCE1, STK11, SUFU, TMEM127, TP53, TSC1, TSC2, VHL and XRCC2 (sequencing and deletion/duplication); EGFR, EGLN1, HOXB13, KIT, MITF, PDGFRA, POLD1, and POLE (sequencing only); EPCAM and GREM1 (deletion/duplication only).    05/11/2021 - 09/28/2021 Chemotherapy   Patient is on Treatment Plan : COLORECTAL Pembrolizumab (200) q21d     11/24/2021 - 05/03/2022 Chemotherapy   Patient is on Treatment Plan : COLORECTAL FOLFOX q14d x 4 months     02/20/2022 Imaging    IMPRESSION: 1. Distal stomach/duodenal mass is decreased in size when compared with prior exam. 2. Stable upper abdominal adenopathy. 3. 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 4. Aortic Atherosclerosis (ICD10-I70.0).   05/15/2022 Imaging    IMPRESSION: Improved masslike  enlargement of the duodenal as well as  fold nodularity and thickening along the stomach. In addition there are some small nodes identified in the upper abdomen which are similar overall compared to the study of December 2023   However there is a new spiculated nodule in the middle lobe which is worrisome. In addition there are some small low-attenuation liver lesions which are too small to completely characterize but were not clearly seen on the previous examination. Recommend further evaluation. For the liver lesions in particular a Eovist liver MRI may be of some benefit as clinically directed.   Persistent cystic lesion along the midbody of the pancreas with associated duct dilatation. A cystic neoplasm of the pancreas is possible.   06/06/2022 Imaging   MR Abdomen W Wo Contrast     IMPRESSION: 1. No definite evidence of hepatic metastatic disease. Scattered punctate foci of T2 hyperintensity throughout the liver are not associated with any suspicious enhancement or restricted diffusion and are favored to reflect small cysts or biliary hamartomas. Some of these are suboptimally characterized based on their small size, and given nonvisualization on older prior studies, attention on follow-up CT in 3-6 months recommended. 2. Unchanged residual irregular thickening of the walls of the distal stomach and proximal duodenum. 3. 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 This lesion is unchanged in size from recent CT, although has slowly enlarged from older prior examinations. Recommend follow-up abdominal MRI in 1 year. 4. Stable mild extrahepatic biliary dilatation status post cholecystectomy. 5. Aortic and branch vessel atherosclerosis, better seen on CT.   09/07/2022 -  Chemotherapy   Patient is on Treatment Plan : COLORECTAL FOLFIRI q14d        Discussed the use of AI scribe software for clinical  note transcription with the patient, who gave verbal consent to proceed.  History of Present Illness   The patient, a 77 year old with a history of adenocarcinoma, presents for a follow-up visit. He reports feeling "okay" overall and has noticed an improvement in his energy levels since his last chemotherapy treatment, which was a month ago. He was given a two-week break from treatment, which he believes has contributed to his improved well-being.  The patient continues to experience neuropathy, which remains unchanged. He describes a lack of feeling in the tips of his fingers, making it difficult to pick up objects. However, he does not drop things frequently. He is currently taking Cymbalta for the neuropathy, which helps manage the tingling sensation.  The patient has also gained ten pounds since his last visit. He has been taking Tramadol for knee pain, which has increased with his recent weight gain. He is also taking iron supplements and has enough refills for his current medications.         All other systems were reviewed with the patient and are negative.  MEDICAL HISTORY:  Past Medical History:  Diagnosis Date   Anemia 10/31/2021   Dx IDA   Carotid stenosis, left    Colon cancer (HCC) 2000   Coronary artery disease    per Dr. 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   GERD (gastroesophageal reflux disease)    Glaucoma    History of chemotherapy 2000   colon cancer   History of radiation therapy 08/06/13- 10/03/13   prostate 7800 cGy 40 sessions, seminal vesicles 5600 cGy 40 sessions   Hyperlipidemia    Hypertension    Left knee DJD  Prostate cancer (HCC) 05/23/2013   gleason 3+4=7, volume 11.5 ml   Stroke (HCC) 04/18/2021   TIA (transient ischemic attack)     SURGICAL HISTORY: Past Surgical History:  Procedure Laterality Date   CHOLECYSTECTOMY N/A 10/19/2021   Procedure: CHOLECYSTECTOMY;  Surgeon: Almond Lint, MD;   Location: [facility] OR;  Service: General;  Laterality: N/A;   COLON SURGERY  2000   colon cancer   COLONOSCOPY     ENDARTERECTOMY Left 04/25/2021   Procedure: LEFT ENDARTERECTOMY CAROTID;  Surgeon: Cephus Shelling, MD;  Location: [facility] OR;  Service: Vascular;  Laterality: Left;   EUS     pancreatic cyst   KNEE ARTHROSCOPY  2007   LT- [facility]   LAPAROSCOPY N/A 10/19/2021   Procedure: LAPAROSCOPY DIAGNOSTIC;  Surgeon: Almond Lint, MD;  Location: [facility] OR;  Service: General;  Laterality: N/A;  GENERAL AND TAP BLOCK   PATCH ANGIOPLASTY Left 04/25/2021   Procedure: PATCH ANGIOPLASTY XENOSURE 1CM X 6CM;  Surgeon: Cephus Shelling, MD;  Location: [facility] OR;  Service: Vascular;  Laterality: Left;   PORTACATH PLACEMENT Left 11/17/2021   Procedure: PORT PLACEMENT;  Surgeon: Almond Lint, MD;  Location: [facility] OR;  Service: General;  Laterality: Left;   PROSTATE BIOPSY  05/23/13   gleason 7, volume 11.5 ml   WHIPPLE PROCEDURE N/A 10/19/2021   Procedure: ATTEMPTED WHIPPLE PROCEDURE  WITH GASTRODUODENAL BYPASS;  Surgeon: Almond Lint, MD;  Location: [facility] OR;  Service: General;  Laterality: N/A;  GENERAL AND TAP BLOCK    I have reviewed the social history and family history with the patient and they are unchanged from previous note.  ALLERGIES:  is allergic to lyrica [pregabalin] and ciprofloxacin.  MEDICATIONS:  Current Outpatient Medications  Medication Sig Dispense Refill   amLODipine (NORVASC) 10 MG tablet Take 1 tablet by mouth once daily 90 tablet 0   aspirin EC 81 MG EC tablet Take 1 tablet (81 mg total) by mouth daily. Swallow whole. 30 tablet 11   atorvastatin (LIPITOR) 40 MG tablet Take 1 tablet by mouth once daily 90 tablet 2   b complex vitamins capsule Take 1 capsule by mouth daily.     benazepril (LOTENSIN) 40 MG tablet Take 1 tablet by mouth once daily 90 tablet 2   carvedilol (COREG) 12.5 MG tablet TAKE 1 TABLET BY MOUTH 2 TIMES DAILY. 180 tablet 3   cyanocobalamin (VITAMIN B12) 500 MCG tablet  Take 500 mcg by mouth daily.     DULoxetine (CYMBALTA) 30 MG capsule Take 1 capsule (30 mg total) by mouth daily. 55 capsule 1   iron polysaccharides (NIFEREX) 150 MG capsule TAKE 1 CAPSULE BY MOUTH TWICE A DAY 60 capsule 2   potassium chloride SA (KLOR-CON M20) 20 MEQ tablet Take 1 tablet (20 mEq total) by mouth 3 (three) times daily. 90 tablet 2   prochlorperazine (COMPAZINE) 10 MG tablet Take 1 tablet (10 mg total) by mouth every 6 (six) hours as needed for nausea or vomiting (Use for nausea and / or vomiting unresolved with ondansetron (Zofran).). 30 tablet 0   traMADol (ULTRAM) 50 MG tablet TAKE 1 TABLET BY MOUTH EVERY 12 HOURS AS NEEDED 60 tablet 2   Travoprost, BAK Free, (TRAVATAN) 0.004 % SOLN ophthalmic solution Place 1 drop into both eyes at bedtime.     VITAMIN D PO Take 2 tablets by mouth daily. Gummies     No current facility-administered medications for this visit.   Facility-Administered Medications Ordered in Other Visits  Medication Dose  Route Frequency Provider Last Rate Last Admin   sodium chloride flush (NS) 0.9 % injection 10 mL  10 mL Intracatheter PRN Malachy Mood, MD   10 mL at 12/21/22 1519    PHYSICAL EXAMINATION: ECOG PERFORMANCE STATUS:   Vitals:   12/21/22 1123  BP: (!) 150/65  Pulse: 78  Resp: 16  Temp: 97.9 F (36.6 C)  SpO2: 100%   Wt Readings from Last 3 Encounters:  12/21/22 222 lb 1.6 oz (100.7 kg)  12/01/22 211 lb 1.6 oz (95.8 kg)  11/23/22 207 lb 6.4 oz (94.1 kg)     GENERAL:alert, no distress and comfortable SKIN: skin color, texture, turgor are normal, no rashes or significant lesions EYES: normal, Conjunctiva are pink and non-injected, sclera clear NECK: supple, thyroid normal size, non-tender, without nodularity LYMPH:  no palpable lymphadenopathy in the cervical, axillary  LUNGS: clear to auscultation and percussion with normal breathing effort HEART: regular rate & rhythm and no murmurs and no lower extremity edema ABDOMEN:abdomen soft,  non-tender and normal bowel sounds Musculoskeletal:no cyanosis of digits and no clubbing  NEURO: alert & oriented x 3 with fluent speech, no focal motor/sensory deficits   LABORATORY DATA:  I have reviewed the data as listed    Latest Ref Rng & Units 12/21/2022   11:01 AM 11/23/2022   11:00 AM 11/09/2022   10:20 AM  CBC  WBC 4.0 - 10.5 K/uL 6.6  14.3  10.4   Hemoglobin 13.0 - 17.0 g/dL 91.4  78.2  95.6   Hematocrit 39.0 - 52.0 % 37.6  34.6  34.9   Platelets 150 - 400 K/uL 162  202  197         Latest Ref Rng & Units 12/21/2022   11:01 AM 11/23/2022   11:00 AM 11/09/2022   10:20 AM  CMP  Glucose 70 - 99 mg/dL 213  83  086   BUN 8 - 23 mg/dL 25  21  20    Creatinine 0.61 - 1.24 mg/dL 5.78  4.69  6.29   Sodium 135 - 145 mmol/L 143  142  143   Potassium 3.5 - 5.1 mmol/L 3.8  3.7  3.7   Chloride 98 - 111 mmol/L 111  109  110   CO2 22 - 32 mmol/L 29  30  27    Calcium 8.9 - 10.3 mg/dL 8.9  8.5  8.6   Total Protein 6.5 - 8.1 g/dL 5.9  5.7  5.9   Total Bilirubin 0.3 - 1.2 mg/dL 1.1  0.7  0.8   Alkaline Phos 38 - 126 U/L 86  124  132   AST 15 - 41 U/L 21  17  20    ALT 0 - 44 U/L 27  30  22        RADIOGRAPHIC STUDIES: I have personally reviewed the radiological images as listed and agreed with the findings in the report. No results found.    Orders Placed This Encounter  Procedures   CBC with Differential (Cancer Center Only)    Standing Status:   Future    Standing Expiration Date:   01/25/2024   CMP (Cancer Center only)    Standing Status:   Future    Standing Expiration Date:   01/25/2024   CBC with Differential (Cancer Center Only)    Standing Status:   Future    Standing Expiration Date:   02/08/2024   CMP (Cancer Center only)    Standing Status:   Future    Standing Expiration  Date:   02/08/2024   All questions were answered. The patient knows to call the clinic with any problems, questions or concerns. No barriers to learning was detected. The total time spent in the  appointment was 25 minutes.     Malachy Mood, MD 12/21/2022

## 2022-12-22 ENCOUNTER — Other Ambulatory Visit: Payer: Self-pay

## 2022-12-23 ENCOUNTER — Inpatient Hospital Stay: Payer: Medicare Other

## 2022-12-25 ENCOUNTER — Other Ambulatory Visit: Payer: Self-pay

## 2022-12-31 ENCOUNTER — Other Ambulatory Visit: Payer: Self-pay

## 2023-01-02 ENCOUNTER — Other Ambulatory Visit: Payer: Self-pay

## 2023-01-10 NOTE — Assessment & Plan Note (Addendum)
diagnosed 02/2021 by endo-/colonoscopy for weight loss and anemia. Treatment delayed due to his stroke on 04/18/21. S/p 8 cycles neoadjuvant Keytruda 05/11/21 - 09/28/21. He tolerated well with mild fatigue.  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 -He began FOLFOX on 11/24/21. He has tolerated well overall with some taste changes and mild fatigue -restaging CT scan 02/20/2022 showed PR -restaging CT from 05/15/2022 showed improved masslike enlargement of the duodenal as well as fold nodularity and thickening along the stomach, however it showed a new liver lesion, indeterminate, will obtain liver MRI  -Patient complained of worsening fatigue, last cycle chemo was held on 05/18/22. -Liver MRI in March 2024 showed benign liver lesions and a cystic lesion in pancreas, no evidence of liver metastasis.   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  No evidence of distant metastasis. -Due to the cancer progression, and his persistent peripheral neuropathy, I recommend change treatment to second line FOLFIRI, he started on 09/07/22, he is tolerating well so far  -Due to his moderate fatigue, I reduced irinotecan dose slightly from C3 -His performance status has been low due to significant fatigue, required chemo dose reduction and chemo break  --Restaging CT scan from December 04, 2022 showed excellent response, the primary duodenal mass is not really measurable, no other signs of metastasis. -I changed his treatment to maintenance irrinotecan alone on  12/21/2022.

## 2023-01-11 ENCOUNTER — Other Ambulatory Visit: Payer: Self-pay

## 2023-01-11 ENCOUNTER — Inpatient Hospital Stay: Payer: Medicare Other

## 2023-01-11 ENCOUNTER — Inpatient Hospital Stay: Payer: Medicare Other | Admitting: Hematology

## 2023-01-11 VITALS — BP 168/80 | HR 75 | Temp 98.3°F | Resp 15 | Ht 66.0 in | Wt 220.8 lb

## 2023-01-11 VITALS — BP 161/66 | HR 67

## 2023-01-11 DIAGNOSIS — C170 Malignant neoplasm of duodenum: Secondary | ICD-10-CM | POA: Diagnosis not present

## 2023-01-11 DIAGNOSIS — Z5111 Encounter for antineoplastic chemotherapy: Secondary | ICD-10-CM | POA: Diagnosis not present

## 2023-01-11 DIAGNOSIS — Z95828 Presence of other vascular implants and grafts: Secondary | ICD-10-CM

## 2023-01-11 DIAGNOSIS — D500 Iron deficiency anemia secondary to blood loss (chronic): Secondary | ICD-10-CM

## 2023-01-11 LAB — CMP (CANCER CENTER ONLY)
ALT: 15 U/L (ref 0–44)
AST: 18 U/L (ref 15–41)
Albumin: 3.5 g/dL (ref 3.5–5.0)
Alkaline Phosphatase: 120 U/L (ref 38–126)
Anion gap: 4 — ABNORMAL LOW (ref 5–15)
BUN: 17 mg/dL (ref 8–23)
CO2: 30 mmol/L (ref 22–32)
Calcium: 9.1 mg/dL (ref 8.9–10.3)
Chloride: 111 mmol/L (ref 98–111)
Creatinine: 0.98 mg/dL (ref 0.61–1.24)
GFR, Estimated: >60 mL/min (ref >60)
Glucose, Bld: 98 mg/dL (ref 70–99)
Potassium: 3.5 mmol/L (ref 3.5–5.1)
Sodium: 145 mmol/L (ref 135–145)
Total Bilirubin: 0.8 mg/dL (ref 0.3–1.2)
Total Protein: 6.5 g/dL (ref 6.5–8.1)

## 2023-01-11 LAB — CBC WITH DIFFERENTIAL (CANCER CENTER ONLY)
Abs Immature Granulocytes: 0.02 10*3/uL (ref 0.00–0.07)
Basophils Absolute: 0 10*3/uL (ref 0.0–0.1)
Basophils Relative: 1 %
Eosinophils Absolute: 0.1 10*3/uL (ref 0.0–0.5)
Eosinophils Relative: 2 %
HCT: 40.7 % (ref 39.0–52.0)
Hemoglobin: 13 g/dL (ref 13.0–17.0)
Immature Granulocytes: 1 %
Lymphocytes Relative: 19 %
Lymphs Abs: 0.8 10*3/uL (ref 0.7–4.0)
MCH: 28.3 pg (ref 26.0–34.0)
MCHC: 31.9 g/dL (ref 30.0–36.0)
MCV: 88.5 fL (ref 80.0–100.0)
Monocytes Absolute: 0.5 10*3/uL (ref 0.1–1.0)
Monocytes Relative: 12 %
Neutro Abs: 2.9 10*3/uL (ref 1.7–7.7)
Neutrophils Relative %: 65 %
Platelet Count: 162 10*3/uL (ref 150–400)
RBC: 4.6 MIL/uL (ref 4.22–5.81)
RDW: 17.2 % — ABNORMAL HIGH (ref 11.5–15.5)
WBC Count: 4.4 10*3/uL (ref 4.0–10.5)
nRBC: 0 % (ref 0.0–0.2)

## 2023-01-11 MED ORDER — PALONOSETRON HCL INJECTION 0.25 MG/5ML
0.2500 mg | Freq: Once | INTRAVENOUS | Status: AC
  Administered 2023-01-11: 0.25 mg via INTRAVENOUS
  Filled 2023-01-11: qty 5

## 2023-01-11 MED ORDER — HEPARIN SOD (PORK) LOCK FLUSH 100 UNIT/ML IV SOLN
500.0000 [IU] | Freq: Once | INTRAVENOUS | Status: AC | PRN
  Administered 2023-01-11: 500 [IU]

## 2023-01-11 MED ORDER — SODIUM CHLORIDE 0.9% FLUSH
10.0000 mL | Freq: Once | INTRAVENOUS | Status: AC
  Administered 2023-01-11: 10 mL

## 2023-01-11 MED ORDER — DEXAMETHASONE SODIUM PHOSPHATE 10 MG/ML IJ SOLN
10.0000 mg | Freq: Once | INTRAMUSCULAR | Status: AC
  Administered 2023-01-11: 10 mg via INTRAVENOUS
  Filled 2023-01-11: qty 1

## 2023-01-11 MED ORDER — SODIUM CHLORIDE 0.9 % IV SOLN
120.0000 mg/m2 | Freq: Once | INTRAVENOUS | Status: AC
  Administered 2023-01-11: 260 mg via INTRAVENOUS
  Filled 2023-01-11: qty 13

## 2023-01-11 MED ORDER — SODIUM CHLORIDE 0.9% FLUSH
10.0000 mL | INTRAVENOUS | Status: DC | PRN
  Administered 2023-01-11: 10 mL

## 2023-01-11 MED ORDER — SODIUM CHLORIDE 0.9 % IV SOLN: Freq: Once | INTRAVENOUS | Status: AC

## 2023-01-11 MED ORDER — ATROPINE SULFATE 1 MG/ML IV SOLN
0.5000 mg | Freq: Once | INTRAVENOUS | Status: AC | PRN
  Administered 2023-01-11: 0.5 mg via INTRAVENOUS
  Filled 2023-01-11: qty 1

## 2023-01-11 NOTE — Patient Instructions (Signed)
[facility]  Discharge Instructions: Thank you for choosing [facility] to provide your oncology and hematology care.   If you have a lab appointment with the [facility], please go directly to the [facility] and check in at the registration area.   Wear comfortable clothing and clothing appropriate for easy access to any Portacath or PICC line.   We strive to give you quality time with your provider. You may need to reschedule your appointment if you arrive late (15 or more minutes).  Arriving late affects you and other patients whose appointments are after yours.  Also, if you miss three or more appointments without notifying the office, you may be dismissed from the clinic at the provider's discretion.      For prescription refill requests, have your pharmacy contact our office and allow 72 hours for refills to be completed.    Today you received the following chemotherapy and/or immunotherapy agents Irinotecan     To help prevent nausea and vomiting after your treatment, we encourage you to take your nausea medication as directed.  BELOW ARE SYMPTOMS THAT SHOULD BE REPORTED IMMEDIATELY: *FEVER GREATER THAN 100.4 F (38 C) OR HIGHER *CHILLS OR SWEATING *NAUSEA AND VOMITING THAT IS NOT CONTROLLED WITH YOUR NAUSEA MEDICATION *UNUSUAL SHORTNESS OF BREATH *UNUSUAL BRUISING OR BLEEDING *URINARY PROBLEMS (pain or burning when urinating, or frequent urination) *BOWEL PROBLEMS (unusual diarrhea, constipation, pain near the anus) TENDERNESS IN MOUTH AND THROAT WITH OR WITHOUT PRESENCE OF ULCERS (sore throat, sores in mouth, or a toothache) UNUSUAL RASH, SWELLING OR PAIN  UNUSUAL VAGINAL DISCHARGE OR ITCHING   Items with * indicate a potential emergency and should be followed up as soon as possible or go to the Emergency Department if any problems should occur.  Please show the CHEMOTHERAPY ALERT CARD or IMMUNOTHERAPY ALERT CARD at  check-in to the Emergency Department and triage nurse.  Should you have questions after your visit or need to cancel or reschedule your appointment, please contact [facility]  Dept: [phone]  and follow the prompts.  Office hours are 8:00 a.m. to 4:30 p.m. Monday - Friday. Please note that voicemails left after 4:00 p.m. may not be returned until the following business day.  We are closed weekends and major holidays. You have access to a nurse at all times for urgent questions. Please call the main number to the clinic Dept: [phone] and follow the prompts.   For any non-urgent questions, you may also contact your provider using MyChart. We now offer e-Visits for anyone 9 and older to request care online for non-urgent symptoms. For details visit [email or web address].   Also download the MyChart app! Go to the app store, search "MyChart", open the app, select [facility], and log in with your MyChart username and password.

## 2023-01-11 NOTE — Progress Notes (Signed)
[facility]   [phone] [phone]   Clinic Follow up Note   Patient Care Team: Corwin Levins, MD as PCP - General Jens Som, Madolyn Frieze, MD as PCP - Cardiology (Cardiology) Iva Boop, MD as Consulting Physician (Gastroenterology) Gean Birchwood, MD as Consulting Physician (Orthopedic Surgery) Sallye Lat, MD as Consulting Physician (Ophthalmology) Malachy Mood, MD as Consulting Physician (Oncology)  Date of Service:  01/11/2023  CHIEF COMPLAINT: f/u of duodenal carcinoma  CURRENT THERAPY:  Irinotecan every 2 weeks  Oncology History   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 Treatment delayed due to his stroke on 04/18/21. S/p 8 cycles neoadjuvant Keytruda 05/11/21 - 09/28/21. He tolerated well with mild fatigue.  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 -He began FOLFOX on 11/24/21. He has tolerated well overall with some taste changes and mild fatigue -restaging CT scan 02/20/2022 showed PR -restaging CT from 05/15/2022 showed improved masslike enlargement of the duodenal as well as fold nodularity and thickening along the stomach, however it showed a new liver lesion, indeterminate, will obtain liver MRI  -Patient complained of worsening fatigue, last cycle chemo was held on 05/18/22. -Liver MRI in March 2024 showed benign liver lesions and a cystic lesion in pancreas, no evidence of liver metastasis.   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  No evidence of distant metastasis. -Due to the cancer progression, and his persistent peripheral neuropathy, I recommend  change treatment to second line FOLFIRI, he started on 09/07/22, he is tolerating well so far  -Due to his moderate fatigue, I reduced irinotecan dose slightly from C3 -His performance status has been low due to significant fatigue, required chemo dose reduction and chemo break  --Restaging CT scan from December 04, 2022 showed excellent response, the primary duodenal mass is not really measurable, no other signs of metastasis. -I changed his treatment to maintenance irrinotecan alone on  12/21/2022.     Assessment and Plan     Duodenal adenocarcinoma Stable disease on chemo. Last scan showed good response. No new symptoms reported. -Continue irinotecan every 2 weeks -Plan for repeat scan at the end of the year.  Abdominal Hernia Small hernia noted at the site of previous incision. No associated pain or complications. -No intervention needed at this time.  Knee Pain Managed with Tramadol as prescribed by Dr. Jonny Ruiz. -Continue Tramadol as needed for knee pain.  Hypertension and Hyperlipidemia On medication, no issues reported. -Continue current blood pressure and cholesterol medications.  General Health Maintenance -Continue Potassium supplement. -Advised to get COVID-19 vaccine at pharmacy. -Next appointment scheduled for January 25, 2023.     Plan -Labs reviewed, adequate for treatment, will proceed irinotecan today and continue every 2 weeks -Follow-up in 2 weeks    SUMMARY OF ONCOLOGIC HISTORY: Oncology History Overview Note   Cancer Staging  Adenocarcinoma of duodenum (HCC) Staging form: Small Intestine - Adenocarcinoma, AJCC 8th Edition - Clinical stage from 03/18/2021: Stage IIIA (cTX, cN1, cM0) - Signed by Malachy Mood, MD on 06/22/2021 Stage prefix: Initial diagnosis     Adenocarcinoma of duodenum (HCC)  03/01/2021 Imaging   EXAM: ABDOMEN ULTRASOUND COMPLETE  IMPRESSION: 1. Simple appearing right renal cyst. 2.  Otherwise negative abdominal ultrasound    03/18/2021 Procedure   Upper Endoscopy/Colonoscopy, Dr. Myrtie Neither  Upper Endoscopy Impression: - Normal esophagus. - A single submucosal papule (nodule) found in the stomach. - Duodenal mass. Biopsied. Concerning for malignancy.  Colonoscopy Impression: - Diverticulosis in the entire examined colon. - The examined portion of the ileum was normal. - Patent end-to-end colo-colonic anastomosis, characterized by healthy appearing mucosa. - Internal hemorrhoids. - No specimens collected.   03/18/2021 Initial Biopsy   Diagnosis Duodenum, Biopsy - INVASIVE MODERATE TO POORLY DIFFERENTIATED ADENOCARCINOMA   03/18/2021 Cancer Staging   Staging form: Small Intestine - Adenocarcinoma, AJCC 8th Edition - Clinical stage from 03/18/2021: Stage IIIA (cTX, cN1, cM0) - Signed by Malachy Mood, MD on 06/22/2021 Stage prefix: Initial diagnosis   03/22/2021 Initial Diagnosis   Adenocarcinoma of duodenum (HCC)   04/01/2021 Imaging   EXAM: CT CHEST, ABDOMEN, AND PELVIS WITH CONTRAST  IMPRESSION: 1. Circumferential wall thickening of the first portion of the duodenum, in keeping with known primary malignancy. 2. Enlarged, hypodense lymph nodes anterior to the pancreatic head and in the portacaval station, concerning for nodal metastatic disease. 3. No other evidence of metastatic disease in the chest, abdomen, or pelvis. 4. Incidental note of a fluid attenuation lesion within the proximal pancreatic body measuring 1.9 x 1.4 cm, with prominence of the pancreatic duct distally, up to 0.6 cm. This lesion was present on remote prior examination dated 05/04/2009, and has slightly increased in size over a long period of time. This is consistent with a small IPMN and benign given indolent behavior over greater than 10 years. 5. Background of very fine centrilobular pulmonary nodules, most concentrated in the lung apices, most commonly seen in smoking-related respiratory bronchiolitis. 6. Coronary artery  disease.   Aortic Atherosclerosis (ICD10-I70.0).    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 Of note, a variant of uncertain significance was detected in the ATM (p.A869G), BLM (c.800-3T>G), and SDHA (p.V632F) genes. Report date is 04/27/2021.  The CancerNext-Expanded gene panel offered by [facility] and includes sequencing, rearrangement, and RNA analysis for the following 77 genes: AIP, ALK, APC, ATM, AXIN2, BAP1, BARD1, BLM, BMPR1A, BRCA1, BRCA2, BRIP1, CDC73, CDH1, CDK4, CDKN1B, CDKN2A, CHEK2, CTNNA1, DICER1, FANCC, FH, FLCN, GALNT12, KIF1B, LZTR1, MAX, MEN1, MET, MLH1, MSH2, MSH3, MSH6, MUTYH, NBN, NF1, NF2, NTHL1, PALB2, PHOX2B, PMS2, POT1, PRKAR1A, PTCH1, PTEN, RAD51C, RAD51D, RB1, RECQL, RET, SDHA, SDHAF2, SDHB, SDHC, SDHD, SMAD4, SMARCA4, SMARCB1, SMARCE1, STK11, SUFU, TMEM127, TP53, TSC1, TSC2, VHL and XRCC2 (sequencing and deletion/duplication); EGFR, EGLN1, HOXB13, KIT, MITF, PDGFRA, POLD1, and POLE (sequencing only); EPCAM and GREM1 (deletion/duplication only).    05/11/2021 - 09/28/2021 Chemotherapy   Patient is on Treatment Plan : COLORECTAL Pembrolizumab (200) q21d     11/24/2021 - 05/03/2022 Chemotherapy   Patient is on Treatment Plan : COLORECTAL FOLFOX q14d x 4 months     02/20/2022 Imaging    IMPRESSION: 1. Distal stomach/duodenal mass is decreased in size when compared with prior exam. 2. Stable upper abdominal adenopathy. 3. 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 4. Aortic Atherosclerosis (ICD10-I70.0).   05/15/2022 Imaging    IMPRESSION: Improved masslike enlargement of the duodenal as well as fold nodularity and thickening along the stomach. In addition there are some small nodes identified in the upper abdomen which are similar  overall  compared to the study of December 2023   However there is a new spiculated nodule in the middle lobe which is worrisome. In addition there are some small low-attenuation liver lesions which are too small to completely characterize but were not clearly seen on the previous examination. Recommend further evaluation. For the liver lesions in particular a Eovist liver MRI may be of some benefit as clinically directed.   Persistent cystic lesion along the midbody of the pancreas with associated duct dilatation. A cystic neoplasm of the pancreas is possible.   06/06/2022 Imaging   MR Abdomen W Wo Contrast     IMPRESSION: 1. No definite evidence of hepatic metastatic disease. Scattered punctate foci of T2 hyperintensity throughout the liver are not associated with any suspicious enhancement or restricted diffusion and are favored to reflect small cysts or biliary hamartomas. Some of these are suboptimally characterized based on their small size, and given nonvisualization on older prior studies, attention on follow-up CT in 3-6 months recommended. 2. Unchanged residual irregular thickening of the walls of the distal stomach and proximal duodenum. 3. 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 This lesion is unchanged in size from recent CT, although has slowly enlarged from older prior examinations. Recommend follow-up abdominal MRI in 1 year. 4. Stable mild extrahepatic biliary dilatation status post cholecystectomy. 5. Aortic and branch vessel atherosclerosis, better seen on CT.   09/07/2022 -  Chemotherapy   Patient is on Treatment Plan : COLORECTAL FOLFIRI q14d        Discussed the use of AI scribe software for clinical note transcription with the patient, who gave verbal consent to proceed.  History of Present Illness   The patient, a 77 year old with a history of adrenal carcinoma, presents for a  routine follow-up. He reports a decrease in energy level today, but denies any specific precipitating factors. He recently returned from a fishing trip, which he enjoyed and did not report any significant exertion or new symptoms during the trip.  He denies any issues with eating or feelings of bloating or nausea post meals.  The patient also reports knee pain, for which he is taking Tramadol prescribed by another physician. He continues to take Potassium and medications for blood pressure and cholesterol. He has nausea medication available as needed.         All other systems were reviewed with the patient and are negative.  MEDICAL HISTORY:  Past Medical History:  Diagnosis Date   Anemia 10/31/2021   Dx IDA   Carotid stenosis, left    Colon cancer (HCC) 2000   Coronary artery disease    per Dr. 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, volume 11.5 ml   Stroke (HCC) 04/18/2021   TIA (transient ischemic attack)     SURGICAL HISTORY: Past Surgical History:  Procedure Laterality Date   CHOLECYSTECTOMY N/A 10/19/2021   Procedure: CHOLECYSTECTOMY;  Surgeon: Almond Lint, MD;  Location: [facility] OR;  Service: General;  Laterality: N/A;   COLON SURGERY  2000   colon cancer   COLONOSCOPY     ENDARTERECTOMY Left 04/25/2021   Procedure: LEFT ENDARTERECTOMY CAROTID;  Surgeon: Cephus Shelling, MD;  Location: [facility] OR;  Service: Vascular;  Laterality: Left;   EUS     pancreatic cyst   KNEE ARTHROSCOPY  2007   LT- [facility]   LAPAROSCOPY N/A 10/19/2021   Procedure: LAPAROSCOPY DIAGNOSTIC;  Surgeon: Almond Lint, MD;   Location: [facility] OR;  Service: General;  Laterality: N/A;  GENERAL AND TAP BLOCK   PATCH ANGIOPLASTY Left 04/25/2021   Procedure: PATCH ANGIOPLASTY XENOSURE 1CM X 6CM;  Surgeon: Cephus Shelling, MD;  Location: [facility] OR;  Service: Vascular;  Laterality: Left;   PORTACATH PLACEMENT Left 11/17/2021   Procedure: PORT PLACEMENT;  Surgeon: Almond Lint, MD;  Location: [facility] OR;  Service: General;  Laterality: Left;   PROSTATE BIOPSY  05/23/13   gleason 7, volume 11.5 ml   WHIPPLE PROCEDURE N/A 10/19/2021   Procedure: ATTEMPTED WHIPPLE PROCEDURE  WITH GASTRODUODENAL BYPASS;  Surgeon: Almond Lint, MD;  Location: [facility] OR;  Service: General;  Laterality: N/A;  GENERAL AND TAP BLOCK    I have reviewed the social history and family history with the patient and they are unchanged from previous note.  ALLERGIES:  is allergic to lyrica [pregabalin] and ciprofloxacin.  MEDICATIONS:  Current Outpatient Medications  Medication Sig Dispense Refill   amLODipine (NORVASC) 10 MG tablet Take 1 tablet by mouth once daily 90 tablet 0   aspirin EC 81 MG EC tablet Take 1 tablet (81 mg total) by mouth daily. Swallow whole. 30 tablet 11   atorvastatin (LIPITOR) 40 MG tablet Take 1 tablet by mouth once daily 90 tablet 2   b complex vitamins capsule Take 1 capsule by mouth daily.     benazepril (LOTENSIN) 40 MG tablet Take 1 tablet by mouth once daily 90 tablet 2   carvedilol (COREG) 12.5 MG tablet TAKE 1 TABLET BY MOUTH 2 TIMES DAILY. 180 tablet 3   cyanocobalamin (VITAMIN B12) 500 MCG tablet Take 500 mcg by mouth daily.     DULoxetine (CYMBALTA) 30 MG capsule Take 1 capsule (30 mg total) by mouth daily. 55 capsule 1   iron polysaccharides (NIFEREX) 150 MG capsule TAKE 1 CAPSULE BY MOUTH TWICE A DAY 60 capsule 2   potassium chloride SA (KLOR-CON M20) 20 MEQ tablet Take 1 tablet (20 mEq total) by mouth 3 (three) times daily. 90 tablet 2   prochlorperazine (COMPAZINE) 10 MG tablet Take 1 tablet (10 mg total) by mouth every 6 (six)  hours as needed for nausea or vomiting (Use for nausea and / or vomiting unresolved with ondansetron (Zofran).). 30 tablet 0   traMADol (ULTRAM) 50 MG tablet TAKE 1 TABLET BY MOUTH EVERY 12 HOURS AS NEEDED 60 tablet 2   Travoprost, BAK Free, (TRAVATAN) 0.004 % SOLN ophthalmic solution Place 1 drop into both eyes at bedtime.     VITAMIN D PO Take 2 tablets by mouth daily. Gummies     No current facility-administered medications for this visit.   Facility-Administered Medications Ordered in Other Visits  Medication Dose Route Frequency Provider Last Rate Last Admin   atropine injection 0.5 mg  0.5 mg Intravenous Once PRN Malachy Mood, MD       heparin lock flush 100 unit/mL  500 Units Intracatheter Once PRN Malachy Mood, MD       irinotecan (CAMPTOSAR) 260 mg in sodium chloride 0.9 % 500 mL chemo infusion  120 mg/m2 (  Treatment Plan Recorded) Intravenous Once Malachy Mood, MD       sodium chloride flush (NS) 0.9 % injection 10 mL  10 mL Intracatheter PRN Malachy Mood, MD        PHYSICAL EXAMINATION: ECOG PERFORMANCE STATUS: 2 - Symptomatic, <50% confined to bed  Vitals:   01/11/23 0950  BP: (!) 168/80  Pulse: 75  Resp: 15  Temp: 98.3 F (36.8 C)  SpO2: 100%   Wt Readings from Last 3 Encounters:  01/11/23 220 lb 12.8 oz (100.2 kg)  12/21/22 222 lb 1.6 oz (100.7 kg)  12/01/22 211 lb 1.6 oz (95.8 kg)     GENERAL:alert, no distress and comfortable SKIN: skin color, texture, turgor are normal, no rashes or significant lesions EYES: normal, Conjunctiva are pink and non-injected, sclera clear NECK: supple, thyroid normal size, non-tender, without nodularity LYMPH:  no palpable lymphadenopathy in the cervical, axillary  LUNGS: clear to auscultation and percussion with normal breathing effort HEART: regular rate & rhythm and no murmurs and no lower extremity edema ABDOMEN:abdomen soft, non-tender and normal bowel sounds. Small hernia noted at previous surgical incision site, indicative of slow  healing. No pain associated with hernia.  Musculoskeletal:no cyanosis of digits and no clubbing  NEURO: alert & oriented x 3 with fluent speech, no focal motor/sensory deficits   LABORATORY DATA:  I have reviewed the data as listed    Latest Ref Rng & Units 01/11/2023    9:15 AM 12/21/2022   11:01 AM 11/23/2022   11:00 AM  CBC  WBC 4.0 - 10.5 K/uL 4.4  6.6  14.3   Hemoglobin 13.0 - 17.0 g/dL 16.1  09.6  04.5   Hematocrit 39.0 - 52.0 % 40.7  37.6  34.6   Platelets 150 - 400 K/uL 162  162  202         Latest Ref Rng & Units 01/11/2023    9:15 AM 12/21/2022   11:01 AM 11/23/2022   11:00 AM  CMP  Glucose 70 - 99 mg/dL 98  409  83   BUN 8 - 23 mg/dL 17  25  21    Creatinine 0.61 - 1.24 mg/dL 8.11  9.14  7.82   Sodium 135 - 145 mmol/L 145  143  142   Potassium 3.5 - 5.1 mmol/L 3.5  3.8  3.7   Chloride 98 - 111 mmol/L 111  111  109   CO2 22 - 32 mmol/L 30  29  30    Calcium 8.9 - 10.3 mg/dL 9.1  8.9  8.5   Total Protein 6.5 - 8.1 g/dL 6.5  5.9  5.7   Total Bilirubin 0.3 - 1.2 mg/dL 0.8  1.1  0.7   Alkaline Phos 38 - 126 U/L 120  86  124   AST 15 - 41 U/L 18  21  17    ALT 0 - 44 U/L 15  27  30        RADIOGRAPHIC STUDIES: I have personally reviewed the radiological images as listed and agreed with the findings in the report. No results found.    No orders of the defined types were placed in this encounter.  All questions were answered. The patient knows to call the clinic with any problems, questions or concerns. No barriers to learning was detected. The total time spent in the appointment was 25 minutes.     Malachy Mood, MD 01/11/2023

## 2023-01-13 ENCOUNTER — Other Ambulatory Visit: Payer: Self-pay

## 2023-01-24 NOTE — Assessment & Plan Note (Signed)
diagnosed 02/2021 by endo-/colonoscopy for weight loss and anemia. Treatment delayed due to his stroke on 04/18/21. S/p 8 cycles neoadjuvant Keytruda 05/11/21 - 09/28/21. He tolerated well with mild fatigue.  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 -He began FOLFOX on 11/24/21. He has tolerated well overall with some taste changes and mild fatigue -restaging CT scan 02/20/2022 showed PR -restaging CT from 05/15/2022 showed improved masslike enlargement of the duodenal as well as fold nodularity and thickening along the stomach, however it showed a new liver lesion, indeterminate, will obtain liver MRI  -Patient complained of worsening fatigue, last cycle chemo was held on 05/18/22. -Liver MRI in March 2024 showed benign liver lesions and a cystic lesion in pancreas, no evidence of liver metastasis.   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  No evidence of distant metastasis. -Due to the cancer progression, and his persistent peripheral neuropathy, I recommend change treatment to second line FOLFIRI, he started on 09/07/22, he is tolerating well so far  -Due to his moderate fatigue, I reduced irinotecan dose slightly from C3 -His performance status has been low due to significant fatigue, required chemo dose reduction and chemo break  --Restaging CT scan from December 04, 2022 showed excellent response, the primary duodenal mass is not really measurable, no other signs of metastasis. -I changed his treatment to maintenance irrinotecan alone on  12/21/2022.

## 2023-01-25 ENCOUNTER — Inpatient Hospital Stay: Payer: Medicare Other

## 2023-01-25 ENCOUNTER — Encounter: Payer: Self-pay | Admitting: Internal Medicine

## 2023-01-25 ENCOUNTER — Inpatient Hospital Stay: Payer: Medicare Other | Attending: Nurse Practitioner

## 2023-01-25 ENCOUNTER — Encounter: Payer: Self-pay | Admitting: Nurse Practitioner

## 2023-01-25 ENCOUNTER — Encounter: Payer: Self-pay | Admitting: Hematology

## 2023-01-25 ENCOUNTER — Inpatient Hospital Stay: Payer: Medicare Other | Admitting: Hematology

## 2023-01-25 VITALS — BP 158/71 | HR 78 | Temp 97.6°F | Resp 19 | Wt 222.1 lb

## 2023-01-25 VITALS — BP 154/68 | HR 71 | Resp 18

## 2023-01-25 DIAGNOSIS — C170 Malignant neoplasm of duodenum: Secondary | ICD-10-CM

## 2023-01-25 DIAGNOSIS — Z79899 Other long term (current) drug therapy: Secondary | ICD-10-CM | POA: Diagnosis not present

## 2023-01-25 DIAGNOSIS — Z8546 Personal history of malignant neoplasm of prostate: Secondary | ICD-10-CM | POA: Diagnosis not present

## 2023-01-25 DIAGNOSIS — D508 Other iron deficiency anemias: Secondary | ICD-10-CM | POA: Diagnosis present

## 2023-01-25 DIAGNOSIS — Z5111 Encounter for antineoplastic chemotherapy: Secondary | ICD-10-CM | POA: Diagnosis present

## 2023-01-25 DIAGNOSIS — I10 Essential (primary) hypertension: Secondary | ICD-10-CM | POA: Diagnosis not present

## 2023-01-25 DIAGNOSIS — Z7982 Long term (current) use of aspirin: Secondary | ICD-10-CM | POA: Diagnosis not present

## 2023-01-25 LAB — CMP (CANCER CENTER ONLY)
ALT: 15 U/L (ref 0–44)
AST: 16 U/L (ref 15–41)
Albumin: 3.4 g/dL — ABNORMAL LOW (ref 3.5–5.0)
Alkaline Phosphatase: 106 U/L (ref 38–126)
Anion gap: 5 (ref 5–15)
BUN: 17 mg/dL (ref 8–23)
CO2: 29 mmol/L (ref 22–32)
Calcium: 8.9 mg/dL (ref 8.9–10.3)
Chloride: 109 mmol/L (ref 98–111)
Creatinine: 1.09 mg/dL (ref 0.61–1.24)
GFR, Estimated: >60 mL/min (ref >60)
Glucose, Bld: 144 mg/dL — ABNORMAL HIGH (ref 70–99)
Potassium: 3.8 mmol/L (ref 3.5–5.1)
Sodium: 143 mmol/L (ref 135–145)
Total Bilirubin: 0.9 mg/dL (ref <1.2)
Total Protein: 6.4 g/dL — ABNORMAL LOW (ref 6.5–8.1)

## 2023-01-25 LAB — CBC WITH DIFFERENTIAL (CANCER CENTER ONLY)
Abs Immature Granulocytes: 0.01 10*3/uL (ref 0.00–0.07)
Basophils Absolute: 0 10*3/uL (ref 0.0–0.1)
Basophils Relative: 1 %
Eosinophils Absolute: 0.2 10*3/uL (ref 0.0–0.5)
Eosinophils Relative: 5 %
HCT: 39.9 % (ref 39.0–52.0)
Hemoglobin: 12.9 g/dL — ABNORMAL LOW (ref 13.0–17.0)
Immature Granulocytes: 0 %
Lymphocytes Relative: 30 %
Lymphs Abs: 0.8 10*3/uL (ref 0.7–4.0)
MCH: 27.9 pg (ref 26.0–34.0)
MCHC: 32.3 g/dL (ref 30.0–36.0)
MCV: 86.2 fL (ref 80.0–100.0)
Monocytes Absolute: 0.3 10*3/uL (ref 0.1–1.0)
Monocytes Relative: 12 %
Neutro Abs: 1.5 10*3/uL — ABNORMAL LOW (ref 1.7–7.7)
Neutrophils Relative %: 52 %
Platelet Count: 193 10*3/uL (ref 150–400)
RBC: 4.63 MIL/uL (ref 4.22–5.81)
RDW: 16 % — ABNORMAL HIGH (ref 11.5–15.5)
WBC Count: 2.8 10*3/uL — ABNORMAL LOW (ref 4.0–10.5)
nRBC: 0 % (ref 0.0–0.2)

## 2023-01-25 LAB — CEA (IN HOUSE-CHCC): CEA (CHCC-In House): 7.04 ng/mL — ABNORMAL HIGH (ref 0.00–5.00)

## 2023-01-25 MED ORDER — HEPARIN SOD (PORK) LOCK FLUSH 100 UNIT/ML IV SOLN
500.0000 [IU] | Freq: Once | INTRAVENOUS | Status: AC | PRN
Start: 2023-01-25 — End: 2023-01-25
  Administered 2023-01-25: 500 [IU]

## 2023-01-25 MED ORDER — PALONOSETRON HCL INJECTION 0.25 MG/5ML
0.2500 mg | Freq: Once | INTRAVENOUS | Status: AC
Start: 2023-01-25 — End: 2023-01-25
  Administered 2023-01-25: 0.25 mg via INTRAVENOUS
  Filled 2023-01-25: qty 5

## 2023-01-25 MED ORDER — DEXAMETHASONE SODIUM PHOSPHATE 10 MG/ML IJ SOLN
10.0000 mg | Freq: Once | INTRAMUSCULAR | Status: AC
Start: 2023-01-25 — End: 2023-01-25
  Administered 2023-01-25: 10 mg via INTRAVENOUS
  Filled 2023-01-25: qty 1

## 2023-01-25 MED ORDER — SODIUM CHLORIDE 0.9% FLUSH
10.0000 mL | INTRAVENOUS | Status: DC | PRN
  Administered 2023-01-25: 10 mL

## 2023-01-25 MED ORDER — IRINOTECAN HCL CHEMO INJECTION 100 MG/5ML
120.0000 mg/m2 | Freq: Once | INTRAVENOUS | Status: AC
  Administered 2023-01-25: 260 mg via INTRAVENOUS
  Filled 2023-01-25: qty 13

## 2023-01-25 MED ORDER — SODIUM CHLORIDE 0.9 % IV SOLN: Freq: Once | INTRAVENOUS | Status: AC

## 2023-01-25 MED ORDER — ATROPINE SULFATE 1 MG/ML IV SOLN
0.5000 mg | Freq: Once | INTRAVENOUS | Status: AC | PRN
  Administered 2023-01-25: 0.5 mg via INTRAVENOUS
  Filled 2023-01-25: qty 1

## 2023-01-25 NOTE — Progress Notes (Signed)
[facility]   [phone] [phone]   Clinic Follow up Note   Patient Care Team: Corwin Levins, MD as PCP - General Jens Som, Madolyn Frieze, MD as PCP - Cardiology (Cardiology) Iva Boop, MD as Consulting Physician (Gastroenterology) Gean Birchwood, MD as Consulting Physician (Orthopedic Surgery) Sallye Lat, MD as Consulting Physician (Ophthalmology) Malachy Mood, MD as Consulting Physician (Oncology)  Date of Service:  01/25/2023  CHIEF COMPLAINT: f/u of duodenal adenocarcinoma  CURRENT THERAPY:  Maintenance irinotecan every 2 weeks  Oncology History   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 Treatment delayed due to his stroke on 04/18/21. S/p 8 cycles neoadjuvant Keytruda 05/11/21 - 09/28/21. He tolerated well with mild fatigue.  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 -He began FOLFOX on 11/24/21. He has tolerated well overall with some taste changes and mild fatigue -restaging CT scan 02/20/2022 showed PR -restaging CT from 05/15/2022 showed improved masslike enlargement of the duodenal as well as fold nodularity and thickening along the stomach, however it showed a new liver lesion, indeterminate, will obtain liver MRI  -Patient complained of worsening fatigue, last cycle chemo was held on 05/18/22. -Liver MRI in March 2024 showed benign liver lesions and a cystic lesion in pancreas, no evidence of liver metastasis.   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  No evidence of distant metastasis. -Due to the cancer progression, and his persistent peripheral neuropathy,  I recommend change treatment to second line FOLFIRI, he started on 09/07/22, he is tolerating well so far  -Due to his moderate fatigue, I reduced irinotecan dose slightly from C3 -His performance status has been low due to significant fatigue, required chemo dose reduction and chemo break  --Restaging CT scan from December 04, 2022 showed excellent response, the primary duodenal mass is not really measurable, no other signs of metastasis. -I changed his treatment to maintenance irrinotecan alone on  12/21/2022.      Assessment and Plan    Duodenal adenocarcinoma Patient is currently on irinotecan chemotherapy with a good response based on last scans. Reported transient abdominal pain and headache, which resolved spontaneously. No nausea or vomiting. Bowel movements normal. -Continue irinotecan chemotherapy as tolerated. -Next infusions scheduled for November 21 and February 22, 2023. -Monitor for recurrence of abdominal pain and headache.  Arthritis Complaints of knee pain. -No change in management discussed.  Hypertension Patient noted high blood pressure readings during clinic visits. -Advised patient to monitor blood pressure at home.  General Health Maintenance -Confirmed receipt of flu vaccine. -Continue with scheduled infusions. -Plan to postpone first chemotherapy session in January 2025 to allow for a three-week break over the Christmas holiday. -Next follow-up visit in two weeks.      Plan -Lab reviewed, adequate for treatment, will proceed irinotecan today and continue every 2 weeks -Follow-up in 2 weeks   SUMMARY OF ONCOLOGIC HISTORY: Oncology History Overview Note   Cancer Staging  Adenocarcinoma of duodenum (HCC) Staging form: Small Intestine - Adenocarcinoma, AJCC 8th Edition - Clinical stage from 03/18/2021: Stage IIIA (cTX, cN1, cM0) - Signed by Malachy Mood, MD on 06/22/2021 Stage prefix: Initial diagnosis     Adenocarcinoma of duodenum (HCC)  03/01/2021  Imaging   EXAM: ABDOMEN ULTRASOUND COMPLETE  IMPRESSION: 1. Simple appearing right renal cyst. 2. Otherwise negative abdominal ultrasound   03/18/2021 Procedure   Upper Endoscopy/Colonoscopy, Dr. Myrtie Neither  Upper Endoscopy Impression: - Normal esophagus. - A single submucosal papule (nodule) found in the stomach. - Duodenal mass. Biopsied. Concerning for malignancy.  Colonoscopy Impression: - Diverticulosis in the entire examined colon. - The examined portion of the ileum was normal. - Patent end-to-end colo-colonic anastomosis, characterized by healthy appearing mucosa. - Internal hemorrhoids. - No specimens collected.   03/18/2021 Initial Biopsy   Diagnosis Duodenum, Biopsy - INVASIVE MODERATE TO POORLY DIFFERENTIATED ADENOCARCINOMA   03/18/2021 Cancer Staging   Staging form: Small Intestine - Adenocarcinoma, AJCC 8th Edition - Clinical stage from 03/18/2021: Stage IIIA (cTX, cN1, cM0) - Signed by Malachy Mood, MD on 06/22/2021 Stage prefix: Initial diagnosis   03/22/2021 Initial Diagnosis   Adenocarcinoma of duodenum (HCC)   04/01/2021 Imaging   EXAM: CT CHEST, ABDOMEN, AND PELVIS WITH CONTRAST  IMPRESSION: 1. Circumferential wall thickening of the first portion of the duodenum, in keeping with known primary malignancy. 2. Enlarged, hypodense lymph nodes anterior to the pancreatic head and in the portacaval station, concerning for nodal metastatic disease. 3. No other evidence of metastatic disease in the chest, abdomen, or pelvis. 4. Incidental note of a fluid attenuation lesion within the proximal pancreatic body measuring 1.9 x 1.4 cm, with prominence of the pancreatic duct distally, up to 0.6 cm. This lesion was present on remote prior examination dated 05/04/2009, and has slightly increased in size over a long period of time. This is consistent with a small IPMN and benign given indolent behavior over greater than 10 years. 5. Background of very fine centrilobular  pulmonary nodules, most concentrated in the lung apices, most commonly seen in smoking-related respiratory bronchiolitis. 6. Coronary artery disease.   Aortic Atherosclerosis (ICD10-I70.0).    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 Of note, a variant of uncertain significance was detected in the ATM (p.A869G), BLM (c.800-3T>G), and SDHA (p.V632F) genes. Report date is 04/27/2021.  The CancerNext-Expanded gene panel offered by [facility] and includes sequencing, rearrangement, and RNA analysis for the following 77 genes: AIP, ALK, APC, ATM, AXIN2, BAP1, BARD1, BLM, BMPR1A, BRCA1, BRCA2, BRIP1, CDC73, CDH1, CDK4, CDKN1B, CDKN2A, CHEK2, CTNNA1, DICER1, FANCC, FH, FLCN, GALNT12, KIF1B, LZTR1, MAX, MEN1, MET, MLH1, MSH2, MSH3, MSH6, MUTYH, NBN, NF1, NF2, NTHL1, PALB2, PHOX2B, PMS2, POT1, PRKAR1A, PTCH1, PTEN, RAD51C, RAD51D, RB1, RECQL, RET, SDHA, SDHAF2, SDHB, SDHC, SDHD, SMAD4, SMARCA4, SMARCB1, SMARCE1, STK11, SUFU, TMEM127, TP53, TSC1, TSC2, VHL and XRCC2 (sequencing and deletion/duplication); EGFR, EGLN1, HOXB13, KIT, MITF, PDGFRA, POLD1, and POLE (sequencing only); EPCAM and GREM1 (deletion/duplication only).    05/11/2021 - 09/28/2021 Chemotherapy   Patient is on Treatment Plan : COLORECTAL Pembrolizumab (200) q21d     11/24/2021 - 05/03/2022 Chemotherapy   Patient is on Treatment Plan : COLORECTAL FOLFOX q14d x 4 months     02/20/2022 Imaging    IMPRESSION: 1. Distal stomach/duodenal mass is decreased in size when compared with prior exam. 2. Stable upper abdominal adenopathy. 3. 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 4. Aortic Atherosclerosis (ICD10-I70.0).   05/15/2022 Imaging    IMPRESSION: Improved masslike enlargement of the duodenal as well as  fold nodularity and thickening along the  stomach. In addition there are some small nodes identified in the upper abdomen which are similar overall compared to the study of December 2023   However there is a new spiculated nodule in the middle lobe which is worrisome. In addition there are some small low-attenuation liver lesions which are too small to completely characterize but were not clearly seen on the previous examination. Recommend further evaluation. For the liver lesions in particular a Eovist liver MRI may be of some benefit as clinically directed.   Persistent cystic lesion along the midbody of the pancreas with associated duct dilatation. A cystic neoplasm of the pancreas is possible.   06/06/2022 Imaging   MR Abdomen W Wo Contrast     IMPRESSION: 1. No definite evidence of hepatic metastatic disease. Scattered punctate foci of T2 hyperintensity throughout the liver are not associated with any suspicious enhancement or restricted diffusion and are favored to reflect small cysts or biliary hamartomas. Some of these are suboptimally characterized based on their small size, and given nonvisualization on older prior studies, attention on follow-up CT in 3-6 months recommended. 2. Unchanged residual irregular thickening of the walls of the distal stomach and proximal duodenum. 3. 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 This lesion is unchanged in size from recent CT, although has slowly enlarged from older prior examinations. Recommend follow-up abdominal MRI in 1 year. 4. Stable mild extrahepatic biliary dilatation status post cholecystectomy. 5. Aortic and branch vessel atherosclerosis, better seen on CT.   09/07/2022 -  Chemotherapy   Patient is on Treatment Plan : COLORECTAL FOLFIRI q14d        Discussed the use of AI scribe software for clinical note transcription with the patient, who  gave verbal consent to proceed.  History of Present Illness   The patient, a 77 year old with a history of adrenal cancer currently on chemotherapy, presents with recent episodes of abdominal pain and headache. The discomfort, described as a sensation of trapped gas, was localized to the stomach area and seemed to move around. The pain typically started between midnight and 2 AM and resolved by early morning. This pattern occurred for several consecutive nights but has since resolved. The patient denies associated nausea, vomiting, or changes in bowel habits.  In addition to the abdominal pain, the patient also reports knee pain, which is attributed to arthritis. The patient is currently on a regimen of irinotecan for his adrenal cancer and reports overall ease with this treatment compared to previous therapies.         All other systems were reviewed with the patient and are negative.  MEDICAL HISTORY:  Past Medical History:  Diagnosis Date   Anemia 10/31/2021   Dx IDA   Carotid stenosis, left    Colon cancer (HCC) 2000   Coronary artery disease    per Dr. 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, volume 11.5 ml   Stroke (HCC) 04/18/2021   TIA (transient ischemic attack)     SURGICAL HISTORY: Past Surgical History:  Procedure Laterality Date   CHOLECYSTECTOMY N/A 10/19/2021   Procedure: CHOLECYSTECTOMY;  Surgeon: Almond Lint, MD;  Location: [facility] OR;  Service: General;  Laterality: N/A;   COLON SURGERY  2000   colon cancer   COLONOSCOPY     ENDARTERECTOMY Left 04/25/2021   Procedure: LEFT ENDARTERECTOMY CAROTID;   Surgeon: Cephus Shelling, MD;  Location: [facility] OR;  Service: Vascular;  Laterality: Left;   EUS     pancreatic cyst   KNEE ARTHROSCOPY  2007   LT- [facility]   LAPAROSCOPY N/A 10/19/2021   Procedure: LAPAROSCOPY DIAGNOSTIC;  Surgeon: Almond Lint, MD;  Location: [facility] OR;  Service: General;  Laterality: N/A;  GENERAL AND TAP BLOCK   PATCH ANGIOPLASTY Left 04/25/2021   Procedure: PATCH ANGIOPLASTY XENOSURE 1CM X 6CM;  Surgeon: Cephus Shelling, MD;  [facility];  Service: Vascular;  Laterality: Left;   PORTACATH PLACEMENT Left 11/17/2021   Procedure: PORT PLACEMENT;  Surgeon: Almond Lint, MD;  Location: [facility] OR;  Service: General;  Laterality: Left;   PROSTATE BIOPSY  05/23/13   gleason 7, volume 11.5 ml   WHIPPLE PROCEDURE N/A 10/19/2021   Procedure: ATTEMPTED WHIPPLE PROCEDURE  WITH GASTRODUODENAL BYPASS;  Surgeon: Almond Lint, MD;  Location: [facility] OR;  Service: General;  Laterality: N/A;  GENERAL AND TAP BLOCK    I have reviewed the social history and family history with the patient and they are unchanged from previous note.  ALLERGIES:  is allergic to lyrica [pregabalin] and ciprofloxacin.  MEDICATIONS:  Current Outpatient Medications  Medication Sig Dispense Refill   amLODipine (NORVASC) 10 MG tablet Take 1 tablet by mouth once daily 90 tablet 0   aspirin EC 81 MG EC tablet Take 1 tablet (81 mg total) by mouth daily. Swallow whole. 30 tablet 11   atorvastatin (LIPITOR) 40 MG tablet Take 1 tablet by mouth once daily 90 tablet 2   b complex vitamins capsule Take 1 capsule by mouth daily.     benazepril (LOTENSIN) 40 MG tablet Take 1 tablet by mouth once daily 90 tablet 2   carvedilol (COREG) 12.5 MG tablet TAKE 1 TABLET BY MOUTH 2 TIMES DAILY. 180 tablet 3   cyanocobalamin (VITAMIN B12) 500 MCG tablet Take 500 mcg by mouth daily.     DULoxetine (CYMBALTA) 30 MG capsule Take 1 capsule (30 mg total) by mouth daily. 55 capsule 1   iron polysaccharides (NIFEREX) 150 MG capsule TAKE 1  CAPSULE BY MOUTH TWICE A DAY 60 capsule 2   potassium chloride SA (KLOR-CON M20) 20 MEQ tablet Take 1 tablet (20 mEq total) by mouth 3 (three) times daily. 90 tablet 2   prochlorperazine (COMPAZINE) 10 MG tablet Take 1 tablet (10 mg total) by mouth every 6 (six) hours as needed for nausea or vomiting (Use for nausea and / or vomiting unresolved with ondansetron (Zofran).). 30 tablet 0   traMADol (ULTRAM) 50 MG tablet TAKE 1 TABLET BY MOUTH EVERY 12 HOURS AS NEEDED 60 tablet 2   Travoprost, BAK Free, (TRAVATAN) 0.004 % SOLN ophthalmic solution Place 1 drop into both eyes at bedtime.     VITAMIN D PO Take 2 tablets by mouth daily. Gummies     No current facility-administered medications for this visit.   Facility-Administered Medications Ordered in Other Visits  Medication Dose Route Frequency Provider Last Rate Last Admin   sodium chloride flush (NS) 0.9 % injection 10 mL  10 mL Intracatheter PRN Malachy Mood, MD   10 mL at 01/25/23  1230    PHYSICAL EXAMINATION: ECOG PERFORMANCE STATUS: 1 - Symptomatic but completely ambulatory  Vitals:   01/25/23 0914  BP: (!) 158/71  Pulse: 78  Resp: 19  Temp: 97.6 F (36.4 C)  SpO2: 100%   Wt Readings from Last 3 Encounters:  01/25/23 222 lb 1.6 oz (100.7 kg)  01/11/23 220 lb 12.8 oz (100.2 kg)  12/21/22 222 lb 1.6 oz (100.7 kg)     GENERAL:alert, no distress and comfortable SKIN: skin color, texture, turgor are normal, no rashes or significant lesions EYES: normal, Conjunctiva are pink and non-injected, sclera clear NECK: supple, thyroid normal size, non-tender, without nodularity LYMPH:  no palpable lymphadenopathy in the cervical, axillary  LUNGS: clear to auscultation and percussion with normal breathing effort HEART: regular rate & rhythm and no murmurs and no lower extremity edema ABDOMEN:abdomen soft, non-tender and normal bowel sounds Musculoskeletal:no cyanosis of digits and no clubbing  NEURO: alert & oriented x 3 with fluent speech,  no focal motor/sensory deficits    LABORATORY DATA:  I have reviewed the data as listed    Latest Ref Rng & Units 01/25/2023    8:47 AM 01/11/2023    9:15 AM 12/21/2022   11:01 AM  CBC  WBC 4.0 - 10.5 K/uL 2.8  4.4  6.6   Hemoglobin 13.0 - 17.0 g/dL 09.8  11.9  14.7   Hematocrit 39.0 - 52.0 % 39.9  40.7  37.6   Platelets 150 - 400 K/uL 193  162  162         Latest Ref Rng & Units 01/25/2023    8:47 AM 01/11/2023    9:15 AM 12/21/2022   11:01 AM  CMP  Glucose 70 - 99 mg/dL 829  98  562   BUN 8 - 23 mg/dL 17  17  25    Creatinine 0.61 - 1.24 mg/dL 1.30  8.65  7.84   Sodium 135 - 145 mmol/L 143  145  143   Potassium 3.5 - 5.1 mmol/L 3.8  3.5  3.8   Chloride 98 - 111 mmol/L 109  111  111   CO2 22 - 32 mmol/L 29  30  29    Calcium 8.9 - 10.3 mg/dL 8.9  9.1  8.9   Total Protein 6.5 - 8.1 g/dL 6.4  6.5  5.9   Total Bilirubin <1.2 mg/dL 0.9  0.8  1.1   Alkaline Phos 38 - 126 U/L 106  120  86   AST 15 - 41 U/L 16  18  21    ALT 0 - 44 U/L 15  15  27        RADIOGRAPHIC STUDIES: I have personally reviewed the radiological images as listed and agreed with the findings in the report. No results found.    Orders Placed This Encounter  Procedures   CBC with Differential (Cancer Center Only)    Standing Status:   Future    Standing Expiration Date:   03/28/2024   CMP (Cancer Center only)    Standing Status:   Future    Standing Expiration Date:   03/28/2024   CBC with Differential (Cancer Center Only)    Standing Status:   Future    Standing Expiration Date:   04/11/2024   CMP (Cancer Center only)    Standing Status:   Future    Standing Expiration Date:   04/11/2024   All questions were answered. The patient knows to call the clinic with any problems, questions or concerns. No  barriers to learning was detected. The total time spent in the appointment was 25 minutes.     Malachy Mood, MD 01/25/2023

## 2023-01-25 NOTE — Patient Instructions (Signed)
[facility] - A DEPT OF [facility]  Discharge Instructions: Thank you for choosing [facility] to provide your oncology and hematology care.   If you have a lab appointment with the Cancer Center, please go directly to the Cancer Center and check in at the registration area.   Wear comfortable clothing and clothing appropriate for easy access to any Portacath or PICC line.   We strive to give you quality time with your provider. You may need to reschedule your appointment if you arrive late (15 or more minutes).  Arriving late affects you and other patients whose appointments are after yours.  Also, if you miss three or more appointments without notifying the office, you may be dismissed from the clinic at the provider's discretion.      For prescription refill requests, have your pharmacy contact our office and allow 72 hours for refills to be completed.    Today you received the following chemotherapy and/or immunotherapy agents: Irinotecan      To help prevent nausea and vomiting after your treatment, we encourage you to take your nausea medication as directed.  BELOW ARE SYMPTOMS THAT SHOULD BE REPORTED IMMEDIATELY: *FEVER GREATER THAN 100.4 F (38 C) OR HIGHER *CHILLS OR SWEATING *NAUSEA AND VOMITING THAT IS NOT CONTROLLED WITH YOUR NAUSEA MEDICATION *UNUSUAL SHORTNESS OF BREATH *UNUSUAL BRUISING OR BLEEDING *URINARY PROBLEMS (pain or burning when urinating, or frequent urination) *BOWEL PROBLEMS (unusual diarrhea, constipation, pain near the anus) TENDERNESS IN MOUTH AND THROAT WITH OR WITHOUT PRESENCE OF ULCERS (sore throat, sores in mouth, or a toothache) UNUSUAL RASH, SWELLING OR PAIN  UNUSUAL VAGINAL DISCHARGE OR ITCHING   Items with * indicate a potential emergency and should be followed up as soon as possible or go to the Emergency Department if any problems should occur.  Please show the CHEMOTHERAPY ALERT CARD or  IMMUNOTHERAPY ALERT CARD at check-in to the Emergency Department and triage nurse.  Should you have questions after your visit or need to cancel or reschedule your appointment, please contact [facility] - A DEPT OF [facility]  Dept: [phone]  and follow the prompts.  Office hours are 8:00 a.m. to 4:30 p.m. Monday - Friday. Please note that voicemails left after 4:00 p.m. may not be returned until the following business day.  We are closed weekends and major holidays. You have access to a nurse at all times for urgent questions. Please call the main number to the clinic Dept: [phone] and follow the prompts.   For any non-urgent questions, you may also contact your provider using MyChart. We now offer e-Visits for anyone 22 and older to request care online for non-urgent symptoms. For details visit [email or web address].   Also download the MyChart app! Go to the app store, search "MyChart", open the app, select [facility], and log in with your MyChart username and password.

## 2023-01-31 ENCOUNTER — Other Ambulatory Visit: Payer: Self-pay | Admitting: Internal Medicine

## 2023-01-31 ENCOUNTER — Other Ambulatory Visit: Payer: Self-pay

## 2023-02-08 ENCOUNTER — Inpatient Hospital Stay: Payer: Medicare Other | Admitting: Hematology

## 2023-02-08 ENCOUNTER — Inpatient Hospital Stay: Payer: Medicare Other

## 2023-02-08 ENCOUNTER — Encounter: Payer: Self-pay | Admitting: Hematology

## 2023-02-08 VITALS — BP 125/58 | HR 77 | Temp 98.3°F | Resp 18 | Ht 66.0 in | Wt 219.2 lb

## 2023-02-08 DIAGNOSIS — C170 Malignant neoplasm of duodenum: Secondary | ICD-10-CM

## 2023-02-08 LAB — CBC WITH DIFFERENTIAL (CANCER CENTER ONLY)
Abs Immature Granulocytes: 0.05 10*3/uL (ref 0.00–0.07)
Basophils Absolute: 0 10*3/uL (ref 0.0–0.1)
Basophils Relative: 1 %
Eosinophils Absolute: 0.1 10*3/uL (ref 0.0–0.5)
Eosinophils Relative: 3 %
HCT: 37.7 % — ABNORMAL LOW (ref 39.0–52.0)
Hemoglobin: 12 g/dL — ABNORMAL LOW (ref 13.0–17.0)
Immature Granulocytes: 1 %
Lymphocytes Relative: 22 %
Lymphs Abs: 0.9 10*3/uL (ref 0.7–4.0)
MCH: 26.9 pg (ref 26.0–34.0)
MCHC: 31.8 g/dL (ref 30.0–36.0)
MCV: 84.5 fL (ref 80.0–100.0)
Monocytes Absolute: 0.7 10*3/uL (ref 0.1–1.0)
Monocytes Relative: 18 %
Neutro Abs: 2.2 10*3/uL (ref 1.7–7.7)
Neutrophils Relative %: 55 %
Platelet Count: 180 10*3/uL (ref 150–400)
RBC: 4.46 MIL/uL (ref 4.22–5.81)
RDW: 15.5 % (ref 11.5–15.5)
WBC Count: 4 10*3/uL (ref 4.0–10.5)
nRBC: 0.5 % — ABNORMAL HIGH (ref 0.0–0.2)

## 2023-02-08 LAB — CMP (CANCER CENTER ONLY)
ALT: 24 U/L (ref 0–44)
AST: 18 U/L (ref 15–41)
Albumin: 3.1 g/dL — ABNORMAL LOW (ref 3.5–5.0)
Alkaline Phosphatase: 78 U/L (ref 38–126)
Anion gap: 4 — ABNORMAL LOW (ref 5–15)
BUN: 13 mg/dL (ref 8–23)
CO2: 28 mmol/L (ref 22–32)
Calcium: 8.7 mg/dL — ABNORMAL LOW (ref 8.9–10.3)
Chloride: 111 mmol/L (ref 98–111)
Creatinine: 1.03 mg/dL (ref 0.61–1.24)
GFR, Estimated: >60 mL/min (ref >60)
Glucose, Bld: 107 mg/dL — ABNORMAL HIGH (ref 70–99)
Potassium: 4 mmol/L (ref 3.5–5.1)
Sodium: 143 mmol/L (ref 135–145)
Total Bilirubin: 0.7 mg/dL (ref <1.2)
Total Protein: 6.1 g/dL — ABNORMAL LOW (ref 6.5–8.1)

## 2023-02-08 MED ORDER — ATROPINE SULFATE 1 MG/ML IV SOLN
0.5000 mg | Freq: Once | INTRAVENOUS | Status: AC | PRN
  Administered 2023-02-08: 0.5 mg via INTRAVENOUS
  Filled 2023-02-08: qty 1

## 2023-02-08 MED ORDER — SODIUM CHLORIDE 0.9 % IV SOLN: Freq: Once | INTRAVENOUS | Status: AC

## 2023-02-08 MED ORDER — PALONOSETRON HCL INJECTION 0.25 MG/5ML
0.2500 mg | Freq: Once | INTRAVENOUS | Status: AC
Start: 2023-02-08 — End: 2023-02-08
  Administered 2023-02-08: 0.25 mg via INTRAVENOUS
  Filled 2023-02-08: qty 5

## 2023-02-08 MED ORDER — SODIUM CHLORIDE 0.9% FLUSH
10.0000 mL | INTRAVENOUS | Status: DC | PRN
  Administered 2023-02-08: 10 mL

## 2023-02-08 MED ORDER — DEXAMETHASONE SODIUM PHOSPHATE 10 MG/ML IJ SOLN
10.0000 mg | Freq: Once | INTRAMUSCULAR | Status: AC
  Administered 2023-02-08: 10 mg via INTRAVENOUS
  Filled 2023-02-08: qty 1

## 2023-02-08 MED ORDER — HEPARIN SOD (PORK) LOCK FLUSH 100 UNIT/ML IV SOLN
500.0000 [IU] | Freq: Once | INTRAVENOUS | Status: AC | PRN
  Administered 2023-02-08: 500 [IU]

## 2023-02-08 MED ORDER — SODIUM CHLORIDE 0.9 % IV SOLN
120.0000 mg/m2 | Freq: Once | INTRAVENOUS | Status: AC
  Administered 2023-02-08: 260 mg via INTRAVENOUS
  Filled 2023-02-08: qty 13

## 2023-02-08 NOTE — Progress Notes (Signed)
[facility]   [phone] [phone]   Clinic Follow up Note   Patient Care Team: Corwin Levins, MD as PCP - General Jens Som, Madolyn Frieze, MD as PCP - Cardiology (Cardiology) Iva Boop, MD as Consulting Physician (Gastroenterology) Gean Birchwood, MD as Consulting Physician (Orthopedic Surgery) Sallye Lat, MD as Consulting Physician (Ophthalmology) Malachy Mood, MD as Consulting Physician (Oncology)  Date of Service:  02/08/2023  CHIEF COMPLAINT: f/u of duodenal adenocarcinoma  CURRENT THERAPY:  Maintenance irinotecan every 2 weeks  Oncology History   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 Treatment delayed due to his stroke on 04/18/21. S/p 8 cycles neoadjuvant Keytruda 05/11/21 - 09/28/21. He tolerated well with mild fatigue.  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 -He began FOLFOX on 11/24/21. He has tolerated well overall with some taste changes and mild fatigue -restaging CT scan 02/20/2022 showed PR -restaging CT from 05/15/2022 showed improved masslike enlargement of the duodenal as well as fold nodularity and thickening along the stomach, however it showed a new liver lesion, indeterminate, will obtain liver MRI  -Patient complained of worsening fatigue, last cycle chemo was held on 05/18/22. -Liver MRI in March 2024 showed benign liver lesions and a cystic lesion in pancreas, no evidence of liver metastasis.   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  No evidence of distant metastasis. -Due to the cancer progression, and his persistent peripheral neuropathy,  I recommend change treatment to second line FOLFIRI, he started on 09/07/22, he is tolerating well so far  -Due to his moderate fatigue, I reduced irinotecan dose slightly from C3 -His performance status has been low due to significant fatigue, required chemo dose reduction and chemo break  --Restaging CT scan from December 04, 2022 showed excellent response, the primary duodenal mass is not really measurable, no other signs of metastasis. -I changed his treatment to maintenance irrinotecan alone on  12/21/2022. He is tolerating better     Assessment and Plan    Adenocarcinoma Follow-up for adenocarcinoma. Reports fatigue, consistent with previous visits. Tumor markers stable at 7-8. Last scan in September; next scan planned for January. Experienced three days of diarrhea, resolved with liquid Imodium. Reports gas pain but no nausea. Discussed adjusting infusion schedule for holidays, with next infusion on December 12th, then every three weeks until January, resuming every two weeks thereafter. - Schedule next scan for January - Continue current treatment regimen - Use liquid Imodium for diarrhea - Use Gas-X for gas pain as needed - Ensure adequate fluid intake during episodes of diarrhea - Adjust infusion schedule to December 12th, then every three weeks until January, resuming every two weeks thereafter  Medication Management Taking tramadol for knee pain and potassium three times daily, maintaining normal levels. Mentioned a medication prescribed by urologist for urinary issues, name not recalled. - Continue tramadol for knee pain - Continue potassium, consider reducing to twice daily - Confirm and document the urologist-prescribed medication at the next visit  Plan -Lab reviewed, adequate for treatment, will proceed treatment today  -Due to the upcoming holidays, we will change his next 2 treatments to every 3 weeks, then resume treatment every 2 weeks in January 2025  SUMMARY OF  ONCOLOGIC HISTORY: Oncology History Overview Note   Cancer Staging  Adenocarcinoma of duodenum [facility]) Staging form: Small Intestine - Adenocarcinoma, AJCC 8th Edition - Clinical stage from 03/18/2021: Stage IIIA (cTX, cN1, cM0) - Signed by Malachy Mood, MD on 06/22/2021 Stage prefix: Initial diagnosis     Adenocarcinoma of duodenum (HCC)  03/01/2021 Imaging   EXAM: ABDOMEN ULTRASOUND COMPLETE  IMPRESSION: 1. Simple appearing right renal cyst. 2. Otherwise negative abdominal ultrasound   03/18/2021 Procedure   Upper Endoscopy/Colonoscopy, Dr. Myrtie Neither  Upper Endoscopy Impression: - Normal esophagus. - A single submucosal papule (nodule) found in the stomach. - Duodenal mass. Biopsied. Concerning for malignancy.  Colonoscopy Impression: - Diverticulosis in the entire examined colon. - The examined portion of the ileum was normal. - Patent end-to-end colo-colonic anastomosis, characterized by healthy appearing mucosa. - Internal hemorrhoids. - No specimens collected.   03/18/2021 Initial Biopsy   Diagnosis Duodenum, Biopsy - INVASIVE MODERATE TO POORLY DIFFERENTIATED ADENOCARCINOMA   03/18/2021 Cancer Staging   Staging form: Small Intestine - Adenocarcinoma, AJCC 8th Edition - Clinical stage from 03/18/2021: Stage IIIA (cTX, cN1, cM0) - Signed by Malachy Mood, MD on 06/22/2021 Stage prefix: Initial diagnosis   03/22/2021 Initial Diagnosis   Adenocarcinoma of duodenum (HCC)   04/01/2021 Imaging   EXAM: CT CHEST, ABDOMEN, AND PELVIS WITH CONTRAST  IMPRESSION: 1. Circumferential wall thickening of the first portion of the duodenum, in keeping with known primary malignancy. 2. Enlarged, hypodense lymph nodes anterior to the pancreatic head and in the portacaval station, concerning for nodal metastatic disease. 3. No other evidence of metastatic disease in the chest, abdomen, or pelvis. 4. Incidental note of a fluid attenuation lesion within the proximal pancreatic body measuring 1.9  x 1.4 cm, with prominence of the pancreatic duct distally, up to 0.6 cm. This lesion was present on remote prior examination dated 05/04/2009, and has slightly increased in size over a long period of time. This is consistent with a small IPMN and benign given indolent behavior over greater than 10 years. 5. Background of very fine centrilobular pulmonary nodules, most concentrated in the lung apices, most commonly seen in smoking-related respiratory bronchiolitis. 6. Coronary artery disease.   Aortic Atherosclerosis (ICD10-I70.0).    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 Of note, a variant of uncertain significance was detected in the ATM (p.A869G), BLM (c.800-3T>G), and SDHA (p.V632F) genes. Report date is 04/27/2021.  The CancerNext-Expanded gene panel offered by [facility] and includes sequencing, rearrangement, and RNA analysis for the following 77 genes: AIP, ALK, APC, ATM, AXIN2, BAP1, BARD1, BLM, BMPR1A, BRCA1, BRCA2, BRIP1, CDC73, CDH1, CDK4, CDKN1B, CDKN2A, CHEK2, CTNNA1, DICER1, FANCC, FH, FLCN, GALNT12, KIF1B, LZTR1, MAX, MEN1, MET, MLH1, MSH2, MSH3, MSH6, MUTYH, NBN, NF1, NF2, NTHL1, PALB2, PHOX2B, PMS2, POT1, PRKAR1A, PTCH1, PTEN, RAD51C, RAD51D, RB1, RECQL, RET, SDHA, SDHAF2, SDHB, SDHC, SDHD, SMAD4, SMARCA4, SMARCB1, SMARCE1, STK11, SUFU, TMEM127, TP53, TSC1, TSC2, VHL and XRCC2 (sequencing and deletion/duplication); EGFR, EGLN1, HOXB13, KIT, MITF, PDGFRA, POLD1, and POLE (sequencing only); EPCAM and GREM1 (deletion/duplication only).    05/11/2021 - 09/28/2021 Chemotherapy   Patient is on Treatment Plan : COLORECTAL Pembrolizumab (200) q21d     11/24/2021 - 05/03/2022 Chemotherapy   Patient is on Treatment Plan : COLORECTAL FOLFOX q14d x 4 months     02/20/2022 Imaging    IMPRESSION: 1. Distal stomach/duodenal mass is decreased in size when compared with prior  exam. 2. Stable upper abdominal adenopathy. 3. 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 4. Aortic Atherosclerosis (ICD10-I70.0).   05/15/2022 Imaging    IMPRESSION: Improved masslike enlargement of the duodenal as well as fold nodularity and thickening along the stomach. In addition there are some small nodes identified in the upper abdomen which are similar overall compared to the study of December 2023   However there is a new spiculated nodule in the middle lobe which is worrisome. In addition there are some small low-attenuation liver lesions which are too small to completely characterize but were not clearly seen on the previous examination. Recommend further evaluation. For the liver lesions in particular a Eovist liver MRI may be of some benefit as clinically directed.   Persistent cystic lesion along the midbody of the pancreas with associated duct dilatation. A cystic neoplasm of the pancreas is possible.   06/06/2022 Imaging   MR Abdomen W Wo Contrast     IMPRESSION: 1. No definite evidence of hepatic metastatic disease. Scattered punctate foci of T2 hyperintensity throughout the liver are not associated with any suspicious enhancement or restricted diffusion and are favored to reflect small cysts or biliary hamartomas. Some of these are suboptimally characterized based on their small size, and given nonvisualization on older prior studies, attention on follow-up CT in 3-6 months recommended. 2. Unchanged residual irregular thickening of the walls of the distal stomach and proximal duodenum. 3. 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 This lesion is unchanged in size from recent CT, although has slowly enlarged from older prior examinations. Recommend follow-up abdominal MRI  in 1 year. 4. Stable mild extrahepatic biliary dilatation status post cholecystectomy. 5. Aortic and branch vessel atherosclerosis, better seen on CT.   09/07/2022 -  Chemotherapy   Patient is on Treatment Plan : COLORECTAL FOLFIRI q14d        Discussed the use of AI scribe software for clinical note transcription with the patient, who gave verbal consent to proceed.  History of Present Illness   The patient, a 77 year old gentleman with a history of adenocarcinoma, presents for follow-up. He reports feeling tired, which is consistent with his baseline. He also experienced a bout of diarrhea lasting three days, which has since resolved. He managed the diarrhea with Imodium, initially trying gel tabs and then switching to the liquid form, which he found more effective. He also reports experiencing gas pain, which he has been managing with Gas-X with variable success. He denies any nausea.  In addition to his oncological treatment, the patient has been seeing a urologist for urinary symptoms. He has been prescribed a medication, the name of which he cannot recall, that has been helping with his urinary symptoms. He also takes tramadol for knee pain and potassium three times a day.         All other systems were reviewed with the patient and are negative.  MEDICAL HISTORY:  Past Medical History:  Diagnosis Date   Anemia 10/31/2021   Dx IDA   Carotid stenosis, left    Colon cancer (HCC) 2000   Coronary artery disease    per Dr. 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   GERD (gastroesophageal reflux disease)    Glaucoma    History of chemotherapy 2000   colon cancer  History of radiation therapy 08/06/13- 10/03/13   prostate 7800 cGy 40 sessions, seminal vesicles 5600 cGy 40 sessions   Hyperlipidemia    Hypertension    Left knee DJD    Prostate cancer (HCC) 05/23/2013   gleason 3+4=7, volume 11.5 ml   Stroke (HCC) 04/18/2021    TIA (transient ischemic attack)     SURGICAL HISTORY: Past Surgical History:  Procedure Laterality Date   CHOLECYSTECTOMY N/A 10/19/2021   Procedure: CHOLECYSTECTOMY;  Surgeon: Almond Lint, MD;  Location: [facility] OR;  Service: General;  Laterality: N/A;   COLON SURGERY  2000   colon cancer   COLONOSCOPY     ENDARTERECTOMY Left 04/25/2021   Procedure: LEFT ENDARTERECTOMY CAROTID;  Surgeon: Cephus Shelling, MD;  Location: [facility] OR;  Service: Vascular;  Laterality: Left;   EUS     pancreatic cyst   KNEE ARTHROSCOPY  2007   LT- [facility]   LAPAROSCOPY N/A 10/19/2021   Procedure: LAPAROSCOPY DIAGNOSTIC;  Surgeon: Almond Lint, MD;  Location: [facility] OR;  Service: General;  Laterality: N/A;  GENERAL AND TAP BLOCK   PATCH ANGIOPLASTY Left 04/25/2021   Procedure: PATCH ANGIOPLASTY XENOSURE 1CM X 6CM;  Surgeon: Cephus Shelling, MD;  Location: [facility] OR;  Service: Vascular;  Laterality: Left;   PORTACATH PLACEMENT Left 11/17/2021   Procedure: PORT PLACEMENT;  Surgeon: Almond Lint, MD;  Location: [facility] OR;  Service: General;  Laterality: Left;   PROSTATE BIOPSY  05/23/13   gleason 7, volume 11.5 ml   WHIPPLE PROCEDURE N/A 10/19/2021   Procedure: ATTEMPTED WHIPPLE PROCEDURE  WITH GASTRODUODENAL BYPASS;  Surgeon: Almond Lint, MD;  Location: [facility] OR;  Service: General;  Laterality: N/A;  GENERAL AND TAP BLOCK    I have reviewed the social history and family history with the patient and they are unchanged from previous note.  ALLERGIES:  is allergic to lyrica [pregabalin] and ciprofloxacin.  MEDICATIONS:  Current Outpatient Medications  Medication Sig Dispense Refill   amLODipine (NORVASC) 10 MG tablet Take 1 tablet by mouth once daily 90 tablet 0   aspirin EC 81 MG EC tablet Take 1 tablet (81 mg total) by mouth daily. Swallow whole. 30 tablet 11   atorvastatin (LIPITOR) 40 MG tablet Take 1 tablet by mouth once daily 90 tablet 2   b complex vitamins capsule Take 1 capsule by mouth daily.     benazepril  (LOTENSIN) 40 MG tablet Take 1 tablet by mouth once daily 90 tablet 2   carvedilol (COREG) 12.5 MG tablet TAKE 1 TABLET BY MOUTH 2 TIMES DAILY. 180 tablet 3   cyanocobalamin (VITAMIN B12) 500 MCG tablet Take 500 mcg by mouth daily.     DULoxetine (CYMBALTA) 30 MG capsule Take 1 capsule (30 mg total) by mouth daily. 55 capsule 1   iron polysaccharides (NIFEREX) 150 MG capsule TAKE 1 CAPSULE BY MOUTH TWICE A DAY 60 capsule 2   potassium chloride SA (KLOR-CON M20) 20 MEQ tablet Take 1 tablet (20 mEq total) by mouth 3 (three) times daily. 90 tablet 2   prochlorperazine (COMPAZINE) 10 MG tablet Take 1 tablet (10 mg total) by mouth every 6 (six) hours as needed for nausea or vomiting (Use for nausea and / or vomiting unresolved with ondansetron (Zofran).). 30 tablet 0   traMADol (ULTRAM) 50 MG tablet TAKE 1 TABLET BY MOUTH EVERY 12 HOURS AS NEEDED 60 tablet 2   Travoprost, BAK Free, (TRAVATAN) 0.004 % SOLN ophthalmic solution Place 1 drop into both eyes at bedtime.  VITAMIN D PO Take 2 tablets by mouth daily. Gummies     No current facility-administered medications for this visit.   Facility-Administered Medications Ordered in Other Visits  Medication Dose Route Frequency Provider Last Rate Last Admin   heparin lock flush 100 unit/mL  500 Units Intracatheter Once PRN Malachy Mood, MD       irinotecan (CAMPTOSAR) 260 mg in sodium chloride 0.9 % 500 mL chemo infusion  120 mg/m2 (Treatment Plan Recorded) Intravenous Once Malachy Mood, MD 342 mL/hr at 02/08/23 1226 260 mg at 02/08/23 1226   sodium chloride flush (NS) 0.9 % injection 10 mL  10 mL Intracatheter PRN Malachy Mood, MD        PHYSICAL EXAMINATION: ECOG PERFORMANCE STATUS: 1 - Symptomatic but completely ambulatory  Vitals:   02/08/23 1121  BP: (!) 125/58  Pulse: 77  Resp: 18  Temp: 98.3 F (36.8 C)  SpO2: 98%   Wt Readings from Last 3 Encounters:  02/08/23 219 lb 3.2 oz (99.4 kg)  01/25/23 222 lb 1.6 oz (100.7 kg)  01/11/23 220 lb 12.8  oz (100.2 kg)     GENERAL:alert, no distress and comfortable SKIN: skin color, texture, turgor are normal, no rashes or significant lesions EYES: normal, Conjunctiva are pink and non-injected, sclera clear NECK: supple, thyroid normal size, non-tender, without nodularity LYMPH:  no palpable lymphadenopathy in the cervical, axillary  LUNGS: clear to auscultation and percussion with normal breathing effort HEART: regular rate & rhythm and no murmurs and no lower extremity edema ABDOMEN:abdomen soft, non-tender and normal bowel sounds Musculoskeletal:no cyanosis of digits and no clubbing  NEURO: alert & oriented x 3 with fluent speech, no focal motor/sensory deficits    LABORATORY DATA:  I have reviewed the data as listed    Latest Ref Rng & Units 02/08/2023   10:28 AM 01/25/2023    8:47 AM 01/11/2023    9:15 AM  CBC  WBC 4.0 - 10.5 K/uL 4.0  2.8  4.4   Hemoglobin 13.0 - 17.0 g/dL 47.4  25.9  56.3   Hematocrit 39.0 - 52.0 % 37.7  39.9  40.7   Platelets 150 - 400 K/uL 180  193  162         Latest Ref Rng & Units 02/08/2023   10:28 AM 01/25/2023    8:47 AM 01/11/2023    9:15 AM  CMP  Glucose 70 - 99 mg/dL 875  643  98   BUN 8 - 23 mg/dL 13  17  17    Creatinine 0.61 - 1.24 mg/dL 3.29  5.18  8.41   Sodium 135 - 145 mmol/L 143  143  145   Potassium 3.5 - 5.1 mmol/L 4.0  3.8  3.5   Chloride 98 - 111 mmol/L 111  109  111   CO2 22 - 32 mmol/L 28  29  30    Calcium 8.9 - 10.3 mg/dL 8.7  8.9  9.1   Total Protein 6.5 - 8.1 g/dL 6.1  6.4  6.5   Total Bilirubin <1.2 mg/dL 0.7  0.9  0.8   Alkaline Phos 38 - 126 U/L 78  106  120   AST 15 - 41 U/L 18  16  18    ALT 0 - 44 U/L 24  15  15        RADIOGRAPHIC STUDIES: I have personally reviewed the radiological images as listed and agreed with the findings in the report. No results found.    No orders of the defined  types were placed in this encounter.  All questions were answered. The patient knows to call the clinic with any  problems, questions or concerns. No barriers to learning was detected. The total time spent in the appointment was 25 minutes.     Malachy Mood, MD 02/08/2023

## 2023-02-08 NOTE — Patient Instructions (Signed)
 [facility] - A DEPT OF [facility]  Discharge Instructions: Thank you for choosing [facility] to provide your oncology and hematology care.   If you have a lab appointment with the Cancer Center, please go directly to the Cancer Center and check in at the registration area.   Wear comfortable clothing and clothing appropriate for easy access to any Portacath or PICC line.   We strive to give you quality time with your provider. You may need to reschedule your appointment if you arrive late (15 or more minutes).  Arriving late affects you and other patients whose appointments are after yours.  Also, if you miss three or more appointments without notifying the office, you may be dismissed from the clinic at the provider's discretion.      For prescription refill requests, have your pharmacy contact our office and allow 72 hours for refills to be completed.    Today you received the following chemotherapy and/or immunotherapy agents: Irinotecan      To help prevent nausea and vomiting after your treatment, we encourage you to take your nausea medication as directed.  BELOW ARE SYMPTOMS THAT SHOULD BE REPORTED IMMEDIATELY: *FEVER GREATER THAN 100.4 F (38 C) OR HIGHER *CHILLS OR SWEATING *NAUSEA AND VOMITING THAT IS NOT CONTROLLED WITH YOUR NAUSEA MEDICATION *UNUSUAL SHORTNESS OF BREATH *UNUSUAL BRUISING OR BLEEDING *URINARY PROBLEMS (pain or burning when urinating, or frequent urination) *BOWEL PROBLEMS (unusual diarrhea, constipation, pain near the anus) TENDERNESS IN MOUTH AND THROAT WITH OR WITHOUT PRESENCE OF ULCERS (sore throat, sores in mouth, or a toothache) UNUSUAL RASH, SWELLING OR PAIN  UNUSUAL VAGINAL DISCHARGE OR ITCHING   Items with * indicate a potential emergency and should be followed up as soon as possible or go to the Emergency Department if any problems should occur.  Please show the CHEMOTHERAPY ALERT CARD or  IMMUNOTHERAPY ALERT CARD at check-in to the Emergency Department and triage nurse.  Should you have questions after your visit or need to cancel or reschedule your appointment, please contact [facility] - A DEPT OF [facility]  Dept: [phone]  and follow the prompts.  Office hours are 8:00 a.m. to 4:30 p.m. Monday - Friday. Please note that voicemails left after 4:00 p.m. may not be returned until the following business day.  We are closed weekends and major holidays. You have access to a nurse at all times for urgent questions. Please call the main number to the clinic Dept: [phone] and follow the prompts.   For any non-urgent questions, you may also contact your provider using MyChart. We now offer e-Visits for anyone 22 and older to request care online for non-urgent symptoms. For details visit [email or web address].   Also download the MyChart app! Go to the app store, search "MyChart", open the app, select [facility], and log in with your MyChart username and password.

## 2023-02-08 NOTE — Assessment & Plan Note (Signed)
diagnosed 02/2021 by endo-/colonoscopy for weight loss and anemia. Treatment delayed due to his stroke on 04/18/21. S/p 8 cycles neoadjuvant Keytruda 05/11/21 - 09/28/21. He tolerated well with mild fatigue.  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 -He began FOLFOX on 11/24/21. He has tolerated well overall with some taste changes and mild fatigue -restaging CT scan 02/20/2022 showed PR -restaging CT from 05/15/2022 showed improved masslike enlargement of the duodenal as well as fold nodularity and thickening along the stomach, however it showed a new liver lesion, indeterminate, will obtain liver MRI  -Patient complained of worsening fatigue, last cycle chemo was held on 05/18/22. -Liver MRI in March 2024 showed benign liver lesions and a cystic lesion in pancreas, no evidence of liver metastasis.   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  No evidence of distant metastasis. -Due to the cancer progression, and his persistent peripheral neuropathy, I recommend change treatment to second line FOLFIRI, he started on 09/07/22, he is tolerating well so far  -Due to his moderate fatigue, I reduced irinotecan dose slightly from C3 -His performance status has been low due to significant fatigue, required chemo dose reduction and chemo break  --Restaging CT scan from December 04, 2022 showed excellent response, the primary duodenal mass is not really measurable, no other signs of metastasis. -I changed his treatment to maintenance irrinotecan alone on  12/21/2022. He is tolerating better

## 2023-02-09 ENCOUNTER — Other Ambulatory Visit: Payer: Self-pay

## 2023-02-10 ENCOUNTER — Other Ambulatory Visit: Payer: Self-pay

## 2023-02-22 ENCOUNTER — Inpatient Hospital Stay: Payer: Medicare Other

## 2023-02-22 ENCOUNTER — Inpatient Hospital Stay: Payer: Medicare Other | Admitting: Nurse Practitioner

## 2023-02-25 ENCOUNTER — Other Ambulatory Visit: Payer: Self-pay | Admitting: Internal Medicine

## 2023-02-28 ENCOUNTER — Other Ambulatory Visit: Payer: Self-pay | Admitting: Internal Medicine

## 2023-03-01 ENCOUNTER — Encounter: Payer: Self-pay | Admitting: Hematology

## 2023-03-01 ENCOUNTER — Inpatient Hospital Stay: Payer: Medicare Other | Admitting: Hematology

## 2023-03-01 ENCOUNTER — Inpatient Hospital Stay: Payer: Medicare Other

## 2023-03-01 ENCOUNTER — Other Ambulatory Visit: Payer: Self-pay

## 2023-03-01 ENCOUNTER — Inpatient Hospital Stay: Payer: Medicare Other | Attending: Nurse Practitioner

## 2023-03-01 VITALS — BP 151/74 | HR 82 | Temp 97.8°F | Resp 15 | Wt 217.2 lb

## 2023-03-01 DIAGNOSIS — Z95828 Presence of other vascular implants and grafts: Secondary | ICD-10-CM

## 2023-03-01 DIAGNOSIS — Z5111 Encounter for antineoplastic chemotherapy: Secondary | ICD-10-CM | POA: Diagnosis present

## 2023-03-01 DIAGNOSIS — Z8546 Personal history of malignant neoplasm of prostate: Secondary | ICD-10-CM | POA: Insufficient documentation

## 2023-03-01 DIAGNOSIS — D500 Iron deficiency anemia secondary to blood loss (chronic): Secondary | ICD-10-CM

## 2023-03-01 DIAGNOSIS — C170 Malignant neoplasm of duodenum: Secondary | ICD-10-CM | POA: Diagnosis not present

## 2023-03-01 DIAGNOSIS — Z7982 Long term (current) use of aspirin: Secondary | ICD-10-CM | POA: Insufficient documentation

## 2023-03-01 DIAGNOSIS — Z79899 Other long term (current) drug therapy: Secondary | ICD-10-CM | POA: Insufficient documentation

## 2023-03-01 DIAGNOSIS — D508 Other iron deficiency anemias: Secondary | ICD-10-CM | POA: Insufficient documentation

## 2023-03-01 LAB — CBC WITH DIFFERENTIAL (CANCER CENTER ONLY)
Abs Immature Granulocytes: 0.06 10*3/uL (ref 0.00–0.07)
Basophils Absolute: 0.1 10*3/uL (ref 0.0–0.1)
Basophils Relative: 1 %
Eosinophils Absolute: 0.2 10*3/uL (ref 0.0–0.5)
Eosinophils Relative: 3 %
HCT: 34.7 % — ABNORMAL LOW (ref 39.0–52.0)
Hemoglobin: 11.3 g/dL — ABNORMAL LOW (ref 13.0–17.0)
Immature Granulocytes: 1 %
Lymphocytes Relative: 12 %
Lymphs Abs: 1 10*3/uL (ref 0.7–4.0)
MCH: 27.2 pg (ref 26.0–34.0)
MCHC: 32.6 g/dL (ref 30.0–36.0)
MCV: 83.4 fL (ref 80.0–100.0)
Monocytes Absolute: 1 10*3/uL (ref 0.1–1.0)
Monocytes Relative: 11 %
Neutro Abs: 6.4 10*3/uL (ref 1.7–7.7)
Neutrophils Relative %: 72 %
Platelet Count: 282 10*3/uL (ref 150–400)
RBC: 4.16 MIL/uL — ABNORMAL LOW (ref 4.22–5.81)
RDW: 17.1 % — ABNORMAL HIGH (ref 11.5–15.5)
WBC Count: 8.7 10*3/uL (ref 4.0–10.5)
nRBC: 0.2 % (ref 0.0–0.2)

## 2023-03-01 LAB — CMP (CANCER CENTER ONLY)
ALT: 28 U/L (ref 0–44)
AST: 19 U/L (ref 15–41)
Albumin: 3 g/dL — ABNORMAL LOW (ref 3.5–5.0)
Alkaline Phosphatase: 82 U/L (ref 38–126)
Anion gap: 4 — ABNORMAL LOW (ref 5–15)
BUN: 18 mg/dL (ref 8–23)
CO2: 29 mmol/L (ref 22–32)
Calcium: 8.9 mg/dL (ref 8.9–10.3)
Chloride: 109 mmol/L (ref 98–111)
Creatinine: 1.09 mg/dL (ref 0.61–1.24)
GFR, Estimated: >60 mL/min (ref >60)
Glucose, Bld: 111 mg/dL — ABNORMAL HIGH (ref 70–99)
Potassium: 4 mmol/L (ref 3.5–5.1)
Sodium: 142 mmol/L (ref 135–145)
Total Bilirubin: 0.7 mg/dL (ref <1.2)
Total Protein: 6.3 g/dL — ABNORMAL LOW (ref 6.5–8.1)

## 2023-03-01 LAB — CEA (IN HOUSE-CHCC): CEA (CHCC-In House): 6.38 ng/mL — ABNORMAL HIGH (ref 0.00–5.00)

## 2023-03-01 MED ORDER — SODIUM CHLORIDE 0.9% FLUSH
10.0000 mL | INTRAVENOUS | Status: DC | PRN
  Administered 2023-03-01: 10 mL

## 2023-03-01 MED ORDER — AMLODIPINE BESYLATE 10 MG PO TABS: ORAL_TABLET | ORAL | 3 refills | Status: DC

## 2023-03-01 MED ORDER — SODIUM CHLORIDE 0.9 % IV SOLN
Freq: Once | INTRAVENOUS | Status: AC
Start: 2023-03-01 — End: 2023-03-01

## 2023-03-01 MED ORDER — HEPARIN SOD (PORK) LOCK FLUSH 100 UNIT/ML IV SOLN
500.0000 [IU] | Freq: Once | INTRAVENOUS | Status: AC | PRN
  Administered 2023-03-01: 500 [IU]

## 2023-03-01 MED ORDER — ATROPINE SULFATE 1 MG/ML IV SOLN
0.5000 mg | Freq: Once | INTRAVENOUS | Status: AC | PRN
  Administered 2023-03-01: 0.5 mg via INTRAVENOUS
  Filled 2023-03-01: qty 1

## 2023-03-01 MED ORDER — SODIUM CHLORIDE 0.9 % IV SOLN
100.0000 mg/m2 | Freq: Once | INTRAVENOUS | Status: AC
  Administered 2023-03-01: 200 mg via INTRAVENOUS
  Filled 2023-03-01: qty 10

## 2023-03-01 MED ORDER — SODIUM CHLORIDE 0.9% FLUSH
10.0000 mL | Freq: Once | INTRAVENOUS | Status: AC
  Administered 2023-03-01: 10 mL

## 2023-03-01 MED ORDER — DEXAMETHASONE SODIUM PHOSPHATE 10 MG/ML IJ SOLN
10.0000 mg | Freq: Once | INTRAMUSCULAR | Status: AC
Start: 2023-03-01 — End: 2023-03-01
  Administered 2023-03-01: 10 mg via INTRAVENOUS
  Filled 2023-03-01: qty 1

## 2023-03-01 MED ORDER — DIPHENOXYLATE-ATROPINE 2.5-0.025 MG PO TABS: 1.0000 | ORAL_TABLET | Freq: Four times a day (QID) | ORAL | 0 refills | Status: DC | PRN

## 2023-03-01 MED ORDER — PALONOSETRON HCL INJECTION 0.25 MG/5ML
0.2500 mg | Freq: Once | INTRAVENOUS | Status: AC
  Administered 2023-03-01: 0.25 mg via INTRAVENOUS
  Filled 2023-03-01: qty 5

## 2023-03-01 NOTE — Patient Instructions (Signed)
[facility] - A DEPT OF [facility]  Discharge Instructions: Thank you for choosing [facility] to provide your oncology and hematology care.   If you have a lab appointment with the Cancer Center, please go directly to the Cancer Center and check in at the registration area.   Wear comfortable clothing and clothing appropriate for easy access to any Portacath or PICC line.   We strive to give you quality time with your provider. You may need to reschedule your appointment if you arrive late (15 or more minutes).  Arriving late affects you and other patients whose appointments are after yours.  Also, if you miss three or more appointments without notifying the office, you may be dismissed from the clinic at the provider's discretion.      For prescription refill requests, have your pharmacy contact our office and allow 72 hours for refills to be completed.    Today you received the following chemotherapy and/or immunotherapy agents: Irinotecan      To help prevent nausea and vomiting after your treatment, we encourage you to take your nausea medication as directed.  BELOW ARE SYMPTOMS THAT SHOULD BE REPORTED IMMEDIATELY: *FEVER GREATER THAN 100.4 F (38 C) OR HIGHER *CHILLS OR SWEATING *NAUSEA AND VOMITING THAT IS NOT CONTROLLED WITH YOUR NAUSEA MEDICATION *UNUSUAL SHORTNESS OF BREATH *UNUSUAL BRUISING OR BLEEDING *URINARY PROBLEMS (pain or burning when urinating, or frequent urination) *BOWEL PROBLEMS (unusual diarrhea, constipation, pain near the anus) TENDERNESS IN MOUTH AND THROAT WITH OR WITHOUT PRESENCE OF ULCERS (sore throat, sores in mouth, or a toothache) UNUSUAL RASH, SWELLING OR PAIN  UNUSUAL VAGINAL DISCHARGE OR ITCHING   Items with * indicate a potential emergency and should be followed up as soon as possible or go to the Emergency Department if any problems should occur.  Please show the CHEMOTHERAPY ALERT CARD or IMMUNOTHERAPY  ALERT CARD at check-in to the Emergency Department and triage nurse.  Should you have questions after your visit or need to cancel or reschedule your appointment, please contact [facility] - A DEPT OF [facility]  Dept: [phone]  and follow the prompts.  Office hours are 8:00 a.m. to 4:30 p.m. Monday - Friday. Please note that voicemails left after 4:00 p.m. may not be returned until the following business day.  We are closed weekends and major holidays. You have access to a nurse at all times for urgent questions. Please call the main number to the clinic Dept: [phone] and follow the prompts.   For any non-urgent questions, you may also contact your provider using MyChart. We now offer e-Visits for anyone 63 and older to request care online for non-urgent symptoms. For details visit [email or web address].   Also download the MyChart app! Go to the app store, search "MyChart", open the app, select [facility], and log in with your MyChart username and password.

## 2023-03-01 NOTE — Progress Notes (Signed)
[facility]   Telephone:(336) [phone] [phone]) [phone]   Clinic Follow up Note   Patient Care Team: Corwin Levins, MD as PCP - General Jens Som, Madolyn Frieze, MD as PCP - Cardiology (Cardiology) Iva Boop, MD as Consulting Physician (Gastroenterology) Gean Birchwood, MD as Consulting Physician (Orthopedic Surgery) Sallye Lat, MD as Consulting Physician (Ophthalmology) Malachy Mood, MD as Consulting Physician (Oncology)  Date of Service:  03/01/2023  CHIEF COMPLAINT: f/u of duodenal adenocarcinoma  CURRENT THERAPY:  And maintenance irinotecan every 2 weeks  Oncology History   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 Treatment delayed due to his stroke on 04/18/21. S/p 8 cycles neoadjuvant Keytruda 05/11/21 - 09/28/21. He tolerated well with mild fatigue.  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 -He began FOLFOX on 11/24/21. He has tolerated well overall with some taste changes and mild fatigue -restaging CT scan 02/20/2022 showed PR -restaging CT from 05/15/2022 showed improved masslike enlargement of the duodenal as well as fold nodularity and thickening along the stomach, however it showed a new liver lesion, indeterminate, will obtain liver MRI  -Patient complained of worsening fatigue, last cycle chemo was held on 05/18/22. -Liver MRI in March 2024 showed benign liver lesions and a cystic lesion in pancreas, no evidence of liver metastasis.   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  No evidence of distant metastasis. -Due to the cancer progression, and his persistent peripheral  neuropathy, I recommend change treatment to second line FOLFIRI, he started on 09/07/22, he is tolerating well so far  -Due to his moderate fatigue, I reduced irinotecan dose slightly from C3 -His performance status has been low due to significant fatigue, required chemo dose reduction and chemo break  --Restaging CT scan from December 04, 2022 showed excellent response, the primary duodenal mass is not really measurable, no other signs of metastasis. -I changed his treatment to maintenance irrinotecan alone on  12/21/2022. He is tolerating better    Assessment and Plan    Duodenal Cancer Follow-up for duodenal cancer. Recent diarrhea and anorexia resolved. No fever, myalgia, pharyngitis, or chills. Weight stable. Feels well for treatment. Chemotherapy dose reduced by 15-20% due to prolonged recovery and holidays. Discussed risks of reduced efficacy versus benefits of improved tolerance and quality of life. - Administer reduced chemotherapy dose by 15-20% - Order CT scan of chest and pelvis for mid-January - Schedule next appointment for January 2nd - Prescribe Lomotil (20 tablets) for diarrhea management - Advise adequate hydration during diarrhea  Diarrhea Diarrhea requiring daily Imodium for four days. Lomotil prescribed as a stronger alternative with caution to avoid constipation. Discussed risks of constipation and benefits of effective diarrhea control. - Prescribe Lomotil (20 tablets) - Advise Imodium first, Lomotil if necessary - Instruct on potential constipation with Lomotil - Ensure adequate hydration during diarrhea  General Health Maintenance Blood counts are within normal limits. - Monitor blood counts regularly  Plan -Will proceed chemotherapy today with slightly dose reduction of irinotecan due to prolonged recovery and pending holiday - Schedule CT scan for mid-January - Follow-up appointment on January 2nd.         SUMMARY OF ONCOLOGIC HISTORY: Oncology History  Overview Note  Cancer Staging  Adenocarcinoma of duodenum [place]) Staging form: Small Intestine - Adenocarcinoma, AJCC 8th Edition - Clinical stage from 03/18/2021: Stage IIIA (cTX, cN1, cM0) - Signed by Malachy Mood, MD on 06/22/2021 Stage prefix: Initial diagnosis     Adenocarcinoma of duodenum (HCC)  03/01/2021 Imaging   EXAM: ABDOMEN ULTRASOUND COMPLETE  IMPRESSION: 1. Simple appearing right renal cyst. 2. Otherwise negative abdominal ultrasound   03/18/2021 Procedure   Upper Endoscopy/Colonoscopy, Dr. Myrtie Neither  Upper Endoscopy Impression: - Normal esophagus. - A single submucosal papule (nodule) found in the stomach. - Duodenal mass. Biopsied. Concerning for malignancy.  Colonoscopy Impression: - Diverticulosis in the entire examined colon. - The examined portion of the ileum was normal. - Patent end-to-end colo-colonic anastomosis, characterized by healthy appearing mucosa. - Internal hemorrhoids. - No specimens collected.   03/18/2021 Initial Biopsy   Diagnosis Duodenum, Biopsy - INVASIVE MODERATE TO POORLY DIFFERENTIATED ADENOCARCINOMA   03/18/2021 Cancer Staging   Staging form: Small Intestine - Adenocarcinoma, AJCC 8th Edition - Clinical stage from 03/18/2021: Stage IIIA (cTX, cN1, cM0) - Signed by Malachy Mood, MD on 06/22/2021 Stage prefix: Initial diagnosis   03/22/2021 Initial Diagnosis   Adenocarcinoma of duodenum (HCC)   04/01/2021 Imaging   EXAM: CT CHEST, ABDOMEN, AND PELVIS WITH CONTRAST  IMPRESSION: 1. Circumferential wall thickening of the first portion of the duodenum, in keeping with known primary malignancy. 2. Enlarged, hypodense lymph nodes anterior to the pancreatic head and in the portacaval station, concerning for nodal metastatic disease. 3. No other evidence of metastatic disease in the chest, abdomen, or pelvis. 4. Incidental note of a fluid attenuation lesion within the proximal pancreatic body measuring 1.9 x 1.4 cm, with prominence of  the pancreatic duct distally, up to 0.6 cm. This lesion was present on remote prior examination dated 05/04/2009, and has slightly increased in size over a long period of time. This is consistent with a small IPMN and benign given indolent behavior over greater than 10 years. 5. Background of very fine centrilobular pulmonary nodules, most concentrated in the lung apices, most commonly seen in smoking-related respiratory bronchiolitis. 6. Coronary artery disease.   Aortic Atherosclerosis (ICD10-I70.0).    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 Of note, a variant of uncertain significance was detected in the ATM (p.A869G), BLM (c.800-3T>G), and SDHA (p.V632F) genes. Report date is 04/27/2021.  The CancerNext-Expanded gene panel offered by [facility] and includes sequencing, rearrangement, and RNA analysis for the following 77 genes: AIP, ALK, APC, ATM, AXIN2, BAP1, BARD1, BLM, BMPR1A, BRCA1, BRCA2, BRIP1, CDC73, CDH1, CDK4, CDKN1B, CDKN2A, CHEK2, CTNNA1, DICER1, FANCC, FH, FLCN, GALNT12, KIF1B, LZTR1, MAX, MEN1, MET, MLH1, MSH2, MSH3, MSH6, MUTYH, NBN, NF1, NF2, NTHL1, PALB2, PHOX2B, PMS2, POT1, PRKAR1A, PTCH1, PTEN, RAD51C, RAD51D, RB1, RECQL, RET, SDHA, SDHAF2, SDHB, SDHC, SDHD, SMAD4, SMARCA4, SMARCB1, SMARCE1, STK11, SUFU, TMEM127, TP53, TSC1, TSC2, VHL and XRCC2 (sequencing and deletion/duplication); EGFR, EGLN1, HOXB13, KIT, MITF, PDGFRA, POLD1, and POLE (sequencing only); EPCAM and GREM1 (deletion/duplication only).    05/11/2021 - 09/28/2021 Chemotherapy   Patient is on Treatment Plan : COLORECTAL Pembrolizumab (200) q21d     11/24/2021 - 05/03/2022 Chemotherapy   Patient is on Treatment Plan : COLORECTAL FOLFOX q14d x 4 months     02/20/2022 Imaging    IMPRESSION: 1. Distal stomach/duodenal mass is decreased in size when compared with prior exam. 2. Stable upper abdominal  adenopathy. 3. 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 4. Aortic Atherosclerosis (ICD10-I70.0).   05/15/2022 Imaging    IMPRESSION: Improved masslike enlargement of the duodenal as well as fold nodularity and thickening along the stomach. In addition there are some small nodes identified in the upper abdomen which are similar overall compared to the study of December 2023   However there is a new spiculated nodule in the middle lobe which is worrisome. In addition there are some small low-attenuation liver lesions which are too small to completely characterize but were not clearly seen on the previous examination. Recommend further evaluation. For the liver lesions in particular a Eovist liver MRI may be of some benefit as clinically directed.   Persistent cystic lesion along the midbody of the pancreas with associated duct dilatation. A cystic neoplasm of the pancreas is possible.   06/06/2022 Imaging   MR Abdomen W Wo Contrast     IMPRESSION: 1. No definite evidence of hepatic metastatic disease. Scattered punctate foci of T2 hyperintensity throughout the liver are not associated with any suspicious enhancement or restricted diffusion and are favored to reflect small cysts or biliary hamartomas. Some of these are suboptimally characterized based on their small size, and given nonvisualization on older prior studies, attention on follow-up CT in 3-6 months recommended. 2. Unchanged residual irregular thickening of the walls of the distal stomach and proximal duodenum. 3. 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 This lesion is unchanged in size from recent CT, although has slowly enlarged from older prior examinations. Recommend follow-up abdominal MRI in 1 year. 4. Stable mild  extrahepatic biliary dilatation status post cholecystectomy. 5. Aortic and branch vessel atherosclerosis, better seen on CT.   09/07/2022 -  Chemotherapy   Patient is on Treatment Plan : COLORECTAL FOLFIRI q14d        Discussed the use of AI scribe software for clinical note transcription with the patient, who gave verbal consent to proceed.  History of Present Illness   The patient, a 77 year old with a history of duodenal cancer, presents for follow-up. He reports a recent episode of diarrhea and loss of appetite, which lasted longer than previous episodes. The patient also experienced a sensation of feeling like he was catching a cold, but this resolved the day prior to the appointment. He denies fever, muscle aches, sore throat, and chills. The patient's weight is slightly lower than previous, but he reports an improvement in appetite. He also reports needing to take multiple doses of Imodium for the diarrhea, which was not typical for him.         All other systems were reviewed with the patient and are negative.  MEDICAL HISTORY:  Past Medical History:  Diagnosis Date   Anemia 10/31/2021   Dx IDA   Carotid stenosis, left    Colon cancer (HCC) 2000   Coronary artery disease    per Dr. 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,  volume 11.5 ml   Stroke (HCC) 04/18/2021   TIA (transient ischemic attack)     SURGICAL HISTORY: Past Surgical History:  Procedure Laterality Date   CHOLECYSTECTOMY N/A 10/19/2021   Procedure: CHOLECYSTECTOMY;  Surgeon: Almond Lint, MD;  Location: [facility] OR;  Service: General;   Laterality: N/A;   COLON SURGERY  2000   colon cancer   COLONOSCOPY     ENDARTERECTOMY Left 04/25/2021   Procedure: LEFT ENDARTERECTOMY CAROTID;  Surgeon: Cephus Shelling, MD;  [facility] OR;  Service: Vascular;  Laterality: Left;   EUS     pancreatic cyst   KNEE ARTHROSCOPY  2007   LT- [facility]   LAPAROSCOPY N/A 10/19/2021   Procedure: LAPAROSCOPY DIAGNOSTIC;  Surgeon: Almond Lint, MD;  Location: [facility] OR;  Service: General;  Laterality: N/A;  GENERAL AND TAP BLOCK   PATCH ANGIOPLASTY Left 04/25/2021   Procedure: PATCH ANGIOPLASTY XENOSURE 1CM X 6CM;  Surgeon: Cephus Shelling, MD;  Location: [facility] OR;  Service: Vascular;  Laterality: Left;   PORTACATH PLACEMENT Left 11/17/2021   Procedure: PORT PLACEMENT;  Surgeon: Almond Lint, MD;  Location: [facility] OR;  Service: General;  Laterality: Left;   PROSTATE BIOPSY  05/23/13   gleason 7, volume 11.5 ml   WHIPPLE PROCEDURE N/A 10/19/2021   Procedure: ATTEMPTED WHIPPLE PROCEDURE  WITH GASTRODUODENAL BYPASS;  Surgeon: Almond Lint, MD;  Location: [facility] OR;  Service: General;  Laterality: N/A;  GENERAL AND TAP BLOCK    I have reviewed the social history and family history with the patient and they are unchanged from previous note.  ALLERGIES:  is allergic to lyrica [pregabalin] and ciprofloxacin.  MEDICATIONS:  Current Outpatient Medications  Medication Sig Dispense Refill   diphenoxylate-atropine (LOMOTIL) 2.5-0.025 MG tablet Take 1-2 tablets by mouth 4 (four) times daily as needed for diarrhea or loose stools. 20 tablet 0   amLODipine (NORVASC) 10 MG tablet TAKE 1 TABLET BY MOUTH ONCE DAILY 90 tablet 3   aspirin EC 81 MG EC tablet Take 1 tablet (81 mg total) by mouth daily. Swallow whole. 30 tablet 11   atorvastatin (LIPITOR) 40 MG tablet Take 1 tablet by mouth once daily 90 tablet 2   b complex vitamins capsule Take 1 capsule by mouth daily.     benazepril (LOTENSIN) 40 MG tablet Take 1 tablet by mouth once daily 90 tablet 2   carvedilol  (COREG) 12.5 MG tablet TAKE 1 TABLET BY MOUTH 2 TIMES DAILY. 180 tablet 3   cyanocobalamin (VITAMIN B12) 500 MCG tablet Take 500 mcg by mouth daily.     DULoxetine (CYMBALTA) 30 MG capsule Take 1 capsule (30 mg total) by mouth daily. 55 capsule 1   iron polysaccharides (NIFEREX) 150 MG capsule TAKE 1 CAPSULE BY MOUTH TWICE A DAY 60 capsule 2   potassium chloride SA (KLOR-CON M20) 20 MEQ tablet Take 1 tablet (20 mEq total) by mouth 3 (three) times daily. 90 tablet 2   prochlorperazine (COMPAZINE) 10 MG tablet Take 1 tablet (10 mg total) by mouth every 6 (six) hours as needed for nausea or vomiting (Use for nausea and / or vomiting unresolved with ondansetron (Zofran).). 30 tablet 0   traMADol (ULTRAM) 50 MG tablet TAKE 1 TABLET BY MOUTH EVERY 12 HOURS AS NEEDED 60 tablet 2   Travoprost, BAK Free, (TRAVATAN) 0.004 % SOLN ophthalmic solution Place 1 drop into both eyes at bedtime.     VITAMIN D PO Take 2 tablets by mouth daily. Gummies  No current facility-administered medications for this visit.   Facility-Administered Medications Ordered in Other Visits  Medication Dose Route Frequency Provider Last Rate Last Admin   sodium chloride flush (NS) 0.9 % injection 10 mL  10 mL Intracatheter PRN Malachy Mood, MD   10 mL at 03/01/23 1525    PHYSICAL EXAMINATION: ECOG PERFORMANCE STATUS: 2  Vitals:   03/01/23 1158  BP: (!) 151/74  Pulse: 82  Resp: 15  Temp: 97.8 F (36.6 C)   Wt Readings from Last 3 Encounters:  03/01/23 217 lb 3.2 oz (98.5 kg)  02/08/23 219 lb 3.2 oz (99.4 kg)  01/25/23 222 lb 1.6 oz (100.7 kg)     GENERAL:alert, no distress and comfortable SKIN: skin color, texture, turgor are normal, no rashes or significant lesions EYES: normal, Conjunctiva are pink and non-injected, sclera clear NECK: supple, thyroid normal size, non-tender, without nodularity LYMPH:  no palpable lymphadenopathy in the cervical, axillary  LUNGS: clear to auscultation and percussion with normal  breathing effort HEART: regular rate & rhythm and no murmurs and no lower extremity edema ABDOMEN:abdomen soft, non-tender and normal bowel sounds Musculoskeletal:no cyanosis of digits and no clubbing  NEURO: alert & oriented x 3 with fluent speech, no focal motor/sensory deficits    LABORATORY DATA:  I have reviewed the data as listed    Latest Ref Rng & Units 03/01/2023   11:37 AM 02/08/2023   10:28 AM 01/25/2023    8:47 AM  CBC  WBC 4.0 - 10.5 K/uL 8.7  4.0  2.8   Hemoglobin 13.0 - 17.0 g/dL 16.1  09.6  04.5   Hematocrit 39.0 - 52.0 % 34.7  37.7  39.9   Platelets 150 - 400 K/uL 282  180  193         Latest Ref Rng & Units 03/01/2023   11:37 AM 02/08/2023   10:28 AM 01/25/2023    8:47 AM  CMP  Glucose 70 - 99 mg/dL 409  811  914   BUN 8 - 23 mg/dL 18  13  17    Creatinine 0.61 - 1.24 mg/dL 7.82  9.56  2.13   Sodium 135 - 145 mmol/L 142  143  143   Potassium 3.5 - 5.1 mmol/L 4.0  4.0  3.8   Chloride 98 - 111 mmol/L 109  111  109   CO2 22 - 32 mmol/L 29  28  29    Calcium 8.9 - 10.3 mg/dL 8.9  8.7  8.9   Total Protein 6.5 - 8.1 g/dL 6.3  6.1  6.4   Total Bilirubin <1.2 mg/dL 0.7  0.7  0.9   Alkaline Phos 38 - 126 U/L 82  78  106   AST 15 - 41 U/L 19  18  16    ALT 0 - 44 U/L 28  24  15        RADIOGRAPHIC STUDIES: I have personally reviewed the radiological images as listed and agreed with the findings in the report. No results found.    Orders Placed This Encounter  Procedures   CT CHEST ABDOMEN PELVIS W CONTRAST    Standing Status:   Future    Expected Date:   03/29/2023    Expiration Date:   02/29/2024    If indicated for the ordered procedure, I authorize the administration of contrast media per Radiology protocol:   Yes    Does the patient have a contrast media/X-ray dye allergy?:   No    Preferred imaging location?:  [facility]    If indicated for the ordered procedure, I authorize the administration of oral contrast media per Radiology protocol:    Yes   All questions were answered. The patient knows to call the clinic with any problems, questions or concerns. No barriers to learning was detected. The total time spent in the appointment was 30 minutes.     Malachy Mood, MD 03/01/2023

## 2023-03-01 NOTE — Assessment & Plan Note (Signed)
 diagnosed 02/2021 by endo-/colonoscopy for weight loss and anemia. Treatment delayed due to his stroke on 04/18/21. S/p 8 cycles neoadjuvant Keytruda 05/11/21 - 09/28/21. He tolerated well with mild fatigue.  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 -He began FOLFOX on 11/24/21. He has tolerated well overall with some taste changes and mild fatigue -restaging CT scan 02/20/2022 showed PR -restaging CT from 05/15/2022 showed improved masslike enlargement of the duodenal as well as fold nodularity and thickening along the stomach, however it showed a new liver lesion, indeterminate, will obtain liver MRI  -Patient complained of worsening fatigue, last cycle chemo was held on 05/18/22. -Liver MRI in March 2024 showed benign liver lesions and a cystic lesion in pancreas, no evidence of liver metastasis.   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  No evidence of distant metastasis. -Due to the cancer progression, and his persistent peripheral neuropathy, I recommend change treatment to second line FOLFIRI, he started on 09/07/22, he is tolerating well so far  -Due to his moderate fatigue, I reduced irinotecan dose slightly from C3 -His performance status has been low due to significant fatigue, required chemo dose reduction and chemo break  --Restaging CT scan from December 04, 2022 showed excellent response, the primary duodenal mass is not really measurable, no other signs of metastasis. -I changed his treatment to maintenance irrinotecan alone on  12/21/2022. He is tolerating better

## 2023-03-08 ENCOUNTER — Ambulatory Visit: Payer: Medicare Other

## 2023-03-08 ENCOUNTER — Other Ambulatory Visit: Payer: Medicare Other

## 2023-03-08 ENCOUNTER — Ambulatory Visit: Payer: Medicare Other | Admitting: Nurse Practitioner

## 2023-03-21 NOTE — Assessment & Plan Note (Signed)
 diagnosed 02/2021 by endo-/colonoscopy for weight loss and anemia. Treatment delayed due to his stroke on 04/18/21. S/p 8 cycles neoadjuvant Keytruda 05/11/21 - 09/28/21. He tolerated well with mild fatigue.  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 -He began FOLFOX on 11/24/21. He has tolerated well overall with some taste changes and mild fatigue -restaging CT scan 02/20/2022 showed PR -restaging CT from 05/15/2022 showed improved masslike enlargement of the duodenal as well as fold nodularity and thickening along the stomach, however it showed a new liver lesion, indeterminate, will obtain liver MRI  -Patient complained of worsening fatigue, last cycle chemo was held on 05/18/22. -Liver MRI in March 2024 showed benign liver lesions and a cystic lesion in pancreas, no evidence of liver metastasis.   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  No evidence of distant metastasis. -Due to the cancer progression, and his persistent peripheral neuropathy, I recommend change treatment to second line FOLFIRI, he started on 09/07/22, he is tolerating well so far  -Due to his moderate fatigue, I reduced irinotecan dose slightly from C3 -His performance status has been low due to significant fatigue, required chemo dose reduction and chemo break  --Restaging CT scan from December 04, 2022 showed excellent response, the primary duodenal mass is not really measurable, no other signs of metastasis. -I changed his treatment to maintenance irrinotecan alone on  12/21/2022. He is tolerating better

## 2023-03-22 ENCOUNTER — Inpatient Hospital Stay: Payer: Medicare Other

## 2023-03-22 ENCOUNTER — Encounter: Payer: Self-pay | Admitting: Nurse Practitioner

## 2023-03-22 ENCOUNTER — Inpatient Hospital Stay: Payer: Medicare Other | Admitting: Nurse Practitioner

## 2023-03-22 ENCOUNTER — Inpatient Hospital Stay: Payer: Medicare Other | Attending: Nurse Practitioner

## 2023-03-22 VITALS — BP 147/58 | HR 79 | Temp 97.6°F | Resp 18 | Ht 66.0 in | Wt 211.4 lb

## 2023-03-22 DIAGNOSIS — C170 Malignant neoplasm of duodenum: Secondary | ICD-10-CM | POA: Insufficient documentation

## 2023-03-22 DIAGNOSIS — Z79899 Other long term (current) drug therapy: Secondary | ICD-10-CM | POA: Insufficient documentation

## 2023-03-22 DIAGNOSIS — Z5189 Encounter for other specified aftercare: Secondary | ICD-10-CM | POA: Insufficient documentation

## 2023-03-22 DIAGNOSIS — Z95828 Presence of other vascular implants and grafts: Secondary | ICD-10-CM

## 2023-03-22 DIAGNOSIS — D500 Iron deficiency anemia secondary to blood loss (chronic): Secondary | ICD-10-CM

## 2023-03-22 DIAGNOSIS — Z5111 Encounter for antineoplastic chemotherapy: Secondary | ICD-10-CM | POA: Diagnosis not present

## 2023-03-22 DIAGNOSIS — Z7982 Long term (current) use of aspirin: Secondary | ICD-10-CM | POA: Diagnosis not present

## 2023-03-22 DIAGNOSIS — D508 Other iron deficiency anemias: Secondary | ICD-10-CM | POA: Insufficient documentation

## 2023-03-22 LAB — CMP (CANCER CENTER ONLY)
ALT: 21 U/L (ref 0–44)
AST: 20 U/L (ref 15–41)
Albumin: 3.3 g/dL — ABNORMAL LOW (ref 3.5–5.0)
Alkaline Phosphatase: 104 U/L (ref 38–126)
Anion gap: 4 — ABNORMAL LOW (ref 5–15)
BUN: 15 mg/dL (ref 8–23)
CO2: 30 mmol/L (ref 22–32)
Calcium: 9.1 mg/dL (ref 8.9–10.3)
Chloride: 109 mmol/L (ref 98–111)
Creatinine: 0.93 mg/dL (ref 0.61–1.24)
GFR, Estimated: >60 mL/min (ref >60)
Glucose, Bld: 136 mg/dL — ABNORMAL HIGH (ref 70–99)
Potassium: 3.8 mmol/L (ref 3.5–5.1)
Sodium: 143 mmol/L (ref 135–145)
Total Bilirubin: 0.8 mg/dL (ref 0.0–1.2)
Total Protein: 6.7 g/dL (ref 6.5–8.1)

## 2023-03-22 LAB — CBC WITH DIFFERENTIAL (CANCER CENTER ONLY)
Abs Immature Granulocytes: 0.03 10*3/uL (ref 0.00–0.07)
Basophils Absolute: 0 10*3/uL (ref 0.0–0.1)
Basophils Relative: 0 %
Eosinophils Absolute: 0.3 10*3/uL (ref 0.0–0.5)
Eosinophils Relative: 4 %
HCT: 34.3 % — ABNORMAL LOW (ref 39.0–52.0)
Hemoglobin: 11.3 g/dL — ABNORMAL LOW (ref 13.0–17.0)
Immature Granulocytes: 0 %
Lymphocytes Relative: 15 %
Lymphs Abs: 1.1 10*3/uL (ref 0.7–4.0)
MCH: 26.7 pg (ref 26.0–34.0)
MCHC: 32.9 g/dL (ref 30.0–36.0)
MCV: 80.9 fL (ref 80.0–100.0)
Monocytes Absolute: 0.7 10*3/uL (ref 0.1–1.0)
Monocytes Relative: 10 %
Neutro Abs: 5.3 10*3/uL (ref 1.7–7.7)
Neutrophils Relative %: 71 %
Platelet Count: 211 10*3/uL (ref 150–400)
RBC: 4.24 MIL/uL (ref 4.22–5.81)
RDW: 17.8 % — ABNORMAL HIGH (ref 11.5–15.5)
WBC Count: 7.5 10*3/uL (ref 4.0–10.5)
nRBC: 0 % (ref 0.0–0.2)

## 2023-03-22 MED ORDER — DEXAMETHASONE SODIUM PHOSPHATE 10 MG/ML IJ SOLN
10.0000 mg | Freq: Once | INTRAMUSCULAR | Status: AC
  Administered 2023-03-22: 10 mg via INTRAVENOUS
  Filled 2023-03-22: qty 1

## 2023-03-22 MED ORDER — SODIUM CHLORIDE 0.9% FLUSH
10.0000 mL | INTRAVENOUS | Status: DC | PRN
  Administered 2023-03-22: 10 mL

## 2023-03-22 MED ORDER — HEPARIN SOD (PORK) LOCK FLUSH 100 UNIT/ML IV SOLN
500.0000 [IU] | Freq: Once | INTRAVENOUS | Status: AC | PRN
  Administered 2023-03-22: 500 [IU]

## 2023-03-22 MED ORDER — DEXAMETHASONE 4 MG PO TABS: 4.0000 mg | ORAL_TABLET | Freq: Every day | ORAL | 0 refills | Status: DC

## 2023-03-22 MED ORDER — SODIUM CHLORIDE 0.9 % IV SOLN
100.0000 mg/m2 | Freq: Once | INTRAVENOUS | Status: AC
  Administered 2023-03-22: 200 mg via INTRAVENOUS
  Filled 2023-03-22: qty 10

## 2023-03-22 MED ORDER — SODIUM CHLORIDE 0.9 % IV SOLN: Freq: Once | INTRAVENOUS | Status: AC

## 2023-03-22 MED ORDER — PALONOSETRON HCL INJECTION 0.25 MG/5ML
0.2500 mg | Freq: Once | INTRAVENOUS | Status: AC
  Administered 2023-03-22: 0.25 mg via INTRAVENOUS
  Filled 2023-03-22: qty 5

## 2023-03-22 MED ORDER — POLYSACCHARIDE IRON COMPLEX 150 MG PO CAPS: 150.0000 mg | ORAL_CAPSULE | Freq: Two times a day (BID) | ORAL | 2 refills | Status: DC

## 2023-03-22 MED ORDER — ATROPINE SULFATE 1 MG/ML IV SOLN
0.5000 mg | Freq: Once | INTRAVENOUS | Status: AC | PRN
  Administered 2023-03-22: 0.5 mg via INTRAVENOUS
  Filled 2023-03-22: qty 1

## 2023-03-22 MED ORDER — SODIUM CHLORIDE 0.9% FLUSH
10.0000 mL | Freq: Once | INTRAVENOUS | Status: AC
  Administered 2023-03-22: 10 mL

## 2023-03-22 NOTE — Patient Instructions (Signed)
 [facility] - A DEPT OF [facility]  Discharge Instructions: Thank you for choosing [facility] to provide your oncology and hematology care.   If you have a lab appointment with the Cancer Center, please go directly to the Cancer Center and check in at the registration area.   Wear comfortable clothing and clothing appropriate for easy access to any Portacath or PICC line.   We strive to give you quality time with your provider. You may need to reschedule your appointment if you arrive late (15 or more minutes).  Arriving late affects you and other patients whose appointments are after yours.  Also, if you miss three or more appointments without notifying the office, you may be dismissed from the clinic at the provider's discretion.      For prescription refill requests, have your pharmacy contact our office and allow 72 hours for refills to be completed.    Today you received the following chemotherapy and/or immunotherapy agents irinotecan        To help prevent nausea and vomiting after your treatment, we encourage you to take your nausea medication as directed.  BELOW ARE SYMPTOMS THAT SHOULD BE REPORTED IMMEDIATELY: *FEVER GREATER THAN 100.4 F (38 C) OR HIGHER *CHILLS OR SWEATING *NAUSEA AND VOMITING THAT IS NOT CONTROLLED WITH YOUR NAUSEA MEDICATION *UNUSUAL SHORTNESS OF BREATH *UNUSUAL BRUISING OR BLEEDING *URINARY PROBLEMS (pain or burning when urinating, or frequent urination) *BOWEL PROBLEMS (unusual diarrhea, constipation, pain near the anus) TENDERNESS IN MOUTH AND THROAT WITH OR WITHOUT PRESENCE OF ULCERS (sore throat, sores in mouth, or a toothache) UNUSUAL RASH, SWELLING OR PAIN  UNUSUAL VAGINAL DISCHARGE OR ITCHING   Items with * indicate a potential emergency and should be followed up as soon as possible or go to the Emergency Department if any problems should occur.  Please show the CHEMOTHERAPY ALERT CARD or IMMUNOTHERAPY  ALERT CARD at check-in to the Emergency Department and triage nurse.  Should you have questions after your visit or need to cancel or reschedule your appointment, please contact [facility] - A DEPT OF [facility]  Dept: [phone]  and follow the prompts.  Office hours are 8:00 a.m. to 4:30 p.m. Monday - Friday. Please note that voicemails left after 4:00 p.m. may not be returned until the following business day.  We are closed weekends and major holidays. You have access to a nurse at all times for urgent questions. Please call the main number to the clinic Dept: [phone] and follow the prompts.   For any non-urgent questions, you may also contact your provider using MyChart. We now offer e-Visits for anyone 21 and older to request care online for non-urgent symptoms. For details visit [email or web address].   Also download the MyChart app! Go to the app store, search MyChart, open the app, select [place], and log in with your MyChart username and password.

## 2023-03-22 NOTE — Progress Notes (Signed)
 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 Simple appearing right renal cyst. 2. Otherwise negative abdominal ultrasound   03/18/2021 Procedure   Upper Endoscopy/Colonoscopy, Dr. Legrand  Upper Endoscopy Impression: - Normal esophagus. - A single submucosal papule (nodule) found in the stomach. - Duodenal mass. Biopsied. Concerning for malignancy.  Colonoscopy Impression: - Diverticulosis in the entire examined colon. - The examined portion of the ileum was normal. - Patent end-to-end colo-colonic anastomosis, characterized by healthy appearing mucosa. - Internal hemorrhoids. - No specimens collected.   03/18/2021 Initial Biopsy   Diagnosis Duodenum, Biopsy - INVASIVE MODERATE TO POORLY DIFFERENTIATED ADENOCARCINOMA   03/18/2021 Cancer Staging   Staging form: Small Intestine - Adenocarcinoma, AJCC 8th Edition - Clinical stage from 03/18/2021: Stage IIIA (cTX, cN1, cM0) - Signed by Lanny Callander, MD on 06/22/2021 Stage prefix: Initial diagnosis   03/22/2021 Initial Diagnosis   Adenocarcinoma of duodenum (HCC)   04/01/2021 Imaging   EXAM: CT CHEST, ABDOMEN, AND PELVIS WITH CONTRAST  IMPRESSION: 1. Circumferential wall thickening of the first  portion of the duodenum, in keeping with known primary malignancy. 2. Enlarged, hypodense lymph nodes anterior to the pancreatic head and in the portacaval station, concerning for nodal metastatic disease. 3. No other evidence of metastatic disease in the chest, abdomen, or pelvis. 4. Incidental note of a fluid attenuation lesion within the proximal pancreatic body measuring 1.9 x 1.4 cm, with prominence of the pancreatic duct distally, up to 0.6 cm. This lesion was present on remote prior examination dated 05/04/2009, and has slightly increased in size over a long period of time. This is consistent with a small IPMN and benign given indolent behavior over greater than 10 years. 5. Background of very fine centrilobular pulmonary nodules, most concentrated in the lung apices, most commonly seen in smoking-related respiratory bronchiolitis. 6. Coronary artery disease.   Aortic Atherosclerosis (ICD10-I70.0).    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 Of note, a variant of uncertain significance was detected in the ATM (p.A869G), BLM (c.800-3T>G), and SDHA (p.V632F) genes. Report date is 04/27/2021.  The CancerNext-Expanded gene panel offered by [facility] and includes sequencing, rearrangement, and RNA analysis for the following 77 genes: AIP, ALK, APC, ATM, AXIN2, BAP1, BARD1, BLM, BMPR1A, BRCA1, BRCA2, BRIP1, CDC73, CDH1, CDK4, CDKN1B, CDKN2A, CHEK2, CTNNA1, DICER1, FANCC, FH, FLCN, GALNT12, KIF1B, LZTR1, MAX, MEN1, MET, MLH1, MSH2, MSH3, MSH6, MUTYH, NBN, NF1, NF2, NTHL1, PALB2, PHOX2B, PMS2, POT1, PRKAR1A, PTCH1, PTEN, RAD51C, RAD51D, RB1, RECQL, RET, SDHA, SDHAF2, SDHB, SDHC, SDHD, SMAD4, SMARCA4, SMARCB1, SMARCE1, STK11, SUFU, TMEM127, TP53, TSC1, TSC2, VHL and XRCC2 (sequencing and deletion/duplication); EGFR, EGLN1, HOXB13, KIT, MITF, PDGFRA, POLD1, and POLE (sequencing only);  EPCAM and GREM1 (deletion/duplication only).    05/11/2021 - 09/28/2021 Chemotherapy  Patient is on Treatment Plan : COLORECTAL Pembrolizumab  (200) q21d     11/24/2021 - 05/03/2022 Chemotherapy   Patient is on Treatment Plan : COLORECTAL FOLFOX q14d x 4 months     02/20/2022 Imaging    IMPRESSION: 1. Distal stomach/duodenal mass is decreased in size when compared with prior exam. 2. Stable upper abdominal adenopathy. 3. 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 4. Aortic Atherosclerosis (ICD10-I70.0).   05/15/2022 Imaging    IMPRESSION: Improved masslike enlargement of the duodenal as well as fold nodularity and thickening along the stomach. In addition there are some small nodes identified in the upper abdomen which are similar overall compared to the study of December 2023   However there is a new spiculated nodule in the middle lobe which is worrisome. In addition there are some small low-attenuation liver lesions which are too small to completely characterize but were not clearly seen on the previous examination. Recommend further evaluation. For the liver lesions in particular a Eovist  liver MRI may be of some benefit as clinically directed.   Persistent cystic lesion along the midbody of the pancreas with associated duct dilatation. A cystic neoplasm of the pancreas is possible.   06/06/2022 Imaging   MR Abdomen W Wo Contrast     IMPRESSION: 1. No definite evidence of hepatic metastatic disease. Scattered punctate foci of T2 hyperintensity throughout the liver are not associated with any suspicious enhancement or restricted diffusion and are favored to reflect small cysts or biliary hamartomas. Some of these are suboptimally characterized based on their small size, and given nonvisualization on older prior studies, attention on follow-up CT in 3-6  months recommended. 2. Unchanged residual irregular thickening of the walls of the distal stomach and proximal duodenum. 3. 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 This lesion is unchanged in size from recent CT, although has slowly enlarged from older prior examinations. Recommend follow-up abdominal MRI in 1 year. 4. Stable mild extrahepatic biliary dilatation status post cholecystectomy. 5. Aortic and branch vessel atherosclerosis, better seen on CT.   09/07/2022 -  Chemotherapy   Patient is on Treatment Plan : COLORECTAL FOLFIRI q14d        CURRENT THERAPY: FOLFIRI S/p 7 cycles followed by maintenance Irinotecan  q2 weeks  INTERVAL HISTORY Mr. Hank returns for follow-up and treatment as scheduled, last seen by Dr. Lanny 03/01/2023 with cycle 4 maintenance irinotecan  which was slightly dose reduced for prolonged recovery and upcoming holiday.  He had cramping gas pain which affected his appetite, he lost 6 pounds. Able to adjust home meds and this has helped some.  He has periodic diarrhea, Imodium is effective.  He is able to remain hydrated.  Denies any other new or specific complaints.  ROS  All other systems reviewed and negative  Past Medical History:  Diagnosis Date   Anemia 10/31/2021   Dx IDA   Carotid stenosis, left    Colon cancer (HCC) 2000   Coronary artery disease    per Dr. 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   gleason 3+4=7, volume 11.5 ml   Stroke (HCC) 04/18/2021   TIA (transient ischemic attack)      Past Surgical History:   Procedure Laterality Date   CHOLECYSTECTOMY N/A 10/19/2021   Procedure: CHOLECYSTECTOMY;  Surgeon: Aron Shoulders, MD;  Location: [facility] OR;  Service: General;  Laterality: N/A;   COLON SURGERY  2000   colon cancer   COLONOSCOPY     ENDARTERECTOMY Left 04/25/2021   Procedure: LEFT ENDARTERECTOMY CAROTID;  Surgeon: Gretta Lonni PARAS, MD;  Location[facility] OR;  Service: Vascular;  Laterality: Left;   EUS     pancreatic cyst   KNEE ARTHROSCOPY  2007   LT- [facility]   LAPAROSCOPY N/A 10/19/2021   Procedure: LAPAROSCOPY DIAGNOSTIC;  Surgeon: Aron Shoulders, MD;  Location: [facility] OR;  Service: General;  Laterality: N/A;  GENERAL AND TAP BLOCK   PATCH ANGIOPLASTY Left 04/25/2021   Procedure: PATCH ANGIOPLASTY XENOSURE 1CM X 6CM;  Surgeon: Gretta Lonni PARAS, MD;  Location: [place] OR;  Service: Vascular;  Laterality: Left;   PORTACATH PLACEMENT Left 11/17/2021   Procedure: PORT PLACEMENT;  Surgeon: Aron Shoulders, MD;  Location: [facility] OR;  Service: General;  Laterality: Left;   PROSTATE BIOPSY  05/23/13   gleason 7, volume 11.5 ml   WHIPPLE PROCEDURE N/A 10/19/2021   Procedure: ATTEMPTED WHIPPLE PROCEDURE  WITH GASTRODUODENAL BYPASS;  Surgeon: Aron Shoulders, MD;  Location: [facility] OR;  Service: General;  Laterality: N/A;  GENERAL AND TAP BLOCK     Outpatient Encounter Medications as of 03/22/2023  Medication Sig   amLODipine  (NORVASC ) 10 MG tablet TAKE 1 TABLET BY MOUTH ONCE DAILY   aspirin  EC 81 MG EC tablet Take 1 tablet (81 mg total) by mouth daily. Swallow whole.   atorvastatin  (LIPITOR) 40 MG tablet Take 1 tablet by mouth once daily   b complex vitamins capsule Take 1 capsule by mouth daily.   benazepril  (LOTENSIN ) 40 MG tablet Take 1 tablet by mouth once daily   carvedilol  (COREG ) 12.5 MG tablet TAKE 1 TABLET BY MOUTH 2 TIMES DAILY.   cyanocobalamin  (VITAMIN B12) 500 MCG tablet Take 500 mcg by mouth daily.   dexamethasone  (DECADRON ) 4 MG tablet Take 1 tablet (4 mg total) by mouth daily. Starting the day after chemo,  take for 3 to 5 days   diphenoxylate -atropine  (LOMOTIL ) 2.5-0.025 MG tablet Take 1-2 tablets by mouth 4 (four) times daily as needed for diarrhea or loose stools.   DULoxetine  (CYMBALTA ) 30 MG capsule Take 1 capsule (30 mg total) by mouth daily.   potassium chloride  SA (KLOR-CON  M20) 20 MEQ tablet Take 1 tablet (20 mEq total) by mouth 3 (three) times daily.   prochlorperazine  (COMPAZINE ) 10 MG tablet Take 1 tablet (10 mg total) by mouth every 6 (six) hours as needed for nausea or vomiting (Use for nausea and / or vomiting unresolved with ondansetron  (Zofran ).).   traMADol  (ULTRAM ) 50 MG tablet TAKE 1 TABLET BY MOUTH EVERY 12 HOURS AS NEEDED   Travoprost, BAK Free, (TRAVATAN) 0.004 % SOLN ophthalmic solution Place 1 drop into both eyes at bedtime.   VITAMIN D  PO Take 2 tablets by mouth daily. Gummies   [DISCONTINUED] iron  polysaccharides (NIFEREX) 150 MG capsule TAKE 1 CAPSULE BY MOUTH TWICE A DAY   iron  polysaccharides (NIFEREX) 150 MG capsule Take 1 capsule (150 mg total) by mouth 2 (two) times daily.   No facility-administered encounter medications on file  as of 03/22/2023.     Today's Vitals   03/22/23 1219  BP: (!) 147/58  Pulse: 79  Resp: 18  Temp: 97.6 F (36.4 C)  TempSrc: Temporal  SpO2: 100%  Weight: 211 lb 6.4 oz (95.9 kg)  Height: 5' 6 (1.676 m)  PainSc: 0-No pain   Body mass index is 34.12 kg/m.   PHYSICAL EXAM GENERAL:alert, no distress and comfortable SKIN: no rash. Soft tissue nodule at the lateral left forearm/elbow ?lipoma EYES: sclera clear NECK: without mass LYMPH:  no palpable cervical or supraclavicular lymphadenopathy  LUNGS: clear with normal breathing effort HEART: regular rate & rhythm, no lower extremity edema ABDOMEN: abdomen soft, non-tender and normal bowel sounds NEURO: alert & oriented x 3 with fluent speech PAC without erythema    CBC    Component Value Date/Time   WBC 7.5 03/22/2023 1142   WBC 8.5 10/31/2021 1211   RBC 4.24 03/22/2023  1142   HGB 11.3 (L) 03/22/2023 1142   HCT 34.3 (L) 03/22/2023 1142   PLT 211 03/22/2023 1142   MCV 80.9 03/22/2023 1142   MCH 26.7 03/22/2023 1142   MCHC 32.9 03/22/2023 1142   RDW 17.8 (H) 03/22/2023 1142   LYMPHSABS 1.1 03/22/2023 1142   MONOABS 0.7 03/22/2023 1142   EOSABS 0.3 03/22/2023 1142   BASOSABS 0.0 03/22/2023 1142     CMP     Component Value Date/Time   NA 143 03/22/2023 1142   K 3.8 03/22/2023 1142   CL 109 03/22/2023 1142   CO2 30 03/22/2023 1142   GLUCOSE 136 (H) 03/22/2023 1142   BUN 15 03/22/2023 1142   CREATININE 0.93 03/22/2023 1142   CALCIUM  9.1 03/22/2023 1142   PROT 6.7 03/22/2023 1142   ALBUMIN  3.3 (L) 03/22/2023 1142   AST 20 03/22/2023 1142   ALT 21 03/22/2023 1142   ALKPHOS 104 03/22/2023 1142   BILITOT 0.8 03/22/2023 1142   GFRNONAA >60 03/22/2023 1142   GFRAA >60 02/24/2019 0207     ASSESSMENT & PLAN: NEDIM OKI is a 78 y.o. 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  -Unfortunately suffered a stroke on 04/18/2021 therefore he did not proceed with upfront surgery  -S/p 8 cycles neoadjuvant Keytruda  05/11/21 - 09/28/21. He tolerated well with mild fatigue.  -attempted Whipple surgery 10/19/21, which was aborted due to obscuring of hepatic artery, s/p gastric bypass. -He began first line FOLFOX on 11/24/21, changed to maintenance Xeloda  05/2022 until he progressed in 08/2022 -Due to his existing neuropathy, he started FOLFIRI 08/2022, dose reduced with C3 for fatigue.  Restaging CT 12/04/2022 showed excellent partial response -S/p 7 cycles before proceeding with maintenance irinotecan  starting with cycle 8 on 01/11/2023  -Mr. Schirmer appears stable. S/p C4 maintenance irinotecan  with mild dose reduction. Tolerating moderately well with cramping diarrhea. SEs are adequately managed with supportive care at home -Able to recover and function with decent PS. No clinical evidence of  progression -For weight loss, I reviewed appetite stimulant. He is already on duloxetine  so mirtazapine not great option. Will avoid megace for prior CVA. 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  I recommended to start B complex vitamin for nerve health.  We discussed additional medication such as gabapentin, Lyrica, or Cymbalta  -He tried Lyrica when he had shingles, made him feel bad, but is open to gabapentin or Cymbalta  if symptoms progress -Encouraged him to use other nonpharmacologic remedies such as heat, massage, and hand exercises/as -Stable   PMS2-related Lynch syndrome (HNPCC4) -he has a personal history of stage pT3N0 colon cancer in 2000 and prostate cancer dx in 2015, s/p 8 weeks of IMRT with Dr. Jason. -genetic testing and counseling on 04/07/21 confirmed a PMS2 mutation, as well as VUS in ATM, BLM, and SDHA.    PLAN: -Labs reviewed -Proceed with maintenance C5 Irinotecan , dose reduced to 100 mg/m2 for cramping/diarrhea and weight loss -Rx: Dex 4 mg daily x3 - 5 days after chemo for fatigue and appetite -Restaging CT CAP 1/9 as scheduled -F/up and next cycle in 2 weeks  Orders Placed This Encounter  Procedures   CBC with Differential (Cancer Center Only)    Standing Status:   Future    Expected Date:   05/03/2023    Expiration Date:   05/02/2024   CMP (Cancer Center only)    Standing Status:   Future    Expected Date:   05/03/2023     Expiration Date:   05/02/2024   CBC with Differential (Cancer Center Only)    Standing Status:   Future    Expected Date:   05/17/2023    Expiration Date:   05/16/2024   CMP (Cancer Center only)    Standing Status:   Future    Expected Date:   05/17/2023    Expiration Date:   05/16/2024   CBC with Differential (Cancer Center Only)    Standing Status:   Future    Expected Date:   05/31/2023    Expiration Date:   05/30/2024   CMP (Cancer Center only)    Standing Status:   Future    Expected Date:   05/31/2023    Expiration Date:   05/30/2024      All questions were answered. The patient knows to call the clinic with any problems, questions or concerns. No barriers to learning were detected.   Chloe Baig, NP-C 03/22/2023

## 2023-03-25 ENCOUNTER — Other Ambulatory Visit: Payer: Self-pay

## 2023-03-25 ENCOUNTER — Other Ambulatory Visit: Payer: Self-pay | Admitting: Nurse Practitioner

## 2023-03-26 NOTE — Telephone Encounter (Signed)
 DUPLICATE

## 2023-03-27 ENCOUNTER — Other Ambulatory Visit: Payer: Self-pay | Admitting: Nurse Practitioner

## 2023-03-27 NOTE — Telephone Encounter (Signed)
 duplicate

## 2023-03-29 ENCOUNTER — Ambulatory Visit (HOSPITAL_COMMUNITY): Payer: Medicare Other

## 2023-03-30 ENCOUNTER — Other Ambulatory Visit: Payer: Self-pay

## 2023-03-30 ENCOUNTER — Telehealth: Payer: Self-pay

## 2023-03-30 ENCOUNTER — Ambulatory Visit: Payer: Self-pay | Admitting: Internal Medicine

## 2023-03-30 NOTE — Telephone Encounter (Signed)
 Pt called c/o abdominal pain in the RUQ toward the back.  Pt stated the pain is sharp stabbing pain that started on 03/29/2023.  Pt rated the pain at 5/10 on the numeric pain scale.  Pt denied taking anything for her pain.  Pt denied increased urine frequency, fever, blood in urine, or dysuria.  Pt described his urine to be light yellow in color.  Pt denied n/v but stated he felt like he has gas.  Pt stated he took GasX which did not help with pain.  Pt denied abdominal swelling.  Pt was scheduled for a CT CAP on 03/29/2023 which the pt rescheduled d/t to pain.  Pt stated the CT CAP was rescheduled to 04/04/2023.  Educated pt that he should have contacted the office when the pain first started on 03/29/2023 before rescheduling his CT Scan.  Stated the CT Scan would have been able to show Dr. Lanny possibly the source of the pain.  Instructed pt to take Acetaminophen  for pain he's experiencing.  Stated this nurse will make Dr. Lanny aware of the pt's call and complaint and will f/u with any additional instructions Dr. Lanny recommends.

## 2023-03-30 NOTE — Telephone Encounter (Signed)
 Copied from CRM [id number]. Topic: Clinical - Red Word Triage >> Mar 30, 2023  8:47 AM Graeme ORN wrote: Red Word that prompted transfer to Nurse Triage: Pain in stomach making it hard for him to walk. Cancer patient  Chief Complaint:  Pain in stomach making it hard for him to walk. Cancer patient.  Symptoms: Upper Abdominal Pain , Rates pain as 5 out 10 on pain scale. Frequency:  x 2 days Pertinent Negatives: Patient denies Shortness of Breath. Disposition: [x] ED /[] Urgent Care (no appt availability in office) / [] Appointment(In office/virtual)/ []  [facility]/ [] Home Care/ [] Refused Recommended Disposition /[] [employer] [facility]/ []  Follow-up with PCP Additional Notes: Advised patient to call his Oncologist as well. Advised patient recommendation to go to ED. Patient provided recommendations Patient  and his wife verbalized understanding. Reason for Disposition  [1] Pain lasts > 10 minutes AND [2] age > 51  Black or tarry bowel movements  (Exception: Chronic-unchanged black-grey BMs AND is taking iron  pills or Pepto-Bismol.)  [1] Pain lasts > 10 minutes AND [2] history of heart disease (i.e., heart attack, bypass surgery, angina, angioplasty, CHF; not just a heart murmur)  Answer Assessment - Initial Assessment Questions 1. LOCATION: Where does it hurt?       Top Right of the abdomen 2. RADIATION: Does the pain shoot anywhere else? (e.g., chest, back)      Denies. It stays in one ara. 3. ONSET: When did the pain begin? (Minutes, hours or days ago)        X 2 day 4. SUDDEN: Gradual or sudden onset?      Sudden Onset  5. PATTERN Does the pain come and go, or is it constant?    - If it comes and goes: How long does it last? Do you have pain now?     (Note: Comes and goes means the pain is intermittent. It goes away completely between bouts.)    - If constant: Is it getting better, staying the same, or getting worse?      (Note: Constant means the pain never  goes away completely; most serious pain is constant and gets worse.)       Constant pain - the longer it goes on the worse 6. SEVERITY: How bad is the pain?  (e.g., Scale 1-10; mild, moderate, or severe)        - MODERATE (4-7): Interferes with normal activities or awakens from  Rates the sleep, abdomen tender to touch.     Rates  5 out of 10 on pain scale.  7. RECURRENT SYMPTOM: Have you ever had this type of stomach pain before? If Yes, ask: When was the last time? and What happened that time?       Yes, Dr. Norleen  8. CAUSE: What do you think is causing the stomach pain? Cancer Patient      9. RELIEVING/AGGRAVATING FACTORS: What makes it better or worse? (e.g., antacids, bending or twisting motion, bowel movement)      Aggravating factors-   when drink something it hurts more .    Chlorin- Potassium 10. OTHER SYMPTOMS: Do you have any other symptoms? (e.g., back pain, diarrhea, fever, urination pain, vomiting)       Denies all.  Protocols used: Abdominal Pain - Male-A-AH, Abdominal Pain - Upper-A-AH

## 2023-04-04 ENCOUNTER — Ambulatory Visit (HOSPITAL_COMMUNITY)
Admission: RE | Admit: 2023-04-04 | Discharge: 2023-04-04 | Disposition: A | Discharge status: Home / Self Care | Payer: Medicare Other | Source: Ambulatory Visit | Attending: Hematology | Admitting: Hematology

## 2023-04-04 DIAGNOSIS — C179 Malignant neoplasm of small intestine, unspecified: Secondary | ICD-10-CM | POA: Diagnosis not present

## 2023-04-04 DIAGNOSIS — C801 Malignant (primary) neoplasm, unspecified: Secondary | ICD-10-CM | POA: Diagnosis not present

## 2023-04-04 DIAGNOSIS — C170 Malignant neoplasm of duodenum: Secondary | ICD-10-CM | POA: Insufficient documentation

## 2023-04-04 DIAGNOSIS — R222 Localized swelling, mass and lump, trunk: Secondary | ICD-10-CM | POA: Diagnosis not present

## 2023-04-04 MED ORDER — HEPARIN SOD (PORK) LOCK FLUSH 100 UNIT/ML IV SOLN
500.0000 [IU] | Freq: Once | INTRAVENOUS | Status: AC
  Administered 2023-04-04: 500 [IU] via INTRAVENOUS

## 2023-04-04 MED ORDER — IOHEXOL 300 MG/ML  SOLN
100.0000 mL | Freq: Once | INTRAMUSCULAR | Status: AC | PRN
  Administered 2023-04-04: 100 mL via INTRAVENOUS

## 2023-04-05 ENCOUNTER — Inpatient Hospital Stay: Payer: Medicare Other | Admitting: Nurse Practitioner

## 2023-04-05 ENCOUNTER — Encounter: Payer: Self-pay | Admitting: Hematology

## 2023-04-05 ENCOUNTER — Other Ambulatory Visit: Payer: Self-pay | Admitting: *Deleted

## 2023-04-05 ENCOUNTER — Inpatient Hospital Stay: Payer: Medicare Other

## 2023-04-05 ENCOUNTER — Encounter: Payer: Self-pay | Admitting: Nurse Practitioner

## 2023-04-05 VITALS — BP 139/66 | HR 89 | Temp 98.8°F | Resp 18 | Wt 211.5 lb

## 2023-04-05 VITALS — BP 139/66 | HR 89 | Temp 98.9°F | Resp 18 | Wt 211.0 lb

## 2023-04-05 DIAGNOSIS — D500 Iron deficiency anemia secondary to blood loss (chronic): Secondary | ICD-10-CM

## 2023-04-05 DIAGNOSIS — Z79899 Other long term (current) drug therapy: Secondary | ICD-10-CM | POA: Diagnosis not present

## 2023-04-05 DIAGNOSIS — Z7982 Long term (current) use of aspirin: Secondary | ICD-10-CM | POA: Diagnosis not present

## 2023-04-05 DIAGNOSIS — Z5111 Encounter for antineoplastic chemotherapy: Secondary | ICD-10-CM | POA: Diagnosis not present

## 2023-04-05 DIAGNOSIS — C170 Malignant neoplasm of duodenum: Secondary | ICD-10-CM

## 2023-04-05 DIAGNOSIS — Z95828 Presence of other vascular implants and grafts: Secondary | ICD-10-CM

## 2023-04-05 DIAGNOSIS — D508 Other iron deficiency anemias: Secondary | ICD-10-CM | POA: Diagnosis not present

## 2023-04-05 DIAGNOSIS — Z5189 Encounter for other specified aftercare: Secondary | ICD-10-CM | POA: Diagnosis not present

## 2023-04-05 LAB — CMP (CANCER CENTER ONLY)
ALT: 54 U/L — ABNORMAL HIGH (ref 0–44)
AST: 24 U/L (ref 15–41)
Albumin: 2.9 g/dL — ABNORMAL LOW (ref 3.5–5.0)
Alkaline Phosphatase: 83 U/L (ref 38–126)
Anion gap: 6 (ref 5–15)
BUN: 12 mg/dL (ref 8–23)
CO2: 27 mmol/L (ref 22–32)
Calcium: 8.5 mg/dL — ABNORMAL LOW (ref 8.9–10.3)
Chloride: 107 mmol/L (ref 98–111)
Creatinine: 0.86 mg/dL (ref 0.61–1.24)
GFR, Estimated: >60 mL/min (ref >60)
Glucose, Bld: 103 mg/dL — ABNORMAL HIGH (ref 70–99)
Potassium: 3.7 mmol/L (ref 3.5–5.1)
Sodium: 140 mmol/L (ref 135–145)
Total Bilirubin: 1 mg/dL (ref 0.0–1.2)
Total Protein: 5.9 g/dL — ABNORMAL LOW (ref 6.5–8.1)

## 2023-04-05 LAB — CBC WITH DIFFERENTIAL (CANCER CENTER ONLY)
Abs Immature Granulocytes: 0.02 10*3/uL (ref 0.00–0.07)
Basophils Absolute: 0 10*3/uL (ref 0.0–0.1)
Basophils Relative: 0 %
Eosinophils Absolute: 0.3 10*3/uL (ref 0.0–0.5)
Eosinophils Relative: 10 %
HCT: 26.1 % — ABNORMAL LOW (ref 39.0–52.0)
Hemoglobin: 8.5 g/dL — ABNORMAL LOW (ref 13.0–17.0)
Immature Granulocytes: 1 %
Lymphocytes Relative: 22 %
Lymphs Abs: 0.7 10*3/uL (ref 0.7–4.0)
MCH: 25.8 pg — ABNORMAL LOW (ref 26.0–34.0)
MCHC: 32.6 g/dL (ref 30.0–36.0)
MCV: 79.1 fL — ABNORMAL LOW (ref 80.0–100.0)
Monocytes Absolute: 0.5 10*3/uL (ref 0.1–1.0)
Monocytes Relative: 16 %
Neutro Abs: 1.6 10*3/uL — ABNORMAL LOW (ref 1.7–7.7)
Neutrophils Relative %: 51 %
Platelet Count: 195 10*3/uL (ref 150–400)
RBC: 3.3 MIL/uL — ABNORMAL LOW (ref 4.22–5.81)
RDW: 18.7 % — ABNORMAL HIGH (ref 11.5–15.5)
WBC Count: 3.1 10*3/uL — ABNORMAL LOW (ref 4.0–10.5)
nRBC: 0.7 % — ABNORMAL HIGH (ref 0.0–0.2)

## 2023-04-05 LAB — CEA (ACCESS): CEA (CHCC): 5.15 ng/mL — ABNORMAL HIGH (ref 0.00–5.00)

## 2023-04-05 MED ORDER — IRINOTECAN HCL CHEMO INJECTION 100 MG/5ML
100.0000 mg/m2 | Freq: Once | INTRAVENOUS | Status: AC
  Administered 2023-04-05: 200 mg via INTRAVENOUS
  Filled 2023-04-05: qty 10

## 2023-04-05 MED ORDER — SODIUM CHLORIDE 0.9 % IV SOLN: Freq: Once | INTRAVENOUS | Status: AC

## 2023-04-05 MED ORDER — FLUOROURACIL CHEMO INJECTION 5 GM/100ML
2000.0000 mg/m2 | INTRAVENOUS | Status: DC
  Administered 2023-04-05: 4200 mg via INTRAVENOUS
  Filled 2023-04-05: qty 84

## 2023-04-05 MED ORDER — DEXAMETHASONE SODIUM PHOSPHATE 10 MG/ML IJ SOLN
10.0000 mg | Freq: Once | INTRAMUSCULAR | Status: AC
  Administered 2023-04-05: 10 mg via INTRAVENOUS
  Filled 2023-04-05: qty 1

## 2023-04-05 MED ORDER — ATROPINE SULFATE 1 MG/ML IV SOLN
0.5000 mg | Freq: Once | INTRAVENOUS | Status: AC | PRN
  Administered 2023-04-05: 0.5 mg via INTRAVENOUS
  Filled 2023-04-05: qty 1

## 2023-04-05 MED ORDER — HEPARIN SOD (PORK) LOCK FLUSH 100 UNIT/ML IV SOLN: 500.0000 [IU] | Freq: Once | INTRAVENOUS | Status: DC | PRN

## 2023-04-05 MED ORDER — PALONOSETRON HCL INJECTION 0.25 MG/5ML
0.2500 mg | Freq: Once | INTRAVENOUS | Status: AC
  Administered 2023-04-05: 0.25 mg via INTRAVENOUS
  Filled 2023-04-05: qty 5

## 2023-04-05 MED ORDER — SODIUM CHLORIDE 0.9% FLUSH
10.0000 mL | Freq: Once | INTRAVENOUS | Status: AC
  Administered 2023-04-05: 10 mL

## 2023-04-05 MED ORDER — SODIUM CHLORIDE 0.9% FLUSH
10.0000 mL | INTRAVENOUS | Status: DC | PRN
Start: 2023-04-05 — End: 2023-04-05

## 2023-04-05 MED ORDER — SODIUM CHLORIDE 0.9 % IV SOLN
400.0000 mg/m2 | Freq: Once | INTRAVENOUS | Status: AC
Start: 2023-04-05 — End: 2023-04-05
  Administered 2023-04-05: 844 mg via INTRAVENOUS
  Filled 2023-04-05: qty 25

## 2023-04-05 MED ORDER — DEXAMETHASONE 4 MG PO TABS: 4.0000 mg | ORAL_TABLET | Freq: Every day | ORAL | 0 refills | Status: DC

## 2023-04-05 NOTE — Progress Notes (Cosign Needed)
Patient Care Team: Corwin Levins, MD as PCP - General Jens Som Madolyn Frieze, MD as PCP - Cardiology (Cardiology) Iva Boop, MD as Consulting Physician (Gastroenterology) Gean Birchwood, MD as Consulting Physician (Orthopedic Surgery) Sallye Lat, MD as Consulting Physician (Ophthalmology) Malachy Mood, MD as Consulting Physician (Oncology)  Clinic Day:  04/08/2023  Referring physician: Malachy Mood, MD  ASSESSMENT & PLAN:   Assessment & Plan: 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 Treatment delayed due to his stroke on 04/18/21. S/p 8 cycles neoadjuvant Keytruda 05/11/21 - 09/28/21. He tolerated well with mild fatigue.  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 -He began FOLFOX on 11/24/21. He has tolerated well overall with some taste changes and mild fatigue -restaging CT scan 02/20/2022 showed PR -restaging CT from 05/15/2022 showed improved masslike enlargement of the duodenal as well as fold nodularity and thickening along the stomach, however it showed a new liver lesion, indeterminate, will obtain liver MRI  -Patient complained of worsening fatigue, last cycle chemo was held on 05/18/22. -Liver MRI in March 2024 showed benign liver lesions and a cystic lesion in pancreas, no evidence of liver metastasis.   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  No evidence of distant metastasis. -Due to the cancer progression, and his persistent peripheral neuropathy, it was recommended that treatment be changed to second line FOLFIRI, he started on 09/07/22, he is tolerating well so far  -Due to his moderate fatigue,  irinotecan dose was reduced slightly from C3 -His performance status has been low due to significant fatigue, required chemo dose reduction and chemo break  --Restaging CT scan from December 04, 2022 showed excellent response, the primary duodenal mass is not really measurable, no other signs of metastasis. -Treatment changed to maintenance irrinotecan alone on  12/21/2022. He is tolerating better  -CT CAP from 04/04/2023 showed significant increase in size of soft tissue mass involving the distal stomach and proximal duodenum. There is extension of abnormal soft tissue into the porta hepatis. There are small lymph nodes in the adjacent mesentery which are suspicious. The pancreatic cystic mass has increased in size with presence of ductal dilation. 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  -Restart 5-FU bolus and pump along with irinotecan.  Will start today, pending insurance approval.  This should be every 2 weeks as scheduled.    Plan: Patient seenin infusion  Labs reviewed  -CBC showing WBC 3.1; Hgb 8.5; Hct 26.4; Plt 195; Anc 1.6 -CMP - K 3.7; glucose 103; BUN 12; Creatinine 0.86; eGFR >60; Ca 8.5; AST 24; ALT 54; ALKP 83.   -CEA - 5.15 -reviewed results of restaging CT CAP with him and his wife. This showed significant increase in size of soft tissue mass involving the distal stomach and proximal duodenum. There is extension of abnormal soft tissue into the porta hepatis. There are small lymph nodes in the adjacent mesentery which are suspicious. The pancreatic cystic mass has increased in size with presence of ductal dilation. 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  -Plan to add back to his 5-FU pump to irinotecan every 2 weeks.  Plan to start today, pending insurance approval.  Will update appointments to reflect time needed  for addition of 5-FU along with second appointment 48 hours after infusion to discontinue 5-FU pump. -Labs/flush, follow-up, and treatment as  scheduled in 2 weeks.   The patient understands the plans discussed today and is in agreement with them.  He knows to contact our office if he develops concerns prior to his next appointment.  I provided 25 minutes of face-to-face time during this encounter and > 50% was spent counseling as documented under my assessment and plan.    Carlean Jews, NP  [facility] - A DEPT OF [facility] [address] [place] [address] Dept: [phone] Dept Fax: [phone]   Orders Placed This Encounter  Procedures   CBC with Differential (Cancer Center Only)    Standing Status:   Future    Expected Date:   04/19/2023    Expiration Date:   04/18/2024   CMP (Cancer Center only)    Standing Status:   Future    Expected Date:   04/19/2023    Expiration Date:   04/18/2024   CBC with Differential (Cancer Center Only)    Standing Status:   Future    Expected Date:   05/03/2023    Expiration Date:   05/02/2024   CMP (Cancer Center only)    Standing Status:   Future    Expected Date:   05/03/2023    Expiration Date:   05/02/2024   CBC with Differential (Cancer Center Only)    Standing Status:   Future    Expected Date:   05/17/2023    Expiration Date:   05/16/2024   CMP (Cancer Center only)    Standing Status:   Future    Expected Date:   05/17/2023    Expiration Date:   05/16/2024   CBC with Differential (Cancer Center Only)    Standing Status:   Future    Expected Date:   05/31/2023    Expiration Date:   05/30/2024   CMP (Cancer Center only)    Standing Status:   Future    Expected Date:   05/31/2023    Expiration Date:   05/30/2024   CEA (Access)-[facility] ONLY    Standing Status:   Standing    Number of Occurrences:   24    Expiration Date:   04/04/2024      CHIEF COMPLAINT:  CC: duodenal cancer   Current Treatment:  FOLFIRI s/p 7 cycles followed by maintenance irinotecan q 2 weeks   INTERVAL HISTORY:  Aaron Moss is here  today for repeat clinical assessment. He was last seen by Clayborn Heron, NP on 03/22/2023. Today, he presents for cycle 13 day 1. He has tolerated previous treatments ok with some gas, bloating, and diarrhea after treatments. He has been able to adjust home medications to manage symptoms. He states that taking tylenol has been helpful.  He did have restaging CT CAP 04/04/2023. reviewed results of restaging CT CAP with him and his wife. This showed significant increase in size of soft tissue mass involving the distal stomach and proximal duodenum. There is extension of abnormal soft tissue into the porta hepatis. There are small lymph nodes in the adjacent mesentery which are suspicious. The pancreatic cystic mass has increased in size with presence of ductal dilation. 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  He denies fevers or chills. He denies pain. Her reports improving energy.  His appetite is improving. His weight has been stable. He  denies chest pain, chest pressure, or shortness of breath. He denies headaches or visual disturbances.    I have reviewed the past medical history, past surgical history, social history and family history with the patient and they are unchanged from previous note.  ALLERGIES:  is allergic to lyrica [pregabalin] and ciprofloxacin.  MEDICATIONS:  Current Outpatient Medications  Medication Sig Dispense Refill   amLODipine (NORVASC) 10 MG tablet TAKE 1 TABLET BY MOUTH ONCE DAILY 90 tablet 3   aspirin EC 81 MG EC tablet Take 1 tablet (81 mg total) by mouth daily. Swallow whole. 30 tablet 11   atorvastatin (LIPITOR) 40 MG tablet Take 1 tablet by mouth once daily 90 tablet 2   b complex vitamins capsule Take 1 capsule by mouth daily.     benazepril (LOTENSIN) 40 MG tablet Take 1 tablet by mouth once daily 90 tablet 2   carvedilol (COREG) 12.5 MG tablet TAKE 1 TABLET BY MOUTH 2 TIMES DAILY. 180 tablet 3   cyanocobalamin (VITAMIN B12) 500 MCG tablet Take 500 mcg by mouth  daily.     dexamethasone (DECADRON) 4 MG tablet Take 1 tablet (4 mg total) by mouth daily. Starting the day after chemo, take for 3 to 5 days 20 tablet 0   diphenoxylate-atropine (LOMOTIL) 2.5-0.025 MG tablet Take 1-2 tablets by mouth 4 (four) times daily as needed for diarrhea or loose stools. 20 tablet 0   DULoxetine (CYMBALTA) 30 MG capsule Take 1 capsule (30 mg total) by mouth daily. 55 capsule 1   iron polysaccharides (NIFEREX) 150 MG capsule Take 1 capsule (150 mg total) by mouth 2 (two) times daily. 60 capsule 2   potassium chloride SA (KLOR-CON M20) 20 MEQ tablet Take 1 tablet (20 mEq total) by mouth 3 (three) times daily. 90 tablet 2   prochlorperazine (COMPAZINE) 10 MG tablet Take 1 tablet (10 mg total) by mouth every 6 (six) hours as needed for nausea or vomiting (Use for nausea and / or vomiting unresolved with ondansetron (Zofran).). 30 tablet 0   traMADol (ULTRAM) 50 MG tablet TAKE 1 TABLET BY MOUTH EVERY 12 HOURS AS NEEDED 60 tablet 2   Travoprost, BAK Free, (TRAVATAN) 0.004 % SOLN ophthalmic solution Place 1 drop into both eyes at bedtime.     VITAMIN D PO Take 2 tablets by mouth daily. Gummies     No current facility-administered medications for this visit.    HISTORY OF PRESENT ILLNESS:   Oncology History Overview Note   Cancer Staging  Adenocarcinoma of duodenum [facility]) Staging form: Small Intestine - Adenocarcinoma, AJCC 8th Edition - Clinical stage from 03/18/2021: Stage IIIA (cTX, cN1, cM0) - Signed by Malachy Mood, MD on 06/22/2021 Stage prefix: Initial diagnosis     Adenocarcinoma of duodenum (HCC)  03/01/2021 Imaging   EXAM: ABDOMEN ULTRASOUND COMPLETE  IMPRESSION: 1. Simple appearing right renal cyst. 2. Otherwise negative abdominal ultrasound   03/18/2021 Procedure   Upper Endoscopy/Colonoscopy, Dr. Myrtie Neither  Upper Endoscopy Impression: - Normal esophagus. - A single submucosal papule (nodule) found in the stomach. - Duodenal mass. Biopsied. Concerning for  malignancy.  Colonoscopy Impression: - Diverticulosis in the entire examined colon. - The examined portion of the ileum was normal. - Patent end-to-end colo-colonic anastomosis, characterized by healthy appearing mucosa. - Internal hemorrhoids. - No specimens collected.   03/18/2021 Initial Biopsy   Diagnosis Duodenum, Biopsy - INVASIVE MODERATE TO POORLY DIFFERENTIATED ADENOCARCINOMA   03/18/2021 Cancer Staging   Staging form: Small Intestine - Adenocarcinoma, AJCC 8th Edition -  Clinical stage from 03/18/2021: Stage IIIA (cTX, cN1, cM0) - Signed by Malachy Mood, MD on 06/22/2021 Stage prefix: Initial diagnosis   03/22/2021 Initial Diagnosis   Adenocarcinoma of duodenum (HCC)   04/01/2021 Imaging   EXAM: CT CHEST, ABDOMEN, AND PELVIS WITH CONTRAST  IMPRESSION: 1. Circumferential wall thickening of the first portion of the duodenum, in keeping with known primary malignancy. 2. Enlarged, hypodense lymph nodes anterior to the pancreatic head and in the portacaval station, concerning for nodal metastatic disease. 3. No other evidence of metastatic disease in the chest, abdomen, or pelvis. 4. Incidental note of a fluid attenuation lesion within the proximal pancreatic body measuring 1.9 x 1.4 cm, with prominence of the pancreatic duct distally, up to 0.6 cm. This lesion was present on remote prior examination dated 05/04/2009, and has slightly increased in size over a long period of time. This is consistent with a small IPMN and benign given indolent behavior over greater than 10 years. 5. Background of very fine centrilobular pulmonary nodules, most concentrated in the lung apices, most commonly seen in smoking-related respiratory bronchiolitis. 6. Coronary artery disease.   Aortic Atherosclerosis (ICD10-I70.0).    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 Of note, a variant  of uncertain significance was detected in the ATM (p.A869G), BLM (c.800-3T>G), and SDHA (p.V632F) genes. Report date is 04/27/2021.  The CancerNext-Expanded gene panel offered by [facility] and includes sequencing, rearrangement, and RNA analysis for the following 77 genes: AIP, ALK, APC, ATM, AXIN2, BAP1, BARD1, BLM, BMPR1A, BRCA1, BRCA2, BRIP1, CDC73, CDH1, CDK4, CDKN1B, CDKN2A, CHEK2, CTNNA1, DICER1, FANCC, FH, FLCN, GALNT12, KIF1B, LZTR1, MAX, MEN1, MET, MLH1, MSH2, MSH3, MSH6, MUTYH, NBN, NF1, NF2, NTHL1, PALB2, PHOX2B, PMS2, POT1, PRKAR1A, PTCH1, PTEN, RAD51C, RAD51D, RB1, RECQL, RET, SDHA, SDHAF2, SDHB, SDHC, SDHD, SMAD4, SMARCA4, SMARCB1, SMARCE1, STK11, SUFU, TMEM127, TP53, TSC1, TSC2, VHL and XRCC2 (sequencing and deletion/duplication); EGFR, EGLN1, HOXB13, KIT, MITF, PDGFRA, POLD1, and POLE (sequencing only); EPCAM and GREM1 (deletion/duplication only).    05/11/2021 - 09/28/2021 Chemotherapy   Patient is on Treatment Plan : COLORECTAL Pembrolizumab (200) q21d     11/24/2021 - 05/03/2022 Chemotherapy   Patient is on Treatment Plan : COLORECTAL FOLFOX q14d x 4 months     02/20/2022 Imaging    IMPRESSION: 1. Distal stomach/duodenal mass is decreased in size when compared with prior exam. 2. Stable upper abdominal adenopathy. 3. 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 4. Aortic Atherosclerosis (ICD10-I70.0).   05/15/2022 Imaging    IMPRESSION: Improved masslike enlargement of the duodenal as well as fold nodularity and thickening along the stomach. In addition there are some small nodes identified in the upper abdomen which are similar overall compared to the study of December 2023   However there is a new spiculated nodule in the middle lobe which is worrisome. In addition there are some small low-attenuation liver lesions which are too small to completely  characterize but were not clearly seen on the previous examination. Recommend further evaluation. For the liver lesions in particular a Eovist liver MRI may be of some benefit as clinically directed.   Persistent cystic lesion along the midbody of the pancreas with associated duct dilatation. A cystic neoplasm of the pancreas is possible.   06/06/2022 Imaging   MR Abdomen W Wo Contrast  IMPRESSION: 1. No definite evidence of hepatic metastatic disease. Scattered punctate foci of T2 hyperintensity throughout the liver are not associated with any suspicious enhancement or restricted diffusion and are favored to reflect small cysts or biliary hamartomas. Some of these are suboptimally characterized based on their small size, and given nonvisualization on older prior studies, attention on follow-up CT in 3-6 months recommended. 2. Unchanged residual irregular thickening of the walls of the distal stomach and proximal duodenum. 3. 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 This lesion is unchanged in size from recent CT, although has slowly enlarged from older prior examinations. Recommend follow-up abdominal MRI in 1 year. 4. Stable mild extrahepatic biliary dilatation status post cholecystectomy. 5. Aortic and branch vessel atherosclerosis, better seen on CT.   09/07/2022 -  Chemotherapy   Patient is on Treatment Plan : COLORECTAL FOLFIRI q14d         REVIEW OF SYSTEMS:   Constitutional: Denies fevers, chills or abnormal weight loss Eyes: Denies blurriness of vision Ears, nose, mouth, throat, and face: Denies mucositis or sore throat Respiratory: Denies cough, dyspnea or wheezes Cardiovascular: Denies palpitation, chest discomfort or lower extremity swelling Gastrointestinal:  Denies nausea, heartburn or change in bowel habits Skin: Denies abnormal skin rashes Lymphatics: Denies new lymphadenopathy  or easy bruising Neurological:Denies numbness, tingling or new weaknesses Behavioral/Psych: Mood is stable, no new changes  All other systems were reviewed with the patient and are negative.   VITALS:   Today's Vitals   04/05/23 1242  BP: 139/66  Pulse: 89  Resp: 18  Temp: 98.9 F (37.2 C)  SpO2: 98%  Weight: 211 lb (95.7 kg)   Body mass index is 34.06 kg/m.    Wt Readings from Last 3 Encounters:  04/05/23 211 lb 8 oz (95.9 kg)  04/05/23 211 lb (95.7 kg)  03/22/23 211 lb 6.4 oz (95.9 kg)    Body mass index is 34.06 kg/m.  Performance status (ECOG): 1 - Symptomatic but completely ambulatory  PHYSICAL EXAM:   GENERAL:alert, no distress and comfortable SKIN: skin color, texture, turgor are normal, no rashes or significant lesions EYES: normal, Conjunctiva are pink and non-injected, sclera clear OROPHARYNX:no exudate, no erythema and lips, buccal mucosa, and tongue normal  NECK: supple, thyroid normal size, non-tender, without nodularity LYMPH:  no palpable lymphadenopathy in the cervical, axillary or inguinal LUNGS: clear to auscultation and percussion with normal breathing effort HEART: regular rate & rhythm and no murmurs and no lower extremity edema ABDOMEN:abdomen soft, non-tender and normal bowel sounds Musculoskeletal:no cyanosis of digits and no clubbing  NEURO: alert & oriented x 3 with fluent speech, no focal motor/sensory deficits  LABORATORY DATA:  I have reviewed the data as listed    Component Value Date/Time   NA 140 04/05/2023 1154   K 3.7 04/05/2023 1154   CL 107 04/05/2023 1154   CO2 27 04/05/2023 1154   GLUCOSE 103 (H) 04/05/2023 1154   BUN 12 04/05/2023 1154   CREATININE 0.86 04/05/2023 1154   CALCIUM 8.5 (L) 04/05/2023 1154   PROT 5.9 (L) 04/05/2023 1154   ALBUMIN 2.9 (L) 04/05/2023 1154   AST 24 04/05/2023 1154   ALT 54 (H) 04/05/2023 1154   ALKPHOS 83 04/05/2023 1154   BILITOT 1.0 04/05/2023 1154   GFRNONAA >60 04/05/2023 1154   GFRAA  >60 02/24/2019 0207    Lab Results  Component Value Date   WBC 3.1 (L) 04/05/2023   NEUTROABS 1.6 (  L) 04/05/2023   HGB 8.5 (L) 04/05/2023   HCT 26.1 (L) 04/05/2023   MCV 79.1 (L) 04/05/2023   PLT 195 04/05/2023      RADIOGRAPHIC STUDIES: CT CHEST ABDOMEN PELVIS W CONTRAST Result Date: 04/05/2023 CLINICAL DATA:  Small bowel cancer, adenocarcinoma of the duodenum. * Tracking Code: BO * EXAM: CT CHEST, ABDOMEN, AND PELVIS WITH CONTRAST TECHNIQUE: Multidetector CT imaging of the chest, abdomen and pelvis was performed following the standard protocol during bolus administration of intravenous contrast. RADIATION DOSE REDUCTION: This exam was performed according to the departmental dose-optimization program which includes automated exposure control, adjustment of the mA and/or kV according to patient size and/or use of iterative reconstruction technique. CONTRAST:  OMNIPAQUE IOHEXOL 300 MG/ML  SOLN COMPARISON:  CT 12/04/2022. FINDINGS: CT CHEST FINDINGS Cardiovascular: Left upper chest port is accessed. Tip at the SVC right atrial junction. Heart is nonenlarged. Trace pericardial fluid. Coronary artery calcifications are seen. Thoracic aorta has a normal course and caliber. Scattered vascular calcifications. Bovine type aortic arch. Mediastinum/Nodes: Preserved thyroid gland. Normal caliber thoracic esophagus. Specific abnormal lymph node enlargement seen in the axillary region. There are some small hilar nodes identified. Example on the right measures 11 mm in short axis today on series 2, image 26 and previously 10 mm. Small subcentimeter mediastinal nodes are also noted and similar to previous. Lungs/Pleura: No consolidation, pneumothorax or effusion. There is some linear opacity in the left lower lobe likely scar or atelectasis, unchanged from previous. Minimal changes as well in the superior segment of the right lower lobe. Few tiny sub 3 mm nodular areas identified peripherally in the left  lung such as series 4, image 53, 55, similar to previous when adjusting for slice misregistration, nonspecific. Musculoskeletal: Degenerative changes of the spine with multilevel bridging syndesmophytes and osteophytes. Focal lipoma along the musculature superficial to the right scapula, unchanged. Small central calcification. CT ABDOMEN PELVIS FINDINGS Hepatobiliary: No focal liver abnormality is seen. Status post cholecystectomy. No biliary dilatation. Pancreas: There is a cystic lesion along the midbody of the pancreas which previously measured 15 15 mm and today measured in the same fashion 2.2 by 2.0 cm on series 2, image 53. There is dilatation of the pancreatic duct towards the tail with atrophy. Findings are worrisome for increasing cystic pancreatic neoplasm. Spleen: Normal in size without focal abnormality. Adrenals/Urinary Tract: Adrenal glands are preserved. Lower pole Bosniak 1 right-sided renal cyst is again noted. No enhancing renal mass or collecting system dilatation. Small central simple cysts as well in the right mid kidney. The ureters have normal course and caliber extending down to the bladder. Preserved contours of the urinary bladder. Bladder is underdistended. Stomach/Bowel: Surgical changes identified from a gastrojejunostomy. Along the distal stomach, proximal duodenal is a large heterogeneous mass which is significantly increased. Now on series 2, image 55 this measures 7.5 by 5.8 cm. This addition there is abnormal soft tissue extending posterior and superior to this has areas extending into the porta hepatis as seen on axial series 2, image 51 measuring 4.0 by 2.5 cm. Please see coronal image 50 of series 5 with this tissue encasing the course of the hepatic artery. Vascular/Lymphatic: Extensive vascular calcifications. Normal caliber aorta and IVC. There are some small nodes identified posterior to the stomach and near the splenic vein and splenic artery on series 2, image 52 which are  nonpathologic by size criteria. Small mesenteric nodes identified. Example series 2, image 64 measures 9 11 mm. This has abnormal morphology.  No other retroperitoneal and pelvic nodes Reproductive: Surgical changes along the prostate. Other: No free air free fluid. There is midline anterior epigastric midline hernia involving small bowel. Musculoskeletal: Curvature and degenerative changes along the spine. Degenerative changes of the pelvis. IMPRESSION: Significant increase in size of soft tissue mass involving the distal stomach and proximal duodenum with the extension of the abnormal soft tissue extending into the porta hepatis. Additional adjacent small lymph nodes in the adjacent mesentery are suspicious. Stable prominent right hilar lymph node and small mediastinal nodes. These similar to previous. Increasing size cystic pancreatic lesion centrally with the ductal dilatation. Please correlate for a cystic pancreatic neoplasm. Electronically Signed   By: Karen Kays M.D.   On: 04/05/2023 11:51    Addendum I have seen the patient, examined him. I agree with the assessment and and plan and have edited the notes.   I personally reviewed his restaging CT scan from April 04, 2023, which showed a significant increase in size of the soft tissue mass involving the distal stomach and proximal duodenum, which is that his primary tumor.  Scan also showed increased small adenopathy in abdomen.  Due to his disease progression, I recommend changing his treatment back to FOLFIRI, he previously responded well and tolerated well except for fatigue.  Patient is agreeable with the plan, will start today.  All questions were answered.  I spent a total of 25 minutes for his visit today, more than 50% time on face-to-face counseling.  Malachy Mood MD 04/05/2023

## 2023-04-05 NOTE — Progress Notes (Signed)
Per Mosetta Putt MD, ok to run Fluorouracil pump over 44 hours.

## 2023-04-05 NOTE — Assessment & Plan Note (Addendum)
diagnosed 02/2021 by endo-/colonoscopy for weight loss and anemia. Treatment delayed due to his stroke on 04/18/21. S/p 8 cycles neoadjuvant Keytruda 05/11/21 - 09/28/21. He tolerated well with mild fatigue.  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 -He began FOLFOX on 11/24/21. He has tolerated well overall with some taste changes and mild fatigue -restaging CT scan 02/20/2022 showed PR -restaging CT from 05/15/2022 showed improved masslike enlargement of the duodenal as well as fold nodularity and thickening along the stomach, however it showed a new liver lesion, indeterminate, will obtain liver MRI  -Patient complained of worsening fatigue, last cycle chemo was held on 05/18/22. -Liver MRI in March 2024 showed benign liver lesions and a cystic lesion in pancreas, no evidence of liver metastasis.   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  No evidence of distant metastasis. 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 -Treatment changed to maintenance irrinotecan alone on  12/21/2022. He is tolerating better  -CT CAP from 04/04/2023 showed significant increase in size of soft tissue mass involving the  distal stomach and proximal duodenum. There is extension of abnormal soft tissue into the porta hepatis. There are small lymph nodes in the adjacent mesentery which are suspicious. The pancreatic cystic mass has increased in size with presence of ductal dilation. 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  -Restart 5-FU bolus and pump along with irinotecan.  Will start today, pending insurance approval.  This should be every 2 weeks as scheduled.

## 2023-04-05 NOTE — Patient Instructions (Addendum)
[facility] - A DEPT OF [facility]  Discharge Instructions: Thank you for choosing [facility] to provide your oncology and hematology care.   If you have a lab appointment with the Cancer Center, please go directly to the Cancer Center and check in at the registration area.   Wear comfortable clothing and clothing appropriate for easy access to any Portacath or PICC line.   We strive to give you quality time with your provider. You may need to reschedule your appointment if you arrive late (15 or more minutes).  Arriving late affects you and other patients whose appointments are after yours.  Also, if you miss three or more appointments without notifying the office, you may be dismissed from the clinic at the provider's discretion.      For prescription refill requests, have your pharmacy contact our office and allow 72 hours for refills to be completed.    Today you received the following chemotherapy and/or immunotherapy agents: Irinotecan, Fluorouracil.       To help prevent nausea and vomiting after your treatment, we encourage you to take your nausea medication as directed.  BELOW ARE SYMPTOMS THAT SHOULD BE REPORTED IMMEDIATELY: *FEVER GREATER THAN 100.4 F (38 C) OR HIGHER *CHILLS OR SWEATING *NAUSEA AND VOMITING THAT IS NOT CONTROLLED WITH YOUR NAUSEA MEDICATION *UNUSUAL SHORTNESS OF BREATH *UNUSUAL BRUISING OR BLEEDING *URINARY PROBLEMS (pain or burning when urinating, or frequent urination) *BOWEL PROBLEMS (unusual diarrhea, constipation, pain near the anus) TENDERNESS IN MOUTH AND THROAT WITH OR WITHOUT PRESENCE OF ULCERS (sore throat, sores in mouth, or a toothache) UNUSUAL RASH, SWELLING OR PAIN  UNUSUAL VAGINAL DISCHARGE OR ITCHING   Items with * indicate a potential emergency and should be followed up as soon as possible or go to the Emergency Department if any problems should occur.  Please show the CHEMOTHERAPY ALERT CARD  or IMMUNOTHERAPY ALERT CARD at check-in to the Emergency Department and triage nurse.  Should you have questions after your visit or need to cancel or reschedule your appointment, please contact [facility] - A DEPT [facility]  Dept: [phone]  and follow the prompts.  Office hours are 8:00 a.m. to 4:30 p.m. Monday - Friday. Please note that voicemails left after 4:00 p.m. may not be returned until the following business day.  We are closed weekends and major holidays. You have access to a nurse at all times for urgent questions. Please call the main number to the clinic Dept: [phone] and follow the prompts.   For any non-urgent questions, you may also contact your provider using MyChart. We now offer e-Visits for anyone 73 and older to request care online for non-urgent symptoms. For details visit [email or web address].   Also download the MyChart app! Go to the app store, search "MyChart", open the app, select [facility], and log in with your MyChart username and password.

## 2023-04-06 ENCOUNTER — Other Ambulatory Visit: Payer: Self-pay

## 2023-04-06 ENCOUNTER — Other Ambulatory Visit: Payer: Self-pay | Admitting: Hematology

## 2023-04-06 DIAGNOSIS — C170 Malignant neoplasm of duodenum: Secondary | ICD-10-CM

## 2023-04-07 ENCOUNTER — Inpatient Hospital Stay: Payer: Medicare Other

## 2023-04-07 VITALS — BP 113/45 | HR 80 | Temp 97.7°F | Resp 18

## 2023-04-07 DIAGNOSIS — C170 Malignant neoplasm of duodenum: Secondary | ICD-10-CM

## 2023-04-07 DIAGNOSIS — Z5189 Encounter for other specified aftercare: Secondary | ICD-10-CM | POA: Diagnosis not present

## 2023-04-07 DIAGNOSIS — Z79899 Other long term (current) drug therapy: Secondary | ICD-10-CM | POA: Diagnosis not present

## 2023-04-07 DIAGNOSIS — D508 Other iron deficiency anemias: Secondary | ICD-10-CM | POA: Diagnosis not present

## 2023-04-07 DIAGNOSIS — Z5111 Encounter for antineoplastic chemotherapy: Secondary | ICD-10-CM | POA: Diagnosis not present

## 2023-04-07 DIAGNOSIS — Z7982 Long term (current) use of aspirin: Secondary | ICD-10-CM | POA: Diagnosis not present

## 2023-04-07 MED ORDER — PEGFILGRASTIM-CBQV 6 MG/0.6ML ~~LOC~~ SOSY
6.0000 mg | PREFILLED_SYRINGE | Freq: Once | SUBCUTANEOUS | Status: AC
Start: 2023-04-07 — End: 2023-04-07
  Administered 2023-04-07: 6 mg via SUBCUTANEOUS

## 2023-04-07 MED ORDER — HEPARIN SOD (PORK) LOCK FLUSH 100 UNIT/ML IV SOLN
500.0000 [IU] | Freq: Once | INTRAVENOUS | Status: AC | PRN
  Administered 2023-04-07: 500 [IU]

## 2023-04-07 MED ORDER — SODIUM CHLORIDE 0.9% FLUSH
10.0000 mL | INTRAVENOUS | Status: DC | PRN
Start: 2023-04-07 — End: 2023-04-07
  Administered 2023-04-07: 10 mL

## 2023-04-10 ENCOUNTER — Encounter: Payer: Self-pay | Admitting: Hematology

## 2023-04-10 ENCOUNTER — Encounter: Payer: Self-pay | Admitting: Nurse Practitioner

## 2023-04-18 NOTE — Progress Notes (Signed)
Patient Care Team: Corwin Levins, MD as PCP - General Jens Som Madolyn Frieze, MD as PCP - Cardiology (Cardiology) Iva Boop, MD as Consulting Physician (Gastroenterology) Gean Birchwood, MD as Consulting Physician (Orthopedic Surgery) Sallye Lat, MD as Consulting Physician (Ophthalmology) Malachy Mood, MD as Consulting Physician (Oncology)  Clinic Day:  04/19/2023  Referring physician: Malachy Mood, MD  ASSESSMENT & PLAN:   Assessment & Plan: 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 Treatment delayed due to his stroke on 04/18/21. S/p 8 cycles neoadjuvant Keytruda 05/11/21 - 09/28/21. He tolerated well with mild fatigue.  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 -He began FOLFOX on 11/24/21. He has tolerated well overall with some taste changes and mild fatigue -restaging CT scan 02/20/2022 showed PR -restaging CT from 05/15/2022 showed improved masslike enlargement of the duodenal as well as fold nodularity and thickening along the stomach, however it showed a new liver lesion, indeterminate, will obtain liver MRI  -Patient complained of worsening fatigue, last cycle chemo was held on 05/18/22. -Liver MRI in March 2024 showed benign liver lesions and a cystic lesion in pancreas, no evidence of liver metastasis.   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  No evidence of distant metastasis. -Due to the cancer progression, and his persistent peripheral neuropathy, it was recommended that treatment be changed to second line FOLFIRI, he started on 09/07/22, he is tolerating well so far  -Due to his moderate fatigue,  irinotecan dose was reduced slightly from C3 -His performance status has been low due to significant fatigue, required chemo dose reduction and chemo break  --Restaging CT scan from December 04, 2022 showed excellent response, the primary duodenal mass is not really measurable, no other signs of metastasis. -Treatment changed to maintenance irrinotecan alone on  12/21/2022. He is tolerating better  -CT CAP from 04/04/2023 showed significant increase in size of soft tissue mass involving the distal stomach and proximal duodenum. There is extension of abnormal soft tissue into the porta hepatis. There are small lymph nodes in the adjacent mesentery which are suspicious. The pancreatic cystic mass has increased in size with presence of ductal dilation. 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  -Restart 5-FU bolus and pump along with irinotecan.   -04/19/2023 -patient tolerating restarting of FOLFIRI well overall.  Moderate fatigue but manageable.  Proceed with cycle 14 day 1 today.   Plan: Labs reviewed  -CBC showing WBC 10.0; Hgb 8.0; Hct 25.7; Plt 128; Anc 8.4 -CMP - K 3.7; glucose 113; BUN 12; Creatinine 0.88; eGFR >60; Ca 8.3; LFTs normal.   Patient tolerating restart of FOLFIRI well overall.  His PS and labs are satisfactory for treatment today. Proceed with cycle 14 day 1 FOLFIRI Labs/flush, follow-up, and treatment in 2 weeks as scheduled.  The patient understands the plans discussed today and is in agreement with them.  He knows to contact our office if he develops concerns prior to his next appointment.  I provided 25 minutes of face-to-face time during this encounter and > 50% was spent counseling as documented under my assessment and plan.    Carlean Jews, NP  [facility] -  A DEPT OF [facility] [address] [place] [address] Dept: [phone] Dept Fax: [phone]   No orders of the defined  types were placed in this encounter.     CHIEF COMPLAINT:  CC: Adenocarcinoma of duodenum  Current Treatment: FOLFIRI every 2 weeks  INTERVAL HISTORY:  Aaron Moss is here today for repeat clinical assessment.  He was last seen by myself on 04/05/2023.  And treatment changed back to FOLFIRI due to disease progression noticed on CT scan done 04/04/2023.  Had been on this regimen before and responded well.  Patient reports moderate fatigue.  Does states that his energy levels and appetite are much better while on steroid for first few days after chemo.  This declines after completion of dexamethasone.  He denies nausea and vomiting.  Reports relatively normal bowel movements, though sometimes they are loose.  He states gas and bloating are slightly improved since most recent visit.  He does have some neuropathy in his fingers which is no worse than before.  Not interfering with routine activities. He denies chest pain, chest pressure, or shortness of breath. He denies headaches or visual disturbances. He denies fevers or chills. He denies pain. His appetite is fair. His weight has been stable.  I have reviewed the past medical history, past surgical history, social history and family history with the patient and they are unchanged from previous note.  ALLERGIES:  is allergic to lyrica [pregabalin] and ciprofloxacin.  MEDICATIONS:  Current Outpatient Medications  Medication Sig Dispense Refill   amLODipine (NORVASC) 10 MG tablet TAKE 1 TABLET BY MOUTH ONCE DAILY 90 tablet 3   aspirin EC 81 MG EC tablet Take 1 tablet (81 mg total) by mouth daily. Swallow whole. 30 tablet 11   atorvastatin (LIPITOR) 40 MG tablet Take 1 tablet by mouth once daily 90 tablet 2   b complex vitamins capsule Take 1 capsule by mouth daily.     benazepril (LOTENSIN) 40 MG tablet Take 1 tablet by mouth once daily 90 tablet 2   carvedilol (COREG) 12.5 MG tablet TAKE 1 TABLET BY MOUTH 2 TIMES DAILY. 180 tablet 3   cyanocobalamin  (VITAMIN B12) 500 MCG tablet Take 500 mcg by mouth daily.     dexamethasone (DECADRON) 4 MG tablet Take 1 tablet (4 mg total) by mouth daily. Starting the day after chemo, take for 3 to 5 days 20 tablet 0   diphenoxylate-atropine (LOMOTIL) 2.5-0.025 MG tablet Take 1-2 tablets by mouth 4 (four) times daily as needed for diarrhea or loose stools. 20 tablet 0   DULoxetine (CYMBALTA) 30 MG capsule Take 1 capsule (30 mg total) by mouth daily. 55 capsule 1   iron polysaccharides (NIFEREX) 150 MG capsule Take 1 capsule (150 mg total) by mouth 2 (two) times daily. 60 capsule 2   potassium chloride SA (KLOR-CON M20) 20 MEQ tablet Take 1 tablet (20 mEq total) by mouth 3 (three) times daily. 90 tablet 2   prochlorperazine (COMPAZINE) 10 MG tablet Take 1 tablet (10 mg total) by mouth every 6 (six) hours as needed for nausea or vomiting (Use for nausea and / or vomiting unresolved with ondansetron (Zofran).). 30 tablet 0   traMADol (ULTRAM) 50 MG tablet TAKE 1 TABLET BY MOUTH EVERY 12 HOURS AS NEEDED 60 tablet 2   Travoprost, BAK Free, (TRAVATAN) 0.004 % SOLN ophthalmic solution Place 1 drop into both eyes at bedtime.     VITAMIN D PO Take 2 tablets by mouth daily.  Gummies     No current facility-administered medications for this visit.    HISTORY OF PRESENT ILLNESS:   Oncology History Overview Note   Cancer Staging  Adenocarcinoma of duodenum [facility]) Staging form: Small Intestine - Adenocarcinoma, AJCC 8th Edition - Clinical stage from 03/18/2021: Stage IIIA (cTX, cN1, cM0) - Signed by Malachy Mood, MD on 06/22/2021 Stage prefix: Initial diagnosis     Adenocarcinoma of duodenum (HCC)  03/01/2021 Imaging   EXAM: ABDOMEN ULTRASOUND COMPLETE  IMPRESSION: 1. Simple appearing right renal cyst. 2. Otherwise negative abdominal ultrasound   03/18/2021 Procedure   Upper Endoscopy/Colonoscopy, Dr. Myrtie Neither  Upper Endoscopy Impression: - Normal esophagus. - A single submucosal papule (nodule) found in the  stomach. - Duodenal mass. Biopsied. Concerning for malignancy.  Colonoscopy Impression: - Diverticulosis in the entire examined colon. - The examined portion of the ileum was normal. - Patent end-to-end colo-colonic anastomosis, characterized by healthy appearing mucosa. - Internal hemorrhoids. - No specimens collected.   03/18/2021 Initial Biopsy   Diagnosis Duodenum, Biopsy - INVASIVE MODERATE TO POORLY DIFFERENTIATED ADENOCARCINOMA   03/18/2021 Cancer Staging   Staging form: Small Intestine - Adenocarcinoma, AJCC 8th Edition - Clinical stage from 03/18/2021: Stage IIIA (cTX, cN1, cM0) - Signed by Malachy Mood, MD on 06/22/2021 Stage prefix: Initial diagnosis   03/22/2021 Initial Diagnosis   Adenocarcinoma of duodenum (HCC)   04/01/2021 Imaging   EXAM: CT CHEST, ABDOMEN, AND PELVIS WITH CONTRAST  IMPRESSION: 1. Circumferential wall thickening of the first portion of the duodenum, in keeping with known primary malignancy. 2. Enlarged, hypodense lymph nodes anterior to the pancreatic head and in the portacaval station, concerning for nodal metastatic disease. 3. No other evidence of metastatic disease in the chest, abdomen, or pelvis. 4. Incidental note of a fluid attenuation lesion within the proximal pancreatic body measuring 1.9 x 1.4 cm, with prominence of the pancreatic duct distally, up to 0.6 cm. This lesion was present on remote prior examination dated 05/04/2009, and has slightly increased in size over a long period of time. This is consistent with a small IPMN and benign given indolent behavior over greater than 10 years. 5. Background of very fine centrilobular pulmonary nodules, most concentrated in the lung apices, most commonly seen in smoking-related respiratory bronchiolitis. 6. Coronary artery disease.   Aortic Atherosclerosis (ICD10-I70.0).    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 Of note, a variant of uncertain significance was detected in the ATM (p.A869G), BLM (c.800-3T>G), and SDHA (p.V632F) genes. Report date is 04/27/2021.  The CancerNext-Expanded gene panel offered by [facility] and includes sequencing, rearrangement, and RNA analysis for the following 77 genes: AIP, ALK, APC, ATM, AXIN2, BAP1, BARD1, BLM, BMPR1A, BRCA1, BRCA2, BRIP1, CDC73, CDH1, CDK4, CDKN1B, CDKN2A, CHEK2, CTNNA1, DICER1, FANCC, FH, FLCN, GALNT12, KIF1B, LZTR1, MAX, MEN1, MET, MLH1, MSH2, MSH3, MSH6, MUTYH, NBN, NF1, NF2, NTHL1, PALB2, PHOX2B, PMS2, POT1, PRKAR1A, PTCH1, PTEN, RAD51C, RAD51D, RB1, RECQL, RET, SDHA, SDHAF2, SDHB, SDHC, SDHD, SMAD4, SMARCA4, SMARCB1, SMARCE1, STK11, SUFU, TMEM127, TP53, TSC1, TSC2, VHL and XRCC2 (sequencing and deletion/duplication); EGFR, EGLN1, HOXB13, KIT, MITF, PDGFRA, POLD1, and POLE (sequencing only); EPCAM and GREM1 (deletion/duplication only).    05/11/2021 - 09/28/2021 Chemotherapy   Patient is on Treatment Plan : COLORECTAL Pembrolizumab (200) q21d     11/24/2021 - 05/03/2022 Chemotherapy   Patient is on Treatment Plan : COLORECTAL FOLFOX q14d x 4 months     02/20/2022  Imaging    IMPRESSION: 1. Distal stomach/duodenal mass is decreased in size when compared with prior exam. 2. Stable upper abdominal adenopathy. 3. 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 4. Aortic Atherosclerosis (ICD10-I70.0).   05/15/2022 Imaging    IMPRESSION: Improved masslike enlargement of the duodenal as well as fold nodularity and thickening along the stomach. In addition there are some small nodes identified in the upper abdomen which are similar overall compared to the study of December 2023   However there is a new spiculated nodule in the middle lobe which is worrisome. In addition there are some small low-attenuation  liver lesions which are too small to completely characterize but were not clearly seen on the previous examination. Recommend further evaluation. For the liver lesions in particular a Eovist liver MRI may be of some benefit as clinically directed.   Persistent cystic lesion along the midbody of the pancreas with associated duct dilatation. A cystic neoplasm of the pancreas is possible.   06/06/2022 Imaging   MR Abdomen W Wo Contrast     IMPRESSION: 1. No definite evidence of hepatic metastatic disease. Scattered punctate foci of T2 hyperintensity throughout the liver are not associated with any suspicious enhancement or restricted diffusion and are favored to reflect small cysts or biliary hamartomas. Some of these are suboptimally characterized based on their small size, and given nonvisualization on older prior studies, attention on follow-up CT in 3-6 months recommended. 2. Unchanged residual irregular thickening of the walls of the distal stomach and proximal duodenum. 3. 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 This lesion is unchanged in size from recent CT, although has slowly enlarged from older prior examinations. Recommend follow-up abdominal MRI in 1 year. 4. Stable mild extrahepatic biliary dilatation status post cholecystectomy. 5. Aortic and branch vessel atherosclerosis, better seen on CT.   09/07/2022 -  Chemotherapy   Patient is on Treatment Plan : COLORECTAL FOLFIRI q14d     04/04/2023 Imaging   CT chest abdomen and pelvis with contrast IMPRESSION: Significant increase in size of soft tissue mass involving the distal stomach and proximal duodenum with the extension of the abnormal soft tissue extending into the porta hepatis. Additional adjacent small lymph nodes in the adjacent mesentery are suspicious. Stable prominent right hilar lymph node and small mediastinal nodes. These similar  to previous.   Increasing size cystic pancreatic lesion centrally with the ductal dilatation. Please correlate for a cystic pancreatic neoplasm.         REVIEW OF SYSTEMS:   Constitutional: Denies fevers, chills or abnormal weight loss Eyes: Denies blurriness of vision Ears, nose, mouth, throat, and face: Denies mucositis or sore throat Respiratory: Denies cough, dyspnea or wheezes Cardiovascular: Denies palpitation, chest discomfort or lower extremity swelling Gastrointestinal:  Denies nausea, heartburn or change in bowel habits.  Slightly improved gas and bloating since last visit. Skin: Denies abnormal skin rashes Lymphatics: Denies new lymphadenopathy or easy bruising Neurological:Denies numbness, tingling or new weaknesses Behavioral/Psych: Mood is stable, no new changes  All other systems were reviewed with the patient and are negative.   VITALS:   Today's Vitals   04/19/23 1145  BP: (!) 128/56  Pulse: 77  Temp: 97.7 F (36.5 C)  TempSrc: Temporal  SpO2: 100%  Weight: 211 lb (95.7 kg)  Height: 5\' 6"  (1.676 m)  PainSc: 0-No pain  Body mass index is 34.06 kg/m.   Wt Readings from Last 3 Encounters:  04/19/23 211 lb (95.7 kg)  04/05/23 211 lb 8 oz (95.9 kg)  04/05/23 211 lb (95.7 kg)    Body mass index is 34.06 kg/m.  Performance status (ECOG): 1 - Symptomatic but completely ambulatory  PHYSICAL EXAM:   GENERAL:alert, no distress and comfortable SKIN: skin color, texture, turgor are normal, no rashes or significant lesions EYES: normal, Conjunctiva are pink and non-injected, sclera clear OROPHARYNX:no exudate, no erythema and lips, buccal mucosa, and tongue normal  NECK: supple, thyroid normal size, non-tender, without nodularity LYMPH:  no palpable lymphadenopathy in the cervical, axillary or inguinal LUNGS: clear to auscultation and percussion with normal breathing effort HEART: regular rate & rhythm and no murmurs and no lower extremity  edema ABDOMEN:abdomen soft, non-tender and normal bowel sounds Musculoskeletal:no cyanosis of digits and no clubbing  NEURO: alert & oriented x 3 with fluent speech, no focal motor/sensory deficits  LABORATORY DATA:  I have reviewed the data as listed    Component Value Date/Time   NA 142 04/19/2023 1107   K 3.7 04/19/2023 1107   CL 109 04/19/2023 1107   CO2 29 04/19/2023 1107   GLUCOSE 113 (H) 04/19/2023 1107   BUN 12 04/19/2023 1107   CREATININE 0.88 04/19/2023 1107   CALCIUM 8.3 (L) 04/19/2023 1107   PROT 5.5 (L) 04/19/2023 1107   ALBUMIN 2.9 (L) 04/19/2023 1107   AST 19 04/19/2023 1107   ALT 26 04/19/2023 1107   ALKPHOS 112 04/19/2023 1107   BILITOT 1.0 04/19/2023 1107   GFRNONAA >60 04/19/2023 1107   GFRAA >60 02/24/2019 0207    Lab Results  Component Value Date   WBC 10.0 04/19/2023   NEUTROABS 8.4 (H) 04/19/2023   HGB 8.0 (L) 04/19/2023   HCT 25.7 (L) 04/19/2023   MCV 82.9 04/19/2023   PLT 128 (L) 04/19/2023     RADIOGRAPHIC STUDIES: CT CHEST ABDOMEN PELVIS W CONTRAST Result Date: 04/05/2023 CLINICAL DATA:  Small bowel cancer, adenocarcinoma of the duodenum. * Tracking Code: BO * EXAM: CT CHEST, ABDOMEN, AND PELVIS WITH CONTRAST TECHNIQUE: Multidetector CT imaging of the chest, abdomen and pelvis was performed following the standard protocol during bolus administration of intravenous contrast. RADIATION DOSE REDUCTION: This exam was performed according to the departmental dose-optimization program which includes automated exposure control, adjustment of the mA and/or kV according to patient size and/or use of iterative reconstruction technique. CONTRAST:  OMNIPAQUE IOHEXOL 300 MG/ML  SOLN COMPARISON:  CT 12/04/2022. FINDINGS: CT CHEST FINDINGS Cardiovascular: Left upper chest port is accessed. Tip at the SVC right atrial junction. Heart is nonenlarged. Trace pericardial fluid. Coronary artery calcifications are seen. Thoracic aorta has a normal course and caliber.  Scattered vascular calcifications. Bovine type aortic arch. Mediastinum/Nodes: Preserved thyroid gland. Normal caliber thoracic esophagus. Specific abnormal lymph node enlargement seen in the axillary region. There are some small hilar nodes identified. Example on the right measures 11 mm in short axis today on series 2, image 26 and previously 10 mm. Small subcentimeter mediastinal nodes are also noted and similar to previous. Lungs/Pleura: No consolidation, pneumothorax or effusion. There is some linear opacity in the left lower lobe likely scar or atelectasis, unchanged from previous. Minimal changes as well in the superior segment of the right lower lobe. Few tiny sub 3 mm nodular areas identified peripherally in the left lung such as series 4, image 53, 55, similar to previous when adjusting for slice misregistration, nonspecific.  Musculoskeletal: Degenerative changes of the spine with multilevel bridging syndesmophytes and osteophytes. Focal lipoma along the musculature superficial to the right scapula, unchanged. Small central calcification. CT ABDOMEN PELVIS FINDINGS Hepatobiliary: No focal liver abnormality is seen. Status post cholecystectomy. No biliary dilatation. Pancreas: There is a cystic lesion along the midbody of the pancreas which previously measured 15 15 mm and today measured in the same fashion 2.2 by 2.0 cm on series 2, image 53. There is dilatation of the pancreatic duct towards the tail with atrophy. Findings are worrisome for increasing cystic pancreatic neoplasm. Spleen: Normal in size without focal abnormality. Adrenals/Urinary Tract: Adrenal glands are preserved. Lower pole Bosniak 1 right-sided renal cyst is again noted. No enhancing renal mass or collecting system dilatation. Small central simple cysts as well in the right mid kidney. The ureters have normal course and caliber extending down to the bladder. Preserved contours of the urinary bladder. Bladder is underdistended.  Stomach/Bowel: Surgical changes identified from a gastrojejunostomy. Along the distal stomach, proximal duodenal is a large heterogeneous mass which is significantly increased. Now on series 2, image 55 this measures 7.5 by 5.8 cm. This addition there is abnormal soft tissue extending posterior and superior to this has areas extending into the porta hepatis as seen on axial series 2, image 51 measuring 4.0 by 2.5 cm. Please see coronal image 50 of series 5 with this tissue encasing the course of the hepatic artery. Vascular/Lymphatic: Extensive vascular calcifications. Normal caliber aorta and IVC. There are some small nodes identified posterior to the stomach and near the splenic vein and splenic artery on series 2, image 52 which are nonpathologic by size criteria. Small mesenteric nodes identified. Example series 2, image 64 measures 9 11 mm. This has abnormal morphology. No other retroperitoneal and pelvic nodes Reproductive: Surgical changes along the prostate. Other: No free air free fluid. There is midline anterior epigastric midline hernia involving small bowel. Musculoskeletal: Curvature and degenerative changes along the spine. Degenerative changes of the pelvis. IMPRESSION: Significant increase in size of soft tissue mass involving the distal stomach and proximal duodenum with the extension of the abnormal soft tissue extending into the porta hepatis. Additional adjacent small lymph nodes in the adjacent mesentery are suspicious. Stable prominent right hilar lymph node and small mediastinal nodes. These similar to previous. Increasing size cystic pancreatic lesion centrally with the ductal dilatation. Please correlate for a cystic pancreatic neoplasm. Electronically Signed   By: Karen Kays M.D.   On: 04/05/2023 11:51

## 2023-04-18 NOTE — Assessment & Plan Note (Addendum)
diagnosed 02/2021 by endo-/colonoscopy for weight loss and anemia. Treatment delayed due to his stroke on 04/18/21. S/p 8 cycles neoadjuvant Keytruda 05/11/21 - 09/28/21. He tolerated well with mild fatigue.  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 -He began FOLFOX on 11/24/21. He has tolerated well overall with some taste changes and mild fatigue -restaging CT scan 02/20/2022 showed PR -restaging CT from 05/15/2022 showed improved masslike enlargement of the duodenal as well as fold nodularity and thickening along the stomach, however it showed a new liver lesion, indeterminate, will obtain liver MRI  -Patient complained of worsening fatigue, last cycle chemo was held on 05/18/22. -Liver MRI in March 2024 showed benign liver lesions and a cystic lesion in pancreas, no evidence of liver metastasis.   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  No evidence of distant metastasis. 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 -Treatment changed to maintenance irrinotecan alone on  12/21/2022. He is tolerating better  -CT CAP from 04/04/2023 showed significant increase in size of soft tissue mass involving the  distal stomach and proximal duodenum. There is extension of abnormal soft tissue into the porta hepatis. There are small lymph nodes in the adjacent mesentery which are suspicious. The pancreatic cystic mass has increased in size with presence of ductal dilation. 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  -Restart 5-FU bolus and pump along with irinotecan.   -04/19/2023 -patient tolerating restarting of FOLFIRI well overall.  Moderate fatigue but manageable.  Proceed with cycle 14 day 1 today.

## 2023-04-19 ENCOUNTER — Inpatient Hospital Stay: Payer: Medicare Other

## 2023-04-19 ENCOUNTER — Inpatient Hospital Stay: Payer: Medicare Other | Admitting: Nurse Practitioner

## 2023-04-19 VITALS — Resp 18

## 2023-04-19 VITALS — BP 128/56 | HR 77 | Temp 97.7°F | Ht 66.0 in | Wt 211.0 lb

## 2023-04-19 DIAGNOSIS — C170 Malignant neoplasm of duodenum: Secondary | ICD-10-CM

## 2023-04-19 DIAGNOSIS — Z95828 Presence of other vascular implants and grafts: Secondary | ICD-10-CM

## 2023-04-19 DIAGNOSIS — Z7982 Long term (current) use of aspirin: Secondary | ICD-10-CM | POA: Diagnosis not present

## 2023-04-19 DIAGNOSIS — Z5111 Encounter for antineoplastic chemotherapy: Secondary | ICD-10-CM | POA: Diagnosis not present

## 2023-04-19 DIAGNOSIS — Z5189 Encounter for other specified aftercare: Secondary | ICD-10-CM | POA: Diagnosis not present

## 2023-04-19 DIAGNOSIS — D500 Iron deficiency anemia secondary to blood loss (chronic): Secondary | ICD-10-CM

## 2023-04-19 DIAGNOSIS — Z79899 Other long term (current) drug therapy: Secondary | ICD-10-CM | POA: Diagnosis not present

## 2023-04-19 DIAGNOSIS — D508 Other iron deficiency anemias: Secondary | ICD-10-CM | POA: Diagnosis not present

## 2023-04-19 LAB — CBC WITH DIFFERENTIAL (CANCER CENTER ONLY)
Abs Immature Granulocytes: 0.1 10*3/uL — ABNORMAL HIGH (ref 0.00–0.07)
Basophils Absolute: 0 10*3/uL (ref 0.0–0.1)
Basophils Relative: 0 %
Eosinophils Absolute: 0.1 10*3/uL (ref 0.0–0.5)
Eosinophils Relative: 1 %
HCT: 25.7 % — ABNORMAL LOW (ref 39.0–52.0)
Hemoglobin: 8 g/dL — ABNORMAL LOW (ref 13.0–17.0)
Immature Granulocytes: 1 %
Lymphocytes Relative: 7 %
Lymphs Abs: 0.7 10*3/uL (ref 0.7–4.0)
MCH: 25.8 pg — ABNORMAL LOW (ref 26.0–34.0)
MCHC: 31.1 g/dL (ref 30.0–36.0)
MCV: 82.9 fL (ref 80.0–100.0)
Monocytes Absolute: 0.7 10*3/uL (ref 0.1–1.0)
Monocytes Relative: 7 %
Neutro Abs: 8.4 10*3/uL — ABNORMAL HIGH (ref 1.7–7.7)
Neutrophils Relative %: 84 %
Platelet Count: 128 10*3/uL — ABNORMAL LOW (ref 150–400)
RBC: 3.1 MIL/uL — ABNORMAL LOW (ref 4.22–5.81)
RDW: 22.5 % — ABNORMAL HIGH (ref 11.5–15.5)
WBC Count: 10 10*3/uL (ref 4.0–10.5)
nRBC: 1.4 % — ABNORMAL HIGH (ref 0.0–0.2)

## 2023-04-19 LAB — CMP (CANCER CENTER ONLY)
ALT: 26 U/L (ref 0–44)
AST: 19 U/L (ref 15–41)
Albumin: 2.9 g/dL — ABNORMAL LOW (ref 3.5–5.0)
Alkaline Phosphatase: 112 U/L (ref 38–126)
Anion gap: 4 — ABNORMAL LOW (ref 5–15)
BUN: 12 mg/dL (ref 8–23)
CO2: 29 mmol/L (ref 22–32)
Calcium: 8.3 mg/dL — ABNORMAL LOW (ref 8.9–10.3)
Chloride: 109 mmol/L (ref 98–111)
Creatinine: 0.88 mg/dL (ref 0.61–1.24)
GFR, Estimated: >60 mL/min (ref >60)
Glucose, Bld: 113 mg/dL — ABNORMAL HIGH (ref 70–99)
Potassium: 3.7 mmol/L (ref 3.5–5.1)
Sodium: 142 mmol/L (ref 135–145)
Total Bilirubin: 1 mg/dL (ref 0.0–1.2)
Total Protein: 5.5 g/dL — ABNORMAL LOW (ref 6.5–8.1)

## 2023-04-19 MED ORDER — SODIUM CHLORIDE 0.9 % IV SOLN
2000.0000 mg/m2 | INTRAVENOUS | Status: DC
  Administered 2023-04-19: 4200 mg via INTRAVENOUS
  Filled 2023-04-19: qty 84

## 2023-04-19 MED ORDER — SODIUM CHLORIDE 0.9% FLUSH
10.0000 mL | Freq: Once | INTRAVENOUS | Status: AC
  Administered 2023-04-19: 10 mL

## 2023-04-19 MED ORDER — SODIUM CHLORIDE 0.9 % IV SOLN: Freq: Once | INTRAVENOUS | Status: AC

## 2023-04-19 MED ORDER — HEPARIN SOD (PORK) LOCK FLUSH 100 UNIT/ML IV SOLN: 500.0000 [IU] | Freq: Once | INTRAVENOUS | Status: DC | PRN

## 2023-04-19 MED ORDER — SODIUM CHLORIDE 0.9 % IV SOLN
400.0000 mg/m2 | Freq: Once | INTRAVENOUS | Status: AC
  Administered 2023-04-19: 844 mg via INTRAVENOUS
  Filled 2023-04-19: qty 25

## 2023-04-19 MED ORDER — PALONOSETRON HCL INJECTION 0.25 MG/5ML
0.2500 mg | Freq: Once | INTRAVENOUS | Status: AC
Start: 2023-04-19 — End: 2023-04-19
  Administered 2023-04-19: 0.25 mg via INTRAVENOUS
  Filled 2023-04-19: qty 5

## 2023-04-19 MED ORDER — SODIUM CHLORIDE 0.9 % IV SOLN
100.0000 mg/m2 | Freq: Once | INTRAVENOUS | Status: AC
  Administered 2023-04-19: 200 mg via INTRAVENOUS
  Filled 2023-04-19: qty 10

## 2023-04-19 MED ORDER — ATROPINE SULFATE 1 MG/ML IV SOLN
0.5000 mg | Freq: Once | INTRAVENOUS | Status: AC | PRN
  Administered 2023-04-19: 0.5 mg via INTRAVENOUS
  Filled 2023-04-19: qty 1

## 2023-04-19 MED ORDER — DEXAMETHASONE SODIUM PHOSPHATE 10 MG/ML IJ SOLN
10.0000 mg | Freq: Once | INTRAMUSCULAR | Status: AC
  Administered 2023-04-19: 10 mg via INTRAVENOUS
  Filled 2023-04-19: qty 1

## 2023-04-19 MED ORDER — SODIUM CHLORIDE 0.9% FLUSH
10.0000 mL | INTRAVENOUS | Status: DC | PRN
  Administered 2023-04-19: 10 mL

## 2023-04-19 NOTE — Patient Instructions (Signed)
[facility] - A DEPT OF [facility]  Discharge Instructions: Thank you for choosing [facility] to provide your oncology and hematology care.   If you have a lab appointment with the Cancer Center, please go directly to the Cancer Center and check in at the registration area.   Wear comfortable clothing and clothing appropriate for easy access to any Portacath or PICC line.   We strive to give you quality time with your provider. You may need to reschedule your appointment if you arrive late (15 or more minutes).  Arriving late affects you and other patients whose appointments are after yours.  Also, if you miss three or more appointments without notifying the office, you may be dismissed from the clinic at the provider's discretion.      For prescription refill requests, have your pharmacy contact our office and allow 72 hours for refills to be completed.    Today you received the following chemotherapy and/or immunotherapy agents: Irinotecan, Leucovorin, 5FU      To help prevent nausea and vomiting after your treatment, we encourage you to take your nausea medication as directed.  BELOW ARE SYMPTOMS THAT SHOULD BE REPORTED IMMEDIATELY: *FEVER GREATER THAN 100.4 F (38 C) OR HIGHER *CHILLS OR SWEATING *NAUSEA AND VOMITING THAT IS NOT CONTROLLED WITH YOUR NAUSEA MEDICATION *UNUSUAL SHORTNESS OF BREATH *UNUSUAL BRUISING OR BLEEDING *URINARY PROBLEMS (pain or burning when urinating, or frequent urination) *BOWEL PROBLEMS (unusual diarrhea, constipation, pain near the anus) TENDERNESS IN MOUTH AND THROAT WITH OR WITHOUT PRESENCE OF ULCERS (sore throat, sores in mouth, or a toothache) UNUSUAL RASH, SWELLING OR PAIN  UNUSUAL VAGINAL DISCHARGE OR ITCHING   Items with * indicate a potential emergency and should be followed up as soon as possible or go to the Emergency Department if any problems should occur.  Please show the CHEMOTHERAPY ALERT CARD  or IMMUNOTHERAPY ALERT CARD at check-in to the Emergency Department and triage nurse.  Should you have questions after your visit or need to cancel or reschedule your appointment, please contact [facility] - A DEPT OF [facility]  Dept: [phone]  and follow the prompts.  Office hours are 8:00 a.m. to 4:30 p.m. Monday - Friday. Please note that voicemails left after 4:00 p.m. may not be returned until the following business day.  We are closed weekends and major holidays. You have access to a nurse at all times for urgent questions. Please call the main number to the clinic Dept: [phone] and follow the prompts.   For any non-urgent questions, you may also contact your provider using MyChart. We now offer e-Visits for anyone 50 and older to request care online for non-urgent symptoms. For details visit [email or web address].   Also download the MyChart app! Go to the app store, search "MyChart", open the app, select [facility], and log in with your MyChart username and password.

## 2023-04-21 ENCOUNTER — Inpatient Hospital Stay: Payer: Medicare Other | Attending: Nurse Practitioner

## 2023-04-21 VITALS — BP 126/55 | HR 87 | Temp 97.2°F | Resp 17

## 2023-04-21 DIAGNOSIS — Z79899 Other long term (current) drug therapy: Secondary | ICD-10-CM | POA: Insufficient documentation

## 2023-04-21 DIAGNOSIS — C170 Malignant neoplasm of duodenum: Secondary | ICD-10-CM | POA: Diagnosis not present

## 2023-04-21 DIAGNOSIS — Z5189 Encounter for other specified aftercare: Secondary | ICD-10-CM | POA: Insufficient documentation

## 2023-04-21 DIAGNOSIS — Z7982 Long term (current) use of aspirin: Secondary | ICD-10-CM | POA: Diagnosis not present

## 2023-04-21 DIAGNOSIS — Z5111 Encounter for antineoplastic chemotherapy: Secondary | ICD-10-CM | POA: Diagnosis not present

## 2023-04-21 DIAGNOSIS — D508 Other iron deficiency anemias: Secondary | ICD-10-CM | POA: Insufficient documentation

## 2023-04-21 DIAGNOSIS — G620 Drug-induced polyneuropathy: Secondary | ICD-10-CM | POA: Diagnosis not present

## 2023-04-21 MED ORDER — PEGFILGRASTIM-CBQV 6 MG/0.6ML ~~LOC~~ SOSY
6.0000 mg | PREFILLED_SYRINGE | Freq: Once | SUBCUTANEOUS | Status: AC
  Administered 2023-04-21: 6 mg via SUBCUTANEOUS

## 2023-04-21 MED ORDER — SODIUM CHLORIDE 0.9% FLUSH
10.0000 mL | INTRAVENOUS | Status: DC | PRN
  Administered 2023-04-21: 10 mL

## 2023-04-21 MED ORDER — HEPARIN SOD (PORK) LOCK FLUSH 100 UNIT/ML IV SOLN
500.0000 [IU] | Freq: Once | INTRAVENOUS | Status: AC | PRN
  Administered 2023-04-21: 500 [IU]

## 2023-04-22 ENCOUNTER — Encounter: Payer: Self-pay | Admitting: Nurse Practitioner

## 2023-04-22 ENCOUNTER — Other Ambulatory Visit: Payer: Self-pay | Admitting: Internal Medicine

## 2023-04-22 ENCOUNTER — Encounter: Payer: Self-pay | Admitting: Hematology

## 2023-04-23 ENCOUNTER — Other Ambulatory Visit: Payer: Self-pay

## 2023-04-27 ENCOUNTER — Other Ambulatory Visit: Payer: Self-pay | Admitting: Cardiology

## 2023-05-01 ENCOUNTER — Ambulatory Visit (INDEPENDENT_AMBULATORY_CARE_PROVIDER_SITE_OTHER): Payer: Medicare Other | Admitting: Internal Medicine

## 2023-05-01 ENCOUNTER — Encounter: Payer: Self-pay | Admitting: Internal Medicine

## 2023-05-01 VITALS — BP 120/48 | HR 88 | Temp 98.2°F | Ht 66.0 in | Wt 216.0 lb

## 2023-05-01 DIAGNOSIS — E559 Vitamin D deficiency, unspecified: Secondary | ICD-10-CM

## 2023-05-01 DIAGNOSIS — I10 Essential (primary) hypertension: Secondary | ICD-10-CM

## 2023-05-01 DIAGNOSIS — Z125 Encounter for screening for malignant neoplasm of prostate: Secondary | ICD-10-CM | POA: Diagnosis not present

## 2023-05-01 DIAGNOSIS — Z0001 Encounter for general adult medical examination with abnormal findings: Secondary | ICD-10-CM | POA: Diagnosis not present

## 2023-05-01 DIAGNOSIS — R739 Hyperglycemia, unspecified: Secondary | ICD-10-CM

## 2023-05-01 DIAGNOSIS — D509 Iron deficiency anemia, unspecified: Secondary | ICD-10-CM | POA: Diagnosis not present

## 2023-05-01 DIAGNOSIS — E538 Deficiency of other specified B group vitamins: Secondary | ICD-10-CM

## 2023-05-01 DIAGNOSIS — E876 Hypokalemia: Secondary | ICD-10-CM | POA: Diagnosis not present

## 2023-05-01 DIAGNOSIS — E7800 Pure hypercholesterolemia, unspecified: Secondary | ICD-10-CM | POA: Diagnosis not present

## 2023-05-01 MED ORDER — POTASSIUM CHLORIDE ER 10 MEQ PO TBCR: EXTENDED_RELEASE_TABLET | ORAL | 11 refills | Status: DC

## 2023-05-01 NOTE — Patient Instructions (Addendum)
Please have your Shingrix (shingles) shots done at your local pharmacy.  Ok to change the potassium 20 pills to the 10 pills - and a new prescription was sent  Ok to take the miralax to help with the constipation  Please continue all other medications as before, and refills have been done if requested.  Please have the pharmacy call with any other refills you may need.  Please continue your efforts at being more active, low cholesterol diet, and weight control.  You are otherwise up to date with prevention measures today.  Please keep your appointments with your specialists as you may have planned\  Please go to the LAB at the blood drawing area for the tests to be done  You will be contacted by phone if any changes need to be made immediately.  Otherwise, you will receive a letter about your results with an explanation, but please check with MyChart first.  Please make an Appointment to return in 6 months, or sooner if needed

## 2023-05-01 NOTE — Progress Notes (Unsigned)
Patient ID: Aaron Moss, male   DOB: [birth year], 78 y.o.   MRN: [id number]         Chief Complaint:: wellness exam and iron def anemia, hypokalemia, hyperglycemia, hld, low vit d       HPI:  Aaron Moss is a 78 y.o. male here for wellness exam; for shingrix at pharmacy, decliens covid booster, o/w up to date                        Also no overt bleeding or bruising.  Very difficult to take the klor con 20 meq tablets due to size, has hard time swallowing.  Pt denies chest pain, increased sob or doe, wheezing, orthopnea, PND, increased LE swelling, palpitations, dizziness or syncope.   Pt denies polydipsia, polyuria, or new focal neuro s/s.    Pt denies fever, wt loss, night sweats, loss of appetite, or other constitutional symptoms     Wt Readings from Last 3 Encounters:  05/01/23 216 lb (98 kg)  04/19/23 211 lb (95.7 kg)  04/05/23 211 lb 8 oz (95.9 kg)   BP Readings from Last 3 Encounters:  05/01/23 (!) 120/48  04/21/23 (!) 126/55  04/19/23 (!) 128/56   Immunization History  Administered Date(s) Administered   Fluad Quad(high Dose 65+) 11/16/2020, 01/31/2022   Influenza Whole 04/30/2009, 04/25/2010   Influenza, High Dose Seasonal PF 12/05/2016, 02/17/2018, 12/12/2018, 12/01/2022   Influenza,inj,Quad PF,6+ Mos 03/05/2013, 04/27/2014, 11/30/2015   Influenza-Unspecified 11/19/2014, 12/26/2019   PFIZER(Purple Top)SARS-COV-2 Vaccination 04/10/2019, 04/28/2019, 12/26/2019, 10/01/2020, 12/20/2020   Pneumococcal Conjugate-13 03/26/2013   Pneumococcal Polysaccharide-23 06/08/2009   Td 03/21/2003   Tdap 05/28/2015, 06/21/2020   Zoster, Live 02/19/2006   Health Maintenance Due  Topic Date Due   Zoster Vaccines- Shingrix (1 of 2) 12/12/1962      Past Medical History:  Diagnosis Date   Anemia 10/31/2021   Dx IDA   Carotid stenosis, left    Colon cancer (HCC) 2000   Coronary artery disease    per Dr. 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   GERD (gastroesophageal reflux disease)    Glaucoma    History of chemotherapy 2000   colon cancer   History of radiation therapy 08/06/13- 10/03/13   prostate 7800 cGy 40 sessions, seminal vesicles 5600 cGy 40 sessions   Hyperlipidemia    Hypertension    Left knee DJD    Prostate cancer (HCC) 05/23/2013   gleason 3+4=7, volume 11.5 ml   Stroke (HCC) 04/18/2021   TIA (transient ischemic attack)    Past Surgical History:  Procedure Laterality Date   CHOLECYSTECTOMY N/A 10/19/2021   Procedure: CHOLECYSTECTOMY;  Surgeon: Almond Lint, MD;  Location: [facility] OR;  Service: General;  Laterality: N/A;   COLON SURGERY  2000   colon cancer   COLONOSCOPY     ENDARTERECTOMY Left 04/25/2021   Procedure: LEFT ENDARTERECTOMY CAROTID;  Surgeon: Cephus Shelling, MD;  Location: [facility] OR;  Service: Vascular;  Laterality: Left;   EUS     pancreatic cyst   KNEE ARTHROSCOPY  2007   LT- [facility]   LAPAROSCOPY N/A 10/19/2021   Procedure: LAPAROSCOPY DIAGNOSTIC;  Surgeon: Almond Lint, MD;  Location: [facility] OR;  Service: General;  Laterality: N/A;  GENERAL AND TAP BLOCK   PATCH ANGIOPLASTY Left 04/25/2021   Procedure: PATCH ANGIOPLASTY XENOSURE 1CM X 6CM;  Surgeon: Cephus Shelling, MD;  Location: [facility] OR;  Service: Vascular;  Laterality: Left;   PORTACATH PLACEMENT Left 11/17/2021   Procedure: PORT PLACEMENT;  Surgeon: Almond Lint, MD;  Location: [facility] OR;  Service: General;  Laterality: Left;   PROSTATE BIOPSY  05/23/13   gleason 7, volume 11.5 ml   WHIPPLE PROCEDURE N/A 10/19/2021   Procedure: ATTEMPTED WHIPPLE PROCEDURE  WITH GASTRODUODENAL BYPASS;  Surgeon: Almond Lint, MD;  Location: [facility] OR;  Service: General;  Laterality: N/A;  GENERAL AND TAP BLOCK    reports that he quit smoking about 35 years ago. His smoking use included cigarettes. He started smoking about 57 years ago. He has a 22 pack-year smoking history. He has never used smokeless tobacco. He reports that he does not currently use  alcohol. He reports that he does not use drugs. family history includes Breast cancer in his maternal grandmother; Cancer in his brother, maternal aunt, and mother; Cancer (age of onset: 58) in his daughter; Colon cancer (age of onset: 79) in his sister; Diabetes in his brother. Allergies  Allergen Reactions   Lyrica [Pregabalin] Other (See Comments)    "Just made me feel bad"   Ciprofloxacin Itching and Rash   Current Outpatient Medications on File Prior to Visit  Medication Sig Dispense Refill   amLODipine (NORVASC) 10 MG tablet TAKE 1 TABLET BY MOUTH ONCE DAILY 90 tablet 3   aspirin EC 81 MG EC tablet Take 1 tablet (81 mg total) by mouth daily. Swallow whole. 30 tablet 11   atorvastatin (LIPITOR) 40 MG tablet Take 1 tablet by mouth once daily 90 tablet 0   b complex vitamins capsule Take 1 capsule by mouth daily.     benazepril (LOTENSIN) 40 MG tablet Take 1 tablet by mouth once daily 90 tablet 2   carvedilol (COREG) 12.5 MG tablet TAKE 1 TABLET BY MOUTH TWICE A DAY 60 tablet 0   cyanocobalamin (VITAMIN B12) 500 MCG tablet Take 500 mcg by mouth daily.     dexamethasone (DECADRON) 4 MG tablet Take 1 tablet (4 mg total) by mouth daily. Starting the day after chemo, take for 3 to 5 days 20 tablet 0   diphenoxylate-atropine (LOMOTIL) 2.5-0.025 MG tablet Take 1-2 tablets by mouth 4 (four) times daily as needed for diarrhea or loose stools. 20 tablet 0   DULoxetine (CYMBALTA) 30 MG capsule Take 1 capsule (30 mg total) by mouth daily. 55 capsule 1   iron polysaccharides (NIFEREX) 150 MG capsule Take 1 capsule (150 mg total) by mouth 2 (two) times daily. 60 capsule 2   potassium chloride SA (KLOR-CON M20) 20 MEQ tablet Take 1 tablet (20 mEq total) by mouth 3 (three) times daily. 90 tablet 2   prochlorperazine (COMPAZINE) 10 MG tablet Take 1 tablet (10 mg total) by mouth every 6 (six) hours as needed for nausea or vomiting (Use for nausea and / or vomiting unresolved with ondansetron (Zofran).). 30  tablet 0   traMADol (ULTRAM) 50 MG tablet TAKE 1 TABLET BY MOUTH EVERY 12 HOURS AS NEEDED 60 tablet 2   Travoprost, BAK Free, (TRAVATAN) 0.004 % SOLN ophthalmic solution Place 1 drop into both eyes at bedtime.     VITAMIN D PO Take 2 tablets by mouth daily. Gummies     No current facility-administered medications on file prior to visit.        ROS:  All others reviewed and negative.  Objective        PE:  BP (!) 120/48 (BP Location: Right Arm, Patient Position: Sitting, Cuff Size:  Normal)   Pulse 88   Temp 98.2 F (36.8 C) (Oral)   Ht 5\' 6"  (1.676 m)   Wt 216 lb (98 kg)   SpO2 99%   BMI 34.86 kg/m                 Constitutional: Pt appears in NAD               HENT: Head: NCAT.                Right Ear: External ear normal.                 Left Ear: External ear normal.                Eyes: . Pupils are equal, round, and reactive to light. Conjunctivae and EOM are normal               Nose: without d/c or deformity               Neck: Neck supple. Gross normal ROM               Cardiovascular: Normal rate and regular rhythm.                 Pulmonary/Chest: Effort normal and breath sounds without rales or wheezing.                Abd:  Soft, NT, ND, + BS, no organomegaly               Neurological: Pt is alert. At baseline orientation, motor grossly intact               Skin: Skin is warm. No rashes, no other new lesions, LE edema - none               Psychiatric: Pt behavior is normal without agitation   Micro: none  Cardiac tracings I have personally interpreted today:  none  Pertinent Radiological findings (summarize): none   Lab Results  Component Value Date   WBC 10.0 04/19/2023   HGB 8.0 (L) 04/19/2023   HCT 25.7 (L) 04/19/2023   PLT 128 (L) 04/19/2023   GLUCOSE 113 (H) 04/19/2023   CHOL 134 05/22/2022   TRIG 157.0 (H) 05/22/2022   HDL 38.90 (L) 05/22/2022   LDLDIRECT 185.6 05/15/2007   LDLCALC 63 05/22/2022   ALT 26 04/19/2023   AST 19 04/19/2023   NA 142  04/19/2023   K 3.7 04/19/2023   CL 109 04/19/2023   CREATININE 0.88 04/19/2023   BUN 12 04/19/2023   CO2 29 04/19/2023   TSH 1.73 05/22/2022   PSA 0.12 05/22/2022   INR 1.2 10/22/2021   HGBA1C 5.7 05/22/2022   MICROALBUR 1.3 05/22/2022   Assessment/Plan:  Aaron Moss is a 78 y.o. Black or African American [2] male with  has a past medical history of Anemia (10/31/2021), Carotid stenosis, left, Colon cancer (HCC) (2000), Coronary artery disease, Diverticulosis of colon, Duodenal cancer (HCC) (01/2021), GERD (gastroesophageal reflux disease), Glaucoma, History of chemotherapy (2000), History of radiation therapy (08/06/13- 10/03/13), Hyperlipidemia, Hypertension, Left knee DJD, Prostate cancer (HCC) (05/23/2013), Stroke (HCC) (04/18/2021), and TIA (transient ischemic attack).  Encounter for well adult exam with abnormal findings Age and sex appropriate education and counseling updated with regular exercise and diet Referrals for preventative services - none needed Immunizations addressed - for shingrix at pharmacy, declines covid booster Smoking counseling  - none needed Evidence for  depression or other mood disorder - none significant Most recent labs reviewed. I have personally reviewed and have noted: 1) the patient's medical and social history 2) The patient's current medications and supplements 3) The patient's height, weight, and BMI have been recorded in the chart   Hyperglycemia Lab Results  Component Value Date   HGBA1C 5.7 05/22/2022   Stable, pt to continue current medical treatment  - diet, wt control   Hyperlipidemia Lab Results  Component Value Date   LDLCALC 63 05/22/2022   Stable, pt to continue current statin lipitor 40 qd   Hypertension, uncontrolled BP Readings from Last 3 Encounters:  05/01/23 (!) 120/48  04/21/23 (!) 126/55  04/19/23 (!) 128/56   Stable, pt to continue medical treatment norvasc 10 every day, lotensin 40 every day. Coreg 12.5  bid   Hypokalemia Chronic persistent but unable to tolerate the klor con 20 meq tabs - ok to change to the 10 meq tabs  Vitamin D deficiency Last vitamin D Lab Results  Component Value Date   VD25OH 33.75 05/22/2022   Low, to start oral replacement   Iron deficiency anemia No recent bleeding, bruising, for f/u iron lab , cbc  Followup: Return in about 6 months (around 10/29/2023).  Oliver Barre, MD 05/02/2023 8:33 PM [facility]

## 2023-05-02 ENCOUNTER — Encounter: Payer: Self-pay | Admitting: Internal Medicine

## 2023-05-02 NOTE — Assessment & Plan Note (Signed)
Last vitamin D Lab Results  Component Value Date   VD25OH 33.75 05/22/2022   Low, to start oral replacement

## 2023-05-02 NOTE — Assessment & Plan Note (Signed)
Lab Results  Component Value Date   LDLCALC 63 05/22/2022   Stable, pt to continue current statin lipitor 40 qd

## 2023-05-02 NOTE — Assessment & Plan Note (Signed)
Age and sex appropriate education and counseling updated with regular exercise and diet Referrals for preventative services - none needed Immunizations addressed - for shingrix at pharmacy, declines covid booster Smoking counseling  - none needed Evidence for depression or other mood disorder - none significant Most recent labs reviewed. I have personally reviewed and have noted: 1) the patient's medical and social history 2) The patient's current medications and supplements 3) The patient's height, weight, and BMI have been recorded in the chart

## 2023-05-02 NOTE — Assessment & Plan Note (Signed)
No recent bleeding, bruising, for f/u iron lab , cbc

## 2023-05-02 NOTE — Assessment & Plan Note (Signed)
Lab Results  Component Value Date   HGBA1C 5.7 05/22/2022   Stable, pt to continue current medical treatment  - diet, wt control

## 2023-05-02 NOTE — Assessment & Plan Note (Signed)
Chronic persistent but unable to tolerate the klor con 20 meq tabs - ok to change to the 10 meq tabs

## 2023-05-02 NOTE — Assessment & Plan Note (Signed)
BP Readings from Last 3 Encounters:  05/01/23 (!) 120/48  04/21/23 (!) 126/55  04/19/23 (!) 128/56   Stable, pt to continue medical treatment norvasc 10 every day, lotensin 40 every day. Coreg 12.5 bid

## 2023-05-03 ENCOUNTER — Other Ambulatory Visit: Payer: Self-pay | Admitting: *Deleted

## 2023-05-03 ENCOUNTER — Other Ambulatory Visit: Payer: Self-pay

## 2023-05-03 ENCOUNTER — Encounter: Payer: Self-pay | Admitting: Hematology

## 2023-05-03 ENCOUNTER — Inpatient Hospital Stay (HOSPITAL_BASED_OUTPATIENT_CLINIC_OR_DEPARTMENT_OTHER): Payer: Medicare Other | Admitting: Hematology

## 2023-05-03 ENCOUNTER — Inpatient Hospital Stay: Payer: Medicare Other

## 2023-05-03 ENCOUNTER — Inpatient Hospital Stay: Payer: Medicare Other | Admitting: Dietician

## 2023-05-03 VITALS — BP 137/44 | HR 93 | Temp 97.9°F | Resp 18 | Wt 214.9 lb

## 2023-05-03 VITALS — BP 134/57 | HR 74 | Temp 98.5°F | Resp 18

## 2023-05-03 DIAGNOSIS — C170 Malignant neoplasm of duodenum: Secondary | ICD-10-CM | POA: Diagnosis not present

## 2023-05-03 DIAGNOSIS — D508 Other iron deficiency anemias: Secondary | ICD-10-CM | POA: Diagnosis not present

## 2023-05-03 DIAGNOSIS — Z95828 Presence of other vascular implants and grafts: Secondary | ICD-10-CM

## 2023-05-03 DIAGNOSIS — D500 Iron deficiency anemia secondary to blood loss (chronic): Secondary | ICD-10-CM

## 2023-05-03 DIAGNOSIS — Z5111 Encounter for antineoplastic chemotherapy: Secondary | ICD-10-CM | POA: Diagnosis not present

## 2023-05-03 DIAGNOSIS — Z7982 Long term (current) use of aspirin: Secondary | ICD-10-CM | POA: Diagnosis not present

## 2023-05-03 DIAGNOSIS — G620 Drug-induced polyneuropathy: Secondary | ICD-10-CM | POA: Diagnosis not present

## 2023-05-03 DIAGNOSIS — Z79899 Other long term (current) drug therapy: Secondary | ICD-10-CM | POA: Diagnosis not present

## 2023-05-03 DIAGNOSIS — Z5189 Encounter for other specified aftercare: Secondary | ICD-10-CM | POA: Diagnosis not present

## 2023-05-03 LAB — CBC WITH DIFFERENTIAL (CANCER CENTER ONLY)
Abs Immature Granulocytes: 0.1 10*3/uL — ABNORMAL HIGH (ref 0.00–0.07)
Basophils Absolute: 0 10*3/uL (ref 0.0–0.1)
Basophils Relative: 0 %
Eosinophils Absolute: 0.2 10*3/uL (ref 0.0–0.5)
Eosinophils Relative: 1 %
HCT: 25.6 % — ABNORMAL LOW (ref 39.0–52.0)
Hemoglobin: 7.8 g/dL — ABNORMAL LOW (ref 13.0–17.0)
Immature Granulocytes: 1 %
Lymphocytes Relative: 5 %
Lymphs Abs: 0.5 10*3/uL — ABNORMAL LOW (ref 0.7–4.0)
MCH: 25.6 pg — ABNORMAL LOW (ref 26.0–34.0)
MCHC: 30.5 g/dL (ref 30.0–36.0)
MCV: 83.9 fL (ref 80.0–100.0)
Monocytes Absolute: 0.7 10*3/uL (ref 0.1–1.0)
Monocytes Relative: 7 %
Neutro Abs: 9.5 10*3/uL — ABNORMAL HIGH (ref 1.7–7.7)
Neutrophils Relative %: 86 %
Platelet Count: 169 10*3/uL (ref 150–400)
RBC: 3.05 MIL/uL — ABNORMAL LOW (ref 4.22–5.81)
RDW: 24.2 % — ABNORMAL HIGH (ref 11.5–15.5)
WBC Count: 11 10*3/uL — ABNORMAL HIGH (ref 4.0–10.5)
nRBC: 1.1 % — ABNORMAL HIGH (ref 0.0–0.2)

## 2023-05-03 LAB — CMP (CANCER CENTER ONLY)
ALT: 27 U/L (ref 0–44)
AST: 19 U/L (ref 15–41)
Albumin: 2.8 g/dL — ABNORMAL LOW (ref 3.5–5.0)
Alkaline Phosphatase: 101 U/L (ref 38–126)
Anion gap: 3 — ABNORMAL LOW (ref 5–15)
BUN: 13 mg/dL (ref 8–23)
CO2: 27 mmol/L (ref 22–32)
Calcium: 8.1 mg/dL — ABNORMAL LOW (ref 8.9–10.3)
Chloride: 111 mmol/L (ref 98–111)
Creatinine: 0.87 mg/dL (ref 0.61–1.24)
GFR, Estimated: >60 mL/min (ref >60)
Glucose, Bld: 160 mg/dL — ABNORMAL HIGH (ref 70–99)
Potassium: 3.9 mmol/L (ref 3.5–5.1)
Sodium: 141 mmol/L (ref 135–145)
Total Bilirubin: 1.4 mg/dL — ABNORMAL HIGH (ref 0.0–1.2)
Total Protein: 5.4 g/dL — ABNORMAL LOW (ref 6.5–8.1)

## 2023-05-03 LAB — PREPARE RBC (CROSSMATCH)

## 2023-05-03 LAB — IRON AND IRON BINDING CAPACITY (CC-WL,HP ONLY)
Iron: 16 ug/dL — ABNORMAL LOW (ref 45–182)
Saturation Ratios: 9 % — ABNORMAL LOW (ref 17.9–39.5)
TIBC: 169 ug/dL — ABNORMAL LOW (ref 250–450)
UIBC: 153 ug/dL

## 2023-05-03 LAB — CEA (ACCESS): CEA (CHCC): 6.9 ng/mL — ABNORMAL HIGH (ref 0.00–5.00)

## 2023-05-03 MED ORDER — SODIUM CHLORIDE 0.9 % IV SOLN: Freq: Once | INTRAVENOUS | Status: AC

## 2023-05-03 MED ORDER — SODIUM CHLORIDE 0.9 % IV SOLN
400.0000 mg/m2 | Freq: Once | INTRAVENOUS | Status: AC
  Administered 2023-05-03: 844 mg via INTRAVENOUS
  Filled 2023-05-03: qty 42.2

## 2023-05-03 MED ORDER — HEPARIN SOD (PORK) LOCK FLUSH 100 UNIT/ML IV SOLN: 500.0000 [IU] | Freq: Once | INTRAVENOUS | Status: DC | PRN

## 2023-05-03 MED ORDER — ATROPINE SULFATE 1 MG/ML IV SOLN
0.5000 mg | Freq: Once | INTRAVENOUS | Status: AC | PRN
  Administered 2023-05-03: 0.5 mg via INTRAVENOUS
  Filled 2023-05-03: qty 1

## 2023-05-03 MED ORDER — SODIUM CHLORIDE 0.9% FLUSH
10.0000 mL | Freq: Once | INTRAVENOUS | Status: AC
  Administered 2023-05-03: 10 mL

## 2023-05-03 MED ORDER — SODIUM CHLORIDE 0.9 % IV SOLN
1800.0000 mg/m2 | INTRAVENOUS | Status: DC
  Administered 2023-05-03: 3500 mg via INTRAVENOUS
  Filled 2023-05-03: qty 70

## 2023-05-03 MED ORDER — SODIUM CHLORIDE 0.9% IV SOLUTION
250.0000 mL | INTRAVENOUS | Status: DC
  Administered 2023-05-03: 100 mL via INTRAVENOUS

## 2023-05-03 MED ORDER — PALONOSETRON HCL INJECTION 0.25 MG/5ML
0.2500 mg | Freq: Once | INTRAVENOUS | Status: AC
  Administered 2023-05-03: 0.25 mg via INTRAVENOUS
  Filled 2023-05-03: qty 5

## 2023-05-03 MED ORDER — DEXAMETHASONE SODIUM PHOSPHATE 10 MG/ML IJ SOLN
10.0000 mg | Freq: Once | INTRAMUSCULAR | Status: AC
  Administered 2023-05-03: 10 mg via INTRAVENOUS
  Filled 2023-05-03: qty 1

## 2023-05-03 MED ORDER — SODIUM CHLORIDE 0.9 % IV SOLN
80.0000 mg/m2 | Freq: Once | INTRAVENOUS | Status: AC
  Administered 2023-05-03: 160 mg via INTRAVENOUS
  Filled 2023-05-03: qty 8

## 2023-05-03 MED ORDER — SODIUM CHLORIDE 0.9% FLUSH: 10.0000 mL | INTRAVENOUS | Status: DC | PRN

## 2023-05-03 NOTE — Progress Notes (Signed)
[facility]   [phone] [phone]   Clinic Follow up Note   Patient Care Team: Corwin Levins, MD as PCP - General Jens Som, Madolyn Frieze, MD as PCP - Cardiology (Cardiology) Iva Boop, MD as Consulting Physician (Gastroenterology) Gean Birchwood, MD as Consulting Physician (Orthopedic Surgery) Sallye Lat, MD as Consulting Physician (Ophthalmology) Malachy Mood, MD as Consulting Physician (Oncology)  Date of Service:  05/03/2023  CHIEF COMPLAINT: f/u of duodenal cancer  CURRENT THERAPY:  FOLFIRI  Oncology History   Adenocarcinoma of duodenum (HCC) Unresectable, [facility] -diagnosed 02/2021 by endo-/colonoscopy for weight loss and anemia. Treatment delayed due to his stroke on 04/18/21. S/p 8 cycles neoadjuvant Keytruda 05/11/21 - 09/28/21. He tolerated well with mild fatigue.  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 -He began FOLFOX on 11/24/21. He has tolerated well overall with some taste changes and mild fatigue -restaging CT scan 02/20/2022 showed PR -restaging CT from 05/15/2022 showed improved masslike enlargement of the duodenal as well as fold nodularity and thickening along the stomach, however it showed a new liver lesion, indeterminate, will obtain liver MRI  -Patient complained of worsening fatigue, last cycle chemo was held on 05/18/22. -Liver MRI in March 2024 showed benign liver lesions and a cystic lesion in pancreas, no evidence of liver metastasis.   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  No evidence of distant metastasis. -Due to the cancer progression, and his persistent peripheral neuropathy, I recommend  change treatment to second line FOLFIRI, he started on 09/07/22, he is tolerating well so far  -Due to his moderate fatigue, I reduced irinotecan dose slightly from C3 -His performance status has been low due to significant fatigue, required chemo dose reduction and chemo break  --Restaging CT scan from December 04, 2022 showed excellent response, the primary duodenal mass is not really measurable, no other signs of metastasis. -I changed his treatment to maintenance irrinotecan alone on  12/21/2022. He is tolerating better -due to mild disease progression, I changed his chemo back to FOLFIRI on 04/05/2023    Assessment and Plan   Duodenal adenocarcinoma -On palliative chemotherapy with dose reduction -Will further reduce chemotherapy dose due to his fatigue and anemia -Will proceed to chemo today and continue every 2 weeks -We also discussed alternative therapy with immunotherapy nivolumab and ipilimumab, he previously did not respond to [facility].  Anemia and cancer-related Fatigue Persistent fatigue, worsened since last chemotherapy. Likely multifactorial, related to both cancer, anemia and chemotherapy regimen. Discussed reducing chemotherapy dose to improve tolerance. If fatigue persists, consider switching to a different immunotherapy regimen, which has less fatigue and other side effects but can cause severe side effects in less than 10% of patients. - Reduce chemotherapy dose by 20% for one drug and 10% for another - Monitor functional status at home - Consider switching to a different immunotherapy regimen if fatigue persists  Anemia Hemoglobin decreased to 7.8 g/dL from previous levels of 11-12 g/dL, contributing to fatigue. No reported bleeding or melena. Likely related to chemotherapy. Discussed need for blood transfusion and potential IV iron infusion if iron levels are low. - Order blood transfusion of two units - Draw labs for iron studies and type and cross for blood  transfusion - Consider IV iron infusion if iron levels are low  Chemotherapy-induced Peripheral Neuropathy Numbness and tingling in feet, consistent with peripheral neuropathy. Hands have shown improvement, but feet remain symptomatic. - Consider dose adjustment or alternative therapies if symptoms worsen  General Health Maintenance Gained four pounds since last year, indicating good appetite and nutritional status. - Continue monitoring weight and nutritional intake  Follow-up -Lab reviewed, adequate for treatment, will proceed to chemotherapy today with dose reduction, and continue every 2 weeks -Will check iron study to see if he needs IV iron -Will arrange 1 unit of blood transfusion in next few days -Follow-up in 2 weeks before next cycle chemo       SUMMARY OF ONCOLOGIC HISTORY: Oncology History Overview Note   Cancer Staging  Adenocarcinoma of duodenum (HCC) Staging form: Small Intestine - Adenocarcinoma, AJCC 8th Edition - Clinical stage from 03/18/2021: Stage IIIA (cTX, cN1, cM0) - Signed by Malachy Mood, MD on 06/22/2021 Stage prefix: Initial diagnosis     Adenocarcinoma of duodenum (HCC)  03/01/2021 Imaging   EXAM: ABDOMEN ULTRASOUND COMPLETE  IMPRESSION: 1. Simple appearing right renal cyst. 2. Otherwise negative abdominal ultrasound   03/18/2021 Procedure   Upper Endoscopy/Colonoscopy, Dr. Myrtie Neither  Upper Endoscopy Impression: - Normal esophagus. - A single submucosal papule (nodule) found in the stomach. - Duodenal mass. Biopsied. Concerning for malignancy.  Colonoscopy Impression: - Diverticulosis in the entire examined colon. - The examined portion of the ileum was normal. - Patent end-to-end colo-colonic anastomosis, characterized by healthy appearing mucosa. - Internal hemorrhoids. - No specimens collected.   03/18/2021 Initial Biopsy   Diagnosis Duodenum, Biopsy - INVASIVE MODERATE TO POORLY DIFFERENTIATED ADENOCARCINOMA   03/18/2021 Cancer  Staging   Staging form: Small Intestine - Adenocarcinoma, AJCC 8th Edition - Clinical stage from 03/18/2021: Stage IIIA (cTX, cN1, cM0) - Signed by Malachy Mood, MD on 06/22/2021 Stage prefix: Initial diagnosis   03/22/2021 Initial Diagnosis   Adenocarcinoma of duodenum (HCC)   04/01/2021 Imaging   EXAM: CT CHEST, ABDOMEN, AND PELVIS WITH CONTRAST  IMPRESSION: 1. Circumferential wall thickening of the first portion of the duodenum, in keeping with known primary malignancy. 2. Enlarged, hypodense lymph nodes anterior to the pancreatic head and in the portacaval station, concerning for nodal metastatic disease. 3. No other evidence of metastatic disease in the chest, abdomen, or pelvis. 4. Incidental note of a fluid attenuation lesion within the proximal pancreatic body measuring 1.9 x 1.4 cm, with prominence of the pancreatic duct distally, up to 0.6 cm. This lesion was present on remote prior examination dated 05/04/2009, and has slightly increased in size over a long period of time. This is consistent with a small IPMN and benign given indolent behavior over greater than 10 years. 5. Background of very fine centrilobular pulmonary nodules, most concentrated in the lung apices, most commonly seen in smoking-related respiratory bronchiolitis. 6. Coronary artery disease.   Aortic Atherosclerosis (ICD10-I70.0).    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 Of note, a variant of uncertain significance was detected in the ATM (p.A869G), BLM (c.800-3T>G), and SDHA (p.V632F) genes. Report date is 04/27/2021.  The CancerNext-Expanded gene panel offered by [facility] and includes sequencing, rearrangement, and RNA analysis for the following 77 genes: AIP, ALK, APC, ATM, AXIN2, BAP1, BARD1, BLM, BMPR1A, BRCA1, BRCA2, BRIP1, CDC73, CDH1, CDK4, CDKN1B, CDKN2A, CHEK2, CTNNA1, DICER1, FANCC, FH,  FLCN, GALNT12, KIF1B, LZTR1, MAX, MEN1, MET,  MLH1, MSH2, MSH3, MSH6, MUTYH, NBN, NF1, NF2, NTHL1, PALB2, PHOX2B, PMS2, POT1, PRKAR1A, PTCH1, PTEN, RAD51C, RAD51D, RB1, RECQL, RET, SDHA, SDHAF2, SDHB, SDHC, SDHD, SMAD4, SMARCA4, SMARCB1, SMARCE1, STK11, SUFU, TMEM127, TP53, TSC1, TSC2, VHL and XRCC2 (sequencing and deletion/duplication); EGFR, EGLN1, HOXB13, KIT, MITF, PDGFRA, POLD1, and POLE (sequencing only); EPCAM and GREM1 (deletion/duplication only).    05/11/2021 - 09/28/2021 Chemotherapy   Patient is on Treatment Plan : COLORECTAL Pembrolizumab (200) q21d     11/24/2021 - 05/03/2022 Chemotherapy   Patient is on Treatment Plan : COLORECTAL FOLFOX q14d x 4 months     02/20/2022 Imaging    IMPRESSION: 1. Distal stomach/duodenal mass is decreased in size when compared with prior exam. 2. Stable upper abdominal adenopathy. 3. 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 4. Aortic Atherosclerosis (ICD10-I70.0).   05/15/2022 Imaging    IMPRESSION: Improved masslike enlargement of the duodenal as well as fold nodularity and thickening along the stomach. In addition there are some small nodes identified in the upper abdomen which are similar overall compared to the study of December 2023   However there is a new spiculated nodule in the middle lobe which is worrisome. In addition there are some small low-attenuation liver lesions which are too small to completely characterize but were not clearly seen on the previous examination. Recommend further evaluation. For the liver lesions in particular a Eovist liver MRI may be of some benefit as clinically directed.   Persistent cystic lesion along the midbody of the pancreas with associated duct dilatation. A cystic neoplasm of the pancreas is possible.   06/06/2022 Imaging   MR Abdomen W Wo Contrast     IMPRESSION: 1. No  definite evidence of hepatic metastatic disease. Scattered punctate foci of T2 hyperintensity throughout the liver are not associated with any suspicious enhancement or restricted diffusion and are favored to reflect small cysts or biliary hamartomas. Some of these are suboptimally characterized based on their small size, and given nonvisualization on older prior studies, attention on follow-up CT in 3-6 months recommended. 2. Unchanged residual irregular thickening of the walls of the distal stomach and proximal duodenum. 3. 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 This lesion is unchanged in size from recent CT, although has slowly enlarged from older prior examinations. Recommend follow-up abdominal MRI in 1 year. 4. Stable mild extrahepatic biliary dilatation status post cholecystectomy. 5. Aortic and branch vessel atherosclerosis, better seen on CT.   09/07/2022 -  Chemotherapy   Patient is on Treatment Plan : COLORECTAL FOLFIRI q14d     04/04/2023 Imaging   CT chest abdomen and pelvis with contrast IMPRESSION: Significant increase in size of soft tissue mass involving the distal stomach and proximal duodenum with the extension of the abnormal soft tissue extending into the porta hepatis. Additional adjacent small lymph nodes in the adjacent mesentery are suspicious. Stable prominent right hilar lymph node and small mediastinal nodes. These similar to previous.   Increasing size cystic pancreatic lesion centrally with the ductal dilatation. Please correlate for a cystic pancreatic neoplasm.        Discussed the use of AI scribe software for clinical note transcription with the patient, who gave verbal consent to proceed.  History of Present Illness   A 78 year old male with a history of cancer presents with increased fatigue and neuropathy in his feet. The  patient reports that his hands are feeling  better, but his feet have not improved. He describes feeling tired and lacking energy, with some days being worse than others. He denies any difficulty getting up from his recliner, where he sleeps, and is able to move around the house. However, he has not been going outside due to the weather. The patient reports that his appetite has been good and he has gained weight. He denies any falls or need for a cane, but admits he needs to be careful and pay attention to his movements. The patient's wife reports concern about his weakness and fatigue. The patient's iron levels and blood count are low, but he denies any bleeding or black stool.         All other systems were reviewed with the patient and are negative.  MEDICAL HISTORY:  Past Medical History:  Diagnosis Date   Anemia 10/31/2021   Dx IDA   Carotid stenosis, left    Colon cancer (HCC) 2000   Coronary artery disease    per Dr. 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, volume 11.5 ml   Stroke (HCC) 04/18/2021   TIA (transient ischemic attack)     SURGICAL HISTORY: Past Surgical History:  Procedure Laterality Date   CHOLECYSTECTOMY N/A 10/19/2021   Procedure: CHOLECYSTECTOMY;  Surgeon: Almond Lint, MD;  Location: [facility] OR;  Service: General;  Laterality: N/A;   COLON SURGERY  2000   colon cancer   COLONOSCOPY     ENDARTERECTOMY Left 04/25/2021   Procedure: LEFT ENDARTERECTOMY CAROTID;  Surgeon: Cephus Shelling, MD;  Location: [facility] OR;  Service: Vascular;  Laterality: Left;   EUS     pancreatic cyst   KNEE ARTHROSCOPY  2007   LT- [facility]   LAPAROSCOPY N/A  10/19/2021   Procedure: LAPAROSCOPY DIAGNOSTIC;  Surgeon: Almond Lint, MD;  Location: [facility] OR;  Service: General;  Laterality: N/A;  GENERAL AND TAP BLOCK   PATCH ANGIOPLASTY Left 04/25/2021   Procedure: PATCH ANGIOPLASTY XENOSURE 1CM X 6CM;  Surgeon: Cephus Shelling, MD;  Location: [facility] OR;  Service: Vascular;  Laterality: Left;   PORTACATH PLACEMENT Left 11/17/2021   Procedure: PORT PLACEMENT;  Surgeon: Almond Lint, MD;  Location: [facility] OR;  Service: General;  Laterality: Left;   PROSTATE BIOPSY  05/23/13   gleason 7, volume 11.5 ml   WHIPPLE PROCEDURE N/A 10/19/2021   Procedure: ATTEMPTED WHIPPLE PROCEDURE  WITH GASTRODUODENAL BYPASS;  Surgeon: Almond Lint, MD;  Location: [facility] OR;  Service: General;  Laterality: N/A;  GENERAL AND TAP BLOCK    I have reviewed the social history and family history with the patient and they are unchanged from previous note.  ALLERGIES:  is allergic to lyrica [pregabalin] and ciprofloxacin.  MEDICATIONS:  Current Outpatient Medications  Medication Sig Dispense Refill   amLODipine (NORVASC) 10 MG tablet TAKE 1 TABLET BY MOUTH ONCE DAILY 90 tablet 3   aspirin EC 81 MG EC tablet Take 1 tablet (81 mg total) by mouth daily. Swallow whole. 30 tablet 11   atorvastatin (LIPITOR)  40 MG tablet Take 1 tablet by mouth once daily 90 tablet 0   b complex vitamins capsule Take 1 capsule by mouth daily.     benazepril (LOTENSIN) 40 MG tablet Take 1 tablet by mouth once daily 90 tablet 2   carvedilol (COREG) 12.5 MG tablet TAKE 1 TABLET BY MOUTH TWICE A DAY 60 tablet 0   cyanocobalamin (VITAMIN B12) 500 MCG tablet Take 500 mcg by mouth daily.     dexamethasone (DECADRON) 4 MG tablet Take 1 tablet (4 mg total) by mouth daily. Starting the day after chemo, take for 3 to 5 days 20 tablet 0   diphenoxylate-atropine (LOMOTIL) 2.5-0.025 MG tablet Take 1-2 tablets by mouth 4 (four) times daily as needed for diarrhea or loose stools. 20 tablet 0   DULoxetine (CYMBALTA) 30 MG capsule Take  1 capsule (30 mg total) by mouth daily. 55 capsule 1   iron polysaccharides (NIFEREX) 150 MG capsule Take 1 capsule (150 mg total) by mouth 2 (two) times daily. 60 capsule 2   potassium chloride (KLOR-CON 10) 10 MEQ tablet 2 tab by mouth three times per day 180 tablet 11   potassium chloride SA (KLOR-CON M20) 20 MEQ tablet Take 1 tablet (20 mEq total) by mouth 3 (three) times daily. 90 tablet 2   prochlorperazine (COMPAZINE) 10 MG tablet Take 1 tablet (10 mg total) by mouth every 6 (six) hours as needed for nausea or vomiting (Use for nausea and / or vomiting unresolved with ondansetron (Zofran).). 30 tablet 0   traMADol (ULTRAM) 50 MG tablet TAKE 1 TABLET BY MOUTH EVERY 12 HOURS AS NEEDED 60 tablet 2   Travoprost, BAK Free, (TRAVATAN) 0.004 % SOLN ophthalmic solution Place 1 drop into both eyes at bedtime.     VITAMIN D PO Take 2 tablets by mouth daily. Gummies     No current facility-administered medications for this visit.   Facility-Administered Medications Ordered in Other Visits  Medication Dose Route Frequency Provider Last Rate Last Admin   fluorouracil (ADRUCIL) 3,500 mg in sodium chloride 0.9 % 80 mL chemo infusion  1,800 mg/m2 (Treatment Plan Recorded) Intravenous 1 day or 1 dose Malachy Mood, MD       heparin lock flush 100 unit/mL  500 Units Intracatheter Once PRN Malachy Mood, MD       irinotecan (CAMPTOSAR) 160 mg in sodium chloride 0.9 % 500 mL chemo infusion  80 mg/m2 (Treatment Plan Recorded) Intravenous Once Malachy Mood, MD 339 mL/hr at 05/03/23 1329 160 mg at 05/03/23 1329   leucovorin 844 mg in sodium chloride 0.9 % 250 mL infusion  400 mg/m2 (Treatment Plan Recorded) Intravenous Once Malachy Mood, MD 195 mL/hr at 05/03/23 1331 844 mg at 05/03/23 1331   sodium chloride flush (NS) 0.9 % injection 10 mL  10 mL Intracatheter PRN Malachy Mood, MD        PHYSICAL EXAMINATION: ECOG PERFORMANCE STATUS: 2 - Symptomatic, <50% confined to bed  Vitals:   05/03/23 1130  BP: (!) 137/44  Pulse: 93   Resp: 18  Temp: 97.9 F (36.6 C)  SpO2: 100%   Wt Readings from Last 3 Encounters:  05/03/23 214 lb 14.4 oz (97.5 kg)  05/01/23 216 lb (98 kg)  04/19/23 211 lb (95.7 kg)     GENERAL:alert, no distress and comfortable SKIN: skin color, texture, turgor are normal, no rashes or significant lesions EYES: normal, Conjunctiva are pink and non-injected, sclera clear NECK: supple, thyroid normal size, non-tender, without nodularity LYMPH:  no  palpable lymphadenopathy in the cervical, axillary  LUNGS: clear to auscultation and percussion with normal breathing effort HEART: regular rate & rhythm and no murmurs and no lower extremity edema ABDOMEN:abdomen soft, non-tender and normal bowel sounds Musculoskeletal:no cyanosis of digits and no clubbing  NEURO: alert & oriented x 3 with fluent speech, no focal motor/sensory deficits   LABORATORY DATA:  I have reviewed the data as listed    Latest Ref Rng & Units 05/03/2023   10:57 AM 04/19/2023   11:07 AM 04/05/2023   11:54 AM  CBC  WBC 4.0 - 10.5 K/uL 11.0  10.0  3.1   Hemoglobin 13.0 - 17.0 g/dL 7.8  8.0  8.5   Hematocrit 39.0 - 52.0 % 25.6  25.7  26.1   Platelets 150 - 400 K/uL 169  128  195         Latest Ref Rng & Units 05/03/2023   10:57 AM 04/19/2023   11:07 AM 04/05/2023   11:54 AM  CMP  Glucose 70 - 99 mg/dL 604  540  981   BUN 8 - 23 mg/dL 13  12  12    Creatinine 0.61 - 1.24 mg/dL 1.91  4.78  2.95   Sodium 135 - 145 mmol/L 141  142  140   Potassium 3.5 - 5.1 mmol/L 3.9  3.7  3.7   Chloride 98 - 111 mmol/L 111  109  107   CO2 22 - 32 mmol/L 27  29  27    Calcium 8.9 - 10.3 mg/dL 8.1  8.3  8.5   Total Protein 6.5 - 8.1 g/dL 5.4  5.5  5.9   Total Bilirubin 0.0 - 1.2 mg/dL 1.4  1.0  1.0   Alkaline Phos 38 - 126 U/L 101  112  83   AST 15 - 41 U/L 19  19  24    ALT 0 - 44 U/L 27  26  54       RADIOGRAPHIC STUDIES: I have personally reviewed the radiological images as listed and agreed with the findings in the report. No  results found.    Orders Placed This Encounter  Procedures   Ferritin    Standing Status:   Standing    Number of Occurrences:   20    Expiration Date:   05/02/2024   Iron and TIBC    Standing Status:   Future    Expected Date:   05/03/2023    Expiration Date:   05/02/2024   All questions were answered. The patient knows to call the clinic with any problems, questions or concerns. No barriers to learning was detected. The total time spent in the appointment was 30 minutes.     Malachy Mood, MD 05/03/2023

## 2023-05-03 NOTE — Progress Notes (Signed)
Nutrition Follow-up:  Patient with colorectal cancer. He restarted Folfiri secondary to milk progression 1/16.   Met with patient in infusion. He reports doing well overall. Patient endorses good appetite. He eats when hungry. Patient drinks Ensure Complete for breakfast every morning. He had steak, mashed potatoes, salad for dinner. He drinks water through out the day. Patient denies cold sensitivity.   Medications: lipitor, coreg, niferex, klor-con  Labs: glucose 160, albumin 2.8  Anthropometrics: Wt 214 lb 14.4 oz today   1/30 - 211 lb  1/2 - 211 lb 6.4 oz 12/12 - 217 lb 3.2 oz   NUTRITION DIAGNOSIS: Unintended wt loss - stable    INTERVENTION:  Educated on foods with protein, recommend protein source at every meal/snack - snack ideas + soft moist high protein foods list provided  Continue drinking Ensure Complete, recommend 2/day as tolerated for added protein - samples + coupons    MONITORING, EVALUATION, GOAL: wt trends, intake   NEXT VISIT: Thursday March 13 during infusion

## 2023-05-03 NOTE — Addendum Note (Signed)
Addended by: Arcelia Jew on: 05/03/2023 01:56 PM   Modules accepted: Orders

## 2023-05-03 NOTE — Assessment & Plan Note (Addendum)
Unresectable, [facility] -diagnosed 02/2021 by endo-/colonoscopy for weight loss and anemia. Treatment delayed due to his stroke on 04/18/21. S/p 8 cycles neoadjuvant Keytruda 05/11/21 - 09/28/21. He tolerated well with mild fatigue.  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 -He began FOLFOX on 11/24/21. He has tolerated well overall with some taste changes and mild fatigue -restaging CT scan 02/20/2022 showed PR -restaging CT from 05/15/2022 showed improved masslike enlargement of the duodenal as well as fold nodularity and thickening along the stomach, however it showed a new liver lesion, indeterminate, will obtain liver MRI  -Patient complained of worsening fatigue, last cycle chemo was held on 05/18/22. -Liver MRI in March 2024 showed benign liver lesions and a cystic lesion in pancreas, no evidence of liver metastasis.   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  No evidence of distant metastasis. -Due to the cancer progression, and his persistent peripheral neuropathy, I recommend change treatment to second line FOLFIRI, he started on 09/07/22, he is tolerating well so far  -Due to his moderate fatigue, I reduced irinotecan dose slightly from C3 -His performance status has been low due to significant fatigue, required chemo dose reduction and chemo break  --Restaging CT scan from December 04, 2022 showed excellent response, the primary duodenal mass is not really measurable, no other signs of metastasis. -I changed his treatment to maintenance irrinotecan alone on  12/21/2022. He is tolerating better -due to mild disease progression, I changed his chemo back to FOLFIRI on 04/05/2023

## 2023-05-03 NOTE — Progress Notes (Signed)
Verbal order given to administer 1 unit of PRBC for Dr. Mosetta Putt spoke with Tresa Endo in blood bank type and screen order received patient will have 1 unit of PRBC transfused on 05/03/2023

## 2023-05-03 NOTE — Progress Notes (Signed)
Per Dr Mosetta Putt, okay to run Fluorouracil pump over 44 hours vs 46 hours.

## 2023-05-03 NOTE — Patient Instructions (Signed)

## 2023-05-04 LAB — TYPE AND SCREEN
ABO/RH(D): A POS
Antibody Screen: NEGATIVE
Unit division: 0

## 2023-05-04 LAB — BPAM RBC
Blood Product Expiration Date: 202503112359
ISSUE DATE / TIME: 202502131458
Unit Type and Rh: 6200

## 2023-05-05 ENCOUNTER — Inpatient Hospital Stay: Payer: Medicare Other

## 2023-05-05 VITALS — BP 126/46 | HR 87 | Temp 97.6°F | Resp 16

## 2023-05-05 DIAGNOSIS — C170 Malignant neoplasm of duodenum: Secondary | ICD-10-CM

## 2023-05-05 DIAGNOSIS — G620 Drug-induced polyneuropathy: Secondary | ICD-10-CM | POA: Diagnosis not present

## 2023-05-05 DIAGNOSIS — Z7982 Long term (current) use of aspirin: Secondary | ICD-10-CM | POA: Diagnosis not present

## 2023-05-05 DIAGNOSIS — Z5189 Encounter for other specified aftercare: Secondary | ICD-10-CM | POA: Diagnosis not present

## 2023-05-05 DIAGNOSIS — Z5111 Encounter for antineoplastic chemotherapy: Secondary | ICD-10-CM | POA: Diagnosis not present

## 2023-05-05 DIAGNOSIS — Z79899 Other long term (current) drug therapy: Secondary | ICD-10-CM | POA: Diagnosis not present

## 2023-05-05 DIAGNOSIS — D508 Other iron deficiency anemias: Secondary | ICD-10-CM | POA: Diagnosis not present

## 2023-05-05 MED ORDER — PEGFILGRASTIM-CBQV 6 MG/0.6ML ~~LOC~~ SOSY
6.0000 mg | PREFILLED_SYRINGE | Freq: Once | SUBCUTANEOUS | Status: AC
Start: 2023-05-05 — End: 2023-05-05
  Administered 2023-05-05: 6 mg via SUBCUTANEOUS

## 2023-05-05 MED ORDER — HEPARIN SOD (PORK) LOCK FLUSH 100 UNIT/ML IV SOLN
500.0000 [IU] | Freq: Once | INTRAVENOUS | Status: AC | PRN
  Administered 2023-05-05: 500 [IU]

## 2023-05-05 MED ORDER — SODIUM CHLORIDE 0.9% FLUSH
10.0000 mL | INTRAVENOUS | Status: DC | PRN
  Administered 2023-05-05: 10 mL

## 2023-05-15 IMAGING — CT CT CHEST-ABD-PELV W/ CM
2 of 5 series · 14 of 36 positions shown, 16 images · IV contrast (ISOVUE 300)
Comparison: CT chest angiogram, 03/23/2015, CT abdomen pelvis,
05/04/2009

CLINICAL DATA: Evaluate for metastatic disease, new diagnosis
duodenal adenocarcinoma

EXAM:
CT CHEST, ABDOMEN, AND PELVIS WITH CONTRAST
TECHNIQUE: Multidetector CT imaging of the chest, abdomen and pelvis was
performed following the standard protocol during bolus
administration of intravenous contrast.

[Series 2: cap with · axial · 0.98mm/px · z∈[-332,+168]mm · 11 of 122 slices shown, 13 images]
[im 11/122  mediastinal]
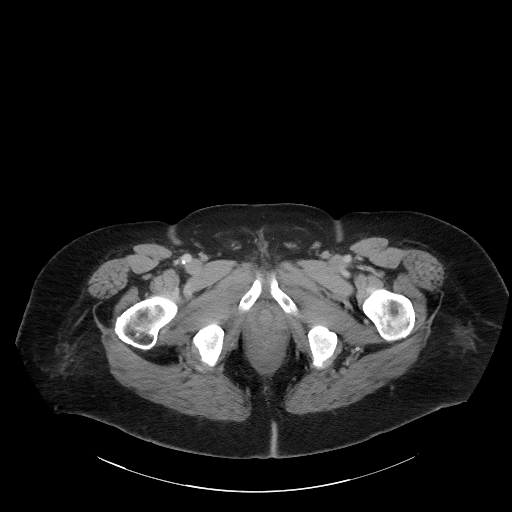
[im 11/122  bone]
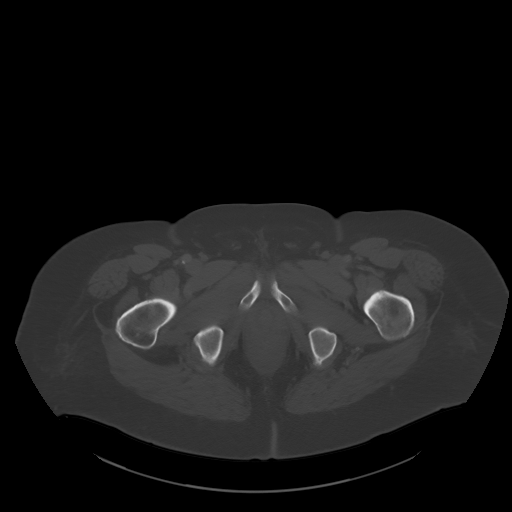
[im 21/122  mediastinal]
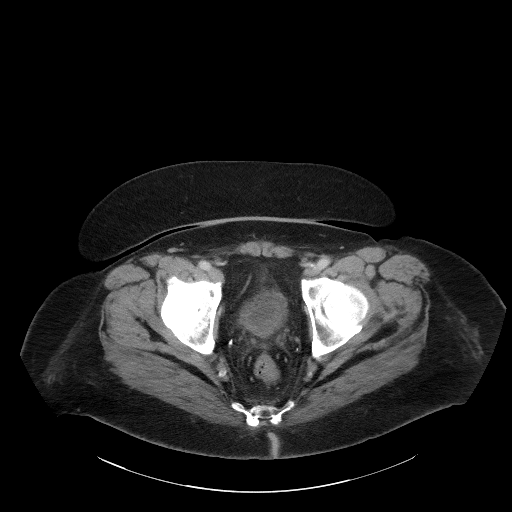
[im 31/122  mediastinal]
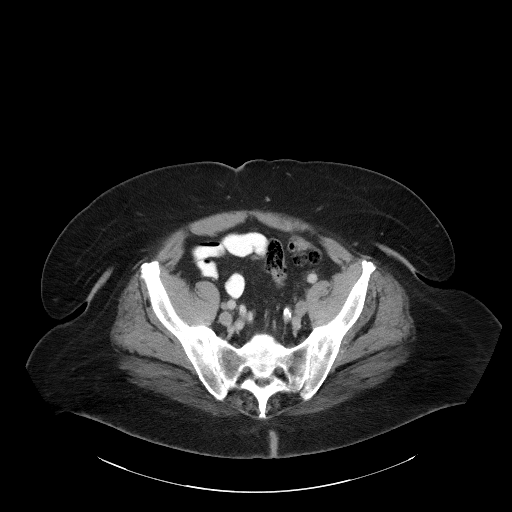
[im 41/122  mediastinal]
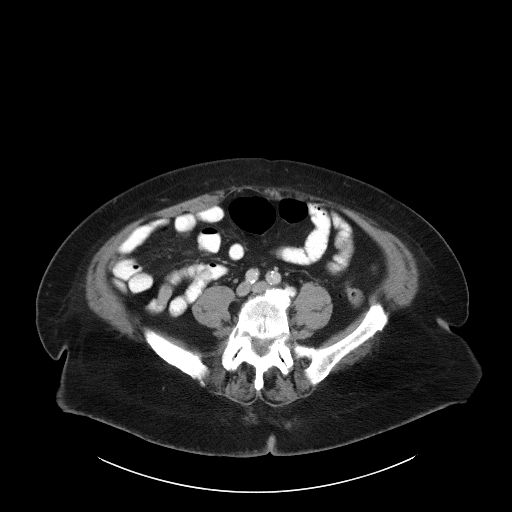
[im 51/122  mediastinal]
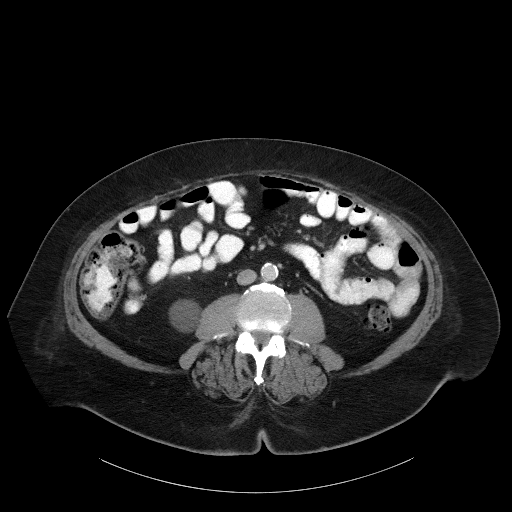
[im 61/122  mediastinal]
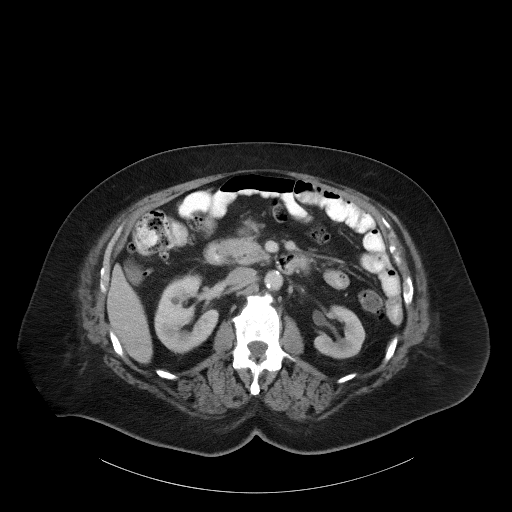
[im 71/122  mediastinal]
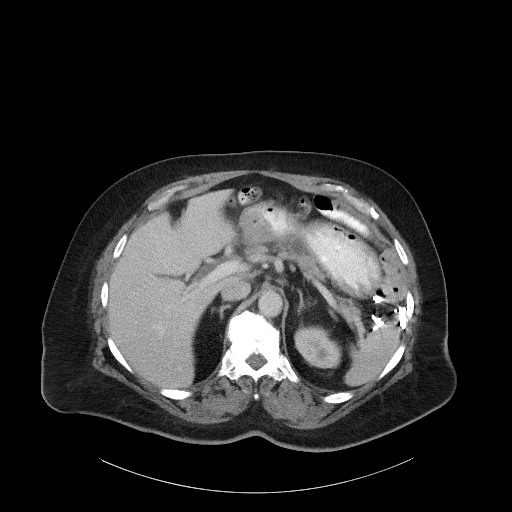
[im 81/122  mediastinal]
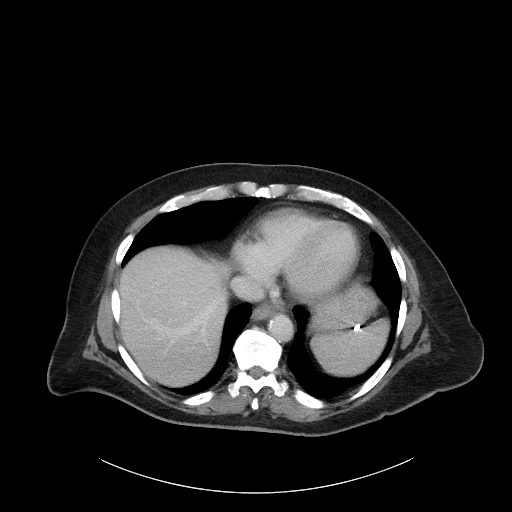
[im 91/122  mediastinal]
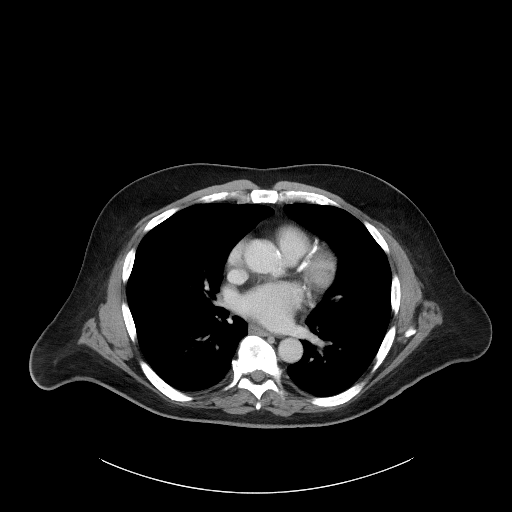
[im 91/122  bone]
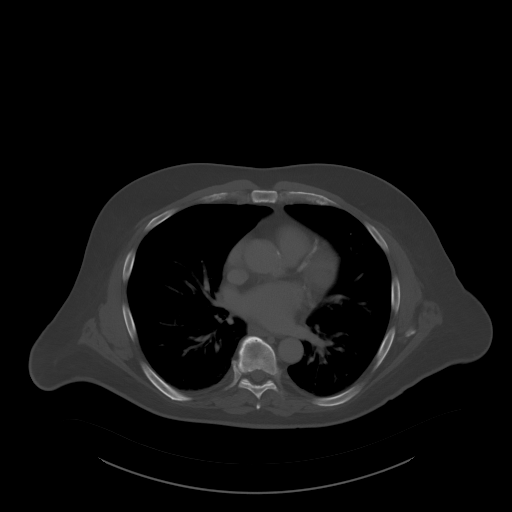
[im 101/122  mediastinal]
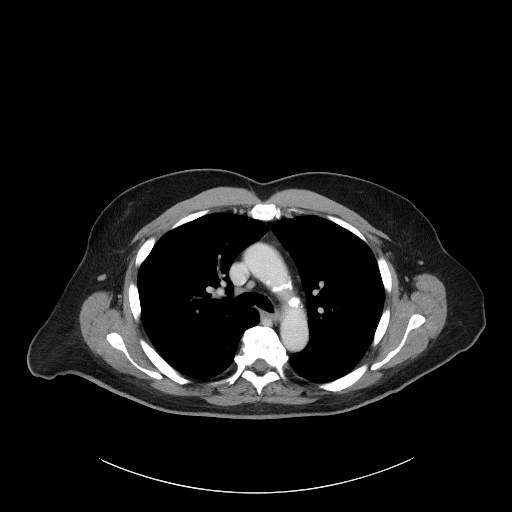
[im 111/122  mediastinal]
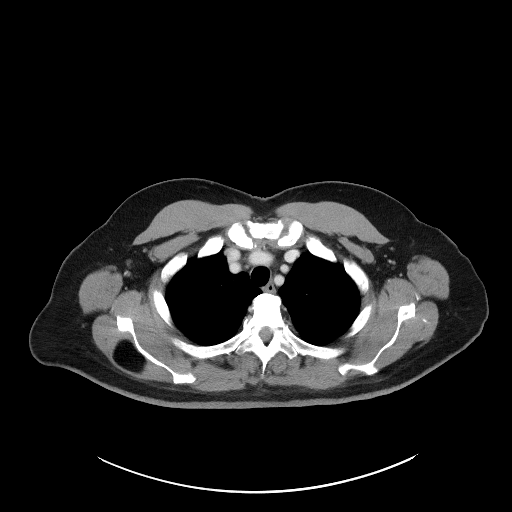

[Series 6: coronals · coronal · 0.92mm/px · 3 of 157 slices shown]
[im 32/157  mediastinal]
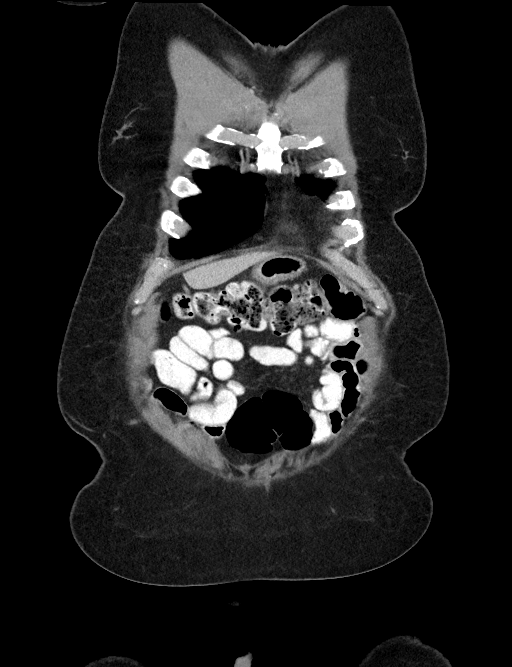
[im 63/157  mediastinal]
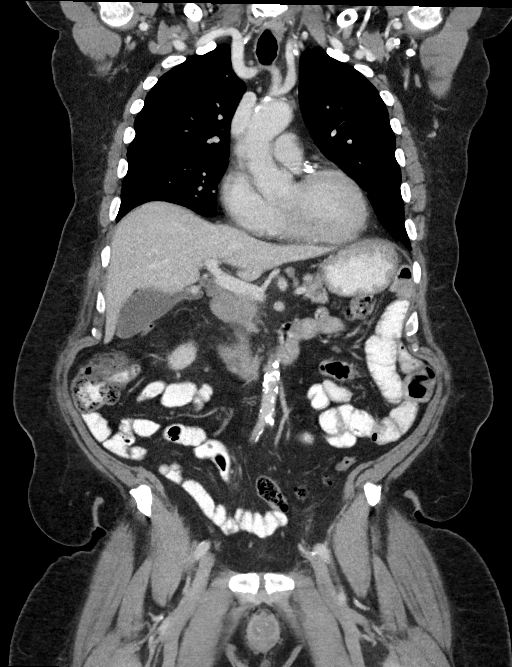
[im 94/157  mediastinal]
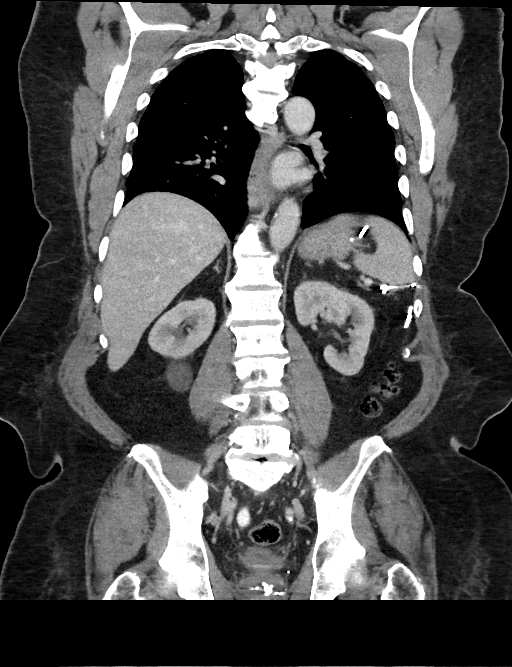

[14 of 36 positions shown; findings below may reference images not displayed]

RADIATION DOSE REDUCTION: This exam was performed according to the
departmental dose-optimization program which includes automated
exposure control, adjustment of the mA and/or kV according to
patient size and/or use of iterative reconstruction technique.

CONTRAST:  100mL L3CA90-BII IOPAMIDOL (L3CA90-BII) INJECTION 61%,
additional oral enteric contrast
FINDINGS: CT CHEST FINDINGS

Cardiovascular: Aortic atherosclerosis. Normal heart size.
Three-vessel coronary artery calcifications and/or stents. No
pericardial effusion.

Mediastinum/Nodes: No enlarged mediastinal, hilar, or axillary lymph
nodes. Thyroid gland, trachea, and esophagus demonstrate no
significant findings.

Lungs/Pleura: Background of very fine centrilobular pulmonary
nodules, most concentrated in the lung apices. No pleural effusion
or pneumothorax.

Musculoskeletal: No chest wall mass or suspicious osseous lesions
identified. Incidental benign lipoma within the right teres major
muscle body (series 2, image 13).

CT ABDOMEN PELVIS FINDINGS

Hepatobiliary: No solid liver abnormality is seen. No gallstones,
gallbladder wall thickening, or biliary dilatation.

Pancreas: Fluid attenuation lesion within the proximal pancreatic
body measuring 1.9 x 1.4 cm, with prominence of the pancreatic duct
distally, up to 0.6 cm (series 2, image 53). This lesion was present
on remote prior examination dated 05/04/2009, and has slightly
increased in size over a long period of time, measuring up to 1.2 x
0.7 cm previously.

Spleen: Normal in size without significant abnormality.

Adrenals/Urinary Tract: Adrenal glands are unremarkable. Kidneys are
normal, without renal calculi, solid lesion, or hydronephrosis.
Thickening of the decompressed urinary bladder, likely related to
chronic outlet obstruction.

Stomach/Bowel: Stomach is within normal limits. Circumferential wall
thickening of the first portion of the duodenum (series 2, image
56). The descending and more distal portions of the duodenum are
normal. Appendix appears normal. No other evidence of bowel wall
thickening, distention, or inflammatory changes. Colonic
diverticulosis.

Vascular/Lymphatic: Aortic atherosclerosis. Enlarged, hypodense
lymph node anterior to the pancreatic head measuring 2.8 x 2.2 cm
(series 2, image 60). Additional enlarged, hypodense portacaval node
measuring up to 4.6 x 2.6 cm (series 2, image 56).

Reproductive: Prostate brachytherapy.

Other: No abdominal wall hernia or abnormality. No ascites. Surgical
clips in the left upper quadrant.

Musculoskeletal: No acute osseous findings.
IMPRESSION: 1. Circumferential wall thickening of the first portion of the
duodenum, in keeping with known primary malignancy.
2. Enlarged, hypodense lymph nodes anterior to the pancreatic head
and in the portacaval station, concerning for nodal metastatic
disease.
3. No other evidence of metastatic disease in the chest, abdomen, or
pelvis.
4. Incidental note of a fluid attenuation lesion within the proximal
pancreatic body measuring 1.9 x 1.4 cm, with prominence of the
pancreatic duct distally, up to 0.6 cm. This lesion was present on
remote prior examination dated 05/04/2009, and has slightly
increased in size over a long period of time. This is consistent
with a small IPMN and benign given indolent behavior over greater
than 10 years.
5. Background of very fine centrilobular pulmonary nodules, most
concentrated in the lung apices, most commonly seen in
smoking-related respiratory bronchiolitis.
6. Coronary artery disease.

Aortic Atherosclerosis ([id number]).

## 2023-05-16 ENCOUNTER — Other Ambulatory Visit: Payer: Self-pay | Admitting: Hematology

## 2023-05-17 ENCOUNTER — Inpatient Hospital Stay: Payer: Medicare Other

## 2023-05-17 ENCOUNTER — Encounter: Payer: Self-pay | Admitting: Hematology

## 2023-05-17 ENCOUNTER — Inpatient Hospital Stay: Payer: Medicare Other | Admitting: Hematology

## 2023-05-17 ENCOUNTER — Other Ambulatory Visit: Payer: Self-pay

## 2023-05-17 VITALS — BP 87/48 | HR 85 | Temp 98.0°F | Resp 15 | Wt 204.6 lb

## 2023-05-17 VITALS — BP 118/50 | Resp 16

## 2023-05-17 DIAGNOSIS — C170 Malignant neoplasm of duodenum: Secondary | ICD-10-CM

## 2023-05-17 DIAGNOSIS — D649 Anemia, unspecified: Secondary | ICD-10-CM | POA: Diagnosis not present

## 2023-05-17 DIAGNOSIS — Z95828 Presence of other vascular implants and grafts: Secondary | ICD-10-CM

## 2023-05-17 DIAGNOSIS — G620 Drug-induced polyneuropathy: Secondary | ICD-10-CM | POA: Diagnosis not present

## 2023-05-17 DIAGNOSIS — D508 Other iron deficiency anemias: Secondary | ICD-10-CM | POA: Diagnosis not present

## 2023-05-17 DIAGNOSIS — D500 Iron deficiency anemia secondary to blood loss (chronic): Secondary | ICD-10-CM

## 2023-05-17 DIAGNOSIS — Z5189 Encounter for other specified aftercare: Secondary | ICD-10-CM | POA: Diagnosis not present

## 2023-05-17 DIAGNOSIS — Z79899 Other long term (current) drug therapy: Secondary | ICD-10-CM | POA: Diagnosis not present

## 2023-05-17 DIAGNOSIS — Z7982 Long term (current) use of aspirin: Secondary | ICD-10-CM | POA: Diagnosis not present

## 2023-05-17 DIAGNOSIS — Z5111 Encounter for antineoplastic chemotherapy: Secondary | ICD-10-CM | POA: Diagnosis not present

## 2023-05-17 LAB — CBC WITH DIFFERENTIAL (CANCER CENTER ONLY)
Abs Immature Granulocytes: 0.19 10*3/uL — ABNORMAL HIGH (ref 0.00–0.07)
Basophils Absolute: 0 10*3/uL (ref 0.0–0.1)
Basophils Relative: 0 %
Eosinophils Absolute: 0 10*3/uL (ref 0.0–0.5)
Eosinophils Relative: 0 %
HCT: 26.6 % — ABNORMAL LOW (ref 39.0–52.0)
Hemoglobin: 8.1 g/dL — ABNORMAL LOW (ref 13.0–17.0)
Immature Granulocytes: 1 %
Lymphocytes Relative: 4 %
Lymphs Abs: 0.5 10*3/uL — ABNORMAL LOW (ref 0.7–4.0)
MCH: 25.6 pg — ABNORMAL LOW (ref 26.0–34.0)
MCHC: 30.5 g/dL (ref 30.0–36.0)
MCV: 83.9 fL (ref 80.0–100.0)
Monocytes Absolute: 0.8 10*3/uL (ref 0.1–1.0)
Monocytes Relative: 6 %
Neutro Abs: 12.8 10*3/uL — ABNORMAL HIGH (ref 1.7–7.7)
Neutrophils Relative %: 89 %
Platelet Count: 177 10*3/uL (ref 150–400)
RBC: 3.17 MIL/uL — ABNORMAL LOW (ref 4.22–5.81)
RDW: 22.6 % — ABNORMAL HIGH (ref 11.5–15.5)
WBC Count: 14.4 10*3/uL — ABNORMAL HIGH (ref 4.0–10.5)
nRBC: 0.3 % — ABNORMAL HIGH (ref 0.0–0.2)

## 2023-05-17 LAB — CMP (CANCER CENTER ONLY)
ALT: 78 U/L — ABNORMAL HIGH (ref 0–44)
AST: 44 U/L — ABNORMAL HIGH (ref 15–41)
Albumin: 2.6 g/dL — ABNORMAL LOW (ref 3.5–5.0)
Alkaline Phosphatase: 101 U/L (ref 38–126)
Anion gap: 3 — ABNORMAL LOW (ref 5–15)
BUN: 16 mg/dL (ref 8–23)
CO2: 26 mmol/L (ref 22–32)
Calcium: 8.1 mg/dL — ABNORMAL LOW (ref 8.9–10.3)
Chloride: 110 mmol/L (ref 98–111)
Creatinine: 1.04 mg/dL (ref 0.61–1.24)
GFR, Estimated: >60 mL/min (ref >60)
Glucose, Bld: 124 mg/dL — ABNORMAL HIGH (ref 70–99)
Potassium: 4.6 mmol/L (ref 3.5–5.1)
Sodium: 139 mmol/L (ref 135–145)
Total Bilirubin: 1.5 mg/dL — ABNORMAL HIGH (ref 0.0–1.2)
Total Protein: 5.4 g/dL — ABNORMAL LOW (ref 6.5–8.1)

## 2023-05-17 LAB — FERRITIN: Ferritin: 686 ng/mL — ABNORMAL HIGH (ref 24–336)

## 2023-05-17 LAB — VITAMIN B12: Vitamin B-12: >7500 pg/mL — ABNORMAL HIGH (ref 180–914)

## 2023-05-17 MED ORDER — HEPARIN SOD (PORK) LOCK FLUSH 100 UNIT/ML IV SOLN
500.0000 [IU] | Freq: Once | INTRAVENOUS | Status: AC
  Administered 2023-05-17: 500 [IU]

## 2023-05-17 MED ORDER — DULOXETINE HCL 30 MG PO CPEP: 30.0000 mg | ORAL_CAPSULE | Freq: Every day | ORAL | 1 refills | Status: DC

## 2023-05-17 MED ORDER — SODIUM CHLORIDE 0.9% FLUSH
10.0000 mL | Freq: Once | INTRAVENOUS | Status: AC
  Administered 2023-05-17: 10 mL

## 2023-05-17 MED ORDER — SODIUM CHLORIDE 0.9 % IV SOLN: INTRAVENOUS | Status: DC

## 2023-05-17 MED ORDER — IRON SUCROSE 20 MG/ML IV SOLN
200.0000 mg | Freq: Once | INTRAVENOUS | Status: AC
Start: 2023-05-17 — End: 2023-05-17
  Administered 2023-05-17: 200 mg via INTRAVENOUS
  Filled 2023-05-17: qty 10

## 2023-05-17 NOTE — Patient Instructions (Signed)

## 2023-05-17 NOTE — Assessment & Plan Note (Signed)
 Unresectable, [facility] -diagnosed 02/2021 by endo-/colonoscopy for weight loss and anemia. Treatment delayed due to his stroke on 04/18/21. S/p 8 cycles neoadjuvant Keytruda 05/11/21 - 09/28/21. He tolerated well with mild fatigue.  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 -He began FOLFOX on 11/24/21. He has tolerated well overall with some taste changes and mild fatigue -restaging CT scan 02/20/2022 showed PR -restaging CT from 05/15/2022 showed improved masslike enlargement of the duodenal as well as fold nodularity and thickening along the stomach, however it showed a new liver lesion, indeterminate, will obtain liver MRI  -Patient complained of worsening fatigue, last cycle chemo was held on 05/18/22. -Liver MRI in March 2024 showed benign liver lesions and a cystic lesion in pancreas, no evidence of liver metastasis.   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  No evidence of distant metastasis. -Due to the cancer progression, and his persistent peripheral neuropathy, I recommend change treatment to second line FOLFIRI, he started on 09/07/22, he is tolerating well so far  -Due to his moderate fatigue, I reduced irinotecan dose slightly from C3 -His performance status has been low due to significant fatigue, required chemo dose reduction and chemo break  --Restaging CT scan from December 04, 2022 showed excellent response, the primary duodenal mass is not really measurable, no other signs of metastasis. -I changed his treatment to maintenance irrinotecan alone on  12/21/2022. He is tolerating better -due to mild disease progression, I changed his chemo back to FOLFIRI on 04/05/2023

## 2023-05-17 NOTE — Progress Notes (Signed)
 [facility]   [phone] [phone]   Clinic Follow up Note   Patient Care Team: Corwin Levins, MD as PCP - General Jens Som, Madolyn Frieze, MD as PCP - Cardiology (Cardiology) Iva Boop, MD as Consulting Physician (Gastroenterology) Gean Birchwood, MD as Consulting Physician (Orthopedic Surgery) Sallye Lat, MD as Consulting Physician (Ophthalmology) Malachy Mood, MD as Consulting Physician (Oncology)  Date of Service:  05/17/2023  CHIEF COMPLAINT: f/u of duodenal cancer  CURRENT THERAPY:  FOLFIRI  Oncology History   Adenocarcinoma of duodenum (HCC) Unresectable, [facility] -diagnosed 02/2021 by endo-/colonoscopy for weight loss and anemia. Treatment delayed due to his stroke on 04/18/21. S/p 8 cycles neoadjuvant Keytruda 05/11/21 - 09/28/21. He tolerated well with mild fatigue.  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 -He began FOLFOX on 11/24/21. He has tolerated well overall with some taste changes and mild fatigue -restaging CT scan 02/20/2022 showed PR -restaging CT from 05/15/2022 showed improved masslike enlargement of the duodenal as well as fold nodularity and thickening along the stomach, however it showed a new liver lesion, indeterminate, will obtain liver MRI  -Patient complained of worsening fatigue, last cycle chemo was held on 05/18/22. -Liver MRI in March 2024 showed benign liver lesions and a cystic lesion in pancreas, no evidence of liver metastasis.   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  No evidence of distant metastasis. -Due to the cancer progression, and his persistent peripheral neuropathy, I recommend  change treatment to second line FOLFIRI, he started on 09/07/22, he is tolerating well so far  -Due to his moderate fatigue, I reduced irinotecan dose slightly from C3 -His performance status has been low due to significant fatigue, required chemo dose reduction and chemo break  --Restaging CT scan from December 04, 2022 showed excellent response, the primary duodenal mass is not really measurable, no other signs of metastasis. -I changed his treatment to maintenance irrinotecan alone on  12/21/2022. He is tolerating better -due to mild disease progression, I changed his chemo back to FOLFIRI on 04/05/2023   Assessment and Plan    Duodenal cancer Recent scan showed progression and chemo was switched to for FOLFIRI. Current chemotherapy poorly tolerated with significant fatigue, hypotension, and diarrhea. Previous Keytruda ineffective. Considering new combination immunotherapy (nivolumab and ipilimumab) with potential for better tolerance and efficacy. Discussed risks including pneumonitis, joint pain, diarrhea, and severe side effects (10-15% risk) leading to hospitalization or ICU admission. Patient prefers new immunotherapy due to poor tolerance of current chemo regimen. -Cancel chemotherapy today and administer IV fluids today - Hold chemotherapy for 2-4 weeks for recovery - [employer] approval for new immunotherapy regimen - Call in two weeks to assess recovery and schedule new treatment  Peripheral Neuropathy Worsening neuropathy in feet affecting balance and mobility. Uses cane occasionally but resistant to walker. - Refill gabapentin prescription  Hypertension Hypotension today. On carvedilol, amlodipine, benazepril. Plan to hold medications temporarily to stabilize blood pressure. - Hold amlodipine first, then benazepril, lastly carvedilol if hypotension persists - Monitor blood pressure at home and restart medications as it stabilizes  General Health Maintenance Emphasized  importance of hydration and nutrition for overall health and recovery. - Encourage increased fluid and food intake - Monitor  appetite and energy levels  Plan -Due to profound fatigue, orthostatic hypotension, will hold chemotherapy today and give normal saline 1 L over 2 hours - Cancel next chemotherapy appointment - Schedule phone visit in two weeks to assess recovery and plan new treatment -Plan to switch therapy to nivolumab and ipilimumab when he recovers    SUMMARY OF ONCOLOGIC HISTORY: Oncology History Overview Note   Cancer Staging  Adenocarcinoma of duodenum (HCC) Staging form: Small Intestine - Adenocarcinoma, AJCC 8th Edition - Clinical stage from 03/18/2021: Stage IIIA (cTX, cN1, cM0) - Signed by Malachy Mood, MD on 06/22/2021 Stage prefix: Initial diagnosis     Adenocarcinoma of duodenum (HCC)  03/01/2021 Imaging   EXAM: ABDOMEN ULTRASOUND COMPLETE  IMPRESSION: 1. Simple appearing right renal cyst. 2. Otherwise negative abdominal ultrasound   03/18/2021 Procedure   Upper Endoscopy/Colonoscopy, Dr. Myrtie Neither  Upper Endoscopy Impression: - Normal esophagus. - A single submucosal papule (nodule) found in the stomach. - Duodenal mass. Biopsied. Concerning for malignancy.  Colonoscopy Impression: - Diverticulosis in the entire examined colon. - The examined portion of the ileum was normal. - Patent end-to-end colo-colonic anastomosis, characterized by healthy appearing mucosa. - Internal hemorrhoids. - No specimens collected.   03/18/2021 Initial Biopsy   Diagnosis Duodenum, Biopsy - INVASIVE MODERATE TO POORLY DIFFERENTIATED ADENOCARCINOMA   03/18/2021 Cancer Staging   Staging form: Small Intestine - Adenocarcinoma, AJCC 8th Edition - Clinical stage from 03/18/2021: Stage IIIA (cTX, cN1, cM0) - Signed by Malachy Mood, MD on 06/22/2021 Stage prefix: Initial diagnosis   03/22/2021 Initial Diagnosis   Adenocarcinoma of duodenum (HCC)   04/01/2021 Imaging   EXAM: CT  CHEST, ABDOMEN, AND PELVIS WITH CONTRAST  IMPRESSION: 1. Circumferential wall thickening of the first portion of the duodenum, in keeping with known primary malignancy. 2. Enlarged, hypodense lymph nodes anterior to the pancreatic head and in the portacaval station, concerning for nodal metastatic disease. 3. No other evidence of metastatic disease in the chest, abdomen, or pelvis. 4. Incidental note of a fluid attenuation lesion within the proximal pancreatic body measuring 1.9 x 1.4 cm, with prominence of the pancreatic duct distally, up to 0.6 cm. This lesion was present on remote prior examination dated 05/04/2009, and has slightly increased in size over a long period of time. This is consistent with a small IPMN and benign given indolent behavior over greater than 10 years. 5. Background of very fine centrilobular pulmonary nodules, most concentrated in the lung apices, most commonly seen in smoking-related respiratory bronchiolitis. 6. Coronary artery disease.   Aortic Atherosclerosis (ICD10-I70.0).    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 Of note, a variant of uncertain significance was detected in the ATM (p.A869G), BLM (c.800-3T>G), and SDHA (p.V632F) genes. Report date is 04/27/2021.  The CancerNext-Expanded gene panel offered by [facility] and includes sequencing, rearrangement, and RNA analysis for the following 77 genes: AIP, ALK, APC, ATM, AXIN2, BAP1, BARD1, BLM, BMPR1A, BRCA1, BRCA2, BRIP1, CDC73, CDH1, CDK4, CDKN1B, CDKN2A, CHEK2, CTNNA1, DICER1, FANCC, FH, FLCN, GALNT12, KIF1B, LZTR1, MAX, MEN1, MET, MLH1, MSH2, MSH3, MSH6, MUTYH, NBN, NF1, NF2, NTHL1, PALB2, PHOX2B, PMS2, POT1, PRKAR1A, PTCH1, PTEN, RAD51C, RAD51D, RB1, RECQL, RET, SDHA, SDHAF2, SDHB, SDHC, SDHD, SMAD4, SMARCA4, SMARCB1, SMARCE1, STK11, SUFU, TMEM127, TP53, TSC1, TSC2, VHL and XRCC2 (sequencing  and deletion/duplication); EGFR, EGLN1, HOXB13, KIT, MITF, PDGFRA, POLD1, and POLE (sequencing only); EPCAM and GREM1 (deletion/duplication only).    05/11/2021 -  09/28/2021 Chemotherapy   Patient is on Treatment Plan : COLORECTAL Pembrolizumab (200) q21d     11/24/2021 - 05/03/2022 Chemotherapy   Patient is on Treatment Plan : COLORECTAL FOLFOX q14d x 4 months     02/20/2022 Imaging    IMPRESSION: 1. Distal stomach/duodenal mass is decreased in size when compared with prior exam. 2. Stable upper abdominal adenopathy. 3. 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 4. Aortic Atherosclerosis (ICD10-I70.0).   05/15/2022 Imaging    IMPRESSION: Improved masslike enlargement of the duodenal as well as fold nodularity and thickening along the stomach. In addition there are some small nodes identified in the upper abdomen which are similar overall compared to the study of December 2023   However there is a new spiculated nodule in the middle lobe which is worrisome. In addition there are some small low-attenuation liver lesions which are too small to completely characterize but were not clearly seen on the previous examination. Recommend further evaluation. For the liver lesions in particular a Eovist liver MRI may be of some benefit as clinically directed.   Persistent cystic lesion along the midbody of the pancreas with associated duct dilatation. A cystic neoplasm of the pancreas is possible.   06/06/2022 Imaging   MR Abdomen W Wo Contrast     IMPRESSION: 1. No definite evidence of hepatic metastatic disease. Scattered punctate foci of T2 hyperintensity throughout the liver are not associated with any suspicious enhancement or restricted diffusion and are favored to reflect small cysts or biliary hamartomas. Some of these are suboptimally characterized based on their  small size, and given nonvisualization on older prior studies, attention on follow-up CT in 3-6 months recommended. 2. Unchanged residual irregular thickening of the walls of the distal stomach and proximal duodenum. 3. 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 This lesion is unchanged in size from recent CT, although has slowly enlarged from older prior examinations. Recommend follow-up abdominal MRI in 1 year. 4. Stable mild extrahepatic biliary dilatation status post cholecystectomy. 5. Aortic and branch vessel atherosclerosis, better seen on CT.   09/07/2022 - 05/05/2023 Chemotherapy   Patient is on Treatment Plan : COLORECTAL FOLFIRI q14d     04/04/2023 Imaging   CT chest abdomen and pelvis with contrast IMPRESSION: Significant increase in size of soft tissue mass involving the distal stomach and proximal duodenum with the extension of the abnormal soft tissue extending into the porta hepatis. Additional adjacent small lymph nodes in the adjacent mesentery are suspicious. Stable prominent right hilar lymph node and small mediastinal nodes. These similar to previous.   Increasing size cystic pancreatic lesion centrally with the ductal dilatation. Please correlate for a cystic pancreatic neoplasm.        Discussed the use of AI scribe software for clinical note transcription with the patient, who gave verbal consent to proceed.  History of Present Illness   The patient, with a history of anal cancer, presents with ongoing fatigue and weakness. He reports neuropathy, primarily in the feet, which has been worsening. The patient's balance has been off, necessitating the use of a wheelchair for mobility. He experienced a bout of diarrhea, which has since resolved with medication. The patient's appetite has decreased, and he has been experiencing a cough. The patient's symptoms have been ongoing since his last cycle  of chemotherapy.  All other systems were reviewed with the patient and are negative.  MEDICAL HISTORY:  Past Medical History:  Diagnosis Date   Anemia 10/31/2021   Dx IDA   Carotid stenosis, left    Colon cancer (HCC) 2000   Coronary artery disease    per Dr. 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, volume 11.5 ml   Stroke (HCC) 04/18/2021   TIA (transient ischemic attack)     SURGICAL HISTORY: Past Surgical History:  Procedure Laterality Date   CHOLECYSTECTOMY N/A 10/19/2021   Procedure: CHOLECYSTECTOMY;  Surgeon: Almond Lint, MD;  Location: [facility] OR;  Service: General;  Laterality: N/A;   COLON SURGERY  2000   colon cancer   COLONOSCOPY     ENDARTERECTOMY Left 04/25/2021   Procedure: LEFT ENDARTERECTOMY CAROTID;  Surgeon: Cephus Shelling, MD;  Location: [facility] OR;  Service: Vascular;  Laterality: Left;   EUS     pancreatic cyst   KNEE ARTHROSCOPY  2007   LT- [facility]   LAPAROSCOPY N/A 10/19/2021   Procedure: LAPAROSCOPY DIAGNOSTIC;  Surgeon: Almond Lint, MD;  Location: [facility] OR;  Service: General;  Laterality: N/A;  GENERAL AND TAP BLOCK   PATCH ANGIOPLASTY Left 04/25/2021   Procedure: PATCH ANGIOPLASTY XENOSURE 1CM X 6CM;  Surgeon: Cephus Shelling, MD;  Location: [facility] OR;  Service: Vascular;  Laterality: Left;   PORTACATH PLACEMENT Left 11/17/2021   Procedure: PORT PLACEMENT;  Surgeon: Almond Lint, MD;  Location: [facility] OR;  Service: General;  Laterality: Left;   PROSTATE BIOPSY  05/23/13   gleason 7, volume 11.5 ml   WHIPPLE PROCEDURE N/A 10/19/2021   Procedure: ATTEMPTED WHIPPLE PROCEDURE  WITH  GASTRODUODENAL BYPASS;  Surgeon: Almond Lint, MD;  Location: [facility] OR;  Service: General;  Laterality: N/A;  GENERAL AND TAP BLOCK    I have reviewed the social history and family history with the patient and they are unchanged from previous note.  ALLERGIES:  is allergic to lyrica [pregabalin] and ciprofloxacin.  MEDICATIONS:  Current Outpatient Medications  Medication Sig Dispense Refill   amLODipine (NORVASC) 10 MG tablet TAKE 1 TABLET BY MOUTH ONCE DAILY 90 tablet 3   aspirin EC 81 MG EC tablet Take 1 tablet (81 mg total) by mouth daily. Swallow whole. 30 tablet 11   atorvastatin (LIPITOR) 40 MG tablet Take 1 tablet by mouth once daily 90 tablet 0   b complex vitamins capsule Take 1 capsule by mouth daily.     benazepril (LOTENSIN) 40 MG tablet Take 1 tablet by mouth once daily 90 tablet 2   carvedilol (COREG) 12.5 MG tablet TAKE 1 TABLET BY MOUTH TWICE A DAY 60 tablet 0   cyanocobalamin (VITAMIN B12) 500 MCG tablet Take 500 mcg by mouth daily.     dexamethasone (DECADRON) 4 MG tablet Take 1 tablet (4 mg total) by mouth daily. Starting the day after chemo, take for 3 to 5 days 20 tablet 0   diphenoxylate-atropine (LOMOTIL) 2.5-0.025 MG tablet Take 1-2 tablets by mouth 4 (four) times daily as needed for diarrhea or loose stools.  20 tablet 0   DULoxetine (CYMBALTA) 30 MG capsule Take 1 capsule (30 mg total) by mouth daily. 55 capsule 1   iron polysaccharides (NIFEREX) 150 MG capsule Take 1 capsule (150 mg total) by mouth 2 (two) times daily. 60 capsule 2   potassium chloride (KLOR-CON 10) 10 MEQ tablet 2 tab by mouth three times per day 180 tablet 11   potassium chloride SA (KLOR-CON M20) 20 MEQ tablet Take 1 tablet (20 mEq total) by mouth 3 (three) times daily. 90 tablet 2   prochlorperazine (COMPAZINE) 10 MG tablet Take 1 tablet (10 mg total) by mouth every 6 (six) hours as needed for nausea or vomiting (Use for nausea and / or vomiting unresolved with ondansetron (Zofran).). 30 tablet 0    traMADol (ULTRAM) 50 MG tablet TAKE 1 TABLET BY MOUTH EVERY 12 HOURS AS NEEDED 60 tablet 2   Travoprost, BAK Free, (TRAVATAN) 0.004 % SOLN ophthalmic solution Place 1 drop into both eyes at bedtime.     VITAMIN D PO Take 2 tablets by mouth daily. Gummies     No current facility-administered medications for this visit.   Facility-Administered Medications Ordered in Other Visits  Medication Dose Route Frequency Provider Last Rate Last Admin   0.9 %  sodium chloride infusion   Intravenous Continuous Malachy Mood, MD   Stopped at 05/17/23 1419    PHYSICAL EXAMINATION: ECOG PERFORMANCE STATUS: 3 - Symptomatic, >50% confined to bed  Vitals:   05/17/23 1136 05/17/23 1138  BP: (!) 89/48 (!) 87/48  Pulse:    Resp:    Temp:    SpO2:     Wt Readings from Last 3 Encounters:  05/17/23 204 lb 9.6 oz (92.8 kg)  05/03/23 214 lb 14.4 oz (97.5 kg)  05/01/23 216 lb (98 kg)     GENERAL:alert, no distress and chronic ill-appearing SKIN: skin color, texture, turgor are normal, no rashes or significant lesions EYES: normal, Conjunctiva are pink and non-injected, sclera clear NECK: supple, thyroid normal size, non-tender, without nodularity LYMPH:  no palpable lymphadenopathy in the cervical, axillary  LUNGS: clear to auscultation and percussion with normal breathing effort HEART: regular rate & rhythm and no murmurs and no lower extremity edema ABDOMEN:abdomen soft, non-tender and normal bowel sounds Musculoskeletal:no cyanosis of digits and no clubbing  NEURO: alert & oriented x 3 with fluent speech, no focal motor/sensory deficits       LABORATORY DATA:  I have reviewed the data as listed    Latest Ref Rng & Units 05/17/2023   11:08 AM 05/03/2023   10:57 AM 04/19/2023   11:07 AM  CBC  WBC 4.0 - 10.5 K/uL 14.4  11.0  10.0   Hemoglobin 13.0 - 17.0 g/dL 8.1  7.8  8.0   Hematocrit 39.0 - 52.0 % 26.6  25.6  25.7   Platelets 150 - 400 K/uL 177  169  128         Latest Ref Rng & Units  05/17/2023   11:08 AM 05/03/2023   10:57 AM 04/19/2023   11:07 AM  CMP  Glucose 70 - 99 mg/dL 161  096  045   BUN 8 - 23 mg/dL 16  13  12    Creatinine 0.61 - 1.24 mg/dL 4.09  8.11  9.14   Sodium 135 - 145 mmol/L 139  141  142   Potassium 3.5 - 5.1 mmol/L 4.6  3.9  3.7   Chloride 98 - 111 mmol/L 110  111  109  CO2 22 - 32 mmol/L 26  27  29    Calcium 8.9 - 10.3 mg/dL 8.1  8.1  8.3   Total Protein 6.5 - 8.1 g/dL 5.4  5.4  5.5   Total Bilirubin 0.0 - 1.2 mg/dL 1.5  1.4  1.0   Alkaline Phos 38 - 126 U/L 101  101  112   AST 15 - 41 U/L 44  19  19   ALT 0 - 44 U/L 78  27  26       RADIOGRAPHIC STUDIES: I have personally reviewed the radiological images as listed and agreed with the findings in the report. No results found.    Orders Placed This Encounter  Procedures   Ferritin    Standing Status:   Future    Number of Occurrences:   1    Expected Date:   05/17/2023    Expiration Date:   05/16/2024   Vitamin B12    Standing Status:   Future    Number of Occurrences:   1    Expected Date:   05/17/2023    Expiration Date:   05/16/2024   All questions were answered. The patient knows to call the clinic with any problems, questions or concerns. No barriers to learning was detected. The total time spent in the appointment was 40 minutes.     Malachy Mood, MD 05/17/2023

## 2023-05-19 ENCOUNTER — Inpatient Hospital Stay: Payer: Medicare Other

## 2023-05-23 ENCOUNTER — Encounter: Payer: Self-pay | Admitting: Hematology

## 2023-05-23 ENCOUNTER — Other Ambulatory Visit: Payer: Self-pay | Admitting: Internal Medicine

## 2023-05-23 ENCOUNTER — Encounter: Payer: Self-pay | Admitting: Internal Medicine

## 2023-05-23 DIAGNOSIS — R1310 Dysphagia, unspecified: Secondary | ICD-10-CM

## 2023-05-23 DIAGNOSIS — C170 Malignant neoplasm of duodenum: Secondary | ICD-10-CM

## 2023-05-24 ENCOUNTER — Other Ambulatory Visit: Payer: Self-pay | Admitting: Internal Medicine

## 2023-05-24 DIAGNOSIS — C170 Malignant neoplasm of duodenum: Secondary | ICD-10-CM

## 2023-05-24 DIAGNOSIS — G8929 Other chronic pain: Secondary | ICD-10-CM

## 2023-05-25 ENCOUNTER — Telehealth: Payer: Self-pay

## 2023-05-25 ENCOUNTER — Telehealth: Payer: Self-pay | Admitting: *Deleted

## 2023-05-25 DIAGNOSIS — C170 Malignant neoplasm of duodenum: Secondary | ICD-10-CM

## 2023-05-25 DIAGNOSIS — R2689 Other abnormalities of gait and mobility: Secondary | ICD-10-CM

## 2023-05-25 NOTE — Telephone Encounter (Signed)
 Duplicate order.

## 2023-05-25 NOTE — Telephone Encounter (Signed)
 Tried contacting pt and spouse 3 times but was unable to reach them.  LVM on pt's phone requesting a return call regarding MyChart message sent to Dr. Latanya Maudlin office.  Also, reached out to pt's daughter Judithann Sauger which this nurse spoke with regarding trying to contact her father.  Judithann Sauger stated she was just on the telephone with pt and will contact him and tell the pt to contact Dr. Latanya Maudlin office.  Awaiting call from pt or pt's spouse.

## 2023-05-25 NOTE — Progress Notes (Signed)
 Care Guide Pharmacy Note  05/25/2023 Name: Aaron Moss MRN: [id number] DOB: 1945/06/04  Referred By: Corwin Levins, MD Reason for referral: Care Coordination (Outreach to schedule referral with pharmacist)   Aaron Moss is a 78 y.o. year old male who is a primary care patient of Corwin Levins, MD.  Aaron Moss was referred to the pharmacist for assistance related to:  medication adherence   Successful contact was made with the patient to discuss pharmacy services including being ready for the pharmacist to call at least 5 minutes before the scheduled appointment time and to have medication bottles and any blood pressure readings ready for review. The patient agreed to meet with the pharmacist via telephone visit on 05/29/2023  Burman Nieves, CMA, Care Guide [facility], [facility] Guide Direct Dial: [phone]  Fax: [phone] Website: [email or web address]

## 2023-05-29 ENCOUNTER — Inpatient Hospital Stay

## 2023-05-29 ENCOUNTER — Other Ambulatory Visit: Payer: Self-pay | Admitting: Nurse Practitioner

## 2023-05-29 ENCOUNTER — Telehealth: Payer: Self-pay | Admitting: Pharmacist

## 2023-05-29 ENCOUNTER — Telehealth: Payer: Self-pay | Admitting: Nurse Practitioner

## 2023-05-29 ENCOUNTER — Inpatient Hospital Stay (HOSPITAL_BASED_OUTPATIENT_CLINIC_OR_DEPARTMENT_OTHER): Admitting: Nurse Practitioner

## 2023-05-29 ENCOUNTER — Other Ambulatory Visit

## 2023-05-29 VITALS — BP 126/60 | HR 85 | Temp 98.0°F | Resp 17 | Wt 200.5 lb

## 2023-05-29 DIAGNOSIS — Z923 Personal history of irradiation: Secondary | ICD-10-CM | POA: Diagnosis not present

## 2023-05-29 DIAGNOSIS — E46 Unspecified protein-calorie malnutrition: Secondary | ICD-10-CM | POA: Diagnosis not present

## 2023-05-29 DIAGNOSIS — R64 Cachexia: Secondary | ICD-10-CM | POA: Diagnosis not present

## 2023-05-29 DIAGNOSIS — Z7952 Long term (current) use of systemic steroids: Secondary | ICD-10-CM | POA: Insufficient documentation

## 2023-05-29 DIAGNOSIS — Z881 Allergy status to other antibiotic agents status: Secondary | ICD-10-CM | POA: Diagnosis not present

## 2023-05-29 DIAGNOSIS — C170 Malignant neoplasm of duodenum: Secondary | ICD-10-CM | POA: Diagnosis not present

## 2023-05-29 DIAGNOSIS — Z7982 Long term (current) use of aspirin: Secondary | ICD-10-CM | POA: Insufficient documentation

## 2023-05-29 DIAGNOSIS — R918 Other nonspecific abnormal finding of lung field: Secondary | ICD-10-CM | POA: Diagnosis not present

## 2023-05-29 DIAGNOSIS — Z79899 Other long term (current) drug therapy: Secondary | ICD-10-CM | POA: Insufficient documentation

## 2023-05-29 DIAGNOSIS — Z9049 Acquired absence of other specified parts of digestive tract: Secondary | ICD-10-CM | POA: Diagnosis not present

## 2023-05-29 DIAGNOSIS — I2510 Atherosclerotic heart disease of native coronary artery without angina pectoris: Secondary | ICD-10-CM | POA: Diagnosis not present

## 2023-05-29 DIAGNOSIS — I10 Essential (primary) hypertension: Secondary | ICD-10-CM | POA: Diagnosis not present

## 2023-05-29 DIAGNOSIS — Z9884 Bariatric surgery status: Secondary | ICD-10-CM | POA: Insufficient documentation

## 2023-05-29 DIAGNOSIS — J440 Chronic obstructive pulmonary disease with acute lower respiratory infection: Secondary | ICD-10-CM | POA: Diagnosis not present

## 2023-05-29 DIAGNOSIS — D500 Iron deficiency anemia secondary to blood loss (chronic): Secondary | ICD-10-CM

## 2023-05-29 DIAGNOSIS — D509 Iron deficiency anemia, unspecified: Secondary | ICD-10-CM | POA: Diagnosis not present

## 2023-05-29 DIAGNOSIS — Z8673 Personal history of transient ischemic attack (TIA), and cerebral infarction without residual deficits: Secondary | ICD-10-CM | POA: Diagnosis not present

## 2023-05-29 DIAGNOSIS — R627 Adult failure to thrive: Secondary | ICD-10-CM | POA: Diagnosis not present

## 2023-05-29 DIAGNOSIS — Z85068 Personal history of other malignant neoplasm of small intestine: Secondary | ICD-10-CM | POA: Diagnosis not present

## 2023-05-29 DIAGNOSIS — Z95828 Presence of other vascular implants and grafts: Secondary | ICD-10-CM

## 2023-05-29 DIAGNOSIS — Z66 Do not resuscitate: Secondary | ICD-10-CM | POA: Diagnosis not present

## 2023-05-29 DIAGNOSIS — Z85038 Personal history of other malignant neoplasm of large intestine: Secondary | ICD-10-CM | POA: Diagnosis not present

## 2023-05-29 DIAGNOSIS — U071 COVID-19: Secondary | ICD-10-CM | POA: Diagnosis not present

## 2023-05-29 DIAGNOSIS — Z9221 Personal history of antineoplastic chemotherapy: Secondary | ICD-10-CM | POA: Diagnosis not present

## 2023-05-29 DIAGNOSIS — C189 Malignant neoplasm of colon, unspecified: Secondary | ICD-10-CM | POA: Diagnosis not present

## 2023-05-29 DIAGNOSIS — D508 Other iron deficiency anemias: Secondary | ICD-10-CM | POA: Insufficient documentation

## 2023-05-29 DIAGNOSIS — E785 Hyperlipidemia, unspecified: Secondary | ICD-10-CM | POA: Diagnosis not present

## 2023-05-29 DIAGNOSIS — R1013 Epigastric pain: Secondary | ICD-10-CM

## 2023-05-29 DIAGNOSIS — Z515 Encounter for palliative care: Secondary | ICD-10-CM | POA: Diagnosis not present

## 2023-05-29 DIAGNOSIS — Z888 Allergy status to other drugs, medicaments and biological substances status: Secondary | ICD-10-CM | POA: Diagnosis not present

## 2023-05-29 DIAGNOSIS — D849 Immunodeficiency, unspecified: Secondary | ICD-10-CM | POA: Diagnosis not present

## 2023-05-29 DIAGNOSIS — J1282 Pneumonia due to coronavirus disease 2019: Secondary | ICD-10-CM | POA: Diagnosis not present

## 2023-05-29 LAB — CMP (CANCER CENTER ONLY)
ALT: 31 U/L (ref 0–44)
AST: 34 U/L (ref 15–41)
Albumin: 2.3 g/dL — ABNORMAL LOW (ref 3.5–5.0)
Alkaline Phosphatase: 72 U/L (ref 38–126)
Anion gap: 3 — ABNORMAL LOW (ref 5–15)
BUN: 13 mg/dL (ref 8–23)
CO2: 25 mmol/L (ref 22–32)
Calcium: 7.9 mg/dL — ABNORMAL LOW (ref 8.9–10.3)
Chloride: 111 mmol/L (ref 98–111)
Creatinine: 0.96 mg/dL (ref 0.61–1.24)
GFR, Estimated: >60 mL/min (ref >60)
Glucose, Bld: 125 mg/dL — ABNORMAL HIGH (ref 70–99)
Potassium: 4.3 mmol/L (ref 3.5–5.1)
Sodium: 139 mmol/L (ref 135–145)
Total Bilirubin: 0.9 mg/dL (ref 0.0–1.2)
Total Protein: 6 g/dL — ABNORMAL LOW (ref 6.5–8.1)

## 2023-05-29 LAB — CBC WITH DIFFERENTIAL (CANCER CENTER ONLY)
Abs Immature Granulocytes: 0.21 10*3/uL — ABNORMAL HIGH (ref 0.00–0.07)
Basophils Absolute: 0.1 10*3/uL (ref 0.0–0.1)
Basophils Relative: 0 %
Eosinophils Absolute: 0.2 10*3/uL (ref 0.0–0.5)
Eosinophils Relative: 1 %
HCT: 26.1 % — ABNORMAL LOW (ref 39.0–52.0)
Hemoglobin: 7.7 g/dL — ABNORMAL LOW (ref 13.0–17.0)
Immature Granulocytes: 1 %
Lymphocytes Relative: 7 %
Lymphs Abs: 1.1 10*3/uL (ref 0.7–4.0)
MCH: 24.5 pg — ABNORMAL LOW (ref 26.0–34.0)
MCHC: 29.5 g/dL — ABNORMAL LOW (ref 30.0–36.0)
MCV: 83.1 fL (ref 80.0–100.0)
Monocytes Absolute: 1.2 10*3/uL — ABNORMAL HIGH (ref 0.1–1.0)
Monocytes Relative: 7 %
Neutro Abs: 13.2 10*3/uL — ABNORMAL HIGH (ref 1.7–7.7)
Neutrophils Relative %: 84 %
Platelet Count: 306 10*3/uL (ref 150–400)
RBC: 3.14 MIL/uL — ABNORMAL LOW (ref 4.22–5.81)
RDW: 23.6 % — ABNORMAL HIGH (ref 11.5–15.5)
WBC Count: 16 10*3/uL — ABNORMAL HIGH (ref 4.0–10.5)
nRBC: 1.3 % — ABNORMAL HIGH (ref 0.0–0.2)

## 2023-05-29 LAB — IRON AND IRON BINDING CAPACITY (CC-WL,HP ONLY)
Iron: 17 ug/dL — ABNORMAL LOW (ref 45–182)
Saturation Ratios: 19 % (ref 17.9–39.5)
TIBC: 91 ug/dL — ABNORMAL LOW (ref 250–450)
UIBC: 74 ug/dL — ABNORMAL LOW (ref 117–376)

## 2023-05-29 MED ORDER — SODIUM CHLORIDE 0.9% FLUSH
10.0000 mL | Freq: Once | INTRAVENOUS | Status: AC
Start: 2023-05-29 — End: 2023-05-29
  Administered 2023-05-29: 10 mL

## 2023-05-29 NOTE — Telephone Encounter (Signed)
 Per secure chat on 05/29/2023 with Vincent Gros and Santiago Glad patient is aware of scheduled appointment times/dates and patient has also confirmed appointment times/dates; patient has direct contact if unable to make or needs to be rescheduled

## 2023-05-29 NOTE — Telephone Encounter (Signed)
 Called patient/patient's wife regarding appt today at 3:00 PM - noticed that an appt was also scheduled with oncology office to address the same issue (trouble swallowing meds due to cancer treatment side effect). Since this issue is oncology related and he has an appt scheduled for today, I will cancel my appt.  Arbutus Leas, PharmD, BCPS, CPP Clinical Pharmacist Practitioner [facility] [phone]

## 2023-05-29 NOTE — Telephone Encounter (Signed)
 Per secure chat on 05/29/2023 with Vincent Gros left patient a voicemail in regards to scheduled appointment for 05/31/2023 for lab work and infusion, left callback number for scheduling line if patient is having any conflicts with this scheduled appointment

## 2023-05-29 NOTE — Progress Notes (Unsigned)
 Patient Care Team: Corwin Levins, MD as PCP - General Jens Som Madolyn Frieze, MD as PCP - Cardiology (Cardiology) Iva Boop, MD as Consulting Physician (Gastroenterology) Gean Birchwood, MD as Consulting Physician (Orthopedic Surgery) Sallye Lat, MD as Consulting Physician (Ophthalmology) Malachy Mood, MD as Consulting Physician (Oncology)  Clinic Day:  05/29/2023  Referring physician: Corwin Levins, MD  ASSESSMENT & PLAN:   Assessment & Plan: No problem-specific Assessment & Plan notes found for this encounter.    The patient understands the plans discussed today and is in agreement with them.  He knows to contact our office if he develops concerns prior to his next appointment.  I provided *** minutes of face-to-face time during this encounter and > 50% was spent counseling as documented under my assessment and plan.    Carlean Jews, NP  [facility] - A DEPT OF [facility] [address] [place] [address] Dept: [phone] Dept Fax: [phone]   Orders Placed This Encounter  Procedures   DG Abd 2 Views    Standing Status:   Future    Expected Date:   05/29/2023    Expiration Date:   05/28/2024    Reason for Exam (SYMPTOM  OR DIAGNOSIS REQUIRED):   abdominal pain. reduced appetite    Preferred imaging location?:   [facility]      CHIEF COMPLAINT:  CC: ***  Current Treatment:  ***  INTERVAL HISTORY:  Aaron Moss is here today for repeat clinical assessment. He denies fevers or chills. He denies pain. His appetite is good. His weight {Weight change:10426}.  I have reviewed the past medical history, past surgical history, social history and family history with the patient and they are unchanged from previous note.  ALLERGIES:  is allergic to lyrica [pregabalin] and ciprofloxacin.  MEDICATIONS:  Current Outpatient Medications  Medication Sig Dispense Refill   aspirin EC 81 MG EC  tablet Take 1 tablet (81 mg total) by mouth daily. Swallow whole. 30 tablet 11   atorvastatin (LIPITOR) 40 MG tablet Take 1 tablet by mouth once daily 90 tablet 0   b complex vitamins capsule Take 1 capsule by mouth daily.     benazepril (LOTENSIN) 40 MG tablet Take 1 tablet by mouth once daily 90 tablet 2   carvedilol (COREG) 12.5 MG tablet TAKE 1 TABLET BY MOUTH TWICE A DAY 60 tablet 0   cyanocobalamin (VITAMIN B12) 500 MCG tablet Take 500 mcg by mouth daily.     dexamethasone (DECADRON) 4 MG tablet Take 1 tablet (4 mg total) by mouth daily. Starting the day after chemo, take for 3 to 5 days 20 tablet 0   diphenoxylate-atropine (LOMOTIL) 2.5-0.025 MG tablet Take 1-2 tablets by mouth 4 (four) times daily as needed for diarrhea or loose stools. 20 tablet 0   DULoxetine (CYMBALTA) 30 MG capsule Take 1 capsule (30 mg total) by mouth daily. 55 capsule 1   iron polysaccharides (NIFEREX) 150 MG capsule Take 1 capsule (150 mg total) by mouth 2 (two) times daily. 60 capsule 2   potassium chloride (KLOR-CON 10) 10 MEQ tablet 2 tab by mouth three times per day 180 tablet 11   potassium chloride SA (KLOR-CON M20) 20 MEQ tablet Take 1 tablet (20 mEq total) by mouth 3 (three) times daily. 90 tablet 2   prochlorperazine (COMPAZINE) 10 MG tablet Take 1 tablet (10 mg total) by mouth every 6 (six) hours as  needed for nausea or vomiting (Use for nausea and / or vomiting unresolved with ondansetron (Zofran).). 30 tablet 0   traMADol (ULTRAM) 50 MG tablet TAKE 1 TABLET BY MOUTH EVERY 12 HOURS AS NEEDED 60 tablet 2   Travoprost, BAK Free, (TRAVATAN) 0.004 % SOLN ophthalmic solution Place 1 drop into both eyes at bedtime.     VITAMIN D PO Take 2 tablets by mouth daily. Gummies     amLODipine (NORVASC) 10 MG tablet TAKE 1 TABLET BY MOUTH ONCE DAILY (Patient not taking: Reported on 05/29/2023) 90 tablet 3   No current facility-administered medications for this visit.    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 Simple appearing right renal cyst. 2. Otherwise negative abdominal ultrasound   03/18/2021 Procedure   Upper Endoscopy/Colonoscopy, Dr. Myrtie Neither  Upper Endoscopy Impression: - Normal esophagus. - A single submucosal papule (nodule) found in the stomach. - Duodenal mass. Biopsied. Concerning for malignancy.  Colonoscopy Impression: - Diverticulosis in the entire examined colon. - The examined portion of the ileum was normal. - Patent end-to-end colo-colonic anastomosis, characterized by healthy appearing mucosa. - Internal hemorrhoids. - No specimens collected.   03/18/2021 Initial Biopsy   Diagnosis Duodenum, Biopsy - INVASIVE MODERATE TO POORLY DIFFERENTIATED ADENOCARCINOMA   03/18/2021 Cancer Staging   Staging form: Small Intestine - Adenocarcinoma, AJCC 8th Edition - Clinical stage from 03/18/2021: Stage IIIA (cTX, cN1, cM0) - Signed by Malachy Mood, MD on 06/22/2021 Stage prefix: Initial diagnosis   03/22/2021 Initial Diagnosis   Adenocarcinoma of duodenum (HCC)   04/01/2021 Imaging   EXAM: CT CHEST, ABDOMEN, AND PELVIS WITH CONTRAST  IMPRESSION: 1. Circumferential wall thickening of the first portion of the duodenum, in keeping with known primary malignancy. 2. Enlarged, hypodense lymph nodes anterior to the pancreatic head and in the portacaval station, concerning for nodal metastatic disease. 3. No other evidence of metastatic disease in the chest, abdomen, or pelvis. 4. Incidental note of a fluid attenuation lesion within the proximal pancreatic body measuring 1.9 x 1.4 cm, with prominence of  the pancreatic duct distally, up to 0.6 cm. This lesion was present on remote prior examination dated 05/04/2009, and has slightly increased in size over a long period of time. This is consistent with a small IPMN and benign given indolent behavior over greater than 10 years. 5. Background of very fine centrilobular pulmonary nodules, most concentrated in the lung apices, most commonly seen in smoking-related respiratory bronchiolitis. 6. Coronary artery disease.   Aortic Atherosclerosis (ICD10-I70.0).    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 Of note, a variant of uncertain significance was detected in the ATM (p.A869G), BLM (c.800-3T>G), and SDHA (p.V632F) genes. Report date is 04/27/2021.  The CancerNext-Expanded gene panel offered by [facility] and includes sequencing, rearrangement, and RNA analysis for the following 77 genes: AIP, ALK, APC, ATM, AXIN2, BAP1, BARD1, BLM, BMPR1A, BRCA1, BRCA2, BRIP1, CDC73, CDH1, CDK4, CDKN1B, CDKN2A, CHEK2, CTNNA1, DICER1, FANCC, FH, FLCN, GALNT12, KIF1B, LZTR1, MAX, MEN1, MET, MLH1, MSH2, MSH3, MSH6, MUTYH, NBN, NF1, NF2, NTHL1, PALB2,  PHOX2B, PMS2, POT1, PRKAR1A, PTCH1, PTEN, RAD51C, RAD51D, RB1, RECQL, RET, SDHA, SDHAF2, SDHB, SDHC, SDHD, SMAD4, SMARCA4, SMARCB1, SMARCE1, STK11, SUFU, TMEM127, TP53, TSC1, TSC2, VHL and XRCC2 (sequencing and deletion/duplication); EGFR, EGLN1, HOXB13, KIT, MITF, PDGFRA, POLD1, and POLE (sequencing only); EPCAM and GREM1 (deletion/duplication only).    05/11/2021 - 09/28/2021 Chemotherapy   Patient is on Treatment Plan : COLORECTAL Pembrolizumab (200) q21d     11/24/2021 - 05/03/2022 Chemotherapy   Patient is on Treatment Plan : COLORECTAL FOLFOX q14d x 4 months     02/20/2022 Imaging    IMPRESSION: 1. Distal stomach/duodenal mass is decreased in size when compared with prior exam. 2. Stable upper abdominal  adenopathy. 3. 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 4. Aortic Atherosclerosis (ICD10-I70.0).   05/15/2022 Imaging    IMPRESSION: Improved masslike enlargement of the duodenal as well as fold nodularity and thickening along the stomach. In addition there are some small nodes identified in the upper abdomen which are similar overall compared to the study of December 2023   However there is a new spiculated nodule in the middle lobe which is worrisome. In addition there are some small low-attenuation liver lesions which are too small to completely characterize but were not clearly seen on the previous examination. Recommend further evaluation. For the liver lesions in particular a Eovist liver MRI may be of some benefit as clinically directed.   Persistent cystic lesion along the midbody of the pancreas with associated duct dilatation. A cystic neoplasm of the pancreas is possible.   06/06/2022 Imaging   MR Abdomen W Wo Contrast     IMPRESSION: 1. No definite evidence of hepatic metastatic disease. Scattered punctate foci of T2 hyperintensity throughout the liver are not associated with any suspicious enhancement or restricted diffusion and are favored to reflect small cysts or biliary hamartomas. Some of these are suboptimally characterized based on their small size, and given nonvisualization on older prior studies, attention on follow-up CT in 3-6 months recommended. 2. Unchanged residual irregular thickening of the walls of the distal stomach and proximal duodenum. 3. 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 This lesion is unchanged in size from recent CT, although has slowly enlarged from older prior examinations. Recommend follow-up abdominal MRI in 1 year. 4. Stable mild  extrahepatic biliary dilatation status post cholecystectomy. 5. Aortic and branch vessel atherosclerosis, better seen on CT.   09/07/2022 - 05/05/2023 Chemotherapy   Patient is on Treatment Plan : COLORECTAL FOLFIRI q14d     04/04/2023 Imaging   CT chest abdomen and pelvis with contrast IMPRESSION: Significant increase in size of soft tissue mass involving the distal stomach and proximal duodenum with the extension of the abnormal soft tissue extending into the porta hepatis. Additional adjacent small lymph nodes in the adjacent mesentery are suspicious. Stable prominent right hilar lymph node and small mediastinal nodes. These similar to previous.   Increasing size cystic pancreatic lesion centrally with the ductal dilatation. Please correlate for a cystic pancreatic neoplasm.     06/07/2023 -  Chemotherapy   Patient is on Treatment Plan : COLORECTAL Nivolumab (3) + Ipilimumab (1) q21d / Nivolumab (240) q14d         REVIEW OF SYSTEMS:   Constitutional: Denies fevers, chills or abnormal weight loss Eyes: Denies blurriness of vision Ears, nose, mouth, throat, and face: Denies mucositis  or sore throat Respiratory: Denies cough, dyspnea or wheezes Cardiovascular: Denies palpitation, chest discomfort or lower extremity swelling Gastrointestinal:  Denies nausea, heartburn or change in bowel habits Skin: Denies abnormal skin rashes Lymphatics: Denies new lymphadenopathy or easy bruising Neurological:Denies numbness, tingling or new weaknesses Behavioral/Psych: Mood is stable, no new changes  All other systems were reviewed with the patient and are negative.   VITALS:  Blood pressure 126/60, pulse 85, temperature 98 F (36.7 C), temperature source Temporal, resp. rate 17, weight 200 lb 8 oz (90.9 kg), SpO2 95%.  Wt Readings from Last 3 Encounters:  05/29/23 200 lb 8 oz (90.9 kg)  05/17/23 204 lb 9.6 oz (92.8 kg)  05/03/23 214 lb 14.4 oz (97.5 kg)    Body mass index is 32.36  kg/m.  Performance status (ECOG): {CHL ONC [id number]  PHYSICAL EXAM:   GENERAL:alert, no distress and comfortable SKIN: skin color, texture, turgor are normal, no rashes or significant lesions EYES: normal, Conjunctiva are pink and non-injected, sclera clear OROPHARYNX:no exudate, no erythema and lips, buccal mucosa, and tongue normal  NECK: supple, thyroid normal size, non-tender, without nodularity LYMPH:  no palpable lymphadenopathy in the cervical, axillary or inguinal LUNGS: clear to auscultation and percussion with normal breathing effort HEART: regular rate & rhythm and no murmurs and no lower extremity edema ABDOMEN:abdomen soft, non-tender and normal bowel sounds Musculoskeletal:no cyanosis of digits and no clubbing  NEURO: alert & oriented x 3 with fluent speech, no focal motor/sensory deficits  LABORATORY DATA:  I have reviewed the data as listed    Component Value Date/Time   NA 139 05/29/2023 1438   K 4.3 05/29/2023 1438   CL 111 05/29/2023 1438   CO2 25 05/29/2023 1438   GLUCOSE 125 (H) 05/29/2023 1438   BUN 13 05/29/2023 1438   CREATININE 0.96 05/29/2023 1438   CALCIUM 7.9 (L) 05/29/2023 1438   PROT 6.0 (L) 05/29/2023 1438   ALBUMIN 2.3 (L) 05/29/2023 1438   AST 34 05/29/2023 1438   ALT 31 05/29/2023 1438   ALKPHOS 72 05/29/2023 1438   BILITOT 0.9 05/29/2023 1438   GFRNONAA >60 05/29/2023 1438   GFRAA >60 02/24/2019 0207    No results found for: "SPEP", "UPEP"  Lab Results  Component Value Date   WBC 16.0 (H) 05/29/2023   NEUTROABS 13.2 (H) 05/29/2023   HGB 7.7 (L) 05/29/2023   HCT 26.1 (L) 05/29/2023   MCV 83.1 05/29/2023   PLT 306 05/29/2023      Chemistry      Component Value Date/Time   NA 139 05/29/2023 1438   K 4.3 05/29/2023 1438   CL 111 05/29/2023 1438   CO2 25 05/29/2023 1438   BUN 13 05/29/2023 1438   CREATININE 0.96 05/29/2023 1438      Component Value Date/Time   CALCIUM 7.9 (L) 05/29/2023 1438   ALKPHOS 72 05/29/2023  1438   AST 34 05/29/2023 1438   ALT 31 05/29/2023 1438   BILITOT 0.9 05/29/2023 1438       RADIOGRAPHIC STUDIES: I have personally reviewed the radiological images as listed and agreed with the findings in the report. No results found.

## 2023-05-30 ENCOUNTER — Ambulatory Visit (HOSPITAL_COMMUNITY)
Admission: RE | Admit: 2023-05-30 | Discharge: 2023-05-30 | Disposition: A | Discharge status: Home / Self Care | Source: Ambulatory Visit | Attending: Nurse Practitioner | Admitting: Nurse Practitioner

## 2023-05-30 DIAGNOSIS — R1013 Epigastric pain: Secondary | ICD-10-CM

## 2023-05-30 DIAGNOSIS — C189 Malignant neoplasm of colon, unspecified: Secondary | ICD-10-CM | POA: Diagnosis not present

## 2023-05-30 DIAGNOSIS — C170 Malignant neoplasm of duodenum: Secondary | ICD-10-CM

## 2023-05-30 LAB — CEA (ACCESS): CEA (CHCC): 10.69 ng/mL — ABNORMAL HIGH (ref 0.00–5.00)

## 2023-05-30 MED ORDER — IOHEXOL 300 MG/ML  SOLN
100.0000 mL | Freq: Once | INTRAMUSCULAR | Status: AC | PRN
  Administered 2023-05-30: 100 mL via INTRAVENOUS

## 2023-05-30 MED ORDER — IOHEXOL 300 MG/ML  SOLN
30.0000 mL | Freq: Once | INTRAMUSCULAR | Status: AC | PRN
  Administered 2023-05-30: 30 mL via ORAL

## 2023-05-30 MED ORDER — HEPARIN SOD (PORK) LOCK FLUSH 100 UNIT/ML IV SOLN
500.0000 [IU] | Freq: Once | INTRAVENOUS | Status: AC
  Administered 2023-05-30: 500 [IU] via INTRAVENOUS

## 2023-05-31 ENCOUNTER — Encounter (HOSPITAL_COMMUNITY): Payer: Self-pay | Admitting: Internal Medicine

## 2023-05-31 ENCOUNTER — Inpatient Hospital Stay: Payer: Medicare Other

## 2023-05-31 ENCOUNTER — Inpatient Hospital Stay: Payer: Medicare Other | Admitting: Hematology

## 2023-05-31 ENCOUNTER — Inpatient Hospital Stay

## 2023-05-31 ENCOUNTER — Inpatient Hospital Stay: Payer: Medicare Other | Admitting: Dietician

## 2023-05-31 ENCOUNTER — Encounter: Payer: Self-pay | Admitting: Nurse Practitioner

## 2023-05-31 ENCOUNTER — Other Ambulatory Visit: Payer: Self-pay

## 2023-05-31 ENCOUNTER — Encounter (HOSPITAL_COMMUNITY): Payer: Self-pay

## 2023-05-31 ENCOUNTER — Encounter: Payer: Self-pay | Admitting: Hematology

## 2023-05-31 ENCOUNTER — Inpatient Hospital Stay (HOSPITAL_COMMUNITY)
Admission: AD | Admit: 2023-05-31 | Discharge: 2023-06-03 | DRG: 177 | Disposition: A | Discharge status: Home / Self Care | Source: Ambulatory Visit | Attending: Internal Medicine | Admitting: Internal Medicine

## 2023-05-31 VITALS — BP 160/76 | HR 99 | Temp 98.0°F | Resp 20

## 2023-05-31 DIAGNOSIS — Z85038 Personal history of other malignant neoplasm of large intestine: Secondary | ICD-10-CM

## 2023-05-31 DIAGNOSIS — R64 Cachexia: Secondary | ICD-10-CM | POA: Diagnosis present

## 2023-05-31 DIAGNOSIS — Z833 Family history of diabetes mellitus: Secondary | ICD-10-CM

## 2023-05-31 DIAGNOSIS — D509 Iron deficiency anemia, unspecified: Secondary | ICD-10-CM | POA: Diagnosis present

## 2023-05-31 DIAGNOSIS — Z87891 Personal history of nicotine dependence: Secondary | ICD-10-CM

## 2023-05-31 DIAGNOSIS — Z881 Allergy status to other antibiotic agents status: Secondary | ICD-10-CM

## 2023-05-31 DIAGNOSIS — E860 Dehydration: Secondary | ICD-10-CM

## 2023-05-31 DIAGNOSIS — Z9884 Bariatric surgery status: Secondary | ICD-10-CM

## 2023-05-31 DIAGNOSIS — Z800 Family history of malignant neoplasm of digestive organs: Secondary | ICD-10-CM

## 2023-05-31 DIAGNOSIS — U071 COVID-19: Principal | ICD-10-CM | POA: Diagnosis present

## 2023-05-31 DIAGNOSIS — E876 Hypokalemia: Secondary | ICD-10-CM | POA: Diagnosis present

## 2023-05-31 DIAGNOSIS — Z7982 Long term (current) use of aspirin: Secondary | ICD-10-CM

## 2023-05-31 DIAGNOSIS — Z9221 Personal history of antineoplastic chemotherapy: Secondary | ICD-10-CM

## 2023-05-31 DIAGNOSIS — Z66 Do not resuscitate: Secondary | ICD-10-CM | POA: Diagnosis present

## 2023-05-31 DIAGNOSIS — E785 Hyperlipidemia, unspecified: Secondary | ICD-10-CM | POA: Diagnosis present

## 2023-05-31 DIAGNOSIS — Z801 Family history of malignant neoplasm of trachea, bronchus and lung: Secondary | ICD-10-CM

## 2023-05-31 DIAGNOSIS — E46 Unspecified protein-calorie malnutrition: Secondary | ICD-10-CM | POA: Diagnosis present

## 2023-05-31 DIAGNOSIS — Z95828 Presence of other vascular implants and grafts: Secondary | ICD-10-CM

## 2023-05-31 DIAGNOSIS — D500 Iron deficiency anemia secondary to blood loss (chronic): Secondary | ICD-10-CM

## 2023-05-31 DIAGNOSIS — J1282 Pneumonia due to coronavirus disease 2019: Secondary | ICD-10-CM | POA: Diagnosis present

## 2023-05-31 DIAGNOSIS — C170 Malignant neoplasm of duodenum: Secondary | ICD-10-CM

## 2023-05-31 DIAGNOSIS — Z79899 Other long term (current) drug therapy: Secondary | ICD-10-CM

## 2023-05-31 DIAGNOSIS — Z923 Personal history of irradiation: Secondary | ICD-10-CM

## 2023-05-31 DIAGNOSIS — Z9049 Acquired absence of other specified parts of digestive tract: Secondary | ICD-10-CM

## 2023-05-31 DIAGNOSIS — Z8546 Personal history of malignant neoplasm of prostate: Secondary | ICD-10-CM

## 2023-05-31 DIAGNOSIS — R0902 Hypoxemia: Secondary | ICD-10-CM | POA: Diagnosis present

## 2023-05-31 DIAGNOSIS — Z6832 Body mass index (BMI) 32.0-32.9, adult: Secondary | ICD-10-CM

## 2023-05-31 DIAGNOSIS — Z888 Allergy status to other drugs, medicaments and biological substances status: Secondary | ICD-10-CM

## 2023-05-31 DIAGNOSIS — R627 Adult failure to thrive: Secondary | ICD-10-CM | POA: Diagnosis present

## 2023-05-31 DIAGNOSIS — D849 Immunodeficiency, unspecified: Secondary | ICD-10-CM | POA: Diagnosis present

## 2023-05-31 DIAGNOSIS — Z85068 Personal history of other malignant neoplasm of small intestine: Secondary | ICD-10-CM

## 2023-05-31 DIAGNOSIS — Z803 Family history of malignant neoplasm of breast: Secondary | ICD-10-CM

## 2023-05-31 DIAGNOSIS — J440 Chronic obstructive pulmonary disease with acute lower respiratory infection: Secondary | ICD-10-CM | POA: Diagnosis present

## 2023-05-31 DIAGNOSIS — R918 Other nonspecific abnormal finding of lung field: Principal | ICD-10-CM | POA: Diagnosis present

## 2023-05-31 DIAGNOSIS — R530 Neoplastic (malignant) related fatigue: Secondary | ICD-10-CM | POA: Diagnosis present

## 2023-05-31 DIAGNOSIS — I10 Essential (primary) hypertension: Secondary | ICD-10-CM | POA: Diagnosis present

## 2023-05-31 DIAGNOSIS — Z8673 Personal history of transient ischemic attack (TIA), and cerebral infarction without residual deficits: Secondary | ICD-10-CM

## 2023-05-31 DIAGNOSIS — Z515 Encounter for palliative care: Secondary | ICD-10-CM

## 2023-05-31 DIAGNOSIS — I2510 Atherosclerotic heart disease of native coronary artery without angina pectoris: Secondary | ICD-10-CM | POA: Diagnosis present

## 2023-05-31 LAB — STREP PNEUMONIAE URINARY ANTIGEN: Strep Pneumo Urinary Antigen: NEGATIVE

## 2023-05-31 LAB — CMP (CANCER CENTER ONLY)
ALT: 30 U/L (ref 0–44)
AST: 36 U/L (ref 15–41)
Albumin: 1.9 g/dL — ABNORMAL LOW (ref 3.5–5.0)
Alkaline Phosphatase: 76 U/L (ref 38–126)
Anion gap: 6 (ref 5–15)
BUN: 15 mg/dL (ref 8–23)
CO2: 23 mmol/L (ref 22–32)
Calcium: 8.5 mg/dL — ABNORMAL LOW (ref 8.9–10.3)
Chloride: 108 mmol/L (ref 98–111)
Creatinine: 1 mg/dL (ref 0.61–1.24)
GFR, Estimated: >60 mL/min (ref >60)
Glucose, Bld: 94 mg/dL (ref 70–99)
Potassium: 4.3 mmol/L (ref 3.5–5.1)
Sodium: 137 mmol/L (ref 135–145)
Total Bilirubin: 1.2 mg/dL (ref 0.0–1.2)
Total Protein: 6.5 g/dL (ref 6.5–8.1)

## 2023-05-31 LAB — RESP PANEL BY RT-PCR (RSV, FLU A&B, COVID)  RVPGX2
Influenza A by PCR: NEGATIVE
Influenza B by PCR: NEGATIVE
Resp Syncytial Virus by PCR: NEGATIVE
SARS Coronavirus 2 by RT PCR: POSITIVE — AB

## 2023-05-31 LAB — CBC WITH DIFFERENTIAL (CANCER CENTER ONLY)
Abs Immature Granulocytes: 0.18 10*3/uL — ABNORMAL HIGH (ref 0.00–0.07)
Basophils Absolute: 0.1 10*3/uL (ref 0.0–0.1)
Basophils Relative: 0 %
Eosinophils Absolute: 0.3 10*3/uL (ref 0.0–0.5)
Eosinophils Relative: 1 %
HCT: 28.1 % — ABNORMAL LOW (ref 39.0–52.0)
Hemoglobin: 8.3 g/dL — ABNORMAL LOW (ref 13.0–17.0)
Immature Granulocytes: 1 %
Lymphocytes Relative: 6 %
Lymphs Abs: 1.2 10*3/uL (ref 0.7–4.0)
MCH: 24.3 pg — ABNORMAL LOW (ref 26.0–34.0)
MCHC: 29.5 g/dL — ABNORMAL LOW (ref 30.0–36.0)
MCV: 82.2 fL (ref 80.0–100.0)
Monocytes Absolute: 1.3 10*3/uL — ABNORMAL HIGH (ref 0.1–1.0)
Monocytes Relative: 6 %
Neutro Abs: 18.1 10*3/uL — ABNORMAL HIGH (ref 1.7–7.7)
Neutrophils Relative %: 86 %
Platelet Count: 350 10*3/uL (ref 150–400)
RBC: 3.42 MIL/uL — ABNORMAL LOW (ref 4.22–5.81)
RDW: 23.5 % — ABNORMAL HIGH (ref 11.5–15.5)
WBC Count: 21.2 10*3/uL — ABNORMAL HIGH (ref 4.0–10.5)
nRBC: 0.4 % — ABNORMAL HIGH (ref 0.0–0.2)

## 2023-05-31 LAB — MRSA NEXT GEN BY PCR, NASAL: MRSA by PCR Next Gen: NOT DETECTED

## 2023-05-31 LAB — FERRITIN: Ferritin: 414 ng/mL — ABNORMAL HIGH (ref 24–336)

## 2023-05-31 MED ORDER — ENSURE ENLIVE PO LIQD
237.0000 mL | Freq: Three times a day (TID) | ORAL | Status: DC
  Administered 2023-05-31 – 2023-06-03 (×5): 237 mL via ORAL

## 2023-05-31 MED ORDER — POLYSACCHARIDE IRON COMPLEX 150 MG PO CAPS
150.0000 mg | ORAL_CAPSULE | Freq: Two times a day (BID) | ORAL | Status: DC
  Administered 2023-05-31 – 2023-06-03 (×6): 150 mg via ORAL
  Filled 2023-05-31 (×7): qty 1

## 2023-05-31 MED ORDER — METHYLPREDNISOLONE SODIUM SUCC 40 MG IJ SOLR
40.0000 mg | Freq: Every day | INTRAMUSCULAR | Status: DC
  Administered 2023-05-31 – 2023-06-03 (×4): 40 mg via INTRAVENOUS
  Filled 2023-05-31 (×4): qty 1

## 2023-05-31 MED ORDER — SODIUM CHLORIDE 0.9 % IV SOLN
INTRAVENOUS | Status: DC
Start: 2023-05-31 — End: 2023-05-31

## 2023-05-31 MED ORDER — ACETAMINOPHEN 650 MG RE SUPP
650.0000 mg | Freq: Four times a day (QID) | RECTAL | Status: DC | PRN
Start: 2023-05-31 — End: 2023-06-03

## 2023-05-31 MED ORDER — ACETAMINOPHEN 325 MG PO TABS: 650.0000 mg | ORAL_TABLET | Freq: Four times a day (QID) | ORAL | Status: DC | PRN

## 2023-05-31 MED ORDER — CARVEDILOL 12.5 MG PO TABS
12.5000 mg | ORAL_TABLET | Freq: Two times a day (BID) | ORAL | Status: DC
  Administered 2023-05-31 – 2023-06-03 (×6): 12.5 mg via ORAL
  Filled 2023-05-31: qty 1
  Filled 2023-05-31 (×4): qty 4
  Filled 2023-05-31: qty 1

## 2023-05-31 MED ORDER — BENAZEPRIL HCL 20 MG PO TABS
40.0000 mg | ORAL_TABLET | Freq: Every day | ORAL | Status: DC
  Administered 2023-06-01 – 2023-06-03 (×3): 40 mg via ORAL
  Filled 2023-05-31 (×4): qty 2

## 2023-05-31 MED ORDER — TRAZODONE HCL 50 MG PO TABS: 25.0000 mg | ORAL_TABLET | Freq: Every evening | ORAL | Status: DC | PRN

## 2023-05-31 MED ORDER — SODIUM CHLORIDE 0.9 % IV SOLN
500.0000 mg | INTRAVENOUS | Status: DC
  Administered 2023-05-31 – 2023-06-02 (×3): 500 mg via INTRAVENOUS
  Filled 2023-05-31 (×4): qty 5

## 2023-05-31 MED ORDER — ALBUTEROL SULFATE (2.5 MG/3ML) 0.083% IN NEBU: 2.5000 mg | INHALATION_SOLUTION | RESPIRATORY_TRACT | Status: DC | PRN

## 2023-05-31 MED ORDER — VANCOMYCIN HCL IN DEXTROSE 1-5 GM/200ML-% IV SOLN
1000.0000 mg | INTRAVENOUS | Status: DC
  Administered 2023-06-01 – 2023-06-02 (×2): 1000 mg via INTRAVENOUS
  Filled 2023-05-31 (×2): qty 200

## 2023-05-31 MED ORDER — SODIUM CHLORIDE 0.9 % IV SOLN
2.0000 g | Freq: Three times a day (TID) | INTRAVENOUS | Status: DC
  Administered 2023-05-31 – 2023-06-03 (×9): 2 g via INTRAVENOUS
  Filled 2023-05-31 (×9): qty 12.5

## 2023-05-31 MED ORDER — VANCOMYCIN HCL 1750 MG/350ML IV SOLN
1750.0000 mg | Freq: Once | INTRAVENOUS | Status: AC
Start: 2023-05-31 — End: 2023-05-31
  Administered 2023-05-31: 1750 mg via INTRAVENOUS
  Filled 2023-05-31: qty 350

## 2023-05-31 MED ORDER — ONDANSETRON HCL 4 MG PO TABS: 4.0000 mg | ORAL_TABLET | Freq: Four times a day (QID) | ORAL | Status: DC | PRN

## 2023-05-31 MED ORDER — DULOXETINE HCL 30 MG PO CPEP
30.0000 mg | ORAL_CAPSULE | Freq: Every day | ORAL | Status: DC
  Administered 2023-06-01 – 2023-06-03 (×3): 30 mg via ORAL
  Filled 2023-05-31 (×4): qty 1

## 2023-05-31 MED ORDER — ONDANSETRON HCL 4 MG/2ML IJ SOLN: 4.0000 mg | Freq: Four times a day (QID) | INTRAMUSCULAR | Status: DC | PRN

## 2023-05-31 MED ORDER — SODIUM CHLORIDE 0.9 % IV SOLN: INTRAVENOUS | Status: AC

## 2023-05-31 MED ORDER — ENOXAPARIN SODIUM 40 MG/0.4ML IJ SOSY
40.0000 mg | PREFILLED_SYRINGE | INTRAMUSCULAR | Status: DC
  Administered 2023-05-31 – 2023-06-02 (×3): 40 mg via SUBCUTANEOUS
  Filled 2023-05-31 (×3): qty 0.4

## 2023-05-31 MED ORDER — SODIUM CHLORIDE 0.9% FLUSH
10.0000 mL | Freq: Once | INTRAVENOUS | Status: AC
  Administered 2023-05-31: 10 mL

## 2023-05-31 MED ORDER — IRON SUCROSE 20 MG/ML IV SOLN
200.0000 mg | Freq: Once | INTRAVENOUS | Status: AC
  Administered 2023-05-31: 200 mg via INTRAVENOUS
  Filled 2023-05-31: qty 10

## 2023-05-31 NOTE — Assessment & Plan Note (Signed)
 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 Treatment delayed due to his stroke on 04/18/21. S/p 8 cycles neoadjuvant Keytruda 05/11/21 - 09/28/21. He tolerated well with mild fatigue.  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 -He began FOLFOX on 11/24/21. He has tolerated well overall with some taste changes and mild fatigue -restaging CT scan 02/20/2022 showed PR -restaging CT from 05/15/2022 showed improved masslike enlargement of the duodenal as well as fold nodularity and thickening along the stomach, however it showed a new liver lesion, indeterminate, will obtain liver MRI  -Patient complained of worsening fatigue, last cycle chemo was held on 05/18/22. -Liver MRI in March 2024 showed benign liver lesions and a cystic lesion in pancreas, no evidence of liver metastasis.   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  No evidence of distant metastasis. -Due to the cancer progression, and his persistent peripheral neuropathy, I recommend change treatment to second line FOLFIRI, he started on 09/07/22, he is tolerating well so far  -Due to his moderate fatigue, I reduced irinotecan dose slightly from C3 -His performance status has been low due to significant fatigue, required chemo dose reduction and chemo break  --Restaging CT scan from December 04, 2022 showed excellent response, the primary duodenal mass is not really measurable, no other signs of metastasis. -I changed his treatment to maintenance irrinotecan alone on  12/21/2022. He is tolerating better -due to mild disease progression, his chemo back to FOLFIRI on 04/05/2023.

## 2023-05-31 NOTE — Consult Note (Signed)
 NAME:  Aaron Moss, MRN:  [id number], DOB:  08/10/1945, LOS: 1 ADMISSION DATE:  05/31/2023, CONSULTATION DATE:  05/31/23 REFERRING MD:  TRH, CHIEF COMPLAINT:  abnormal chest CT   History of Present Illness:   50 yoM with PMH as below significant for former smoker, duodenal adenocarcinoma, HTN, HLD, anemia and GERD who was sent by oncology for admit given abnormal chest CT with progressive fatigue, postural dizziness with fall at home, ongoing poor appetite/poor PO intake and exertional SOB. Found in oncology clinic to have exertional sats 87-88% which recovered after rest.  Treated with IVF, venofer, and given reports of progressive symptoms and abdominal gas sent for repeat CT.  CT CAP w/contrast 3/12 showing new GGO and reticular infiltrates with scattered consolidation bilaterally compared to CT in January.  Due to changes, oncology recommended pulmonary consult for further input.   Pt followed by Dr. Mosetta Putt for adenocarcinoma of duodenum, initially diagnosed 02/2021.  Previously on FOLFOX, changed to Xeloda 04/2022 but given disease progression changed to FOLFIRI 08/2022 with good response, changed to irrinotecan 10/24 but due to disease progression again, changed back to FOLFIRI 04/05/2023, but off therapy since 2/13 due to poor tolerance.  CT CAP 03/2023 with stable right hilar lymph nodes and small mediastinal nodes, noted then to have significant increase in soft tissue mass involving the distal stomach, proximal duodenum, and increasing size of cystic pancreatic lesion.  In the last 2-3 weeks, reports more exertional SOB with ongoing severe fatigue and no appetite.  No fever, chills, change in cough or sputum, vomiting, or recent travel.  Former smoker, quit 30-35 yrs ago, 1ppd when he did smoke.  Worked in Facilities manager. No pets.  Denies prior lung history/ issues.  Noted 10 lb wt loss since January.  Pertinent  Medical History   Past Medical History:  Diagnosis Date   Anemia  10/31/2021   Dx IDA   Carotid stenosis, left    Colon cancer (HCC) 2000   Coronary artery disease    per Dr. 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, volume 11.5 ml   Stroke (HCC) 04/18/2021   TIA (transient ischemic attack)    Significant Hospital Events: Including procedures, antibiotic start and stop dates in addition to other pertinent events     Interim History / Subjective:   Objective   There were no vitals taken for this visit.       No intake or output data in the 24 hours ending 05/31/23 1532 There were no vitals filed for this visit.  Examination: General:  chronically ill appearing older male lying in bed in NAD HEENT: MM pink/moist, no JVD Neuro: Awake, oriented/ appropriate, non-focal, generalized weakness CV: rr, no murmur, port in left chest accessed PULM:  non labored, CTA, no wheeze, RA GI: soft, bs+, ND Extremities: warm/dry, no tibial edema but +1 ankle edema Skin: no rashes   Labs> CMET unremarkable except albumin 1.9, ferritin 414, WBC 21.2, H/H 8.3/ 28  Resolved Hospital Problem list    Assessment & Plan:   Bilateral ground-  glass and reticular infiltrates with scattered consolidation Leukocytoses, worsening - new changes from CT CAP 03/2023, no specific pattern - unclear underlying etiology> no infectious symptoms, fever, change in cough to suggest infection but given immunosuppression and worsening AoC leukocytosis, infection can not be ruled out.  Will check MRSA PCR, urine strep, legionella, trend PCT and start empiric nosocomial abx for now.  If able to produce sputum, send  for cx - viral panel pending - Previously on FOLFIR which can cause organizing PNA.  Steroids discussed with pt, will start solumedrol 40mg  daily - if no improvement, can consider bronchoscopy - severe deconditioning/ FTT could be contributing to exertional SOB/ transient hypoxia.  Currently on room air at rest.  Prn O2 - PT/ OT, maximize nutrition as able  Remainder per primary team.  Pulmonary will continue to follow.   Duodenal adenocarcinoma- oncology following HTN IDA- s/p venofer 3/12 Protein calorie Malnutrition Deconditioning  Best Practice (right click and "Reselect all SmartList Selections" daily)  Per primary.    Labs   CBC: Recent Labs  Lab 05/29/23 1438 05/31/23 0812  WBC 16.0* 21.2*  NEUTROABS 13.2* 18.1*  HGB 7.7* 8.3*  HCT 26.1* 28.1*  MCV 83.1 82.2  PLT 306 350    Basic Metabolic Panel: Recent Labs  Lab 05/29/23 1438 05/31/23 0812  NA 139 137  K 4.3 4.3  CL 111 108  CO2 25 23  GLUCOSE 125* 94  BUN 13 15  CREATININE 0.96 1.00  CALCIUM 7.9* 8.5*   GFR: Estimated Creatinine Clearance: 63.2 mL/min (by C-G formula based on SCr of 1 mg/dL). Recent Labs  Lab 05/29/23 1438 05/31/23 0812  WBC 16.0* 21.2*    Liver Function Tests: Recent Labs  Lab 05/29/23 1438 05/31/23 0812  AST 34 36  ALT 31 30  ALKPHOS 72 76  BILITOT 0.9 1.2  PROT 6.0* 6.5  ALBUMIN 2.3* 1.9*   No results for input(s): "LIPASE", "AMYLASE" in the last 168 hours. No results for input(s): "AMMONIA" in the last 168 hours.  ABG    Component Value Date/Time   PHART 7.365 10/19/2021 1034   PCO2ART 47.5 10/19/2021 1034   PO2ART 188 (H) 10/19/2021 1034   HCO3 27.1 10/19/2021 1034   TCO2 29 10/19/2021 1034   O2SAT 100 10/19/2021 1034     Coagulation Profile: No results for input(s): "INR", "PROTIME" in the last 168 hours.  Cardiac Enzymes: No results for input(s): "CKTOTAL", "CKMB", "CKMBINDEX", "TROPONINI" in the last 168 hours.  HbA1C: Hgb A1c MFr Bld  Date/Time  Value Ref Range Status  05/22/2022 09:37 AM 5.7 4.6 - 6.5 % Final    Comment:    Glycemic Control Guidelines for People with Diabetes:Non Diabetic:  <6%Goal of Therapy: <7%Additional Action Suggested:  >8%   10/12/2021 10:37 AM 5.6 4.8 - 5.6 % Final    Comment:    (NOTE) Pre diabetes:          5.7%-6.4%  Diabetes:              >6.4%  Glycemic control for   <7.0% adults with diabetes     CBG: No results for input(s): "GLUCAP" in the last 168 hours.  Review of Systems:   Review of Systems  Constitutional:  Positive for malaise/fatigue and weight loss. Negative for chills and fever.  Respiratory:  Positive for shortness of breath. Negative for cough, hemoptysis, sputum production and wheezing.   Cardiovascular:  Positive for leg swelling. Negative for chest pain.  Gastrointestinal:  Negative for  nausea and vomiting.  Musculoskeletal:  Positive for falls.  Neurological:  Positive for weakness. Negative for focal weakness.   Past Medical History:  He,  has a past medical history of Anemia (10/31/2021), Carotid stenosis, left, Colon cancer (HCC) (2000), Coronary artery disease, Diverticulosis of colon, Duodenal cancer (HCC) (01/2021), GERD (gastroesophageal reflux disease), Glaucoma, History of chemotherapy (2000), History of radiation therapy (08/06/13- 10/03/13), Hyperlipidemia, Hypertension, Left knee DJD, Prostate cancer (HCC) (05/23/2013), Stroke (HCC) (04/18/2021), and TIA (transient ischemic attack).   Surgical History:   Past Surgical History:  Procedure Laterality Date   CHOLECYSTECTOMY N/A 10/19/2021   Procedure: CHOLECYSTECTOMY;  Surgeon: Almond Lint, MD;  Location: [facility] OR;  Service: General;  Laterality: N/A;   COLON SURGERY  2000   colon cancer   COLONOSCOPY     ENDARTERECTOMY Left 04/25/2021   Procedure: LEFT ENDARTERECTOMY CAROTID;  Surgeon: Cephus Shelling, MD;  Location: [facility] OR;  Service: Vascular;  Laterality: Left;   EUS     pancreatic cyst   KNEE ARTHROSCOPY   2007   LT- [facility]   LAPAROSCOPY N/A 10/19/2021   Procedure: LAPAROSCOPY DIAGNOSTIC;  Surgeon: Almond Lint, MD;  Location: [facility] OR;  Service: General;  Laterality: N/A;  GENERAL AND TAP BLOCK   PATCH ANGIOPLASTY Left 04/25/2021   Procedure: PATCH ANGIOPLASTY XENOSURE 1CM X 6CM;  Surgeon: Cephus Shelling, MD;  Location: [facility];  Service: Vascular;  Laterality: Left;   PORTACATH PLACEMENT Left 11/17/2021   Procedure: PORT PLACEMENT;  Surgeon: Almond Lint, MD;  Location: [facility] OR;  Service: General;  Laterality: Left;   PROSTATE BIOPSY  05/23/13   gleason 7, volume 11.5 ml   WHIPPLE PROCEDURE N/A 10/19/2021   Procedure: ATTEMPTED WHIPPLE PROCEDURE  WITH GASTRODUODENAL BYPASS;  Surgeon: Almond Lint, MD;  Location: [facility] OR;  Service: General;  Laterality: N/A;  GENERAL AND TAP BLOCK     Social History:   reports that he quit smoking about 35 years ago. His smoking use included cigarettes. He started smoking about 57 years ago. He has a 22 pack-year smoking history. He has never used smokeless tobacco. He reports that he does not currently use alcohol. He reports that he does not use drugs.   Family History:  His family history includes Breast cancer in his maternal grandmother; Cancer in his brother, maternal aunt, and mother; Cancer (age of onset: 23) in his daughter; Colon cancer (age of onset: 44) in his sister; Diabetes in his brother. There is no history of Esophageal cancer, Rectal cancer, or Stomach cancer.   Allergies Allergies  Allergen Reactions   Lyrica [Pregabalin] Other (See Comments)    "Just made me feel bad"   Ciprofloxacin Itching and Rash     Home Medications  Prior to Admission medications   Medication Sig Start Date End Date Taking? Authorizing Provider  amLODipine (NORVASC) 10 MG tablet TAKE 1 TABLET BY MOUTH ONCE DAILY Patient not taking: Reported on 05/29/2023 03/01/23   Corwin Levins, MD  aspirin EC 81 MG EC tablet Take 1 tablet (81 mg total) by mouth daily. Swallow  whole. 04/20/21   Almon Hercules, MD  atorvastatin (LIPITOR) 40 MG tablet Take 1 tablet by mouth once daily 04/23/23   Corwin Levins, MD  b complex vitamins capsule Take 1 capsule by mouth daily.    [provider]  benazepril (LOTENSIN) 40 MG tablet Take 1 tablet by mouth once daily 10/23/22   Corwin Levins, MD  carvedilol (COREG) 12.5 MG tablet  TAKE 1 TABLET BY MOUTH TWICE A DAY 04/27/23   Lewayne Bunting, MD  cyanocobalamin (VITAMIN B12) 500 MCG tablet Take 500 mcg by mouth daily.    [provider]  dexamethasone (DECADRON) 4 MG tablet Take 1 tablet (4 mg total) by mouth daily. Starting the day after chemo, take for 3 to 5 days 04/05/23   Carlean Jews, NP  diphenoxylate-atropine (LOMOTIL) 2.5-0.025 MG tablet Take 1-2 tablets by mouth 4 (four) times daily as needed for diarrhea or loose stools. 03/01/23   Malachy Mood, MD  DULoxetine (CYMBALTA) 30 MG capsule Take 1 capsule (30 mg total) by mouth daily. 05/17/23   Malachy Mood, MD  iron polysaccharides (NIFEREX) 150 MG capsule Take 1 capsule (150 mg total) by mouth 2 (two) times daily. 03/22/23   Pollyann Samples, NP  potassium chloride (KLOR-CON 10) 10 MEQ tablet 2 tab by mouth three times per day 05/01/23   Corwin Levins, MD  potassium chloride SA (KLOR-CON M20) 20 MEQ tablet Take 1 tablet (20 mEq total) by mouth 3 (three) times daily. 10/16/22   Corwin Levins, MD  prochlorperazine (COMPAZINE) 10 MG tablet Take 1 tablet (10 mg total) by mouth every 6 (six) hours as needed for nausea or vomiting (Use for nausea and / or vomiting unresolved with ondansetron (Zofran).). 10/24/21   Almond Lint, MD  traMADol (ULTRAM) 50 MG tablet TAKE 1 TABLET BY MOUTH EVERY 12 HOURS AS NEEDED 02/26/23   Corwin Levins, MD  Travoprost, BAK Free, (TRAVATAN) 0.004 % SOLN ophthalmic solution Place 1 drop into both eyes at bedtime.    [provider]  VITAMIN D PO Take 2 tablets by mouth daily. Gummies    [provider]     Critical care time: n/a        Posey Boyer, MSN, AG-ACNP-BC [facility] 05/31/2023, 3:32 PM  See Amion for pager If no response to pager , please call [phone] until 7pm After 7:00 pm call Elink  [phone]

## 2023-05-31 NOTE — Progress Notes (Signed)
   05/31/23 1542  TOC Brief Assessment  Insurance and Status Reviewed  Patient has primary care physician Yes  Home environment has been reviewed Home with spouse/ family  Prior level of function: Independent  Prior/Current Home Services No current home services  Social Drivers of Health Review SDOH reviewed no interventions necessary  Readmission risk has been reviewed Yes  Transition of care needs no transition of care needs at this time

## 2023-05-31 NOTE — Progress Notes (Signed)
 Pharmacy Antibiotic Note  Aaron Moss is a 78 y.o. male admitted on 05/31/2023 with weakness, anorexia, dyspnea and hypoxia with exertion.  Pharmacy has been consulted for Cefepime and vancomycin dosing for pneumonia.   Plan: Cefepime 2g IV q8h Vancomycin 1750 mg IV x1, then 1000mg  IV q24h (SCr 1, IBW, Vd 0.5, est AUC 446) Measure Vanc levels as needed.  Goal AUC = 400 - 550 Follow up renal function, culture results, and clinical course.      Temp (24hrs), Avg:98 F (36.7 C), Min:97.9 F (36.6 C), Max:98 F (36.7 C)  Recent Labs  Lab 05/29/23 1438 05/31/23 0812  WBC 16.0* 21.2*  CREATININE 0.96 1.00    Estimated Creatinine Clearance: 63.2 mL/min (by C-G formula based on SCr of 1 mg/dL).    Allergies  Allergen Reactions   Lyrica [Pregabalin] Other (See Comments)    "Just made me feel bad"   Ciprofloxacin Itching and Rash    Antimicrobials this admission: 3/13 Cefepime >> 3/13 Vancomycin >>   Dose adjustments this admission:   Microbiology results: 3/13 Resp: SARS Coronavirus 2 positive 3/13 Sputum:   3/13 MRSA PCR:   Thank you for allowing pharmacy to be a part of this patient's care.  Lynann Beaver PharmD, BCPS [facility] main pharmacy [phone] 05/31/2023 5:37 PM

## 2023-05-31 NOTE — Patient Instructions (Signed)
 Iron Sucrose Injection What is this medication? IRON SUCROSE (EYE ern SOO krose) treats low levels of iron (iron deficiency anemia) in people with kidney disease. Iron is a mineral that plays an important role in making red blood cells, which carry oxygen from your lungs to the rest of your body. This medicine may be used for other purposes; ask your health care provider or pharmacist if you have questions. COMMON BRAND NAME(S): Venofer What should I tell my care team before I take this medication? They need to know if you have any of these conditions: Anemia not caused by low iron levels Heart disease High levels of iron in the blood Kidney disease Liver disease An unusual or allergic reaction to iron, other medications, foods, dyes, or preservatives Pregnant or trying to get pregnant Breastfeeding How should I use this medication? This medication is infused into a vein. It is given by your care team in a hospital or clinic setting. Talk to your care team about the use of this medication in children. While it may be prescribed for children as young as 2 years for selected conditions, precautions do apply. Overdosage: If you think you have taken too much of this medicine contact a poison control center or emergency room at once. NOTE: This medicine is only for you. Do not share this medicine with others. What if I miss a dose? Keep appointments for follow-up doses. It is important not to miss your dose. Call your care team if you are unable to keep an appointment. What may interact with this medication? Do not take this medication with any of the following: Deferoxamine Dimercaprol Other iron products This medication may also interact with the following: Chloramphenicol Deferasirox This list may not describe all possible interactions. Give your health care provider a list of all the medicines, herbs, non-prescription drugs, or dietary supplements you use. Also tell them if you smoke,  drink alcohol, or use illegal drugs. Some items may interact with your medicine. What should I watch for while using this medication? Visit your care team for regular checks on your progress. Tell your care team if your symptoms do not start to get better or if they get worse. You may need blood work done while you are taking this medication. You may need to eat more foods that contain iron. Talk to your care team. Foods that contain iron include whole grains or cereals, dried fruits, beans, peas, leafy green vegetables, and organ meats (liver, kidney). What side effects may I notice from receiving this medication? Side effects that you should report to your care team as soon as possible: Allergic reactions--skin rash, itching, hives, swelling of the face, lips, tongue, or throat Low blood pressure--dizziness, feeling faint or lightheaded, blurry vision Shortness of breath Side effects that usually do not require medical attention (report to your care team if they continue or are bothersome): Flushing Headache Joint pain Muscle pain Nausea Pain, redness, or irritation at injection site This list may not describe all possible side effects. Call your doctor for medical advice about side effects. You may report side effects to FDA at 1-800-FDA-1088. Where should I keep my medication? This medication is given in a hospital or clinic. It will not be stored at home. NOTE: This sheet is a summary. It may not cover all possible information. If you have questions about this medicine, talk to your doctor, pharmacist, or health care provider. Dehydration, Adult Dehydration is a condition in which there is not enough water or  other fluids in the body. This happens when a person loses more fluids than they take in. Important organs cannot work right without the right amount of fluids. Any loss of fluids from the body can cause dehydration. Dehydration can be mild, worse, or very bad. It should be treated  right away to keep it from getting very bad. What are the causes? Conditions that cause loss of water in the body. They include: Watery poop (diarrhea). Vomiting. Sweating a lot. Fever. Infection. Peeing (urinating) a lot. Not drinking enough fluids. Certain medicines, such as medicines that take extra fluid out of the body (diuretics). Lack of safe drinking water. Not being able to get enough water and food. What increases the risk? Having a long-term (chronic) illness that has not been treated the right way, such as: Diabetes. Heart disease. Kidney disease. Being 31 years of age or older. Having a disability. Living in a place that is high above the ground or sea (high in altitude). The thinner, drier air causes more fluid loss. Doing exercises that put stress on your body for a long time. Being active when in hot places. What are the signs or symptoms? Symptoms of dehydration depend on how bad it is. Mild or worse dehydration Thirst. Dry lips or dry mouth. Feeling dizzy or light-headed. Muscle cramps. Passing little pee or dark pee. Pee may be the color of tea. Headache. Very bad dehydration Changes in skin. Skin may: Be cold to the touch (clammy). Be blotchy or pale. Not go back to normal right after you pinch it and let it go. Little or no tears, pee, or sweat. Fast breathing. Low blood pressure. Weak pulse. Pulse that is more than 100 beats a minute when you are sitting still. Other changes, such as: Feeling very thirsty. Eyes that look hollow (sunken). Cold hands and feet. Being confused. Being very tired (lethargic) or having trouble waking from sleep. Losing weight. Loss of consciousness. How is this treated? Treatment for this condition depends on how bad your dehydration is. Treatment should start right away. Do not wait until your condition gets very bad. Very bad dehydration is an emergency. You will need to go to a hospital. Mild or worse dehydration  can be treated at home. You may be asked to: Drink more fluids. Drink an oral rehydration solution (ORS). This drink gives you the right amount of fluids, salts, and minerals (electrolytes). Very bad dehydration can be treated: With fluids through an IV tube. By correcting low levels of electrolytes in the body. By treating the problem that caused your dehydration. Follow these instructions at home: Oral rehydration solution If told by your doctor, drink an ORS: Make an ORS. Use instructions on the package. Start by drinking small amounts, about  cup (120 mL) every 5-10 minutes. Slowly drink more until you have had the amount that your doctor said to have.  Eating and drinking  Drink enough clear fluid to keep your pee pale yellow. If you were told to drink an ORS, finish the ORS first. Then, start slowly drinking other clear fluids. Drink fluids such as: Water. Do not drink only water. Doing that can make the salt (sodium) level in your body get too low. Water from ice chips you suck on. Fruit juice that you have added water to (diluted). Low-calorie sports drinks. Eat foods that have the right amounts of salts and minerals, such as bananas, oranges, potatoes, tomatoes, or spinach. Do not drink alcohol. Avoid drinks that have caffeine or  sugar. These include:: High-calorie sports drinks. Fruit juice that you did not add water to. Soda. Coffee or energy drinks. Avoid foods that are greasy or have a lot of fat or sugar. General instructions Take over-the-counter and prescription medicines only as told by your doctor. Do not take sodium tablets. Doing that can make the salt level in your body get too high. Return to your normal activities as told by your doctor. Ask your doctor what activities are safe for you. Keep all follow-up visits. Your doctor may check and change your treatment. Contact a doctor if: You have pain in your belly (abdomen) and the pain: Gets worse. Stays in  one place. You have a rash. You have a stiff neck. You get angry or annoyed more easily than normal. You are more tired or have a harder time waking than normal. You feel weak or dizzy. You feel very thirsty. Get help right away if: You have any symptoms of very bad dehydration. You vomit every time you eat or drink. Your vomiting gets worse, does not go away, or you vomit blood or green stuff. You are getting treatment, but symptoms are getting worse. You have a fever. You have a very bad headache. You have: Diarrhea that gets worse or does not go away. Blood in your poop (stool). This may cause poop to look black and tarry. No pee in 6-8 hours. Only a small amount of pee in 6-8 hours, and the pee is very dark. You have trouble breathing. These symptoms may be an emergency. Get help right away. Call 911. Do not wait to see if the symptoms will go away. Do not drive yourself to the hospital. This information is not intended to replace advice given to you by your health care provider. Make sure you discuss any questions you have with your health care provider. Document Revised: 10/03/2021 Document Reviewed: 10/03/2021 Elsevier Patient Education  2024 [employer].   2024 Elsevier/Gold Standard (2022-10-25 00:00:00)

## 2023-05-31 NOTE — H&P (Signed)
 History and Physical  Aaron Moss BJY:[id number] DOB: 1945-07-03 DOA: 05/31/2023  PCP: Corwin Levins, MD   Chief Complaint: Fatigue, dyspnea  HPI: Aaron Moss is a 78 y.o. male with medical history significant for duodenal adenocarcinoma status post chemotherapy most recently due to some mild disease progression on FOLFIRI plan was to switch to immunotherapy due to poor tolerance of chemotherapy however has been off of therapy since 2/13 had recent outpatient CT chest abdomen pelvis which showed concern for new groundglass opacities and is now being admitted to the hospital with continued weakness, anorexia, dyspnea and hypoxia with exertion.  For the past month or so, he has had progressive worsening cancer related fatigue.  Has been nearly a month since he received his last immunotherapy but he is still quite fatigued.  History is provided by the patient as well as his wife who is at the bedside.  I also had a chance to speak over the phone this morning with his oncologist Dr. Parke Poisson.  He denies any fevers, chills, abdominal or chest pain.  He has had a intermittent cough productive of clear sputum, this has been going on for a couple of months and has not worsened recently.  No dysuria, hemoptysis, lower extremity pain or calf swelling or pain.  He was seen in the office today, noted to be desaturating to 87% on ambulation on room air, with recovery back to 95% on room air after few minutes of rest.  He was also noted to have some dizziness with position changes.  He has also had incredibly poor appetite, some mild nausea without vomiting.  Patient states he is just not been eating because he does not have an appetite.  He barely takes in about 1 Ensure per day.  Due to this history, as well as CT scan findings below, he was directly admitted to the hospitalist service for expeditious workup and management.  Review of Systems: Please see HPI for pertinent positives and negatives. A complete 10  system review of systems are otherwise negative.  Past Medical History:  Diagnosis Date   Anemia 10/31/2021   Dx IDA   Carotid stenosis, left    Colon cancer (HCC) 2000   Coronary artery disease    per Dr. 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, volume 11.5 ml   Stroke (HCC) 04/18/2021   TIA (transient ischemic attack)    Past Surgical History:  Procedure Laterality Date   CHOLECYSTECTOMY N/A 10/19/2021   Procedure: CHOLECYSTECTOMY;  Surgeon: Almond Lint, MD;  Location: [facility] OR;  Service: General;  Laterality: N/A;   COLON SURGERY  2000   colon cancer   COLONOSCOPY     ENDARTERECTOMY Left 04/25/2021   Procedure: LEFT ENDARTERECTOMY CAROTID;  Surgeon: Cephus Shelling, MD;  Location: [facility] OR;  Service: Vascular;  Laterality: Left;   EUS     pancreatic cyst   KNEE ARTHROSCOPY  2007   LT- [facility]   LAPAROSCOPY N/A 10/19/2021   Procedure: LAPAROSCOPY DIAGNOSTIC;  Surgeon: Almond Lint, MD;  Location: [facility] OR;  Service: General;  Laterality: N/A;  GENERAL AND TAP BLOCK   PATCH ANGIOPLASTY Left 04/25/2021   Procedure: PATCH ANGIOPLASTY XENOSURE 1CM X 6CM;  Surgeon: Cephus Shelling, MD;  Location[facility] OR;  Service: Vascular;  Laterality: Left;   PORTACATH PLACEMENT Left 11/17/2021   Procedure: PORT PLACEMENT;  Surgeon: Almond Lint, MD;  Location: [facility] OR;  Service: General;  Laterality: Left;   PROSTATE BIOPSY  05/23/13   gleason 7, volume 11.5 ml   WHIPPLE PROCEDURE N/A 10/19/2021   Procedure: ATTEMPTED WHIPPLE PROCEDURE  WITH GASTRODUODENAL BYPASS;  Surgeon: Almond Lint, MD;  Location: [facility] OR;  Service: General;   Laterality: N/A;  GENERAL AND TAP BLOCK   Social History:  reports that he quit smoking about 35 years ago. His smoking use included cigarettes. He started smoking about 57 years ago. He has a 22 pack-year smoking history. He has never used smokeless tobacco. He reports that he does not currently use alcohol. He reports that he does not use drugs.  Allergies  Allergen Reactions   Lyrica [Pregabalin] Other (See Comments)    "Just made me feel bad"   Ciprofloxacin Itching and Rash    Family History  Problem Relation Age of Onset   Cancer Mother        Liver? bladder?, dx. late 47s   Colon cancer Sister 64   Cancer Brother        Lung   Diabetes Brother    Cancer Maternal Aunt        unknown type   Breast cancer Maternal Grandmother        dx. >50   Cancer Daughter 64       colon   Esophageal cancer Neg Hx    Rectal cancer Neg Hx    Stomach cancer Neg Hx      Prior to Admission medications   Medication Sig Start Date End Date Taking? Authorizing Provider  amLODipine (NORVASC) 10 MG tablet TAKE 1 TABLET BY MOUTH ONCE DAILY Patient not taking: Reported on 05/29/2023 03/01/23   Corwin Levins, MD  aspirin EC 81 MG EC tablet Take 1 tablet (81 mg total) by mouth daily. Swallow whole. 04/20/21   Almon Hercules, MD  atorvastatin (LIPITOR) 40 MG tablet Take 1 tablet by mouth once daily 04/23/23   Corwin Levins, MD  b complex vitamins capsule Take 1 capsule by mouth daily.    [provider]  benazepril (LOTENSIN) 40 MG tablet Take 1 tablet by mouth once daily 10/23/22   Corwin Levins, MD  carvedilol (COREG) 12.5 MG tablet TAKE 1 TABLET BY MOUTH TWICE A DAY 04/27/23   Lewayne Bunting, MD  cyanocobalamin (VITAMIN B12) 500 MCG tablet Take 500 mcg by mouth daily.    [provider]  dexamethasone (DECADRON) 4 MG tablet Take 1 tablet (4 mg total) by mouth daily. Starting the day after chemo, take for 3 to 5 days 04/05/23   Carlean Jews, NP  diphenoxylate-atropine (LOMOTIL)  2.5-0.025 MG tablet Take 1-2 tablets by mouth 4 (four) times daily as needed for diarrhea or loose stools. 03/01/23   Malachy Mood, MD  DULoxetine (CYMBALTA) 30 MG capsule Take 1 capsule (30 mg total) by mouth daily. 05/17/23   Malachy Mood, MD  iron polysaccharides (NIFEREX) 150 MG capsule Take 1 capsule (150 mg total) by mouth 2 (two) times daily. 03/22/23   Pollyann Samples, NP  potassium chloride (KLOR-CON 10) 10 MEQ tablet 2 tab by mouth three  times per day 05/01/23   Corwin Levins, MD  potassium chloride SA (KLOR-CON M20) 20 MEQ tablet Take 1 tablet (20 mEq total) by mouth 3 (three) times daily. 10/16/22   Corwin Levins, MD  prochlorperazine (COMPAZINE) 10 MG tablet Take 1 tablet (10 mg total) by mouth every 6 (six) hours as needed for nausea or vomiting (Use for nausea and / or vomiting unresolved with ondansetron (Zofran).). 10/24/21   Almond Lint, MD  traMADol (ULTRAM) 50 MG tablet TAKE 1 TABLET BY MOUTH EVERY 12 HOURS AS NEEDED 02/26/23   Corwin Levins, MD  Travoprost, BAK Free, (TRAVATAN) 0.004 % SOLN ophthalmic solution Place 1 drop into both eyes at bedtime.    [provider]  VITAMIN D PO Take 2 tablets by mouth daily. Gummies    [provider]    Physical Exam: Clinic vitals: Afebrile temperature 98 F, pulse 102, blood pressure 160/76, saturating 100% on room air at rest.  Documented to be saturating 88% after ambulation. General:  Alert, oriented, calm, in no acute distress, looks chronically ill/tired.  His wife is at the bedside. Eyes: EOMI, clear conjuctivae, white sclerea Neck: supple, no masses, trachea mildline  Cardiovascular: RRR, no murmurs or rubs, no peripheral edema  Respiratory: Breath sounds globally diminished, no tachypnea, no wheezing, minimal crackles on deep inspiration.  No tachypnea or respiratory distress.  Resting comfortably on room air. Abdomen: soft, nontender, nondistended, normal bowel tones heard  Skin: dry, no rashes  Musculoskeletal: no joint  effusions, normal range of motion  Psychiatric: appropriate affect, normal speech  Neurologic: extraocular muscles intact, clear speech, moving all extremities with intact sensorium         Labs on Admission:  Basic Metabolic Panel: Recent Labs  Lab 05/29/23 1438 05/31/23 0812  NA 139 137  K 4.3 4.3  CL 111 108  CO2 25 23  GLUCOSE 125* 94  BUN 13 15  CREATININE 0.96 1.00  CALCIUM 7.9* 8.5*   Liver Function Tests: Recent Labs  Lab 05/29/23 1438 05/31/23 0812  AST 34 36  ALT 31 30  ALKPHOS 72 76  BILITOT 0.9 1.2  PROT 6.0* 6.5  ALBUMIN 2.3* 1.9*   No results for input(s): "LIPASE", "AMYLASE" in the last 168 hours. No results for input(s): "AMMONIA" in the last 168 hours. CBC: Recent Labs  Lab 05/29/23 1438 05/31/23 0812  WBC 16.0* 21.2*  NEUTROABS 13.2* 18.1*  HGB 7.7* 8.3*  HCT 26.1* 28.1*  MCV 83.1 82.2  PLT 306 350   Cardiac Enzymes: No results for input(s): "CKTOTAL", "CKMB", "CKMBINDEX", "TROPONINI" in the last 168 hours. BNP (last 3 results) No results for input(s): "BNP" in the last 8760 hours.  ProBNP (last 3 results) No results for input(s): "PROBNP" in the last 8760 hours.  CBG: No results for input(s): "GLUCAP" in the last 168 hours.  Radiological Exams on Admission: CT CHEST ABDOMEN PELVIS W CONTRAST Result Date: 05/30/2023 CLINICAL DATA:  Colon cancer, adenocarcinoma of the duodenum, assess treatment response. * Tracking Code: BO * EXAM: CT CHEST, ABDOMEN, AND PELVIS WITH CONTRAST TECHNIQUE: Multidetector CT imaging of the chest, abdomen and pelvis was performed following the standard protocol during bolus administration of intravenous contrast. RADIATION DOSE REDUCTION: This exam was performed according to the departmental dose-optimization program which includes automated exposure control, adjustment of the mA and/or kV according to patient size and/or use of iterative reconstruction technique. CONTRAST:  30mL OMNIPAQUE IOHEXOL 300 MG/ML SOLN,  OMNIPAQUE IOHEXOL 300 MG/ML SOLN  COMPARISON:  04/04/2023, 12/04/2022 FINDINGS: CT CHEST FINDINGS Cardiovascular: Extensive multi-vessel coronary artery calcification. Global cardiac size within normal limits. No pericardial effusion. Left subclavian chest port catheter tip within the superior right atrium. Central pulmonary arteries are of normal caliber. Mild atherosclerotic calcification within the thoracic aorta. No aortic aneurysm. Mediastinum/Nodes: Visualized thyroid is unremarkable. Progressive shotty mediastinal adenopathy, most evident within the left pre-vascular lymph node group where index lymph nodes measure 9-10 mm in short axis diameter, progressively enlarged since both prior examinations. These are nonspecific, and may be reactive in nature given the additional findings described below. The esophagus is unremarkable. Lungs/Pleura: Extensive ground-glass and reticular infiltrates and scattered areas of consolidation have developed within the lungs bilaterally, asymmetrically demonstrating no zonal predilection. This is nonspecific and may relate to multifocal infection in the acute setting. Drug reaction in the setting of immunotherapy, however, could appear similarly. No pneumothorax or pleural effusion. No central obstructing lesion. Musculoskeletal: No acute bone abnormality. No lytic or blastic bone lesion. Intramuscular lipoma within the right infraspinatus again noted. CT ABDOMEN PELVIS FINDINGS Hepatobiliary: Stable tiny cyst within segment 4 B. liver otherwise unremarkable. Status post cholecystectomy. Stable mild proximal extrahepatic biliary ductal dilation secondary to mass effect upon the extrahepatic bile best seen on axial image # 54/4 by the primary malignancy. Pancreas: There is progressive infiltration of the proximal body and head of the pancreas by the duodenal malignancy. Obstruction the main pancreatic duct with pancreatic ductal dilation involving the distal body and tail  of pancreas again identified with progressive parenchymal atrophy. No superimposed peripancreatic inflammatory change. Spleen: Unremarkable Adrenals/Urinary Tract: Adrenal glands are unremarkable. Simple cortical cysts are seen within the kidneys bilaterally for which no follow-up imaging is recommended. The kidneys are otherwise unremarkable. Bladder unremarkable. Stomach/Bowel: Partial transverse colectomy with anastomotic staple line again identified within the distal transverse colon. Small epigastric ventral hernia containing a single loop of unremarkable small bowel. Mild pancolonic diverticulosis again noted. The infiltrative malignant mass involving the gastric antrum and proximal duodenum in keeping with the patient's known primary duodenal adenocarcinoma is difficult to discretely measure given its infiltrative nature, however, appears grossly stable when measured in similar conglomerate fashion measuring 6.3 x 10.1 cm at axial image # 56/2. As noted above, there is increasing infiltration of the mass into the porta hepatis and head of the pancreas. Shotty peripancreatic lymph nodes are again identified suspicious for nodal metastases measuring 10 mm in short axis diameter, unchanged. The duodenal lumen appears nearly obliterated, however, there is no significant gastric distension to suggest gastric outlet obstruction at this time. There is increasing infiltration of the mass into the transverse mesocolon, best appreciated on image # 61/2. Stomach, small bowel, and large bowel are otherwise unremarkable. Appendix normal. No free intraperitoneal gas or fluid. Vascular/Lymphatic: Aortic atherosclerosis. No additional pathologic adenopathy within the abdomen and pelvis. Reproductive: Fiducial markers are seen within the prostate gland. The prostate gland appears relatively atrophic. Other: None significant Musculoskeletal: Degenerative changes seen within the lumbar spine. No acute bone abnormality. No lytic  or blastic bone lesion. IMPRESSION: 1. Grossly technically stable infiltrative malignant mass involving the gastric antrum and proximal duodenum in keeping with the patient's known primary duodenal adenocarcinoma. Subtle increasing infiltration of the mass into the porta hepatis, head of the pancreas, and transverse mesocolon. No significant gastric distension to suggest gastric outlet obstruction at this time. 2. Stable shotty peripancreatic lymph nodes suspicious for nodal metastases. 3. Stable biliary and pancreatic ductal obstruction with mild proximal intrahepatic biliary ductal  dilation but no intrahepatic biliary dilation at this time. Progressive atrophy of the mid body and tail the pancreas with stable marked ductal dilation in keeping with occlusion of the main pancreatic duct by the malignant mass. 4. Extensive multi-vessel coronary artery calcification. 5. Extensive ground-glass and reticular infiltrates and scattered areas of consolidation have developed within the lungs bilaterally, asymmetrically demonstrating no zonal predilection. This is nonspecific and may relate to multifocal infection in the acute setting. Drug reaction in the setting of immunotherapy, however, could appear similarly. Associated shotty mediastinal adenopathy is likely reactive in nature. Aortic Atherosclerosis (ICD10-I70.0). Electronically Signed   By: Helyn Numbers M.D.   On: 05/30/2023 21:09   Assessment/Plan Aaron Moss is a 78 y.o. male with medical history significant for duodenal adenocarcinoma status post chemotherapy most recently due to some mild disease progression on FOLFIRI plan was to switch to immunotherapy due to poor tolerance of chemotherapy however has been off of therapy since 2/13 had recent outpatient CT chest abdomen pelvis which showed concern for new groundglass opacities and is now being admitted to the hospital with continued weakness, anorexia, dyspnea and hypoxia with exertion.  Groundglass  opacities-with weakness, leukocytosis, dyspnea and hypoxia with exertion.  Unclear etiology, this could be an atypical infection in the setting of chemotherapy and active malignancy, or immunotherapy effect. -Observation admission -Monitor closely with supplemental oxygen -Pulmonology consulted, their input is appreciated; will hold off on steroids or antibiotics until their evaluation -Check viral panel for COVID, influenza, RSV  Leukocytosis-could be reactive versus due to acute infection.  Given lack of significant cough or sputum production, as well as no fevers I am less concerned about an acute bacterial infection.  Hypertension-benazepril, Coreg  Iron deficiency anemia-continue Niferex capsule  DVT prophylaxis: Lovenox     Code Status: Full Code  Consults called: Pulmonology  Admission status: Observation  Time spent: 55 minutes  Gerasimos Plotts Sharlette Dense MD [facility] Pager [phone]  If 7PM-7AM, please contact night-coverage [email or web address] Password [email or web address]  05/31/2023, 3:14 PM

## 2023-05-31 NOTE — Progress Notes (Signed)
 Per Mosetta Putt, MD, no blood transfusion today dt hgb 8.3 but proceed with 200mg  Venofer  Pt c/o SOB with exertion and only able to walk short distances. Pt 87-88% on ambulation but able to recover to 95% on RA after a few minutes of rest. Dizziness with position change. FTT, pt only able to drink 1 ensure/day. S/p fall on 05/30/23, no LOC/pt states he did not hit head. 1L NS over 2hrs given. Mosetta Putt, MD at bedside. Admission to [facility] 160[facility]. Pt/wife aware of plan and agreeable. Report called to [place], Charity fundraiser. Pt transported to 1604 via wheelchair by RN and NT.

## 2023-06-01 ENCOUNTER — Other Ambulatory Visit: Payer: Self-pay

## 2023-06-01 DIAGNOSIS — Z85068 Personal history of other malignant neoplasm of small intestine: Secondary | ICD-10-CM | POA: Diagnosis not present

## 2023-06-01 DIAGNOSIS — R627 Adult failure to thrive: Secondary | ICD-10-CM | POA: Diagnosis present

## 2023-06-01 DIAGNOSIS — Z881 Allergy status to other antibiotic agents status: Secondary | ICD-10-CM | POA: Diagnosis not present

## 2023-06-01 DIAGNOSIS — R918 Other nonspecific abnormal finding of lung field: Secondary | ICD-10-CM | POA: Diagnosis not present

## 2023-06-01 DIAGNOSIS — Z8673 Personal history of transient ischemic attack (TIA), and cerebral infarction without residual deficits: Secondary | ICD-10-CM | POA: Diagnosis not present

## 2023-06-01 DIAGNOSIS — J1282 Pneumonia due to coronavirus disease 2019: Secondary | ICD-10-CM

## 2023-06-01 DIAGNOSIS — R64 Cachexia: Secondary | ICD-10-CM | POA: Diagnosis present

## 2023-06-01 DIAGNOSIS — Z9221 Personal history of antineoplastic chemotherapy: Secondary | ICD-10-CM | POA: Diagnosis not present

## 2023-06-01 DIAGNOSIS — Z515 Encounter for palliative care: Secondary | ICD-10-CM | POA: Diagnosis not present

## 2023-06-01 DIAGNOSIS — I10 Essential (primary) hypertension: Secondary | ICD-10-CM | POA: Diagnosis present

## 2023-06-01 DIAGNOSIS — Z6832 Body mass index (BMI) 32.0-32.9, adult: Secondary | ICD-10-CM | POA: Diagnosis not present

## 2023-06-01 DIAGNOSIS — Z923 Personal history of irradiation: Secondary | ICD-10-CM | POA: Diagnosis not present

## 2023-06-01 DIAGNOSIS — D849 Immunodeficiency, unspecified: Secondary | ICD-10-CM | POA: Diagnosis present

## 2023-06-01 DIAGNOSIS — U071 COVID-19: Secondary | ICD-10-CM | POA: Diagnosis not present

## 2023-06-01 DIAGNOSIS — I2510 Atherosclerotic heart disease of native coronary artery without angina pectoris: Secondary | ICD-10-CM | POA: Diagnosis present

## 2023-06-01 DIAGNOSIS — Z66 Do not resuscitate: Secondary | ICD-10-CM | POA: Diagnosis present

## 2023-06-01 DIAGNOSIS — E785 Hyperlipidemia, unspecified: Secondary | ICD-10-CM | POA: Diagnosis present

## 2023-06-01 DIAGNOSIS — J440 Chronic obstructive pulmonary disease with acute lower respiratory infection: Secondary | ICD-10-CM | POA: Diagnosis present

## 2023-06-01 DIAGNOSIS — Z9049 Acquired absence of other specified parts of digestive tract: Secondary | ICD-10-CM | POA: Diagnosis not present

## 2023-06-01 DIAGNOSIS — D509 Iron deficiency anemia, unspecified: Secondary | ICD-10-CM | POA: Diagnosis present

## 2023-06-01 DIAGNOSIS — E46 Unspecified protein-calorie malnutrition: Secondary | ICD-10-CM | POA: Diagnosis present

## 2023-06-01 DIAGNOSIS — Z888 Allergy status to other drugs, medicaments and biological substances status: Secondary | ICD-10-CM | POA: Diagnosis not present

## 2023-06-01 DIAGNOSIS — C170 Malignant neoplasm of duodenum: Secondary | ICD-10-CM | POA: Diagnosis present

## 2023-06-01 DIAGNOSIS — R0602 Shortness of breath: Secondary | ICD-10-CM | POA: Diagnosis present

## 2023-06-01 DIAGNOSIS — Z85038 Personal history of other malignant neoplasm of large intestine: Secondary | ICD-10-CM | POA: Diagnosis not present

## 2023-06-01 DIAGNOSIS — Z8546 Personal history of malignant neoplasm of prostate: Secondary | ICD-10-CM | POA: Diagnosis not present

## 2023-06-01 LAB — CBC
HCT: 26.2 % — ABNORMAL LOW (ref 39.0–52.0)
Hemoglobin: 7.5 g/dL — ABNORMAL LOW (ref 13.0–17.0)
MCH: 24.6 pg — ABNORMAL LOW (ref 26.0–34.0)
MCHC: 28.6 g/dL — ABNORMAL LOW (ref 30.0–36.0)
MCV: 85.9 fL (ref 80.0–100.0)
Platelets: 275 10*3/uL (ref 150–400)
RBC: 3.05 MIL/uL — ABNORMAL LOW (ref 4.22–5.81)
RDW: 23.4 % — ABNORMAL HIGH (ref 11.5–15.5)
WBC: 21.4 10*3/uL — ABNORMAL HIGH (ref 4.0–10.5)
nRBC: 0.2 % (ref 0.0–0.2)

## 2023-06-01 LAB — BASIC METABOLIC PANEL
Anion gap: 10 (ref 5–15)
BUN: 13 mg/dL (ref 8–23)
CO2: 19 mmol/L — ABNORMAL LOW (ref 22–32)
Calcium: 7.9 mg/dL — ABNORMAL LOW (ref 8.9–10.3)
Chloride: 109 mmol/L (ref 98–111)
Creatinine, Ser: 0.86 mg/dL (ref 0.61–1.24)
GFR, Estimated: >60 mL/min (ref >60)
Glucose, Bld: 102 mg/dL — ABNORMAL HIGH (ref 70–99)
Potassium: 3.6 mmol/L (ref 3.5–5.1)
Sodium: 138 mmol/L (ref 135–145)

## 2023-06-01 LAB — PROCALCITONIN: Procalcitonin: 0.14 ng/mL

## 2023-06-01 MED ORDER — NIRMATRELVIR/RITONAVIR (PAXLOVID)TABLET
3.0000 | ORAL_TABLET | Freq: Two times a day (BID) | ORAL | Status: DC
  Administered 2023-06-01 – 2023-06-03 (×4): 3 via ORAL
  Filled 2023-06-01: qty 30

## 2023-06-01 MED ORDER — SODIUM CHLORIDE 0.9% FLUSH
10.0000 mL | Freq: Two times a day (BID) | INTRAVENOUS | Status: DC
  Administered 2023-06-01 (×2): 10 mL
  Administered 2023-06-02: 20 mL
  Administered 2023-06-02: 10 mL
  Administered 2023-06-03: 20 mL

## 2023-06-01 MED ORDER — CHLORHEXIDINE GLUCONATE CLOTH 2 % EX PADS
6.0000 | MEDICATED_PAD | Freq: Every day | CUTANEOUS | Status: DC
  Administered 2023-06-01 – 2023-06-03 (×3): 6 via TOPICAL

## 2023-06-01 NOTE — Care Management Obs Status (Signed)
 MEDICARE OBSERVATION STATUS NOTIFICATION   Patient Details  Name: Aaron Moss MRN: [id number] Date of Birth: March 25, 1945   Medicare Observation Status Notification Given:  Yes    Adrian Prows, RN 06/01/2023, 9:57 AM

## 2023-06-01 NOTE — Evaluation (Signed)
 Occupational Therapy Evaluation Patient Details Name: Aaron Moss MRN: [id number] DOB: 26-Oct-1945 Today's Date: 06/01/2023   History of Present Illness   78 y/o male who arrived at [facility] on 05/31/2023 due to for a month or so, he has had progressive worsening cancer related fatigue.. CT chest abdomen pelvis which showed concern for new groundglass opacities and is now being admitted to the hospital with continued weakness, anorexia, dyspnea and hypoxia with exertion. Pt with history of duodenal cancer with diagnostic laparoscopy, exploratory laparotomy, portal exploration, cholecystectomy, loop gastrojejunostomy completed in August 2023 at [facility] PMH: GERD, HLD, HTN, Cx history, anemia.     Clinical Impressions Patient is currently requiring as high as moderate assistance with basic ADLs, as well as Contact guard assist with functional transfers to recliner to simulate BSC with use of RW.   Current level of function is below patient's typical baseline.    During this evaluation, patient was limited by impaired activity tolerance with rapid fatigue onset with light activity which has the potential to impact patient's and/or caregivers' safety and independence during functional mobility, as well as performance for ADLs.    Patient lives with his family who  are able to provide 24/7 supervision and assistance.  Patient demonstrates good rehab potential, and should benefit from continued skilled occupational therapy services while in acute care to maximize safety, independence and quality of life at home.  No post-acute OT indicated at this time.  ?      If plan is discharge home, recommend the following:   A little help with walking and/or transfers;A little help with bathing/dressing/bathroom;Assistance with cooking/housework;Assist for transportation;Help with stairs or ramp for entrance     Functional Status Assessment   Patient has had a recent decline in their functional status and  demonstrates the ability to make significant improvements in function in a reasonable and predictable amount of time.     Equipment Recommendations   Tub/shower seat     Recommendations for Other Services         Precautions/Restrictions   Precautions Precautions: Fall Precaution/Restrictions Comments: Airborn/Contact Restrictions Weight Bearing Restrictions Per Provider Order: No     Mobility Bed Mobility Overal bed mobility: Needs Assistance Bed Mobility: Supine to Sit     Supine to sit: Modified independent (Device/Increase time), HOB elevated, Used rails     General bed mobility comments: Increased time/effort.    Transfers                          Balance Overall balance assessment: Mild deficits observed, not formally tested                                         ADL either performed or assessed with clinical judgement   ADL Overall ADL's : Needs assistance/impaired Eating/Feeding: Set up;Sitting   Grooming: Wash/dry face;Sitting;Set up Grooming Details (indicate cue type and reason): Pt agreed to washing face but too fatigued for oral care after t/f to recliner. Pt provided with full setup of oral  care at tray table and encouraged to perform after a rest. Upper Body Bathing: Set up Upper Body Bathing Details (indicate cue type and reason): Likely Min As due to fatigue. Lower Body Bathing: Minimal assistance;Moderate assistance;Sitting/lateral leans;Sit to/from stand;Cueing for compensatory techniques Lower Body Bathing Details (indicate cue type and reason): due to fatigue Upper Body  Dressing : Minimal assistance;Sitting   Lower Body Dressing: Minimal assistance;Moderate assistance;Sit to/from stand;Sitting/lateral leans Lower Body Dressing Details (indicate cue type and reason): due to fatigue Toilet Transfer: Contact guard assist;Supervision/safety;Rolling walker (2 wheels);Cueing for safety Toilet Transfer Details  (indicate cue type and reason): Pt stood from EOB to RW with SBA. Pt pivoted to recliner with CGA. Cues for hands with stand to sit for safety and pt followed well. CGA to control descent. Toileting- Clothing Manipulation and Hygiene: Minimal assistance;Moderate assistance Toileting - Clothing Manipulation Details (indicate cue type and reason): due to fatigue     Functional mobility during ADLs: Supervision/safety;Contact guard assist;Rolling walker (2 wheels);Cueing for safety       Vision Baseline Vision/History:  (reading glasses) Ability to See in Adequate Light: 0 Adequate Patient Visual Report: No change from baseline Vision Assessment?: No apparent visual deficits     Perception         Praxis         Pertinent Vitals/Pain Pain Assessment Pain Assessment: No/denies pain     Extremity/Trunk Assessment Upper Extremity Assessment Upper Extremity Assessment: Overall WFL for tasks assessed;Left hand dominant RUE Sensation: decreased light touch LUE Sensation: decreased light touch   Lower Extremity Assessment Lower Extremity Assessment: Defer to PT evaluation       Communication Communication Communication: No apparent difficulties   Cognition Arousal: Alert Behavior During Therapy: WFL for tasks assessed/performed, Flat affect               OT - Cognition Comments: Ox3. Struggles with situation.                 Following commands: Intact       Cueing  General Comments   Cueing Techniques: Verbal cues      Exercises     Shoulder Instructions      Home Living Family/patient expects to be discharged to:: Private residence Living Arrangements: Spouse/significant other;Other relatives (Granddaughter who works from home.) Available Help at Discharge: Available 24 hours/day;Family Type of Home: House Home Access: Stairs to enter Secretary/administrator of Steps: 2 Entrance Stairs-Rails: Can reach both Home Layout: One level     Bathroom  Shower/Tub: Producer, television/film/video: Handicapped height Bathroom Accessibility: Yes How Accessible: Accessible via walker Home Equipment: Rolling Walker (2 wheels);Grab bars - tub/shower;Lift chair   Additional Comments: HurryCane      Prior Functioning/Environment Prior Level of Function : History of Falls (last six months);Needs assist       Physical Assist : ADLs (physical)   ADLs (physical): IADLs Mobility Comments: Pt reports a fall "couple of days ago" while walking into the bathroom. No injuries. Pt does not use the RW much, amd uses Hurrycane PRN. ADLs Comments: Does not drive.. Family provides as well as family does all IADLs.  Pt denied assistance for BALDs.    OT Problem List: Decreased activity tolerance;Decreased knowledge of use of DME or AE;Increased edema;Impaired sensation   OT Treatment/Interventions: Self-care/ADL training;Therapeutic activities;Energy conservation;Patient/family education;Balance training;DME and/or AE instruction      OT Goals(Current goals can be found in the care plan section)   Acute Rehab OT Goals Patient Stated Goal: To walk out of here. OT Goal Formulation: With patient Time For Goal Achievement: 06/15/23 Potential to Achieve Goals: Good ADL Goals Pt Will Perform Grooming: standing;with modified independence (tolerating at least 1 full task with VSS) Additional ADL Goal #1: Patient will identify at least 3 energy conservation strategies to employ at home in  order to maximize function and quality of life and decrease caregiver burden while preventing exacerbation of symptoms and rehospitalization. Additional ADL Goal #2: Patient will identify at least 3 fall prevention strategies to employ at home in order to maximize function and safety during ADLs and decrease caregiver burden while preventing possible injury and rehospitalization. Additional ADL Goal #3: Pt will verbalize and demonstrate use of AE/DME with ADLs to support  energy conservation and increased independence at home. Additional ADL Goal #4: Pt will engage in 4 min x 2 sets of functional activities without loss of standing balance, in order to demonstrate improved activity tolerance and balance needed to perform ADLs safely at home.  VSS. RPE no greater than 5/10   OT Frequency:  Min 1X/week    Co-evaluation              AM-PAC OT "6 Clicks" Daily Activity     Outcome Measure Help from another person eating meals?: None Help from another person taking care of personal grooming?: A Little Help from another person toileting, which includes using toliet, bedpan, or urinal?: A Lot Help from another person bathing (including washing, rinsing, drying)?: A Lot Help from another person to put on and taking off regular upper body clothing?: A Little Help from another person to put on and taking off regular lower body clothing?: A Lot 6 Click Score: 16   End of Session Equipment Utilized During Treatment: Gait belt;Rolling walker (2 wheels) Nurse Communication: Other (comment) (IV beeping again)  Activity Tolerance: Patient limited by fatigue Patient left: in chair;with call bell/phone within reach;with chair alarm set  OT Visit Diagnosis: History of falling (Z91.81);Unsteadiness on feet (R26.81) (decreased ADLs)                Time: 1610-9604 OT Time Calculation (min): 26 min Charges:  OT General Charges $OT Visit: 1 Visit OT Evaluation $OT Eval Low Complexity: 1 Low OT Treatments $Therapeutic Activity: 8-22 mins  Victorino Dike, OT Acute Rehab Services Office: [phone] 06/01/2023   Theodoro Clock 06/01/2023, 9:25 AM

## 2023-06-01 NOTE — Evaluation (Signed)
 Physical Therapy Evaluation Patient Details Name: Aaron Moss MRN: [id number] DOB: 12/16/1945 Today's Date: 06/01/2023  History of Present Illness  78 y/o male who arrived at [facility] on 05/31/2023 due to for a month or so, he has had progressive worsening cancer related fatigue; admitted with covid. CT chest abdomen pelvis which showed concern for new groundglass opacities and is now being admitted to the hospital with continued weakness, anorexia, dyspnea and hypoxia with exertion. Pt with history of duodenal cancer with diagnostic laparoscopy, exploratory laparotomy, portal exploration, cholecystectomy, loop gastrojejunostomy completed in August 2023 at [facility]. PMH: GERD, HLD, HTN, Cx history, anemia.  Clinical Impression  Pt admitted with above diagnosis. Pt reports from home, ind at baseline with hurrycane in the home, limited time outside the home, ind with self care, spouse and granddaughter present in the home. On eval, pt modified ind with bed mobility, supine<>sit with increased time but no assistance or cues. CGA for transfers without AD, supv with in room amb, maneuvering IV pole, good steadiness, on RA with SpO2 100% and HR 86. Recommend HHPT with possible no f/u at d/c; good family support at home. Pt currently with functional limitations due to the deficits listed below (see PT Problem List). Pt will benefit from acute skilled PT to increase their independence and safety with mobility to allow discharge.           If plan is discharge home, recommend the following: A little help with walking and/or transfers;A little help with bathing/dressing/bathroom;Assistance with cooking/housework;Assist for transportation;Help with stairs or ramp for entrance   Can travel by private vehicle        Equipment Recommendations None recommended by PT  Recommendations for Other Services       Functional Status Assessment Patient has had a recent decline in their functional status and demonstrates the  ability to make significant improvements in function in a reasonable and predictable amount of time.     Precautions / Restrictions Precautions Precautions: Fall Precaution/Restrictions Comments: Airborn/Contact Restrictions Weight Bearing Restrictions Per Provider Order: No      Mobility  Bed Mobility Overal bed mobility: Modified Independent             General bed mobility comments: supine<>sit wthout assist, increased time and effort    Transfers Overall transfer level: Needs assistance Equipment used: None Transfers: Sit to/from Stand Sit to Stand: Contact guard assist           General transfer comment: CGA to power to stand    Ambulation/Gait Ambulation/Gait assistance: Supervision Gait Distance (Feet): 30 Feet Assistive device: IV Pole Gait Pattern/deviations: Step-through pattern, Decreased stride length Gait velocity: decreased     General Gait Details: step through gait pattern, good steadiness, completing turns around room, managing IV pole, no overt LOB or unsteadiness, no SOB noted but does report generalized fatigue  Stairs            Wheelchair Mobility     Tilt Bed    Modified Rankin (Stroke Patients Only)       Balance Overall balance assessment: No apparent balance deficits (not formally assessed)                                           Pertinent Vitals/Pain Pain Assessment Pain Assessment: No/denies pain    Home Living Family/patient expects to be discharged to:: Private residence Living Arrangements: Spouse/significant  other;Other relatives (Granddaughter who works from home.) Available Help at Discharge: Available 24 hours/day;Family Type of Home: House Home Access: Stairs to enter Entrance Stairs-Rails: Can reach both Entrance Stairs-Number of Steps: 2   Home Layout: One level Home Equipment: Agricultural consultant (2 wheels);Grab bars - tub/shower;Lift chair Additional Comments: HurryCane    Prior  Function Prior Level of Function : History of Falls (last six months);Needs assist       Physical Assist : ADLs (physical)   ADLs (physical): IADLs Mobility Comments: Pt reports a fall "couple of days ago" while walking into the bathroom. No injuries. Pt reports using Hurrycane in the home ADLs Comments: Does not drive.. Family provides as well as family does all IADLs.  Pt denied assistance for BALDs.     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)    Cervical / Trunk Assessment Cervical / Trunk Assessment: Normal  Communication   Communication Communication: No apparent difficulties    Cognition Arousal: Alert Behavior During Therapy: WFL for tasks assessed/performed, Flat affect   PT - Cognitive impairments: No apparent impairments                         Following commands: Intact       Cueing Cueing Techniques: Verbal cues     General Comments General comments (skin integrity, edema, etc.): on RA with SpO2 100% and HR 86    Exercises     Assessment/Plan    PT Assessment Patient needs continued PT services  PT Problem List Decreased activity tolerance;Decreased balance;Decreased mobility;Decreased knowledge of use of DME;Decreased safety awareness;Cardiopulmonary status limiting activity;Impaired sensation       PT Treatment Interventions DME instruction;Gait training;Stair training;Functional mobility training;Therapeutic activities;Therapeutic exercise;Balance training;Cognitive remediation;Patient/family education    PT Goals (Current goals can be found in the Care Plan section)  Acute Rehab PT Goals Patient Stated Goal: return home PT Goal Formulation: With patient Time For Goal Achievement: 06/15/23 Potential to  Achieve Goals: Good    Frequency Min 2X/week     Co-evaluation               AM-PAC PT "6 Clicks" Mobility  Outcome Measure Help needed turning from your back to your side while in a flat bed without using bedrails?: None Help needed moving from lying on your back to sitting on the side of a flat bed without using bedrails?: None Help needed moving to and from a bed to a chair (including a wheelchair)?: A Little Help needed standing up from a chair using your arms (e.g., wheelchair or bedside chair)?: A Little Help needed to walk in hospital room?: A Little Help needed climbing 3-5 steps with a railing? : A Little 6 Click Score: 20    End of Session Equipment Utilized During Treatment: Gait belt Activity Tolerance: Patient tolerated treatment well Patient left: in bed;with call bell/phone within reach Nurse Communication: Mobility status PT Visit Diagnosis: Other abnormalities of gait and mobility (R26.89)    Time: 5784-6962 PT Time Calculation (min) (ACUTE ONLY): 18 min   Charges:   PT Evaluation $PT Eval Low Complexity: 1 Low   PT General Charges $$ ACUTE PT VISIT: 1 Visit         Tori Forrest Jaroszewski PT, DPT 06/01/23, 3:55 PM

## 2023-06-01 NOTE — Progress Notes (Signed)
 NAME:  Aaron Moss, MRN:  [id number], DOB:  1945/11/12, LOS: 1 ADMISSION DATE:  05/31/2023, CONSULTATION DATE:  05/31/23 REFERRING MD:  TRH, CHIEF COMPLAINT:  abnormal chest CT   History of Present Illness:   18 yoM with PMH as below significant for former smoker, duodenal adenocarcinoma, HTN, HLD, anemia and GERD who was sent by oncology for admit given abnormal chest CT with progressive fatigue, postural dizziness with fall at home, ongoing poor appetite/poor PO intake and exertional SOB. Found in oncology clinic to have exertional sats 87-88% which recovered after rest.  Treated with IVF, venofer, and given reports of progressive symptoms and abdominal gas sent for repeat CT.  CT CAP w/contrast 3/12 showing new GGO and reticular infiltrates with scattered consolidation bilaterally compared to CT in January.  Due to changes, oncology recommended pulmonary consult for further input.   Pt followed by Dr. Mosetta Putt for adenocarcinoma of duodenum, initially diagnosed 02/2021.  Previously on FOLFOX, changed to Xeloda 04/2022 but given disease progression changed to FOLFIRI 08/2022 with good response, changed to irrinotecan 10/24 but due to disease progression again, changed back to FOLFIRI 04/05/2023, but off therapy since 2/13 due to poor tolerance.  CT CAP 03/2023 with stable right hilar lymph nodes and small mediastinal nodes, noted then to have significant increase in soft tissue mass involving the distal stomach, proximal duodenum, and increasing size of cystic pancreatic lesion.  In the last 2-3 weeks, reports more exertional SOB with ongoing severe fatigue and no appetite.  No fever, chills, change in cough or sputum, vomiting, or recent travel.  Former smoker, quit 30-35 yrs ago, 1ppd when he did smoke.  Worked in Facilities manager. No pets.  Denies prior lung history/ issues.  Noted 10 lb wt loss since January.  Pertinent  Medical History   Past Medical History:  Diagnosis Date   Anemia  10/31/2021   Dx IDA   Carotid stenosis, left    Colon cancer (HCC) 2000   Coronary artery disease    per Dr. 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 rate 18, height 5\' 6"  (1.676 m), weight 90 kg, SpO2 96%.        Intake/Output Summary (Last 24 hours) at 06/01/2023 1422 Last data filed at 06/01/2023 0700 Gross per 24 hour  Intake 450 ml  Output 1000 ml  Net -550 ml   Filed Weights   06/01/23 0942  Weight: 90 kg    Examination: General:  chronically ill appearing older male lying in bed in NAD HEENT: MM pink/moist, no JVD Neuro: Awake, interactive, nonfocal CV: rr, no murmur, port in left chest accessed PULM: Clear  to auscultation, no accessory muscle use GI: soft, bs+, ND Extremities: warm/dry, no tibial edema but +1 ankle edema Skin: no rashes   Labs> mild hypokalemia, stable leukocytosis, stable anemia  Resolved Hospital Problem list    Assessment & Plan:   Bilateral ground- glass and reticular infiltrates with scattered consolidation Leukocytoses, worsening Covid ++ pneumonia - new changes from CT CAP 03/2023, no specific pattern  -Continue empiric antibiotics -Steroids remdesivir per  protocol  - PT/ OT, maximize nutrition as able    Duodenal adenocarcinoma- oncology following HTN IDA- s/p venofer 3/12 Protein calorie Malnutrition Deconditioning  PCCM will be available as needed  Best Practice (right click and "Reselect all SmartList Selections" daily)  Per primary.    Labs   CBC: Recent Labs  Lab 05/29/23 1438 05/31/23 0812 06/01/23 0303  WBC 16.0* 21.2* 21.4*  NEUTROABS 13.2* 18.1*  --   HGB 7.7* 8.3* 7.5*  HCT 26.1* 28.1* 26.2*  MCV 83.1 82.2 85.9  PLT 306 350 275    Basic Metabolic Panel: Recent Labs  Lab 05/29/23 1438 05/31/23 0812 06/01/23 0303  NA 139 137 138  K 4.3 4.3 3.6  CL 111 108 109  CO2 25 23 19*  GLUCOSE 125* 94 102*  BUN 13 15 13   CREATININE 0.96 1.00 0.86  CALCIUM 7.9* 8.5* 7.9*   GFR: Estimated Creatinine Clearance: 73.2 mL/min (by C-G formula based on SCr of 0.86 mg/dL). Recent Labs  Lab 05/29/23 1438 05/31/23 0812 06/01/23 0303  PROCALCITON  --   --  0.14  WBC 16.0* 21.2* 21.4*    Liver Function Tests: Recent Labs  Lab 05/29/23 1438 05/31/23 0812  AST 34 36  ALT 31 30  ALKPHOS 72 76  BILITOT 0.9 1.2  PROT 6.0* 6.5  ALBUMIN 2.3* 1.9*   No results for input(s): "LIPASE", "AMYLASE" in the last 168 hours. No results for input(s): "AMMONIA" in the last 168 hours.  ABG    Component Value Date/Time   PHART 7.365 10/19/2021 1034   PCO2ART 47.5 10/19/2021 1034   PO2ART 188 (H) 10/19/2021 1034   HCO3 27.1 10/19/2021 1034   TCO2 29 10/19/2021 1034   O2SAT 100 10/19/2021 1034     Coagulation Profile: No results for input(s): "INR", "PROTIME" in the last 168 hours.  Cardiac Enzymes: No results for input(s): "CKTOTAL", "CKMB", "CKMBINDEX", "TROPONINI" in the last 168 hours.  HbA1C: Hgb A1c MFr Bld  Date/Time Value Ref Range Status  05/22/2022 09:37 AM 5.7 4.6 - 6.5 % Final    Comment:    Glycemic Control Guidelines for People with Diabetes:Non Diabetic:  <6%Goal of Therapy: <7%Additional Action  Suggested:  >8%   10/12/2021 10:37 AM 5.6 4.8 - 5.6 % Final    Comment:    (NOTE) Pre diabetes:          5.7%-6.4%  Diabetes:              >6.4%  Glycemic control for   <7.0% adults with diabetes     CBG: No results for input(s): "GLUCAP" in the last 168 hours.     Cyril Mourning MD. Tonny Bollman. [facility] Pager : [phone]  If no response to pager , please call [phone] until 7 pm After 7:00 pm call Elink  [phone]    06/01/2023, 2:22 PM

## 2023-06-01 NOTE — Progress Notes (Signed)
 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  CT showed new groundglass opacities and reticular infiltrates with scattered consolidation.  Patient was noted to be COVID positive.   Subjective:  Patient states he is very weak and tired.  Feels he is breathing reasonably well.   Objective: Vitals:   06/01/23 0527 06/01/23 0942 06/01/23 1100 06/01/23 1430  BP: (!) 164/72  (!) 160/69 (!) 165/70  Pulse: 87  87 85  Resp: 18   16  Temp: 98.2 F (36.8 C)   97.7 F (36.5 C)  TempSrc: Oral   Oral  SpO2: 96%   99%  Weight:  90 kg    Height: 5\' 6"  (1.676 m)       Intake/Output Summary (Last 24 hours) at 06/01/2023 1614 Last data filed at 06/01/2023 1500 Gross per 24 hour  Intake 550 ml  Output 1000 ml  Net -450 ml   Filed Weights   06/01/23 0942  Weight: 90 kg     Exam:  General: Listless man lying in bed in NAD no increased work of breathing Eyes: sclera anicteric, conjuctiva mild injection bilaterally CVS: S1-S2, regular  Respiratory:   Crackles in both mid lung field GI: NABS, soft, NT  LE: Warm and well-perfused Neuro: A/O x 3,  grossly nonfocal.  Psych: patient is logical and coherent, judgement and insight appear normal, mood and affect appropriate to situation.  Data Reviewed:  Basic Metabolic Panel: Recent Labs  Lab 05/29/23 1438 05/31/23 0812 06/01/23 0303  NA 139 137 138  K 4.3 4.3 3.6  CL 111 108 109  CO2 25 23 19*  GLUCOSE 125* 94 102*  BUN 13 15 13   CREATININE 0.96 1.00 0.86  CALCIUM 7.9* 8.5* 7.9*    CBC: Recent Labs  Lab 05/29/23 1438 05/31/23 0812 06/01/23 0303  WBC 16.0* 21.2* 21.4*  NEUTROABS 13.2* 18.1*  --   HGB 7.7* 8.3* 7.5*  HCT 26.1* 28.1* 26.2*  MCV 83.1 82.2 85.9   PLT 306 350 275     Scheduled Meds:  benazepril  40 mg Oral Daily   carvedilol  12.5 mg Oral BID   Chlorhexidine Gluconate Cloth  6 each Topical Daily   DULoxetine  30 mg Oral Daily   enoxaparin (LOVENOX) injection  40 mg Subcutaneous Q24H   feeding supplement  237 mL Oral TID BM   iron polysaccharides  150 mg Oral BID   methylPREDNISolone (SOLU-MEDROL) injection  40 mg Intravenous Daily   sodium chloride flush  10-40 mL Intracatheter Q12H   Continuous Infusions:  azithromycin 500 mg (05/31/23 2151)   ceFEPime (MAXIPIME) IV 2 g (06/01/23 1118)   vancomycin       Assessment & Plan:   Pneumonia secondary to COVID Groundglass opacities CAP, procalcitonin 0.14 COPD Appreciate PCCM consultation Continue treatment with cefepime, vancomycin and azithromycin Treatment of COVID pneumonia with steroids and Paxlovid Inhaled bronchodilators as needed for COPD treatment, patient already on steroids Follow fever curve and leukocytosis  HTN Continue carvedilol and benazepril  Duodenal adenocarcinoma Outpatient oncology    DVT prophylaxis: Lovenox Code Status: Full Family Communication: None today     Studies: CT CHEST ABDOMEN PELVIS W CONTRAST Result Date: 05/30/2023 CLINICAL DATA:  Colon cancer, adenocarcinoma of the duodenum, assess treatment response. *  Tracking Code: BO * EXAM: CT CHEST, ABDOMEN, AND PELVIS WITH CONTRAST TECHNIQUE: Multidetector CT imaging of the chest, abdomen and pelvis was performed following the standard protocol during bolus administration of intravenous contrast. RADIATION DOSE REDUCTION: This exam was performed according to the departmental dose-optimization program which includes automated exposure control, adjustment of the mA and/or kV according to patient size and/or use of iterative reconstruction technique. CONTRAST:  30mL OMNIPAQUE IOHEXOL 300 MG/ML SOLN, OMNIPAQUE IOHEXOL 300 MG/ML SOLN COMPARISON:  04/04/2023, 12/04/2022 FINDINGS: CT  CHEST FINDINGS Cardiovascular: Extensive multi-vessel coronary artery calcification. Global cardiac size within normal limits. No pericardial effusion. Left subclavian chest port catheter tip within the superior right atrium. Central pulmonary arteries are of normal caliber. Mild atherosclerotic calcification within the thoracic aorta. No aortic aneurysm. Mediastinum/Nodes: Visualized thyroid is unremarkable. Progressive shotty mediastinal adenopathy, most evident within the left pre-vascular lymph node group where index lymph nodes measure 9-10 mm in short axis diameter, progressively enlarged since both prior examinations. These are nonspecific, and may be reactive in nature given the additional findings described below. The esophagus is unremarkable. Lungs/Pleura: Extensive ground-glass and reticular infiltrates and scattered areas of consolidation have developed within the lungs bilaterally, asymmetrically demonstrating no zonal predilection. This is nonspecific and may relate to multifocal infection in the acute setting. Drug reaction in the setting of immunotherapy, however, could appear similarly. No pneumothorax or pleural effusion. No central obstructing lesion. Musculoskeletal: No acute bone abnormality. No lytic or blastic bone lesion. Intramuscular lipoma within the right infraspinatus again noted. CT ABDOMEN PELVIS FINDINGS Hepatobiliary: Stable tiny cyst within segment 4 B. liver otherwise unremarkable. Status post cholecystectomy. Stable mild proximal extrahepatic biliary ductal dilation secondary to mass effect upon the extrahepatic bile best seen on axial image # 54/4 by the primary malignancy. Pancreas: There is progressive infiltration of the proximal body and head of the pancreas by the duodenal malignancy. Obstruction the main pancreatic duct with pancreatic ductal dilation involving the distal body and tail of pancreas again identified with progressive parenchymal atrophy. No superimposed  peripancreatic inflammatory change. Spleen: Unremarkable Adrenals/Urinary Tract: Adrenal glands are unremarkable. Simple cortical cysts are seen within the kidneys bilaterally for which no follow-up imaging is recommended. The kidneys are otherwise unremarkable. Bladder unremarkable. Stomach/Bowel: Partial transverse colectomy with anastomotic staple line again identified within the distal transverse colon. Small epigastric ventral hernia containing a single loop of unremarkable small bowel. Mild pancolonic diverticulosis again noted. The infiltrative malignant mass involving the gastric antrum and proximal duodenum in keeping with the patient's known primary duodenal adenocarcinoma is difficult to discretely measure given its infiltrative nature, however, appears grossly stable when measured in similar conglomerate fashion measuring 6.3 x 10.1 cm at axial image # 56/2. As noted above, there is increasing infiltration of the mass into the porta hepatis and head of the pancreas. Shotty peripancreatic lymph nodes are again identified suspicious for nodal metastases measuring 10 mm in short axis diameter, unchanged. The duodenal lumen appears nearly obliterated, however, there is no significant gastric distension to suggest gastric outlet obstruction at this time. There is increasing infiltration of the mass into the transverse mesocolon, best appreciated on image # 61/2. Stomach, small bowel, and large bowel are otherwise unremarkable. Appendix normal. No free intraperitoneal gas or fluid. Vascular/Lymphatic: Aortic atherosclerosis. No additional pathologic adenopathy within the abdomen and pelvis. Reproductive: Fiducial markers are seen within the prostate gland. The prostate gland appears relatively atrophic. Other: None significant Musculoskeletal: Degenerative changes seen within the lumbar spine. No acute bone abnormality.  No lytic or blastic bone lesion. IMPRESSION: 1. Grossly technically stable infiltrative  malignant mass involving the gastric antrum and proximal duodenum in keeping with the patient's known primary duodenal adenocarcinoma. Subtle increasing infiltration of the mass into the porta hepatis, head of the pancreas, and transverse mesocolon. No significant gastric distension to suggest gastric outlet obstruction at this time. 2. Stable shotty peripancreatic lymph nodes suspicious for nodal metastases. 3. Stable biliary and pancreatic ductal obstruction with mild proximal intrahepatic biliary ductal dilation but no intrahepatic biliary dilation at this time. Progressive atrophy of the mid body and tail the pancreas with stable marked ductal dilation in keeping with occlusion of the main pancreatic duct by the malignant mass. 4. Extensive multi-vessel coronary artery calcification. 5. Extensive ground-glass and reticular infiltrates and scattered areas of consolidation have developed within the lungs bilaterally, asymmetrically demonstrating no zonal predilection. This is nonspecific and may relate to multifocal infection in the acute setting. Drug reaction in the setting of immunotherapy, however, could appear similarly. Associated shotty mediastinal adenopathy is likely reactive in nature. Aortic Atherosclerosis (ICD10-I70.0). Electronically Signed   By: Helyn Numbers M.D.   On: 05/30/2023 21:09    Principal Problem:   Ground glass opacity present on imaging of lung     Aaron Moss Aaron Moss, [facility]  If 7PM-7AM, please contact night-coverage [email or web address]   LOS: 1 day

## 2023-06-02 ENCOUNTER — Inpatient Hospital Stay: Payer: Medicare Other

## 2023-06-02 DIAGNOSIS — R918 Other nonspecific abnormal finding of lung field: Secondary | ICD-10-CM | POA: Diagnosis not present

## 2023-06-02 LAB — LEGIONELLA PNEUMOPHILA SEROGP 1 UR AG: L. pneumophila Serogp 1 Ur Ag: NEGATIVE

## 2023-06-02 MED ORDER — AMLODIPINE BESYLATE 5 MG PO TABS
5.0000 mg | ORAL_TABLET | Freq: Every day | ORAL | Status: DC
  Administered 2023-06-02 – 2023-06-03 (×2): 5 mg via ORAL
  Filled 2023-06-02 (×2): qty 1

## 2023-06-02 NOTE — Progress Notes (Signed)
 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  CT showed new groundglass opacities and reticular infiltrates with scattered consolidation.  Patient was noted to be COVID positive.   Subjective:  Patient admits to no appetite, notes he is trying to drink Ensure.  Admits to still feeling quite weak.   Objective: Vitals:   06/01/23 1100 06/01/23 1430 06/01/23 2123 06/02/23 0435  BP: (!) 160/69 (!) 165/70 (!) 172/80 (!) 165/64  Pulse: 87 85 (!) 108 78  Resp:  16 16 16   Temp:  97.7 F (36.5 C) 97.6 F (36.4 C) 97.9 F (36.6 C)  TempSrc:  Oral Oral Oral  SpO2:  99% 100% 100%  Weight:      Height:        Intake/Output Summary (Last 24 hours) at 06/02/2023 1712 Last data filed at 06/02/2023 1706 Gross per 24 hour  Intake 650 ml  Output 400 ml  Net 250 ml   Filed Weights   06/01/23 0942  Weight: 90 kg     Exam:  General: Listless man lying in bed in NAD no increased work of breathing Eyes: sclera anicteric, conjuctiva mild injection bilaterally CVS: S1-S2, regular  Respiratory:   Crackles in both mid lung field GI: NABS, soft, NT  LE: Warm and well-perfused Neuro: A/O x 3,  grossly nonfocal.  Psych: patient is logical and coherent, judgement and insight appear normal, mood and affect appropriate to situation.  Data Reviewed:  Basic Metabolic Panel: Recent Labs  Lab 05/29/23 1438 05/31/23 0812 06/01/23 0303  NA 139 137 138  K 4.3 4.3 3.6  CL 111 108 109  CO2 25 23 19*  GLUCOSE 125* 94 102*  BUN 13 15 13   CREATININE 0.96 1.00 0.86  CALCIUM 7.9* 8.5* 7.9*    CBC: Recent Labs  Lab 05/29/23 1438 05/31/23 0812 06/01/23 0303  WBC 16.0* 21.2* 21.4*  NEUTROABS 13.2* 18.1*  --   HGB 7.7* 8.3* 7.5*   HCT 26.1* 28.1* 26.2*  MCV 83.1 82.2 85.9  PLT 306 350 275     Scheduled Meds:  amLODipine  5 mg Oral Daily   benazepril  40 mg Oral Daily   carvedilol  12.5 mg Oral BID   Chlorhexidine Gluconate Cloth  6 each Topical Daily   DULoxetine  30 mg Oral Daily   enoxaparin (LOVENOX) injection  40 mg Subcutaneous Q24H   feeding supplement  237 mL Oral TID BM   iron polysaccharides  150 mg Oral BID   methylPREDNISolone (SOLU-MEDROL) injection  40 mg Intravenous Daily   nirmatrelvir/ritonavir  3 tablet Oral BID   sodium chloride flush  10-40 mL Intracatheter Q12H   Continuous Infusions:  azithromycin Stopped (06/01/23 2107)   ceFEPime (MAXIPIME) IV 2 g (06/02/23 1706)   vancomycin Stopped (06/01/23 1904)     Assessment & Plan:   Pneumonia secondary to COVID Groundglass opacities CAP, procalcitonin 0.14 COPD Appreciate PCCM consultation Continue treatment with cefepime, vancomycin and azithromycin Treatment of COVID pneumonia with steroids and Paxlovid day #2 today Inhaled bronchodilators as needed for COPD treatment, patient already on steroids Follow fever curve and leukocytosis  COVID asthenia Cancer cachexia Patient with apparent decreased p.o. intake for 1 month per wife They had thought it was secondary to cancer treatment Patient  states he will try to drink more Ensure and eat better Could consider Megace however data on Megace are not encouraging with minimal benefit and significant side effects including DVT Will discuss with patient's oncologist and start if they think it might be helpful  HTN Continue carvedilol and benazepril  Duodenal adenocarcinoma Outpatient oncology    DVT prophylaxis: Lovenox Code Status: Full Family Communication: Spoke with wife on phone     Studies: No results found.   Principal Problem:   Ground glass opacity present on imaging of lung Active Problems:   Pneumonia due to COVID-19 virus     Pieter Partridge, [facility]  If 7PM-7AM, please contact night-coverage [email or web address]   LOS: 2 days

## 2023-06-02 NOTE — Plan of Care (Signed)
  Problem: Education: Goal: Knowledge of General Education information will improve Description: Including pain rating scale, medication(s)/side effects and non-pharmacologic comfort measures Outcome: Progressing   Problem: Clinical Measurements: Goal: Ability to maintain clinical measurements within normal limits will improve Outcome: Progressing   Problem: Clinical Measurements: Goal: Will remain free from infection Outcome: Progressing   Problem: Clinical Measurements: Goal: Diagnostic test results will improve Outcome: Progressing   Problem: Clinical Measurements: Goal: Respiratory complications will improve Outcome: Progressing   Problem: Clinical Measurements: Goal: Cardiovascular complication will be avoided Outcome: Progressing   Problem: Nutrition: Goal: Adequate nutrition will be maintained Outcome: Progressing   Problem: Safety: Goal: Ability to remain free from injury will improve Outcome: Progressing   Problem: Skin Integrity: Goal: Risk for impaired skin integrity will decrease Outcome: Progressing   Problem: Respiratory: Goal: Will maintain a patent airway Outcome: Progressing

## 2023-06-02 NOTE — Plan of Care (Signed)
  Problem: Education: Goal: Knowledge of General Education information will improve Description: Including pain rating scale, medication(s)/side effects and non-pharmacologic comfort measures 06/02/2023 0703 by Glennon Mac, RN Outcome: Progressing 06/02/2023 0702 by Glennon Mac, RN Outcome: Progressing   Problem: Health Behavior/Discharge Planning: Goal: Ability to manage health-related needs will improve 06/02/2023 0703 by Glennon Mac, RN Outcome: Progressing 06/02/2023 0702 by Glennon Mac, RN Outcome: Progressing   Problem: Clinical Measurements: Goal: Ability to maintain clinical measurements within normal limits will improve 06/02/2023 0703 by Glennon Mac, RN Outcome: Progressing 06/02/2023 0702 by Glennon Mac, RN Outcome: Progressing Goal: Will remain free from infection 06/02/2023 0703 by Glennon Mac, RN Outcome: Progressing 06/02/2023 0702 by Glennon Mac, RN Outcome: Progressing Goal: Diagnostic test results will improve 06/02/2023 0703 by Glennon Mac, RN Outcome: Progressing 06/02/2023 0702 by Glennon Mac, RN Outcome: Progressing Goal: Respiratory complications will improve 06/02/2023 0703 by Glennon Mac, RN Outcome: Progressing 06/02/2023 0702 by Glennon Mac, RN Outcome: Progressing Goal: Cardiovascular complication will be avoided 06/02/2023 0703 by Glennon Mac, RN Outcome: Progressing 06/02/2023 0702 by Glennon Mac, RN Outcome: Progressing   Problem: Activity: Goal: Risk for activity intolerance will decrease 06/02/2023 0703 by Glennon Mac, RN Outcome: Progressing 06/02/2023 0702 by Glennon Mac, RN Outcome: Progressing   Problem: Nutrition: Goal: Adequate nutrition will be maintained 06/02/2023 0703 by Glennon Mac, RN Outcome: Progressing 06/02/2023 0702 by Glennon Mac, RN Outcome: Progressing   Problem: Coping: Goal: Level of anxiety will decrease 06/02/2023 0703 by Glennon Mac, RN Outcome:  Progressing 06/02/2023 0702 by Glennon Mac, RN Outcome: Progressing   Problem: Elimination: Goal: Will not experience complications related to bowel motility 06/02/2023 0703 by Glennon Mac, RN Outcome: Progressing 06/02/2023 0702 by Glennon Mac, RN Outcome: Progressing Goal: Will not experience complications related to urinary retention 06/02/2023 0703 by Glennon Mac, RN Outcome: Progressing 06/02/2023 0702 by Glennon Mac, RN Outcome: Progressing   Problem: Pain Managment: Goal: General experience of comfort will improve and/or be controlled 06/02/2023 0703 by Glennon Mac, RN Outcome: Progressing 06/02/2023 0702 by Glennon Mac, RN Outcome: Progressing   Problem: Safety: Goal: Ability to remain free from injury will improve 06/02/2023 0703 by Glennon Mac, RN Outcome: Progressing 06/02/2023 0702 by Glennon Mac, RN Outcome: Progressing   Problem: Skin Integrity: Goal: Risk for impaired skin integrity will decrease 06/02/2023 0703 by Glennon Mac, RN Outcome: Progressing 06/02/2023 0702 by Glennon Mac, RN Outcome: Progressing   Problem: Education: Goal: Knowledge of risk factors and measures for prevention of condition will improve 06/02/2023 0703 by Glennon Mac, RN Outcome: Progressing 06/02/2023 0702 by Glennon Mac, RN Outcome: Progressing   Problem: Coping: Goal: Psychosocial and spiritual needs will be supported 06/02/2023 0703 by Glennon Mac, RN Outcome: Progressing 06/02/2023 0702 by Glennon Mac, RN Outcome: Progressing   Problem: Respiratory: Goal: Will maintain a patent airway 06/02/2023 0703 by Glennon Mac, RN Outcome: Progressing 06/02/2023 0702 by Glennon Mac, RN Outcome: Progressing Goal: Complications related to the disease process, condition or treatment will be avoided or minimized 06/02/2023 0703 by Glennon Mac, RN Outcome: Progressing 06/02/2023 0702 by Glennon Mac, RN Outcome:  Progressing

## 2023-06-02 NOTE — Progress Notes (Signed)
 Occupational Therapy Treatment Patient Details Name: Aaron Moss MRN: [id number] DOB: 01/14/1946 Today's Date: 06/02/2023   History of present illness 78 y/o male who arrived at [facility] on 05/31/2023 due to for a month or so, he has had progressive worsening cancer related fatigue; admitted with covid. CT chest abdomen pelvis which showed concern for new groundglass opacities and is now being admitted to the hospital with continued weakness, anorexia, dyspnea and hypoxia with exertion. Pt with history of duodenal cancer with diagnostic laparoscopy, exploratory laparotomy, portal exploration, cholecystectomy, loop gastrojejunostomy completed in August 2023 at [facility]. PMH: GERD, HLD, HTN, Cx history, anemia.   OT comments  Patient reported that he has been taking himself to the bathroom. Nursing endorsed this as is documented independent ambulation today. Patient was provided with ECT and fall prevention handouts at this time with patient demonstrating understanding. Patient endorsed not needing further OT at this time. OT to sign off.       If plan is discharge home, recommend the following:  Assistance with cooking/housework;Assist for transportation;Help with stairs or ramp for entrance   Equipment Recommendations  Tub/shower seat    Recommendations for Other Services      Precautions / Restrictions Precautions Precautions: Fall Restrictions Weight Bearing Restrictions Per Provider Order: No              ADL either performed or assessed with clinical judgement   ADL           General ADL Comments: patient reported taking himself to the bathroom today and getting out of bed on his own. patient reproted he did not need further OT to work on ADLs. ECT adn falls risk goals were addressed with patient provided with 5 P's hand out and fall prevention strategies hand out from [employer]. patient verbalized and demonstrated understanding. patient endorsed not needing further OT at this  time.      Cognition Arousal: Alert Behavior During Therapy: WFL for tasks assessed/performed, Flat affect               OT - Cognition Comments: patient was plesant and cooperative during session.                                        Pertinent Vitals/ Pain       Pain Assessment Pain Assessment: No/denies pain         Frequency  Min 1X/week        Progress Toward Goals  OT Goals(current goals can now be found in the care plan section)  Progress towards OT goals: Goals met/education completed, patient discharged from OT     Plan         AM-PAC OT "6 Clicks" Daily Activity     Outcome Measure   Help from another person eating meals?: None Help from another person taking care of personal grooming?: A Little Help from another person toileting, which includes using toliet, bedpan, or urinal?: A Little Help from another person bathing (including washing, rinsing, drying)?: A Little Help from another person to put on and taking off regular upper body clothing?: A Little Help from another person to put on and taking off regular lower body clothing?: A Little 6 Click Score: 19    End of Session    OT Visit Diagnosis: History of falling (Z91.81);Unsteadiness on feet (R26.81)   Activity Tolerance Patient tolerated treatment well  Patient Left in bed;with call bell/phone within reach   Nurse Communication Other (comment) (ok to participate in session)        Time: 4098-1191 OT Time Calculation (min): 8 min  Charges: OT General Charges $OT Visit: 1 Visit OT Treatments $Self Care/Home Management : 8-22 mins  Rosalio Loud, MS Acute Rehabilitation Department Office# [phone]   Selinda Flavin 06/02/2023, 3:20 PM

## 2023-06-02 NOTE — Plan of Care (Signed)
  Problem: Education: Goal: Knowledge of General Education information will improve Description: Including pain rating scale, medication(s)/side effects and non-pharmacologic comfort measures Outcome: Progressing   Problem: Health Behavior/Discharge Planning: Goal: Ability to manage health-related needs will improve Outcome: Progressing   Problem: Clinical Measurements: Goal: Ability to maintain clinical measurements within normal limits will improve Outcome: Progressing Goal: Will remain free from infection Outcome: Progressing Goal: Diagnostic test results will improve Outcome: Progressing Goal: Respiratory complications will improve Outcome: Progressing Goal: Cardiovascular complication will be avoided Outcome: Progressing   Problem: Activity: Goal: Risk for activity intolerance will decrease Outcome: Progressing   Problem: Nutrition: Goal: Adequate nutrition will be maintained Outcome: Progressing   Problem: Coping: Goal: Level of anxiety will decrease Outcome: Progressing   Problem: Elimination: Goal: Will not experience complications related to bowel motility Outcome: Progressing Goal: Will not experience complications related to urinary retention Outcome: Progressing   Problem: Pain Managment: Goal: General experience of comfort will improve and/or be controlled Outcome: Progressing   Problem: Safety: Goal: Ability to remain free from injury will improve Outcome: Progressing   Problem: Skin Integrity: Goal: Risk for impaired skin integrity will decrease Outcome: Progressing   Problem: Education: Goal: Knowledge of risk factors and measures for prevention of condition will improve Outcome: Progressing   Problem: Coping: Goal: Psychosocial and spiritual needs will be supported Outcome: Progressing   Problem: Respiratory: Goal: Will maintain a patent airway Outcome: Progressing Goal: Complications related to the disease process, condition or treatment  will be avoided or minimized Outcome: Progressing

## 2023-06-03 ENCOUNTER — Encounter: Payer: Self-pay | Admitting: Hematology

## 2023-06-03 DIAGNOSIS — R918 Other nonspecific abnormal finding of lung field: Secondary | ICD-10-CM | POA: Diagnosis not present

## 2023-06-03 LAB — CBC
HCT: 23.6 % — ABNORMAL LOW (ref 39.0–52.0)
Hemoglobin: 7.1 g/dL — ABNORMAL LOW (ref 13.0–17.0)
MCH: 25.7 pg — ABNORMAL LOW (ref 26.0–34.0)
MCHC: 30.1 g/dL (ref 30.0–36.0)
MCV: 85.5 fL (ref 80.0–100.0)
Platelets: 266 10*3/uL (ref 150–400)
RBC: 2.76 MIL/uL — ABNORMAL LOW (ref 4.22–5.81)
RDW: 23 % — ABNORMAL HIGH (ref 11.5–15.5)
WBC: 27.7 10*3/uL — ABNORMAL HIGH (ref 4.0–10.5)
nRBC: 0.3 % — ABNORMAL HIGH (ref 0.0–0.2)

## 2023-06-03 LAB — BASIC METABOLIC PANEL
Anion gap: 6 (ref 5–15)
BUN: 15 mg/dL (ref 8–23)
CO2: 23 mmol/L (ref 22–32)
Calcium: 7.8 mg/dL — ABNORMAL LOW (ref 8.9–10.3)
Chloride: 108 mmol/L (ref 98–111)
Creatinine, Ser: 0.82 mg/dL (ref 0.61–1.24)
GFR, Estimated: >60 mL/min (ref >60)
Glucose, Bld: 117 mg/dL — ABNORMAL HIGH (ref 70–99)
Potassium: 3 mmol/L — ABNORMAL LOW (ref 3.5–5.1)
Sodium: 137 mmol/L (ref 135–145)

## 2023-06-03 MED ORDER — HEPARIN SOD (PORK) LOCK FLUSH 100 UNIT/ML IV SOLN
500.0000 [IU] | INTRAVENOUS | Status: AC | PRN
  Administered 2023-06-03: 500 [IU]
  Filled 2023-06-03: qty 5

## 2023-06-03 MED ORDER — ENSURE ENLIVE PO LIQD: 237.0000 mL | Freq: Three times a day (TID) | ORAL | 12 refills | Status: DC

## 2023-06-03 MED ORDER — PREDNISONE 20 MG PO TABS: 20.0000 mg | ORAL_TABLET | Freq: Every day | ORAL | 0 refills | Status: AC

## 2023-06-03 MED ORDER — POTASSIUM CHLORIDE CRYS ER 20 MEQ PO TBCR
40.0000 meq | EXTENDED_RELEASE_TABLET | ORAL | Status: DC
  Administered 2023-06-03: 40 meq via ORAL
  Filled 2023-06-03: qty 2

## 2023-06-03 MED ORDER — VANCOMYCIN HCL 1250 MG/250ML IV SOLN
1250.0000 mg | INTRAVENOUS | Status: DC
  Filled 2023-06-03: qty 250

## 2023-06-03 NOTE — Plan of Care (Signed)
 Patient and spouse verbalized understanding of discharge instructions.  Problem: Education: Goal: Knowledge of General Education information will improve Description: Including pain rating scale, medication(s)/side effects and non-pharmacologic comfort measures Outcome: Adequate for Discharge   Problem: Health Behavior/Discharge Planning: Goal: Ability to manage health-related needs will improve Outcome: Adequate for Discharge   Problem: Clinical Measurements: Goal: Ability to maintain clinical measurements within normal limits will improve Outcome: Adequate for Discharge Goal: Will remain free from infection Outcome: Adequate for Discharge Goal: Diagnostic test results will improve Outcome: Adequate for Discharge Goal: Respiratory complications will improve Outcome: Adequate for Discharge Goal: Cardiovascular complication will be avoided Outcome: Adequate for Discharge   Problem: Activity: Goal: Risk for activity intolerance will decrease Outcome: Adequate for Discharge   Problem: Nutrition: Goal: Adequate nutrition will be maintained Outcome: Adequate for Discharge   Problem: Coping: Goal: Level of anxiety will decrease Outcome: Adequate for Discharge   Problem: Elimination: Goal: Will not experience complications related to bowel motility Outcome: Adequate for Discharge Goal: Will not experience complications related to urinary retention Outcome: Adequate for Discharge   Problem: Pain Managment: Goal: General experience of comfort will improve and/or be controlled Outcome: Adequate for Discharge   Problem: Safety: Goal: Ability to remain free from injury will improve Outcome: Adequate for Discharge   Problem: Skin Integrity: Goal: Risk for impaired skin integrity will decrease Outcome: Adequate for Discharge   Problem: Education: Goal: Knowledge of risk factors and measures for prevention of condition will improve Outcome: Adequate for Discharge   Problem:  Coping: Goal: Psychosocial and spiritual needs will be supported Outcome: Adequate for Discharge   Problem: Respiratory: Goal: Will maintain a patent airway Outcome: Adequate for Discharge Goal: Complications related to the disease process, condition or treatment will be avoided or minimized Outcome: Adequate for Discharge

## 2023-06-03 NOTE — Plan of Care (Signed)
  Problem: Education: Goal: Knowledge of General Education information will improve Description: Including pain rating scale, medication(s)/side effects and non-pharmacologic comfort measures Outcome: Progressing   Problem: Health Behavior/Discharge Planning: Goal: Ability to manage health-related needs will improve Outcome: Progressing   Problem: Clinical Measurements: Goal: Ability to maintain clinical measurements within normal limits will improve Outcome: Progressing Goal: Will remain free from infection Outcome: Progressing Goal: Diagnostic test results will improve Outcome: Progressing Goal: Respiratory complications will improve Outcome: Progressing Goal: Cardiovascular complication will be avoided Outcome: Progressing   Problem: Activity: Goal: Risk for activity intolerance will decrease Outcome: Progressing   Problem: Nutrition: Goal: Adequate nutrition will be maintained Outcome: Progressing   Problem: Coping: Goal: Level of anxiety will decrease Outcome: Progressing   Problem: Elimination: Goal: Will not experience complications related to bowel motility Outcome: Progressing Goal: Will not experience complications related to urinary retention Outcome: Progressing   Problem: Pain Managment: Goal: General experience of comfort will improve and/or be controlled Outcome: Progressing   Problem: Safety: Goal: Ability to remain free from injury will improve Outcome: Progressing   Problem: Skin Integrity: Goal: Risk for impaired skin integrity will decrease Outcome: Progressing   Problem: Education: Goal: Knowledge of risk factors and measures for prevention of condition will improve Outcome: Progressing   Problem: Coping: Goal: Psychosocial and spiritual needs will be supported Outcome: Progressing   Problem: Respiratory: Goal: Will maintain a patent airway Outcome: Progressing Goal: Complications related to the disease process, condition or treatment  will be avoided or minimized Outcome: Progressing

## 2023-06-03 NOTE — Discharge Summary (Signed)
 Aaron Moss JWJ:[id number] DOB: Feb 10, 1946 DOA: 05/31/2023  PCP: Corwin Levins, MD  Admit date: 05/31/2023  Discharge date: 06/03/2023  Admitted From: Home   disposition: Home   Recommendations for Outpatient Follow-up:   Follow up with oncologist Dr. Mosetta Putt as scheduled to follow-up on outpatient palliative care consultation and referral to hospice.   Home Health: N/A Equipment/Devices: Manual wheelchair Consultations: Medical oncology, PCCM Discharge Condition: Fair CODE STATUS: DNR Diet Recommendation: Heart Healthy   Diet Order             Diet regular Fluid consistency: Thin  Diet effective now                    No chief complaint on file.    Brief history of present illness from the day of admission and additional interim summary    78 y.o. male with medical history significant for duodenal adenocarcinoma status post chemotherapy most recently due to some mild disease progression on FOLFIRI plan was to switch to immunotherapy due to poor tolerance of chemotherapy however has been off of therapy since 2/13 had recent outpatient CT chest abdomen pelvis which showed concern for new groundglass opacities and is now being admitted to the hospital with continued weakness, anorexia, dyspnea and hypoxia with exertion.  For the past month or so, he has had progressive worsening cancer related fatigue.  Has been nearly a month since he received his last immunotherapy but he is still quite fatigued.  History is provided by the patient as well as his wife who is at the bedside.  I also had a chance to speak over the phone this morning with his oncologist Dr. Parke Poisson.  He denies any fevers, chills, abdominal or chest pain.  He has had a intermittent cough productive of clear sputum, this has been going on for a couple  of months and has not worsened recently.  No dysuria, hemoptysis, lower extremity pain or calf swelling or pain.  He was seen in the office today, noted to be desaturating to 87% on ambulation on room air, with recovery back to 95% on room air after few minutes of rest.  He was also noted to have some dizziness with position changes.  He has also had incredibly poor appetite, some mild nausea without vomiting.  Patient states he is just not been eating because he does not have an appetite.  He barely takes in about 1 Ensure per day.  Due to this history, as well as CT scan findings below, he was directly admitted to the hospitalist service for expeditious workup and management.  Patient was noted to be COVID-positive.  Hospital Course   Patient was admitted for management of progressive decline and COVID-pneumonia with possible CAP as well.  Patient was placed on broad-spectrum antibiotics as well as Paxlovid and steroids.  Patient had no respiratory concerns while he was in house but he was very lethargic.  He stated he had no appetite at all.  Discussed importance of eating and patient stated that he was ready to be transitioned over to comfort care as had been discussed with him by his outpatient cardiologist Dr. Parke Poisson.  This was discussed with patient's wife and daughter and they were in agreement and requested to take patient home.  Patient and family did not wish to stay in house for palliative care consultation and hospice referral, noted they would be able to take care of this as an outpatient.    Discharge diagnosis     Principal Problem:   Ground glass opacity present on imaging of lung Active Problems:   Pneumonia due to COVID-19 virus    Discharge instructions    Discharge Instructions     Discharge instructions   Complete by: As directed    I have let Dr. Mosetta Putt know that you are going to want to pursue palliative  care and hospice as an outpatient.  I am continuing you on all your medications including your blood pressure medicines for now.  You can discuss with hospice which medications you do or do not want to take later.  It is important that you take potassium as before because your potassium level falls if you are not taking it and you need potassium for your heart function.  You will get the Paxlovid box that he started in the hospital, he can continue taking that until it is finished.  I am also giving him some prednisone to take for 3 days and you will need to pick that up at the [facility] pharmacy.  Mr. Azure should be masked when he is in the presence of other people for the next week.  When he is by himself or if he is with people who have a mask on he does not need to wear a mask.  If he is outside he does not need to wear a mask.       Discharge Medications   Allergies as of 06/03/2023       Reactions   Lyrica [pregabalin] Other (See Comments)   "Just made me feel bad"   Ciprofloxacin Itching, Rash        Medication List     STOP taking these medications    atorvastatin 40 MG tablet Commonly known as: LIPITOR   b complex vitamins capsule   dexamethasone 4 MG tablet Commonly known as: DECADRON   iron polysaccharides 150 MG capsule Commonly known as: NIFEREX   potassium chloride SA 20 MEQ tablet Commonly known as: Klor-Con M20   VITAMIN D PO       TAKE these medications    amLODipine 10 MG tablet Commonly known as: NORVASC TAKE 1 TABLET BY MOUTH ONCE DAILY   aspirin EC 81 MG tablet Take 1 tablet (81 mg total) by mouth daily. Swallow whole.   benazepril 40 MG tablet Commonly known as: LOTENSIN Take 1 tablet by mouth once daily   carvedilol 12.5 MG tablet Commonly known as: COREG TAKE 1 TABLET BY MOUTH TWICE A DAY   cyanocobalamin 500 MCG tablet Commonly known as: VITAMIN B12 Take 500 mcg by mouth daily.   diphenoxylate-atropine 2.5-0.025 MG tablet Commonly  known as: LOMOTIL Take 1-2 tablets by mouth 4 (four) times daily as needed for diarrhea or loose stools.   DULoxetine 30 MG capsule Commonly known as: CYMBALTA Take 1 capsule (30 mg total) by mouth daily.   feeding supplement Liqd Take 237 mLs by mouth 3 (three) times daily between meals.   potassium chloride 10 MEQ tablet Commonly known as: Klor-Con 10 2 tab by mouth three times per day What changed:  how much to take how to take this when to take this   predniSONE 20 MG tablet Commonly known as: DELTASONE Take 1 tablet (20 mg total) by mouth daily with breakfast for 3 days.   prochlorperazine 10 MG tablet Commonly known as: COMPAZINE Take 1 tablet (10 mg total) by mouth every 6 (six) hours as needed for nausea or vomiting (Use for nausea and / or vomiting unresolved with ondansetron (Zofran).).   traMADol 50 MG tablet Commonly known as: ULTRAM TAKE 1 TABLET BY MOUTH EVERY 12 HOURS AS NEEDED What changed: reasons to take this   Travoprost (BAK Free) 0.004 % Soln ophthalmic solution Commonly known as: TRAVATAN Place 1 drop into both eyes at bedtime.               Durable Medical Equipment  (From admission, onward)           Start     Ordered   06/03/23 1319  For home use only DME standard manual wheelchair with seat cushion  Once       Comments: Patient suffers from cancer which impairs their ability to perform daily activities like bathing in the home.  A walker will not resolve issue with performing activities of daily living. A wheelchair will allow patient to safely perform daily activities. Patient can safely propel the wheelchair in the home or has a caregiver who can provide assistance. Length of need 6 months . Accessories: elevating leg rests (ELRs), wheel locks, extensions and anti-tippers.   06/03/23 1318   06/03/23 1319  For home use only DME wheelchair cushion (seat and back)  Once        06/03/23 1318              Major procedures and  Radiology Reports - PLEASE review detailed and final reports thoroughly  -       CT CHEST ABDOMEN PELVIS W CONTRAST Result Date: 05/30/2023 CLINICAL DATA:  Colon cancer, adenocarcinoma of the duodenum, assess treatment response. * Tracking Code: BO * EXAM: CT CHEST, ABDOMEN, AND PELVIS WITH CONTRAST TECHNIQUE: Multidetector CT imaging of the chest, abdomen and pelvis was performed following the standard protocol during bolus administration of intravenous contrast. RADIATION DOSE REDUCTION: This exam was performed according to the departmental dose-optimization program which includes automated exposure control, adjustment of the mA and/or kV according to patient size and/or use of iterative reconstruction technique. CONTRAST:  30mL OMNIPAQUE IOHEXOL 300 MG/ML SOLN, OMNIPAQUE IOHEXOL 300 MG/ML SOLN COMPARISON:  04/04/2023, 12/04/2022 FINDINGS: CT CHEST FINDINGS Cardiovascular: Extensive multi-vessel coronary artery calcification. Global cardiac size within normal limits. No pericardial effusion. Left subclavian chest port catheter tip within the superior right atrium. Central pulmonary arteries are of normal caliber. Mild atherosclerotic calcification within the thoracic aorta. No aortic aneurysm. Mediastinum/Nodes: Visualized thyroid is unremarkable. Progressive shotty mediastinal adenopathy, most evident within the left pre-vascular lymph node group where index lymph nodes measure 9-10 mm in short axis diameter, progressively enlarged since both prior examinations. These are nonspecific, and may be reactive in nature given  the additional findings described below. The esophagus is unremarkable. Lungs/Pleura: Extensive ground-glass and reticular infiltrates and scattered areas of consolidation have developed within the lungs bilaterally, asymmetrically demonstrating no zonal predilection. This is nonspecific and may relate to multifocal infection in the acute setting. Drug reaction in the setting of  immunotherapy, however, could appear similarly. No pneumothorax or pleural effusion. No central obstructing lesion. Musculoskeletal: No acute bone abnormality. No lytic or blastic bone lesion. Intramuscular lipoma within the right infraspinatus again noted. CT ABDOMEN PELVIS FINDINGS Hepatobiliary: Stable tiny cyst within segment 4 B. liver otherwise unremarkable. Status post cholecystectomy. Stable mild proximal extrahepatic biliary ductal dilation secondary to mass effect upon the extrahepatic bile best seen on axial image # 54/4 by the primary malignancy. Pancreas: There is progressive infiltration of the proximal body and head of the pancreas by the duodenal malignancy. Obstruction the main pancreatic duct with pancreatic ductal dilation involving the distal body and tail of pancreas again identified with progressive parenchymal atrophy. No superimposed peripancreatic inflammatory change. Spleen: Unremarkable Adrenals/Urinary Tract: Adrenal glands are unremarkable. Simple cortical cysts are seen within the kidneys bilaterally for which no follow-up imaging is recommended. The kidneys are otherwise unremarkable. Bladder unremarkable. Stomach/Bowel: Partial transverse colectomy with anastomotic staple line again identified within the distal transverse colon. Small epigastric ventral hernia containing a single loop of unremarkable small bowel. Mild pancolonic diverticulosis again noted. The infiltrative malignant mass involving the gastric antrum and proximal duodenum in keeping with the patient's known primary duodenal adenocarcinoma is difficult to discretely measure given its infiltrative nature, however, appears grossly stable when measured in similar conglomerate fashion measuring 6.3 x 10.1 cm at axial image # 56/2. As noted above, there is increasing infiltration of the mass into the porta hepatis and head of the pancreas. Shotty peripancreatic lymph nodes are again identified suspicious for nodal metastases  measuring 10 mm in short axis diameter, unchanged. The duodenal lumen appears nearly obliterated, however, there is no significant gastric distension to suggest gastric outlet obstruction at this time. There is increasing infiltration of the mass into the transverse mesocolon, best appreciated on image # 61/2. Stomach, small bowel, and large bowel are otherwise unremarkable. Appendix normal. No free intraperitoneal gas or fluid. Vascular/Lymphatic: Aortic atherosclerosis. No additional pathologic adenopathy within the abdomen and pelvis. Reproductive: Fiducial markers are seen within the prostate gland. The prostate gland appears relatively atrophic. Other: None significant Musculoskeletal: Degenerative changes seen within the lumbar spine. No acute bone abnormality. No lytic or blastic bone lesion. IMPRESSION: 1. Grossly technically stable infiltrative malignant mass involving the gastric antrum and proximal duodenum in keeping with the patient's known primary duodenal adenocarcinoma. Subtle increasing infiltration of the mass into the porta hepatis, head of the pancreas, and transverse mesocolon. No significant gastric distension to suggest gastric outlet obstruction at this time. 2. Stable shotty peripancreatic lymph nodes suspicious for nodal metastases. 3. Stable biliary and pancreatic ductal obstruction with mild proximal intrahepatic biliary ductal dilation but no intrahepatic biliary dilation at this time. Progressive atrophy of the mid body and tail the pancreas with stable marked ductal dilation in keeping with occlusion of the main pancreatic duct by the malignant mass. 4. Extensive multi-vessel coronary artery calcification. 5. Extensive ground-glass and reticular infiltrates and scattered areas of consolidation have developed within the lungs bilaterally, asymmetrically demonstrating no zonal predilection. This is nonspecific and may relate to multifocal infection in the acute setting. Drug reaction in  the setting of immunotherapy, however, could appear similarly. Associated shotty mediastinal adenopathy is likely  reactive in nature. Aortic Atherosclerosis (ICD10-I70.0). Electronically Signed   By: Helyn Numbers M.D.   On: 05/30/2023 21:09    Micro Results   Recent Results (from the past 240 hours)  Resp panel by RT-PCR (RSV, Flu A&B, Covid) Anterior Nasal Swab     Status: Abnormal   Collection Time: 05/31/23  3:53 PM   Specimen: Anterior Nasal Swab  Result Value Ref Range Status   SARS Coronavirus 2 by RT PCR POSITIVE (A) NEGATIVE Final    Comment: (NOTE) SARS-CoV-2 target nucleic acids are DETECTED.  The SARS-CoV-2 RNA is generally detectable in upper respiratory specimens during the acute phase of infection. Positive results are indicative of the presence of the identified virus, but do not rule out bacterial infection or co-infection with other pathogens not detected by the test. Clinical correlation with patient history and other diagnostic information is necessary to determine patient infection status. The expected result is Negative.  Fact Sheet for Patients: BloggerCourse.com  Fact Sheet for Healthcare Providers: SeriousBroker.it  This test is not yet approved or cleared by the [place] FDA and  has been authorized for detection and/or diagnosis of SARS-CoV-2 by FDA under an Emergency Use Authorization (EUA).  This EUA will remain in effect (meaning this test can be used) for the duration of  the COVID-19 declaration under Section 564(b)(1) of the A ct, 21 U.S.C. section 360bbb-3(b)(1), unless the authorization is terminated or revoked sooner.     Influenza A by PCR NEGATIVE NEGATIVE Final   Influenza B by PCR NEGATIVE NEGATIVE Final    Comment: (NOTE) The Xpert Xpress SARS-CoV-2/FLU/RSV plus assay is intended as an aid in the diagnosis of influenza from Nasopharyngeal swab specimens and should not be used as a  sole basis for treatment. Nasal washings and aspirates are unacceptable for Xpert Xpress SARS-CoV-2/FLU/RSV testing.  Fact Sheet for Patients: BloggerCourse.com  Fact Sheet for Healthcare Providers: SeriousBroker.it  This test is not yet approved or cleared by the [place] FDA and has been authorized for detection and/or diagnosis of SARS-CoV-2 by FDA under an Emergency Use Authorization (EUA). This EUA will remain in effect (meaning this test can be used) for the duration of the COVID-19 declaration under Section 564(b)(1) of the Act, 21 U.S.C. section 360bbb-3(b)(1), unless the authorization is terminated or revoked.     Resp Syncytial Virus by PCR NEGATIVE NEGATIVE Final    Comment: (NOTE) Fact Sheet for Patients: BloggerCourse.com  Fact Sheet for Healthcare Providers: SeriousBroker.it  This test is not yet approved or cleared by the [place] FDA and has been authorized for detection and/or diagnosis of SARS-CoV-2 by FDA under an Emergency Use Authorization (EUA). This EUA will remain in effect (meaning this test can be used) for the duration of the COVID-19 declaration under Section 564(b)(1) of the Act, 21 U.S.C. section 360bbb-3(b)(1), unless the authorization is terminated or revoked.  Performed at [facility], [address], [place], [place] [address]   MRSA Next Gen by PCR, Nasal     Status: None   Collection Time: 05/31/23  6:00 PM   Specimen: Nasal Mucosa; Nasal Swab  Result Value Ref Range Status   MRSA by PCR Next Gen NOT DETECTED NOT DETECTED Final    Comment: (NOTE) The GeneXpert MRSA Assay (FDA approved for NASAL specimens only), is one component of a comprehensive MRSA colonization surveillance program. It is not intended to diagnose MRSA infection nor to guide or monitor treatment for MRSA infections. Test performance is not  FDA approved in patients less than 49 years old. Performed at [facility], [address] [place], [place] [address]     Today   Subjective    Aaron Moss was appreciative of the care he got here but no longer wishes to stay in the hospital.  Requesting to go home and follow-up with outpatient palliative care and referral to hospice.  Family at bedside is agreeable.   Objective   Blood pressure (!) 160/75, pulse 74, temperature 98.3 F (36.8 C), temperature source Oral, resp. rate 16, height 5\' 6"  (1.676 m), weight 90 kg, SpO2 100%.   Intake/Output Summary (Last 24 hours) at 06/03/2023 1325 Last data filed at 06/03/2023 0500 Gross per 24 hour  Intake 240 ml  Output 1100 ml  Net -860 ml    Exam General: Patient appears tired and listless, lying in bed in no acute distress with attentive wife and daughter at bedside.  Eyes: sclera anicteric, conjuctiva mild injection bilaterally CVS: S1-S2, regular  Respiratory:  decreased air entry bilaterally secondary to decreased inspiratory effort, rales at bases  GI: NABS, soft, NT  LE: No edema.    Data Review   CBC w Diff:  Lab Results  Component Value Date   WBC 27.7 (H) 06/03/2023   HGB 7.1 (L) 06/03/2023   HGB 8.3 (L) 05/31/2023   HCT 23.6 (L) 06/03/2023   PLT 266 06/03/2023   PLT 350 05/31/2023   LYMPHOPCT 6 05/31/2023   MONOPCT 6 05/31/2023   EOSPCT 1 05/31/2023   BASOPCT 0 05/31/2023    CMP:  Lab Results  Component Value Date   NA 137 06/03/2023   K 3.0 (L) 06/03/2023   CL 108 06/03/2023   CO2 23 06/03/2023   BUN 15 06/03/2023   CREATININE 0.82 06/03/2023   CREATININE 1.00 05/31/2023   PROT 6.5 05/31/2023   ALBUMIN 1.9 (L) 05/31/2023   BILITOT 1.2 05/31/2023   ALKPHOS 76 05/31/2023   AST 36 05/31/2023   ALT 30 05/31/2023  .   Total Time in preparing paper work, data evaluation and todays exam - 35 minutes  Pieter Partridge M.D on 06/03/2023 at 1:25 PM  [facility]

## 2023-06-03 NOTE — Progress Notes (Signed)
 Pharmacy Antibiotic Note  Aaron Moss is a 78 y.o. male admitted on 05/31/2023 with weakness, anorexia, dyspnea and hypoxia with exertion.  Pharmacy has been consulted for Cefepime and vancomycin dosing for pneumonia.   Plan: Continue cefepime 2 g IV q8h Increase vancomycin to 1250 mg IV q24h Follow up renal function, culture results, and clinical course for dose adjustments and de-escalation as indicated   Height: 5\' 6"  (167.6 cm) Weight: 90 kg (198 lb 6.6 oz) IBW/kg (Calculated) : 63.8  Temp (24hrs), Avg:98.3 F (36.8 C), Min:98.1 F (36.7 C), Max:98.7 F (37.1 C)  Recent Labs  Lab 05/29/23 1438 05/31/23 0812 06/01/23 0303 06/03/23 0301  WBC 16.0* 21.2* 21.4* 27.7*  CREATININE 0.96 1.00 0.86 0.82    Estimated Creatinine Clearance: 76.8 mL/min (by C-G formula based on SCr of 0.82 mg/dL).    Allergies  Allergen Reactions   Lyrica [Pregabalin] Other (See Comments)    "Just made me feel bad"   Ciprofloxacin Itching and Rash    Antimicrobials this admission: 3/13 Cefepime >> 3/13 Vancomycin >>   Microbiology results: 3/13 Resp: SARS Coronavirus 2 positive 3/13 MRSA PCR: negative  Thank you for allowing pharmacy to be a part of this patient's care.  Pricilla Riffle, PharmD, BCPS Clinical Pharmacist 06/03/2023 9:19 AM

## 2023-06-03 NOTE — TOC Transition Note (Signed)
 Transition of Care [facility]) - Discharge Note   Patient Details  Name: Aaron Moss MRN: [id number] Date of Birth: 1945/03/26  Transition of [facility]) CM/SW Contact:  Diona Browner, LCSW Phone Number: 06/03/2023, 2:30 PM   Clinical Narrative:    Pt d/c home with family. Pt requesting wheelchair. Wheelchair ordered through [employer] and delivered to room. No additional TOC needs   Final next level of care: Home/Self Care Barriers to Discharge: No Barriers Identified   Patient Goals and CMS Choice Patient states their goals for this hospitalization and ongoing recovery are:: return home CMS Medicare.gov Compare Post Acute Care list provided to::  (NA) Choice offered to / list presented to : NA [facility] ownership interest in [facility].provided to::  (NA)    Discharge Placement                       Discharge Plan and Services Additional resources added to the After Visit Summary for                  DME Arranged: Wheelchair manual DME Agency: [employer] Date DME Agency Contacted: 06/03/23 Time DME Agency Contacted: 1400 Representative spoke with at DME Agency: Vaughan Basta HH Arranged: NA HH Agency: NA        Social Drivers of Health (SDOH) Interventions SDOH Screenings   Food Insecurity: No Food Insecurity (05/31/2023)  Housing: Low Risk  (05/31/2023)  Transportation Needs: No Transportation Needs (05/31/2023)  Utilities: Not At Risk (05/31/2023)  Alcohol Screen: Low Risk  (10/19/2022)  Depression (PHQ2-9): Low Risk  (05/01/2023)  Financial Resource Strain: Low Risk  (10/19/2022)  Physical Activity: Inactive (10/19/2022)  Social Connections: Moderately Isolated (05/31/2023)  Stress: No Stress Concern Present (10/19/2022)  Tobacco Use: Medium Risk (05/31/2023)  Health Literacy: Adequate Health Literacy (10/19/2022)     Readmission Risk Interventions    06/03/2023    2:29 PM  Readmission Risk Prevention Plan  Transportation Screening Complete   PCP or Specialist Appt within 5-7 Days Complete  Home Care Screening Complete  Medication Review (RN CM) Complete

## 2023-06-04 ENCOUNTER — Encounter: Payer: Self-pay | Admitting: Hematology

## 2023-06-04 ENCOUNTER — Other Ambulatory Visit: Payer: Self-pay

## 2023-06-04 DIAGNOSIS — D500 Iron deficiency anemia secondary to blood loss (chronic): Secondary | ICD-10-CM

## 2023-06-04 DIAGNOSIS — C170 Malignant neoplasm of duodenum: Secondary | ICD-10-CM

## 2023-06-04 NOTE — Progress Notes (Signed)
 Verbal order w/readback from Dr. Mosetta Putt for hospice referral to [facility].  Dr. Mosetta Putt stated to have Hospice to contact pt's daughter April to scheduled the admissions appt.  Order placed in EPIC and admissions packet faxed to [facility].  Fax confirmation received.

## 2023-06-18 ENCOUNTER — Other Ambulatory Visit: Payer: Self-pay | Admitting: Nurse Practitioner

## 2023-06-26 ENCOUNTER — Encounter: Payer: Self-pay | Admitting: Genetic Counselor

## 2023-07-19 DEATH — deceased

## 2023-07-25 ENCOUNTER — Other Ambulatory Visit: Payer: Self-pay

## 2023-09-10 ENCOUNTER — Other Ambulatory Visit: Payer: Self-pay

## 2024-04-22 ENCOUNTER — Other Ambulatory Visit: Payer: Self-pay
